# Patient Record
Sex: Female | Born: 1955 | Race: White | Hispanic: No | Marital: Married | Smoking: Current some day smoker
Health system: Southern US, Community
[De-identification: ages and names within clinical notes are randomized; demographics above are authoritative.]

## PROBLEM LIST (undated history)

## (undated) DIAGNOSIS — F419 Anxiety disorder, unspecified: Secondary | ICD-10-CM

## (undated) DIAGNOSIS — H8190 Unspecified disorder of vestibular function, unspecified ear: Secondary | ICD-10-CM

## (undated) DIAGNOSIS — I10 Essential (primary) hypertension: Secondary | ICD-10-CM

## (undated) DIAGNOSIS — F988 Other specified behavioral and emotional disorders with onset usually occurring in childhood and adolescence: Secondary | ICD-10-CM

## (undated) DIAGNOSIS — Z9989 Dependence on other enabling machines and devices: Secondary | ICD-10-CM

## (undated) DIAGNOSIS — K219 Gastro-esophageal reflux disease without esophagitis: Secondary | ICD-10-CM

## (undated) DIAGNOSIS — G4730 Sleep apnea, unspecified: Secondary | ICD-10-CM

## (undated) DIAGNOSIS — Z973 Presence of spectacles and contact lenses: Secondary | ICD-10-CM

## (undated) DIAGNOSIS — Z972 Presence of dental prosthetic device (complete) (partial): Secondary | ICD-10-CM

## (undated) DIAGNOSIS — E669 Obesity, unspecified: Secondary | ICD-10-CM

## (undated) DIAGNOSIS — F329 Major depressive disorder, single episode, unspecified: Secondary | ICD-10-CM

## (undated) DIAGNOSIS — F0781 Postconcussional syndrome: Secondary | ICD-10-CM

## (undated) DIAGNOSIS — H8109 Meniere's disease, unspecified ear: Secondary | ICD-10-CM

## (undated) DIAGNOSIS — T7840XA Allergy, unspecified, initial encounter: Secondary | ICD-10-CM

## (undated) DIAGNOSIS — R7611 Nonspecific reaction to tuberculin skin test without active tuberculosis: Secondary | ICD-10-CM

## (undated) DIAGNOSIS — K5904 Chronic idiopathic constipation: Secondary | ICD-10-CM

## (undated) DIAGNOSIS — F319 Bipolar disorder, unspecified: Secondary | ICD-10-CM

## (undated) DIAGNOSIS — F1190 Opioid use, unspecified, uncomplicated: Secondary | ICD-10-CM

## (undated) DIAGNOSIS — M1990 Unspecified osteoarthritis, unspecified site: Secondary | ICD-10-CM

## (undated) DIAGNOSIS — R1013 Epigastric pain: Secondary | ICD-10-CM

## (undated) DIAGNOSIS — Z860100 Personal history of colon polyps, unspecified: Secondary | ICD-10-CM

## (undated) DIAGNOSIS — R413 Other amnesia: Secondary | ICD-10-CM

## (undated) DIAGNOSIS — E785 Hyperlipidemia, unspecified: Secondary | ICD-10-CM

## (undated) DIAGNOSIS — G510 Bell's palsy: Secondary | ICD-10-CM

## (undated) DIAGNOSIS — F32A Depression, unspecified: Secondary | ICD-10-CM

## (undated) HISTORY — DX: Epigastric pain: R10.13

## (undated) HISTORY — DX: Personal history of colon polyps, unspecified: Z86.0100

## (undated) HISTORY — DX: Nonspecific reaction to tuberculin skin test without active tuberculosis: R76.11

## (undated) HISTORY — DX: Gastro-esophageal reflux disease without esophagitis: K21.9

## (undated) HISTORY — DX: Sleep apnea, unspecified: G47.30

## (undated) HISTORY — DX: Hyperlipidemia, unspecified: E78.5

## (undated) HISTORY — DX: Major depressive disorder, single episode, unspecified: F32.9

## (undated) HISTORY — PX: CATARACT EXTRACTION, BILATERAL: SHX1313

## (undated) HISTORY — PX: UVULOPALATOPHARYNGOPLASTY: SHX827

## (undated) HISTORY — PX: MULTIPLE TOOTH EXTRACTIONS: SHX2053

## (undated) HISTORY — DX: Other amnesia: R41.3

## (undated) HISTORY — DX: Allergy, unspecified, initial encounter: T78.40XA

## (undated) HISTORY — DX: Personal history of colonic polyps: Z86.010

## (undated) HISTORY — DX: Chronic idiopathic constipation: K59.04

## (undated) HISTORY — DX: Obesity, unspecified: E66.9

## (undated) HISTORY — PX: ABDOMINAL HYSTERECTOMY: SHX81

## (undated) HISTORY — DX: Essential (primary) hypertension: I10

## (undated) HISTORY — DX: Postconcussional syndrome: F07.81

## (undated) HISTORY — DX: Depression, unspecified: F32.A

---
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
## 1979-06-19 HISTORY — PX: BREAST EXCISIONAL BIOPSY: SUR124

## 1979-06-19 HISTORY — PX: BREAST BIOPSY: SHX20

## 1995-05-19 HISTORY — PX: VAGINAL HYSTERECTOMY: SUR661

## 1998-01-19 ENCOUNTER — Other Ambulatory Visit: Admission: RE | Admit: 1998-01-19 | Discharge: 1998-01-19 | Payer: Self-pay | Admitting: Obstetrics and Gynecology

## 1998-01-27 ENCOUNTER — Ambulatory Visit (HOSPITAL_COMMUNITY): Admission: RE | Admit: 1998-01-27 | Discharge: 1998-01-27 | Payer: Self-pay | Admitting: Obstetrics and Gynecology

## 1999-08-31 ENCOUNTER — Encounter: Payer: Self-pay | Admitting: Obstetrics and Gynecology

## 1999-08-31 ENCOUNTER — Ambulatory Visit (HOSPITAL_COMMUNITY): Admission: RE | Admit: 1999-08-31 | Discharge: 1999-08-31 | Payer: Self-pay | Admitting: Obstetrics and Gynecology

## 1999-11-10 ENCOUNTER — Other Ambulatory Visit: Admission: RE | Admit: 1999-11-10 | Discharge: 1999-11-10 | Payer: Self-pay | Admitting: Obstetrics and Gynecology

## 2000-09-03 ENCOUNTER — Ambulatory Visit (HOSPITAL_COMMUNITY): Admission: RE | Admit: 2000-09-03 | Discharge: 2000-09-03 | Payer: Self-pay | Admitting: Obstetrics and Gynecology

## 2000-09-03 ENCOUNTER — Encounter: Payer: Self-pay | Admitting: Obstetrics and Gynecology

## 2000-10-17 ENCOUNTER — Ambulatory Visit (HOSPITAL_COMMUNITY): Admission: RE | Admit: 2000-10-17 | Discharge: 2000-10-17 | Payer: Self-pay | Admitting: Orthopedic Surgery

## 2000-10-17 ENCOUNTER — Encounter: Payer: Self-pay | Admitting: Orthopedic Surgery

## 2000-10-30 ENCOUNTER — Encounter: Payer: Self-pay | Admitting: Orthopedic Surgery

## 2000-10-30 ENCOUNTER — Encounter: Admission: RE | Admit: 2000-10-30 | Discharge: 2000-10-30 | Payer: Self-pay | Admitting: Orthopedic Surgery

## 2000-11-13 ENCOUNTER — Encounter: Payer: Self-pay | Admitting: Orthopedic Surgery

## 2000-11-13 ENCOUNTER — Encounter: Admission: RE | Admit: 2000-11-13 | Discharge: 2000-11-13 | Payer: Self-pay | Admitting: Orthopedic Surgery

## 2000-11-27 ENCOUNTER — Encounter: Admission: RE | Admit: 2000-11-27 | Discharge: 2000-11-27 | Payer: Self-pay | Admitting: Internal Medicine

## 2000-11-27 ENCOUNTER — Encounter: Payer: Self-pay | Admitting: Internal Medicine

## 2001-05-06 ENCOUNTER — Ambulatory Visit (HOSPITAL_COMMUNITY): Admission: RE | Admit: 2001-05-06 | Discharge: 2001-05-07 | Payer: Self-pay | Admitting: Neurosurgery

## 2001-05-06 ENCOUNTER — Encounter: Payer: Self-pay | Admitting: Neurosurgery

## 2001-06-18 HISTORY — PX: LUMBAR DISC SURGERY: SHX700

## 2003-02-02 ENCOUNTER — Other Ambulatory Visit: Admission: RE | Admit: 2003-02-02 | Discharge: 2003-02-02 | Payer: Self-pay | Admitting: Obstetrics and Gynecology

## 2003-05-27 ENCOUNTER — Ambulatory Visit (HOSPITAL_COMMUNITY): Admission: RE | Admit: 2003-05-27 | Discharge: 2003-05-27 | Payer: Self-pay | Admitting: Obstetrics and Gynecology

## 2003-11-27 ENCOUNTER — Ambulatory Visit (HOSPITAL_COMMUNITY): Admission: RE | Admit: 2003-11-27 | Discharge: 2003-11-27 | Payer: Self-pay | Admitting: Neurosurgery

## 2004-01-07 ENCOUNTER — Encounter: Admission: RE | Admit: 2004-01-07 | Discharge: 2004-01-07 | Payer: Self-pay | Admitting: Neurosurgery

## 2004-01-21 ENCOUNTER — Encounter: Admission: RE | Admit: 2004-01-21 | Discharge: 2004-01-21 | Payer: Self-pay | Admitting: Neurosurgery

## 2004-02-04 ENCOUNTER — Encounter: Admission: RE | Admit: 2004-02-04 | Discharge: 2004-02-04 | Payer: Self-pay | Admitting: Neurosurgery

## 2004-05-24 ENCOUNTER — Other Ambulatory Visit: Admission: RE | Admit: 2004-05-24 | Discharge: 2004-05-24 | Payer: Self-pay | Admitting: Obstetrics and Gynecology

## 2004-06-18 HISTORY — PX: ANTERIOR FUSION CERVICAL SPINE: SUR626

## 2004-08-09 ENCOUNTER — Encounter: Admission: RE | Admit: 2004-08-09 | Discharge: 2004-08-09 | Payer: Self-pay | Admitting: Obstetrics and Gynecology

## 2004-09-18 ENCOUNTER — Ambulatory Visit (HOSPITAL_COMMUNITY): Admission: RE | Admit: 2004-09-18 | Discharge: 2004-09-18 | Payer: Self-pay | Admitting: Neurosurgery

## 2004-10-19 ENCOUNTER — Encounter: Admission: RE | Admit: 2004-10-19 | Discharge: 2004-10-19 | Payer: Self-pay | Admitting: Neurosurgery

## 2004-11-24 ENCOUNTER — Encounter: Admission: RE | Admit: 2004-11-24 | Discharge: 2004-11-24 | Payer: Self-pay | Admitting: Neurosurgery

## 2005-03-01 ENCOUNTER — Encounter (INDEPENDENT_AMBULATORY_CARE_PROVIDER_SITE_OTHER): Payer: Self-pay | Admitting: *Deleted

## 2005-03-01 ENCOUNTER — Ambulatory Visit (HOSPITAL_COMMUNITY): Admission: RE | Admit: 2005-03-01 | Discharge: 2005-03-02 | Payer: Self-pay | Admitting: Otolaryngology

## 2005-03-27 ENCOUNTER — Encounter: Admission: RE | Admit: 2005-03-27 | Discharge: 2005-03-27 | Payer: Self-pay | Admitting: Neurosurgery

## 2005-08-31 ENCOUNTER — Ambulatory Visit (HOSPITAL_COMMUNITY): Admission: RE | Admit: 2005-08-31 | Discharge: 2005-08-31 | Payer: Self-pay | Admitting: *Deleted

## 2005-11-16 ENCOUNTER — Encounter: Admission: RE | Admit: 2005-11-16 | Discharge: 2005-11-16 | Payer: Self-pay | Admitting: Gastroenterology

## 2006-06-18 DIAGNOSIS — G510 Bell's palsy: Secondary | ICD-10-CM

## 2006-06-18 HISTORY — DX: Bell's palsy: G51.0

## 2006-09-05 ENCOUNTER — Ambulatory Visit (HOSPITAL_COMMUNITY): Admission: RE | Admit: 2006-09-05 | Discharge: 2006-09-05 | Payer: Self-pay | Admitting: Obstetrics & Gynecology

## 2006-11-05 ENCOUNTER — Encounter: Admission: RE | Admit: 2006-11-05 | Discharge: 2006-11-05 | Payer: Self-pay | Admitting: Neurosurgery

## 2007-04-04 ENCOUNTER — Ambulatory Visit: Payer: Self-pay | Admitting: Pulmonary Disease

## 2007-04-15 ENCOUNTER — Ambulatory Visit: Payer: Self-pay | Admitting: Pulmonary Disease

## 2007-04-15 DIAGNOSIS — E785 Hyperlipidemia, unspecified: Secondary | ICD-10-CM | POA: Insufficient documentation

## 2007-04-15 DIAGNOSIS — G4733 Obstructive sleep apnea (adult) (pediatric): Secondary | ICD-10-CM

## 2007-04-15 DIAGNOSIS — R519 Headache, unspecified: Secondary | ICD-10-CM | POA: Insufficient documentation

## 2007-04-15 DIAGNOSIS — R51 Headache: Secondary | ICD-10-CM

## 2007-04-15 DIAGNOSIS — G4730 Sleep apnea, unspecified: Secondary | ICD-10-CM | POA: Insufficient documentation

## 2007-04-15 HISTORY — DX: Obstructive sleep apnea (adult) (pediatric): G47.33

## 2007-04-15 HISTORY — DX: Headache, unspecified: R51.9

## 2007-04-15 LAB — CONVERTED CEMR LAB
Angiotensin 1 Converting Enzyme: 46 units/L (ref 9–67)
Anti Nuclear Antibody(ANA): NEGATIVE
IgE (Immunoglobulin E), Serum: 50.4 intl units/mL (ref 0.0–180.0)
Rhuematoid fact SerPl-aCnc: <20 intl units/mL — ABNORMAL LOW (ref 0.0–20.0)
Sed Rate: 38 mm/hr — ABNORMAL HIGH (ref 0–25)

## 2007-09-08 ENCOUNTER — Ambulatory Visit (HOSPITAL_COMMUNITY): Admission: RE | Admit: 2007-09-08 | Discharge: 2007-09-08 | Payer: Self-pay | Admitting: Obstetrics and Gynecology

## 2008-01-20 ENCOUNTER — Emergency Department (HOSPITAL_COMMUNITY): Admission: EM | Admit: 2008-01-20 | Discharge: 2008-01-20 | Payer: Self-pay | Admitting: Emergency Medicine

## 2008-01-23 ENCOUNTER — Ambulatory Visit: Payer: Self-pay | Admitting: Family Medicine

## 2008-01-23 DIAGNOSIS — K219 Gastro-esophageal reflux disease without esophagitis: Secondary | ICD-10-CM

## 2008-01-23 DIAGNOSIS — F411 Generalized anxiety disorder: Secondary | ICD-10-CM

## 2008-01-23 DIAGNOSIS — F419 Anxiety disorder, unspecified: Secondary | ICD-10-CM | POA: Insufficient documentation

## 2008-01-23 DIAGNOSIS — J309 Allergic rhinitis, unspecified: Secondary | ICD-10-CM | POA: Insufficient documentation

## 2008-01-23 DIAGNOSIS — R32 Unspecified urinary incontinence: Secondary | ICD-10-CM | POA: Insufficient documentation

## 2008-01-23 DIAGNOSIS — I10 Essential (primary) hypertension: Secondary | ICD-10-CM

## 2008-01-23 DIAGNOSIS — F329 Major depressive disorder, single episode, unspecified: Secondary | ICD-10-CM | POA: Insufficient documentation

## 2008-01-23 DIAGNOSIS — G4700 Insomnia, unspecified: Secondary | ICD-10-CM

## 2008-01-23 HISTORY — DX: Insomnia, unspecified: G47.00

## 2008-01-23 HISTORY — DX: Essential (primary) hypertension: I10

## 2008-01-23 HISTORY — DX: Major depressive disorder, single episode, unspecified: F32.9

## 2008-01-23 HISTORY — DX: Allergic rhinitis, unspecified: J30.9

## 2008-01-23 HISTORY — DX: Unspecified urinary incontinence: R32

## 2008-01-23 HISTORY — DX: Gastro-esophageal reflux disease without esophagitis: K21.9

## 2008-01-23 HISTORY — DX: Generalized anxiety disorder: F41.1

## 2008-01-26 ENCOUNTER — Telehealth: Payer: Self-pay | Admitting: Family Medicine

## 2008-01-27 ENCOUNTER — Telehealth: Payer: Self-pay | Admitting: Family Medicine

## 2008-01-28 ENCOUNTER — Emergency Department (HOSPITAL_COMMUNITY): Admission: EM | Admit: 2008-01-28 | Discharge: 2008-01-28 | Payer: Self-pay | Admitting: *Deleted

## 2008-01-29 ENCOUNTER — Inpatient Hospital Stay (HOSPITAL_COMMUNITY): Admission: AD | Admit: 2008-01-29 | Discharge: 2008-02-01 | Payer: Self-pay | Admitting: Family Medicine

## 2008-01-29 ENCOUNTER — Ambulatory Visit: Payer: Self-pay | Admitting: Family Medicine

## 2008-01-30 ENCOUNTER — Ambulatory Visit: Payer: Self-pay | Admitting: Internal Medicine

## 2008-02-04 ENCOUNTER — Telehealth: Payer: Self-pay | Admitting: Family Medicine

## 2008-02-06 ENCOUNTER — Ambulatory Visit: Payer: Self-pay | Admitting: Internal Medicine

## 2008-02-06 ENCOUNTER — Ambulatory Visit: Payer: Self-pay | Admitting: Family Medicine

## 2008-02-06 DIAGNOSIS — G510 Bell's palsy: Secondary | ICD-10-CM | POA: Insufficient documentation

## 2008-02-10 ENCOUNTER — Telehealth: Payer: Self-pay | Admitting: Family Medicine

## 2008-02-13 ENCOUNTER — Ambulatory Visit: Payer: Self-pay | Admitting: Family Medicine

## 2008-02-16 ENCOUNTER — Encounter: Payer: Self-pay | Admitting: Family Medicine

## 2008-02-16 ENCOUNTER — Telehealth: Payer: Self-pay | Admitting: Family Medicine

## 2008-02-27 ENCOUNTER — Ambulatory Visit: Payer: Self-pay | Admitting: Family Medicine

## 2008-03-03 ENCOUNTER — Encounter: Payer: Self-pay | Admitting: Family Medicine

## 2008-03-15 ENCOUNTER — Telehealth: Payer: Self-pay | Admitting: *Deleted

## 2008-03-25 ENCOUNTER — Ambulatory Visit: Payer: Self-pay | Admitting: Licensed Clinical Social Worker

## 2008-04-05 ENCOUNTER — Telehealth: Payer: Self-pay | Admitting: Family Medicine

## 2008-04-12 ENCOUNTER — Encounter: Payer: Self-pay | Admitting: Family Medicine

## 2008-04-12 ENCOUNTER — Telehealth: Payer: Self-pay | Admitting: Family Medicine

## 2008-04-13 ENCOUNTER — Telehealth: Payer: Self-pay | Admitting: Family Medicine

## 2008-04-15 ENCOUNTER — Telehealth: Payer: Self-pay | Admitting: Family Medicine

## 2008-04-16 ENCOUNTER — Telehealth: Payer: Self-pay | Admitting: Family Medicine

## 2008-04-16 ENCOUNTER — Ambulatory Visit: Payer: Self-pay | Admitting: Family Medicine

## 2008-04-16 DIAGNOSIS — R42 Dizziness and giddiness: Secondary | ICD-10-CM | POA: Insufficient documentation

## 2008-04-16 HISTORY — DX: Dizziness and giddiness: R42

## 2008-04-19 ENCOUNTER — Telehealth: Payer: Self-pay | Admitting: Family Medicine

## 2008-04-20 ENCOUNTER — Encounter: Payer: Self-pay | Admitting: Family Medicine

## 2008-04-21 ENCOUNTER — Encounter: Payer: Self-pay | Admitting: Family Medicine

## 2008-04-28 ENCOUNTER — Ambulatory Visit: Payer: Self-pay | Admitting: Licensed Clinical Social Worker

## 2008-05-03 ENCOUNTER — Telehealth: Payer: Self-pay | Admitting: Family Medicine

## 2008-05-03 ENCOUNTER — Encounter: Payer: Self-pay | Admitting: Family Medicine

## 2008-05-26 ENCOUNTER — Ambulatory Visit: Payer: Self-pay | Admitting: Licensed Clinical Social Worker

## 2008-05-26 ENCOUNTER — Encounter: Admission: RE | Admit: 2008-05-26 | Discharge: 2008-06-17 | Payer: Self-pay | Admitting: Neurology

## 2008-06-04 ENCOUNTER — Ambulatory Visit: Payer: Self-pay | Admitting: Licensed Clinical Social Worker

## 2008-06-16 ENCOUNTER — Ambulatory Visit: Payer: Self-pay | Admitting: Internal Medicine

## 2008-07-04 ENCOUNTER — Emergency Department: Payer: Self-pay | Admitting: Emergency Medicine

## 2008-07-06 ENCOUNTER — Ambulatory Visit: Payer: Self-pay | Admitting: Internal Medicine

## 2008-07-12 ENCOUNTER — Ambulatory Visit: Payer: Self-pay | Admitting: Family Medicine

## 2008-07-15 ENCOUNTER — Telehealth: Payer: Self-pay | Admitting: Family Medicine

## 2008-07-21 ENCOUNTER — Encounter: Payer: Self-pay | Admitting: Family Medicine

## 2008-07-22 ENCOUNTER — Emergency Department (HOSPITAL_COMMUNITY): Admission: EM | Admit: 2008-07-22 | Discharge: 2008-07-22 | Payer: Self-pay | Admitting: Emergency Medicine

## 2008-07-22 ENCOUNTER — Inpatient Hospital Stay (HOSPITAL_COMMUNITY): Admission: RE | Admit: 2008-07-22 | Discharge: 2008-07-26 | Payer: Self-pay | Admitting: *Deleted

## 2008-07-23 ENCOUNTER — Ambulatory Visit: Payer: Self-pay | Admitting: *Deleted

## 2008-08-02 ENCOUNTER — Encounter: Admission: RE | Admit: 2008-08-02 | Discharge: 2008-09-06 | Payer: Self-pay | Admitting: Family Medicine

## 2008-08-06 ENCOUNTER — Ambulatory Visit: Payer: Self-pay | Admitting: Family Medicine

## 2008-08-11 ENCOUNTER — Telehealth: Payer: Self-pay | Admitting: Family Medicine

## 2008-08-12 ENCOUNTER — Encounter: Payer: Self-pay | Admitting: Family Medicine

## 2008-08-13 ENCOUNTER — Telehealth: Payer: Self-pay | Admitting: Family Medicine

## 2008-08-27 ENCOUNTER — Telehealth: Payer: Self-pay | Admitting: Family Medicine

## 2008-09-06 ENCOUNTER — Telehealth: Payer: Self-pay | Admitting: Family Medicine

## 2008-09-06 ENCOUNTER — Ambulatory Visit: Payer: Self-pay | Admitting: Family Medicine

## 2008-10-04 ENCOUNTER — Ambulatory Visit: Payer: Self-pay | Admitting: Family Medicine

## 2008-10-04 DIAGNOSIS — L732 Hidradenitis suppurativa: Secondary | ICD-10-CM | POA: Insufficient documentation

## 2008-10-04 HISTORY — DX: Hidradenitis suppurativa: L73.2

## 2008-10-11 ENCOUNTER — Encounter: Payer: Self-pay | Admitting: Family Medicine

## 2008-11-09 ENCOUNTER — Telehealth: Payer: Self-pay | Admitting: Family Medicine

## 2008-11-12 ENCOUNTER — Ambulatory Visit: Payer: Self-pay | Admitting: Family Medicine

## 2008-11-24 ENCOUNTER — Telehealth: Payer: Self-pay | Admitting: Family Medicine

## 2009-01-04 ENCOUNTER — Ambulatory Visit (HOSPITAL_COMMUNITY): Admission: RE | Admit: 2009-01-04 | Discharge: 2009-01-04 | Payer: Self-pay | Admitting: Obstetrics and Gynecology

## 2009-01-13 ENCOUNTER — Telehealth: Payer: Self-pay | Admitting: Family Medicine

## 2009-01-21 ENCOUNTER — Ambulatory Visit: Payer: Self-pay | Admitting: Family Medicine

## 2009-01-21 DIAGNOSIS — M545 Low back pain, unspecified: Secondary | ICD-10-CM | POA: Insufficient documentation

## 2009-01-21 DIAGNOSIS — R609 Edema, unspecified: Secondary | ICD-10-CM

## 2009-01-21 HISTORY — DX: Edema, unspecified: R60.9

## 2009-01-21 HISTORY — DX: Low back pain, unspecified: M54.50

## 2009-02-10 ENCOUNTER — Encounter: Payer: Self-pay | Admitting: Family Medicine

## 2009-02-11 ENCOUNTER — Telehealth: Payer: Self-pay | Admitting: Family Medicine

## 2009-02-14 ENCOUNTER — Encounter: Admission: RE | Admit: 2009-02-14 | Discharge: 2009-02-14 | Payer: Self-pay | Admitting: Neurosurgery

## 2009-02-14 ENCOUNTER — Telehealth: Payer: Self-pay | Admitting: Family Medicine

## 2009-03-01 ENCOUNTER — Encounter: Admission: RE | Admit: 2009-03-01 | Discharge: 2009-03-01 | Payer: Self-pay | Admitting: Neurosurgery

## 2009-04-05 ENCOUNTER — Ambulatory Visit: Payer: Self-pay | Admitting: Family Medicine

## 2009-04-06 ENCOUNTER — Telehealth: Payer: Self-pay | Admitting: Family Medicine

## 2009-04-12 ENCOUNTER — Ambulatory Visit: Payer: Self-pay | Admitting: Family Medicine

## 2009-04-15 ENCOUNTER — Telehealth: Payer: Self-pay | Admitting: Family Medicine

## 2009-04-20 ENCOUNTER — Ambulatory Visit (HOSPITAL_COMMUNITY): Admission: RE | Admit: 2009-04-20 | Discharge: 2009-04-20 | Payer: Self-pay | Admitting: Psychiatry

## 2009-04-22 ENCOUNTER — Encounter: Payer: Self-pay | Admitting: Family Medicine

## 2009-05-10 ENCOUNTER — Ambulatory Visit: Payer: Self-pay | Admitting: Family Medicine

## 2009-05-10 LAB — CONVERTED CEMR LAB
Bilirubin Urine: NEGATIVE
Blood in Urine, dipstick: NEGATIVE
Glucose, Urine, Semiquant: NEGATIVE
Nitrite: NEGATIVE
Specific Gravity, Urine: 1.015
Urobilinogen, UA: 1
WBC Urine, dipstick: NEGATIVE
pH: 7

## 2009-05-11 LAB — CONVERTED CEMR LAB
ALT: 39 units/L — ABNORMAL HIGH (ref 0–35)
AST: 50 units/L — ABNORMAL HIGH (ref 0–37)
Albumin: 3.6 g/dL (ref 3.5–5.2)
Alkaline Phosphatase: 42 units/L (ref 39–117)
BUN: 7 mg/dL (ref 6–23)
Basophils Absolute: 0 10*3/uL (ref 0.0–0.1)
Basophils Relative: 0.8 % (ref 0.0–3.0)
Bilirubin, Direct: 0.1 mg/dL (ref 0.0–0.3)
CO2: 32 meq/L (ref 19–32)
Calcium: 9.3 mg/dL (ref 8.4–10.5)
Chloride: 106 meq/L (ref 96–112)
Cholesterol: 151 mg/dL (ref 0–200)
Creatinine, Ser: 0.6 mg/dL (ref 0.4–1.2)
Direct LDL: 98.8 mg/dL
Eosinophils Absolute: 0.1 10*3/uL (ref 0.0–0.7)
Eosinophils Relative: 3.3 % (ref 0.0–5.0)
GFR calc non Af Amer: 110.77 mL/min (ref >60)
Glucose, Bld: 130 mg/dL — ABNORMAL HIGH (ref 70–99)
HCT: 34.1 % — ABNORMAL LOW (ref 36.0–46.0)
HDL: 34.7 mg/dL — ABNORMAL LOW (ref >39.00)
Hemoglobin: 11.6 g/dL — ABNORMAL LOW (ref 12.0–15.0)
Lymphocytes Relative: 37.2 % (ref 12.0–46.0)
Lymphs Abs: 1.5 10*3/uL (ref 0.7–4.0)
MCHC: 34 g/dL (ref 30.0–36.0)
MCV: 92.6 fL (ref 78.0–100.0)
Monocytes Absolute: 0.4 10*3/uL (ref 0.1–1.0)
Monocytes Relative: 10.4 % (ref 3.0–12.0)
Neutro Abs: 2 10*3/uL (ref 1.4–7.7)
Neutrophils Relative %: 48.3 % (ref 43.0–77.0)
Platelets: 180 10*3/uL (ref 150.0–400.0)
Potassium: 3.7 meq/L (ref 3.5–5.1)
RBC: 3.68 M/uL — ABNORMAL LOW (ref 3.87–5.11)
RDW: 14.6 % (ref 11.5–14.6)
Sodium: 144 meq/L (ref 135–145)
TSH: 4.88 microintl units/mL (ref 0.35–5.50)
Total Bilirubin: 0.6 mg/dL (ref 0.3–1.2)
Total CHOL/HDL Ratio: 4
Total Protein: 7.3 g/dL (ref 6.0–8.3)
Triglycerides: 220 mg/dL — ABNORMAL HIGH (ref 0.0–149.0)
VLDL: 44 mg/dL — ABNORMAL HIGH (ref 0.0–40.0)
Valproic Acid Lvl: 108.4 ug/mL — ABNORMAL HIGH (ref 50.0–100.0)
Vitamin B-12: 510 pg/mL (ref 211–911)
WBC: 4 10*3/uL — ABNORMAL LOW (ref 4.5–10.5)

## 2009-05-16 ENCOUNTER — Encounter (INDEPENDENT_AMBULATORY_CARE_PROVIDER_SITE_OTHER): Payer: Self-pay | Admitting: *Deleted

## 2009-05-31 ENCOUNTER — Telehealth: Payer: Self-pay | Admitting: Family Medicine

## 2009-06-29 ENCOUNTER — Telehealth: Payer: Self-pay | Admitting: Family Medicine

## 2009-07-01 ENCOUNTER — Ambulatory Visit: Payer: Self-pay | Admitting: Family Medicine

## 2009-07-01 DIAGNOSIS — R413 Other amnesia: Secondary | ICD-10-CM | POA: Insufficient documentation

## 2009-07-08 ENCOUNTER — Telehealth: Payer: Self-pay | Admitting: Family Medicine

## 2009-07-11 ENCOUNTER — Telehealth: Payer: Self-pay | Admitting: Family Medicine

## 2009-07-13 ENCOUNTER — Ambulatory Visit: Payer: Self-pay | Admitting: Family Medicine

## 2009-07-14 ENCOUNTER — Encounter: Payer: Self-pay | Admitting: Family Medicine

## 2009-08-02 ENCOUNTER — Encounter: Admission: RE | Admit: 2009-08-02 | Discharge: 2009-10-31 | Payer: Self-pay | Admitting: Psychiatry

## 2009-08-23 ENCOUNTER — Telehealth: Payer: Self-pay | Admitting: Family Medicine

## 2009-09-08 ENCOUNTER — Encounter: Payer: Self-pay | Admitting: Family Medicine

## 2009-10-19 ENCOUNTER — Encounter: Payer: Self-pay | Admitting: Family Medicine

## 2009-12-09 ENCOUNTER — Telehealth: Payer: Self-pay | Admitting: Family Medicine

## 2009-12-13 ENCOUNTER — Ambulatory Visit: Payer: Self-pay | Admitting: Family Medicine

## 2009-12-13 DIAGNOSIS — F319 Bipolar disorder, unspecified: Secondary | ICD-10-CM | POA: Insufficient documentation

## 2009-12-13 HISTORY — DX: Bipolar disorder, unspecified: F31.9

## 2010-01-06 ENCOUNTER — Telehealth: Payer: Self-pay | Admitting: Family Medicine

## 2010-01-11 ENCOUNTER — Ambulatory Visit (HOSPITAL_COMMUNITY): Admission: RE | Admit: 2010-01-11 | Discharge: 2010-01-11 | Payer: Self-pay | Admitting: Obstetrics and Gynecology

## 2010-01-31 ENCOUNTER — Ambulatory Visit: Payer: Self-pay | Admitting: Family Medicine

## 2010-02-09 ENCOUNTER — Telehealth: Payer: Self-pay | Admitting: Family Medicine

## 2010-02-13 ENCOUNTER — Encounter: Payer: Self-pay | Admitting: Family Medicine

## 2010-02-24 ENCOUNTER — Encounter: Payer: Self-pay | Admitting: Family Medicine

## 2010-02-27 ENCOUNTER — Ambulatory Visit: Payer: Self-pay | Admitting: Family Medicine

## 2010-02-28 LAB — CONVERTED CEMR LAB
Amphetamine Screen, Ur: NEGATIVE
Barbiturate Quant, Ur: NEGATIVE
Benzodiazepines.: NEGATIVE
Cocaine Metabolites: NEGATIVE
Creatinine,U: 92 mg/dL
Marijuana Metabolite: NEGATIVE
Methadone: NEGATIVE
Opiate Screen, Urine: NEGATIVE
Phencyclidine (PCP): NEGATIVE
Propoxyphene: NEGATIVE

## 2010-03-23 ENCOUNTER — Ambulatory Visit: Payer: Self-pay | Admitting: Family Medicine

## 2010-04-13 ENCOUNTER — Encounter: Admission: RE | Admit: 2010-04-13 | Discharge: 2010-04-13 | Payer: Self-pay | Admitting: Otolaryngology

## 2010-04-13 ENCOUNTER — Encounter: Payer: Self-pay | Admitting: Family Medicine

## 2010-04-17 ENCOUNTER — Ambulatory Visit: Payer: Self-pay | Admitting: Family Medicine

## 2010-06-23 ENCOUNTER — Telehealth: Payer: Self-pay | Admitting: Family Medicine

## 2010-07-04 ENCOUNTER — Telehealth: Payer: Self-pay | Admitting: Family Medicine

## 2010-07-09 ENCOUNTER — Encounter: Payer: Self-pay | Admitting: Obstetrics and Gynecology

## 2010-07-09 ENCOUNTER — Encounter: Payer: Self-pay | Admitting: Neurosurgery

## 2010-07-12 ENCOUNTER — Ambulatory Visit
Admission: RE | Admit: 2010-07-12 | Discharge: 2010-07-12 | Payer: Self-pay | Source: Home / Self Care | Attending: Family Medicine | Admitting: Family Medicine

## 2010-07-13 ENCOUNTER — Encounter
Admission: RE | Admit: 2010-07-13 | Discharge: 2010-07-13 | Payer: Self-pay | Source: Home / Self Care | Attending: Neurosurgery | Admitting: Neurosurgery

## 2010-07-17 ENCOUNTER — Encounter
Admission: RE | Admit: 2010-07-17 | Discharge: 2010-07-17 | Payer: Self-pay | Source: Home / Self Care | Attending: Neurosurgery | Admitting: Neurosurgery

## 2010-07-19 ENCOUNTER — Other Ambulatory Visit: Payer: Self-pay | Admitting: Neurosurgery

## 2010-07-19 DIAGNOSIS — M7918 Myalgia, other site: Secondary | ICD-10-CM

## 2010-07-19 DIAGNOSIS — M545 Low back pain, unspecified: Secondary | ICD-10-CM

## 2010-07-19 DIAGNOSIS — M25551 Pain in right hip: Secondary | ICD-10-CM

## 2010-07-19 DIAGNOSIS — M25552 Pain in left hip: Secondary | ICD-10-CM

## 2010-07-20 NOTE — Assessment & Plan Note (Signed)
Summary: continued problems w vertigo   Vital Signs:  Patient Profile:   55 Years Old Female Height:     62.5 inches Weight:      166 pounds Temp:     97.5 degrees F oral BP sitting:   114 / 74  (left arm) Cuff size:   regular  Vitals Entered By: Alfred Levins, CMA (August 06, 2008 11:07 AM)                 Chief Complaint:  f/u.  History of Present Illness: Here to follow up for chronic dizziness. She saw Dr. Gentry Roch, a neuro-ophthalmologist at [facility], on 07-21-08 for her reports of diplopia. Her exam was normal. he did not have much to offer, but did raise the outside posssibilty of ocular mysthenia gravis. He then turned her care back over to Dr. Anne Hahn. After I saw her last month, we refered her to PT for vestibular rehab therapy. She has only had 2 sessions so far, so of course there has not been much improvement. She remains out of work and still does not drive. Her daughter drove her today. By far the major problem she is dealing with now is the chronic dizziness. By her description this seems to be more from a central CNS source than from a vestibular source, but nothing specific has been found as of yet. Her depression is much better lately, and she is pleased with her current regimen of meds. She would like to have me prescribe her psychiatric meds from now on, since she does not plan on seeing Dr. Donell Beers any more. She continues with regular psychotherapy as well.     Current Allergies: SULFA  Past Medical History:    Reviewed history from 04/16/2008 and no changes required:       Hyperlipidemia       Allergic rhinitis       depression and anxiety, sees Dr. Donell Beers       GERD       Headache       Hypertension       Positive PPD       Urinary incontinence       sleep apnea, sees Dr. Shelle Iron       sees Dr. Tresa Res for gyn exams       sees Berniece Andreas for therapy       sees Dr. Anne Hahn for dizziness, left sided hearing loss, and left sided head pains          Past Surgical History:    Reviewed history from 01/23/2008 and no changes required:       Breast biopsy       Hysterectomy       Lower lumbar 2003       cervical spine fusion 2006       colonoscopy per Dr. Elsie Amis, normal       UPPP per Dr. Haroldine Laws     Review of Systems  The patient denies anorexia, fever, weight loss, weight gain, vision loss, decreased hearing, hoarseness, chest pain, syncope, dyspnea on exertion, peripheral edema, prolonged cough, headaches, hemoptysis, abdominal pain, melena, hematochezia, severe indigestion/heartburn, hematuria, incontinence, genital sores, muscle weakness, suspicious skin lesions, transient blindness, difficulty walking, depression, unusual weight change, abnormal bleeding, enlarged lymph nodes, angioedema, breast masses, and testicular masses.     Physical Exam  General:     Well-developed,well-nourished,in no acute distress; alert,appropriate and cooperative throughout examination Neurologic:     alert & oriented X3  and gait normal.   Psych:     Cognition and judgment appear intact. Alert and cooperative with normal attention span and concentration. No apparent delusions, illusions, hallucinations    Impression & Recommendations:  Problem # 1:  DIZZINESS (ICD-780.4)  Her updated medication list for this problem includes:    Meclizine Hcl 25 Mg Tabs (Meclizine hcl) .[name]... 1 every 4 hours as needed dizziness   Problem # 2:  ANXIETY (ICD-300.00)  The following medications were removed from the medication list:    Alprazolam 2 Mg Tabs (Alprazolam) .[name]..[name] Two times a day as needed  Her updated medication list for this problem includes:    Effexor Xr 75 Mg Xr24h-cap (Venlafaxine hcl) .Marland KitchenMarland KitchenMarland KitchenMarland Kitchen 3  tabs once daily    Alprazolam 1 Mg Tabs (Alprazolam) .[name]... Three times a day as needed   Problem # 3:  HYPERTENSION (ICD-401.9)  Problem # 4:  DEPRESSION (ICD-311)  The following medications were removed from the medication list:     Alprazolam 2 Mg Tabs (Alprazolam) .[name]..[name] Two times a day as needed  Her updated medication list for this problem includes:    Effexor Xr 75 Mg Xr24h-cap (Venlafaxine hcl) .Marland KitchenMarland KitchenMarland KitchenMarland Kitchen 3  tabs once daily    Alprazolam 1 Mg Tabs (Alprazolam) .[name]... Three times a day as needed   Complete Medication List: 1)  Vytorin 10-40 Mg Tabs (Ezetimibe-simvastatin) .... Take one tab by mouth once daily 2)  Omeprazole 20 Mg Cpdr (Omeprazole) .... One by mouth two times a day 3)  Effexor Xr 75 Mg Xr24h-cap (Venlafaxine hcl) .... 3  tabs once daily 4)  Alprazolam 1 Mg Tabs (Alprazolam) .... Three times a day as needed 5)  Darvocet-n 100 100-650 Mg Tabs (Propoxyphene n-apap) .[name].. 1 every 4 hours as needed pain 6)  Meclizine Hcl 25 Mg Tabs (Meclizine hcl) .[name].. 1 every 4 hours as needed dizziness 7)  Depakote 250 Mg Tbec (Divalproex sodium) .... 3 tabs at bedtime   Patient Instructions: 1)  Please schedule a follow-up appointment in 1 month. 2)  I told her that I could take over prescribing her psychiatric meds.  3)  Continue rehab

## 2010-07-20 NOTE — Assessment & Plan Note (Signed)
Summary: FUP/DISCUSS MRI RESULTS/PT BRINGING MRI WITH HER/CJR   Vital Signs:  Patient profile:   55 year old female Weight:      179 pounds O2 Sat:      97 % Temp:     98.2 degrees F Pulse rate:   105 / minute BP sitting:   120 / 74  (left arm) Cuff size:   large  Vitals Entered By: Pura Spice, RN (July 12, 2010 3:54 PM) CC: discuss MRI  c/o unsteady walking dizziness   History of Present Illness: Here for follow up of chronic dizziness and memory issues. She is doing very well on her current regimen with Dr. Dub Mikes, and she enjoys her sessions with Forrest Moron. She is still unable to work however, and Dr. Dub Mikes has been supporting her efforts to get disability. She actually has a disability hearing coming up on 09-17-10. She has been getting paychecks as a current employee at her job even though she is not working, and it took the efforts of an attorney to get this accomplished. She is seeing Dr. Wynetta Emery for lower back pain, and she is set to get an MRI of the lumbar spine next week. She is asking to be referred to a new neurologist to help with the dizziness and memory problems, and she would like to see someone in [facility] if possible. As I requested, she has located the brain MRI scan that she had taken on 04-17-10 and brought it with her today. There is no radiologic interpretation however.   Allergies: 1)  Sulfa  Past History:  Past Medical History: Hyperlipidemia Allergic rhinitis depression and anxiety, sees Forrest Moron for therapy and Dr. Geoffery Lyons for psychiatric care  GERD Headache Hypertension Positive PPD Urinary incontinence sleep apnea, sees Dr. Shelle Iron sees Dr. Tresa Res for gyn exams chronic dizziness, weakness, and memory loss, sees Dr. Annabell Sabal at [facility] and Dr. Lesia Sago   Past Surgical History: Breast biopsy Hysterectomy Lower lumbar surgery 2003 per Dr. Wynetta Emery cervical spine fusion 2006 per Dr. Donalee Citrin colonoscopy per Dr. Elsie Amis,  normal UPPP per Dr. Haroldine Laws  Family History: Reviewed history from 01/23/2008 and no changes required. Family History of Alcoholism/Addiction Family History of Arthritis Family History of CAD Female 1st degree relative <50 Family History Depression Family History Diabetes 1st degree relative Family History High cholesterol Family History Hypertension Family History of Sudden Death dementia in mother  Review of Systems  The patient denies anorexia, fever, weight loss, weight gain, vision loss, decreased hearing, hoarseness, chest pain, syncope, dyspnea on exertion, peripheral edema, prolonged cough, headaches, hemoptysis, abdominal pain, melena, hematochezia, severe indigestion/heartburn, hematuria, incontinence, genital sores, muscle weakness, suspicious skin lesions, transient blindness, difficulty walking, depression, unusual weight change, abnormal bleeding, enlarged lymph nodes, angioedema, breast masses, and testicular masses.    Physical Exam  General:  Well-developed,well-nourished,in no acute distress; alert,appropriate and cooperative throughout examination Neck:  No deformities, masses, or tenderness noted. Lungs:  Normal respiratory effort, chest expands symmetrically. Lungs are clear to auscultation, no crackles or wheezes. Heart:  Normal rate and regular rhythm. S1 and S2 normal without gallop, murmur, click, rub or other extra sounds. Neurologic:  alert & oriented X3, cranial nerves II-XII intact, strength normal in all extremities, and gait normal.   Psych:  Cognition and judgment appear intact. Alert and cooperative with normal attention span and concentration. No apparent delusions, illusions, hallucinations   Impression & Recommendations:  Problem # 1:  BIPOLAR AFFECTIVE DISORDER (ICD-296.80)  Problem # 2:  MEMORY LOSS (ICD-780.93)  Orders: Neurology Referral (Neuro)  Problem # 3:  BACK PAIN, LUMBAR (ICD-724.2)  Her updated medication list for this problem  includes:    Vicodin Hp 10-660 Mg Tabs (Hydrocodone-acetaminophen) .[name]... Three times a day as needed pain  Problem # 4:  HYPERTENSION (ICD-401.9)  Her updated medication list for this problem includes:    Furosemide 20 Mg Tabs (Furosemide) ..... Once daily as needed fluid  Complete Medication List: 1)  Omeprazole 20 Mg Cpdr (Omeprazole) .... One by mouth two times a day 2)  Alprazolam 2 Mg Tabs (Alprazolam) .... Three times a day 3)  Vicodin Hp 10-660 Mg Tabs (Hydrocodone-acetaminophen) .... Three times a day as needed pain 4)  Lamictal 100mg  Mg Tabs (lamotrigine)  .... Once daily 5)  Simvastatin 40 Mg Tabs (Simvastatin) .... Once daily 6)  Seroquel Xr 200 Mg Xr24h-tab (Quetiapine fumarate) .... One at bedtime 7)  Exefen-ir 60-400 Mg Tabs (Pseudoephedrine-guaifenesin) .... 1/2 tab q am 8)  Vitamin D (ergocalciferol) 50000 Unit Caps (Ergocalciferol) .... Twice a month 9)  Furosemide 20 Mg Tabs (Furosemide) .... Once daily as needed fluid  Patient Instructions: 1)  We will refer her to [facility] in [facility], and I advised her to take the MRI films with her.    Orders Added: 1)  Est. Patient Level IV [04540] 2)  Neurology Referral [Neuro]

## 2010-07-20 NOTE — Progress Notes (Signed)
Summary: refils  Phone Note From Pharmacy   Caller: [facility] Reason for Call: Needs renewal Details for Reason: Hydrocodone/apap 10/660, furosemide 20mg , alprazolam 2mg  Summary of Call: #90 last filled on 12/5/11on hydrocodone, last filled on alprazolam 2mg  on 05/22/10 #90 Initial call taken by: Romualdo Bolk, CMA (AAMA),  June 23, 2010 4:38 PM  Follow-up for Phone Call        both called to [facility] pharmacy  pt aware.  Follow-up by: Pura Spice, RN,  June 26, 2010 8:41 AM

## 2010-07-20 NOTE — Progress Notes (Signed)
Summary: Pt checking on status of faxed insurance paperwork  Phone Note Call from Patient Call back at [phone] ext [phone] or [phone] [name] [name]   Caller: Daughter- [name] [name] Summary of Call: Pts daughter called and wanted to check to see if Dr. Clent Ridges rcvd a fax of insurance papers that Dr. Clent Ridges needs to complete. Please call pts daughter, [name] [name].  Initial call taken by: Lucy Antigua,  June 29, 2009 3:43 PM  Follow-up for Phone Call        we received a medical release form on 06/21/09 for transfer of care.  Pt said she does not know want to leave Dr Clent Ridges and she just signed the form so they could see her HGBA1C.  Do you want me to go ahead and fill out the form or do you want her to keep her appt on Friday? Follow-up by: Alfred Levins, CMA,  June 29, 2009 4:50 PM  Additional Follow-up for Phone Call Additional follow up Details #1::        keep the friday appt. with [place] so we can discuss all this Additional Follow-up by: Nelwyn Salisbury MD,  June 29, 2009 5:05 PM    Additional Follow-up for Phone Call Additional follow up Details #2::    pt aware Follow-up by: Alfred Levins, CMA,  July 01, 2009 8:26 AM

## 2010-07-20 NOTE — Progress Notes (Signed)
Summary: [employer] called. Please refax Dissability form  Phone Note Other Incoming Call back at [phone] ext [phone] Nigel Mormon at [employer]   Caller: [employer] - Nigel Mormon Summary of Call: [facility] called and said that the Dissability form was faxed back to them, but not all pages were rcvd. Please refax all pages to [phone]. Initial call taken by: Lucy Antigua,  July 08, 2009 10:47 AM  Follow-up for Phone Call        Dr Clent Ridges has filled out all he is going to fill out on her disability.  We faxed last office note to them.  They will need to contact her Neurologist Dr Merlinda Frederick if they need additional information at [facility] phone number is [phone] Follow-up by: Alfred Levins, CMA,  July 08, 2009 3:41 PM  Additional Follow-up for Phone Call Additional follow up Details #1::        Phone call completed-----Notification faxed relaying info concerning disability documents...Marland KitchenMarland KitchenInformation concerning pt faxed to [facility] @ [facility] @ number provided: [phone]...Marland KitchenMsg relayed: Last office note has been faxed to [facility] along w/ other paperwork. They will need to contact pts Neurologist, Dr Merlinda Frederick at [facility] for further @ [phone]. Additional Follow-up by: Debbra Riding,  July 08, 2009 4:18 PM

## 2010-07-20 NOTE — Letter (Signed)
Summary: Psychiatric Assessment/Parish Nolen Mu, MD  Psychiatric Assessment/Parish Nolen Mu, MD   Imported By: Maryln Gottron 02/17/2010 09:45:23  _____________________________________________________________________  External Attachment:    Type:   Image     Comment:   External Document

## 2010-07-20 NOTE — Progress Notes (Signed)
Summary: refill alprazolam  Phone Note Refill Request Message from:  Fax from Pharmacy on January 06, 2010 9:58 AM  Refills Requested: Medication #1:  ALPRAZOLAM 1 MG  TABS three times a day as needed   Dosage confirmed as above?Dosage Confirmed   Supply Requested: 1 month   Last Refilled: 12/05/2009  Method Requested: Fax to Local Pharmacy Initial call taken by: Raechel Ache, RN,  January 06, 2010 9:58 AM [facility]  [address] [phone]*  Follow-up for Phone Call        call in #90 with 5 rf Follow-up by: Nelwyn Salisbury MD,  January 06, 2010 12:57 PM    Prescriptions: ALPRAZOLAM 1 MG  TABS (ALPRAZOLAM) three times a day as needed  #90 x 5   Entered by:   Raechel Ache, RN   Authorized by:   Nelwyn Salisbury MD   Signed by:   Raechel Ache, RN on 01/06/2010   Method used:   Historical   RxID:   [id number]

## 2010-07-20 NOTE — Letter (Signed)
Summary: FMLA form  FMLA form   Imported By: Kassie Mends 03/01/2008 11:41:45  _____________________________________________________________________  External Attachment:    Type:   Image     Comment:   FMLA form

## 2010-07-20 NOTE — Letter (Signed)
Summary: ENT-Dr. Hermelinda Medicus  ENT-Dr. Hermelinda Medicus   Imported By: Maryln Gottron 04/21/2010 14:56:15  _____________________________________________________________________  External Attachment:    Type:   Image     Comment:   External Document

## 2010-07-20 NOTE — Progress Notes (Signed)
Summary: Pt called to check on status of refills Alprazolam and Hydrocodo  Phone Note Call from Patient Call back at [phone]   Caller: Patient Summary of Call: Pt called to check on status of refills from [facility] for Alprazolam and Hydrocodone.  Initial call taken by: Lucy Antigua,  June 23, 2010 1:54 PM  Follow-up for Phone Call        Spoke to daughter and left a message saying that we never got a refill request on these medications. I also told them it would be monday before we could call in rx because we have to get auth. by md. Pt's daughter states that this is okay. Follow-up by: Romualdo Bolk, CMA (AAMA),  June 23, 2010 4:20 PM  Additional Follow-up for Phone Call Additional follow up Details #1::        call in #90 with 5 rf for both of these  Additional Follow-up by: Nelwyn Salisbury MD,  June 26, 2010 8:34 AM    Prescriptions: VICODIN HP 10-660 MG TABS (HYDROCODONE-ACETAMINOPHEN) three times a day as needed pain  #90 x 5   Entered by:   Pura Spice, RN   Authorized by:   Nelwyn Salisbury MD   Signed by:   Pura Spice, RN on 06/26/2010   Method used:   Telephoned to ...       [facility]* (retail)       [address]       [place], [place]  [address]       Ph: [phone]       Fax: [phone]   RxID:   [id number] ALPRAZOLAM 2 MG TABS (ALPRAZOLAM) three times a day  #90 x 5   Entered by:   Pura Spice, RN   Authorized by:   Nelwyn Salisbury MD   Signed by:   Pura Spice, RN on 06/26/2010   Method used:   Telephoned to ...       [facility]* (retail)       [address]       [place], [place]  [address]       Ph: [phone]       Fax: [phone]   RxID:   [id number]

## 2010-07-20 NOTE — Progress Notes (Signed)
Summary: Pt req antibiotic for mersa  Phone Note Call from Patient Call back at Home Phone [phone]   Caller: Patient Summary of Call: Pt called and said that she has gotten mersa again. Pt is wondering if Dr. Clent Ridges wants to call in an antibiotic that takes less time than 30 days. Please call in to [facility] [phone] Initial call taken by: Lucy Antigua,  July 11, 2009 3:22 PM  Follow-up for Phone Call        I cannot treat this without an OV Follow-up by: Nelwyn Salisbury MD,  July 12, 2009 9:19 AM  Additional Follow-up for Phone Call Additional follow up Details #1::        I called pt and sch ov for 07/13/09 at 10:30am as noted above.  Additional Follow-up by: Lucy Antigua,  July 12, 2009 10:40 AM

## 2010-07-20 NOTE — Progress Notes (Signed)
Summary: refill hydrocodone  Phone Note From Pharmacy   [facility]  [address] [phone]* Call For: fry  Summary of Call: refill hydrocodone 10/660 1 by mouth every 6 hours as needed for pain Initial call taken by: Alfred Levins, CMA,  August 23, 2009 5:45 PM  Follow-up for Phone Call        call in #120 with 2 rf Follow-up by: Nelwyn Salisbury MD,  August 24, 2009 11:30 AM  Additional Follow-up for Phone Call Additional follow up Details #1::        Phone call completed, Pharmacist called Additional Follow-up by: Alfred Levins, CMA,  August 24, 2009 11:38 AM    Prescriptions: HYDROCODONE-ACETAMINOPHEN 10-660 MG TABS (HYDROCODONE-ACETAMINOPHEN) 1 by mouth every 6 hours as needed for pain  #120 x 2   Entered by:   Alfred Levins, CMA   Authorized by:   Nelwyn Salisbury MD   Signed by:   Alfred Levins, CMA on 08/24/2009   Method used:   Telephoned to ...       [facility]  [address] [phone]* (retail)       [address]       [place], [place]  [id number]       Ph: [phone] or [phone]       Fax: [phone]   RxID:   [id number]

## 2010-07-20 NOTE — Progress Notes (Signed)
Summary: call back  Phone Note Call from Patient   Summary of Call: Valinda Hoar NP Dr. Ann Maki Mckinney's office needs to speak to Dr. 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 They have found out she is taking them from our office.  Please call [phone] Initial call taken by: Lynann Beaver CMA,  February 09, 2010 2:25 PM  Follow-up for Phone Call        I did speak with Valinda Hoar, and she will send me their office notes as well as a DC Summary from [facility].  Follow-up by: Nelwyn Salisbury MD,  February 10, 2010 2:14 PM

## 2010-07-20 NOTE — Letter (Signed)
Summary: [facility]  [facility]   Imported By: Maryln Gottron 09/22/2009 15:44:41  _____________________________________________________________________  External Attachment:    Type:   Image     Comment:   External Document

## 2010-07-20 NOTE — Progress Notes (Signed)
Summary: refill xanax  Phone Note From Pharmacy   [facility]  [address] [phone]* Call For: [name] [name]  Summary of Call: refill xanax 1mg  1 by mouth three times a day as needed Initial call taken by: Alfred Levins, CMA,  July 08, 2009 1:58 PM  Follow-up for Phone Call        call in #90 with 5 rf Follow-up by: Nelwyn Salisbury MD,  July 08, 2009 3:26 PM  Additional Follow-up for Phone Call Additional follow up Details #1::        Phone call completed, Pharmacist called Additional Follow-up by: Alfred Levins, CMA,  July 08, 2009 3:26 PM    Prescriptions: ALPRAZOLAM 1 MG  TABS (ALPRAZOLAM) three times a day as needed  #90 x 5   Entered by:   Alfred Levins, CMA   Authorized by:   Nelwyn Salisbury MD   Signed by:   Alfred Levins, CMA on 07/08/2009   Method used:   Telephoned to ...       [facility]  [address] [phone]* (retail)       [address]       [place], [place]  [id number]       Ph: [phone] or [phone]       Fax: [phone]   RxID:   [id number]

## 2010-07-20 NOTE — Assessment & Plan Note (Signed)
Summary: F/U FROM EVAL AT [facility] // RS---PTS [facility] // RS   Vital Signs:  Patient profile:   55 year old female Weight:      193 pounds Temp:     97.7 degrees F oral Pulse rate:   96 / minute BP sitting:   162 / 100  (left arm) Cuff size:   large  Vitals Entered By: Alfred Levins, CMA (July 01, 2009 10:04 AM) CC: f/u from neurology   History of Present Illness: Here with her daughter for sinus symptoms and to follow up her chronic neurologic problems. For the past week she has had sinus pressure, PND, and a ST. No fever or cough. She is still struggling with dizziness, weakness, and memory loss. She saw Dr. Annabell Sabal at [facility] one time on 05-17-09, but due to bad weather she has not been able to see her after that. She is scheduled to follow up with Dr. Orlene Erm on 07-14-09.   Current Medications (verified): 1)  Simvastatin 20 Mg Tabs (Simvastatin) .[name].. 1 By Mouth At Bedtime 2)  Omeprazole 20 Mg Cpdr (Omeprazole) .... One By Mouth Two Times A Day 3)  Alprazolam 1 Mg  Tabs (Alprazolam) .... Three Times A Day As Needed 4)  Depakote 250 Mg Tbec (Divalproex Sodium) .... 2 Tabs At Bedtime 5)  Hydrocodone-Acetaminophen 10-660 Mg Tabs (Hydrocodone-Acetaminophen) .[name].. 1 By Mouth Every 6 Hours As Needed For Pain 6)  Lasix 20 Mg Tabs (Furosemide) .... Once Daily As Needed Fluid 7)  Ambien 10 Mg Tabs (Zolpidem Tartrate) .... One By Mouth Q Hs 8)  Fluconazole 150 Mg Tabs (Fluconazole) .... Once Daily 9)  Freestyle Lite Test  Strp (Glucose Blood) .... Test Once Daily  Allergies (verified): 1)  Sulfa  Past History:  Past Medical History: Hyperlipidemia Allergic rhinitis depression and anxiety, sees Valinda Hoar NP at the office of Dr. Emerson Monte GERD Headache Hypertension Positive PPD Urinary incontinence sleep apnea, sees Dr. Shelle Iron sees Dr. Tresa Res for gyn exams sees Berniece Andreas for therapy chronic dizziness, weakness, and memory loss, sees Dr. Annabell Sabal at [facility]  Neurology  Past Surgical History: Reviewed history from 01/21/2009 and no changes required. Breast biopsy Hysterectomy Lower lumbar 2003 cervical spine fusion 2006 per Dr. Donalee Citrin colonoscopy per Dr. Elsie Amis, normal UPPP per Dr. Haroldine Laws  Review of Systems  The patient denies anorexia, fever, weight loss, weight gain, vision loss, hoarseness, chest pain, syncope, dyspnea on exertion, peripheral edema, prolonged cough, hemoptysis, abdominal pain, melena, hematochezia, severe indigestion/heartburn, hematuria, incontinence, genital sores, muscle weakness, suspicious skin lesions, transient blindness, difficulty walking, depression, unusual weight change, abnormal bleeding, enlarged lymph nodes, angioedema, breast masses, and testicular masses.    Physical Exam  General:  alert but weak, unsteady on her feet, using a cane to walk. She converses normally but it does take some time for her to process information and answer my questions.  Head:  Normocephalic and atraumatic without obvious abnormalities. No apparent alopecia or balding. Eyes:  No corneal or conjunctival inflammation noted. EOMI. Perrla. Funduscopic exam benign, without hemorrhages, exudates or papilledema. Vision grossly normal. Ears:  External ear exam shows no significant lesions or deformities.  Otoscopic examination reveals clear canals, tympanic membranes are intact bilaterally without bulging, retraction, inflammation or discharge. Hearing is grossly normal bilaterally. Nose:  External nasal examination shows no deformity or inflammation. Nasal mucosa are pink and moist without lesions or exudates. Mouth:  Oral mucosa and oropharynx without lesions or exudates.  Teeth in good  repair. Neck:  No deformities, masses, or tenderness noted. Lungs:  Normal respiratory effort, chest expands symmetrically. Lungs are clear to auscultation, no crackles or wheezes. Neurologic:  alert & oriented X3 and cranial nerves II-XII intact.      Impression & Recommendations:  Problem # 1:  ACUTE SINUSITIS, UNSPECIFIED (ICD-461.9)  Her updated medication list for this problem includes:    Zithromax Z-pak 250 Mg Tabs (Azithromycin) .[name]... As directed    Nasonex 50 Mcg/act Susp (Mometasone furoate) .[name]... 2 sprays each nostril once daily  Problem # 2:  DEPRESSION (ICD-311)  Her updated medication list for this problem includes:    Alprazolam 1 Mg Tabs (Alprazolam) .[name]... Three times a day as needed  Problem # 3:  WEAKNESS (ICD-780.79)  Problem # 4:  DIZZINESS (ICD-780.4)  Problem # 5:  MEMORY LOSS (ICD-780.93)  Complete Medication List: 1)  Simvastatin 20 Mg Tabs (Simvastatin) .[name].. 1 by mouth at bedtime 2)  Omeprazole 20 Mg Cpdr (Omeprazole) .... One by mouth two times a day 3)  Alprazolam 1 Mg Tabs (Alprazolam) .... Three times a day as needed 4)  Depakote 250 Mg Tbec (Divalproex sodium) .... 2 tabs at bedtime 5)  Hydrocodone-acetaminophen 10-660 Mg Tabs (Hydrocodone-acetaminophen) .[name].. 1 by mouth every 6 hours as needed for pain 6)  Lasix 20 Mg Tabs (Furosemide) .... Once daily as needed fluid 7)  Ambien 10 Mg Tabs (Zolpidem tartrate) .... One by mouth q hs 8)  Fluconazole 150 Mg Tabs (Fluconazole) .... Once daily 9)  Freestyle Lite Test Strp (Glucose blood) .... Test once daily 10)  Zithromax Z-pak 250 Mg Tabs (Azithromycin) .... As directed 11)  Nasonex 50 Mcg/act Susp (Mometasone furoate) .... 2 sprays each nostril once daily  Patient Instructions: 1)  I will certify that she is totally disabled from any employment until 07-14-09, when she sees Dr. Orlene Erm again. From that day forward any decisions regarding her disability status will be made by Dr. Orlene Erm. She will follow up with me as needed . Prescriptions: NASONEX 50 MCG/ACT SUSP (MOMETASONE FUROATE) 2 sprays each nostril once daily  #30 x 11   Entered and Authorized by:   Nelwyn Salisbury MD   Signed by:   Nelwyn Salisbury MD on 07/01/2009   Method used:    Electronically to        [facility]  [address] [phone]* (retail)       [address]       [place], [place]  [address]       Ph: [phone] or [phone]       Fax: [phone]   RxID:   [id number] ZITHROMAX Z-PAK 250 MG TABS (AZITHROMYCIN) as directed  #1 x 0   Entered and Authorized by:   Nelwyn Salisbury MD   Signed by:   Nelwyn Salisbury MD on 07/01/2009   Method used:   [facility]        [facility]  [address] [phone]* (retail)       [address]       [place], [place]  [address]       Ph: [phone] or [phone]       Fax: [phone]   RxID:   [id number] FLUCONAZOLE 150 MG TABS (FLUCONAZOLE) once daily  #1 x 11   Entered and Authorized by:   Nelwyn Salisbury MD   Signed by:  Nelwyn Salisbury MD on 07/01/2009   Method used:   [facility]  [address] [phone]* (retail)       [address]       [place], [place]  [address]       Ph: [phone] or [phone]       Fax: [phone]   RxID:   [id number]

## 2010-07-20 NOTE — Assessment & Plan Note (Signed)
Summary: fup meds---flu shot//ccm   Vital Signs:  Patient profile:   55 year old female Weight:      164 pounds O2 Sat:      96 % Temp:     97.9 degrees F Pulse rate:   85 / minute BP sitting:   110 / 78  (left arm) Cuff size:   regular  Vitals Entered By: Pura Spice, RN (March 23, 2010 10:13 AM) CC: discuss meds  wants citaprolam changed not working    History of Present Illness: here for med refills. She no longer sees Vernell Morgans or Dr. Nolen Mu, so she is asking me to write for some meds. She is having a lot of anxiety, and the Celexa she is on is not helping. She did well with Prozac and Xanax in the past. Her back pain is worse, and she would like some pain meds again.   Allergies: 1)  Sulfa  Past History:  Past Medical History: Reviewed history from 07/01/2009 and no changes required. Hyperlipidemia Allergic rhinitis depression and anxiety, sees Valinda Hoar NP at the office of Dr. Emerson Monte GERD Headache Hypertension Positive PPD Urinary incontinence sleep apnea, sees Dr. Shelle Iron sees Dr. Tresa Res for gyn exams sees Berniece Andreas for therapy chronic dizziness, weakness, and memory loss, sees Dr. Annabell Sabal at [facility]  Past Surgical History: Reviewed history from 01/21/2009 and no changes required. Breast biopsy Hysterectomy Lower lumbar 2003 cervical spine fusion 2006 per Dr. Donalee Citrin colonoscopy per Dr. Elsie Amis, normal UPPP per Dr. Haroldine Laws  Review of Systems  The patient denies anorexia, fever, weight loss, weight gain, vision loss, decreased hearing, hoarseness, chest pain, syncope, dyspnea on exertion, peripheral edema, prolonged cough, headaches, hemoptysis, abdominal pain, melena, hematochezia, severe indigestion/heartburn, hematuria, incontinence, genital sores, muscle weakness, suspicious skin lesions, transient blindness, difficulty walking, depression, unusual weight change, abnormal bleeding, enlarged lymph nodes, angioedema,  breast masses, and testicular masses.         Flu Vaccine Consent Questions     Do you have a history of severe allergic reactions to this vaccine? no    Any prior history of allergic reactions to egg and/or gelatin? no    Do you have a sensitivity to the preservative Thimersol? no    Do you have a past history of Guillan-Barre Syndrome? no    Do you currently have an acute febrile illness? no    Have you ever had a severe reaction to latex? no    Vaccine information given and explained to patient? yes    Are you currently pregnant? no    Lot Number:[id number]   Exp Date:12/16/2010   Site Given  Left Deltoid IM Pura Spice, RN  March 23, 2010 10:14 AM   Physical Exam  General:  Well-developed,well-nourished,in no acute distress; alert,appropriate and cooperative throughout examination Msk:  No deformity or scoliosis noted of thoracic or lumbar spine.   Psych:  Oriented X3, memory intact for recent and remote, normally interactive, good eye contact, and moderately anxious.     Impression & Recommendations:  Problem # 1:  BIPOLAR AFFECTIVE DISORDER (ICD-296.80)  Problem # 2:  BACK PAIN, LUMBAR (ICD-724.2)  Her updated medication list for this problem includes:    Vicodin Hp 10-660 Mg Tabs (Hydrocodone-acetaminophen) .[name]... Three times a day as needed pain  Complete Medication List: 1)  Simvastatin 20 Mg Tabs (Simvastatin) .[name].. 1 by mouth at bedtime 2)  Omeprazole 20 Mg Cpdr (Omeprazole) .... One by mouth  two times a day 3)  Depakote 500 Mg Tbec (Divalproex sodium) .... 2 at bedtime 4)  Prozac 40 Mg Caps (Fluoxetine hcl) .... Two times a day 5)  Alprazolam 2 Mg Tabs (Alprazolam) .... Three times a day 6)  Vicodin Hp 10-660 Mg Tabs (Hydrocodone-acetaminophen) .... Three times a day as needed pain  Other Orders: Admin 1st Vaccine (29562) Flu Vaccine 86yrs + (13086)  Patient Instructions: 1)  Please schedule a follow-up appointment in 1 month.  Prescriptions: VICODIN HP  10-660 MG TABS (HYDROCODONE-ACETAMINOPHEN) three times a day as needed pain  #90 x 2   Entered and Authorized by:   Nelwyn Salisbury MD   Signed by:   Nelwyn Salisbury MD on 03/23/2010   Method used:   Print then Give to Patient   RxID:   [id number] ALPRAZOLAM 2 MG TABS (ALPRAZOLAM) three times a day  #90 x 2   Entered and Authorized by:   Nelwyn Salisbury MD   Signed by:   Nelwyn Salisbury MD on 03/23/2010   Method used:   Print then Give to Patient   RxID:   [id number] DEPAKOTE 500 MG TBEC (DIVALPROEX SODIUM) 2 at bedtime  #60 x 5   Entered and Authorized by:   Nelwyn Salisbury MD   Signed by:   Nelwyn Salisbury MD on 03/23/2010   Method used:   [facility] to        [facility]  [address] [phone]* (retail)       [address]       [place], [place]  [address]       Ph: [phone] or [phone]       Fax: [phone]   RxID:   [id number] PROZAC 40 MG CAPS (FLUOXETINE HCL) two times a day  #60 x 5   Entered and Authorized by:   Nelwyn Salisbury MD   Signed by:   Nelwyn Salisbury MD on 03/23/2010   Method used:   [facility]        [facility]  [address] [phone]* (retail)       [address]       [place], [place]  [address]       Ph: [phone] or [phone]       Fax: [phone]   RxID:   [id number]

## 2010-07-20 NOTE — Assessment & Plan Note (Signed)
Summary: ?MERSA UNDER LEFT ARM/PER CINDY/CJR   Vital Signs:  Patient profile:   55 year old female Weight:      192 pounds BMI:     34.68 Temp:     97.8 degrees F oral Pulse rate:   98 / minute BP sitting:   132 / 90  (left arm) Cuff size:   large  Vitals Entered By: Alfred Levins, CMA (July 13, 2009 10:55 AM) CC: MRSA lt side   History of Present Illness: Here with another boil under the left armpit which came up 3 days ago. it is a little tender. No fever. She has a hx of boils containing MRSA, and her last flare of this was in April 2010. This was treated successfully with a course of Doxycycline.   Allergies: 1)  Sulfa  Past History:  Past Medical History: Reviewed history from 07/01/2009 and no changes required. Hyperlipidemia Allergic rhinitis depression and anxiety, sees Valinda Hoar NP at the office of Dr. Emerson Monte GERD Headache Hypertension Positive PPD Urinary incontinence sleep apnea, sees Dr. Shelle Iron sees Dr. Tresa Res for gyn exams sees Berniece Andreas for therapy chronic dizziness, weakness, and memory loss, sees Dr. Annabell Sabal at [facility]  Review of Systems  The patient denies anorexia, fever, weight loss, weight gain, vision loss, decreased hearing, hoarseness, chest pain, syncope, dyspnea on exertion, peripheral edema, prolonged cough, headaches, hemoptysis, abdominal pain, melena, hematochezia, severe indigestion/heartburn, hematuria, incontinence, genital sores, muscle weakness, suspicious skin lesions, transient blindness, difficulty walking, depression, unusual weight change, abnormal bleeding, enlarged lymph nodes, angioedema, breast masses, and testicular masses.    Physical Exam  General:  Well-developed,well-nourished,in no acute distress; alert,appropriate and cooperative throughout examination Skin:  single small tender boil on the left flank just below the axilla Axillary Nodes:  No palpable lymphadenopathy   Impression &  Recommendations:  Problem # 1:  HIDRADENITIS SUPPURATIVA (ICD-705.83)  Complete Medication List: 1)  Simvastatin 20 Mg Tabs (Simvastatin) .[name].. 1 by mouth at bedtime 2)  Omeprazole 20 Mg Cpdr (Omeprazole) .... One by mouth two times a day 3)  Alprazolam 1 Mg Tabs (Alprazolam) .... Three times a day as needed 4)  Depakote 250 Mg Tbec (Divalproex sodium) .... 2 tabs at bedtime 5)  Hydrocodone-acetaminophen 10-660 Mg Tabs (Hydrocodone-acetaminophen) .[name].. 1 by mouth every 6 hours as needed for pain 6)  Lasix 20 Mg Tabs (Furosemide) .... Once daily as needed fluid 7)  Ambien 10 Mg Tabs (Zolpidem tartrate) .... One by mouth q hs 8)  Fluconazole 150 Mg Tabs (Fluconazole) .... Once daily 9)  Freestyle Lite Test Strp (Glucose blood) .... Test once daily 10)  Nasonex 50 Mcg/act Susp (Mometasone furoate) .... 2 sprays each nostril once daily 11)  Doxycycline Hyclate 100 Mg Caps (Doxycycline hyclate) .... Two times a day  Patient Instructions: 1)  Treat with another 30 days of Doxycycline. This is probably recurrent MRSA.  2)  Please schedule a follow-up appointment as needed .  Prescriptions: DOXYCYCLINE HYCLATE 100 MG CAPS (DOXYCYCLINE HYCLATE) two times a day  #60 x 0   Entered and Authorized by:   Nelwyn Salisbury MD   Signed by:   Nelwyn Salisbury MD on 07/13/2009   Method used:   [facility] to        [facility]  [address] [phone]* (retail)       [address]       [place], [place]  [address]  Ph: [phone] or [phone]       Fax: [phone]   RxID:   [id number]

## 2010-07-20 NOTE — Assessment & Plan Note (Signed)
Summary: EVAL FOR RETURN TO WORK // RS   Vital Signs:  Patient profile:   55 year old female Weight:      171 pounds BMI:     30.89 BP sitting:   114 / 74  (left arm) Cuff size:   regular  Vitals Entered By: Raechel Ache, RN (December 13, 2009 3:28 PM) CC: Follow-up- wants to return to work.   History of Present Illness: Here to follow up on multiple medical problems including bipolar disorder, vertigo, cognitive and memory impairment, HTN, and sleep apnea. She continues to see Valinda Hoar for psychiatric care and Dr. Winfield Cunas at [facility] neurology. She had extensive neuropsychological testing at [facility] earlier this year, and according to Dr. Orlene Erm' note from 10-19-09 she demonstrates extensive mild to moderate deficiencies in memory and nonverbal reasoning. Dr. Orlene Erm felt this was due to a combination of factors including emotional distress, bipolar disorder, medication side effects, and untreated sleep apnea. She encouraged [name] [name] to see Dr. Shelle Iron again to give CPAP another try, and to follow up with [place]. In fact, after that [facility] started using her CPAP again, and she says it has made a huge differnece. She wears it every night now, and she feels more alert and more energetic. She was in [facility] from 11-20-09 to 11-23-09 for a medication adjustment. They added Provigil to her regimen, and she says it helped her a lot for about a week. She felt more alert and could think more clearly. She asks if she could be given a rx for this. Finally she wants to return to work full time ASAP, and she feels she is ready for this. Valinda Hoar agrees and has set a return day at 01-10-10, but [name] [name] wants to go back sooner. She is driving again.  Allergies: 1)  Sulfa  Past History:  Past Medical History: Reviewed history from 07/01/2009 and no changes required. Hyperlipidemia Allergic rhinitis depression and anxiety, sees Valinda Hoar NP at the office of Dr. Emerson Monte GERD Headache Hypertension Positive PPD Urinary incontinence sleep apnea, sees Dr. Shelle Iron sees Dr. Tresa Res for gyn exams sees Berniece Andreas for therapy chronic dizziness, weakness, and memory loss, sees Dr. Annabell Sabal at [facility]  Past Surgical History: Reviewed history from 01/21/2009 and no changes required. Breast biopsy Hysterectomy Lower lumbar 2003 cervical spine fusion 2006 per Dr. Donalee Citrin colonoscopy per Dr. Elsie Amis, normal UPPP per Dr. Haroldine Laws  Review of Systems  The patient denies anorexia, fever, weight loss, weight gain, vision loss, decreased hearing, hoarseness, chest pain, syncope, dyspnea on exertion, peripheral edema, prolonged cough, headaches, hemoptysis, abdominal pain, melena, hematochezia, severe indigestion/heartburn, hematuria, incontinence, genital sores, muscle weakness, suspicious skin lesions, transient blindness, difficulty walking, depression, unusual weight change, abnormal bleeding, enlarged lymph nodes, angioedema, breast masses, and testicular masses.    Physical Exam  General:  she looks the best I have seen her in well over a year. neatly dressed with makeup on. Alert, bright affect Neck:  No deformities, masses, or tenderness noted. Lungs:  Normal respiratory effort, chest expands symmetrically. Lungs are clear to auscultation, no crackles or wheezes. Heart:  Normal rate and regular rhythm. S1 and S2 normal without gallop, murmur, click, rub or other extra sounds. Neurologic:  alert & oriented X3, cranial nerves II-XII intact, and gait normal.   Psych:  Cognition and judgment appear intact. Alert and cooperative with normal attention span and concentration. No apparent delusions, illusions, hallucinations   Impression & Recommendations:  Problem # 1:  MEMORY LOSS (ICD-780.93)  Problem # 2:  BIPOLAR AFFECTIVE DISORDER (ICD-296.80)  Problem # 3:  DIZZINESS (ICD-780.4)  Problem # 4:  HYPERTENSION (ICD-401.9)  Her updated  medication list for this problem includes:    Lasix 20 Mg Tabs (Furosemide) ..... Once daily as needed fluid  Complete Medication List: 1)  Simvastatin 20 Mg Tabs (Simvastatin) .[name].. 1 by mouth at bedtime 2)  Omeprazole 20 Mg Cpdr (Omeprazole) .... One by mouth two times a day 3)  Alprazolam 1 Mg Tabs (Alprazolam) .... Three times a day as needed 4)  Depakote 250 Mg Tbec (Divalproex sodium) .... 2 tabs at bedtime 5)  Hydrocodone-acetaminophen 10-660 Mg Tabs (Hydrocodone-acetaminophen) .[name].. 1 by mouth every 6 hours as needed for pain 6)  Lasix 20 Mg Tabs (Furosemide) .... Once daily as needed fluid 7)  Ambien 10 Mg Tabs (Zolpidem tartrate) .... One by mouth q hs 8)  Fluconazole 150 Mg Tabs (Fluconazole) .... Once daily 9)  Freestyle Lite Test Strp (Glucose blood) .... Test once daily 10)  Nasonex 50 Mcg/act Susp (Mometasone furoate) .... 2 sprays each nostril once daily 11)  Lexapro 20 Mg Tabs (Escitalopram oxalate) .[name].. 1 once daily  Patient Instructions: 1)  She has made great improvements in all these areas, and I agree that she is able to return to work. She is to meet with Valinda Hoar next week, and she will ask her about returning to work sooner than 01-10-10. I will support whatever return date they agree to. She may benfit from Provigil, but I told her to ask Valinda Hoar about this, since she would be the appropriate one to prescribe this.

## 2010-07-20 NOTE — Progress Notes (Signed)
Summary: Order for rehab services for vertigo  Phone Note Call from Patient Call back at Home Phone [phone]   Caller: Patient triage VM Call For: Nelwyn Salisbury MD Summary of Call: Triage VM, pt requesting an order be sent to [facility] for her vertigo problems.  She would like to see Therapist Jackelyn Hoehn.  Her numnber is [phone].  Pt states Dr Alben Spittle used to order this for her, however she no longer see's him. Initial call taken by: Sid Falcon LPN,  July 15, 2008 12:14 PM  Follow-up for Phone Call        [facility] give order for vestibular rehab for vertigo Follow-up by: Nelwyn Salisbury MD,  July 15, 2008 12:44 PM  Additional Follow-up for Phone Call Additional follow up Details #1::        Phone Call Completed Additional Follow-up by: Alfred Levins, CMA,  July 15, 2008 12:47 PM

## 2010-07-20 NOTE — Letter (Signed)
Summary: [facility]  [facility]   Imported By: Maryln Gottron 08/01/2009 10:32:47  _____________________________________________________________________  External Attachment:    Type:   Image     Comment:   External Document

## 2010-07-20 NOTE — Assessment & Plan Note (Signed)
Summary: fu from psychiatry/njr   Vital Signs:  Patient profile:   55 year old female Weight:      166 pounds BMI:     29.99 BP sitting:   106 / 68  (left arm) Cuff size:   regular  Vitals Entered By: Raechel Ache, RN (January 31, 2010 3:54 PM) CC: F/u per psych- c/o shaking and off balance.   History of Present Illness: Here with her sister to follow up bipolar disorder, memory issues, anxiety, and sleep apnea. She continues to use her CPAP every night , and this has been helpful. She attempted to return to work last month, but had to leave after a few days. She was completely overwhelmed by her responsibilities, and she became extremely anxious. It is apparent that she will never be able to work again, and in fact Academic librarian (who is treating her psychiatrically) agrees with this. Amma is reapplying for disability now, and I think she will be approved. She is not driving now either.   Allergies: 1)  Sulfa  Past History:  Past Medical History: Reviewed history from 07/01/2009 and no changes required. Hyperlipidemia Allergic rhinitis depression and anxiety, sees Valinda Hoar NP at the office of Dr. Emerson Monte GERD Headache Hypertension Positive PPD Urinary incontinence sleep apnea, sees Dr. Shelle Iron sees Dr. Tresa Res for gyn exams sees Berniece Andreas for therapy chronic dizziness, weakness, and memory loss, sees Dr. Annabell Sabal at [facility]  Review of Systems  The patient denies anorexia, fever, weight loss, weight gain, vision loss, decreased hearing, hoarseness, chest pain, syncope, dyspnea on exertion, peripheral edema, prolonged cough, headaches, hemoptysis, abdominal pain, melena, hematochezia, severe indigestion/heartburn, hematuria, incontinence, genital sores, muscle weakness, suspicious skin lesions, transient blindness, difficulty walking, depression, unusual weight change, abnormal bleeding, enlarged lymph nodes, angioedema, breast masses, and testicular  masses.    Physical Exam  General:  Well-developed,well-nourished,in no acute distress; alert,appropriate and cooperative throughout examination Neck:  No deformities, masses, or tenderness noted. Lungs:  Normal respiratory effort, chest expands symmetrically. Lungs are clear to auscultation, no crackles or wheezes. Heart:  Normal rate and regular rhythm. S1 and S2 normal without gallop, murmur, click, rub or other extra sounds. Neurologic:  alert & oriented X3, cranial nerves II-XII intact, and gait normal.   Psych:  Oriented X3, memory intact for recent and remote, normally interactive, good eye contact, and slightly anxious.     Impression & Recommendations:  Problem # 1:  BIPOLAR AFFECTIVE DISORDER (ICD-296.80)  Problem # 2:  MEMORY LOSS (ICD-780.93)  Problem # 3:  DIZZINESS (ICD-780.4)  Problem # 4:  ANXIETY (ICD-300.00)  Her updated medication list for this problem includes:    Alprazolam 1 Mg Tabs (Alprazolam) .[name]... Three times a day as needed    Lexapro 20 Mg Tabs (Escitalopram oxalate) .[name]... 1 once daily  Complete Medication List: 1)  Simvastatin 20 Mg Tabs (Simvastatin) .[name].. 1 by mouth at bedtime 2)  Omeprazole 20 Mg Cpdr (Omeprazole) .... One by mouth two times a day 3)  Alprazolam 1 Mg Tabs (Alprazolam) .... Three times a day as needed 4)  Hydrocodone-acetaminophen 10-660 Mg Tabs (Hydrocodone-acetaminophen) .[name].. 1 by mouth every 6 hours as needed for pain 5)  Lasix 20 Mg Tabs (Furosemide) .... Once daily as needed fluid 6)  Lexapro 20 Mg Tabs (Escitalopram oxalate) .[name].. 1 once daily 7)  Depakote 500 Mg Tbec (Divalproex sodium) .... 2 at bedtime  Patient Instructions: 1)  Please schedule a follow-up appointment in 3 months .

## 2010-07-20 NOTE — Letter (Signed)
Summary: [facility]-Dr. Haroldine Laws  [facility] Thro[facility]-Dr. Haroldine Laws   Imported By: Maryln Gottron 04/29/2008 13:50:26  _____________________________________________________________________  External Attachment:    Type:   Image     Comment:   External Document

## 2010-07-20 NOTE — Letter (Signed)
Summary: Attending Physician's Statement of Functionality  Attending Physician's Statement of Functionality   Imported By: Maryln Gottron 07/06/2009 11:13:27  _____________________________________________________________________  External Attachment:    Type:   Image     Comment:   External Document

## 2010-07-20 NOTE — Progress Notes (Signed)
Summary: referral to neurologist   Phone Note Call from Patient   Caller: [name] [name] calling for pt  Summary of Call: calling about status on referral to neurologist   call back at [phone]  Initial call taken by: Pura Spice, RN,  July 04, 2010 4:14 PM  Follow-up for Phone Call        we never got the MRI report from last October. She needs to get this for me to look at first  Follow-up by: Nelwyn Salisbury MD,  July 05, 2010 9:05 AM  Additional Follow-up for Phone Call Additional follow up Details #1::        mess left  Additional Follow-up by: Pura Spice, RN,  July 05, 2010 9:12 AM    Additional Follow-up for Phone Call Additional follow up Details #2::    spoke with pt and she will get MRI report  Follow-up by: Pura Spice, RN,  July 06, 2010 11:34 AM

## 2010-07-20 NOTE — Letter (Signed)
Summary: [facility]  [facility]   Imported By: Maryln Gottron 04/15/2009 10:54:42  _____________________________________________________________________  External Attachment:    Type:   Image     Comment:   External Document

## 2010-07-20 NOTE — Letter (Signed)
Summary: Dr. Emerson Monte  Dr. Emerson Monte   Imported By: Maryln Gottron 03/03/2010 12:23:38  _____________________________________________________________________  External Attachment:    Type:   Image     Comment:   External Document

## 2010-07-20 NOTE — Assessment & Plan Note (Signed)
Summary: consult re: getting mri/cjr   Vital Signs:  Patient profile:   55 year old female Weight:      167 pounds O2 Sat:      97 % Temp:     98.4 degrees F Pulse rate:   91 / minute BP sitting:   120 / 80  (left arm) Cuff size:   large  Vitals Entered By: Pura Spice, RN (April 17, 2010 1:06 PM) CC: discuss MRI that was ordered by Dr Haroldine Laws  whom wants her referred to neurologist . pt prefers not to go to Dr Anne Hahn    History of Present Illness: Here to discuss referral to a new neurologist. This month she has switched to a new psychiatrist, Dr. Geoffery Lyons, and a new therapist, Forrest Moron at [facility] She saw Dr. Eloise Levels on 04-13-10 about her chronic dizziness, and he felt that this is not due to an inner ear problem. He sent her for an MRI of the head, which she had this morning. He suggested she see Neurology again, but she would like to see someone in [facility] since this is closer to her home. She had been seeing Dr. Annabell Sabal at [facility], but she says this was too far away since she has to rely on other people to drive her.   Allergies: 1)  Sulfa  Past History:  Past Medical History: Reviewed history from 07/01/2009 and no changes required. Hyperlipidemia Allergic rhinitis depression and anxiety, sees Valinda Hoar NP at the office of Dr. Emerson Monte GERD Headache Hypertension Positive PPD Urinary incontinence sleep apnea, sees Dr. Shelle Iron sees Dr. Tresa Res for gyn exams sees Berniece Andreas for therapy chronic dizziness, weakness, and memory loss, sees Dr. Annabell Sabal at [facility]  Past Surgical History: Reviewed history from 01/21/2009 and no changes required. Breast biopsy Hysterectomy Lower lumbar 2003 cervical spine fusion 2006 per Dr. Donalee Citrin colonoscopy per Dr. Elsie Amis, normal UPPP per Dr. Haroldine Laws  Review of Systems  The patient denies anorexia, fever, weight loss, weight gain, vision loss, decreased hearing,  hoarseness, chest pain, syncope, dyspnea on exertion, peripheral edema, prolonged cough, headaches, hemoptysis, abdominal pain, melena, hematochezia, severe indigestion/heartburn, hematuria, incontinence, genital sores, muscle weakness, suspicious skin lesions, transient blindness, difficulty walking, depression, unusual weight change, abnormal bleeding, enlarged lymph nodes, angioedema, breast masses, and testicular masses.    Physical Exam  General:  Well-developed,well-nourished,in no acute distress; alert,appropriate and cooperative throughout examination Neurologic:  No cranial nerve deficits noted. Station and gait are normal. Plantar reflexes are down-going bilaterally. DTRs are symmetrical throughout. Sensory, motor and coordinative functions appear intact. Psych:  Cognition and judgment appear intact. Alert and cooperative with normal attention span and concentration. No apparent delusions, illusions, hallucinations   Impression & Recommendations:  Problem # 1:  BIPOLAR AFFECTIVE DISORDER (ICD-296.80)  Problem # 2:  MEMORY LOSS (ICD-780.93)  Problem # 3:  DIZZINESS (ICD-780.4)  Complete Medication List: 1)  Simvastatin 20 Mg Tabs (Simvastatin) .[name].. 1 by mouth at bedtime 2)  Omeprazole 20 Mg Cpdr (Omeprazole) .... One by mouth two times a day 3)  Depakote 500 Mg Tbec (Divalproex sodium) .... 2 at bedtime 4)  Alprazolam 2 Mg Tabs (Alprazolam) .... Three times a day 5)  Vicodin Hp 10-660 Mg Tabs (Hydrocodone-acetaminophen) .... Three times a day as needed pain 6)  Lamictal 25 Mg Tabs (Lamotrigine) .... Once daily dr Dub Mikes 7)  Seroquel 25 Mg Tabs (Quetiapine fumarate) .... Dr. Dub Mikes  Patient Instructions: 1)  We will receive a faxed radiologist report about her MRI by tomorrow. At that point we will probably refer her to [facility] as above.    Orders Added: 1)  Est. Patient Level IV [16109]

## 2010-07-20 NOTE — Assessment & Plan Note (Signed)
Summary: ? needs labs//ccm   Vital Signs:  Patient profile:   55 year old female Weight:      162 pounds O2 Sat:      96 % Temp:     98.4 degrees F Pulse rate:   104 / minute BP sitting:   120 / 92  (left arm)  Vitals Entered By: Pura Spice, RN (February 27, 2010 2:38 PM) CC: sees psychiatrist Berniece Andreas states tested positive for amphemtamines and benzopines so pt wnats to clairfy the results   History of Present Illness: Here asking for a drug screen. I had received some office notes from Vernell Morgans about inconsistencies in [place] story recently. She had been given rx for Vicodin and Alprazolam from me over the past several months, during which time I had not realized that she had been taken off these meds during her previous detox hospital stay. Sharyl Nimrod intended that she not take any potentially habit forming meds. They had checked with her pharmacy, and indeed Eren has been picking up these refills on a regular basis. When confronted with this information, Zosia denied that she was taking any of these meds and claimed that she was flushing them down the toilet every time she picked them up. Of course, this does not make any sense to [place]. At any rate, Kiarra has been seeing Berniece Andreas for therapy, and Raynelle Fanning must have had concerns about Brett's medication use because she gave her a rapid in-office drug screen today. This was positive for amphetamines and benzodiazepines. Keven denies that she is taking anything like this, and she says the screen must be wrong. She asks me to do a more in depth drug screen today to "clear things up".   Allergies: 1)  Sulfa  Past History:  Past Medical History: Reviewed history from 07/01/2009 and no changes required. Hyperlipidemia Allergic rhinitis depression and anxiety, sees Valinda Hoar NP at the office of Dr. Emerson Monte GERD Headache Hypertension Positive PPD Urinary incontinence sleep apnea, sees Dr. Shelle Iron sees Dr.  Tresa Res for gyn exams sees Berniece Andreas for therapy chronic dizziness, weakness, and memory loss, sees Dr. Annabell Sabal at [facility]  Review of Systems  The patient denies anorexia, fever, weight loss, weight gain, vision loss, decreased hearing, hoarseness, chest pain, syncope, dyspnea on exertion, peripheral edema, prolonged cough, headaches, hemoptysis, abdominal pain, melena, hematochezia, severe indigestion/heartburn, hematuria, incontinence, genital sores, muscle weakness, suspicious skin lesions, transient blindness, difficulty walking, depression, unusual weight change, abnormal bleeding, enlarged lymph nodes, angioedema, breast masses, and testicular masses.    Physical Exam  General:  Well-developed,well-nourished,in no acute distress; alert,appropriate and cooperative throughout examination Neurologic:  alert & oriented X3 and gait normal.   Psych:  Cognition and judgment appear intact. Alert and cooperative with normal attention span and concentration. No apparent delusions, illusions, hallucinations   Impression & Recommendations:  Problem # 1:  BIPOLAR AFFECTIVE DISORDER (ICD-296.80)  Orders: T- * Misc. Laboratory test [phone])  Problem # 2:  BACK PAIN, LUMBAR (ICD-724.2)  The following medications were removed from the medication list:    Hydrocodone-acetaminophen 10-660 Mg Tabs (Hydrocodone-acetaminophen) .[name]... 1 by mouth every 6 hours as needed for pain  Complete Medication List: 1)  Simvastatin 20 Mg Tabs (Simvastatin) .[name].. 1 by mouth at bedtime 2)  Omeprazole 20 Mg Cpdr (Omeprazole) .... One by mouth two times a day 3)  Depakote 500 Mg Tbec (Divalproex sodium) .... 2 at bedtime 4)  Citalopram Hydrobromide 40 Mg Tabs (Citalopram hydrobromide) .Marland KitchenMarland KitchenMarland Kitchen  Once daily  Patient Instructions: 1)  We spent 30 minutes discussing this chain of events, and we agreed to do a full urine drug screen today. We will follow up once the results are back.

## 2010-07-20 NOTE — Progress Notes (Signed)
Summary: refill Vicodin  Phone Note Refill Request Message from:  Fax from Pharmacy on December 09, 2009 12:03 PM  Refills Requested: Medication #1:  HYDROCODONE-ACETAMINOPHEN 10-660 MG TABS 1 by mouth every 6 hours as needed for pain   Dosage confirmed as above?Dosage Confirmed   Last Refilled: 10/21/2009  Method Requested: Fax to Local Pharmacy Initial call taken by: Raechel Ache, RN,  December 09, 2009 12:04 PM [facility]  [address] [phone]  Follow-up for Phone Call        call in #120 with 5 rf Follow-up by: Nelwyn Salisbury MD,  December 09, 2009 1:15 PM  Additional Follow-up for Phone Call Additional follow up Details #1::        Rx faxed to pharmacy Additional Follow-up by: Raechel Ache, RN,  December 09, 2009 1:51 PM    Prescriptions: HYDROCODONE-ACETAMINOPHEN 10-660 MG TABS (HYDROCODONE-ACETAMINOPHEN) 1 by mouth every 6 hours as needed for pain  #120 x 5   Entered by:   Raechel Ache, RN   Authorized by:   Nelwyn Salisbury MD   Signed by:   Raechel Ache, RN on 12/09/2009   Method used:   Historical   RxID:   [id number]

## 2010-07-20 NOTE — Letter (Signed)
Summary: [facility]urology  [facility]   Imported By: Maryln Gottron 11/03/2009 10:32:07  _____________________________________________________________________  External Attachment:    Type:   Image     Comment:   External Document

## 2010-07-20 NOTE — Progress Notes (Signed)
Summary: change med  Phone Note Call from Patient Call back at Home Phone [phone]   Caller: Patient Call For: [name] [name] Summary of Call: can't afford effexor and wants to swith to prozac.  Effexor cost $88 Initial call taken by: Alfred Levins, CMA,  September 06, 2008 10:11 AM  Follow-up for Phone Call        OK, switch to Prozac 40 mg once daily , #30 with 5 rf Follow-up by: Nelwyn Salisbury MD,  September 06, 2008 10:20 AM  Additional Follow-up for Phone Call Additional follow up Details #1::        Phone Call Completed, Rx Called In Additional Follow-up by: Alfred Levins, CMA,  September 06, 2008 10:30 AM    New/Updated Medications: PROZAC 40 MG CAPS (FLUOXETINE HCL) 1 by mouth once daily   Prescriptions: PROZAC 40 MG CAPS (FLUOXETINE HCL) 1 by mouth once daily  #30 x 5   Entered by:   Alfred Levins, CMA   Authorized by:   Nelwyn Salisbury MD   Signed by:   Alfred Levins, CMA on 09/06/2008   Method used:   [facility]        [facility]  [address] [phone]* (retail)       [address]       [place], [place]  [address]       Ph: [phone] or [phone]       Fax: [phone]   RxID:   [id number]

## 2010-08-01 ENCOUNTER — Other Ambulatory Visit: Payer: Self-pay

## 2010-09-27 ENCOUNTER — Encounter: Payer: Self-pay | Admitting: Family Medicine

## 2010-10-03 LAB — DIFFERENTIAL
Basophils Absolute: 0 10*3/uL (ref 0.0–0.1)
Basophils Relative: 1 % (ref 0–1)
Eosinophils Absolute: 0.3 10*3/uL (ref 0.0–0.7)
Eosinophils Relative: 5 % (ref 0–5)
Lymphocytes Relative: 31 % (ref 12–46)
Lymphs Abs: 1.9 10*3/uL (ref 0.7–4.0)
Monocytes Absolute: 0.4 10*3/uL (ref 0.1–1.0)
Monocytes Relative: 6 % (ref 3–12)
Neutro Abs: 3.6 10*3/uL (ref 1.7–7.7)
Neutrophils Relative %: 59 % (ref 43–77)

## 2010-10-03 LAB — CBC
HCT: 37.3 % (ref 36.0–46.0)
Hemoglobin: 12.8 g/dL (ref 12.0–15.0)
MCHC: 34.4 g/dL (ref 30.0–36.0)
MCV: 92.2 fL (ref 78.0–100.0)
Platelets: 163 10*3/uL (ref 150–400)
RBC: 4.04 MIL/uL (ref 3.87–5.11)
RDW: 13.5 % (ref 11.5–15.5)
WBC: 6.2 10*3/uL (ref 4.0–10.5)

## 2010-10-03 LAB — RAPID URINE DRUG SCREEN, HOSP PERFORMED
Amphetamines: NOT DETECTED
Barbiturates: NOT DETECTED
Benzodiazepines: POSITIVE — AB
Cocaine: NOT DETECTED
Opiates: POSITIVE — AB
Tetrahydrocannabinol: NOT DETECTED

## 2010-10-03 LAB — BASIC METABOLIC PANEL
BUN: 6 mg/dL (ref 6–23)
CO2: 27 mEq/L (ref 19–32)
Calcium: 8.9 mg/dL (ref 8.4–10.5)
Chloride: 100 mEq/L (ref 96–112)
Creatinine, Ser: 0.56 mg/dL (ref 0.4–1.2)
GFR calc Af Amer: >60 mL/min (ref >60)
GFR calc non Af Amer: >60 mL/min (ref >60)
Glucose, Bld: 108 mg/dL — ABNORMAL HIGH (ref 70–99)
Potassium: 3.4 mEq/L — ABNORMAL LOW (ref 3.5–5.1)
Sodium: 134 mEq/L — ABNORMAL LOW (ref 135–145)

## 2010-10-03 LAB — ETHANOL: Alcohol, Ethyl (B): <5 mg/dL (ref 0–10)

## 2010-10-31 NOTE — Discharge Summary (Signed)
[name], [name] [name]              ACCOUNT NO.:  [id number]   MEDICAL RECORD NO.:  [id number]          PATIENT TYPE:  INP   LOCATION:  1514                         FACILITY:  [facility]   PHYSICIAN:  Stacie Glaze, MD    DATE OF BIRTH:  08/27/1955   DATE OF ADMISSION:  01/29/2008  DATE OF DISCHARGE:  02/01/2008                               DISCHARGE SUMMARY   ADMITTING DIAGNOSES:  1. Facial cellulitis.  2. Jaw pain.  3. Ear pain.  4. Acute depression/anxiety.   DISCHARGE DIAGNOSES:  1. Cellulitis of the left ear and pinna, resolving.  2. Depression, treated.   HOSPITAL COURSE:  The patient is a pel 55 year old white female patient  of Dr. Gershon Crane who presented on admission from office for cellulitis  of the face and ear and acute depression.  She was admitted to the  General Medicine Service and begun on IV antibiotics because of severe  otitis externa with cellulitis spreading to the ear and to the face and  jaw.  IV antibiotics were ordered, initially Avelox IV, however, this  was converted to 1 g of vancomycin and Rocephin due to penetration of  the soft tissues in the ear.   The patient responded quickly to the change in antibiotics with  decreased swelling to approximately less than one quarter of the initial  swelling on the physical examination.  Crusting of the ear was treated  with topical Altabax antibiotic cream applied twice daily.  The patient  had significant decreased pain, however, she did have remaining soft  tissue swelling involving her jaw and ear, but the erythema and the  crusting had significantly reduced at the time of discharge.   During this hospitalization, her depression and bipolar disease were  also addressed with consultation obtained with Dr. Jeanie Sewer which is on  the chart and should be reviewed.  The recommendations were cognitive  behavioral therapy which will follow as an outpatient basis.  Psychotropic management was begun with Effexor  37.5 mg p.o. daily and  then increased to 75 mg daily with a target dose of 75 mg twice daily.  Her Xanax was continued at 0.5 to 1 mg p.o. t.i.d. with total effects in  reducing it.   DISPOSITION:  She will be discharged home with followup with Dr. Gershon Crane within 1 week.  Referral from Dr. Claris Che office should be made for  behavioral cognitive therapy.  Prescriptions are given to the patient by  electronic transfer to a pharmacy [facility] Effexor 75 mg  one p.o. daily for 1 week, then 1 p.o. b.i.d.  Ceftin 250 mg p.o. b.i.d.  for 14 days, and for doxycycline 100 mg p.o. b.i.d. for 14 days.   The patient is discharged to home in improved and stable condition.  She  is instructed, should the cellulitis return, erythema increase, ear pain  increase, she may return to the emergency room for appropriate  evaluation.      Stacie Glaze, MD  Electronically Signed     JEJ/MEDQ  D:  02/01/2008  T:  02/01/2008  Job:  [id number]

## 2010-10-31 NOTE — Consult Note (Signed)
[name] [name], [name] [name]              ACCOUNT NO.:  [id number]   MEDICAL RECORD NO.:  [id number]          PATIENT TYPE:  INP   LOCATION:  1514                         FACILITY:  [facility]   PHYSICIAN:  Antonietta Breach, M.D.  DATE OF BIRTH:  [birth year]   DATE OF CONSULTATION:  01/30/2008  DATE OF DISCHARGE:                                 CONSULTATION   REASON FOR CONSULTATION:  Depression, anxiety.   REQUESTING PHYSICIAN:  Valerie A. Felicity Coyer, M.D.   HISTORY OF PRESENT ILLNESS:  Mrs. Schuff is a 55 year old female  admitted to the [facility] on January 29, 2008, due to  dehydration and left ear cellulitis.   Mrs. Catalina is requiring inpatient IV antibiotic therapy for her  cellulitis.   Mrs. Viramontes has been experiencing regular anxiety with severe feeling  on edge, muscle tension and excessive worry.  This has become worse in  the past 3 weeks.  She is getting some relief from Xanax 2 mg, which has  been increased to 2 mg 3 times a day.   Mrs. Grand does not have any thoughts of harming herself or others.  She does have intact interests as well as constructive future goals.  She does have hope.   Her orientation and memory function are intact.  She is cooperative with  bedside care.   PAST PSYCHIATRIC HISTORY:  In review of the past medical record, bipolar  disorder is mentioned as well as lithium treatment.  Her lithium has  been discontinued.  She presented with a level of 0.42.   Mrs. Cichowski was carefully questioned about her past history.  She  denied any history of periods of decreased need for sleep, elevated  energy.  She requested that her husband be in the room to assist with  history.  He did not know of any manic symptoms either.   Mrs. Charley has no history of suicide attempts.  She did undergo an  admission to [facility] for depression in the early  1990s.   Mrs. Kotter has been tried on a number of antidepressants, including  Prozac, Zoloft, Celexa.  She states that they worked for [employer] and then  stopped working.  She also has been tried on Seroquel.   FAMILY PSYCHIATRIC HISTORY:  None known.   SOCIAL HISTORY:  Mrs. Staples has a mutually supportive marriage.  There  is no abuse.  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  Mrs. Scrivens states that her workplace has been extremely  stressful.  She is now on medical leave.  She does not use any alcohol or illegal  drugs.   PAST MEDICAL HISTORY:  As above, Mrs. Manganiello has left ear cellulitis.  She is on antibiotic therapy IV.   MEDICATIONS:  The MAR is reviewed.  Her psychotropics at this time  include Xanax 2  mg t.i.d.   ALLERGIES:  SULFA AND LIPITOR.   LABORATORY DATA:  Lithium was 0.42 and has been discontinued.   Urinalysis, TSH, complete metabolic panel and CBC all unremarkable.   REVIEW OF SYSTEMS:  Constitutional, head, eyes, ears, nose,  throat,  mouth, neurologic, psychiatric, cardiovascular, respiratory,  gastrointestinal, genitourinary, skin, musculoskeletal, hematologic,  lymphatic, endocrine, metabolic all unremarkable.   PHYSICAL EXAMINATION:  VITAL SIGNS:  Temperature 97.2, pulse 89,  respiratory rate 20.  Blood pressure 118/69, O2 saturation on room air  100%.  GENERAL APPEARANCE:  Mrs. Anchondo is a middle-aged female,  sitting up  in her hospital bed with no abnormal involuntary movements.   MENTAL STATUS EXAM:  Mrs. Frenz is alert.  Her eye contact is good.  She is oriented to all spheres.  Her mood is depressed.  Her affect is  constricted.   Memory is intact to immediate, recent and remote.  Fund of knowledge and  intelligence within normal limits.  Speech involves normal rate and  prosody without dysarthria.  Thought process is logical, coherent, goal-  directed.  No looseness of associations.  Thought content:  No thoughts  of harming herself, no thoughts of harming others.  No delusions.  No   hallucinations.  Insight is intact.  Judgment is intact.   ASSESSMENT:  AXIS I:  1. 293.84, anxiety disorder not otherwise specified, rule out      generalized anxiety disorder.  2. 293.83, mood disorder not otherwise specified, depressed.  Rule out      296.35, major depressive disorder, recurrent, in partial remission.  AXIS II:  Deferred.  AXIS III:  See past medical history.  AXIS IV:  General medical and occupational.  AXIS V:  55.   Mrs. Tamm is not at risk to harm herself or others.  She agrees to  call emergency services immediately for any thoughts of harming herself,  thoughts of harming others, or other emergency psychiatric symptoms.   Mrs. Boultinghouse agrees to not drive if drowsy.   The undersigned provided ego supportive psychotherapy and education.   The indications, alternatives and adverse effects of Effexor and Xanax  were discussed with the patient, including the risk of Xanax dependence  and Effexor with high blood pressure.   The patient understands and wants to proceed as below.   RECOMMENDATIONS:  1. The undersigned also discussed cognitive behavioral therapy, deep      breathing and progressive muscle relaxation training.  The patient      understands and would like to proceed with this as an outpatient;      however, she would like to start with an intensive outpatient      program. Would ask the case manager to set Mrs. Giuliani up with an      intensive outpatient program at one of the local hospitals, [facility],      [facility] or [facility] according to the patient's      choice.  The intensive outpatient program can start upon discharge      and it can provide psychotropic medication management as well as      psychotherapy starting in the first week after discharge.  From      that point, the patient can proceed on to standard outpatient      medication management and psychotherapy.  Her long-term outpatient      psychotherapy would  ideally involve cognitive behavioral therapy      along with deep breathing and progressive muscle relaxation.  2. Would start Effexor 37.5 mg p.o. q.a.m. while monitoring blood      pressure, would then titrate the Effexor by 37.5 mg every 3 days to      the target trial dose of 75 mg  b.i.d.  3. Would continue her Xanax 0.5 to 1.5 mg t.i.d. p.r.n. with a goal of      eventually reducing it to as low as possible and possibly      discontinuing it while combining Effexor with psychotherapy.  The      Xanax can also be used for anti-insomnia as well acutely.      Antonietta Breach, M.D.  Electronically Signed     JW/MEDQ  D:  01/30/2008  T:  01/30/2008  Job:  [id number]
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
## 2010-10-31 NOTE — Assessment & Plan Note (Signed)
[facility]                         ELECTROPHYSIOLOGY OFFICE NOTE   NAME:[name] [name] [name]                     MRN:          [id number]  DATE:06/16/2008                            DOB:          28-Nov-1955    The patient is referred today by Dr. Donalee [name] for evaluation of dizzy  spells and difficulty with her balance.  The current problems date back  several months ago.  She was diagnosed with a Staph infection and was  hospitalized and received IV antibiotics.  This apparently involved her  ear.  The patient since then has continued to have problems with  balance.  She has also a long-standing history of depression and  anxiety.  She has a history of lower back pain.  She has a history of  arthritis in her back.  She underwent lower back surgery in 2002 and  again in 2004 by her report.  She states however that she was able to  work a regular job doing fairly strenuous work.  Since her Staph  infection involving her ear, she states that she developed what was  described as Bell palsy.  The patient now notes that when she tries to  walk or go from a sitting-to-standing position, she tends to fall to the  right and when she turns her head suddenly, she feels like she will fall  to the direction that she turns her head.  She has never had frank  syncope.  She has not lost bowel or bladder continence.  There are no  seizure-like activities and there are no tongue-biting noted.  The  patient was seen by Dr. Lesia [name] in Neurology.  I had been able to  discuss the patient's case with him with notes about that follow.   SOCIAL HISTORY:  The patient smokes 1 pack of cigarettes a day and has  done so for the past 30 years.  She is married.  She has 2 children.  She denies recreational drug use.  She denies alcoholic drug use.  She  uses 2 or 3 caffeinated beverages per day.   PAST MEDICAL HISTORY:  Notable for a diagnosis of sleep apnea, although  I am  not certain that she is using the CPAP.  She has a history of  allergic rhinitis.  She has problems with constipation.  She has a  history of anxiety and depression.  I note that she had been seen by Dr.  Jeanie [name] in the psychiatry department for depression and anxiety and he  had recommended behavioral therapy for the patient and started her on  Effexor.  She has also been on Xanax.   FAMILY HISTORY:  Notable for a mother being alive and well.  Her father  is deceased, died of complications of alcohol abuse.  She has 2 sisters  and 1 brother who are alive.   ADDITIONAL PAST SURGICAL HISTORY:  Notable for hysterectomy in 1997.   REVIEW OF SYSTEMS:  As previously noted in the HPI.   PHYSICAL EXAMINATION:  GENERAL:  She is a pleasant middle-aged woman, in  no acute distress.  VITAL  SIGNS:  The blood pressure today was checked in the supine  position and was 150/99, the heart rate was 82.  Sitting the blood  pressure not changed significantly and the heart rate was 90.  After 5  minutes of standing, the blood pressure remained 150/100, the pulse  remained in the mid 80s, and respirations were 18.  Her weight was 163  pounds.  HEENT:  Normocephalic and atraumatic.  Pupils equal and round.  Oropharynx was moist.  Sclerae anicteric.  I had not examined her ears.  NECK:  No jugular venous distention.  There is no thyromegaly.  Trachea  is midline.  The carotids are 2+ and symmetric.  LUNGS:  Clear bilaterally to auscultation.  No wheezes, rales, or  rhonchi are present.  There is no increased work of breathing.  CARDIOVASCULAR:  Regular rate and rhythm.  Normal S1 and S2.  There are  no murmurs, rubs, or gallops.  The PMI was not enlarged nor was it  laterally displaced.  ABDOMEN:  Soft, nontender, nondistended.  There is no organomegaly.  The  bowel sounds are present.  There is no rebound or guarding.  EXTREMITIES:  Demonstrated no cyanosis, clubbing, or edema.  The pulses  were 2+ and  symmetric.  NEUROLOGIC:  Alert and oriented x3.  Cranial nerves intact.  Strength  was 5/5 and symmetric.  Of note, I did not test her gait or balance.   Her EKG demonstrates sinus rhythm with normal axis and intervals.   IMPRESSION:  1. Difficulty with gait and balance with dizziness and      lightheadedness, all according to the patient following a bout of      cellulitis involving her left ear.  2. History of left face and ear cellulitis thought to be due to      Staphylococcus aureus, status post antibiotic therapy.  3. Anxiety and depression.   DISCUSSION:  Based on the patient's orthostatic blood pressure check and  the fact that she has never passed out and that her symptoms all appear  to have occurred after her episode of cellulitis, I think at this point  head-up tilt table testing would be a very little benefit or diagnostic  yield.  Her symptoms are not that of somebody with neurally-mediated  syncope.  In fact, the patient has never had syncope.  I have discussed  the case with Dr. Lesia [name] who has seen the patient in the Neurology  Department.  He reports that there is a discrepancy between the  patient's complaints and her physical exam findings.  This discrepancy  is not clear at this time.  My expectation is that as time goes on, the  patient will gradually improve.  The real issue at this point is whether  or not the patient can have anything done that might help her symptoms  and I have counseled as I think that there is very little that can be  done at the present time other then additional followup.  Review of the  patient's head CT was unremarkable.  She has in fact since seen Dr. Wynetta [name]  and had an MRI of the brain, the results of which I do not have  available to me.  I will plan to see the patient back in the office in  several weeks for a followup and see how she is doing.  I will plan to  further discuss the case with Dr. Anne [name].     [name] [name]. [name] Ridgel,  MD  Electronically Signed    GWT/MedQ  DD: 06/18/2008  DT: 06/18/2008  Job #: [id number]   cc:   [name] [name], M.D.  [name] [name], M.D.

## 2010-10-31 NOTE — Assessment & Plan Note (Signed)
[facility]                         ELECTROPHYSIOLOGY OFFICE NOTE   NAME:ROBBINSLatitia, Housewright                     MRN:          [id number]  DATE:07/06/2008                            DOB:          1956-01-15    Ms. Levit returns today for followup.  She is a patient, who has  initially been referred by Dr. Wynetta Emery for dizziness and problems with  dialysis and for possible autonomic dysfunction.  When I saw her back in  December, a careful history was obtained which demonstrated that while  she has had a history of depression, anxiety problems in the past, her  current problem with dizziness and lightheadedness and difficulty with  balance dates back to a staph infection involving her ear that she  suffered back in the summer.  She has never quite got better as far as  her balance and equilibrium goes.  The patient returns today for  followup.  Her symptoms are basically unchanged at times, they are worse  than others.  There is no loss of bowel or bladder continence.  They are  not particularly related to sitting or standing.  She remains dizzy.  She wonders if her medication that she takes for depression and anxiety  might be exacerbating the problem.  When I saw the patient last, I had a  chance to speak to Dr. Wynetta Emery as well as Dr. Anne Hahn and it was Dr.  Clarisa Kindred impression that her symptoms were not completely consistent  with a physical exam finding and he emphasized a CAT scanning and most  recently MRI scanning, referred her to Dr. Gentry Roch at [facility].  The patient has not been yet seen by Dr. Daphine Deutscher though this  appointment is scheduled in the next couple of weeks.  There has been no  frank syncope in the interim.   PHYSICAL EXAMINATION:  GENERAL:  Today she is a pleasant, middle-aged  woman in no acute distress.  VITAL SIGNS:  Her blood pressure was 140/100, pulse was 100 and regular,  respirations were 18, and weight was 161  pounds.  NECK:  No jugular vein distention.  LUNGS:  Clear bilaterally to auscultation.  No wheezes, rales, or  rhonchi are present.  There is no increased work of breathing.  CARDIOVASCULAR:  Regular rate and rhythm.  Normal S1 and S2.  EXTREMITIES:  No edema.   The EKG demonstrates sinus rhythm.   IMPRESSION:  1. Dizziness and lightheadedness after episode of otitis back in the      summer.  2. History of anxiety and depression.  3. History of lower back problems, status post surgery in the past.   DISCUSSION:  Today I have asked Ms. Kemnitz to obtain CBG rather to  obtain a BMET to make sure that she is not hyperglycemic.  Also, we will  check her TSH.  I have encouraged her to follow up with Dr. Gentry Roch at [facility], who has been scheduled by Dr. Anne Hahn for  additional followup.  I have plan to see her back as needed.  I do not  think her problems are  related to her cardiovascular system.     Doylene Canning. Ladona Ridgel, MD  Electronically Signed    GWT/MedQ  DD: 07/06/2008  DT: 07/07/2008  Job #: [id number]   cc:   Gentry Roch, MD

## 2010-10-31 NOTE — Assessment & Plan Note (Signed)
[facility]                             PULMONARY OFFICE NOTE   NAME:[name], [name] [name]                     MRN:          [id number]  DATE:04/04/2007                            DOB:          15-Mar-1956    PULMONARY CONSULTATION   HISTORY OF PRESENT ILLNESS:  The patient is a 55 year old white female  whom I have been asked to see for chest pain and an abnormal CT scan.  The patient states that she has had apical chest pain for the last few  years, but the last 1 year, it has been worse.  She primarily points to  her epigastrium and describes this as a dull pressure.  The patient has  had a cardiac workup with a negative stress test approximately 2 years  ago.  The patient also complains of a lump in her throat, and does carry  the diagnosis of GERD with esophageal erosions noted approximately 1  year ago by history.  She is taking AcipHex once a day and is eating  Tums every single day with persistent breakthrough.  The patient also  was recently having night sweats, and had a PPD placed with a positive  skin test being found.  However, the patient is wondering whether or not  this may have been a hypersensitivity reaction, in that there was a very  significant response very shortly after placing the PPD.  The patient  has no known exposure to tuberculosis.  She denies any cough, fevers,  chills, or significant mucus.  She has no difficulties bringing  groceries from the car or vacuuming a room of her house, or making a  bed.  She has some decrease in exercise tolerance with heavy exertion  such as that occurs at work.  The patient did have a chest x-ray done  that was totally unremarkable.  She had a CT scan of her head that did  show some mild to moderate mucosal thickening noted in the ethmoid and  sphenoid sinuses.  She subsequently underwent a CT scan of the chest  that is not available to me today, but is described by [facility]  radiology as  having some mild emphysematous changes with areas of  infiltrates/fibrosis.  The patient has not had any significant weight  loss or anorexia.   PAST MEDICAL HISTORY:  1. Significant for dyslipidemia.  2. History of allergic rhinitis.  3. History of sleep apnea.  4. History of spine surgery x2.  5. Status post hysterectomy.   CURRENT MEDICATIONS:  1. AcipHex 20 mg daily.  2. Lorazepam 2 mg b.i.d.  3. Vytorin 10/40 one daily.  4. Fluoxetine 40 mg daily.  5. Xyxal 5 mg daily.  6. Isoniazid 300 mg daily.  7. Ibuprofen p.r.n.   The patient has an allergy to SULFA and has the description of ASPIRIN  intolerance.  Otherwise, there are no known drug allergies.   SOCIAL HISTORY:  She is married and has children.  She works at [employer].  She lives with her husband.  She has a history  of smoking 1 pack every  3 days of cigarettes.   FAMILY HISTORY:  Remarkable for mother having emphysema and father  having asthma.  Otherwise, noncontributory.   REVIEW OF SYSTEMS:  As per history of present illness.  Also see patient  intake form and documented on the chart.   PHYSICAL EXAM:  GENERAL:  She is a well-developed female in no acute  distress.  Blood pressure 150/92, pulse 78, temperature 98.2.  Weight is 152  pounds.  O2 saturation on room air is 98%.  HEENT:  Pupils are equal, round, and reactive to light and  accommodation.  Extraocular muscles are intact.  Nares are patent  without discharge.  Oropharynx is clear, except for a trimmed palate.  NECK:  Supple without JVD or lymphadenopathy.  No palpable thyromegaly.  CHEST:  Totally clear.  CARDIAC:  Regular rate and rhythm with no murmurs, rubs, or gallops.  ABDOMEN:  Soft and nontender with good bowel sounds.  GENITAL, RECTAL, AND BREASTS:  Not done and not indicated.  LOWER EXTREMITIES:  Without edema.  Pulses are intact distally.  NEUROLOGIC:  Alert and oriented with no obvious motor deficits.   IMPRESSION:   Abnormal CT scan with a question of infiltrates or fibrosis  noted by the radiologist.  I do not have the scan available to me.  This  will have to be reviewed in order to come up with some type of  differential diagnosis.  With her significant gastroesophageal reflux  disease symptoms, despite being on proton pump inhibitors, I would  wonder if she is having micro aspiration at night of gastric contents.  The other possibility is whether or not this is related to her positive  PPD, but she certainly does not have true symptoms of active  tuberculosis.  At this point in time, I have told the patient that I  will need to get the disk for her x-rays from [facility],  and then get her back in so that we can do further discussion.   PLAN:  1. We will obtain the disk from [facility] for her CT scan      and we will review thoroughly.  2. I think the patient needs to be on b.i.d. proton pump inhibitor and      AcipHex samples are not available in my office today.  I did give      her samples of Zegerid 40 mg that she can take      b.i.d. for now, and see if this does a better job of suppressing      her acid production.  3. The patient will follow up after the above.     Barbaraann Share, MD,FCCP  Electronically Signed    [facility]/MedQ  DD: 04/16/2007  DT: 04/16/2007  Job #: [id number]   cc:   Aida Puffer

## 2010-10-31 NOTE — Assessment & Plan Note (Signed)
[facility]                             PULMONARY OFFICE NOTE   NAME:[name] [name], [name] [name]                     MRN:          [id number]  DATE:04/15/2007                            DOB:          November 30, 1955    SUBJECTIVE:  Ms. Litzenberger returns today for further discussion of her  abnormal CT scan.  I did obtain films from [facility] after  great difficulties with a non-working CD.  The patient is noted to have  very faint and subtle ground-glass densities that are very patchy and  noted to be bilateral in nature.  Otherwise, it is totally unremarkable.  I have discussed this with her at great length and answered all of her  questions.  The patient states that her reflux is much improved since  being on the b.i.d. Zegerid.  She also thinks that she may be a little  bit more symptomatic from a pulmonary standpoint than what she had let  on last time that we had a discussion.   PHYSICAL EXAM:  IN GENERAL:  She is a well-developed female, in no acute  distress.  Blood pressure is 130/88, pulse 84, temperature is 98, weight is 161  pounds, O2 saturation on room air is 100%.  CHEST:  Totally clear to auscultation.  CARDIAC EXAM:  Reveals regular rate and rhythm.   IMPRESSION:  Patchy and very faint ground-glass densities noted on CT  scan, of unknown etiology.  The differential for this is very wide and  includes anything from inflammatory lung processes to infection.  The  pattern is really not consistent with active tuberculosis, and appears  more inflammatory in nature.  There is really nothing to make me have  concern about the possibility of malignancy.  I continue to wonder  whether this may be due to micro-aspiration from her very extensive  reflux disease, whether this is a subtle viral process that is  resolving, whether this may be some type of alveolitis, related to a  connective tissue disease, or whether she may have some type of  inflammatory lung disease, which could be anything.  I have had a very  long discussion with her about this, and feel the infiltrates are very,  very subtle and her symptoms are really not overwhelming.  At this point  in time, I would prefer the conservative approach.  I have outlined for  her the possibility of checking a few connective tissue labs, as well as  an IgE level, and approaching this in a conservative manner with  surveillance.  The other alternative would be to set up for fiberoptic  bronchoscopy with bronchoalveolar lavage and possible biopsy.  I have  also told the patient the level of aggressiveness will depend upon her  symptomatology and what else may pop up over the coming months.  The  patient is agreeable to the conservative approach and I have tried to  reassure her as much as possible.   PLAN:  1. We will check a repeat sed rate, ANA, rheumatoid factor,      angiotensin converting enzymes, an IgE level today.  I will call      the patient with the results and we can further discuss the level      of aggressiveness.  2. The patient will call me if she has any worsening of her symptoms      or even new symptoms.  3. I think the patient should be maintained on a b.i.d. proton pump      inhibitor, and she can      either continue with b.i.d. Zegerid or increase her AcipHex to      b.i.d.  4. I will call the patient after the above lab results for further      discussion.     Barbaraann Share, MD,FCCP  Electronically Signed    [facility]Med[facility]  DD: 04/16/2007  DT: 04/16/2007  Job #: [id number]   cc:   Aida Puffer

## 2010-10-31 NOTE — Letter (Signed)
June 19, 2007    Donalee Citrin, MD  [address], [place] [address]   RE:  [name] [name], [name] [name]  MRN:  [id number]  /  DOB:  May 08, 1956   Dear Dr. Wynetta Emery,   Thank you for referring, [name] [name], for evaluation of dizziness and  difficulty with gait imbalance.  Please note that I have sent a copy of  my clinic visit to you, which should be forthcoming.  He recalled that I  took your call several weeks ago when he had called tried to arrange for  head-up tilt table test on the patient.  Of course, the patient had a  history of lower back problems dating back to 2002 when she underwent  surgery and apparently had a repeat operation in either 2004 or 2006.  The patient tells that she had seen Dr. Anne Hahn, who had been referred to  her by Dr. Clent Ridges.  After her evaluation with Dr. Anne Hahn, who is, as I  referred you momentarily, seen in your office, hence the referral for  possible tilt table testing.   The patient's history seems to really have occurred with problems with  dizziness and balance nearly following since she developed a Staph  infection in her ear dating back to August of this year.  She apparently  recovered from this, but since then has had problems with the above-  mentioned problems.  She also notes that she was diagnosed with Bell  palsy.  During this, raising the question as to which came first the  Bell palsy, secondary Staph infection, or some other combination of  events.  In my office, the patient underwent orthostatic blood pressure  and heart rate testing.   The patient did not have any evidence of orthostasis with no significant  change in her heart regular, blood pressure sitting, standing, or with  prolonged standing.  These results along with her clinical history make  the likely the diagnostic yield at the tilt table testing specifically  to diagnosis orthostasis or neurally mediated syncope very unlikely.   I discussed the case with Dr. Lesia Sago, who was seen the patient.  His findings are fairly inconclusive, in fact his comment to me was that  he was confused by the exam findings in conjunction with her symptoms.  For this reason, Dr. Anne Hahn tells me that he had recommended she get a  second opinion at [facility] with one neurologist at that  institution.   SUMMARY:  I do not think [name] [name] has neurally-mediated syncope in  fact she has not had any actual syncopal episodes.  It is very clear  that her symptoms developed after her ear infection.  The patient, of  course, is concerned about not being able to work.  At this point,  because of the problem appears very much to be a neurologic issue with  regard to perhaps an inner ear problem from her infection.  I am going  to defer making any final disposition on with the patient.  Do not  hesitate to contact me with regards to additional questions regarding  [name] [name].    Sincerely,      Doylene Canning. Ladona Ridgel, MD  Electronically Signed    [facility]/MedQ  DD: 06/18/2008  DT: 06/18/2008  Job #: [id number]

## 2010-11-03 NOTE — Op Note (Signed)
[name] [name], [name] [name]              ACCOUNT NO.:  [id number]   MEDICAL RECORD NO.:  [id number]          PATIENT TYPE:  OIB   LOCATION:  NA                           FACILITY:  [facility]   PHYSICIAN:  Donalee Citrin, M.D.        DATE OF BIRTH:  15-Nov-1955   DATE OF PROCEDURE:  09/18/2004  DATE OF DISCHARGE:                                 OPERATIVE REPORT   PREOPERATIVE DIAGNOSIS:  Left C6 radiculopathy from cervical spondylosis and  foraminal stenosis C5-6.   POSTOPERATIVE DIAGNOSIS:  Left C6 radiculopathy from cervical spondylosis  and foraminal stenosis C5-6.   OPERATION PERFORMED:  Anterior cervical diskectomy and fusion at C5-6 using  a 5 mm LifeNet wedge with 22 mm Atlantis Vision plate and four 13 mm  variable angle screws.   SURGEON:  Donalee Citrin, M.D.   ASSISTANT:  Kathaleen Maser. Pool, M.D.   ANESTHESIA:  General endotracheal.   INDICATIONS FOR PROCEDURE:  The patient is a very pleasant 55 year old  female who has had longstanding neck and left arm radiating down the left  thumb and forefinger with numbness and tingling in the same distribution,  preoperative weakness of triceps.  Due to the patient's failure of  conservative treatment __________ exam and MRI showing severe foraminal  stenosis and spondylitic disease at C5-6, the patient recommended anterior  cervical diskectomy and fusion at C5-6.  Risks and benefits explained to the  patient.  She understands and agreed to proceed forward.   DESCRIPTION OF PROCEDURE:  The patient was brought to the operating room was  induced under general anesthesia, placed supine with neck in slight  extension with five pounds of halter traction.  The right side of the neck  was prepped and draped in the usual sterile fashion after preoperative x-ray  localized the C4-5 interspace.  An incision was made just inferior to this  in a curvilinear fashion just off the midline to the sternocleidomastoid  muscle and the superficial layer of the platysmas  was dissected out and  divided longitudinally.  The avascular plane between the sternocleidomastoid  muscle and the strap muscles was developed down to prevertebral fascia.  The  prevertebral fascia was incised with Kitners.  Intraoperative x-ray  confirmed localization of the C5-6 disk space and an annulotomy was made to  mark the disk space.  The remainder of the longus colli was reflected  laterally.  Then the self-retaining retractor was placed and the annulotomy  was extended.  Pituitary rongeurs were used to remove the anterior margin of  the  annulus.  The interspace was drilled down with a high speed drill.  The  posterior annulus, posterior osteophytic complex.  Then using a 1 mm  Kerrison punch the osteophyte coming off the C5 vertebral body was  underbitten as well as C6 exposing the posterior longitudinal ligament which  was  removed in piecemeal fashion exposing the thecal sac.  Then the  operating microscope was draped and brought into the field and under  microscopic illumination the remainder of the thecal sac was decompressed.  The proximal aspect of the C6  nerve root noted to be some significant  foraminal stenosis coming off uncinate hypertrophy at this site and this was  all underbitten with a 1 and 2 mm Kerrison punches.  Then the C6 neural  foramen was widely opened and the nerve hook was used to explore the nerve  root.  The neural foramen noted to be widely patent.  Then the interspace  was cleaned up and decompressed at the proximal aspect of the right C6 nerve  root.  After this was completed, __________ prepared to receive bone graft.  A 5 mm LifeNet wedge was inserted 1 mm deep to the anterior vertebral body  line and a 22 mm Atlantis plate was inserted with four 13 mm variable angle  screws.  All screws had excellent purchase.  All screws tightened down.  The  wound was copiously irrigated and meticulous hemostasis was maintained.  The  platysma was  reapproximated with 3-0 interrupted Vicryl and the skin was  closed with running 4-0 subcuticular.  Postoperative fluoroscopy confirmed  good position of the plate, screws and bone graft.  Then the patient went to  the recovery room in stable condition.  At the end of the case, sponge,  needle and instrument counts were correct.      GC/MEDQ  D:  09/18/2004  T:  09/18/2004  Job:  [id number]

## 2010-11-03 NOTE — H&P (Signed)
[name] [name], [name] [name]              ACCOUNT NO.:  [id number]   MEDICAL RECORD NO.:  [id number]          PATIENT TYPE:  AMB   LOCATION:  SDS                          FACILITY:  [facility]   PHYSICIAN:  Hermelinda Medicus, M.D.   DATE OF BIRTH:  [birth year]   DATE OF ADMISSION:  03/01/2005  DATE OF DISCHARGE:                                HISTORY & PHYSICAL   HISTORY OF PRESENT ILLNESS:  This patient is a 55 year old female who  entered my office having been diagnosed with sleep apnea with a lowest O2 of  70% and RDI of 17, and a lowest heart rate of 20 and a situation where she  has been on CPAP but cannot tolerate this.  She also has a considerable  sleep deprivation history where she gets very fatigued.  She also has a  history of when she does not use her CPAP or when she pushes it away of  frightening her husband because of the severe apnea situation.  She also has  reflux esophagitis.  She also has lost considerable amount of weight.  She  approached 40 pounds in an effort to bring this situation under control, but  still has numbers that are very concerning.  She wishes now as she has a  severe septal deviation with septal adhesions to the right turbinate and a  very low uvula and palate with a very small narrow mouth, to have a septal  reconstruction and turbinate reduction with a palatal pharyngoplasty.   ALLERGIES:  SULFA causing swelling, LIPITOR causing muscle aches.   PAST SURGICAL HISTORY:  C5-6 fusion in 2006, lumbar laminectomy in 2002, and  hysterectomy in the past.   Chest x-ray is looking within normal limits.   PAST MEDICAL HISTORY:  Quite unremarkable except for these sleeping  symptoms.   PHYSICAL EXAMINATION:  VITAL SIGNS:  Blood pressure 99/70, pulse 70, weight  133, 63 inches tall.  HEENT:  Ears are clear, tympanic membranes are clear.  Nose shows a severe  septal deviation to the right with obstruction and adhesions to the right  inferior turbinate and the left  columella deviation.  The palate and uvula  are very low.  Palate is low in the mouth and oral cavity is narrow and  shallow.  NECK:  Free of any thyromegaly or cervical adenopathy or mass, but she does  have a history of a scar of the fusion of C5-6.  Larynx was clear, true  cords, false cords, epiglottis, base of tongue are clear, two-cord mobility.  Gag reflex humbled, but extraocular muscles and facial nerve are all  symmetrical.  CHEST:  Clear with no rales, rhonchi, or wheezes.  Chest x-ray is clear.  CARDIOVASCULAR:  No murmurs, rubs, or gallops.  Regular sinus rhythm.  ABDOMEN:  Unremarkable.  EXTREMITIES:  Unremarkable.   ADMISSION DIAGNOSIS:  Sleep apnea, failed CPAP, septal deviation with septal  adhesions, low uvula and palate, status post C5-6 fusion in 2006, status  post lumbar laminectomy in 2002, status post hysterectomy.           ______________________________  Hermelinda Medicus, M.D.  JC/MEDQ  D:  03/01/2005  T:  03/01/2005  Job:  [id number]   cc:   Thereasa Solo. Little, M.D.  [address]  [address]  [place]  [place] [address]  Fax: [phone]

## 2010-11-03 NOTE — Op Note (Signed)
NAMEGILIANA, VANTIL              ACCOUNT NO.:  [id number]   MEDICAL RECORD NO.:  [id number]          PATIENT TYPE:  OIB   LOCATION:  2550                         FACILITY:  [facility]   PHYSICIAN:  Hermelinda Medicus, M.D.   DATE OF BIRTH:  Oct 04, 1955   DATE OF PROCEDURE:  03/01/2005  DATE OF DISCHARGE:                                 OPERATIVE REPORT   PREOPERATIVE DIAGNOSIS:  Sleep apnea, failed CPAP, septal deviation, septal  adhesion, low uvula and palate.  O2 nadir 70%, RDI 17, lowest heart rate 20.   POSTOPERATIVE DIAGNOSIS:  Sleep apnea, failed CPAP, septal deviation, septal  adhesion, low uvula and palate.  O2 nadir 70%, RDI 17, lowest heart rate 20.   OPERATION PERFORMED:  Septal reconstruction, turbinate reduction, lysis of  adhesions with uvulopalatopharyngoplasty.   SURGEON:  Hermelinda Medicus, M.D.   ANESTHESIA:  General.   ANESTHESIOLOGIST:  Zenon Mayo, MD   DESCRIPTION OF PROCEDURE:  Patient placed in supine position.  Under general  endotracheal anesthesia, the nose was first approached where a  hemitransfixion incision was made on the right side, carried back around the  columella which was grossly deviated and angulated and this was trimmed back  as it was blocking the left side of her nose considerably.  This was then  brought back into the midline.  We then elevated the mucous membrane  posteriorly on the right side and the septum was pushed way over to the  right.  This was mobilized off the maxillary crest back to the premaxillary  crest and spurs were removed using the 4 mm chisel.  Also a piece of  quadrilateral cartilage was taken and then the ethmoid septum was also  trimmed using the open and closed Jansen-Middletons.  Once the septum was  established back in the midline.  Then we lysed adhesions.  We could  establish the septum back in the midline and give some space on this right  side.  The vomerine septum was also trimmed and brought back to the  midline.  The septum was established on the midline the premaxilla.  Once this was  completed, the inferior turbinates were also outfractured, also the Elmed  was used.  Bipolar cautery was used to shrink some of the excessive membrane  and then closure was completed with 5-0 plain catgut, through-and-through  septal suture with 4-0 plain times two as a hemostatic stitch.  Telfa was  placed within the nose.  Attention was then carried to the oral cavity where  the uvula and palate were trimmed as the palatal contour was very low and  this was raised approximately 7 mm.  The uvula was also trimmed to  approximately half its size approaching back to more normal.  Closure of the  anterior posterior uvula mucous membrane was with 5-0 plain catgut and then  all hemostasis was established with Bovie electrocoagulation.  The  nasopharynx was suctioned.  The stomach was suctioned and we removed the  Telfa and placed two anesthesia trumpets.  These will be removed after the  patient is fully awake.  The patient tolerated  the procedure very well and  is doing well postoperatively and will be kept in 3300, pulmonary step down  intensive care where pulse oximeter observation will be maintained.  The  patient is aware of the risks and gains and is aware that her diet will be  limited for the next 10 days and she may not travel long distances for the  next 10 days and that her nasal airway will be somewhat limited with  swelling.  She also definitely will need to be on a  soft bland diet.  All risks and gains were addressed for her and the concept  is that she may still snore slightly but will not need to continue the CPAP.  If there is any question on this, we can repeat her polysomnogram.  Her  follow-up will be tomorrow, then in one week, three weeks, six weeks, three  months, six months and a year.           ______________________________  Hermelinda Medicus, M.D.     JC/MEDQ  D:  03/01/2005  T:   03/01/2005  Job:  [id number]   cc:   Fayrene Fearing Little  [address]  [place]  [place] [address]  Fax: [phone]

## 2010-11-03 NOTE — Op Note (Signed)
[place]. [facility]  Patient:    [name] [name], [name] [name] Visit Number: [id number] MRN: [id number]          Service Type: DSU Location: 3000 [id number] Attending Physician:  Mariam Dollar Dictated by:   Garlon Hatchet., M.D. Proc. Date: 05/06/01 Admit Date:  05/06/2001                             Operative Report  PREOPERATIVE DIAGNOSIS:  S1 radiculopathy left from a large ruptured disk at L5-S1 left.  PROCEDURE:  Lumbar laminectomy and microdiskectomy L5-S1 left, ______ dissection left S1 nerve root.  HISTORY OF PRESENT ILLNESS:  The patient is a very pleasant 55 year old female, who has had longstanding back and left leg pain radiating down to the bottom of the foot with numbness, tingling, and ______ .  She failed anti-inflammatories and conservative treatment.  Extensively counselled to the risks and benefits after preoperative imaging showed a very large ruptured disk at L5-S1 compressing the left S1 nerve root.  She was recommended surgery.  Explained the risks and benefits and the patient understands and agreed to proceed forward.  DESCRIPTION OF PROCEDURE:  The patient was brought in the OR and was induced under general anesthesia, positioned prone on the Wilson frame.  Her back was prepped and draped using sterile fashion.  Preoperative imaging showed needle location over the L5-S1 disk space.  A midline incision was made centering over this after infiltration of 10 cc lidocaine with epinephrine.  Bovie electrocautery was used to take down the subcutaneous tissue.  Subperiosteal dissection was carried out in the lamina of L5 and S1 on the left side. High-speed drill was ______ L5-S1.  Initial intraoperative x-ray showed the needle in the L4-5 disk space then the retractor was then moved one interspace below this and repeat x-ray confirmed L5-S1.  Then, a high-speed drill was used to drill in the inferior aspect spinous process lamina of L5 and  the medial aspect of facet complex using a 3 and 4 mm Kerrison punch the remainder of the inferior aspect of lamina of L5 ______ visualized and removed in a piecemeal fashion.  Thecal sac was visualized.  Operating microscope was draped and brought in the field.  Under ______ , the remainder of the thecal sac was visualized and decompressed.  The S1 nerve root was identified and noted to be under a severe amount of compression from a large disk rupture underneath it.  The four Nicholos Johns was used to tease the S1 nerve root medially and DErrico nerve root retractor was used to reflect it medially and ______ 11-blade scalpel and several large fragments of disk were removed with an angled nerve hook and pituitary rongeurs.  The disk space was adequately cleaned out, end plates were scraped off with downgoing Epstein curet both medially and laterally and cephalad/caudad.  At the end of diskectomy, there was no further disk fragments appreciated and it was explored with an angled hockey-stick and coronary dilator medially cephalad and caudad and the S1 neural foramen was noted to be widely patent.  Meticulous hemostasis was maintained, Gelfoam was overlaid on top of the dura, the fascia was closed with 0 interrupted Vicryl and subcutaneous tissue was closed with 2-0 interrupted Vicryl.  The skin was closed with a running 4-0 subcuticular. Benzoin and Steri-Strips were applied.  The patient went to recovery room in stable condition.  At the end of the case, all  needle counts and sponge counts were correct. Dictated by:   Garlon Hatchet., M.D. Attending Physician:  Mariam Dollar DD:  05/06/01 TD:  05/06/01 Job: [id number]

## 2010-11-21 ENCOUNTER — Other Ambulatory Visit: Payer: Self-pay | Admitting: *Deleted

## 2010-11-21 ENCOUNTER — Telehealth: Payer: Self-pay | Admitting: *Deleted

## 2010-11-21 MED ORDER — SIMVASTATIN 40 MG PO TABS: 40.0000 mg | ORAL_TABLET | Freq: Every day | ORAL | Status: DC

## 2010-11-21 NOTE — Telephone Encounter (Signed)
Simvastatin 40mg  phoned in due to [2] failed attempts on escribing.

## 2010-11-21 NOTE — Telephone Encounter (Signed)
Rx Done . 

## 2010-12-18 ENCOUNTER — Telehealth: Payer: Self-pay | Admitting: *Deleted

## 2010-12-18 NOTE — Telephone Encounter (Signed)
pls advise; xanax and hydrocodone last sent on 06/26/2010 x5rf

## 2010-12-18 NOTE — Telephone Encounter (Signed)
Needs xanax, simvastatin and hyrocodone and omeprazole sent to [facility] [phone].  Appt for cpx schedule for 01/23/11.

## 2010-12-19 MED ORDER — OMEPRAZOLE 20 MG PO CPDR: 20.0000 mg | DELAYED_RELEASE_CAPSULE | Freq: Two times a day (BID) | ORAL | Status: DC

## 2010-12-19 MED ORDER — ALPRAZOLAM 2 MG PO TABS: 2.0000 mg | ORAL_TABLET | Freq: Three times a day (TID) | ORAL | Status: DC | PRN

## 2010-12-19 MED ORDER — HYDROCODONE-ACETAMINOPHEN 10-660 MG PO TABS: 1.0000 | ORAL_TABLET | Freq: Three times a day (TID) | ORAL | Status: DC | PRN

## 2010-12-19 MED ORDER — SIMVASTATIN 40 MG PO TABS: 40.0000 mg | ORAL_TABLET | Freq: Every day | ORAL | Status: DC

## 2010-12-19 NOTE — Telephone Encounter (Signed)
rx's called in . 

## 2010-12-19 NOTE — Telephone Encounter (Signed)
Call in 30 days of each of these

## 2010-12-21 ENCOUNTER — Telehealth: Payer: Self-pay | Admitting: Family Medicine

## 2010-12-21 NOTE — Telephone Encounter (Signed)
Spoke with pt and she requested script for Zocor to be faxed to [facility], [phone]

## 2011-01-01 ENCOUNTER — Other Ambulatory Visit: Payer: Self-pay | Admitting: Obstetrics and Gynecology

## 2011-01-01 DIAGNOSIS — Z1231 Encounter for screening mammogram for malignant neoplasm of breast: Secondary | ICD-10-CM

## 2011-01-09 ENCOUNTER — Encounter: Payer: Self-pay | Admitting: Family Medicine

## 2011-01-09 ENCOUNTER — Ambulatory Visit (INDEPENDENT_AMBULATORY_CARE_PROVIDER_SITE_OTHER): Payer: Medicare Other | Admitting: Family Medicine

## 2011-01-09 VITALS — BP 104/76 | HR 101 | Temp 98.6°F | Wt 168.0 lb

## 2011-01-09 DIAGNOSIS — M545 Low back pain, unspecified: Secondary | ICD-10-CM

## 2011-01-09 DIAGNOSIS — B009 Herpesviral infection, unspecified: Secondary | ICD-10-CM

## 2011-01-09 MED ORDER — ACYCLOVIR 400 MG PO TABS: 400.0000 mg | ORAL_TABLET | Freq: Four times a day (QID) | ORAL | Status: AC

## 2011-01-09 NOTE — Progress Notes (Signed)
  Subjective:    Patient ID: [name] [name], female    DOB: 26-Sep-1955, 55 y.o.   MRN: [id number]  HPI Here for 2 reasons. 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 She has had a breakout for the past 3 days. It is quite sore. Also she asks for a referral from me to [facility] for some more lumbar epidural steroid shots. She had been seeing Dr. Donalee Citrin, who had originally ordered the epidural shots. She got the first one in January, and it worked well for a few months. Now the pain is back and she would like some more shots. However Dr. Wynetta Emery does not participate with her new insurance, and she asks me to do this referral instead.    Review of Systems  Constitutional: Negative.   Musculoskeletal: Positive for back pain.  Skin: Positive for rash.       Objective:   Physical Exam  Constitutional: She appears well-developed and well-nourished.  HENT:       The tip of the nose or red and swollen, with a few vessicles visible           Assessment & Plan:  Treat the herpes with Acyclovir. We will refer for more ESI's as above.

## 2011-01-15 ENCOUNTER — Telehealth: Payer: Self-pay | Admitting: Family Medicine

## 2011-01-15 ENCOUNTER — Ambulatory Visit (HOSPITAL_COMMUNITY)
Admission: RE | Admit: 2011-01-15 | Discharge: 2011-01-15 | Disposition: A | Discharge status: Home / Self Care | Payer: Medicare Other | Source: Ambulatory Visit | Attending: Obstetrics and Gynecology | Admitting: Obstetrics and Gynecology

## 2011-01-15 DIAGNOSIS — Z1231 Encounter for screening mammogram for malignant neoplasm of breast: Secondary | ICD-10-CM

## 2011-01-15 NOTE — Telephone Encounter (Signed)
Pt requesting refill on Hydrocodone and Alprazolam 2mg , is having extreme back pain.

## 2011-01-16 ENCOUNTER — Other Ambulatory Visit (INDEPENDENT_AMBULATORY_CARE_PROVIDER_SITE_OTHER): Payer: Medicare Other

## 2011-01-16 DIAGNOSIS — Z0000 Encounter for general adult medical examination without abnormal findings: Secondary | ICD-10-CM

## 2011-01-16 DIAGNOSIS — Z79899 Other long term (current) drug therapy: Secondary | ICD-10-CM

## 2011-01-16 LAB — CBC WITH DIFFERENTIAL/PLATELET
Basophils Absolute: 0 10*3/uL (ref 0.0–0.1)
Basophils Relative: 0.3 % (ref 0.0–3.0)
Eosinophils Absolute: 0 10*3/uL (ref 0.0–0.7)
Eosinophils Relative: 0.5 % (ref 0.0–5.0)
HCT: 43.1 % (ref 36.0–46.0)
Hemoglobin: 14.2 g/dL (ref 12.0–15.0)
Lymphocytes Relative: 18.8 % (ref 12.0–46.0)
Lymphs Abs: 1.8 10*3/uL (ref 0.7–4.0)
MCHC: 33 g/dL (ref 30.0–36.0)
MCV: 93.8 fl (ref 78.0–100.0)
Monocytes Absolute: 0.4 10*3/uL (ref 0.1–1.0)
Monocytes Relative: 3.8 % (ref 3.0–12.0)
Neutro Abs: 7.1 10*3/uL (ref 1.4–7.7)
Neutrophils Relative %: 76.6 % (ref 43.0–77.0)
Platelets: 177 10*3/uL (ref 150.0–400.0)
RBC: 4.6 Mil/uL (ref 3.87–5.11)
RDW: 13.8 % (ref 11.5–14.6)
WBC: 9.3 10*3/uL (ref 4.5–10.5)

## 2011-01-16 LAB — POCT URINALYSIS DIPSTICK
Bilirubin, UA: NEGATIVE
Blood, UA: NEGATIVE
Glucose, UA: NEGATIVE
Ketones, UA: NEGATIVE
Leukocytes, UA: NEGATIVE
Nitrite, UA: NEGATIVE
Protein, UA: NEGATIVE
Spec Grav, UA: 1.02
Urobilinogen, UA: 0.2
pH, UA: 5.5

## 2011-01-16 LAB — HEPATIC FUNCTION PANEL
ALT: 29 U/L (ref 0–35)
AST: 30 U/L (ref 0–37)
Albumin: 4.8 g/dL (ref 3.5–5.2)
Alkaline Phosphatase: 72 U/L (ref 39–117)
Bilirubin, Direct: 0 mg/dL (ref 0.0–0.3)
Total Bilirubin: 0.2 mg/dL — ABNORMAL LOW (ref 0.3–1.2)
Total Protein: 7.9 g/dL (ref 6.0–8.3)

## 2011-01-16 LAB — BASIC METABOLIC PANEL
BUN: 15 mg/dL (ref 6–23)
CO2: 27 mEq/L (ref 19–32)
Calcium: 9.7 mg/dL (ref 8.4–10.5)
Chloride: 102 mEq/L (ref 96–112)
Creatinine, Ser: 0.7 mg/dL (ref 0.4–1.2)
GFR: 98.61 mL/min (ref >60.00)
Glucose, Bld: 100 mg/dL — ABNORMAL HIGH (ref 70–99)
Potassium: 4.3 mEq/L (ref 3.5–5.1)
Sodium: 139 mEq/L (ref 135–145)

## 2011-01-16 LAB — TSH: TSH: 1.45 u[IU]/mL (ref 0.35–5.50)

## 2011-01-16 LAB — LIPID PANEL
Cholesterol: 212 mg/dL — ABNORMAL HIGH (ref 0–200)
HDL: 58.5 mg/dL (ref >39.00)
Total CHOL/HDL Ratio: 4
Triglycerides: 119 mg/dL (ref 0.0–149.0)
VLDL: 23.8 mg/dL (ref 0.0–40.0)

## 2011-01-16 LAB — LDL CHOLESTEROL, DIRECT: Direct LDL: 141.9 mg/dL

## 2011-01-16 MED ORDER — ALPRAZOLAM 2 MG PO TABS: 2.0000 mg | ORAL_TABLET | Freq: Three times a day (TID) | ORAL | Status: DC | PRN

## 2011-01-16 MED ORDER — HYDROCODONE-ACETAMINOPHEN 10-660 MG PO TABS: 1.0000 | ORAL_TABLET | Freq: Three times a day (TID) | ORAL | Status: DC | PRN

## 2011-01-16 NOTE — Telephone Encounter (Signed)
Call in Vicodin 10/660 tid prn pain, #90 with 5 rf, also Alprazolam 2 mg tid, #90 with 5 rf

## 2011-01-16 NOTE — Telephone Encounter (Signed)
done

## 2011-01-16 NOTE — Telephone Encounter (Signed)
Addended by: Beverely Low on: 01/16/2011 03:04 PM   Modules accepted: Orders

## 2011-01-23 ENCOUNTER — Encounter: Payer: Self-pay | Admitting: Family Medicine

## 2011-01-23 ENCOUNTER — Other Ambulatory Visit: Payer: Self-pay | Admitting: Family Medicine

## 2011-01-23 ENCOUNTER — Ambulatory Visit (INDEPENDENT_AMBULATORY_CARE_PROVIDER_SITE_OTHER): Payer: Medicare Other | Admitting: Family Medicine

## 2011-01-23 VITALS — BP 112/74 | HR 97 | Temp 98.1°F | Ht 62.5 in | Wt 167.0 lb

## 2011-01-23 DIAGNOSIS — Z79899 Other long term (current) drug therapy: Secondary | ICD-10-CM

## 2011-01-23 DIAGNOSIS — Z0000 Encounter for general adult medical examination without abnormal findings: Secondary | ICD-10-CM

## 2011-01-23 DIAGNOSIS — R5383 Other fatigue: Secondary | ICD-10-CM

## 2011-01-23 DIAGNOSIS — R531 Weakness: Secondary | ICD-10-CM

## 2011-01-23 DIAGNOSIS — M545 Low back pain, unspecified: Secondary | ICD-10-CM

## 2011-01-23 DIAGNOSIS — R5381 Other malaise: Secondary | ICD-10-CM

## 2011-01-23 LAB — VITAMIN B12: Vitamin B-12: 846 pg/mL (ref 211–911)

## 2011-01-23 NOTE — Progress Notes (Signed)
  Subjective:    Patient ID: [name] [name], female    DOB: 1955/10/01, 55 y.o.   MRN: [id number]  HPI 55 yr old female for a cpx. She feels well except for some generalized fatigue. She asks if we can check some vitamin levels in her body.    Review of Systems  Constitutional: Positive for fatigue. Negative for fever, chills, diaphoresis, activity change, appetite change and unexpected weight change.  HENT: Negative.   Eyes: Negative.   Respiratory: Negative.   Cardiovascular: Negative.   Gastrointestinal: Negative.   Genitourinary: Negative for dysuria, urgency, frequency, hematuria, flank pain, decreased urine volume, enuresis, difficulty urinating, pelvic pain and dyspareunia.  Musculoskeletal: Negative.   Skin: Negative.   Neurological: Negative.   Hematological: Negative.   Psychiatric/Behavioral: Negative.        Objective:   Physical Exam  Constitutional: She is oriented to person, place, and time. She appears well-developed and well-nourished. No distress.  HENT:  Head: Normocephalic and atraumatic.  Right Ear: External ear normal.  Left Ear: External ear normal.  Nose: Nose normal.  Mouth/Throat: Oropharynx is clear and moist. No oropharyngeal exudate.  Eyes: Conjunctivae and EOM are normal. Pupils are equal, round, and reactive to light. No scleral icterus.  Neck: Normal range of motion. Neck supple. No JVD present. No thyromegaly present.  Cardiovascular: Normal rate, regular rhythm, normal heart sounds and intact distal pulses.  Exam reveals no gallop and no friction rub.   No murmur heard.      EKG normal   Pulmonary/Chest: Effort normal and breath sounds normal. No respiratory distress. She has no wheezes. She has no rales. She exhibits no tenderness.  Abdominal: Soft. Bowel sounds are normal. She exhibits no distension and no mass. There is no tenderness. There is no rebound and no guarding.  Musculoskeletal: Normal range of motion. She exhibits no edema and  no tenderness.  Lymphadenopathy:    She has no cervical adenopathy.  Neurological: She is alert and oriented to person, place, and time. She has normal reflexes. No cranial nerve deficit. She exhibits normal muscle tone. Coordination normal.  Skin: Skin is warm and dry. No rash noted. No erythema.  Psychiatric: She has a normal mood and affect. Her behavior is normal. Judgment and thought content normal.          Assessment & Plan:  She seems to be doing well. Check some labs today. Get exercise

## 2011-01-24 LAB — VITAMIN D 25 HYDROXY (VIT D DEFICIENCY, FRACTURES): Vit D, 25-Hydroxy: 41 ng/mL (ref 30–89)

## 2011-01-25 ENCOUNTER — Other Ambulatory Visit: Payer: Self-pay | Admitting: Family Medicine

## 2011-01-25 ENCOUNTER — Ambulatory Visit
Admission: RE | Admit: 2011-01-25 | Discharge: 2011-01-25 | Disposition: A | Discharge status: Home / Self Care | Payer: Medicare Other | Source: Ambulatory Visit | Attending: Family Medicine | Admitting: Family Medicine

## 2011-01-25 DIAGNOSIS — M545 Low back pain, unspecified: Secondary | ICD-10-CM

## 2011-01-25 MED ORDER — METHYLPREDNISOLONE ACETATE 40 MG/ML INJ SUSP (RADIOLOG
120.0000 mg | Freq: Once | INTRAMUSCULAR | Status: AC
  Administered 2011-01-25: 120 mg via EPIDURAL

## 2011-01-25 MED ORDER — IOHEXOL 180 MG/ML  SOLN
1.0000 mL | Freq: Once | INTRAMUSCULAR | Status: AC | PRN
  Administered 2011-01-25: 1 mL via EPIDURAL

## 2011-01-31 NOTE — Progress Notes (Signed)
Left voice message with normal results. 

## 2011-03-16 LAB — COMPREHENSIVE METABOLIC PANEL
ALT: 39 — ABNORMAL HIGH
AST: 32
Albumin: 4.2
Alkaline Phosphatase: 71
BUN: 7
CO2: 29
Calcium: 9.6
Chloride: 100
Creatinine, Ser: 0.6
GFR calc Af Amer: >60
GFR calc non Af Amer: >60
Glucose, Bld: 117 — ABNORMAL HIGH
Potassium: 3.6
Sodium: 138
Total Bilirubin: 0.7
Total Protein: 7.4

## 2011-03-16 LAB — URINALYSIS, ROUTINE W REFLEX MICROSCOPIC
Bilirubin Urine: NEGATIVE
Glucose, UA: NEGATIVE
Hgb urine dipstick: NEGATIVE
Ketones, ur: NEGATIVE
Nitrite: NEGATIVE
Protein, ur: NEGATIVE
Specific Gravity, Urine: 1.013
Urobilinogen, UA: 1
pH: 7.5

## 2011-03-16 LAB — CBC
HCT: 36.5
HCT: 37.8
Hemoglobin: 12.3
Hemoglobin: 12.7
MCHC: 33.6
MCHC: 33.8
MCV: 91.5
MCV: 92.1
Platelets: 148 — ABNORMAL LOW
Platelets: 195
RBC: 4
RBC: 4.1
RDW: 13.5
RDW: 13.9
WBC: 5
WBC: 8.7

## 2011-03-16 LAB — CULTURE, BLOOD (ROUTINE X 2)
Culture: NO GROWTH
Culture: NO GROWTH

## 2011-03-16 LAB — DIFFERENTIAL
Basophils Absolute: 0
Basophils Absolute: 0.1
Basophils Relative: 1
Basophils Relative: 1
Eosinophils Absolute: 0.1
Eosinophils Absolute: 0.2
Eosinophils Relative: 2
Eosinophils Relative: 3
Lymphocytes Relative: 22
Lymphocytes Relative: 23
Lymphs Abs: 1.1
Lymphs Abs: 2
Monocytes Absolute: 0.3
Monocytes Absolute: 0.4
Monocytes Relative: 5
Monocytes Relative: 7
Neutro Abs: 3.5
Neutro Abs: 6
Neutrophils Relative %: 69
Neutrophils Relative %: 69

## 2011-03-16 LAB — POCT I-STAT, CHEM 8
BUN: 3 — ABNORMAL LOW
Calcium, Ion: 1.17
Chloride: 104
Creatinine, Ser: 0.8
Glucose, Bld: 91
HCT: 37
Hemoglobin: 12.6
Potassium: 3.9
Sodium: 139
TCO2: 29

## 2011-03-16 LAB — TSH: TSH: 1.035

## 2011-03-16 LAB — LITHIUM LEVEL: Lithium Lvl: 0.42 — ABNORMAL LOW

## 2011-04-19 ENCOUNTER — Telehealth: Payer: Self-pay | Admitting: Family Medicine

## 2011-04-19 NOTE — Telephone Encounter (Signed)
Dr. Clent Ridges [facility] needs a Dx code for this pt's Vitamin D & 25 hydroxy tests on 01/23/2011. The code given was 780.79, but this will not work for billing. What other code can I submit? Thanks!

## 2011-04-19 NOTE — Telephone Encounter (Signed)
Try using 268, thanks

## 2011-04-23 HISTORY — PX: ESOPHAGOGASTRODUODENOSCOPY: SHX1529

## 2011-04-26 ENCOUNTER — Ambulatory Visit (INDEPENDENT_AMBULATORY_CARE_PROVIDER_SITE_OTHER): Payer: Medicare Other | Admitting: Family Medicine

## 2011-04-26 DIAGNOSIS — Z23 Encounter for immunization: Secondary | ICD-10-CM

## 2011-06-19 HISTORY — PX: REDUCTION MAMMAPLASTY: SUR839

## 2011-06-22 ENCOUNTER — Encounter: Payer: Self-pay | Admitting: Family Medicine

## 2011-06-22 ENCOUNTER — Ambulatory Visit (INDEPENDENT_AMBULATORY_CARE_PROVIDER_SITE_OTHER): Payer: Medicare Other | Admitting: Family Medicine

## 2011-06-22 VITALS — BP 126/84 | HR 99 | Temp 98.6°F | Wt 169.0 lb

## 2011-06-22 DIAGNOSIS — M545 Low back pain, unspecified: Secondary | ICD-10-CM

## 2011-06-22 DIAGNOSIS — R32 Unspecified urinary incontinence: Secondary | ICD-10-CM

## 2011-06-22 DIAGNOSIS — N3281 Overactive bladder: Secondary | ICD-10-CM

## 2011-06-22 DIAGNOSIS — G4700 Insomnia, unspecified: Secondary | ICD-10-CM

## 2011-06-22 DIAGNOSIS — N318 Other neuromuscular dysfunction of bladder: Secondary | ICD-10-CM

## 2011-06-22 LAB — POCT URINALYSIS DIPSTICK
Bilirubin, UA: NEGATIVE
Blood, UA: NEGATIVE
Glucose, UA: NEGATIVE
Ketones, UA: NEGATIVE
Leukocytes, UA: NEGATIVE
Nitrite, UA: NEGATIVE
Protein, UA: NEGATIVE
Spec Grav, UA: 1.015
Urobilinogen, UA: 0.2
pH, UA: 5.5

## 2011-06-22 MED ORDER — MELOXICAM 15 MG PO TABS: 15.0000 mg | ORAL_TABLET | Freq: Every day | ORAL | Status: AC

## 2011-06-22 MED ORDER — OXYBUTYNIN CHLORIDE ER 10 MG PO TB24: 10.0000 mg | ORAL_TABLET | Freq: Every day | ORAL | Status: DC

## 2011-06-22 MED ORDER — TEMAZEPAM 30 MG PO CAPS: 30.0000 mg | ORAL_CAPSULE | Freq: Every evening | ORAL | Status: AC | PRN

## 2011-06-22 NOTE — Progress Notes (Signed)
  Subjective:    Patient ID: [name] [name], female    DOB: 07-24-1955, 56 y.o.   MRN: [id number]  HPI Here for several issues. Her back pain has been worse lately, and even Lortab does not control it. Also she has had urgency to urinate and some incontinence. No fever. Lastly she had tried some temazepam for sleep and it worked well. She would like a supply of her own.    Review of Systems  Constitutional: Negative.   Gastrointestinal: Negative.   Genitourinary: Positive for urgency and frequency.  Musculoskeletal: Positive for back pain.       Objective:   Physical Exam  Constitutional: She appears well-developed and well-nourished.  Cardiovascular: Normal rate, regular rhythm, normal heart sounds and intact distal pulses.   Pulmonary/Chest: Effort normal and breath sounds normal.  Psychiatric: She has a normal mood and affect. Her behavior is normal. Thought content normal.          Assessment & Plan:  Try Temazepam for sleep. Add Meloxicam daily for back pain. She seems to have an overactive bladder, so we will try Oxybutinin for this. Culture the urine to rule out infection.

## 2011-06-25 LAB — URINE CULTURE: Colony Count: >=100000

## 2011-06-28 MED ORDER — CIPROFLOXACIN HCL 500 MG PO TABS: 500.0000 mg | ORAL_TABLET | Freq: Two times a day (BID) | ORAL | Status: AC

## 2011-06-28 NOTE — Progress Notes (Signed)
Addended by: Azucena Freed on: 06/28/2011 10:32 AM   Modules accepted: Orders

## 2011-07-16 ENCOUNTER — Other Ambulatory Visit: Payer: Self-pay | Admitting: Family Medicine

## 2011-07-16 MED ORDER — OMEPRAZOLE 20 MG PO CPDR: 20.0000 mg | DELAYED_RELEASE_CAPSULE | Freq: Two times a day (BID) | ORAL | Status: DC

## 2011-07-16 NOTE — Telephone Encounter (Signed)
Pt need refill on alprazolam and hydrocodone call into [facility] [phone]

## 2011-07-16 NOTE — Telephone Encounter (Signed)
Call in #90 with 5 rf of both

## 2011-07-17 MED ORDER — ALPRAZOLAM 2 MG PO TABS: 2.0000 mg | ORAL_TABLET | Freq: Three times a day (TID) | ORAL | Status: DC | PRN

## 2011-07-17 MED ORDER — HYDROCODONE-ACETAMINOPHEN 10-660 MG PO TABS: 1.0000 | ORAL_TABLET | Freq: Three times a day (TID) | ORAL | Status: DC | PRN

## 2011-07-17 NOTE — Telephone Encounter (Signed)
Scripts called in

## 2011-12-17 ENCOUNTER — Telehealth: Payer: Self-pay | Admitting: Family Medicine

## 2011-12-17 MED ORDER — SIMVASTATIN 40 MG PO TABS: 40.0000 mg | ORAL_TABLET | Freq: Every day | ORAL | Status: DC

## 2011-12-17 NOTE — Telephone Encounter (Signed)
I sent script e-scribe. 

## 2012-01-02 ENCOUNTER — Ambulatory Visit: Payer: Medicare Other | Admitting: Family Medicine

## 2012-01-18 ENCOUNTER — Encounter: Payer: Self-pay | Admitting: Family Medicine

## 2012-01-18 ENCOUNTER — Ambulatory Visit (INDEPENDENT_AMBULATORY_CARE_PROVIDER_SITE_OTHER): Payer: Medicare Other | Admitting: Family Medicine

## 2012-01-18 VITALS — BP 128/86 | HR 94 | Temp 98.7°F | Wt 155.0 lb

## 2012-01-18 DIAGNOSIS — M545 Low back pain, unspecified: Secondary | ICD-10-CM

## 2012-01-18 DIAGNOSIS — F319 Bipolar disorder, unspecified: Secondary | ICD-10-CM

## 2012-01-18 DIAGNOSIS — I10 Essential (primary) hypertension: Secondary | ICD-10-CM

## 2012-01-18 MED ORDER — LAMOTRIGINE 100 MG PO TABS: 100.0000 mg | ORAL_TABLET | Freq: Every day | ORAL | Status: DC

## 2012-01-18 MED ORDER — ALPRAZOLAM 2 MG PO TABS: 2.0000 mg | ORAL_TABLET | Freq: Three times a day (TID) | ORAL | Status: DC | PRN

## 2012-01-18 MED ORDER — HYDROCODONE-ACETAMINOPHEN 10-660 MG PO TABS: 1.0000 | ORAL_TABLET | Freq: Three times a day (TID) | ORAL | Status: DC | PRN

## 2012-01-18 MED ORDER — LAMOTRIGINE 100 MG PO TABS: 100.0000 mg | ORAL_TABLET | Freq: Two times a day (BID) | ORAL | Status: DC

## 2012-01-18 NOTE — Progress Notes (Signed)
  Subjective:    Patient ID: [name] [name], female    DOB: 1955/12/24, 56 y.o.   MRN: [id number]  HPI Here to follow up on chronic issues. She had been doing well on her psychiatric meds until she stopped taking everything except the Lamictal about 6 months ago. She still goes to the [facility] for therapy. For the past few months she has felt the old depression coming back, and now she knows she needs toget back on meds. She feels sad, tearful, and unmotivated most days. No suicidal thoughts. She thinks she needs to be on Seroquel again. Her back pain is stable.    Review of Systems  Constitutional: Negative.   Respiratory: Negative.   Cardiovascular: Negative.   Neurological: Negative.   Psychiatric/Behavioral: Positive for dysphoric mood and decreased concentration. Negative for behavioral problems, confusion and agitation. The patient is nervous/anxious.        Objective:   Physical Exam  Constitutional: She is oriented to person, place, and time. She appears well-developed and well-nourished.  Cardiovascular: Normal rate, regular rhythm, normal heart sounds and intact distal pulses.   Pulmonary/Chest: Effort normal and breath sounds normal.  Neurological: She is alert and oriented to person, place, and time.  Psychiatric: Her behavior is normal. Thought content normal.       Affect is depressed          Assessment & Plan:  Her HTN is stable, as is the back pain. Her bipolar disorder has decompensated again, and I agree she should be on medications. She will talk to the [facility] next week

## 2012-01-28 ENCOUNTER — Other Ambulatory Visit: Payer: Self-pay

## 2012-01-28 ENCOUNTER — Other Ambulatory Visit: Payer: Self-pay | Admitting: Nurse Practitioner

## 2012-01-28 DIAGNOSIS — Z1231 Encounter for screening mammogram for malignant neoplasm of breast: Secondary | ICD-10-CM

## 2012-01-28 MED ORDER — OMEPRAZOLE 20 MG PO CPDR: 20.0000 mg | DELAYED_RELEASE_CAPSULE | Freq: Two times a day (BID) | ORAL | Status: DC

## 2012-02-12 ENCOUNTER — Ambulatory Visit (HOSPITAL_COMMUNITY)
Admission: RE | Admit: 2012-02-12 | Discharge: 2012-02-12 | Disposition: A | Discharge status: Home / Self Care | Payer: Medicare Other | Source: Ambulatory Visit | Attending: Nurse Practitioner | Admitting: Nurse Practitioner

## 2012-02-12 DIAGNOSIS — Z1231 Encounter for screening mammogram for malignant neoplasm of breast: Secondary | ICD-10-CM

## 2012-02-25 ENCOUNTER — Telehealth: Payer: Self-pay | Admitting: Family Medicine

## 2012-02-25 NOTE — Telephone Encounter (Signed)
Refill request for Acyclovir 400 mg take 1 po qid and last here on 01/18/12.

## 2012-02-26 NOTE — Telephone Encounter (Signed)
Call this in to take prn, #60 with 5 rf

## 2012-02-27 MED ORDER — ACYCLOVIR 400 MG PO TABS: 400.0000 mg | ORAL_TABLET | Freq: Four times a day (QID) | ORAL | Status: AC | PRN

## 2012-02-27 NOTE — Telephone Encounter (Signed)
I sent script e-scribe. 

## 2012-03-05 DIAGNOSIS — Z0279 Encounter for issue of other medical certificate: Secondary | ICD-10-CM

## 2012-04-02 ENCOUNTER — Ambulatory Visit: Payer: Medicare Other

## 2012-04-07 ENCOUNTER — Ambulatory Visit: Payer: Medicare Other | Admitting: Family Medicine

## 2012-07-11 ENCOUNTER — Telehealth: Payer: Self-pay | Admitting: Family Medicine

## 2012-07-11 NOTE — Telephone Encounter (Signed)
Refill request for Alprazolam & Hydrocodone/APAP and last here on 01/18/12.

## 2012-07-11 NOTE — Telephone Encounter (Signed)
Call in Alprazolam #90 with 5 rf. Change the Vicodin to 10/325 and call in #90 with 5 rf

## 2012-07-11 NOTE — Telephone Encounter (Signed)
Refill request for meloxicam 15 mg take 1 po qd.

## 2012-07-15 MED ORDER — HYDROCODONE-ACETAMINOPHEN 10-325 MG PO TABS: 1.0000 | ORAL_TABLET | Freq: Three times a day (TID) | ORAL | Status: DC | PRN

## 2012-07-15 MED ORDER — MELOXICAM 15 MG PO TABS: 15.0000 mg | ORAL_TABLET | Freq: Every day | ORAL | Status: DC

## 2012-07-15 MED ORDER — ALPRAZOLAM 2 MG PO TABS: 2.0000 mg | ORAL_TABLET | Freq: Three times a day (TID) | ORAL | Status: DC | PRN

## 2012-07-15 NOTE — Telephone Encounter (Signed)
I sent in all scripts

## 2012-08-27 ENCOUNTER — Other Ambulatory Visit: Payer: Medicare Other

## 2012-09-03 ENCOUNTER — Encounter: Payer: Medicare Other | Admitting: Family Medicine

## 2012-09-03 ENCOUNTER — Other Ambulatory Visit (INDEPENDENT_AMBULATORY_CARE_PROVIDER_SITE_OTHER): Payer: Medicare Other

## 2012-09-03 DIAGNOSIS — Z0000 Encounter for general adult medical examination without abnormal findings: Secondary | ICD-10-CM

## 2012-09-03 LAB — HEPATIC FUNCTION PANEL
ALT: 19 U/L (ref 0–35)
AST: 23 U/L (ref 0–37)
Albumin: 4.2 g/dL (ref 3.5–5.2)
Alkaline Phosphatase: 72 U/L (ref 39–117)
Bilirubin, Direct: 0.1 mg/dL (ref 0.0–0.3)
Total Bilirubin: 0.4 mg/dL (ref 0.3–1.2)
Total Protein: 7.9 g/dL (ref 6.0–8.3)

## 2012-09-03 LAB — BASIC METABOLIC PANEL
BUN: 17 mg/dL (ref 6–23)
CO2: 30 mEq/L (ref 19–32)
Calcium: 9.6 mg/dL (ref 8.4–10.5)
Chloride: 103 mEq/L (ref 96–112)
Creatinine, Ser: 0.6 mg/dL (ref 0.4–1.2)
GFR: 101.58 mL/min (ref >60.00)
Glucose, Bld: 85 mg/dL (ref 70–99)
Potassium: 3.8 mEq/L (ref 3.5–5.1)
Sodium: 141 mEq/L (ref 135–145)

## 2012-09-03 LAB — POCT URINALYSIS DIPSTICK
Bilirubin, UA: NEGATIVE
Blood, UA: NEGATIVE
Glucose, UA: NEGATIVE
Ketones, UA: NEGATIVE
Leukocytes, UA: NEGATIVE
Nitrite, UA: NEGATIVE
Protein, UA: NEGATIVE
Spec Grav, UA: >=1.03
Urobilinogen, UA: 0.2
pH, UA: 5.5

## 2012-09-03 LAB — CBC WITH DIFFERENTIAL/PLATELET
Basophils Absolute: 0 10*3/uL (ref 0.0–0.1)
Basophils Relative: 0.6 % (ref 0.0–3.0)
Eosinophils Absolute: 0.2 10*3/uL (ref 0.0–0.7)
Eosinophils Relative: 4.4 % (ref 0.0–5.0)
HCT: 36.8 % (ref 36.0–46.0)
Hemoglobin: 12.3 g/dL (ref 12.0–15.0)
Lymphocytes Relative: 33.3 % (ref 12.0–46.0)
Lymphs Abs: 1.9 10*3/uL (ref 0.7–4.0)
MCHC: 33.4 g/dL (ref 30.0–36.0)
MCV: 91.1 fl (ref 78.0–100.0)
Monocytes Absolute: 0.3 10*3/uL (ref 0.1–1.0)
Monocytes Relative: 6.1 % (ref 3.0–12.0)
Neutro Abs: 3.1 10*3/uL (ref 1.4–7.7)
Neutrophils Relative %: 55.6 % (ref 43.0–77.0)
Platelets: 209 10*3/uL (ref 150.0–400.0)
RBC: 4.04 Mil/uL (ref 3.87–5.11)
RDW: 13.8 % (ref 11.5–14.6)
WBC: 5.6 10*3/uL (ref 4.5–10.5)

## 2012-09-03 LAB — LIPID PANEL
Cholesterol: 175 mg/dL (ref 0–200)
HDL: 43.1 mg/dL (ref >39.00)
LDL Cholesterol: 104 mg/dL — ABNORMAL HIGH (ref 0–99)
Total CHOL/HDL Ratio: 4
Triglycerides: 138 mg/dL (ref 0.0–149.0)
VLDL: 27.6 mg/dL (ref 0.0–40.0)

## 2012-09-03 LAB — TSH: TSH: 2.69 u[IU]/mL (ref 0.35–5.50)

## 2012-09-04 NOTE — Progress Notes (Signed)
Quick Note:  Pt has appointment on 09/09/12 will go over then. ______ 

## 2012-09-09 ENCOUNTER — Ambulatory Visit (INDEPENDENT_AMBULATORY_CARE_PROVIDER_SITE_OTHER): Payer: Medicare Other | Admitting: Family Medicine

## 2012-09-09 ENCOUNTER — Encounter: Payer: Self-pay | Admitting: Family Medicine

## 2012-09-09 VITALS — BP 118/78 | HR 97 | Temp 99.3°F | Ht 62.75 in | Wt 163.0 lb

## 2012-09-09 DIAGNOSIS — Z0000 Encounter for general adult medical examination without abnormal findings: Secondary | ICD-10-CM

## 2012-09-09 DIAGNOSIS — N62 Hypertrophy of breast: Secondary | ICD-10-CM

## 2012-09-09 MED ORDER — HYDROCODONE-ACETAMINOPHEN 10-325 MG PO TABS: 1.0000 | ORAL_TABLET | Freq: Four times a day (QID) | ORAL | Status: DC | PRN

## 2012-09-09 NOTE — Progress Notes (Signed)
  Subjective:    Patient ID: [name] [name], female    DOB: [birth year], 57 y.o.   MRN: [id number]  HPI 57 yr old female for a cpx. She is doing well in general but complains of constant upper back pain. She has large breasts and is interested in reduction surgery.    Review of Systems  Constitutional: Negative.   HENT: Negative.   Eyes: Negative.   Respiratory: Negative.   Cardiovascular: Negative.   Gastrointestinal: Negative.   Genitourinary: Negative for dysuria, urgency, frequency, hematuria, flank pain, decreased urine volume, enuresis, difficulty urinating, pelvic pain and dyspareunia.  Musculoskeletal: Positive for back pain.  Skin: Negative.   Neurological: Negative.   Psychiatric/Behavioral: Negative.        Objective:   Physical Exam  Constitutional: She is oriented to person, place, and time. She appears well-developed and well-nourished. No distress.  HENT:  Head: Normocephalic and atraumatic.  Right Ear: External ear normal.  Left Ear: External ear normal.  Nose: Nose normal.  Mouth/Throat: Oropharynx is clear and moist. No oropharyngeal exudate.  Eyes: Conjunctivae and EOM are normal. Pupils are equal, round, and reactive to light. No scleral icterus.  Neck: Normal range of motion. Neck supple. No JVD present. No thyromegaly present.  Cardiovascular: Normal rate, regular rhythm, normal heart sounds and intact distal pulses.  Exam reveals no gallop and no friction rub.   No murmur heard. EKG normal   Pulmonary/Chest: Effort normal and breath sounds normal. No respiratory distress. She has no wheezes. She has no rales. She exhibits no tenderness.  Abdominal: Soft. Bowel sounds are normal. She exhibits no distension and no mass. There is no tenderness. There is no rebound and no guarding.  Musculoskeletal: Normal range of motion. She exhibits no edema and no tenderness.  Lymphadenopathy:    She has no cervical adenopathy.  Neurological: She is alert and oriented  to person, place, and time. She has normal reflexes. No cranial nerve deficit. She exhibits normal muscle tone. Coordination normal.  Skin: Skin is warm and dry. No rash noted. No erythema.  Psychiatric: She has a normal mood and affect. Her behavior is normal. Judgment and thought content normal.          Assessment & Plan:  Well exam. We will refer to Surgery for possible breast reduction surgery.

## 2012-09-10 ENCOUNTER — Telehealth: Payer: Self-pay | Admitting: Family Medicine

## 2012-09-10 ENCOUNTER — Other Ambulatory Visit: Payer: Self-pay | Admitting: *Deleted

## 2012-09-10 MED ORDER — ERGOCALCIFEROL 1.25 MG (50000 UT) PO CAPS: 50000.0000 [IU] | ORAL_CAPSULE | ORAL | Status: DC

## 2012-09-10 MED ORDER — OMEPRAZOLE 20 MG PO CPDR: 20.0000 mg | DELAYED_RELEASE_CAPSULE | Freq: Two times a day (BID) | ORAL | Status: DC

## 2012-09-10 NOTE — Telephone Encounter (Signed)
Refill request for Omeprazole and I did send e-scribe

## 2012-12-11 HISTORY — PX: BREAST SURGERY: SHX581

## 2013-01-13 ENCOUNTER — Ambulatory Visit (INDEPENDENT_AMBULATORY_CARE_PROVIDER_SITE_OTHER): Payer: Medicare Other | Admitting: Family Medicine

## 2013-01-13 ENCOUNTER — Encounter: Payer: Self-pay | Admitting: Family Medicine

## 2013-01-13 VITALS — BP 120/80 | HR 106 | Temp 98.6°F | Wt 160.0 lb

## 2013-01-13 DIAGNOSIS — M545 Low back pain, unspecified: Secondary | ICD-10-CM

## 2013-01-13 DIAGNOSIS — G4700 Insomnia, unspecified: Secondary | ICD-10-CM

## 2013-01-13 DIAGNOSIS — F3289 Other specified depressive episodes: Secondary | ICD-10-CM

## 2013-01-13 DIAGNOSIS — I10 Essential (primary) hypertension: Secondary | ICD-10-CM

## 2013-01-13 DIAGNOSIS — F329 Major depressive disorder, single episode, unspecified: Secondary | ICD-10-CM

## 2013-01-13 MED ORDER — ALPRAZOLAM 2 MG PO TABS: 2.0000 mg | ORAL_TABLET | Freq: Three times a day (TID) | ORAL | Status: DC | PRN

## 2013-01-13 MED ORDER — LAMOTRIGINE 100 MG PO TABS: 100.0000 mg | ORAL_TABLET | Freq: Two times a day (BID) | ORAL | Status: DC

## 2013-01-13 NOTE — Progress Notes (Signed)
  Subjective:    Patient ID: [name] [name], female    DOB: 30-Sep-1955, 57 y.o.   MRN: [id number]  HPI Here for refills. She has been doing very well, and her moods are stable. She had breast reduction surgery and is very pleased with the results. She has much less neck and upper back pain.   Review of Systems  Constitutional: Negative.   Respiratory: Negative.   Cardiovascular: Negative.   Neurological: Negative.   Psychiatric/Behavioral: Negative.        Objective:   Physical Exam  Constitutional: She is oriented to person, place, and time. She appears well-developed and well-nourished.  Cardiovascular: Normal rate, regular rhythm, normal heart sounds and intact distal pulses.   Pulmonary/Chest: Effort normal and breath sounds normal.  Neurological: She is alert and oriented to person, place, and time.  Psychiatric: She has a normal mood and affect. Her behavior is normal. Thought content normal.          Assessment & Plan:  She is doing very well. Refilled meds

## 2013-01-15 ENCOUNTER — Other Ambulatory Visit: Payer: Self-pay | Admitting: Family Medicine

## 2013-01-20 ENCOUNTER — Other Ambulatory Visit (HOSPITAL_COMMUNITY): Payer: Self-pay | Admitting: *Deleted

## 2013-02-09 ENCOUNTER — Other Ambulatory Visit: Payer: Self-pay | Admitting: Family Medicine

## 2013-02-13 ENCOUNTER — Telehealth: Payer: Self-pay | Admitting: Family Medicine

## 2013-02-13 NOTE — Telephone Encounter (Signed)
Refill request for Seroquel XR 50 mg take 1 po qhs and send to [facility].

## 2013-02-13 NOTE — Telephone Encounter (Signed)
Okay to call this in, 90 days supply with one rf

## 2013-02-17 MED ORDER — QUETIAPINE FUMARATE 50 MG PO TABS: 50.0000 mg | ORAL_TABLET | Freq: Every day | ORAL | Status: DC

## 2013-02-17 NOTE — Telephone Encounter (Signed)
I did call in script, however it does not come in the XR for this dose, okay per Dr. Clent Ridges.

## 2013-03-06 ENCOUNTER — Telehealth: Payer: Self-pay | Admitting: Family Medicine

## 2013-03-06 NOTE — Telephone Encounter (Signed)
[facility] Triage Call Report Triage Record Num: [id number] Operator: Tomasita Crumble Patient Name: [name] [name] Call Date & Time: 03/05/2013 5:34:12PM Patient Phone: [phone] PCP: Patient Gender: Female PCP Fax : Patient DOB: May 08, 1956 Practice Name: [facility] Reason for Call: Caller: [name] [name]/Patient; PCP: Gershon Crane [facility]); CB#: [phone]; Call regarding Back Pain; States she has ordered [employer] Back Brace from [facility]. She states she was advised to notify MD that this has been done. She also reports that breast reduction done July 2014 has reduced her upper back pain and she wanted Dr. Clent Ridges to know that. Information noted and sent to office. Advised to follow up with provider during regular hours per PCP Calls, No Triage protocol. Protocol(s) Used: PCP Calls, No Triage (Adult) Recommended Outcome per Protocol: Call Provider within 24 Hours Reason for Outcome: [1] Caller requests to speak ONLY to PCP AND [2] nonurgent question Care Advice: ~ 09/

## 2013-03-19 ENCOUNTER — Telehealth: Payer: Self-pay | Admitting: Family Medicine

## 2013-03-19 MED ORDER — HYDROCODONE-ACETAMINOPHEN 10-325 MG PO TABS: 1.0000 | ORAL_TABLET | Freq: Four times a day (QID) | ORAL | Status: DC | PRN

## 2013-03-19 NOTE — Telephone Encounter (Signed)
done

## 2013-03-19 NOTE — Telephone Encounter (Signed)
Refill request for Norco 10/325 mg and send to [facility].

## 2013-03-20 ENCOUNTER — Ambulatory Visit (INDEPENDENT_AMBULATORY_CARE_PROVIDER_SITE_OTHER): Payer: Medicare Other

## 2013-03-20 DIAGNOSIS — Z23 Encounter for immunization: Secondary | ICD-10-CM

## 2013-03-20 NOTE — Telephone Encounter (Signed)
Called and spoke with pt and pt is aware.  

## 2013-03-23 ENCOUNTER — Ambulatory Visit: Payer: Self-pay | Admitting: Nurse Practitioner

## 2013-04-14 ENCOUNTER — Other Ambulatory Visit: Payer: Self-pay | Admitting: Neurosurgery

## 2013-04-14 DIAGNOSIS — R270 Ataxia, unspecified: Secondary | ICD-10-CM

## 2013-04-14 DIAGNOSIS — M5412 Radiculopathy, cervical region: Secondary | ICD-10-CM

## 2013-04-17 ENCOUNTER — Other Ambulatory Visit: Payer: Medicare Other

## 2013-04-19 ENCOUNTER — Ambulatory Visit
Admission: RE | Admit: 2013-04-19 | Discharge: 2013-04-19 | Disposition: A | Discharge status: Home / Self Care | Payer: Medicare Other | Source: Ambulatory Visit | Attending: Neurosurgery | Admitting: Neurosurgery

## 2013-04-19 DIAGNOSIS — R270 Ataxia, unspecified: Secondary | ICD-10-CM

## 2013-04-19 DIAGNOSIS — M5412 Radiculopathy, cervical region: Secondary | ICD-10-CM

## 2013-04-20 ENCOUNTER — Encounter: Payer: Self-pay | Admitting: Nurse Practitioner

## 2013-04-20 ENCOUNTER — Telehealth: Payer: Self-pay | Admitting: Family Medicine

## 2013-04-20 ENCOUNTER — Ambulatory Visit (INDEPENDENT_AMBULATORY_CARE_PROVIDER_SITE_OTHER): Payer: Medicare Other | Admitting: Nurse Practitioner

## 2013-04-20 VITALS — BP 118/74 | HR 76 | Resp 16 | Ht 62.25 in | Wt 168.0 lb

## 2013-04-20 DIAGNOSIS — Z23 Encounter for immunization: Secondary | ICD-10-CM

## 2013-04-20 DIAGNOSIS — Z90710 Acquired absence of both cervix and uterus: Secondary | ICD-10-CM

## 2013-04-20 DIAGNOSIS — F3162 Bipolar disorder, current episode mixed, moderate: Secondary | ICD-10-CM

## 2013-04-20 DIAGNOSIS — E559 Vitamin D deficiency, unspecified: Secondary | ICD-10-CM

## 2013-04-20 DIAGNOSIS — Z01419 Encounter for gynecological examination (general) (routine) without abnormal findings: Secondary | ICD-10-CM

## 2013-04-20 MED ORDER — HYDROCODONE-ACETAMINOPHEN 10-325 MG PO TABS: 1.0000 | ORAL_TABLET | Freq: Four times a day (QID) | ORAL | Status: DC | PRN

## 2013-04-20 NOTE — Patient Instructions (Signed)

## 2013-04-20 NOTE — Telephone Encounter (Signed)
done

## 2013-04-20 NOTE — Progress Notes (Signed)
Patient ID: [name] [name], female   DOB: June 14, 1956, 57 y.o.   MRN: [id number] 57 y.o. G50P2002 Married Caucasian Fe here for annual exam. She and ex husband remarried last August.  She felt that she divorced him out of her family not liking him because of being Hispanic.  They get along very well and he has always helped her.   Her mother is now 56 and health is declining. She had breast reduction in June and  went from size 38 3D to 34 D.  She thinks maybe that has helped her back and shoulder pain.  No LMP recorded. Patient has had a hysterectomy.          Sexually active: no  The current method of family planning is abstinence.    Exercising: no  The patient does not participate in regular exercise at present. Smoker:  yes  Health Maintenance: Pap:  02/02/03, WNL MMG:  02/13/12, Bi-Rads 1: negative Colonoscopy:  2011, repeat 5 years BMD:   01/07/09,  TDaP:  unsure Labs: PCP   reports that she has been smoking Cigarettes.  She has been smoking about 0.00 packs per day. She has never used smokeless tobacco. She reports that she does not drink alcohol or use illicit drugs.  Past Medical History  Diagnosis Date  . Hyperlipidemia   . Hypertension   . Allergy   . GERD (gastroesophageal reflux disease)   . Sleep apnea   . PPD positive   . Urinary incontinence   . Memory loss   . Depression     sees the [facility]     Past Surgical History  Procedure Laterality Date  . Abdominal hysterectomy    . Lumbar disc surgery  2003    Dr. Wynetta Emery  . Anterior fusion cervical spine  2006    Dr. Wynetta Emery  . Uvulopalatopharyngoplasty    . Esophagogastroduodenoscopy  04-23-11    per Dr. Loreta Ave, normal   . Colonoscopy  04-23-11    per Dr. Loreta Ave, adenomatous polyps, repeat in 5 yrs   . Breast biopsy    . Breast surgery Bilateral 12-11-12    reductions per Dr. Ivin Booty [facility]     Current Outpatient Prescriptions  Medication Sig Dispense Refill  . acyclovir (ZOVIRAX) 400 MG tablet  TAKE 1 TABLET BY MOUTH FOUR TIMES A DAY AS NEEDED  60 tablet  1  . alprazolam (XANAX) 2 MG tablet Take 1 tablet (2 mg total) by mouth 3 (three) times daily as needed for anxiety.  90 tablet  5  . Coconut Oil 1000 MG CAPS Take 2 capsules by mouth 2 (two) times daily.      . Cyanocobalamin (B-12) 2500 MCG TABS Take 1 tablet by mouth daily.      . ergocalciferol (VITAMIN D2) 50000 UNITS capsule Take 1 capsule (50,000 Units total) by mouth once a week. Once a week  26 capsule  1  . HYDROcodone-acetaminophen (NORCO) 10-325 MG per tablet Take 1 tablet by mouth every 6 (six) hours as needed for pain.  120 tablet  0  . Multiple Vitamin (MULTIVITAMIN) tablet Take 1 tablet by mouth daily.        [name] omeprazole (PRILOSEC) 20 MG capsule Take 20 mg by mouth 2 (two) times daily as needed.      . Probiotic Product (FLORAJEN3 PO) Take 1 tablet by mouth daily.      . simvastatin (ZOCOR) 40 MG tablet TAKE 1 TABLET BY MOUTH EVERY NIGHT AT  BEDTIME  30 tablet  11  . estradiol (ESTRACE VAGINAL) 0.1 MG/GM vaginal cream Place 2 g vaginally 2 (two) times a week.        . lamoTRIgine (LAMICTAL) 100 MG tablet Take 1 tablet (100 mg total) by mouth 2 (two) times daily.  60 tablet  5  . QUEtiapine (SEROQUEL) 50 MG tablet Take 1 tablet (50 mg total) by mouth at bedtime.  90 tablet  1   No current facility-administered medications for this visit.    Family History  Problem Relation Age of Onset  . Dementia Mother   . Alcohol abuse      fhx  . Arthritis      fhx  . Coronary artery disease      fhx  . Depression      fhx  . Diabetes      fhx  . Hyperlipidemia      fhx  . Hypertension      fhx  . Sudden death      fhx    ROS:  Pertinent items are noted in HPI.  Otherwise, a comprehensive ROS was negative.  Exam:   BP 118/74  Pulse 76  Resp 16  Ht 5' 2.25" (1.581 m)  Wt 168 lb (76.204 kg)  BMI 30.49 kg/m2 Height: 5' 2.25" (158.1 cm)  Ht Readings from Last 3 Encounters:  04/20/13 5' 2.25" (1.581 m)   09/09/12 5' 2.75" (1.594 m)  01/23/11 5' 2.5" (1.588 m)    General appearance: alert, cooperative and appears stated age Head: Normocephalic, without obvious abnormality, atraumatic Neck: no adenopathy, supple, symmetrical, trachea midline and thyroid normal to inspection and palpation Lungs: clear to auscultation bilaterally Breasts: positive findings: post susrgical changes bilaterally from reduction. Heart: regular rate and rhythm Abdomen: soft, non-tender; no masses,  no organomegaly Extremities: extremities normal, atraumatic, no cyanosis or edema Skin: Skin color, texture, turgor normal. No rashes or lesions Lymph nodes: Cervical, supraclavicular, and axillary nodes normal. No abnormal inguinal nodes palpated Neurologic: Grossly normal   Pelvic: External genitalia:  no lesions              Urethra:  normal appearing urethra with no masses, tenderness or lesions              Bartholin's and Skene's: normal                 Vagina: normal appearing vagina with normal color and discharge, no lesions              Cervix: absent              Pap taken: no Bimanual Exam:  Uterus:  uterus absent              Adnexa: no mass, fullness, tenderness               Rectovaginal: Confirms               Anus:  normal sphincter tone, no lesions  A:  Well Woman with normal exam  S/P TVH 12/96  S/P Bilateral breast reduction 6/14  History of major depressive disorder with personality disorder  Disability with [facility]  Vit D deficiency  Update immunization  P:   Pap smear as per guidelines not indicated   Mammogram due now and will schedule  TDaP given today  Will check Vit D - does not need a refill on RX of Vit D at this time  Counseled on breast  self exam, adequate intake of calcium and vitamin D, diet and exercise, Kegel's exercises return annually or prn  An After Visit Summary was printed and given to the patient.

## 2013-04-20 NOTE — Telephone Encounter (Signed)
Pt request refill HYDROcodone-acetaminophen (NORCO) 10-325 MG per tablet °

## 2013-04-21 LAB — VITAMIN D 25 HYDROXY (VIT D DEFICIENCY, FRACTURES): Vit D, 25-Hydroxy: 52 ng/mL (ref 30–89)

## 2013-04-21 NOTE — Telephone Encounter (Signed)
Script is ready for pick up and I spoke with pt.  

## 2013-04-22 ENCOUNTER — Telehealth: Payer: Self-pay | Admitting: *Deleted

## 2013-04-22 NOTE — Telephone Encounter (Signed)
I have attempted to contact this patient by phone with the following results: left message to return my call on answering machine (mobile/home). 

## 2013-04-22 NOTE — Telephone Encounter (Signed)
Pt notified in result note.  

## 2013-04-22 NOTE — Telephone Encounter (Signed)
Message copied by Luisa Dago on Wed Apr 22, 2013 10:58 AM ------      Message from: Ria Comment R      Created: Tue Apr 21, 2013  8:58 AM       Let patient know and follow protocol ------

## 2013-04-22 NOTE — Progress Notes (Signed)
Encounter reviewed by Dr. Brook Silva.  

## 2013-05-19 ENCOUNTER — Telehealth: Payer: Self-pay | Admitting: Family Medicine

## 2013-05-19 NOTE — Telephone Encounter (Signed)
Pt needs new rx hydrocodone for pick up this friday

## 2013-05-20 MED ORDER — HYDROCODONE-ACETAMINOPHEN 10-325 MG PO TABS: 1.0000 | ORAL_TABLET | Freq: Four times a day (QID) | ORAL | Status: DC | PRN

## 2013-05-20 NOTE — Telephone Encounter (Signed)
Called and spoke with pt and pt is aware. Contracted printed and attached to envelope.

## 2013-05-20 NOTE — Telephone Encounter (Signed)
Done but she needs testing and a contract  

## 2013-05-29 ENCOUNTER — Ambulatory Visit (HOSPITAL_COMMUNITY): Payer: Medicare Other | Admitting: Psychiatry

## 2013-06-01 ENCOUNTER — Ambulatory Visit (HOSPITAL_COMMUNITY): Payer: Medicare Other | Admitting: Psychiatry

## 2013-06-08 ENCOUNTER — Telehealth: Payer: Self-pay | Admitting: Family Medicine

## 2013-06-08 ENCOUNTER — Other Ambulatory Visit: Payer: Self-pay | Admitting: Family Medicine

## 2013-06-08 NOTE — Telephone Encounter (Signed)
Call in Xanax #90 with 5 rf. She really needs to limit the use of Vicodin to one pill at a time. Instead of aspirin use Aleve once or twice a day, and take with a full meal

## 2013-06-08 NOTE — Telephone Encounter (Signed)
Pt is requesting a refill of her alprazolam (XANAX) 2 MG tablet. Pt also states that since Dr. Clent Ridges has decreased the dosage of her Hydrocodone (1 every 6 hours), it is not working.  Pt has been taking Goody powder to help with pain and has since caused her an upset stomach.  Pt wants to know can she take 2 Hydrocodones every 6 hours. Please call pt.

## 2013-06-09 MED ORDER — ALPRAZOLAM 2 MG PO TABS: 2.0000 mg | ORAL_TABLET | Freq: Three times a day (TID) | ORAL | Status: DC | PRN

## 2013-06-09 NOTE — Telephone Encounter (Signed)
I called in script and spoke with pt. 

## 2013-06-10 ENCOUNTER — Ambulatory Visit: Payer: Medicare Other | Attending: Neurosurgery | Admitting: Physical Therapy

## 2013-06-10 DIAGNOSIS — G510 Bell's palsy: Secondary | ICD-10-CM | POA: Insufficient documentation

## 2013-06-10 DIAGNOSIS — R262 Difficulty in walking, not elsewhere classified: Secondary | ICD-10-CM | POA: Insufficient documentation

## 2013-06-10 DIAGNOSIS — M545 Low back pain, unspecified: Secondary | ICD-10-CM | POA: Insufficient documentation

## 2013-06-10 DIAGNOSIS — I10 Essential (primary) hypertension: Secondary | ICD-10-CM | POA: Insufficient documentation

## 2013-06-10 DIAGNOSIS — IMO0001 Reserved for inherently not codable concepts without codable children: Secondary | ICD-10-CM | POA: Insufficient documentation

## 2013-06-10 DIAGNOSIS — F411 Generalized anxiety disorder: Secondary | ICD-10-CM | POA: Insufficient documentation

## 2013-06-10 DIAGNOSIS — Z981 Arthrodesis status: Secondary | ICD-10-CM | POA: Insufficient documentation

## 2013-06-10 DIAGNOSIS — R42 Dizziness and giddiness: Secondary | ICD-10-CM | POA: Insufficient documentation

## 2013-06-10 DIAGNOSIS — F29 Unspecified psychosis not due to a substance or known physiological condition: Secondary | ICD-10-CM | POA: Insufficient documentation

## 2013-06-10 DIAGNOSIS — F319 Bipolar disorder, unspecified: Secondary | ICD-10-CM | POA: Insufficient documentation

## 2013-06-19 ENCOUNTER — Telehealth: Payer: Self-pay | Admitting: Family Medicine

## 2013-06-19 MED ORDER — HYDROCODONE-ACETAMINOPHEN 10-325 MG PO TABS: 1.0000 | ORAL_TABLET | Freq: Four times a day (QID) | ORAL | Status: DC | PRN

## 2013-06-19 NOTE — Telephone Encounter (Signed)
Pt aware Rx ready for pick up 

## 2013-06-19 NOTE — Telephone Encounter (Signed)
Last OV 7.29.2014. Last refill 12.3.2014

## 2013-06-19 NOTE — Telephone Encounter (Signed)
done

## 2013-06-19 NOTE — Telephone Encounter (Signed)
Pt called to request a refill of her  HYDROcodone-acetaminophen (NORCO) 10-325 MG per tablet. Please assist.

## 2013-06-22 ENCOUNTER — Telehealth: Payer: Self-pay | Admitting: Nurse Practitioner

## 2013-06-22 NOTE — Telephone Encounter (Signed)
Patient wants to know if there was a cheaper alternative for estrace because it cost her $120 for one tube if not Patient needs a refill of  estradiol (ESTRACE VAGINAL) 0.1 MG/GM vaginal cream  Place 2 g vaginally 2 (two) times a week. , Last Dose: Not Recorded  [facility] [phone]

## 2013-06-22 NOTE — Telephone Encounter (Signed)
Is there a cheaper alternative for this med? If not, then refill.

## 2013-06-23 ENCOUNTER — Other Ambulatory Visit: Payer: Self-pay | Admitting: Orthopedic Surgery

## 2013-06-23 ENCOUNTER — Ambulatory Visit: Payer: Medicare Other | Attending: Neurosurgery | Admitting: Physical Therapy

## 2013-06-23 ENCOUNTER — Other Ambulatory Visit: Payer: Self-pay | Admitting: Nurse Practitioner

## 2013-06-23 DIAGNOSIS — IMO0001 Reserved for inherently not codable concepts without codable children: Secondary | ICD-10-CM | POA: Insufficient documentation

## 2013-06-23 DIAGNOSIS — Z981 Arthrodesis status: Secondary | ICD-10-CM | POA: Insufficient documentation

## 2013-06-23 DIAGNOSIS — R262 Difficulty in walking, not elsewhere classified: Secondary | ICD-10-CM | POA: Insufficient documentation

## 2013-06-23 DIAGNOSIS — R42 Dizziness and giddiness: Secondary | ICD-10-CM | POA: Insufficient documentation

## 2013-06-23 MED ORDER — ESTROGENS, CONJUGATED 0.625 MG/GM VA CREA: 1.0000 | TOPICAL_CREAM | Freq: Every day | VAGINAL | Status: DC

## 2013-06-23 NOTE — Telephone Encounter (Signed)
I will put in order for Premarin vag cream - maybe that is a little cheaper.

## 2013-06-23 NOTE — Telephone Encounter (Signed)
[name] CornfieldStephanie from [facility] needs to clarify dosage for premarin cream. The concentration per gram is not the same as for estradiol cream. She had a question about daily use versus twice weekly use.

## 2013-06-23 NOTE — Telephone Encounter (Signed)
The usual dose is 0.5 - 1 gm twice weekly. This new rx has the correct dosing instructions for this patient.

## 2013-06-24 NOTE — Telephone Encounter (Signed)
Call to [facility] at [facility]. Clarified new RX for Premarin vaginal Cream for 1/2 to 1 gram per vagina twice weekly.  Per Judeth CornfieldStephanie, this will only be $30 for patient.    Call to patient and notified of new RX and reviewed dose instructions, reviewed applicator and that it should be a small amount and one tube should last 3 months. Call if any questions.

## 2013-06-25 ENCOUNTER — Ambulatory Visit (INDEPENDENT_AMBULATORY_CARE_PROVIDER_SITE_OTHER): Payer: No Typology Code available for payment source | Admitting: Psychiatry

## 2013-06-25 ENCOUNTER — Encounter (HOSPITAL_COMMUNITY): Payer: Self-pay | Admitting: Psychiatry

## 2013-06-25 ENCOUNTER — Encounter (INDEPENDENT_AMBULATORY_CARE_PROVIDER_SITE_OTHER): Payer: Self-pay

## 2013-06-25 VITALS — BP 138/76 | HR 80 | Ht 63.0 in | Wt 168.2 lb

## 2013-06-25 DIAGNOSIS — F319 Bipolar disorder, unspecified: Secondary | ICD-10-CM

## 2013-06-25 DIAGNOSIS — F3130 Bipolar disorder, current episode depressed, mild or moderate severity, unspecified: Secondary | ICD-10-CM
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
## 2013-06-29 ENCOUNTER — Ambulatory Visit: Payer: Medicare Other | Admitting: Physical Therapy

## 2013-06-30 ENCOUNTER — Ambulatory Visit: Payer: Medicare Other | Admitting: Physical Therapy

## 2013-07-02 ENCOUNTER — Ambulatory Visit: Payer: Medicare Other | Admitting: Physical Therapy

## 2013-07-06 ENCOUNTER — Ambulatory Visit: Payer: Medicare Other | Admitting: Physical Therapy

## 2013-07-09 ENCOUNTER — Ambulatory Visit: Payer: Medicare Other | Admitting: Physical Therapy

## 2013-07-13 ENCOUNTER — Ambulatory Visit: Payer: Medicare Other | Admitting: Physical Therapy

## 2013-07-16 ENCOUNTER — Ambulatory Visit: Payer: Medicare Other | Admitting: Physical Therapy

## 2013-07-20 ENCOUNTER — Ambulatory Visit: Payer: Medicare Other | Admitting: Physical Therapy

## 2013-07-23 ENCOUNTER — Ambulatory Visit: Payer: Medicare Other | Admitting: Physical Therapy

## 2013-07-27 ENCOUNTER — Telehealth: Payer: Self-pay | Admitting: Family Medicine

## 2013-07-27 NOTE — Telephone Encounter (Signed)
Pt requesting refill of HYDROcodone-acetaminophen (NORCO) 10-325 MG per tablet °

## 2013-07-28 MED ORDER — HYDROCODONE-ACETAMINOPHEN 10-325 MG PO TABS
1.0000 | ORAL_TABLET | Freq: Four times a day (QID) | ORAL | Status: DC | PRN
Start: 2013-07-28 — End: 2013-07-28

## 2013-07-28 MED ORDER — HYDROCODONE-ACETAMINOPHEN 10-325 MG PO TABS: 1.0000 | ORAL_TABLET | Freq: Four times a day (QID) | ORAL | Status: DC | PRN

## 2013-07-28 NOTE — Telephone Encounter (Signed)
Script is ready for pick up and I spoke with pt.  

## 2013-07-28 NOTE — Telephone Encounter (Signed)
Done for 3 months

## 2013-08-18 ENCOUNTER — Telehealth: Payer: Self-pay | Admitting: Family Medicine

## 2013-08-18 ENCOUNTER — Ambulatory Visit (INDEPENDENT_AMBULATORY_CARE_PROVIDER_SITE_OTHER): Payer: Medicare Other | Admitting: Family Medicine

## 2013-08-18 ENCOUNTER — Encounter: Payer: Self-pay | Admitting: Family Medicine

## 2013-08-18 VITALS — BP 112/70 | HR 83 | Temp 97.6°F | Ht 63.0 in | Wt 160.0 lb

## 2013-08-18 DIAGNOSIS — R42 Dizziness and giddiness: Secondary | ICD-10-CM

## 2013-08-18 DIAGNOSIS — F319 Bipolar disorder, unspecified: Secondary | ICD-10-CM

## 2013-08-18 DIAGNOSIS — R413 Other amnesia: Secondary | ICD-10-CM

## 2013-08-18 DIAGNOSIS — I10 Essential (primary) hypertension: Secondary | ICD-10-CM

## 2013-08-18 DIAGNOSIS — E785 Hyperlipidemia, unspecified: Secondary | ICD-10-CM

## 2013-08-18 MED ORDER — VARENICLINE TARTRATE 1 MG PO TABS: 1.0000 mg | ORAL_TABLET | Freq: Two times a day (BID) | ORAL | Status: DC

## 2013-08-18 MED ORDER — LAMOTRIGINE 100 MG PO TABS: 200.0000 mg | ORAL_TABLET | Freq: Every day | ORAL | Status: DC

## 2013-08-18 MED ORDER — VARENICLINE TARTRATE 0.5 MG X 11 & 1 MG X 42 PO MISC: ORAL | Status: DC

## 2013-08-18 NOTE — Progress Notes (Signed)
   Subjective:    Patient ID: [name] [name], female    DOB: [birth year], 58 y.o.   MRN: [id number]  HPI Here to follow up and to get labs checked. She is recovering from breast reduction surgery and this was very beneficial to her. She has lost weight an feels more energy now. She saw Dr. Haroldine Lawsrossley who did an ENG on her and he greatly improved her chronic vertigo. She wants to get off Zocor if possible.    Review of Systems  Constitutional: Negative.   Respiratory: Negative.   Cardiovascular: Negative.   Neurological: Negative.   Psychiatric/Behavioral: Negative.        Objective:   Physical Exam  Constitutional: She is oriented to person, place, and time. She appears well-developed and well-nourished.  Cardiovascular: Normal rate, regular rhythm, normal heart sounds and intact distal pulses.   Pulmonary/Chest: Effort normal and breath sounds normal.  Neurological: She is alert and oriented to person, place, and time.  Psychiatric: She has a normal mood and affect. Her behavior is normal. Thought content normal.          Assessment & Plan:  She is doing very well. Set up fasting labs.

## 2013-08-18 NOTE — Progress Notes (Signed)
Pre visit review using our clinic review tool, if applicable. No additional management support is needed unless otherwise documented below in the visit note. 

## 2013-08-18 NOTE — Telephone Encounter (Signed)
Relevant patient education assigned to patient using Emmi. ° °

## 2013-08-18 NOTE — Addendum Note (Signed)
Addended by: Gershon CraneFRY, Jamarius Saha A on: 08/18/2013 03:15 PM   Modules accepted: Orders

## 2013-08-19 ENCOUNTER — Telehealth: Payer: Self-pay | Admitting: Family Medicine

## 2013-08-19 NOTE — Telephone Encounter (Signed)
Relevant patient education mailed to patient.  

## 2013-08-25 ENCOUNTER — Other Ambulatory Visit (INDEPENDENT_AMBULATORY_CARE_PROVIDER_SITE_OTHER): Payer: Medicare Other

## 2013-08-25 DIAGNOSIS — I10 Essential (primary) hypertension: Secondary | ICD-10-CM

## 2013-08-25 DIAGNOSIS — E785 Hyperlipidemia, unspecified: Secondary | ICD-10-CM

## 2013-08-25 DIAGNOSIS — Z0000 Encounter for general adult medical examination without abnormal findings: Secondary | ICD-10-CM

## 2013-08-25 LAB — POCT URINALYSIS DIPSTICK
Bilirubin, UA: NEGATIVE
Blood, UA: NEGATIVE
Glucose, UA: NEGATIVE
Ketones, UA: NEGATIVE
Leukocytes, UA: NEGATIVE
Nitrite, UA: NEGATIVE
Protein, UA: NEGATIVE
Spec Grav, UA: 1.02
Urobilinogen, UA: 0.2
pH, UA: 6.5

## 2013-08-25 LAB — BASIC METABOLIC PANEL
BUN: 13 mg/dL (ref 6–23)
CO2: 31 mEq/L (ref 19–32)
Calcium: 9.7 mg/dL (ref 8.4–10.5)
Chloride: 102 mEq/L (ref 96–112)
Creatinine, Ser: 0.6 mg/dL (ref 0.4–1.2)
GFR: 115.71 mL/min (ref >60.00)
Glucose, Bld: 86 mg/dL (ref 70–99)
Potassium: 3.7 mEq/L (ref 3.5–5.1)
Sodium: 140 mEq/L (ref 135–145)

## 2013-08-25 LAB — LIPID PANEL
Cholesterol: 171 mg/dL (ref 0–200)
HDL: 46.5 mg/dL (ref >39.00)
LDL Cholesterol: 103 mg/dL — ABNORMAL HIGH (ref 0–99)
Total CHOL/HDL Ratio: 4
Triglycerides: 107 mg/dL (ref 0.0–149.0)
VLDL: 21.4 mg/dL (ref 0.0–40.0)

## 2013-08-25 LAB — TSH: TSH: 0.35 u[IU]/mL (ref 0.35–5.50)

## 2013-08-25 LAB — CBC WITH DIFFERENTIAL/PLATELET
Basophils Absolute: 0 10*3/uL (ref 0.0–0.1)
Basophils Relative: 0.4 % (ref 0.0–3.0)
Eosinophils Absolute: 0.2 10*3/uL (ref 0.0–0.7)
Eosinophils Relative: 2.5 % (ref 0.0–5.0)
HCT: 38.8 % (ref 36.0–46.0)
Hemoglobin: 13.1 g/dL (ref 12.0–15.0)
Lymphocytes Relative: 30.5 % (ref 12.0–46.0)
Lymphs Abs: 1.9 10*3/uL (ref 0.7–4.0)
MCHC: 33.7 g/dL (ref 30.0–36.0)
MCV: 91.8 fl (ref 78.0–100.0)
Monocytes Absolute: 0.3 10*3/uL (ref 0.1–1.0)
Monocytes Relative: 4.9 % (ref 3.0–12.0)
Neutro Abs: 3.8 10*3/uL (ref 1.4–7.7)
Neutrophils Relative %: 61.7 % (ref 43.0–77.0)
Platelets: 205 10*3/uL (ref 150.0–400.0)
RBC: 4.22 Mil/uL (ref 3.87–5.11)
RDW: 13.1 % (ref 11.5–14.6)
WBC: 6.2 10*3/uL (ref 4.5–10.5)

## 2013-08-26 LAB — HEPATIC FUNCTION PANEL
ALT: 17 U/L (ref 0–35)
AST: 31 U/L (ref 0–37)
Albumin: 4.5 g/dL (ref 3.5–5.2)
Alkaline Phosphatase: 56 U/L (ref 39–117)
Bilirubin, Direct: 0 mg/dL (ref 0.0–0.3)
Total Bilirubin: 0.4 mg/dL (ref 0.3–1.2)
Total Protein: 7.8 g/dL (ref 6.0–8.3)

## 2013-09-02 ENCOUNTER — Telehealth: Payer: Self-pay | Admitting: Nurse Practitioner

## 2013-09-02 NOTE — Telephone Encounter (Signed)
Spoke with patient. Advised that she was not tested for Hepatitis A while here for her annual exam. Advised that is usually a foodborne/waterborne illness and that is not a test that is usually done here in our office. Patient states she was looking at "my receipt from my visit" and she wasn't sure. I advised no and that testing was not indicated.  Patient is agreeable. Denies any exposure.   Routing to provider for final review. Patient agreeable to disposition. Will close encounter

## 2013-09-02 NOTE — Telephone Encounter (Signed)
Pt is calling to see if when she came in on 04/20/13 for her annual if she was tested for Hep A virus

## 2013-09-11 ENCOUNTER — Other Ambulatory Visit: Payer: Self-pay | Admitting: Family Medicine

## 2013-10-13 ENCOUNTER — Ambulatory Visit: Payer: Medicare Other | Admitting: Physical Therapy

## 2013-10-20 ENCOUNTER — Ambulatory Visit: Payer: Medicare Other | Admitting: Physical Therapy

## 2013-10-21 ENCOUNTER — Other Ambulatory Visit: Payer: Self-pay | Admitting: Family Medicine

## 2013-10-27 ENCOUNTER — Ambulatory Visit: Payer: Medicare Other | Attending: Family Medicine | Admitting: Physical Therapy

## 2013-10-27 DIAGNOSIS — M546 Pain in thoracic spine: Secondary | ICD-10-CM | POA: Insufficient documentation

## 2013-10-27 DIAGNOSIS — M25559 Pain in unspecified hip: Secondary | ICD-10-CM | POA: Insufficient documentation

## 2013-10-27 DIAGNOSIS — IMO0001 Reserved for inherently not codable concepts without codable children: Secondary | ICD-10-CM | POA: Insufficient documentation

## 2013-10-27 DIAGNOSIS — R5381 Other malaise: Secondary | ICD-10-CM | POA: Insufficient documentation

## 2013-10-27 DIAGNOSIS — M545 Low back pain, unspecified: Secondary | ICD-10-CM | POA: Insufficient documentation

## 2013-10-29 ENCOUNTER — Telehealth: Payer: Self-pay | Admitting: Family Medicine

## 2013-10-29 NOTE — Telephone Encounter (Signed)
Pt needs new rx hydrocodone °

## 2013-10-30 MED ORDER — HYDROCODONE-ACETAMINOPHEN 10-325 MG PO TABS: 1.0000 | ORAL_TABLET | Freq: Four times a day (QID) | ORAL | Status: DC | PRN

## 2013-10-30 NOTE — Telephone Encounter (Signed)
done

## 2013-10-30 NOTE — Addendum Note (Signed)
Addended by: Gershon CraneFRY, Levon Boettcher A on: 10/30/2013 01:12 PM   Modules accepted: Orders

## 2013-10-30 NOTE — Telephone Encounter (Signed)
Script is ready for pick up and I left a voice message for pt. 

## 2013-11-03 ENCOUNTER — Ambulatory Visit: Payer: Medicare Other | Admitting: Rehabilitation

## 2013-11-17 DIAGNOSIS — Z0279 Encounter for issue of other medical certificate: Secondary | ICD-10-CM

## 2013-11-19 ENCOUNTER — Encounter: Payer: Self-pay | Admitting: Rehabilitation

## 2013-11-25 ENCOUNTER — Other Ambulatory Visit: Payer: Self-pay | Admitting: Family Medicine

## 2013-12-16 ENCOUNTER — Telehealth: Payer: Self-pay | Admitting: Family Medicine

## 2013-12-16 NOTE — Telephone Encounter (Signed)
[facility] - [place], [place] - [address] is requesting re-fill on alprazolam (XANAX) 2 MG tablet

## 2013-12-17 MED ORDER — ALPRAZOLAM 2 MG PO TABS: 2.0000 mg | ORAL_TABLET | Freq: Three times a day (TID) | ORAL | Status: DC | PRN

## 2013-12-17 NOTE — Telephone Encounter (Signed)
I called in script 

## 2013-12-17 NOTE — Telephone Encounter (Signed)
Call in #90 with 5 rf 

## 2014-01-07 ENCOUNTER — Encounter: Payer: Self-pay | Admitting: Nurse Practitioner

## 2014-01-18 ENCOUNTER — Telehealth: Payer: Self-pay | Admitting: Nurse Practitioner

## 2014-01-29 ENCOUNTER — Telehealth: Payer: Self-pay | Admitting: Family Medicine

## 2014-01-29 NOTE — Telephone Encounter (Signed)
[facility] - [place], [place] - [address] is  Requesting re-fill on HYDROcodone-acetaminophen (NORCO) 10-325 MG per tablet

## 2014-02-01 MED ORDER — HYDROCODONE-ACETAMINOPHEN 10-325 MG PO TABS: 1.0000 | ORAL_TABLET | Freq: Four times a day (QID) | ORAL | Status: DC | PRN

## 2014-02-01 NOTE — Telephone Encounter (Signed)
done

## 2014-02-02 NOTE — Telephone Encounter (Signed)
Script is ready for pick up and I left a voice message for pt. 

## 2014-02-10 ENCOUNTER — Telehealth: Payer: Self-pay | Admitting: Family Medicine

## 2014-02-10 NOTE — Telephone Encounter (Signed)
Refill request for Simvastatin 40 mg and send to [facility].

## 2014-02-11 MED ORDER — SIMVASTATIN 40 MG PO TABS: ORAL_TABLET | ORAL | Status: DC

## 2014-02-11 NOTE — Telephone Encounter (Signed)
I sent script e-scribe. 

## 2014-02-23 ENCOUNTER — Telehealth: Payer: Self-pay | Admitting: Family Medicine

## 2014-02-23 NOTE — Telephone Encounter (Signed)
Refill request for Omeprazole 20 mg take 1 po bid and send to [facility].

## 2014-02-24 ENCOUNTER — Other Ambulatory Visit: Payer: Self-pay | Admitting: Family Medicine

## 2014-02-24 NOTE — Telephone Encounter (Signed)
Script was sent e-scribe 

## 2014-04-05 ENCOUNTER — Telehealth: Payer: Self-pay | Admitting: Family Medicine

## 2014-04-05 NOTE — Telephone Encounter (Signed)
Pt needs new rx hydrocodone °

## 2014-04-06 NOTE — Telephone Encounter (Signed)
NO she is not due until 05-04-14

## 2014-04-07 NOTE — Telephone Encounter (Signed)
Disregard pt has another [facility] at [facility]

## 2014-04-07 NOTE — Telephone Encounter (Signed)
Pt stated she is due on 04/03/14 not 05-04-14 please verify

## 2014-04-07 NOTE — Telephone Encounter (Signed)
I left a voice message with below information. 

## 2014-04-16 ENCOUNTER — Ambulatory Visit: Payer: Medicare Other | Admitting: Nurse Practitioner

## 2014-04-16 ENCOUNTER — Encounter: Payer: Self-pay | Admitting: Nurse Practitioner

## 2014-04-16 ENCOUNTER — Other Ambulatory Visit: Payer: Self-pay | Admitting: Nurse Practitioner

## 2014-04-16 DIAGNOSIS — Z1231 Encounter for screening mammogram for malignant neoplasm of breast: Secondary | ICD-10-CM

## 2014-04-19 ENCOUNTER — Encounter: Payer: Self-pay | Admitting: Family Medicine

## 2014-04-21 ENCOUNTER — Ambulatory Visit: Payer: Medicare Other

## 2014-04-23 ENCOUNTER — Ambulatory Visit (HOSPITAL_COMMUNITY): Payer: Medicare Other

## 2014-04-29 ENCOUNTER — Telehealth: Payer: Self-pay | Admitting: Nurse Practitioner

## 2014-05-10 ENCOUNTER — Telehealth: Payer: Self-pay | Admitting: Family Medicine

## 2014-05-10 NOTE — Telephone Encounter (Signed)
Pt request refill HYDROcodone-acetaminophen (NORCO) 10-325 MG per tablet °

## 2014-05-11 MED ORDER — HYDROCODONE-ACETAMINOPHEN 10-325 MG PO TABS: 1.0000 | ORAL_TABLET | Freq: Four times a day (QID) | ORAL | Status: DC | PRN

## 2014-05-11 NOTE — Telephone Encounter (Signed)
Script is ready for pick up and I left a voice message for pt. 

## 2014-05-11 NOTE — Telephone Encounter (Signed)
done

## 2014-05-12 ENCOUNTER — Other Ambulatory Visit: Payer: Self-pay | Admitting: Nurse Practitioner

## 2014-05-12 DIAGNOSIS — Z1231 Encounter for screening mammogram for malignant neoplasm of breast: Secondary | ICD-10-CM

## 2014-05-21 ENCOUNTER — Ambulatory Visit (HOSPITAL_COMMUNITY): Payer: Medicare Other

## 2014-05-26 ENCOUNTER — Other Ambulatory Visit (INDEPENDENT_AMBULATORY_CARE_PROVIDER_SITE_OTHER): Payer: Medicare Other

## 2014-05-26 DIAGNOSIS — E785 Hyperlipidemia, unspecified: Secondary | ICD-10-CM

## 2014-05-26 DIAGNOSIS — Z0000 Encounter for general adult medical examination without abnormal findings: Secondary | ICD-10-CM

## 2014-05-26 DIAGNOSIS — I10 Essential (primary) hypertension: Secondary | ICD-10-CM | POA: Diagnosis not present

## 2014-05-26 LAB — HEPATIC FUNCTION PANEL
ALT: 32 U/L (ref 0–35)
AST: 33 U/L (ref 0–37)
Albumin: 4.3 g/dL (ref 3.5–5.2)
Alkaline Phosphatase: 62 U/L (ref 39–117)
Bilirubin, Direct: 0 mg/dL (ref 0.0–0.3)
Total Bilirubin: 0.4 mg/dL (ref 0.2–1.2)
Total Protein: 7.9 g/dL (ref 6.0–8.3)

## 2014-05-26 LAB — BASIC METABOLIC PANEL
BUN: 16 mg/dL (ref 6–23)
CO2: 30 mEq/L (ref 19–32)
Calcium: 9.9 mg/dL (ref 8.4–10.5)
Chloride: 98 mEq/L (ref 96–112)
Creatinine, Ser: 0.8 mg/dL (ref 0.4–1.2)
GFR: 84.08 mL/min (ref >60.00)
Glucose, Bld: 96 mg/dL (ref 70–99)
Potassium: 3.8 mEq/L (ref 3.5–5.1)
Sodium: 137 mEq/L (ref 135–145)

## 2014-05-26 LAB — CBC WITH DIFFERENTIAL/PLATELET
Basophils Absolute: 0 10*3/uL (ref 0.0–0.1)
Basophils Relative: 0.6 % (ref 0.0–3.0)
Eosinophils Absolute: 0.2 10*3/uL (ref 0.0–0.7)
Eosinophils Relative: 1.8 % (ref 0.0–5.0)
HCT: 42.3 % (ref 36.0–46.0)
Hemoglobin: 14 g/dL (ref 12.0–15.0)
Lymphocytes Relative: 24.2 % (ref 12.0–46.0)
Lymphs Abs: 2.1 10*3/uL (ref 0.7–4.0)
MCHC: 33 g/dL (ref 30.0–36.0)
MCV: 91.8 fl (ref 78.0–100.0)
Monocytes Absolute: 0.4 10*3/uL (ref 0.1–1.0)
Monocytes Relative: 4.9 % (ref 3.0–12.0)
Neutro Abs: 5.9 10*3/uL (ref 1.4–7.7)
Neutrophils Relative %: 68.5 % (ref 43.0–77.0)
Platelets: 190 10*3/uL (ref 150.0–400.0)
RBC: 4.61 Mil/uL (ref 3.87–5.11)
RDW: 13.9 % (ref 11.5–15.5)
WBC: 8.6 10*3/uL (ref 4.0–10.5)

## 2014-05-26 LAB — LIPID PANEL
Cholesterol: 295 mg/dL — ABNORMAL HIGH (ref 0–200)
HDL: 53.3 mg/dL (ref >39.00)
NonHDL: 241.7
Total CHOL/HDL Ratio: 6
Triglycerides: 305 mg/dL — ABNORMAL HIGH (ref 0.0–149.0)
VLDL: 61 mg/dL — ABNORMAL HIGH (ref 0.0–40.0)

## 2014-05-26 LAB — POCT URINALYSIS DIPSTICK
Bilirubin, UA: NEGATIVE
Blood, UA: NEGATIVE
Glucose, UA: NEGATIVE
Leukocytes, UA: NEGATIVE
Nitrite, UA: NEGATIVE
Protein, UA: NEGATIVE
Spec Grav, UA: 1.02
Urobilinogen, UA: 0.2
pH, UA: 5

## 2014-05-26 LAB — TSH: TSH: 3.2 u[IU]/mL (ref 0.35–4.50)

## 2014-05-26 LAB — LDL CHOLESTEROL, DIRECT: Direct LDL: 204.4 mg/dL

## 2014-05-28 MED ORDER — ROSUVASTATIN CALCIUM 20 MG PO TABS: 20.0000 mg | ORAL_TABLET | Freq: Every day | ORAL | Status: DC

## 2014-05-28 NOTE — Addendum Note (Signed)
Addended by: Aniceto BossNIMMONS, SYLVIA A on: 05/28/2014 02:16 PM   Modules accepted: Orders, Medications

## 2014-06-02 ENCOUNTER — Encounter: Payer: Self-pay | Admitting: Family Medicine

## 2014-06-02 ENCOUNTER — Ambulatory Visit (INDEPENDENT_AMBULATORY_CARE_PROVIDER_SITE_OTHER): Payer: Medicare Other | Admitting: Family Medicine

## 2014-06-02 VITALS — BP 119/79 | HR 85 | Temp 98.1°F | Ht 63.0 in | Wt 170.0 lb

## 2014-06-02 DIAGNOSIS — Z0000 Encounter for general adult medical examination without abnormal findings: Secondary | ICD-10-CM

## 2014-06-02 DIAGNOSIS — E785 Hyperlipidemia, unspecified: Secondary | ICD-10-CM

## 2014-06-02 MED ORDER — SIMVASTATIN 40 MG PO TABS: 40.0000 mg | ORAL_TABLET | Freq: Every day | ORAL | Status: DC

## 2014-06-02 NOTE — Progress Notes (Signed)
Pre visit review using our clinic review tool, if applicable. No additional management support is needed unless otherwise documented below in the visit note. 

## 2014-06-02 NOTE — Progress Notes (Signed)
   Subjective:    Patient ID: [name] [name], female    DOB: July 19, 1955, 58 y.o.   MRN: [id number]  HPI 58 yr old female for a cpx. She is doing well. Her lipid levels are back up and she admits to ignoring her diet. She cannot afford to use Crestor so she asks to stay on Zocor. She is committed to watching a stricter diet.    Review of Systems  Constitutional: Negative.   HENT: Negative.   Eyes: Negative.   Respiratory: Negative.   Cardiovascular: Negative.   Gastrointestinal: Negative.   Genitourinary: Negative for dysuria, urgency, frequency, hematuria, flank pain, decreased urine volume, enuresis, difficulty urinating, pelvic pain and dyspareunia.  Musculoskeletal: Negative.   Skin: Negative.   Neurological: Negative.   Psychiatric/Behavioral: Negative.        Objective:   Physical Exam  Constitutional: She is oriented to person, place, and time. She appears well-developed and well-nourished. No distress.  HENT:  Head: Normocephalic and atraumatic.  Right Ear: External ear normal.  Left Ear: External ear normal.  Nose: Nose normal.  Mouth/Throat: Oropharynx is clear and moist. No oropharyngeal exudate.  Eyes: Conjunctivae and EOM are normal. Pupils are equal, round, and reactive to light. No scleral icterus.  Neck: Normal range of motion. Neck supple. No JVD present. No thyromegaly present.  Cardiovascular: Normal rate, regular rhythm, normal heart sounds and intact distal pulses.  Exam reveals no gallop and no friction rub.   No murmur heard. EKG normal   Pulmonary/Chest: Effort normal and breath sounds normal. No respiratory distress. She has no wheezes. She has no rales. She exhibits no tenderness.  Abdominal: Soft. Bowel sounds are normal. She exhibits no distension and no mass. There is no tenderness. There is no rebound and no guarding.  Musculoskeletal: Normal range of motion. She exhibits no edema or tenderness.  Lymphadenopathy:    She has no cervical  adenopathy.  Neurological: She is alert and oriented to person, place, and time. She has normal reflexes. No cranial nerve deficit. She exhibits normal muscle tone. Coordination normal.  Skin: Skin is warm and dry. No rash noted. No erythema.  Psychiatric: She has a normal mood and affect. Her behavior is normal. Judgment and thought content normal.          Assessment & Plan:  Well exam. She will watch the diet and exercise. Recheck lipids in 6 months

## 2014-06-30 NOTE — Telephone Encounter (Signed)
Error will close encounter.

## 2014-07-05 ENCOUNTER — Telehealth: Payer: Self-pay | Admitting: Family Medicine

## 2014-07-05 ENCOUNTER — Other Ambulatory Visit: Payer: Self-pay

## 2014-07-05 MED ORDER — QUETIAPINE FUMARATE 50 MG PO TABS: 50.0000 mg | ORAL_TABLET | Freq: Every day | ORAL | Status: DC

## 2014-07-05 NOTE — Telephone Encounter (Signed)
Because when we saw her in March she had lost a lot of weight, and we needed to make sure nothing was wrong. Then in December this was done because it is a standard part of physical labs that everyone gets

## 2014-07-05 NOTE — Telephone Encounter (Signed)
Rx request for quetiapine fumarete 50 mg tablet- Take 1 tablet by mouth at bedtime.   Pharm:  [facility]  Rx sent to pharmacy for 6 months.

## 2014-07-05 NOTE — Telephone Encounter (Signed)
Pt would like to know why did she have tsh check in mar 2015 and dec 2015.

## 2014-07-06 ENCOUNTER — Other Ambulatory Visit: Payer: Self-pay | Admitting: Obstetrics & Gynecology

## 2014-07-06 DIAGNOSIS — N6310 Unspecified lump in the right breast, unspecified quadrant: Secondary | ICD-10-CM

## 2014-07-06 NOTE — Telephone Encounter (Signed)
Can we recommend anything at all for the pt to do?

## 2014-07-06 NOTE — Telephone Encounter (Signed)
I spoke with pt and went over the below information. She stated that her insurance will not cover the cost of the last TSH lab draw, they said that it was not necessary to have that lab drawn again. The cost of this is $ 157.00.

## 2014-07-13 ENCOUNTER — Other Ambulatory Visit: Payer: Self-pay | Admitting: *Deleted

## 2014-07-13 ENCOUNTER — Ambulatory Visit
Admission: RE | Admit: 2014-07-13 | Discharge: 2014-07-13 | Disposition: A | Discharge status: Home / Self Care | Payer: PPO | Source: Ambulatory Visit | Attending: Obstetrics & Gynecology | Admitting: Obstetrics & Gynecology

## 2014-07-13 DIAGNOSIS — N6310 Unspecified lump in the right breast, unspecified quadrant: Secondary | ICD-10-CM

## 2014-07-14 NOTE — Telephone Encounter (Signed)
Call in #90 with 5 rf 

## 2014-07-15 MED ORDER — ALPRAZOLAM 2 MG PO TABS: 2.0000 mg | ORAL_TABLET | Freq: Three times a day (TID) | ORAL | Status: DC | PRN

## 2014-07-15 NOTE — Telephone Encounter (Signed)
rx called in to [facility]

## 2014-07-20 NOTE — Telephone Encounter (Signed)
Pt will mail me a copy of her EOB received from her insurance company for review as we have not billed her for any charges at this time.

## 2014-08-16 ENCOUNTER — Other Ambulatory Visit: Payer: Self-pay | Admitting: Family Medicine

## 2014-08-16 MED ORDER — HYDROCODONE-ACETAMINOPHEN 10-325 MG PO TABS: 1.0000 | ORAL_TABLET | Freq: Four times a day (QID) | ORAL | Status: DC | PRN

## 2014-08-16 NOTE — Telephone Encounter (Signed)
done

## 2014-08-16 NOTE — Telephone Encounter (Signed)
Pt request refill of the following: HYDROcodone-acetaminophen (NORCO) 10-325 MG per tablet ° ° °Phamacy: ° °

## 2014-08-16 NOTE — Telephone Encounter (Signed)
Pt aware.

## 2014-09-07 ENCOUNTER — Other Ambulatory Visit: Payer: Self-pay | Admitting: Family Medicine

## 2014-09-20 ENCOUNTER — Other Ambulatory Visit: Payer: Self-pay | Admitting: Family Medicine

## 2014-10-04 ENCOUNTER — Other Ambulatory Visit: Payer: Self-pay | Admitting: Family Medicine

## 2014-11-11 NOTE — Telephone Encounter (Signed)
done

## 2014-11-25 ENCOUNTER — Telehealth: Payer: Self-pay | Admitting: Family Medicine

## 2014-11-25 NOTE — Telephone Encounter (Signed)
Pt request refill of the following: HYDROcodone-acetaminophen (NORCO) 10-325 MG per tablet ° ° °Phamacy: ° °

## 2014-11-26 MED ORDER — HYDROCODONE-ACETAMINOPHEN 10-325 MG PO TABS: 1.0000 | ORAL_TABLET | Freq: Four times a day (QID) | ORAL | Status: DC | PRN

## 2014-11-26 NOTE — Telephone Encounter (Signed)
Left a message for pt that rx is ready for pick up.  

## 2014-11-26 NOTE — Telephone Encounter (Signed)
done

## 2014-12-04 ENCOUNTER — Other Ambulatory Visit: Payer: Self-pay | Admitting: Family Medicine

## 2014-12-09 ENCOUNTER — Other Ambulatory Visit: Payer: Self-pay | Admitting: Obstetrics & Gynecology

## 2014-12-09 DIAGNOSIS — N63 Unspecified lump in unspecified breast: Secondary | ICD-10-CM

## 2015-01-03 ENCOUNTER — Emergency Department (HOSPITAL_COMMUNITY)
Admission: EM | Admit: 2015-01-03 | Discharge: 2015-01-03 | Disposition: A | Discharge status: Home / Self Care | Payer: PPO | Attending: Emergency Medicine | Admitting: Emergency Medicine

## 2015-01-03 ENCOUNTER — Encounter (HOSPITAL_COMMUNITY): Payer: Self-pay | Admitting: Emergency Medicine

## 2015-01-03 DIAGNOSIS — Z8669 Personal history of other diseases of the nervous system and sense organs: Secondary | ICD-10-CM | POA: Insufficient documentation

## 2015-01-03 DIAGNOSIS — M549 Dorsalgia, unspecified: Secondary | ICD-10-CM | POA: Diagnosis not present

## 2015-01-03 DIAGNOSIS — Z720 Tobacco use: Secondary | ICD-10-CM | POA: Insufficient documentation

## 2015-01-03 DIAGNOSIS — G8929 Other chronic pain: Secondary | ICD-10-CM | POA: Insufficient documentation

## 2015-01-03 DIAGNOSIS — Z7982 Long term (current) use of aspirin: Secondary | ICD-10-CM | POA: Insufficient documentation

## 2015-01-03 DIAGNOSIS — F329 Major depressive disorder, single episode, unspecified: Secondary | ICD-10-CM | POA: Insufficient documentation

## 2015-01-03 DIAGNOSIS — K219 Gastro-esophageal reflux disease without esophagitis: Secondary | ICD-10-CM | POA: Diagnosis not present

## 2015-01-03 DIAGNOSIS — Z79899 Other long term (current) drug therapy: Secondary | ICD-10-CM | POA: Insufficient documentation

## 2015-01-03 DIAGNOSIS — K5900 Constipation, unspecified: Secondary | ICD-10-CM | POA: Diagnosis not present

## 2015-01-03 DIAGNOSIS — I10 Essential (primary) hypertension: Secondary | ICD-10-CM | POA: Diagnosis not present

## 2015-01-03 DIAGNOSIS — Z90710 Acquired absence of both cervix and uterus: Secondary | ICD-10-CM | POA: Diagnosis not present

## 2015-01-03 DIAGNOSIS — E785 Hyperlipidemia, unspecified: Secondary | ICD-10-CM | POA: Diagnosis not present

## 2015-01-03 DIAGNOSIS — K921 Melena: Secondary | ICD-10-CM | POA: Diagnosis present

## 2015-01-03 LAB — COMPREHENSIVE METABOLIC PANEL
ALT: 17 U/L (ref 14–54)
AST: 27 U/L (ref 15–41)
Albumin: 4.1 g/dL (ref 3.5–5.0)
Alkaline Phosphatase: 50 U/L (ref 38–126)
Anion gap: 7 (ref 5–15)
BUN: 12 mg/dL (ref 6–20)
CO2: 30 mmol/L (ref 22–32)
Calcium: 9.8 mg/dL (ref 8.9–10.3)
Chloride: 102 mmol/L (ref 101–111)
Creatinine, Ser: 0.72 mg/dL (ref 0.44–1.00)
GFR calc Af Amer: >60 mL/min (ref >60)
GFR calc non Af Amer: >60 mL/min (ref >60)
Glucose, Bld: 128 mg/dL — ABNORMAL HIGH (ref 65–99)
Potassium: 4 mmol/L (ref 3.5–5.1)
Sodium: 139 mmol/L (ref 135–145)
Total Bilirubin: 0.4 mg/dL (ref 0.3–1.2)
Total Protein: 7 g/dL (ref 6.5–8.1)

## 2015-01-03 LAB — URINALYSIS, ROUTINE W REFLEX MICROSCOPIC
Bilirubin Urine: NEGATIVE
Glucose, UA: NEGATIVE mg/dL
Hgb urine dipstick: NEGATIVE
Ketones, ur: NEGATIVE mg/dL
Leukocytes, UA: NEGATIVE
Nitrite: NEGATIVE
Protein, ur: NEGATIVE mg/dL
Specific Gravity, Urine: 1.007 (ref 1.005–1.030)
Urobilinogen, UA: 0.2 mg/dL (ref 0.0–1.0)
pH: 6 (ref 5.0–8.0)

## 2015-01-03 LAB — CBC WITH DIFFERENTIAL/PLATELET
Basophils Absolute: 0 10*3/uL (ref 0.0–0.1)
Basophils Relative: 0 % (ref 0–1)
Eosinophils Absolute: 0.3 10*3/uL (ref 0.0–0.7)
Eosinophils Relative: 5 % (ref 0–5)
HCT: 38.2 % (ref 36.0–46.0)
Hemoglobin: 12.8 g/dL (ref 12.0–15.0)
Lymphocytes Relative: 35 % (ref 12–46)
Lymphs Abs: 1.9 10*3/uL (ref 0.7–4.0)
MCH: 30.5 pg (ref 26.0–34.0)
MCHC: 33.5 g/dL (ref 30.0–36.0)
MCV: 91 fL (ref 78.0–100.0)
Monocytes Absolute: 0.3 10*3/uL (ref 0.1–1.0)
Monocytes Relative: 6 % (ref 3–12)
Neutro Abs: 3 10*3/uL (ref 1.7–7.7)
Neutrophils Relative %: 54 % (ref 43–77)
Platelets: 167 10*3/uL (ref 150–400)
RBC: 4.2 MIL/uL (ref 3.87–5.11)
RDW: 13.1 % (ref 11.5–15.5)
WBC: 5.5 10*3/uL (ref 4.0–10.5)

## 2015-01-03 LAB — LIPASE, BLOOD: Lipase: 22 U/L (ref 22–51)

## 2015-01-03 LAB — POC OCCULT BLOOD, ED: Fecal Occult Bld: NEGATIVE

## 2015-01-03 MED ORDER — OMEPRAZOLE 20 MG PO CPDR: 20.0000 mg | DELAYED_RELEASE_CAPSULE | Freq: Two times a day (BID) | ORAL | Status: DC

## 2015-01-03 MED ORDER — ONDANSETRON HCL 4 MG/2ML IJ SOLN
4.0000 mg | Freq: Once | INTRAMUSCULAR | Status: AC
  Administered 2015-01-03: 4 mg via INTRAVENOUS
  Filled 2015-01-03: qty 2

## 2015-01-03 MED ORDER — SUCRALFATE 1 G PO TABS: 1.0000 g | ORAL_TABLET | Freq: Every day | ORAL | Status: DC

## 2015-01-03 MED ORDER — POLYETHYLENE GLYCOL 3350 17 GM/SCOOP PO POWD: 17.0000 g | Freq: Two times a day (BID) | ORAL | Status: DC

## 2015-01-03 MED ORDER — SODIUM CHLORIDE 0.9 % IV BOLUS (SEPSIS)
1000.0000 mL | Freq: Once | INTRAVENOUS | Status: AC
  Administered 2015-01-03: 1000 mL via INTRAVENOUS

## 2015-01-03 MED ORDER — GI COCKTAIL ~~LOC~~
30.0000 mL | Freq: Once | ORAL | Status: AC
  Administered 2015-01-03: 30 mL via ORAL
  Filled 2015-01-03: qty 30

## 2015-01-03 NOTE — Discharge Instructions (Signed)
Food Choices for Gastroesophageal Reflux Disease When you have gastroesophageal reflux disease (GERD), the foods you eat and your eating habits are very important. Choosing the right foods can help ease the discomfort of GERD. WHAT GENERAL GUIDELINES DO I NEED TO FOLLOW?  Choose fruits, vegetables, whole grains, low-fat dairy products, and low-fat meat, fish, and poultry.  Limit fats such as oils, salad dressings, butter, nuts, and avocado.  Keep a food diary to identify foods that cause symptoms.  Avoid foods that cause reflux. These may be different for different people.  Eat frequent small meals instead of three large meals each day.  Eat your meals slowly, in a relaxed setting.  Limit fried foods.  Cook foods using methods other than frying.  Avoid drinking alcohol.  Avoid drinking large amounts of liquids with your meals.  Avoid bending over or lying down until 2-3 hours after eating. WHAT FOODS ARE NOT RECOMMENDED? The following are some foods and drinks that may worsen your symptoms: Vegetables Tomatoes. Tomato juice. Tomato and spaghetti sauce. Chili peppers. Onion and garlic. Horseradish. Fruits Oranges, grapefruit, and lemon (fruit and juice). Meats High-fat meats, fish, and poultry. This includes hot dogs, ribs, ham, sausage, salami, and bacon. Dairy Whole milk and chocolate milk. Sour cream. Cream. Butter. Ice cream. Cream cheese.  Beverages Coffee and tea, with or without caffeine. Carbonated beverages or energy drinks. Condiments Hot sauce. Barbecue sauce.  Sweets/Desserts Chocolate and cocoa. Donuts. Peppermint and spearmint. Fats and Oils High-fat foods, including [place] fries and potato chips. Other Vinegar. Strong spices, such as black pepper, white pepper, red pepper, cayenne, curry powder, cloves, ginger, and chili powder. The items listed above may not be a complete list of foods and beverages to avoid. Contact your dietitian for more  information. Document Released: 06/04/2005 Document Revised: 06/09/2013 Document Reviewed: 04/08/2013 [facility] Patient Information 2015 [place], [place]. This information is not intended to replace advice given to you by your health care provider. Make sure you discuss any questions you have with your health care provider. Gastroesophageal Reflux Disease, Adult Gastroesophageal reflux disease (GERD) happens when acid from your stomach flows up into the esophagus. When acid comes in contact with the esophagus, the acid causes soreness (inflammation) in the esophagus. Over time, GERD may create small holes (ulcers) in the lining of the esophagus. CAUSES   Increased body weight. This puts pressure on the stomach, making acid rise from the stomach into the esophagus.  Smoking. This increases acid production in the stomach.  Drinking alcohol. This causes decreased pressure in the lower esophageal sphincter (valve or ring of muscle between the esophagus and stomach), allowing acid from the stomach into the esophagus.  Late evening meals and a full stomach. This increases pressure and acid production in the stomach.  A malformed lower esophageal sphincter. Sometimes, no cause is found. SYMPTOMS   Burning pain in the lower part of the mid-chest behind the breastbone and in the mid-stomach area. This may occur twice a week or more often.  Trouble swallowing.  Sore throat.  Dry cough.  Asthma-like symptoms including chest tightness, shortness of breath, or wheezing. DIAGNOSIS  Your caregiver may be able to diagnose GERD based on your symptoms. In some cases, X-rays and other tests may be done to check for complications or to check the condition of your stomach and esophagus. TREATMENT  Your caregiver may recommend over-the-counter or prescription medicines to help decrease acid production. Ask your caregiver before starting or adding any new medicines.  HOME CARE  INSTRUCTIONS   Change the  factors that you can control. Ask your caregiver for guidance concerning weight loss, quitting smoking, and alcohol consumption.  Avoid foods and drinks that make your symptoms worse, such as:  Caffeine or alcoholic drinks.  Chocolate.  Peppermint or mint flavorings.  Garlic and onions.  Spicy foods.  Citrus fruits, such as oranges, lemons, or limes.  Tomato-based foods such as sauce, chili, salsa, and pizza.  Fried and fatty foods.  Avoid lying down for the 3 hours prior to your bedtime or prior to taking a nap.  Eat small, frequent meals instead of large meals.  Wear loose-fitting clothing. Do not wear anything tight around your waist that causes pressure on your stomach.  Raise the head of your bed 6 to 8 inches with wood blocks to help you sleep. Extra pillows will not help.  Only take over-the-counter or prescription medicines for pain, discomfort, or fever as directed by your caregiver.  Do not take aspirin, ibuprofen, or other nonsteroidal anti-inflammatory drugs (NSAIDs). SEEK IMMEDIATE MEDICAL CARE IF:   You have pain in your arms, neck, jaw, teeth, or back.  Your pain increases or changes in intensity or duration.  You develop nausea, vomiting, or sweating (diaphoresis).  You develop shortness of breath, or you faint.  Your vomit is green, yellow, black, or looks like coffee grounds or blood.  Your stool is red, bloody, or black. These symptoms could be signs of other problems, such as heart disease, gastric bleeding, or esophageal bleeding. MAKE SURE YOU:   Understand these instructions.  Will watch your condition.  Will get help right away if you are not doing well or get worse. Document Released: 03/14/2005 Document Revised: 08/27/2011 Document Reviewed: 12/22/2010 [facility] Patient Information 2015 [place], [place]. This information is not intended to replace advice given to you by your health care provider. Make sure you discuss any questions you have  with your health care provider. Constipation Constipation is when a person has fewer than three bowel movements a week, has difficulty having a bowel movement, or has stools that are dry, hard, or larger than normal. As people grow older, constipation is more common. If you try to fix constipation with medicines that make you have a bowel movement (laxatives), the problem may get worse. Long-term laxative use may cause the muscles of the colon to become weak. A low-fiber diet, not taking in enough fluids, and taking certain medicines may make constipation worse.  CAUSES   Certain medicines, such as antidepressants, pain medicine, iron supplements, antacids, and water pills.   Certain diseases, such as diabetes, irritable bowel syndrome (IBS), thyroid disease, or depression.   Not drinking enough water.   Not eating enough fiber-rich foods.   Stress or travel.   Lack of physical activity or exercise.   Ignoring the urge to have a bowel movement.   Using laxatives too much.  SIGNS AND SYMPTOMS   Having fewer than three bowel movements a week.   Straining to have a bowel movement.   Having stools that are hard, dry, or larger than normal.   Feeling full or bloated.   Pain in the lower abdomen.   Not feeling relief after having a bowel movement.  DIAGNOSIS  Your health care provider will take a medical history and perform a physical exam. Further testing may be done for severe constipation. Some tests may include:  A barium enema X-ray to examine your rectum, colon, and, sometimes, your small intestine.   A  sigmoidoscopy to examine your lower colon.   A colonoscopy to examine your entire colon. TREATMENT  Treatment will depend on the severity of your constipation and what is causing it. Some dietary treatments include drinking more fluids and eating more fiber-rich foods. Lifestyle treatments may include regular exercise. If these diet and lifestyle  recommendations do not help, your health care provider may recommend taking over-the-counter laxative medicines to help you have bowel movements. Prescription medicines may be prescribed if over-the-counter medicines do not work.  HOME CARE INSTRUCTIONS   Eat foods that have a lot of fiber, such as fruits, vegetables, whole grains, and beans.  Limit foods high in fat and processed sugars, such as french fries, hamburgers, cookies, candies, and soda.   A fiber supplement may be added to your diet if you cannot get enough fiber from foods.   Drink enough fluids to keep your urine clear or pale yellow.   Exercise regularly or as directed by your health care provider.   Go to the restroom when you have the urge to go. Do not hold it.   Only take over-the-counter or prescription medicines as directed by your health care provider. Do not take other medicines for constipation without talking to your health care provider first.  SEEK IMMEDIATE MEDICAL CARE IF:   You have bright red blood in your stool.   Your constipation lasts for more than 4 days or gets worse.   You have abdominal or rectal pain.   You have thin, pencil-like stools.   You have unexplained weight loss. MAKE SURE YOU:   Understand these instructions.  Will watch your condition.  Will get help right away if you are not doing well or get worse. Document Released: 03/02/2004 Document Revised: 06/09/2013 Document Reviewed: 03/16/2013 [facility] Patient Information 2015 [employer], [place]. This information is not intended to replace advice given to you by your health care provider. Make sure you discuss any questions you have with your health care provider.

## 2015-01-03 NOTE — ED Notes (Addendum)
Tarry black stools for three weeks; burps that taste like tar to her. Sharp pains in lower abdomen and burning in esophagus. Multiple other complaints: No energy, weakness, dizziness. Breast pain from old surgery. Chronic back pain. Lightheadedness worse (history of Meneir's since 2009). Has gained 40 lbs in 4 months. Reports high stress due to family member having liver surgery and being a constant caretaker.

## 2015-01-03 NOTE — ED Notes (Signed)
PA at bedside.

## 2015-01-03 NOTE — ED Provider Notes (Signed)
[name] [name]     Arrival date & time 01/03/15  7829 History   First MD Initiated Contact with Patient 01/03/15 0919     Chief Complaint  Patient presents with  . Melena  . Nausea   [name] [name] is a 59 y.o. female with a history of Mnire's disease, hypertension, GERD, previous hysterectomy presents to the emergency department complaining of black tarry stools ongoing for the past 3 weeks associated with lower abdominal pain. Patient also complains of worsening heartburn for the past year. She complains of intermittent nausea and vomiting. She reports she last vomited 2 days ago without hematemesis. Patient reports she does take Goody powders intermittently but denies any other NSAID use. She does report using Pepto bismol. Patient also reports she has chest pain intermittently worse with her acid reflux ongoing for the past 3 weeks without shortness of breath or palpitations. She reports taking omeprazole 20 mg once a day. Patient also complains of positional lightheadedness ongoing for the past year and worse in the past several weeks. Patient currently complains of 3 out of 10 left lower quadrant abdominal pain that she describes as shooting and intermittent. The patient's last bowel movement was 3 days ago and was black and tarry. The patient denies fevers, chills, hematemesis, urinary symptoms, shortness of breath, palpitations, or rashes.   (Consider location/radiation/quality/duration/timing/severity/associated sxs/prior Treatment) HPI  Past Medical History  Diagnosis Date  . Hyperlipidemia   . Hypertension   . Allergy     seasonal  . GERD (gastroesophageal reflux disease)   . Sleep apnea   . PPD positive 2007 or 2008  . Urinary incontinence   . Memory loss     short term  . Depression     sees the [facility]    Past Surgical History  Procedure Laterality Date  . Abdominal hysterectomy    . Lumbar disc surgery  2003    Dr. Wynetta Emery  . Anterior fusion cervical spine   2006    Dr. Wynetta Emery  . Uvulopalatopharyngoplasty    . Esophagogastroduodenoscopy  04-23-11    per Dr. Loreta Ave, normal   . Colonoscopy  04-23-11    per Dr. Loreta Ave, adenomatous polyps, repeat in 5 yrs   . Vaginal hysterectomy  12/96    secondary to fibroids.  . Breast biopsy Right 1981    benign  . Breast surgery Bilateral 12-11-12    reductions per Dr. Ivin Booty in [facility]    Family History  Problem Relation Age of Onset  . Dementia Mother   . Hyperlipidemia Mother   . Alcohol abuse      fhx  . Arthritis      fhx  . Coronary artery disease      fhx  . Depression      fhx  . Diabetes      fhx  . Hyperlipidemia      fhx  . Hypertension      fhx  . Sudden death      fhx  . Asthma Father   . Hypertension Sister   . Hyperlipidemia Sister   . Coronary artery disease Sister   . Thyroid disease Sister   . Hypertension Brother   . Hyperlipidemia Brother   . Coronary artery disease Brother   . Hypertension Sister   . Hyperlipidemia Sister    History  Substance Use Topics  . Smoking status: Current Every Day Smoker -- 0.50 packs/day for 30 years    Types: Cigarettes  .  Smokeless tobacco: Never Used     [name]: 5 cigarettes per day  . Alcohol Use: No   OB History    Gravida Para Term Preterm AB TAB SAB Ectopic Multiple Living   Review of Systems  Constitutional: Negative for fever and chills.  HENT: Negative for congestion and sore throat.   Eyes: Negative for visual disturbance.  Respiratory: Negative for cough and shortness of breath.   Cardiovascular: Positive for chest pain. Negative for palpitations.  Gastrointestinal: Positive for nausea, abdominal pain and blood in stool. Negative for vomiting and diarrhea.  Genitourinary: Negative for dysuria, urgency, frequency and difficulty urinating.  Musculoskeletal: Positive for back pain (chronic ). Negative for neck pain.  Skin: Negative for rash.  Neurological: Positive for light-headedness.  Negative for syncope, weakness, numbness and headaches.      Allergies  Sulfonamide derivatives  Home Medications   Prior to Admission medications   Medication Sig Start Date End Date Taking? Authorizing Provider  acetaminophen (TYLENOL) 325 MG tablet Take 650 mg by mouth every 6 (six) hours as needed for mild pain.   Yes Historical Provider, MD  acyclovir (ZOVIRAX) 400 MG tablet TAKE 1 TABLET BY MOUTH FOUR TIMES A DAY AS NEEDED 12/06/14  Yes [name] Salisbury, MD  alprazolam Prudy Feeler) 2 MG tablet Take 1 tablet (2 mg total) by mouth 3 (three) times daily as needed for anxiety. 07/15/14  Yes [name] Salisbury, MD  Aspirin-Acetaminophen (GOODY BODY PAIN) 500-325 MG PACK Take 1 packet by mouth every 8 (eight) hours as needed (pain).   Yes Historical Provider, MD  Cyanocobalamin (B-12) 2500 MCG TABS Take 1 tablet by mouth daily.   Yes Historical Provider, MD  ergocalciferol (VITAMIN D2) 50000 UNITS capsule Take 1 capsule (50,000 Units total) by mouth once a week. Once a week 09/10/12  Yes [name] Comment, FNP  HYDROcodone-acetaminophen [facility]) 10-325 MG per tablet Take 1 tablet by mouth every 6 (six) hours as needed. Patient taking differently: Take 1 tablet by mouth every 6 (six) hours as needed for moderate pain.  11/26/14  Yes [name] Salisbury, MD  lamoTRIgine (LAMICTAL) 100 MG tablet TAKE 1 TABLET BY MOUTH 2 TIMES A DAY. 11/25/13  Yes [name] Salisbury, MD  Multiple Vitamin (MULTIVITAMIN) tablet Take 1 tablet by mouth daily.     Yes Historical Provider, MD  OVER THE COUNTER MEDICATION Take 3 capsules by mouth every evening. "Swiss Kriss" Herbal Laxative   Yes Historical Provider, MD  Probiotic Product (FLORAJEN3 PO) Take 1 tablet by mouth daily.   Yes Historical Provider, MD  QUEtiapine (SEROQUEL) 50 MG tablet Take 1 tablet (50 mg total) by mouth at bedtime. 07/05/14  Yes [name] Salisbury, MD  simvastatin (ZOCOR) 40 MG tablet TAKE 1 TABLET BY MOUTH EVERY NIGHT AT BEDTIME 09/21/14  Yes [name] Salisbury, MD  conjugated  estrogens (PREMARIN) vaginal cream Place 1 Applicatorful vaginally daily. Use 1/2 g vaginally twice weekly Patient not taking: Reported on 06/02/2014 06/23/13   [name] Comment, FNP  omeprazole (PRILOSEC) 20 MG capsule Take 1 capsule (20 mg total) by mouth 2 (two) times daily before a meal. 01/03/15   [name] Farrier, PA-C  polyethylene glycol powder (GLYCOLAX/MIRALAX) powder Take 17 g by mouth 2 (two) times daily. Until daily soft stools  OTC 01/03/15   [name] Farrier, PA-C  QUEtiapine (SEROQUEL) 50 MG tablet TAKE 1 TABLET BY MOUTH AT BEDTIME. Patient not taking: Reported on  01/03/2015 10/05/14   [name] SalisburyStephen A Fry, MD  sucralfate (CARAFATE) 1 G tablet Take 1 tablet (1 g total) by mouth daily. At noon. 01/03/15   [name] FarrierWilliam Rozanna Cormany, PA-C  varenicline (CHANTIX CONTINUING MONTH PAK) 1 MG tablet Take 1 tablet (1 mg total) by mouth 2 (two) times daily. Patient not taking: Reported on 06/02/2014 08/18/13   [name] SalisburyStephen A Fry, MD  varenicline (CHANTIX STARTING MONTH PAK) 0.5 MG X 11 & 1 MG X 42 tablet Take one 0.5 mg tablet by mouth once daily for 3 days, then increase to one 0.5 mg tablet twice daily for 4 days, then increase to one 1 mg tablet twice daily. Patient not taking: Reported on 06/02/2014 08/18/13   [name] SalisburyStephen A Fry, MD   BP 105/59 mmHg  Pulse 67  Temp(Src) 97.7 F (36.5 C) (Oral)  Resp 16  Ht 5\' 2"  (1.575 m)  Wt 170 lb 2 oz (77.168 kg)  BMI 31.11 kg/m2  SpO2 99% Physical Exam  Constitutional: She is oriented to person, place, and time. She appears well-developed and well-nourished. No distress.  Nontoxic appearing.  HENT:  Head: Normocephalic and atraumatic.  Mouth/Throat: Oropharynx is clear and moist. No oropharyngeal exudate.  Eyes: Conjunctivae are normal. Pupils are equal, round, and reactive to light. Right eye exhibits no discharge. Left eye exhibits no discharge.  Neck: Neck supple. No JVD present. No tracheal deviation present.  Cardiovascular: Normal rate, regular rhythm, normal heart sounds and  intact distal pulses.  Exam reveals no gallop and no friction rub.   No murmur heard. Pulmonary/Chest: Effort normal and breath sounds normal. No respiratory distress. She has no wheezes. She has no rales.  Lungs are clear to auscultation bilaterally.  Abdominal: Soft. Bowel sounds are normal. She exhibits no distension and no mass. There is tenderness. There is no rebound and no guarding.  Abdomen soft. Patient has mild epigastric and left lower quadrant tenderness to palpation. Negative psoas and obturator sign.   Genitourinary: Guaiac negative stool.  Digital rectal exam performed by me with female RN chaperone. There is a small external hemorrhoid present. Brown stool noted. Anal sphincter tone is normal. Hemoccult is negative.  Musculoskeletal: She exhibits no edema or tenderness.  No lower extremity edema or tenderness.  Lymphadenopathy:    She has no cervical adenopathy.  Neurological: She is alert and oriented to person, place, and time. Coordination normal.  Skin: Skin is warm and dry. No rash noted. She is not diaphoretic. No erythema. No pallor.  Psychiatric: She has a normal mood and affect. Her behavior is normal.  Nursing note and vitals reviewed.   ED Course  Procedures (including critical care time) Labs Review Labs Reviewed  COMPREHENSIVE METABOLIC PANEL - Abnormal; Notable for the following:    Glucose, Bld 128 (*)    All other components within normal limits  LIPASE, BLOOD  CBC WITH DIFFERENTIAL/PLATELET  URINALYSIS, ROUTINE W REFLEX MICROSCOPIC ([facility])  OCCULT BLOOD X 1 CARD TO LAB, STOOL  POC OCCULT BLOOD, ED    Imaging Review No results found.   EKG Interpretation   Date/Time:  Monday January 03 2015 09:49:19 EDT Ventricular Rate:  65 PR Interval:  168 QRS Duration: 101 QT Interval:  428 QTC Calculation: 445 R Axis:   80 Text Interpretation:  Sinus rhythm Normal ECG Confirmed by BEATON  MD,  ROBERT ([id number]) on 01/03/2015 9:54:17 AM      Filed  Vitals:   01/03/15 1030 01/03/15 1100 01/03/15 1130 01/03/15 1200  BP:  107/70 105/75 103/72 105/59  Pulse: 72 66 65 67  Temp:      TempSrc:      Resp: 15 16 12 16   Height:      Weight:      SpO2: 100% 94% 94% 99%     MDM   Meds given in ED:  Medications  sodium chloride 0.9 % bolus 1,000 mL (0 mLs Intravenous Stopped 01/03/15 1050)  ondansetron (ZOFRAN) injection 4 mg (4 mg Intravenous Given 01/03/15 1007)  gi cocktail (Maalox,Lidocaine,Donnatal) (30 mLs Oral Given 01/03/15 1007)    New Prescriptions   OMEPRAZOLE (PRILOSEC) 20 MG CAPSULE    Take 1 capsule (20 mg total) by mouth 2 (two) times daily before a meal.   POLYETHYLENE GLYCOL POWDER (GLYCOLAX/MIRALAX) POWDER    Take 17 g by mouth 2 (two) times daily. Until daily soft stools  OTC   SUCRALFATE (CARAFATE) 1 G TABLET    Take 1 tablet (1 g total) by mouth daily. At noon.     Final diagnoses:  Gastroesophageal reflux disease, esophagitis presence not specified  Constipation, unspecified constipation type   This is a 59 y.o. female with a history of Mnire's disease, hypertension, GERD, previous hysterectomy presents to the emergency department complaining of black tarry stools ongoing for the past 3 weeks associated with lower abdominal pain. Patient also complains of worsening heartburn for the past year. She complains of intermittent nausea and vomiting. She reports she last vomited 2 days ago without hematemesis. Patient reports she does take Goody powders intermittently but denies any other NSAID use. She does report using Pepto bismol. Patient takes chronic narcotics and reports constipation because of this. On exam patient is afebrile and nontoxic appearing. Patient's abdomen is soft and she has mild epigastric and left lower quadrant tenderness to palpation. No peritoneal signs. Her lungs are clear to auscultation bilaterally. Patient's urinalysis is unremarkable. She has a negative guaiac Hemoccult. On exam she had brown  stool. CMP is unremarkable. Lipase is normal at 22. CBC is within normal limits. She has a normal hemoglobin and hematocrit. Patient provided with a GI cocktail in the ED. At reevaluation she reports her symptoms have completely resolved. She denies any abdominal pain or chest pain. She denies any nausea, lightheadedness or dizziness. Patient reports taking omeprazole 20 mg once a day. Will increase her omeprazole 20 mg twice a day. Will add Carafate once daily. Patient also is constipated due to her chronic narcotic use. Patient given a prescription for MiraLAX. I also provided education on diet for acid reflux. I also advised the patient to follow-up with her primary care provider and with a gastroenterologist for her ongoing acid reflux. I advised the patient to follow-up with their primary care provider this week. I advised the patient to return to the emergency department with new or worsening symptoms or new concerns. The patient verbalized understanding and agreement with plan.    This patient was discussed with Dr. Radford Pax who agrees with assessment and plan.   [name] Farrier, PA-C 01/03/15 1222  [name] Nay, MD 01/08/15 2228

## 2015-01-11 ENCOUNTER — Other Ambulatory Visit: Payer: Self-pay | Admitting: Obstetrics & Gynecology

## 2015-01-11 ENCOUNTER — Ambulatory Visit
Admission: RE | Admit: 2015-01-11 | Discharge: 2015-01-11 | Disposition: A | Discharge status: Home / Self Care | Payer: PPO | Source: Ambulatory Visit | Attending: Obstetrics & Gynecology | Admitting: Obstetrics & Gynecology

## 2015-01-11 DIAGNOSIS — N63 Unspecified lump in unspecified breast: Secondary | ICD-10-CM

## 2015-01-19 ENCOUNTER — Ambulatory Visit
Admission: RE | Admit: 2015-01-19 | Discharge: 2015-01-19 | Disposition: A | Discharge status: Home / Self Care | Payer: PPO | Source: Ambulatory Visit | Attending: Obstetrics & Gynecology | Admitting: Obstetrics & Gynecology

## 2015-01-19 ENCOUNTER — Other Ambulatory Visit: Payer: Self-pay | Admitting: Obstetrics & Gynecology

## 2015-01-19 ENCOUNTER — Encounter (HOSPITAL_COMMUNITY): Payer: Self-pay | Admitting: Family Medicine

## 2015-01-19 ENCOUNTER — Emergency Department (HOSPITAL_COMMUNITY)
Admission: EM | Admit: 2015-01-19 | Discharge: 2015-01-19 | Disposition: A | Discharge status: Home / Self Care | Payer: PPO | Attending: Emergency Medicine | Admitting: Emergency Medicine

## 2015-01-19 DIAGNOSIS — K219 Gastro-esophageal reflux disease without esophagitis: Secondary | ICD-10-CM | POA: Diagnosis not present

## 2015-01-19 DIAGNOSIS — Z793 Long term (current) use of hormonal contraceptives: Secondary | ICD-10-CM | POA: Diagnosis not present

## 2015-01-19 DIAGNOSIS — N63 Unspecified lump in unspecified breast: Secondary | ICD-10-CM

## 2015-01-19 DIAGNOSIS — M545 Low back pain, unspecified: Secondary | ICD-10-CM

## 2015-01-19 DIAGNOSIS — Z720 Tobacco use: Secondary | ICD-10-CM | POA: Insufficient documentation

## 2015-01-19 DIAGNOSIS — E785 Hyperlipidemia, unspecified: Secondary | ICD-10-CM | POA: Diagnosis not present

## 2015-01-19 DIAGNOSIS — Z79899 Other long term (current) drug therapy: Secondary | ICD-10-CM | POA: Diagnosis not present

## 2015-01-19 DIAGNOSIS — Z7982 Long term (current) use of aspirin: Secondary | ICD-10-CM | POA: Insufficient documentation

## 2015-01-19 DIAGNOSIS — F329 Major depressive disorder, single episode, unspecified: Secondary | ICD-10-CM | POA: Insufficient documentation

## 2015-01-19 DIAGNOSIS — I10 Essential (primary) hypertension: Secondary | ICD-10-CM | POA: Insufficient documentation

## 2015-01-19 DIAGNOSIS — Z8669 Personal history of other diseases of the nervous system and sense organs: Secondary | ICD-10-CM | POA: Diagnosis not present

## 2015-01-19 DIAGNOSIS — R52 Pain, unspecified: Secondary | ICD-10-CM

## 2015-01-19 MED ORDER — METHOCARBAMOL 1000 MG/10ML IJ SOLN
1000.0000 mg | Freq: Once | INTRAMUSCULAR | Status: DC
  Filled 2015-01-19: qty 10

## 2015-01-19 MED ORDER — METHOCARBAMOL 500 MG PO TABS: 1000.0000 mg | ORAL_TABLET | Freq: Four times a day (QID) | ORAL | Status: DC

## 2015-01-19 MED ORDER — OXYCODONE-ACETAMINOPHEN 5-325 MG PO TABS: ORAL_TABLET | ORAL | Status: DC

## 2015-01-19 MED ORDER — PREDNISONE 20 MG PO TABS
60.0000 mg | ORAL_TABLET | Freq: Once | ORAL | Status: AC
  Administered 2015-01-19: 60 mg via ORAL
  Filled 2015-01-19: qty 3

## 2015-01-19 MED ORDER — METHOCARBAMOL 500 MG PO TABS
1000.0000 mg | ORAL_TABLET | Freq: Once | ORAL | Status: AC
  Administered 2015-01-19: 1000 mg via ORAL
  Filled 2015-01-19 (×2): qty 2

## 2015-01-19 MED ORDER — MORPHINE SULFATE 4 MG/ML IJ SOLN
4.0000 mg | Freq: Once | INTRAMUSCULAR | Status: AC
  Administered 2015-01-19: 4 mg via INTRAMUSCULAR
  Filled 2015-01-19: qty 1

## 2015-01-19 MED ORDER — LIDOCAINE 5 % EX PTCH: 1.0000 | MEDICATED_PATCH | CUTANEOUS | Status: DC

## 2015-01-19 MED ORDER — OXYCODONE-ACETAMINOPHEN 5-325 MG PO TABS
2.0000 | ORAL_TABLET | Freq: Once | ORAL | Status: AC
  Administered 2015-01-19: 2 via ORAL
  Filled 2015-01-19: qty 2

## 2015-01-19 NOTE — ED Provider Notes (Signed)
History  This chart was scribed for non-physician practitioner, Monico Blitz, PA-C,working with Evelina Bucy, MD, by Marlowe Kays, ED Scribe. This patient was seen in room TR06C/TR06C and the patient's care was started at 2:56 PM.  Chief Complaint  Patient presents with  . Back Pain   The history is provided by the patient and medical records. No language interpreter was used.    HPI Comments:  FLORNCE RECORD is a 59 y.o. female with PMHx of chronic back pain who presents to the Emergency Department complaining of severe lower back pain that began two days ago. Her neurosurgeon is Dr. Saintclair Halsted and she states she has had lumbar disc surgery in the past. She states she called Dr. Windy Carina office and was told by the secretary to come to the ED. She reports taking Vicodin and Goody Powder with no significant relief of the pain. Any movement makes the pain worse. She denies alleviating factors. She denies fever, chills, nausea, vomiting, bowel or bladder incontinence, numbness, tingling or weakness of the lower extremities. Pt reports one episode of urinary incontinence in the past few days during her sleep. She reports urge incontinence normally at baseline but has never lost complete control before. Denies h/o cancer or DM. She denies trauma, injury or fall. Pt's PCP is Dr. Alysia Penna at [place].  Past Medical History  Diagnosis Date  . Hyperlipidemia   . Hypertension   . Allergy     seasonal  . GERD (gastroesophageal reflux disease)   . Sleep apnea   . PPD positive 2007 or 2008  . Urinary incontinence   . Memory loss     short term  . Depression     sees the [facility]    Past Surgical History  Procedure Laterality Date  . Abdominal hysterectomy    . Lumbar disc surgery  2003    Dr. Saintclair Halsted  . Anterior fusion cervical spine  2006    Dr. Saintclair Halsted  . Uvulopalatopharyngoplasty    . Esophagogastroduodenoscopy  04-23-11    per Dr. Collene Mares, normal   . Colonoscopy  04-23-11    per Dr.  Collene Mares, adenomatous polyps, repeat in 5 yrs   . Vaginal hysterectomy  12/96    secondary to fibroids.  . Breast biopsy Right 1981    benign  . Breast surgery Bilateral 12-11-12    reductions per Dr. Julious Oka in [facility]    Family History  Problem Relation Age of Onset  . Dementia Mother   . Hyperlipidemia Mother   . Alcohol abuse      fhx  . Arthritis      fhx  . Coronary artery disease      fhx  . Depression      fhx  . Diabetes      fhx  . Hyperlipidemia      fhx  . Hypertension      fhx  . Sudden death      fhx  . Asthma Father   . Hypertension Sister   . Hyperlipidemia Sister   . Coronary artery disease Sister   . Thyroid disease Sister   . Hypertension Brother   . Hyperlipidemia Brother   . Coronary artery disease Brother   . Hypertension Sister   . Hyperlipidemia Sister    History  Substance Use Topics  . Smoking status: Current Every Day Smoker -- 0.50 packs/day for 30 years    Types: Cigarettes  . Smokeless tobacco: Never Used  Comment: 5 cigarettes per day  . Alcohol Use: No   OB History    Gravida Para Term Preterm AB TAB SAB Ectopic Multiple Living   '2 2 2       2     '$ Review of Systems A complete 10 system review of systems was obtained and all systems are negative except as noted in the HPI and PMH.   Allergies  Sulfonamide derivatives  Home Medications   Prior to Admission medications   Medication Sig Start Date End Date Taking? Authorizing Provider  acetaminophen (TYLENOL) 325 MG tablet Take 650 mg by mouth every 6 (six) hours as needed for mild pain.    Historical Provider, MD  acyclovir (ZOVIRAX) 400 MG tablet TAKE 1 TABLET BY MOUTH FOUR TIMES A DAY AS NEEDED 12/06/14   Laurey Morale, MD  alprazolam [name] 2 MG tablet Take 1 tablet (2 mg total) by mouth 3 (three) times daily as needed for anxiety. 07/15/14   Laurey Morale, MD  Aspirin-Acetaminophen (GOODY BODY PAIN) 500-325 MG PACK Take 1 packet by mouth every 8 (eight) hours as  needed (pain).    Historical Provider, MD  conjugated estrogens (PREMARIN) vaginal cream Place 1 Applicatorful vaginally daily. Use 1/2 g vaginally twice weekly Patient not taking: Reported on 06/02/2014 06/23/13   Kem Boroughs, FNP  Cyanocobalamin (B-12) 2500 MCG TABS Take 1 tablet by mouth daily.    Historical Provider, MD  ergocalciferol (VITAMIN D2) 50000 UNITS capsule Take 1 capsule (50,000 Units total) by mouth once a week. Once a week 09/10/12   Kem Boroughs, FNP  HYDROcodone-acetaminophen [facility]) 10-325 MG per tablet Take 1 tablet by mouth every 6 (six) hours as needed. Patient taking differently: Take 1 tablet by mouth every 6 (six) hours as needed for moderate pain.  11/26/14   Laurey Morale, MD  lamoTRIgine (LAMICTAL) 100 MG tablet TAKE 1 TABLET BY MOUTH 2 TIMES A DAY. 11/25/13   Laurey Morale, MD  Multiple Vitamin (MULTIVITAMIN) tablet Take 1 tablet by mouth daily.      Historical Provider, MD  omeprazole (PRILOSEC) 20 MG capsule Take 1 capsule (20 mg total) by mouth 2 (two) times daily before a meal. 01/03/15   Waynetta Pean, PA-C  OVER THE COUNTER MEDICATION Take 3 capsules by mouth every evening. "Swiss Kriss" Herbal Laxative    Historical Provider, MD  polyethylene glycol powder (GLYCOLAX/MIRALAX) powder Take 17 g by mouth 2 (two) times daily. Until daily soft stools  OTC 01/03/15   Waynetta Pean, PA-C  Probiotic Product (FLORAJEN3 PO) Take 1 tablet by mouth daily.    Historical Provider, MD  QUEtiapine (SEROQUEL) 50 MG tablet Take 1 tablet (50 mg total) by mouth at bedtime. 07/05/14   Laurey Morale, MD  QUEtiapine (SEROQUEL) 50 MG tablet TAKE 1 TABLET BY MOUTH AT BEDTIME. Patient not taking: Reported on 01/03/2015 10/05/14   Laurey Morale, MD  simvastatin (ZOCOR) 40 MG tablet TAKE 1 TABLET BY MOUTH EVERY NIGHT AT BEDTIME 09/21/14   Laurey Morale, MD  sucralfate (CARAFATE) 1 G tablet Take 1 tablet (1 g total) by mouth daily. At noon. 01/03/15   Waynetta Pean, PA-C  varenicline (CHANTIX  CONTINUING MONTH PAK) 1 MG tablet Take 1 tablet (1 mg total) by mouth 2 (two) times daily. Patient not taking: Reported on 06/02/2014 08/18/13   Laurey Morale, MD  varenicline (CHANTIX STARTING MONTH PAK) 0.5 MG X 11 & 1 MG X 42 tablet Take one 0.5 mg  tablet by mouth once daily for 3 days, then increase to one 0.5 mg tablet twice daily for 4 days, then increase to one 1 mg tablet twice daily. Patient not taking: Reported on 06/02/2014 08/18/13   Laurey Morale, MD   Triage Vitals: BP 128/63 mmHg  Pulse 69  Temp(Src) 97.7 F (36.5 C) (Oral)  Resp 18  SpO2 99% Physical Exam  Constitutional: She is oriented to person, place, and time. She appears well-developed and well-nourished.  HENT:  Head: Normocephalic and atraumatic.  Eyes: Conjunctivae and EOM are normal.  Neck: Normal range of motion.  Cardiovascular: Normal rate, regular rhythm and intact distal pulses.   Pulmonary/Chest: Effort normal.  Abdominal: Soft. There is no tenderness.  Musculoskeletal: Normal range of motion.  Neurological: She is alert and oriented to person, place, and time.  No point tenderness to percussion of lumbar spinal processes.  No TTP or paraspinal muscular spasm. Strength is 5 out of 5 to bilateral lower extremities at hip and knee; extensor hallucis longus 5 out of 5. Ankle strength 5 out of 5, no clonus, neurovascularly intact. No saddle anaesthesia. Patellar reflexes are 2+ bilaterally.   Normal rectal tone with no increase in tone with sphincter contraction.      Skin: Skin is warm and dry.  Psychiatric: She has a normal mood and affect. Her behavior is normal.  Nursing note and vitals reviewed.   ED Course  Procedures (including critical care time) DIAGNOSTIC STUDIES: Oxygen Saturation is 99% on RA, normal by my interpretation.   COORDINATION OF CARE: 3:10 PM- Informed pt that X-Rays are not indicated at the time. Will order MRI secondary to poor rectal tone. Will order pain medication prior to  imaging. Pt verbalizes understanding and agrees to plan.  5:25 PM- Pt decided to follow up with Dr. Saintclair Halsted and get MRI as outpatient. Will prescribe Lidoderm patches and will give Percocet prior to discharge. Pt reports having a bleeding ulcer so advised her to not take Goody's Powder.    Medications - No data to display  Labs Review Labs Reviewed - No data to display  Imaging Review Mm Digital Diagnostic Unilat R  01/19/2015   CLINICAL DATA:  Status post ultrasound-guided core biopsy of right breast mass.  EXAM: DIAGNOSTIC RIGHT MAMMOGRAM POST ULTRASOUND BIOPSY  COMPARISON:  Previous exam(s).  FINDINGS: Mammographic images were obtained following ultrasound guided biopsy of a mass in the 7 o'clock region of the right breast. Mammographic images show there is a ribbon shaped clip in the 7 o'clock subareolar region of the right breast.  IMPRESSION: Status post ultrasound-guided core biopsy of the right breast with pathology pending.  Final Assessment: Post Procedure Mammograms for Marker Placement   Electronically Signed   By: Lillia Mountain M.D.   On: 01/19/2015 14:04   [place] Rt Breast Bx W Loc Dev 1st Lesion Img Bx Spec US Guide  01/19/2015   CLINICAL DATA:  Right breast mass.  EXAM: ULTRASOUND GUIDED RIGHT BREAST CORE NEEDLE BIOPSY  COMPARISON:  Previous exam(s).  PROCEDURE: I met with the patient and we discussed the procedure of ultrasound-guided biopsy, including benefits and alternatives. We discussed the high likelihood of a successful procedure. We discussed the risks of the procedure including infection, bleeding, tissue injury, clip migration, and inadequate sampling. Informed written consent was given. The usual time-out protocol was performed immediately prior to the procedure.  Using sterile technique and 2% Lidocaine as local anesthetic, under direct ultrasound visualization, a 12 gauge vacuum-assisted device  was used to perform biopsy of a mass in the 7 o'clock region the right breastusing an  inferior to superior approach. At the conclusion of the procedure, a ribbon shaped tissue marker clip was deployed into the biopsy cavity. Follow-up 2-view mammogram was performed and dictated separately.  IMPRESSION: Ultrasound-guided biopsy of the right breast. No apparent complications.   Electronically Signed   By: Lillia Mountain M.D.   On: 01/19/2015 13:37     EKG Interpretation None      MDM   Final diagnoses:  Pain  Bilateral low back pain without sciatica    Filed Vitals:   01/19/15 1435 01/19/15 1731  BP: 128/63 149/84  Pulse: 69 75  Temp: 97.7 F (36.5 C)   TempSrc: Oral   Resp: 18 18  SpO2: 99% 97%    Medications  predniSONE (DELTASONE) tablet 60 mg (60 mg Oral Given 01/19/15 1525)  morphine 4 MG/ML injection 4 mg (4 mg Intramuscular Given 01/19/15 1525)  methocarbamol (ROBAXIN) tablet 1,000 mg (1,000 mg Oral Given 01/19/15 1553)  oxyCODONE-acetaminophen (PERCOCET/ROXICET) 5-325 MG per tablet 2 tablet (2 tablets Oral Given 01/19/15 1740)    [name] [name] is a pleasant 59 y.o. female presenting with  back pain. Has had several surgeries by neurosurgeon Dr. Saintclair Halsted. My neuro exam today is normal, she is ambulatory but with the severe pain. Patient has good rectal tone however when I asked her to bear down there was no increase in tone. Patient reports that she had an episode of urinary incontinence in her sleep in the last week. She does have urge incontinence but she states that she normally does not lose her urine overnight. Out of an abundance of caution MRI is ordered but I doubt she has cauda equina or any pathology that would require emergent intervention.  States that she feels better with pain medication, patient has been waiting in the ED for the MRI and she wishes to leave, I think this is reasonable, vascular to follow closely with her neurosurgeon.   Evaluation does not show pathology that would require ongoing emergent intervention or inpatient treatment. Pt is  hemodynamically stable and mentating appropriately. Discussed findings and plan with patient/guardian, who agrees with care plan. All questions answered. Return precautions discussed and outpatient follow up given.   Discharge Medication List as of 01/19/2015  5:29 PM    START taking these medications   Details  lidocaine (LIDODERM) 5 % Place 1 patch onto the skin daily. Remove & Discard patch within 12 hours or as directed by MD, Starting 01/19/2015, Until Discontinued, Print    methocarbamol (ROBAXIN) 500 MG tablet Take 2 tablets (1,000 mg total) by mouth 4 (four) times daily., Starting 01/19/2015, Until Discontinued, Print    oxyCODONE-acetaminophen (PERCOCET/ROXICET) 5-325 MG per tablet 1 to 2 tabs PO q6hrs  PRN for pain, Print         I personally performed the services described in this documentation, which was scribed in my presence. The recorded information has been reviewed and is accurate.    Monico Blitz, PA-C 01/19/15 2215  Evelina Bucy, MD 01/21/15 [phone]

## 2015-01-19 NOTE — ED Notes (Signed)
Pt here for back pain that started a few weeks ago and worsening the last few days. Pt has chronic back issues. Denies injury

## 2015-01-19 NOTE — Discharge Instructions (Signed)
Take robaxin and/or percocet for breakthrough pain, do not drink alcohol, drive, care for children or perfom other critical tasks while taking robaxin and/or percocet.  Please follow with your primary care doctor in the next 2 days for a check-up. They must obtain records for further management.   Do not hesitate to return to the Emergency Department for any new, worsening or concerning symptoms.    Back Pain, Adult Low back pain is very common. About 1 in 5 people have back pain.The cause of low back pain is rarely dangerous. The pain often gets better over time.About half of people with a sudden onset of back pain feel better in just 2 weeks. About 8 in 10 people feel better by 6 weeks.  CAUSES Some common causes of back pain include:  Strain of the muscles or ligaments supporting the spine.  Wear and tear (degeneration) of the spinal discs.  Arthritis.  Direct injury to the back. DIAGNOSIS Most of the time, the direct cause of low back pain is not known.However, back pain can be treated effectively even when the exact cause of the pain is unknown.Answering your caregiver's questions about your overall health and symptoms is one of the most accurate ways to make sure the cause of your pain is not dangerous. If your caregiver needs more information, he or she may order lab work or imaging tests (X-rays or MRIs).However, even if imaging tests show changes in your back, this usually does not require surgery. HOME CARE INSTRUCTIONS For many people, back pain returns.Since low back pain is rarely dangerous, it is often a condition that people can learn to [facility] their own.   Remain active. It is stressful on the back to sit or stand in one place. Do not sit, drive, or stand in one place for more than 30 minutes at a time. Take short walks on level surfaces as soon as pain allows.Try to increase the length of time you walk each day.  Do not stay in bed.Resting more than 1 or 2 days  can delay your recovery.  Do not avoid exercise or work.Your body is made to move.It is not dangerous to be active, even though your back may hurt.Your back will likely heal faster if you return to being active before your pain is gone.  Pay attention to your body when you bend and lift. Many people have less discomfortwhen lifting if they bend their knees, keep the load close to their bodies,and avoid twisting. Often, the most comfortable positions are those that put less stress on your recovering back.  Find a comfortable position to sleep. Use a firm mattress and lie on your side with your knees slightly bent. If you lie on your back, put a pillow under your knees.  Only take over-the-counter or prescription medicines as directed by your caregiver. Over-the-counter medicines to reduce pain and inflammation are often the most helpful.Your caregiver may prescribe muscle relaxant drugs.These medicines help dull your pain so you can more quickly return to your normal activities and healthy exercise.  Put ice on the injured area.  Put ice in a plastic bag.  Place a towel between your skin and the bag.  Leave the ice on for 15-20 minutes, 03-04 times a day for the first 2 to 3 days. After that, ice and heat may be alternated to reduce pain and spasms.  Ask your caregiver about trying back exercises and gentle massage. This may be of some benefit.  Avoid feeling anxious or  increases muscle tension and can worsen back pain. It is important to recognize when you are anxious or stressed and learn ways to manage it. Exercise is a great option. °SEEK MEDICAL CARE IF: °· You have pain that is not relieved with rest or medicine. °· You have pain that does not improve in 1 week. °· You have new symptoms. °· You are generally not feeling well. °SEEK IMMEDIATE MEDICAL CARE IF:  °· You have pain that radiates from your back into your legs. °· You develop new bowel or bladder control  problems. °· You have unusual weakness or numbness in your arms or legs. °· You develop nausea or vomiting. °· You develop abdominal pain. °· You feel faint. °Document Released: 06/04/2005 Document Revised: 12/04/2011 Document Reviewed: 10/06/2013 °ExitCare® Patient Information ©2015 [employer]. This information is not intended to replace advice given to you by your health care provider. Make sure you discuss any questions you have with your health care provider. ° °

## 2015-01-19 NOTE — ED Notes (Signed)
Pt ambulated to restroom with no acute distress noted, pt with steady gait.

## 2015-02-07 ENCOUNTER — Other Ambulatory Visit: Payer: Self-pay | Admitting: Family Medicine

## 2015-02-08 NOTE — Telephone Encounter (Signed)
Call in #90 with 5 rf 

## 2015-02-11 ENCOUNTER — Telehealth: Payer: Self-pay | Admitting: *Deleted

## 2015-02-11 NOTE — Telephone Encounter (Signed)
Script was called in on 02/10/15 and I spoke with pt.

## 2015-02-11 NOTE — Telephone Encounter (Signed)
Patient requesting refill on Alprazolam 2 mg tablet to be sent to [facility] [phone] if appropriate

## 2015-02-16 ENCOUNTER — Other Ambulatory Visit: Payer: Self-pay | Admitting: Dermatology

## 2015-02-23 ENCOUNTER — Telehealth: Payer: Self-pay

## 2015-02-23 NOTE — Telephone Encounter (Signed)
The pt is hoping to get a refill of her chantix rx  Pharmacy - [facility]  Pt callback - [phone]

## 2015-02-24 NOTE — Telephone Encounter (Signed)
Call in Chantix 1 mg bid for 3 months

## 2015-02-28 ENCOUNTER — Telehealth: Payer: Self-pay | Admitting: Family Medicine

## 2015-02-28 MED ORDER — VARENICLINE TARTRATE 1 MG PO TABS: 1.0000 mg | ORAL_TABLET | Freq: Two times a day (BID) | ORAL | Status: DC

## 2015-02-28 NOTE — Telephone Encounter (Signed)
I sent script e-scribe, tried to reach pt and no answer. 

## 2015-02-28 NOTE — Telephone Encounter (Signed)
Opened in error. Pt made appt.

## 2015-03-02 ENCOUNTER — Ambulatory Visit (INDEPENDENT_AMBULATORY_CARE_PROVIDER_SITE_OTHER): Payer: PPO | Admitting: Family Medicine

## 2015-03-02 ENCOUNTER — Encounter: Payer: Self-pay | Admitting: Family Medicine

## 2015-03-02 VITALS — BP 135/81 | HR 69 | Temp 98.0°F | Ht 62.0 in | Wt 175.0 lb

## 2015-03-02 DIAGNOSIS — Z23 Encounter for immunization: Secondary | ICD-10-CM | POA: Diagnosis not present

## 2015-03-02 DIAGNOSIS — R2689 Other abnormalities of gait and mobility: Secondary | ICD-10-CM

## 2015-03-02 DIAGNOSIS — I10 Essential (primary) hypertension: Secondary | ICD-10-CM

## 2015-03-02 DIAGNOSIS — R29818 Other symptoms and signs involving the nervous system: Secondary | ICD-10-CM | POA: Diagnosis not present

## 2015-03-02 DIAGNOSIS — M5440 Lumbago with sciatica, unspecified side: Secondary | ICD-10-CM | POA: Diagnosis not present

## 2015-03-02 MED ORDER — OXYCODONE HCL 10 MG PO TABS: 10.0000 mg | ORAL_TABLET | Freq: Four times a day (QID) | ORAL | Status: DC | PRN

## 2015-03-02 NOTE — Progress Notes (Signed)
   Subjective:    Patient ID: [name] [name], female    DOB: 02-23-1956, 59 y.o.   MRN: [id number]  HPI Here for several issues. First her low back pain has been getting worse over the past few months. She has been taking 2 hydrocodones at a time for the pain and she has been supplementing this with Goody powders, up to 6 a day. Also her dizziness and balance problems have been getting worse, and she finds herself having to hold onto the walls at times to keep from falling. She has been seeing Dr. Jac Canavan for Menieres disease, but nothing has been helping. She has tried vestibular rehab several times with poor results.    Review of Systems  Constitutional: Negative.   Respiratory: Negative.   Cardiovascular: Negative.   Musculoskeletal: Positive for back pain and gait problem.  Neurological: Positive for dizziness. Negative for tremors, seizures, syncope, facial asymmetry, speech difficulty, weakness, light-headedness, numbness and headaches.       Objective:   Physical Exam  Constitutional: She is oriented to person, place, and time. She appears well-developed and well-nourished.  Cardiovascular: Normal rate, regular rhythm, normal heart sounds and intact distal pulses.   Pulmonary/Chest: Effort normal and breath sounds normal.  Neurological: She is alert and oriented to person, place, and time. No cranial nerve deficit. Coordination normal.          Assessment & Plan:  For the back pain, we will stop Hydrocodone and try Oxycodone 10 mg QID. I advised her to stop using any NDAIDs, including Goody powders. We will refer her to the [facility].

## 2015-03-02 NOTE — Progress Notes (Signed)
Pre visit review using our clinic review tool, if applicable. No additional management support is needed unless otherwise documented below in the visit note. 

## 2015-03-02 NOTE — Addendum Note (Signed)
Addended by: Aniceto Boss A on: 03/02/2015 02:42 PM   Modules accepted: Orders

## 2015-03-03 ENCOUNTER — Telehealth: Payer: Self-pay | Admitting: Family Medicine

## 2015-03-03 NOTE — Telephone Encounter (Addendum)
This is the doctor they would like for pt's referral for unsteadiness: Mcelveen, Lavinia Sharps MD  [facility] Address: [address] [place] Oka [place], [place] [address]  Phone: [phone]    Fax [phone]  Husband states it would help if dr Haroldine Laws could be involved Dr Haroldine Laws has the vestibular testing notes

## 2015-03-07 ENCOUNTER — Telehealth: Payer: Self-pay | Admitting: Family Medicine

## 2015-03-07 NOTE — Telephone Encounter (Signed)
Yes, try taking 2 at a time and let me know how this works

## 2015-03-07 NOTE — Telephone Encounter (Signed)
Pt call to say that the following med is not working Oxycodone HCl 10 MG TABS. She is asking if she can take 2 instead of one

## 2015-03-09 NOTE — Telephone Encounter (Signed)
I spoke with pt and updated medication list. 

## 2015-03-21 DIAGNOSIS — Z0279 Encounter for issue of other medical certificate: Secondary | ICD-10-CM

## 2015-03-24 ENCOUNTER — Other Ambulatory Visit: Payer: Self-pay | Admitting: Family Medicine

## 2015-03-29 ENCOUNTER — Telehealth: Payer: Self-pay | Admitting: Family Medicine

## 2015-03-29 NOTE — Telephone Encounter (Addendum)
Pt request refill Oxycodone HCl 10 MG TABS Pt states she dr fry advised ok to take 2 because one just was not getting it   Pt was told to remind when called .

## 2015-03-30 ENCOUNTER — Telehealth: Payer: Self-pay | Admitting: Family Medicine

## 2015-03-30 MED ORDER — OXYCODONE HCL 10 MG PO TABS: 20.0000 mg | ORAL_TABLET | Freq: Four times a day (QID) | ORAL | Status: DC | PRN

## 2015-03-30 NOTE — Telephone Encounter (Signed)
Patient picked up #180 Oxycodone 10mg  on her insurance and paid out of pocked for the remaninig #60. Next month she will need her Rx to be written for the 20 mg tablets instead of the 10 mg tablets.

## 2015-03-30 NOTE — Telephone Encounter (Signed)
Script is ready for pick up and I spoke with pt.  

## 2015-03-30 NOTE — Telephone Encounter (Signed)
done

## 2015-03-31 NOTE — Telephone Encounter (Signed)
noted 

## 2015-05-23 ENCOUNTER — Other Ambulatory Visit: Payer: Self-pay

## 2015-05-23 DIAGNOSIS — Z9889 Other specified postprocedural states: Secondary | ICD-10-CM

## 2015-05-23 DIAGNOSIS — Z1231 Encounter for screening mammogram for malignant neoplasm of breast: Secondary | ICD-10-CM

## 2015-05-30 ENCOUNTER — Telehealth: Payer: Self-pay | Admitting: Family Medicine

## 2015-05-30 NOTE — Telephone Encounter (Signed)
Pt said the following med is a little to much for her Oxycodone HCl 10 MG TABS  And is asking if she can go back to the hydrocodone

## 2015-05-31 MED ORDER — HYDROCODONE-ACETAMINOPHEN 10-325 MG PO TABS: 1.0000 | ORAL_TABLET | Freq: Four times a day (QID) | ORAL | Status: DC | PRN

## 2015-05-31 NOTE — Telephone Encounter (Signed)
We will change to Norco, done

## 2015-06-01 NOTE — Telephone Encounter (Signed)
Script is ready for pick up and I spoke with pt.  

## 2015-07-07 ENCOUNTER — Telehealth: Payer: Self-pay | Admitting: Family Medicine

## 2015-07-07 DIAGNOSIS — Z6831 Body mass index (BMI) 31.0-31.9, adult: Secondary | ICD-10-CM | POA: Diagnosis not present

## 2015-07-07 DIAGNOSIS — Z01419 Encounter for gynecological examination (general) (routine) without abnormal findings: Secondary | ICD-10-CM | POA: Diagnosis not present

## 2015-07-07 DIAGNOSIS — N760 Acute vaginitis: Secondary | ICD-10-CM | POA: Diagnosis not present

## 2015-07-07 DIAGNOSIS — Z209 Contact with and (suspected) exposure to unspecified communicable disease: Secondary | ICD-10-CM

## 2015-07-07 MED ORDER — HYDROCODONE-ACETAMINOPHEN 10-325 MG PO TABS: 1.0000 | ORAL_TABLET | Freq: Four times a day (QID) | ORAL | Status: DC | PRN

## 2015-07-07 NOTE — Telephone Encounter (Signed)
Her Norco rx is ready. I put in an order for her to be tested for Hep C

## 2015-07-07 NOTE — Telephone Encounter (Signed)
Pt needs new rx hydrocodone °

## 2015-07-07 NOTE — Telephone Encounter (Signed)
Patient Name: [name] [name]  DOB: August 09, 1955    Initial Comment Caller states she was married to a man for 8 yrs that had hepatitis C - he had a liver transplant 2 yrs ago - Does she need to get tested?   Nurse Assessment  Nurse: Stefano Gaul, RN, Dwana Curd Date/Time (Eastern Time): 07/07/2015 10:37:31 AM  Confirm and document reason for call. If symptomatic, describe symptoms. You must click the next button to save text entered. ---Caller states she was married to a man for 8 years who has hepatitis C. No symptoms. He had a liver transplant 2 years. Had Hepatiis C test in 2002 which was negative. her annual physical is coming up soon and she wants to know if she should be retested.  Has the patient traveled out of the country within the last 30 days? ---Not Applicable  Does the patient have any new or worsening symptoms? ---No  Please document clinical information provided and list any resource used. ---Advised caller as per [email or web address] she should be retested for Hepatitis C and if she develops symptoms that are flu like to call back. Advised per nursing knowledge that note would be sent to PCP and she could be tested for Hepatitis C at her annual physical and to call back for any further needs. verbalized understanding.     Guidelines    Guideline Title Affirmed Question Affirmed Notes       Final Disposition User   Clinical Call Kimball, RN, Dwana Curd

## 2015-07-07 NOTE — Telephone Encounter (Signed)
FYI

## 2015-07-07 NOTE — Telephone Encounter (Signed)
Pt is aware. She would come today to pick it up

## 2015-07-07 NOTE — Telephone Encounter (Signed)
done

## 2015-07-15 ENCOUNTER — Ambulatory Visit
Admission: RE | Admit: 2015-07-15 | Discharge: 2015-07-15 | Disposition: A | Discharge status: Home / Self Care | Payer: PPO | Source: Ambulatory Visit

## 2015-07-15 DIAGNOSIS — Z1231 Encounter for screening mammogram for malignant neoplasm of breast: Secondary | ICD-10-CM

## 2015-07-15 DIAGNOSIS — Z9889 Other specified postprocedural states: Secondary | ICD-10-CM

## 2015-07-18 ENCOUNTER — Telehealth: Payer: Self-pay | Admitting: Family Medicine

## 2015-07-18 DIAGNOSIS — Z209 Contact with and (suspected) exposure to unspecified communicable disease: Secondary | ICD-10-CM

## 2015-07-18 NOTE — Telephone Encounter (Signed)
She already has an order for this in place

## 2015-07-18 NOTE — Telephone Encounter (Signed)
Pt would like to have hep c screening. Can I add to cpx labs?

## 2015-07-19 NOTE — Telephone Encounter (Signed)
I left a voice message with below information. 

## 2015-08-02 ENCOUNTER — Encounter: Payer: Self-pay | Admitting: Family Medicine

## 2015-08-02 ENCOUNTER — Ambulatory Visit (INDEPENDENT_AMBULATORY_CARE_PROVIDER_SITE_OTHER): Payer: PPO | Admitting: Family Medicine

## 2015-08-02 VITALS — BP 125/99 | HR 78 | Temp 98.0°F | Ht 62.0 in | Wt 170.0 lb

## 2015-08-02 DIAGNOSIS — J209 Acute bronchitis, unspecified: Secondary | ICD-10-CM | POA: Diagnosis not present

## 2015-08-02 MED ORDER — AZITHROMYCIN 250 MG PO TABS: ORAL_TABLET | ORAL | Status: DC

## 2015-08-02 MED ORDER — HYDROCODONE-HOMATROPINE 5-1.5 MG/5ML PO SYRP: 5.0000 mL | ORAL_SOLUTION | ORAL | Status: DC | PRN

## 2015-08-02 NOTE — Progress Notes (Signed)
Pre visit review using our clinic review tool, if applicable. No additional management support is needed unless otherwise documented below in the visit note. 

## 2015-08-02 NOTE — Progress Notes (Signed)
   Subjective:    Patient ID: [name] [name], female    DOB: April 09, 1956, 60 y.o.   MRN: [id number]  HPI Here for 3 weeks of chest congestion and coughing up yellow sputum. No fever. Her chest muscles hurt from the coughing.    Review of Systems  Constitutional: Negative.   HENT: Negative.   Eyes: Negative.   Respiratory: Positive for cough and chest tightness. Negative for shortness of breath and wheezing.   Cardiovascular: Negative.        Objective:   Physical Exam  Constitutional: She appears well-developed and well-nourished.  HENT:  Right Ear: External ear normal.  Left Ear: External ear normal.  Nose: Nose normal.  Mouth/Throat: Oropharynx is clear and moist.  Eyes: Conjunctivae are normal.  Neck: No thyromegaly present.  Cardiovascular: Normal rate, regular rhythm, normal heart sounds and intact distal pulses.   Pulmonary/Chest: Effort normal. No respiratory distress. She has no wheezes. She has no rales.  Scattered rhonchi   Lymphadenopathy:    She has no cervical adenopathy.          Assessment & Plan:  Bronchitis, treat with a Zpack.

## 2015-08-08 ENCOUNTER — Telehealth: Payer: Self-pay | Admitting: Family Medicine

## 2015-08-08 ENCOUNTER — Other Ambulatory Visit (INDEPENDENT_AMBULATORY_CARE_PROVIDER_SITE_OTHER): Payer: PPO

## 2015-08-08 DIAGNOSIS — R7989 Other specified abnormal findings of blood chemistry: Secondary | ICD-10-CM | POA: Diagnosis not present

## 2015-08-08 DIAGNOSIS — Z1159 Encounter for screening for other viral diseases: Secondary | ICD-10-CM

## 2015-08-08 DIAGNOSIS — Z0000 Encounter for general adult medical examination without abnormal findings: Secondary | ICD-10-CM | POA: Diagnosis not present

## 2015-08-08 DIAGNOSIS — Z209 Contact with and (suspected) exposure to unspecified communicable disease: Secondary | ICD-10-CM

## 2015-08-08 LAB — BASIC METABOLIC PANEL
BUN: 10 mg/dL (ref 6–23)
CO2: 32 mEq/L (ref 19–32)
Calcium: 9.7 mg/dL (ref 8.4–10.5)
Chloride: 102 mEq/L (ref 96–112)
Creatinine, Ser: 0.63 mg/dL (ref 0.40–1.20)
GFR: 102.4 mL/min (ref >60.00)
Glucose, Bld: 91 mg/dL (ref 70–99)
Potassium: 4.1 mEq/L (ref 3.5–5.1)
Sodium: 140 mEq/L (ref 135–145)

## 2015-08-08 LAB — CBC WITH DIFFERENTIAL/PLATELET
Basophils Absolute: 0 10*3/uL (ref 0.0–0.1)
Basophils Relative: 0.6 % (ref 0.0–3.0)
Eosinophils Absolute: 0.2 10*3/uL (ref 0.0–0.7)
Eosinophils Relative: 3.7 % (ref 0.0–5.0)
HCT: 36.8 % (ref 36.0–46.0)
Hemoglobin: 12.5 g/dL (ref 12.0–15.0)
Lymphocytes Relative: 32.6 % (ref 12.0–46.0)
Lymphs Abs: 2 10*3/uL (ref 0.7–4.0)
MCHC: 33.9 g/dL (ref 30.0–36.0)
MCV: 88.1 fl (ref 78.0–100.0)
Monocytes Absolute: 0.3 10*3/uL (ref 0.1–1.0)
Monocytes Relative: 4.9 % (ref 3.0–12.0)
Neutro Abs: 3.6 10*3/uL (ref 1.4–7.7)
Neutrophils Relative %: 58.2 % (ref 43.0–77.0)
Platelets: 175 10*3/uL (ref 150.0–400.0)
RBC: 4.19 Mil/uL (ref 3.87–5.11)
RDW: 13.6 % (ref 11.5–15.5)
WBC: 6.2 10*3/uL (ref 4.0–10.5)

## 2015-08-08 LAB — POC URINALSYSI DIPSTICK (AUTOMATED)
Bilirubin, UA: NEGATIVE
Blood, UA: NEGATIVE
Glucose, UA: NEGATIVE
Ketones, UA: NEGATIVE
Leukocytes, UA: NEGATIVE
Nitrite, UA: NEGATIVE
Protein, UA: NEGATIVE
Spec Grav, UA: 1.02
Urobilinogen, UA: 0.2
pH, UA: 7

## 2015-08-08 LAB — HEPATIC FUNCTION PANEL
ALT: 21 U/L (ref 0–35)
AST: 27 U/L (ref 0–37)
Albumin: 4.2 g/dL (ref 3.5–5.2)
Alkaline Phosphatase: 56 U/L (ref 39–117)
Bilirubin, Direct: 0 mg/dL (ref 0.0–0.3)
Total Bilirubin: 0.3 mg/dL (ref 0.2–1.2)
Total Protein: 7.6 g/dL (ref 6.0–8.3)

## 2015-08-08 LAB — LIPID PANEL
Cholesterol: 183 mg/dL (ref 0–200)
HDL: 42.3 mg/dL (ref >39.00)
NonHDL: 140.66
Total CHOL/HDL Ratio: 4
Triglycerides: 236 mg/dL — ABNORMAL HIGH (ref 0.0–149.0)
VLDL: 47.2 mg/dL — ABNORMAL HIGH (ref 0.0–40.0)

## 2015-08-08 LAB — TSH: TSH: 1.47 u[IU]/mL (ref 0.35–4.50)

## 2015-08-08 LAB — LDL CHOLESTEROL, DIRECT: Direct LDL: 99 mg/dL

## 2015-08-08 NOTE — Telephone Encounter (Signed)
° ° °  Pt request refill of the following: ° ° °HYDROcodone-acetaminophen (NORCO) 10-325 MG tablet ° ° °Phamacy: °

## 2015-08-09 LAB — HEPATITIS C ANTIBODY: HCV Ab: NEGATIVE

## 2015-08-09 MED ORDER — HYDROCODONE-ACETAMINOPHEN 10-325 MG PO TABS: 1.0000 | ORAL_TABLET | Freq: Four times a day (QID) | ORAL | Status: DC | PRN

## 2015-08-09 NOTE — Telephone Encounter (Signed)
done

## 2015-08-09 NOTE — Telephone Encounter (Signed)
Script is ready for pick up and I spoke with pt.  

## 2015-08-15 ENCOUNTER — Ambulatory Visit (INDEPENDENT_AMBULATORY_CARE_PROVIDER_SITE_OTHER): Payer: PPO | Admitting: Family Medicine

## 2015-08-15 ENCOUNTER — Encounter: Payer: Self-pay | Admitting: Family Medicine

## 2015-08-15 VITALS — BP 119/80 | HR 69 | Temp 98.3°F | Ht 62.0 in | Wt 172.0 lb

## 2015-08-15 DIAGNOSIS — Z0000 Encounter for general adult medical examination without abnormal findings: Secondary | ICD-10-CM

## 2015-08-15 MED ORDER — ALPRAZOLAM 2 MG PO TABS: ORAL_TABLET | ORAL | Status: DC

## 2015-08-15 MED ORDER — VARENICLINE TARTRATE 1 MG PO TABS: 1.0000 mg | ORAL_TABLET | Freq: Two times a day (BID) | ORAL | Status: DC

## 2015-08-15 NOTE — Progress Notes (Signed)
Pre visit review using our clinic review tool, if applicable. No additional management support is needed unless otherwise documented below in the visit note. 

## 2015-08-15 NOTE — Progress Notes (Signed)
   Subjective:    Patient ID: [name] [name], female    DOB: 03-09-1956, 60 y.o.   MRN: [id number]  HPI 60 yr old female for a cpx. She feels well and has no concerns. Her bronchitis has resolved, and she found that taking magnesium supplements takes care of her muscle cramps.    Review of Systems  Constitutional: Negative.   HENT: Negative.   Eyes: Negative.   Respiratory: Negative.   Cardiovascular: Negative.   Gastrointestinal: Negative.   Genitourinary: Negative for dysuria, urgency, frequency, hematuria, flank pain, decreased urine volume, enuresis, difficulty urinating, pelvic pain and dyspareunia.  Musculoskeletal: Negative.   Skin: Negative.   Neurological: Negative.   Psychiatric/Behavioral: Negative.        Objective:   Physical Exam  Constitutional: She is oriented to person, place, and time. She appears well-developed and well-nourished. No distress.  HENT:  Head: Normocephalic and atraumatic.  Right Ear: External ear normal.  Left Ear: External ear normal.  Nose: Nose normal.  Mouth/Throat: Oropharynx is clear and moist. No oropharyngeal exudate.  Eyes: Conjunctivae and EOM are normal. Pupils are equal, round, and reactive to light. No scleral icterus.  Neck: Normal range of motion. Neck supple. No JVD present. No thyromegaly present.  Cardiovascular: Normal rate, regular rhythm, normal heart sounds and intact distal pulses.  Exam reveals no gallop and no friction rub.   No murmur heard. Pulmonary/Chest: Effort normal and breath sounds normal. No respiratory distress. She has no wheezes. She has no rales. She exhibits no tenderness.  Abdominal: Soft. Bowel sounds are normal. She exhibits no distension and no mass. There is no tenderness. There is no rebound and no guarding.  Musculoskeletal: Normal range of motion. She exhibits no edema or tenderness.  Lymphadenopathy:    She has no cervical adenopathy.  Neurological: She is alert and oriented to person,  place, and time. She has normal reflexes. No cranial nerve deficit. She exhibits normal muscle tone. Coordination normal.  Skin: Skin is warm and dry. No rash noted. No erythema.  Psychiatric: She has a normal mood and affect. Her behavior is normal. Judgment and thought content normal.          Assessment & Plan:  Well exam. We discussed diet and exercise. She will be walking every day as part of the [employer] program.

## 2015-09-19 ENCOUNTER — Telehealth: Payer: Self-pay | Admitting: Family Medicine

## 2015-09-19 MED ORDER — HYDROCODONE-ACETAMINOPHEN 10-325 MG PO TABS: 1.0000 | ORAL_TABLET | Freq: Four times a day (QID) | ORAL | Status: DC | PRN

## 2015-09-19 NOTE — Telephone Encounter (Signed)
° ° °  Pt request refill of the following: ° ° °HYDROcodone-acetaminophen (NORCO) 10-325 MG tablet ° ° °Phamacy: °

## 2015-09-19 NOTE — Telephone Encounter (Signed)
done

## 2015-09-19 NOTE — Telephone Encounter (Signed)
Script is ready for pick up and I spoke with pt.  

## 2015-09-26 ENCOUNTER — Other Ambulatory Visit: Payer: Self-pay | Admitting: Family Medicine

## 2015-09-26 NOTE — Telephone Encounter (Signed)
Refill request for Omeprazole 20 mg take 1 po bid and send to [facility].

## 2015-09-27 MED ORDER — OMEPRAZOLE 20 MG PO CPDR: 20.0000 mg | DELAYED_RELEASE_CAPSULE | Freq: Two times a day (BID) | ORAL | Status: DC

## 2015-09-27 NOTE — Telephone Encounter (Signed)
I sent script e-scribe. 

## 2015-10-04 ENCOUNTER — Telehealth: Payer: Self-pay | Admitting: Family Medicine

## 2015-10-04 DIAGNOSIS — G4730 Sleep apnea, unspecified: Secondary | ICD-10-CM

## 2015-10-04 NOTE — Telephone Encounter (Signed)
Referral was done  

## 2015-10-04 NOTE — Telephone Encounter (Signed)
Pt would like a referral for a sleep study to Koren BoundYacoub Wesam MD Address: [address], [place], [place] [address]  Phone: [phone]  Pt states she spoke with Dr Clent RidgesFry about getting a new CPAP machine.  Pt's cpap is very old.

## 2015-10-05 NOTE — Telephone Encounter (Signed)
Did the referral to Dr. Mayford Knifeurner

## 2015-10-05 NOTE — Telephone Encounter (Signed)
Pt cannot see Dr Molli KnockYacoub until July. So pt has called around and found an md that can see her sooner. Would like a referral to the dr below:  Dr Armanda Magicraci Turner Address: [address], [place], [place] [address]  Phone: [phone] Fax  [phone]  Their office is requesting office notes, does pt need to make an appointment?  Husband would like you to call him.

## 2015-10-05 NOTE — Telephone Encounter (Signed)
Pt is aware.  

## 2015-10-05 NOTE — Telephone Encounter (Signed)
I left a voice message with below information. 

## 2015-10-18 ENCOUNTER — Telehealth: Payer: Self-pay | Admitting: Family Medicine

## 2015-10-18 MED ORDER — HYDROCODONE-ACETAMINOPHEN 10-325 MG PO TABS: 1.0000 | ORAL_TABLET | Freq: Four times a day (QID) | ORAL | Status: DC | PRN

## 2015-10-18 NOTE — Telephone Encounter (Signed)
Pt needs new rx hydrocodone °

## 2015-10-18 NOTE — Telephone Encounter (Signed)
done

## 2015-10-18 NOTE — Telephone Encounter (Signed)
Script is ready for pick up, tried to reach pt and no answer or option to leave a message.

## 2015-11-04 ENCOUNTER — Other Ambulatory Visit: Payer: Self-pay | Admitting: Family Medicine

## 2015-11-10 ENCOUNTER — Encounter: Payer: Self-pay | Admitting: Cardiology

## 2015-11-10 ENCOUNTER — Ambulatory Visit (INDEPENDENT_AMBULATORY_CARE_PROVIDER_SITE_OTHER): Payer: PPO | Admitting: Cardiology

## 2015-11-10 VITALS — BP 130/78 | HR 78 | Ht 62.0 in | Wt 166.1 lb

## 2015-11-10 DIAGNOSIS — E669 Obesity, unspecified: Secondary | ICD-10-CM

## 2015-11-10 DIAGNOSIS — I10 Essential (primary) hypertension: Secondary | ICD-10-CM | POA: Diagnosis not present

## 2015-11-10 DIAGNOSIS — G4730 Sleep apnea, unspecified: Secondary | ICD-10-CM

## 2015-11-10 HISTORY — DX: Obesity, unspecified: E66.9

## 2015-11-10 NOTE — Patient Instructions (Signed)
Medication Instructions:  Your physician recommends that you continue on your current medications as directed. Please refer to the Current Medication list given to you today.   Labwork: None  Testing/Procedures: Your physician has recommended that you have a sleep study. This test records several body functions during sleep, including: brain activity, eye movement, oxygen and carbon dioxide blood levels, heart rate and rhythm, breathing rate and rhythm, the flow of air through your mouth and nose, snoring, body muscle movements, and chest and belly movement.  Follow-Up: Your physician wants you to follow-up in: 1 year with Dr. Turner. You will receive a reminder letter in the mail two months in advance. If you don't receive a letter, please call our office to schedule the follow-up appointment.   Any Other Special Instructions Will Be Listed Below (If Applicable).     If you need a refill on your cardiac medications before your next appointment, please call your pharmacy.   

## 2015-11-10 NOTE — Progress Notes (Signed)
Cardiology Office Note    Date:  11/10/2015   ID:  [name] [name], DOB 10/14/1955, MRN [id number]  PCP:  Nelwyn Salisbury, MD  Cardiologist:  Armanda Magic, MD   Chief Complaint  Patient presents with  . Sleep Apnea  . Hypertension    History of Present Illness:  [name] [name] is a 60 y.o. female with a history of OSA on CPAP therapy who has not been followed by a sleep specialist.  She is in need of a new CPAP device and is referred by her PCP for evaluation.  She tolerates her CPAP device and feels the pressure is adequate.  She uses a nasal mask which she gets very frustrated with and it moves frequently and then blows into her eyes.  She does not use a chin strap.  She gets a lot of mouth dryness. She snores a lot with her current mask.  She wakes up with headaches and severe fatigue as well as significant daytime sleepiness.    Past Medical History  Diagnosis Date  . Hyperlipidemia   . Hypertension   . Allergy     seasonal  . GERD (gastroesophageal reflux disease)   . Sleep apnea     on CPAP therapy  . PPD positive 2007 or 2008  . Urinary incontinence   . Memory loss     short term  . Depression     sees the [facility]   . Obesity (BMI 30-39.9) 11/10/2015    Past Surgical History  Procedure Laterality Date  . Abdominal hysterectomy    . Lumbar disc surgery  2003    Dr. Wynetta Emery  . Anterior fusion cervical spine  2006    Dr. Wynetta Emery  . Uvulopalatopharyngoplasty    . Esophagogastroduodenoscopy  04-23-11    per Dr. Loreta Ave, normal   . Colonoscopy  04-23-11    per Dr. Loreta Ave, adenomatous polyps, repeat in 5 yrs   . Vaginal hysterectomy  12/96    secondary to fibroids.  . Breast biopsy Right 1981    benign  . Breast surgery Bilateral 12-11-12    reductions per Dr. Ivin Booty in [facility]     Current Medications: Outpatient Prescriptions Prior to Visit  Medication Sig Dispense Refill  . acyclovir (ZOVIRAX) 400 MG tablet TAKE 1 TABLET BY MOUTH FOUR TIMES A  DAY AS NEEDED 120 tablet 5  . alprazolam (XANAX) 2 MG tablet TAKE 1 TABLET BY MOUTH 3 TIMES A DAY AS NEEDED FOR ANXIETY 90 tablet 5  . HYDROcodone-acetaminophen (NORCO) 10-325 MG tablet Take 1 tablet by mouth every 6 (six) hours as needed for moderate pain. 120 tablet 0  . lamoTRIgine (LAMICTAL) 100 MG tablet TAKE 1 TABLET BY MOUTH 2 TIMES A DAY. 60 tablet 11  . Multiple Vitamin (MULTIVITAMIN) tablet Take 1 tablet by mouth daily.      [name] omeprazole (PRILOSEC) 20 MG capsule Take 1 capsule (20 mg total) by mouth 2 (two) times daily before a meal. 60 capsule 6  . OVER THE COUNTER MEDICATION Take 3 capsules by mouth every evening. Reported on 11/10/2015    . QUEtiapine (SEROQUEL) 50 MG tablet Take 1 tablet (50 mg total) by mouth at bedtime. 90 tablet 1  . simvastatin (ZOCOR) 40 MG tablet TAKE 1 TABLET BY MOUTH EVERY NIGHT AT BEDTIME 90 tablet 2  . varenicline (CHANTIX CONTINUING MONTH PAK) 1 MG tablet Take 1 tablet (1 mg total) by mouth 2 (two) times daily. 60 tablet 2  No facility-administered medications prior to visit.     Allergies:   Sulfonamide derivatives and Sulfamethoxazole   Social History   Social History  . Marital Status: Divorced    Spouse Name: N/A  . Number of Children: 2  . Years of Education: N/A   Social History Main Topics  . Smoking status: Current Every Day Smoker -- 30 years    Types: Cigarettes  . Smokeless tobacco: Never Used     Comment: trying to quit taking Chantix  . Alcohol Use: No  . Drug Use: No  . Sexual Activity: No   Other Topics Concern  . None   Social History Narrative     Family History:  The patient's family history includes Asthma in her father; Coronary artery disease in her brother and sister; Dementia in her mother; Hyperlipidemia in her brother, mother, sister, and sister; Hypertension in her brother, sister, and sister; Thyroid disease in her sister.   ROS:   Please see the history of present illness.    ROS All other systems  reviewed and are negative.   PHYSICAL EXAM:   VS:  BP 130/78 mmHg  Pulse 78  Ht 5\' 2"  (1.575 m)  Wt 166 lb 1.9 oz (75.352 kg)  BMI 30.38 kg/m2  SpO2 98%   GEN: Well nourished, well developed, in no acute distress HEENT: normal, s/p UPP with narrow ooropharynx Neck: no JVD, carotid bruits, or masses Cardiac: RRR; no murmurs, rubs, or gallops,no edema.  Intact distal pulses bilaterally.  Respiratory:  clear to auscultation bilaterally, normal work of breathing GI: soft, nontender, nondistended, + BS MS: no deformity or atrophy Skin: warm and dry, no rash Neuro:  Alert and Oriented x 3, Strength and sensation are intact Psych: euthymic mood, full affect  Wt Readings from Last 3 Encounters:  11/10/15 166 lb 1.9 oz (75.352 kg)  08/15/15 172 lb (78.019 kg)  08/02/15 170 lb (77.111 kg)      Studies/Labs Reviewed:   EKG:  EKG is not ordered today.    Recent Labs: 08/08/2015: ALT 21; BUN 10; Creatinine, Ser 0.63; Hemoglobin 12.5; Platelets 175.0; Potassium 4.1; Sodium 140; TSH 1.47   Lipid Panel    Component Value Date/Time   CHOL 183 08/08/2015 1351   TRIG 236.0* 08/08/2015 1351   HDL 42.30 08/08/2015 1351   CHOLHDL 4 08/08/2015 1351   VLDL 47.2* 08/08/2015 1351   LDLCALC 103* 08/25/2013 1339   LDLDIRECT 99.0 08/08/2015 1351    Additional studies/ records that were reviewed today include:  OV notes from PCP    ASSESSMENT:    1. Sleep apnea   2. Essential hypertension   3. Obesity (BMI 30-39.9)      PLAN:  In order of problems listed above:  1. OSA currently on CPAP and needing a new device and mask.  She is having severe daytime sleepiness, headaches and very poor sleep.  She currently uses a nasal mask that does not work well for her with severe leaking and will not stay in place.  I will get a split night sleep study with nasal pillow mask and chin strap as she has not tolerated a full face mask.  2. HTN - BP well controlled on diet alone. 3. Obesity - I have  encouraged him to get into a routine exercise program and cut back on carbs and portions. Her exercise is limited by her chronic back pain.     Medication Adjustments/Labs and Tests Ordered: Current medicines are reviewed at  length with the patient today.  Concerns regarding medicines are outlined above.  Medication changes, Labs and Tests ordered today are listed in the Patient Instructions below.  There are no Patient Instructions on file for this visit.   Signed, Armanda Magic, MD  11/10/2015 11:42 AM    [facility] [address] [place], [place], [place]  [address] Phone: [phone]; Fax: [phone]

## 2015-11-11 ENCOUNTER — Ambulatory Visit (HOSPITAL_BASED_OUTPATIENT_CLINIC_OR_DEPARTMENT_OTHER): Payer: PPO | Attending: Cardiology | Admitting: Cardiology

## 2015-11-11 VITALS — Ht 63.0 in | Wt 166.0 lb

## 2015-11-11 DIAGNOSIS — E669 Obesity, unspecified: Secondary | ICD-10-CM | POA: Diagnosis not present

## 2015-11-11 DIAGNOSIS — G4730 Sleep apnea, unspecified: Secondary | ICD-10-CM | POA: Diagnosis not present

## 2015-11-11 DIAGNOSIS — I10 Essential (primary) hypertension: Secondary | ICD-10-CM | POA: Diagnosis not present

## 2015-11-11 DIAGNOSIS — G4733 Obstructive sleep apnea (adult) (pediatric): Secondary | ICD-10-CM | POA: Diagnosis not present

## 2015-11-11 DIAGNOSIS — Z6829 Body mass index (BMI) 29.0-29.9, adult: Secondary | ICD-10-CM | POA: Insufficient documentation

## 2015-11-11 DIAGNOSIS — R5383 Other fatigue: Secondary | ICD-10-CM | POA: Insufficient documentation

## 2015-11-11 HISTORY — PX: SPLIT NIGHT STUDY: SLE1000

## 2015-11-14 ENCOUNTER — Encounter (HOSPITAL_BASED_OUTPATIENT_CLINIC_OR_DEPARTMENT_OTHER): Payer: Self-pay | Admitting: Cardiology

## 2015-11-14 ENCOUNTER — Telehealth: Payer: Self-pay | Admitting: Cardiology

## 2015-11-14 NOTE — Telephone Encounter (Signed)
Please let patient know that they have significant sleep apnea and had successful CPAP titration and will be set up with CPAP unit.  Please let DME know that order is in EPIC.  Please set patient up for OV in 10 weeks 

## 2015-11-14 NOTE — Procedures (Signed)
Patient Name: [name] [name], [name] [name] MRN: [id number] Study Date: 11/11/2015 Gender: Female D.O.B: [birth year] Age (years): 29 Referring Provider: Fransico Him MD, ABSM Interpreting Physician: Fransico Him MD, ABSM RPSGT: Baxter Flattery  Weight (lbs): 166 BMI: 29 Height (inches): 63 Neck Size: 15.00  CLINICAL INFORMATION Sleep Study Type: Split Night CPAP Indication for sleep study: Fatigue, Hypertension, Obesity, Snoring, Witnessed Apneas Epworth Sleepiness Score: 19  SLEEP STUDY TECHNIQUE As per the AASM Manual for the Scoring of Sleep and Associated Events v2.3 (April 2016) with a hypopnea requiring 4% desaturations. The channels recorded and monitored were frontal, central and occipital EEG, electrooculogram (EOG), submentalis EMG (chin), nasal and oral airflow, thoracic and abdominal wall motion, anterior tibialis EMG, snore microphone, electrocardiogram, and pulse oximetry. Continuous positive airway pressure (CPAP) was initiated when the patient met split night criteria and was titrated according to treat sleep-disordered breathing.  MEDICATIONS Medications taken by the patient : Reviewed in the chart Medications administered by patient during sleep study : No sleep medicine administered.  RESPIRATORY PARAMETERS Diagnostic Total AHI (/hr): 25.5  RDI (/hr):25.5  OA Index (/hr): 5.6   CA Index (/hr): 2.0 REM AHI (/hr):53.5  NREM AHI (/hr):19.3 Supine AHI (/hr):51.6   Non-supine AHI (/hr):1.89 Min O2 Sat (%):72.00  Mean O2 (%):91.47 Time below 88% (min):11.7    Titration Optimal Pressure (cm):10  AHI at Optimal Pressure (/hr):0.0  Min O2 at Optimal Pressure (%):91.0 Supine % at Optimal (%):100 Sleep % at Optimal (%):100    SLEEP ARCHITECTURE The recording time for the entire night was 421.4 minutes. During a baseline period of 214.4 minutes, the patient slept for 181.4 minutes in REM and nonREM, yielding a sleep efficiency of 84.6%. Sleep onset after lights out was 16.6  minutes with a prolonged REM latency of 140.0 minutes. The patient spent 6.34% of the night in stage N1 sleep, 75.74% in stage N2 sleep, 0.00% in stage N3 and 17.92% in REM.   During the titration period of 202.6 minutes, the patient slept for 202.6 minutes in REM and nonREM, yielding a sleep efficiency of 100.0%. Sleep onset after CPAP initiation was 0.0 minutes with a REM latency of 69.8 minutes. The patient spent 1.12% of the night in stage N1 sleep, 75.68% in stage N2 sleep, 0.00% in stage N3 and 23.20% in REM.  CARDIAC DATA The 2 lead EKG demonstrated sinus rhythm. The mean heart rate was 68.88 beats per minute. Other EKG findings include: None.  LEG MOVEMENT DATA The total Periodic Limb Movements of Sleep (PLMS) were 0. The PLMS index was 0.00 .  IMPRESSIONS - Moderate obstructive sleep apnea occurred during the diagnostic portion of the study(AHI = 25.5/hour). An optimal PAP pressure was selected for this patient ( 10 cm of water) - No significant central sleep apnea occurred during the diagnostic portion of the study (CAI = 2.0/hour). - Mild oxygen desaturation was noted during the diagnostic portion of the study (Min O2 = 72.00%) and minimal during the CPAP titration (Min O2 91% on CPAP at 10c m H2O) . - No snoring was audible during the diagnostic portion of the study. - No cardiac abnormalities were noted during this study. - Clinically significant periodic limb movements did not occur during sleep.  DIAGNOSIS - Obstructive Sleep Apnea (327.23 [G47.33 ICD-10])  RECOMMENDATIONS - Trial of CPAP therapy on 10 cm H2O with a Medium wide size Philips Respironics Nasal Mask DreamWear mask and heated humidification. - Avoid alcohol, sedatives and other CNS depressants that may worsen sleep apnea and  disrupt normal sleep architecture. - Sleep hygiene should be reviewed to assess factors that may improve sleep quality. - Weight management and regular exercise should be initiated or  continued. - Return to Sleep Center for re-evaluation after 10 weeks of therapy    [place]  ELECTRONICALLY SIGNED ON:  11/14/2015, 3:04 PM [place] PH: [phone]   FX: [phone] [place]

## 2015-11-15 ENCOUNTER — Other Ambulatory Visit (HOSPITAL_BASED_OUTPATIENT_CLINIC_OR_DEPARTMENT_OTHER): Payer: Self-pay

## 2015-11-15 DIAGNOSIS — G4730 Sleep apnea, unspecified: Secondary | ICD-10-CM

## 2015-11-17 ENCOUNTER — Telehealth: Payer: Self-pay | Admitting: Family Medicine

## 2015-11-17 NOTE — Telephone Encounter (Signed)
Per DPR - Spoke with husband to give results. Stated verbal understanding.    [facility] will be notified of orders -   Once patient is on CPAP, I will schedule 10 week follow up.

## 2015-11-17 NOTE — Telephone Encounter (Signed)
Pt request refill  HYDROcodone-acetaminophen (NORCO) 10-325 MG tablet  Will be out on sat.

## 2015-11-17 NOTE — Telephone Encounter (Signed)
Left message for patient to call back  

## 2015-11-18 MED ORDER — HYDROCODONE-ACETAMINOPHEN 10-325 MG PO TABS: 1.0000 | ORAL_TABLET | Freq: Four times a day (QID) | ORAL | Status: DC | PRN

## 2015-11-18 NOTE — Telephone Encounter (Signed)
done

## 2015-11-18 NOTE — Telephone Encounter (Signed)
Script is ready for pick up and I spoke with pt.  

## 2015-11-28 DIAGNOSIS — G4733 Obstructive sleep apnea (adult) (pediatric): Secondary | ICD-10-CM | POA: Diagnosis not present

## 2015-12-21 ENCOUNTER — Telehealth: Payer: Self-pay | Admitting: Family Medicine

## 2015-12-21 ENCOUNTER — Other Ambulatory Visit: Payer: Self-pay | Admitting: Family Medicine

## 2015-12-21 MED ORDER — HYDROCODONE-ACETAMINOPHEN 10-325 MG PO TABS: 1.0000 | ORAL_TABLET | Freq: Four times a day (QID) | ORAL | Status: DC | PRN

## 2015-12-21 NOTE — Telephone Encounter (Signed)
Script is ready for pick up and I spoke with pt.  

## 2015-12-21 NOTE — Telephone Encounter (Signed)
done

## 2015-12-21 NOTE — Telephone Encounter (Signed)
Pt needs new rx hydrocodone °

## 2015-12-26 ENCOUNTER — Institutional Professional Consult (permissible substitution): Payer: Self-pay | Admitting: Pulmonary Disease

## 2015-12-28 DIAGNOSIS — G4733 Obstructive sleep apnea (adult) (pediatric): Secondary | ICD-10-CM | POA: Diagnosis not present

## 2016-01-20 ENCOUNTER — Other Ambulatory Visit: Payer: Self-pay | Admitting: Family Medicine

## 2016-01-23 ENCOUNTER — Encounter: Payer: Self-pay | Admitting: Family Medicine

## 2016-01-23 ENCOUNTER — Ambulatory Visit (INDEPENDENT_AMBULATORY_CARE_PROVIDER_SITE_OTHER): Payer: PPO | Admitting: Family Medicine

## 2016-01-23 VITALS — BP 119/82 | HR 76 | Temp 98.1°F | Ht 63.0 in | Wt 167.0 lb

## 2016-01-23 DIAGNOSIS — F319 Bipolar disorder, unspecified: Secondary | ICD-10-CM | POA: Diagnosis not present

## 2016-01-23 DIAGNOSIS — R29818 Other symptoms and signs involving the nervous system: Secondary | ICD-10-CM

## 2016-01-23 DIAGNOSIS — R42 Dizziness and giddiness: Secondary | ICD-10-CM | POA: Diagnosis not present

## 2016-01-23 DIAGNOSIS — R413 Other amnesia: Secondary | ICD-10-CM

## 2016-01-23 DIAGNOSIS — I10 Essential (primary) hypertension: Secondary | ICD-10-CM | POA: Diagnosis not present

## 2016-01-23 DIAGNOSIS — R51 Headache: Secondary | ICD-10-CM

## 2016-01-23 DIAGNOSIS — R519 Headache, unspecified: Secondary | ICD-10-CM

## 2016-01-23 DIAGNOSIS — R2689 Other abnormalities of gait and mobility: Secondary | ICD-10-CM

## 2016-01-23 MED ORDER — HYDROCODONE-ACETAMINOPHEN 10-325 MG PO TABS: 1.0000 | ORAL_TABLET | Freq: Four times a day (QID) | ORAL | 0 refills | Status: DC | PRN

## 2016-01-23 NOTE — Progress Notes (Signed)
Pre visit review using our clinic review tool, if applicable. No additional management support is needed unless otherwise documented below in the visit note. 

## 2016-01-23 NOTE — Progress Notes (Signed)
   Subjective:    Patient ID: [name] [name], female    DOB: [birth year], 60 y.o.   MRN: [id number]  HPI Here to follow up on issues. She asks for a referral to a specialist to discuss some of her chronic problems including dizziness, balance issues, and memory loss. These problems seemed to stabilize for a year bit lately they have worsened again. She saw Dr. Jac CanavanKrauss for an ENT perspective a few years ago but she was not satisfied with the results of this workup.    Review of Systems  Constitutional: Negative.   Respiratory: Negative.   Cardiovascular: Negative.   Neurological: Positive for dizziness, light-headedness and headaches. Negative for tremors, seizures, syncope, facial asymmetry, speech difficulty, weakness and numbness.  Psychiatric/Behavioral: Negative.        Objective:   Physical Exam  Constitutional: She is oriented to person, place, and time. She appears well-developed and well-nourished.  Neck: No thyromegaly present.  Cardiovascular: Normal rate, regular rhythm, normal heart sounds and intact distal pulses.   Pulmonary/Chest: Effort normal and breath sounds normal.  Lymphadenopathy:    She has no cervical adenopathy.  Neurological: She is alert and oriented to person, place, and time.  I see no obvious balance issues today  Psychiatric: She has a normal mood and affect. Her behavior is normal. Thought content normal.          Assessment & Plan:  Her bipolar disorder is stable. She has had a recurrence of her old balance problems and dizziness so we will refer her to Neurology for a fresh perspective.  Nelwyn SalisburyFRY,STEPHEN A, MD

## 2016-01-28 DIAGNOSIS — G4733 Obstructive sleep apnea (adult) (pediatric): Secondary | ICD-10-CM | POA: Diagnosis not present

## 2016-01-31 ENCOUNTER — Telehealth: Payer: Self-pay | Admitting: Family Medicine

## 2016-01-31 NOTE — Telephone Encounter (Signed)
Pt was last seen on 8/7-17 and would like to proceed in getting ADD med pt is aware md out of office until thur

## 2016-02-03 ENCOUNTER — Telehealth: Payer: Self-pay | Admitting: Family Medicine

## 2016-02-03 MED ORDER — AMPHETAMINE-DEXTROAMPHET ER 10 MG PO CP24: 10.0000 mg | ORAL_CAPSULE | Freq: Every day | ORAL | 0 refills | Status: DC

## 2016-02-03 NOTE — Telephone Encounter (Signed)
Ready to pick up.  

## 2016-02-03 NOTE — Telephone Encounter (Signed)
Pt would like  New rx chantix call into [facility]

## 2016-02-03 NOTE — Telephone Encounter (Signed)
Patient notified Rx is ready for pick-up. 

## 2016-02-06 MED ORDER — VARENICLINE TARTRATE 1 MG PO TABS: 1.0000 mg | ORAL_TABLET | Freq: Two times a day (BID) | ORAL | 2 refills | Status: DC

## 2016-02-06 NOTE — Telephone Encounter (Signed)
done

## 2016-02-07 ENCOUNTER — Encounter: Payer: Self-pay | Admitting: Cardiology

## 2016-02-07 ENCOUNTER — Encounter: Payer: Self-pay | Admitting: Family Medicine

## 2016-02-07 ENCOUNTER — Telehealth: Payer: Self-pay | Admitting: Family Medicine

## 2016-02-07 DIAGNOSIS — F909 Attention-deficit hyperactivity disorder, unspecified type: Secondary | ICD-10-CM

## 2016-02-07 HISTORY — DX: Attention-deficit hyperactivity disorder, unspecified type: F90.9

## 2016-02-07 NOTE — Telephone Encounter (Signed)
PA for Adderall submitted on Cover My Meds.  Waiting for response.

## 2016-02-08 ENCOUNTER — Telehealth: Payer: Self-pay | Admitting: *Deleted

## 2016-02-08 NOTE — Telephone Encounter (Signed)
Refill request

## 2016-02-09 NOTE — Telephone Encounter (Signed)
Refill for one year 

## 2016-02-10 MED ORDER — POLYETHYLENE GLYCOL 3350 17 GM/SCOOP PO POWD: 17.0000 g | Freq: Two times a day (BID) | ORAL | 11 refills | Status: DC | PRN

## 2016-02-10 NOTE — Addendum Note (Signed)
Addended by: Doree BarthelLOWE, Tyrena Gohr on: 02/10/2016 03:07 PM   Modules accepted: Orders

## 2016-02-10 NOTE — Telephone Encounter (Signed)
Done

## 2016-02-16 ENCOUNTER — Ambulatory Visit: Payer: Self-pay | Admitting: Cardiology

## 2016-02-19 ENCOUNTER — Encounter: Payer: Self-pay | Admitting: Cardiology

## 2016-02-21 ENCOUNTER — Encounter: Payer: Self-pay | Admitting: Cardiology

## 2016-02-21 ENCOUNTER — Ambulatory Visit (INDEPENDENT_AMBULATORY_CARE_PROVIDER_SITE_OTHER): Payer: PPO | Admitting: Cardiology

## 2016-02-21 ENCOUNTER — Encounter (INDEPENDENT_AMBULATORY_CARE_PROVIDER_SITE_OTHER): Payer: Self-pay

## 2016-02-21 VITALS — BP 130/74 | HR 89 | Ht 63.0 in | Wt 167.6 lb

## 2016-02-21 DIAGNOSIS — G4730 Sleep apnea, unspecified: Secondary | ICD-10-CM

## 2016-02-21 DIAGNOSIS — E669 Obesity, unspecified: Secondary | ICD-10-CM | POA: Diagnosis not present

## 2016-02-21 DIAGNOSIS — I10 Essential (primary) hypertension: Secondary | ICD-10-CM | POA: Diagnosis not present

## 2016-02-21 NOTE — Patient Instructions (Signed)
Medication Instructions:  Your physician recommends that you continue on your current medications as directed. Please refer to the Current Medication list given to you today.   Labwork: None  Testing/Procedures: None  Follow-Up: Your physician wants you to follow-up in: 1 year with Dr. Mayford Knifeurner. You will receive a reminder letter in the mail two months in advance. If you don't receive a letter, please call our office to schedule the follow-up appointment.   Any Other Special Instructions Will Be Listed Below (If Applicable). We have ordered for you a 2-WEEK AUTO-TITRATION. We will call you to set your machine at optimal settings after the titration.  If you have any questions PAP related, feel free to call Toma CopierBethany, CMA (our office CPAP assistant) directly at [phone].    If you need a refill on your cardiac medications before your next appointment, please call your pharmacy.

## 2016-02-21 NOTE — Progress Notes (Signed)
Cardiology Office Note    Date:  02/21/2016   ID:  [name] [name], DOB Nov 12, 1955, MRN [id number]  PCP:  Nelwyn Salisbury, MD  Cardiologist:  Armanda Magic, MD   Chief Complaint  Patient presents with  . Sleep Apnea  . Hypertension    History of Present Illness:  [name] [name] is a 60 y.o. female with a history of OSA on CPAP therapy who presents for followup today.  She tolerates her CPAP device and feels the pressure is adequate.  She uses a nasal mask which she tolerates well.  She no longer snores.  She loves her CPAP device.  She feels rested in the am and has not daytime sleepiness.  She sleeps 8-9 hours and usually does not wake up.  She does not use a chin strap.  She has no mouth dryness and no nasal congestion.  Her am HAs have resolved..    Past Medical History:  Diagnosis Date  . Allergy    seasonal  . Depression    sees the [facility]   . GERD (gastroesophageal reflux disease)   . Hyperlipidemia   . Hypertension   . Memory loss    short term  . Obesity (BMI 30-39.9) 11/10/2015  . PPD positive 2007 or 2008  . Sleep apnea    on CPAP therapy  . Urinary incontinence     Past Surgical History:  Procedure Laterality Date  . ABDOMINAL HYSTERECTOMY    . ANTERIOR FUSION CERVICAL SPINE  2006   Dr. Wynetta Emery  . BREAST BIOPSY Right 1981   benign  . BREAST SURGERY Bilateral 12-11-12   reductions per Dr. Ivin Booty in [facility]   . COLONOSCOPY  04-23-11   per Dr. Loreta Ave, adenomatous polyps, repeat in 5 yrs   . ESOPHAGOGASTRODUODENOSCOPY  04-23-11   per Dr. Loreta Ave, normal   . LUMBAR DISC SURGERY  2003   Dr. Wynetta Emery  . SPLIT NIGHT STUDY  11/11/2015  . UVULOPALATOPHARYNGOPLASTY    . VAGINAL HYSTERECTOMY  12/96   secondary to fibroids.    Current Medications: Outpatient Medications Prior to Visit  Medication Sig Dispense Refill  . acyclovir (ZOVIRAX) 400 MG tablet TAKE 1 TABLET BY MOUTH FOUR TIMES A DAY AS NEEDED 120 tablet 1  . alprazolam (XANAX) 2 MG tablet  TAKE 1 TABLET BY MOUTH 3 TIMES A DAY AS NEEDED FOR ANXIETY 90 tablet 5  . amphetamine-dextroamphetamine (ADDERALL XR) 10 MG 24 hr capsule Take 1 capsule (10 mg total) by mouth daily. 30 capsule 0  . Cholecalciferol (VITAMIN D3) 2000 units capsule Take 2,000 Units by mouth daily.    . CycloSPORINE (RESTASIS OP) Apply 1 drop to eye as directed.    . Garlic 1000 MG CAPS Take 1,000 mg by mouth daily.    [name] HYDROcodone-acetaminophen (NORCO) 10-325 MG tablet Take 1 tablet by mouth every 6 (six) hours as needed for moderate pain. 120 tablet 0  . lamoTRIgine (LAMICTAL) 100 MG tablet TAKE 1 TABLET BY MOUTH 2 TIMES A DAY. 60 tablet 11  . Multiple Vitamin (MULTIVITAMIN) tablet Take 1 tablet by mouth daily.      . Omega-3 Fatty Acids (FISH OIL PO) Take by mouth as directed.    [name] omeprazole (PRILOSEC) 20 MG capsule Take 1 capsule (20 mg total) by mouth 2 (two) times daily before a meal. 60 capsule 6  . OVER THE COUNTER MEDICATION Take 3 capsules by mouth every evening. Stool softner    . polyethylene glycol powder (  GLYCOLAX/MIRALAX) powder Take 17 g by mouth 2 (two) times daily as needed. 3350 g 11  . Probiotic Product (PROBIOTIC PO) Take by mouth daily.    . QUEtiapine (SEROQUEL) 50 MG tablet Take 1 tablet (50 mg total) by mouth at bedtime. 90 tablet 1  . simvastatin (ZOCOR) 40 MG tablet TAKE 1 TABLET BY MOUTH EVERY NIGHT AT BEDTIME 90 tablet 2  . varenicline (CHANTIX CONTINUING MONTH PAK) 1 MG tablet Take 1 tablet (1 mg total) by mouth 2 (two) times daily. 60 tablet 2  . QUEtiapine (SEROQUEL) 50 MG tablet TAKE 1 TABLET BY MOUTH AT BEDTIME. 90 tablet 3   No facility-administered medications prior to visit.      Allergies:   Sulfonamide derivatives and Sulfamethoxazole   Social History   Social History  . Marital status: Divorced    Spouse name: N/A  . Number of children: 2  . Years of education: N/A   Social History Main Topics  . Smoking status: Current Some Day Smoker    Years: 30.00    Types:  Cigarettes  . Smokeless tobacco: Never Used     Comment: trying to quit taking Chantix  . Alcohol use No  . Drug use: No  . Sexual activity: No   Other Topics Concern  . None   Social History Narrative  . None     Family History:  The patient's family history includes Asthma in her father; Coronary artery disease in her brother and sister; Dementia in her mother; Hyperlipidemia in her brother, mother, sister, and sister; Hypertension in her brother, sister, and sister; Thyroid disease in her sister.   ROS:   Please see the history of present illness.    ROS All other systems reviewed and are negative.   PHYSICAL EXAM:   VS:  BP 130/74   Pulse 89   Ht 5\' 3"  (1.6 m)   Wt 167 lb 9.6 oz (76 kg)   SpO2 97%   BMI 29.69 kg/m    GEN: Well nourished, well developed, in no acute distress  HEENT: normal  Neck: no JVD, carotid bruits, or masses Cardiac: RRR; no murmurs, rubs, or gallops,no edema.  Intact distal pulses bilaterally.  Respiratory:  clear to auscultation bilaterally, normal work of breathing GI: soft, nontender, nondistended, + BS MS: no deformity or atrophy  Skin: warm and dry, no rash Neuro:  Alert and Oriented x 3, Strength and sensation are intact Psych: euthymic mood, full affect  Wt Readings from Last 3 Encounters:  02/21/16 167 lb 9.6 oz (76 kg)  01/23/16 167 lb (75.8 kg)  11/11/15 166 lb (75.3 kg)      Studies/Labs Reviewed:   EKG:  EKG is not ordered today.    Recent Labs: 08/08/2015: ALT 21; BUN 10; Creatinine, Ser 0.63; Hemoglobin 12.5; Platelets 175.0; Potassium 4.1; Sodium 140; TSH 1.47   Lipid Panel    Component Value Date/Time   CHOL 183 08/08/2015 1351   TRIG 236.0 (H) 08/08/2015 1351   HDL 42.30 08/08/2015 1351   CHOLHDL 4 08/08/2015 1351   VLDL 47.2 (H) 08/08/2015 1351   LDLCALC 103 (H) 08/25/2013 1339   LDLDIRECT 99.0 08/08/2015 1351    Additional studies/ records that were reviewed today include:  CPAP download    ASSESSMENT:     1. Sleep apnea   2. Obesity (BMI 30-39.9)   3. Essential hypertension      PLAN:  In order of problems listed above:  OSA - the patient is  tolerating PAP therapy well without any problems. The PAP download was reviewed today and showed an AHI of 9.4/hr on 10 cm H2O with 87% compliance in using more than 4 hours nightly.  The patient has been using and benefiting from CPAP use and will continue to benefit from therapy.   I will get a 2 week autotitration from 4 to 18cm H2O due to persistently elevated AHI although improved from her PSG. Obesity - I have encouraged him to get into a routine exercise program and cut back on carbs and portions.  HTN - BP controlled on diet alone.     Medication Adjustments/Labs and Tests Ordered: Current medicines are reviewed at length with the patient today.  Concerns regarding medicines are outlined above.  Medication changes, Labs and Tests ordered today are listed in the Patient Instructions below.  There are no Patient Instructions on file for this visit.   Signed, Armanda Magic, MD  02/21/2016 11:47 AM    [facility] [address], [place], [place]  [address] Phone: [phone]; Fax: [phone]

## 2016-02-22 ENCOUNTER — Telehealth: Payer: Self-pay

## 2016-02-22 NOTE — Telephone Encounter (Signed)
Received fax from insurance that there is a PA Approval on file until 12.31.2017.

## 2016-02-24 ENCOUNTER — Telehealth: Payer: Self-pay | Admitting: Family Medicine

## 2016-02-24 MED ORDER — ALPRAZOLAM 2 MG PO TABS: ORAL_TABLET | ORAL | 5 refills | Status: DC

## 2016-02-24 NOTE — Telephone Encounter (Signed)
Call in #90 with 5 rf 

## 2016-02-24 NOTE — Telephone Encounter (Signed)
I called in script to below pharmacy and they will notify pt when it is ready for pick up.

## 2016-02-24 NOTE — Telephone Encounter (Signed)
Pt following up on rx request. Advised pt of the 3 day response.. Pt states she was not aware , but will remember next time.  Pt states she has had a lot going on and would appreciate if we could refill today.

## 2016-02-24 NOTE — Telephone Encounter (Signed)
Refill request for Alprazolam 2 mg take 1 po tid prn and send to [facility].

## 2016-02-28 DIAGNOSIS — G4733 Obstructive sleep apnea (adult) (pediatric): Secondary | ICD-10-CM | POA: Diagnosis not present

## 2016-03-13 DIAGNOSIS — Z1211 Encounter for screening for malignant neoplasm of colon: Secondary | ICD-10-CM | POA: Diagnosis not present

## 2016-03-13 DIAGNOSIS — K5904 Chronic idiopathic constipation: Secondary | ICD-10-CM | POA: Diagnosis not present

## 2016-03-13 DIAGNOSIS — K219 Gastro-esophageal reflux disease without esophagitis: Secondary | ICD-10-CM | POA: Diagnosis not present

## 2016-03-14 ENCOUNTER — Encounter: Payer: Self-pay | Admitting: Cardiology

## 2016-03-15 ENCOUNTER — Telehealth: Payer: Self-pay | Admitting: Family Medicine

## 2016-03-15 NOTE — Telephone Encounter (Signed)
° ° °  Pt request refill of the following: ° ° °amphetamine-dextroamphetamine (ADDERALL XR) 10 MG 24 hr capsule ° °Phamacy: °

## 2016-03-16 MED ORDER — AMPHETAMINE-DEXTROAMPHET ER 10 MG PO CP24: 10.0000 mg | ORAL_CAPSULE | Freq: Every day | ORAL | 0 refills | Status: DC

## 2016-03-16 NOTE — Telephone Encounter (Signed)
done

## 2016-03-19 NOTE — Telephone Encounter (Signed)
Script is ready for pick up here at front office, tried to reach pt and no answer.  

## 2016-03-26 ENCOUNTER — Ambulatory Visit (INDEPENDENT_AMBULATORY_CARE_PROVIDER_SITE_OTHER): Payer: PPO | Admitting: Family Medicine

## 2016-03-26 DIAGNOSIS — Z2911 Encounter for prophylactic immunotherapy for respiratory syncytial virus (RSV): Secondary | ICD-10-CM

## 2016-03-26 DIAGNOSIS — Z23 Encounter for immunization: Secondary | ICD-10-CM | POA: Diagnosis not present

## 2016-03-27 ENCOUNTER — Ambulatory Visit: Payer: Self-pay | Admitting: Family Medicine

## 2016-03-27 ENCOUNTER — Telehealth: Payer: Self-pay

## 2016-03-27 ENCOUNTER — Ambulatory Visit: Payer: Self-pay

## 2016-03-27 NOTE — Telephone Encounter (Signed)
Records request faxed to [facility] (Ph:[phone] Fax: [phone]), Dr. Haroldine Lawsrossley, ENT (Ph: [phone] Fax: [phone]), and [facility] (Dr. Dorma RussellKraus) (Ph: [phone] [phone])

## 2016-03-30 ENCOUNTER — Ambulatory Visit (INDEPENDENT_AMBULATORY_CARE_PROVIDER_SITE_OTHER): Payer: PPO | Admitting: Neurology

## 2016-03-30 ENCOUNTER — Other Ambulatory Visit (INDEPENDENT_AMBULATORY_CARE_PROVIDER_SITE_OTHER): Payer: PPO

## 2016-03-30 ENCOUNTER — Encounter: Payer: Self-pay | Admitting: Neurology

## 2016-03-30 VITALS — BP 128/68 | HR 101 | Ht 63.0 in | Wt 161.4 lb

## 2016-03-30 DIAGNOSIS — F17200 Nicotine dependence, unspecified, uncomplicated: Secondary | ICD-10-CM | POA: Diagnosis not present

## 2016-03-30 DIAGNOSIS — R413 Other amnesia: Secondary | ICD-10-CM

## 2016-03-30 DIAGNOSIS — R2689 Other abnormalities of gait and mobility: Secondary | ICD-10-CM

## 2016-03-30 LAB — VITAMIN B12: Vitamin B-12: 683 pg/mL (ref 211–911)

## 2016-03-30 NOTE — Patient Instructions (Signed)
I don't have a neurologic explanation for your balance problems.  However, I would like to investigate your memory problems further.  1.  We will check another B12 level  2.  We will refer you for formal neuropsychological testing  3.  Follow up after testing to discuss results and next step.

## 2016-03-30 NOTE — Progress Notes (Signed)
NEUROLOGY CONSULTATION NOTE  [name] [name] MRN: [id number] DOB: [birth year]  Referring provider: Dr. Clent Ridges Primary care provider: Dr. Clent Ridges  Reason for consult:  Balance problems, dizziness, memory loss  HISTORY OF PRESENT ILLNESS: [name] [name] is a 60 year old right-handed woman with bipolar disorder and cigarette smoker with history of left Bell's palsy who presents for dizziness and memory issues.  History obtained by patient, ENT note and PCP note.  Imaging of brain MRIs from 04/19/13, cervical MRI from 04/19/13 and lumbar MRI from 07/13/10 were personally reviewed.  She is accompanied by her husband who supplements history.  Problems with balance problems, dizziness and memory since approximately 2008 or 2009 following a nervous breakdown.    The dizziness and balance problem is not a spinning sensation.  Her head feels like it is bobbling.  She says she veers to the right.  It occurs when walking or when kneeling.  She has bilateral aural fullness and tinnitus.  She endorses hearing loss in the left ear.  She has been worked up by ENT and neurology in the past.  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  However, she had a repeat audiogram on 07/23/13, which was normal.  As per ENT note from 07/28/13, VNG showed suspected mixed central and peripheral findings, as he mentions "spontaneous nystagmus indicative of a vestibular lesion, consistent with central vestibular lesion, left-sided caloric weakness is often peripheral in vestibular finding."  She has had several brain MRIs.  An MRI of the brain from 2008 showed white matter changes, likely chronic small vessel ischemic changes. Repeat MRI of brain with and without contrast from 04/17/10 again showed mild white matter changes.  Most recent MRI of brain from 04/19/13 again demonstrated normal major intracranial vascular flow and mild nonspecific white  matter changes, which may be seen with chronic small vessel ischemic changes or migraine.    She has history of ACDF at C5-6.  MRI of the cervical spine was performed on 11/05/06, which showed post-surgical fusion at C5-6 and mild foraminal narrowing at C3-4 but no canal stenosis.  Repeat MRI of cervical spine from 04/19/13 again demonstrated no spinal stenosis.  She also has history of back and hip pain with prior lumbar laminotomy.  Most recent MRI of lumbar spine from 07/13/10 showed disc protrusion at L3-4 with facet arthritis, L4-5 disc bulge without stenosis and L5-S1 left laminotomy and facet arthrosis.  She also reports memory problems since 2009, specifically short-term memory, which she says has gotten worse.  She quickly forgets conversations and has to repeat questions often.  She has trouble remembering to take medication.  She denies disorientation when driving.  She pays her own bills and reports once forgetting to pay a bill, which is not usual.  TSH from 08/08/15 was 1.47.  She had a B12 level from 01/23/11, which was 846.  PAST MEDICAL HISTORY: Past Medical History:  Diagnosis Date  . Allergy    seasonal  . Depression    sees the [facility]   . GERD (gastroesophageal reflux disease)   . Hyperlipidemia   . Hypertension   . Memory loss    short term  . Obesity (BMI 30-39.9) 11/10/2015  . PPD positive 2007 or 2008  . Sleep apnea    on CPAP therapy  . Urinary incontinence     PAST SURGICAL HISTORY: Past Surgical History:  Procedure Laterality Date  . ABDOMINAL HYSTERECTOMY    .  ANTERIOR FUSION CERVICAL SPINE  2006   Dr. Wynetta Emeryram  . BREAST BIOPSY Right 1981   benign  . BREAST SURGERY Bilateral 12-11-12   [facility]gil [facility]   . COLONOSCOPY  04-23-11   per Dr. Loreta AveMann, adenomatous polyps, repeat in 5 yrs   . ESOPHAGOGASTRODUODENOSCOPY  04-23-11   per Dr. Loreta AveMann, normal   . LUMBAR DISC SURGERY  2003   Dr. Wynetta Emeryram  . SPLIT NIGHT STUDY  11/11/2015  .  UVULOPALATOPHARYNGOPLASTY    . VAGINAL HYSTERECTOMY  12/96   secondary to fibroids.    MEDICATIONS: Current Outpatient Prescriptions on File Prior to Visit  Medication Sig Dispense Refill  . acyclovir (ZOVIRAX) 400 MG tablet TAKE 1 TABLET BY MOUTH FOUR TIMES A DAY AS NEEDED 120 tablet 1  . alprazolam (XANAX) 2 MG tablet TAKE 1 TABLET BY MOUTH 3 TIMES A DAY AS NEEDED FOR ANXIETY 90 tablet 5  . amphetamine-dextroamphetamine (ADDERALL XR) 10 MG 24 hr capsule Take 1 capsule (10 mg total) by mouth daily. 30 capsule 0  . Cholecalciferol (VITAMIN D3) 2000 units capsule Take 2,000 Units by mouth daily.    . CycloSPORINE (RESTASIS OP) Apply 1 drop to eye as directed.    . Garlic 1000 MG CAPS Take 1,000 mg by mouth daily.    [name]. HYDROcodone-acetaminophen (NORCO) 10-325 MG tablet Take 1 tablet by mouth every 6 (six) hours as needed for moderate pain. 120 tablet 0  . lamoTRIgine (LAMICTAL) 100 MG tablet TAKE 1 TABLET BY MOUTH 2 TIMES A DAY. 60 tablet 11  . Multiple Vitamin (MULTIVITAMIN) tablet Take 1 tablet by mouth daily.      . Omega-3 Fatty Acids (FISH OIL PO) Take by mouth as directed.    [name]. omeprazole (PRILOSEC) 20 MG capsule Take 1 capsule (20 mg total) by mouth 2 (two) times daily before a meal. 60 capsule 6  . OVER THE COUNTER MEDICATION Take 3 capsules by mouth every evening. Stool softner    . polyethylene glycol powder (GLYCOLAX/MIRALAX) powder Take 17 g by mouth 2 (two) times daily as needed. 3350 g 11  . Probiotic Product (PROBIOTIC PO) Take by mouth daily.    . QUEtiapine (SEROQUEL) 50 MG tablet Take 1 tablet (50 mg total) by mouth at bedtime. 90 tablet 1  . simvastatin (ZOCOR) 40 MG tablet TAKE 1 TABLET BY MOUTH EVERY NIGHT AT BEDTIME 90 tablet 2   No current facility-administered medications on file prior to visit.     ALLERGIES: Allergies  Allergen Reactions  . Sulfonamide Derivatives Other (See Comments)    Facial swelling  . Sulfamethoxazole Other (See Comments)    unknown     FAMILY HISTORY: Family History  Problem Relation Age of Onset  . Dementia Mother   . Hyperlipidemia Mother   . Asthma Father   . Hypertension Sister   . Hyperlipidemia Sister   . Coronary artery disease Sister   . Thyroid disease Sister   . Hypertension Brother   . Hyperlipidemia Brother   . Coronary artery disease Brother   . Hypertension Sister   . Hyperlipidemia Sister   . Alcohol abuse      fhx  . Arthritis      fhx  . Coronary artery disease      fhx  . Depression      fhx  . Diabetes      fhx  . Hyperlipidemia      fhx  . Hypertension  fhx  . Sudden death      fhx    SOCIAL HISTORY: Social History   Social History  . Marital status: Divorced    Spouse name: N/A  . Number of children: 2  . Years of education: N/A   Occupational History  . Not on file.   Social History Main Topics  . Smoking status: Current Some Day Smoker    Years: 30.00    Types: Cigarettes  . Smokeless tobacco: Never Used     Comment: trying to quit taking Chantix  . Alcohol use No  . Drug use: No  . Sexual activity: No   Other Topics Concern  . Not on file   Social History Narrative  . No narrative on file    REVIEW OF SYSTEMS: Constitutional: No fevers, chills, or sweats, no generalized fatigue, change in appetite Eyes: No visual changes, double vision, eye pain Ear, nose and throat: No hearing loss, ear pain, nasal congestion, sore throat Cardiovascular: No chest pain, palpitations Respiratory:  No shortness of breath at rest or with exertion, wheezes GastrointestinaI: No nausea, vomiting, diarrhea, abdominal pain, fecal incontinence Genitourinary:  No dysuria, urinary retention or frequency Musculoskeletal:  No neck pain, back pain Integumentary: No rash, pruritus, skin lesions Neurological: as above Psychiatric: No depression, insomnia, anxiety Endocrine: No palpitations, fatigue, diaphoresis, mood swings, change in appetite, change in weight, increased  thirst Hematologic/Lymphatic:  No purpura, petechiae. Allergic/Immunologic: no itchy/runny eyes, nasal congestion, recent allergic reactions, rashes  PHYSICAL EXAM: Vitals:   03/30/16 1500  BP: 128/68  Pulse: (!) 101   General: No acute distress.  Patient appears well-groomed.  Head:  Normocephalic/atraumatic Eyes:  fundi examined but not visualized Neck: supple, no paraspinal tenderness, full range of motion Back: No paraspinal tenderness Heart: regular rate and rhythm Lungs: Clear to auscultation bilaterally. Vascular: No carotid bruits. Neurological Exam: Mental status: alert and oriented to person, place, and time, recent and remote memory intact, fund of knowledge intact, attention and concentration intact, speech fluent and not dysarthric, language intact. MMSE - Mini Mental State Exam 03/30/2016  Orientation to time 5  Orientation to Place 4  Registration 3  Attention/ Calculation 4  Recall 2  Language- name 2 objects 2  Language- repeat 1  Language- follow 3 step command 3  Language- read & follow direction 1  Write a sentence 1  Copy design 1  Total score 27   Cranial nerves: CN I: not tested CN II: pupils equal, round and reactive to light, visual fields intact CN III, IV, VI:  full range of motion, no nystagmus, no ptosis CN V: facial sensation intact CN VII: upper and lower face symmetric CN VIII: hearing intact CN IX, X: gag intact, uvula midline CN XI: sternocleidomastoid and trapezius muscles intact CN XII: tongue midline Bulk & Tone: normal, no fasciculations. Motor:  5/5 throughout  Sensation:  Pinprick and vibration sensation intact. Deep Tendon Reflexes:  2+ throughout, toes downgoing.  Finger to nose testing:  Without dysmetria.  Heel to shin:  Without dysmetria.  Gait:  Normal station and stride.  Able to turn but has trouble with tandem walk. Romberg negative.  IMPRESSION: Dizziness and balance problems.  I don't suspect this is neurologic.   She has had an extensive workup, including multiple MRIs of the brain and cervical spine.  Imaging of the lumbar spine does not reveal stenosis or pathology that would explain balance issues.  My suspicion is that this may be a somatoform disorder.  Memory deficits.  No significant memory deficits or signs of cognitive impairment are appreciated on today's exam.  We will pursue further testing.  PLAN: 1.  We will check another B12 level and refer for neuropsychological testing.  She will follow up afterwards. 2.  Recommend smoking cessation   Thank you for allowing me to take part in the care of this patient.  Shon Millet, DO  CC: Gershon Crane, MD

## 2016-04-02 ENCOUNTER — Telehealth: Payer: Self-pay

## 2016-04-02 NOTE — Telephone Encounter (Signed)
Sent via mychart

## 2016-04-02 NOTE — Telephone Encounter (Signed)
-----   Message from Drema DallasAdam R Jaffe, DO sent at 04/01/2016  4:19 PM EDT ----- B12 is normal

## 2016-04-09 ENCOUNTER — Ambulatory Visit: Payer: PPO | Admitting: Family Medicine

## 2016-04-15 DIAGNOSIS — G4733 Obstructive sleep apnea (adult) (pediatric): Secondary | ICD-10-CM | POA: Diagnosis not present

## 2016-04-17 ENCOUNTER — Telehealth: Payer: Self-pay | Admitting: Family Medicine

## 2016-04-17 NOTE — Telephone Encounter (Signed)
Pt need new Rx for Adderall °

## 2016-04-18 ENCOUNTER — Other Ambulatory Visit: Payer: Self-pay | Admitting: Family Medicine

## 2016-04-18 ENCOUNTER — Telehealth: Payer: Self-pay | Admitting: Family Medicine

## 2016-04-18 DIAGNOSIS — R42 Dizziness and giddiness: Secondary | ICD-10-CM

## 2016-04-18 NOTE — Telephone Encounter (Signed)
Pt would like a referral  To Hilaria OtaJohn T. McElveen, Jr., MD    At [facility]  Attn; Konrad FelixKatelyn Fax: [phone] Phone: [phone]  Pt would like to get in before the end of the year. They have only 4 openings left  Husband states Dr is aware of pt's balance issues. A referral was sent 6 months ago, and they never went to this MD. Now the referral has expired and pt needs new one sent asap.  Ok to leave message on voice mail and/or [employer] message.

## 2016-04-19 ENCOUNTER — Ambulatory Visit (INDEPENDENT_AMBULATORY_CARE_PROVIDER_SITE_OTHER): Payer: PPO | Admitting: Family Medicine

## 2016-04-19 DIAGNOSIS — Z23 Encounter for immunization: Secondary | ICD-10-CM | POA: Diagnosis not present

## 2016-04-19 NOTE — Telephone Encounter (Signed)
I called and spoke with pt

## 2016-04-19 NOTE — Telephone Encounter (Signed)
Referral was done  

## 2016-04-20 DIAGNOSIS — Z7689 Persons encountering health services in other specified circumstances: Secondary | ICD-10-CM

## 2016-04-23 ENCOUNTER — Other Ambulatory Visit: Payer: Self-pay | Admitting: Family Medicine

## 2016-04-23 ENCOUNTER — Telehealth: Payer: Self-pay | Admitting: Family Medicine

## 2016-04-23 NOTE — Telephone Encounter (Signed)
Pt needs new rx hydrocodone °

## 2016-04-23 NOTE — Telephone Encounter (Signed)
This is a duplicate request, see RX refill, waiting on approval from doctor.

## 2016-04-24 MED ORDER — HYDROCODONE-ACETAMINOPHEN 10-325 MG PO TABS: ORAL_TABLET | ORAL | 0 refills | Status: DC

## 2016-04-24 NOTE — Telephone Encounter (Signed)
Script is ready for pick up here at front office and I spoke with pt.  

## 2016-04-24 NOTE — Telephone Encounter (Signed)
done

## 2016-04-25 DIAGNOSIS — Z1211 Encounter for screening for malignant neoplasm of colon: Secondary | ICD-10-CM | POA: Diagnosis not present

## 2016-04-25 DIAGNOSIS — Z86010 Personal history of colonic polyps: Secondary | ICD-10-CM | POA: Diagnosis not present

## 2016-04-25 HISTORY — PX: COLONOSCOPY: SHX174

## 2016-05-01 ENCOUNTER — Ambulatory Visit (INDEPENDENT_AMBULATORY_CARE_PROVIDER_SITE_OTHER): Payer: PPO | Admitting: Psychology

## 2016-05-01 ENCOUNTER — Encounter: Payer: Self-pay | Admitting: Psychology

## 2016-05-01 DIAGNOSIS — F3130 Bipolar disorder, current episode depressed, mild or moderate severity, unspecified: Secondary | ICD-10-CM

## 2016-05-01 DIAGNOSIS — R413 Other amnesia: Secondary | ICD-10-CM | POA: Diagnosis not present
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
## 2016-05-02 ENCOUNTER — Telehealth: Payer: Self-pay | Admitting: Family Medicine

## 2016-05-02 NOTE — Telephone Encounter (Signed)
What is patient talking about?

## 2016-05-02 NOTE — Telephone Encounter (Signed)
Pharmacy calling to check the status of the request of lidocaine.

## 2016-05-02 NOTE — Telephone Encounter (Signed)
[facility] Lidocaine ointment, see paper fax.

## 2016-05-02 NOTE — Telephone Encounter (Signed)
Not sure something about a cream for her arms and legs.

## 2016-05-03 DIAGNOSIS — H9312 Tinnitus, left ear: Secondary | ICD-10-CM | POA: Diagnosis not present

## 2016-05-03 DIAGNOSIS — H8143 Vertigo of central origin, bilateral: Secondary | ICD-10-CM | POA: Diagnosis not present

## 2016-05-03 DIAGNOSIS — R42 Dizziness and giddiness: Secondary | ICD-10-CM | POA: Diagnosis not present

## 2016-05-03 DIAGNOSIS — H903 Sensorineural hearing loss, bilateral: Secondary | ICD-10-CM | POA: Diagnosis not present

## 2016-05-16 DIAGNOSIS — G4733 Obstructive sleep apnea (adult) (pediatric): Secondary | ICD-10-CM | POA: Diagnosis not present

## 2016-05-22 ENCOUNTER — Encounter: Payer: Self-pay | Admitting: Psychology

## 2016-05-28 ENCOUNTER — Emergency Department (HOSPITAL_BASED_OUTPATIENT_CLINIC_OR_DEPARTMENT_OTHER): Payer: PPO

## 2016-05-28 ENCOUNTER — Encounter (HOSPITAL_BASED_OUTPATIENT_CLINIC_OR_DEPARTMENT_OTHER): Payer: Self-pay

## 2016-05-28 ENCOUNTER — Emergency Department (HOSPITAL_BASED_OUTPATIENT_CLINIC_OR_DEPARTMENT_OTHER)
Admission: EM | Admit: 2016-05-28 | Discharge: 2016-05-28 | Disposition: A | Discharge status: Home / Self Care | Payer: PPO | Attending: Emergency Medicine | Admitting: Emergency Medicine

## 2016-05-28 DIAGNOSIS — F17210 Nicotine dependence, cigarettes, uncomplicated: Secondary | ICD-10-CM | POA: Diagnosis not present

## 2016-05-28 DIAGNOSIS — Z79899 Other long term (current) drug therapy: Secondary | ICD-10-CM | POA: Insufficient documentation

## 2016-05-28 DIAGNOSIS — F909 Attention-deficit hyperactivity disorder, unspecified type: Secondary | ICD-10-CM | POA: Diagnosis not present

## 2016-05-28 DIAGNOSIS — R630 Anorexia: Secondary | ICD-10-CM | POA: Diagnosis not present

## 2016-05-28 DIAGNOSIS — R1084 Generalized abdominal pain: Secondary | ICD-10-CM

## 2016-05-28 DIAGNOSIS — R5383 Other fatigue: Secondary | ICD-10-CM | POA: Diagnosis not present

## 2016-05-28 DIAGNOSIS — I10 Essential (primary) hypertension: Secondary | ICD-10-CM | POA: Insufficient documentation

## 2016-05-28 DIAGNOSIS — K921 Melena: Secondary | ICD-10-CM | POA: Diagnosis not present

## 2016-05-28 DIAGNOSIS — R1030 Lower abdominal pain, unspecified: Secondary | ICD-10-CM | POA: Diagnosis not present

## 2016-05-28 HISTORY — DX: Unspecified disorder of vestibular function, unspecified ear: H81.90

## 2016-05-28 HISTORY — DX: Dependence on other enabling machines and devices: Z99.89

## 2016-05-28 LAB — CBC WITH DIFFERENTIAL/PLATELET
Basophils Absolute: 0 10*3/uL (ref 0.0–0.1)
Basophils Relative: 1 %
Eosinophils Absolute: 0.4 10*3/uL (ref 0.0–0.7)
Eosinophils Relative: 5 %
HCT: 37.7 % (ref 36.0–46.0)
Hemoglobin: 12.4 g/dL (ref 12.0–15.0)
Lymphocytes Relative: 35 %
Lymphs Abs: 2.3 10*3/uL (ref 0.7–4.0)
MCH: 30.8 pg (ref 26.0–34.0)
MCHC: 32.9 g/dL (ref 30.0–36.0)
MCV: 93.5 fL (ref 78.0–100.0)
Monocytes Absolute: 0.5 10*3/uL (ref 0.1–1.0)
Monocytes Relative: 7 %
Neutro Abs: 3.3 10*3/uL (ref 1.7–7.7)
Neutrophils Relative %: 52 %
Platelets: 154 10*3/uL (ref 150–400)
RBC: 4.03 MIL/uL (ref 3.87–5.11)
RDW: 13 % (ref 11.5–15.5)
WBC: 6.4 10*3/uL (ref 4.0–10.5)

## 2016-05-28 LAB — URINALYSIS, ROUTINE W REFLEX MICROSCOPIC
Bilirubin Urine: NEGATIVE
Glucose, UA: NEGATIVE mg/dL
Hgb urine dipstick: NEGATIVE
Ketones, ur: NEGATIVE mg/dL
Leukocytes, UA: NEGATIVE
Nitrite: NEGATIVE
Protein, ur: NEGATIVE mg/dL
Specific Gravity, Urine: 1.026 (ref 1.005–1.030)
pH: 5.5 (ref 5.0–8.0)

## 2016-05-28 LAB — COMPREHENSIVE METABOLIC PANEL
ALT: 16 U/L (ref 14–54)
AST: 27 U/L (ref 15–41)
Albumin: 4.4 g/dL (ref 3.5–5.0)
Alkaline Phosphatase: 52 U/L (ref 38–126)
Anion gap: 8 (ref 5–15)
BUN: 13 mg/dL (ref 6–20)
CO2: 30 mmol/L (ref 22–32)
Calcium: 9.8 mg/dL (ref 8.9–10.3)
Chloride: 101 mmol/L (ref 101–111)
Creatinine, Ser: 0.63 mg/dL (ref 0.44–1.00)
GFR calc Af Amer: >60 mL/min (ref >60)
GFR calc non Af Amer: >60 mL/min (ref >60)
Glucose, Bld: 94 mg/dL (ref 65–99)
Potassium: 4 mmol/L (ref 3.5–5.1)
Sodium: 139 mmol/L (ref 135–145)
Total Bilirubin: 0.3 mg/dL (ref 0.3–1.2)
Total Protein: 7.9 g/dL (ref 6.5–8.1)

## 2016-05-28 LAB — LIPASE, BLOOD: Lipase: 17 U/L (ref 11–51)

## 2016-05-28 LAB — OCCULT BLOOD X 1 CARD TO LAB, STOOL: Fecal Occult Bld: NEGATIVE

## 2016-05-28 LAB — WET PREP, GENITAL
Clue Cells Wet Prep HPF POC: NONE SEEN
Sperm: NONE SEEN
Trich, Wet Prep: NONE SEEN
Yeast Wet Prep HPF POC: NONE SEEN

## 2016-05-28 MED ORDER — SUCRALFATE 1 GM/10ML PO SUSP: 1.0000 g | Freq: Three times a day (TID) | ORAL | 0 refills | Status: DC

## 2016-05-28 MED ORDER — OMEPRAZOLE 20 MG PO CPDR: 20.0000 mg | DELAYED_RELEASE_CAPSULE | Freq: Two times a day (BID) | ORAL | 6 refills | Status: DC

## 2016-05-28 MED ORDER — SODIUM CHLORIDE 0.9 % IV BOLUS (SEPSIS)
1000.0000 mL | Freq: Once | INTRAVENOUS | Status: AC
  Administered 2016-05-28: 1000 mL via INTRAVENOUS

## 2016-05-28 MED ORDER — IOPAMIDOL (ISOVUE-300) INJECTION 61%
100.0000 mL | Freq: Once | INTRAVENOUS | Status: AC | PRN
  Administered 2016-05-28: 100 mL via INTRAVENOUS

## 2016-05-28 NOTE — ED Provider Notes (Signed)
Pt. Signed out to be by Trixie DredgeEmily West, PA-C.  Patient with generalized abdominal pain.  Concern for PUD.  CT pending.  CT negative for emergent process.  Does show some stool burden.  Will try treatment for PUD.  Increase water and fiber.  GI follow-up.   Roxy Horsemanobert Kalianne Fetting, PA-C 05/28/16 1814    Doug SouSam Jacubowitz, MD 05/28/16 [phone]

## 2016-05-28 NOTE — ED Provider Notes (Signed)
[facility] Provider Note   CSN: [id number] Arrival date & time: 05/28/16  1445     History   Chief Complaint Chief Complaint  Patient presents with  . Melena    HPI [name] [name] is a 60 y.o. female.  HPI   Pt with hx obesity, GERD, HTN, HLD, bipolar disorder p/w intermittent dark tarry stools for several years, constant for the past month, with associated decreased appetite, abdominal bloating, fatigue, increased flatulence and belching.  Pain in her abdomen is in the middle and lower that occurs randomly, not affected by eating.  She is no longer eating due to decreased appetite and is drinking Ensure and Boost.  Has been having burning dysuria x 1 month.  Also has noted some white milky discharge that she has seen in the toilet bowl, she thinks this is from her vagina.  BMs are stringy and dark, tarry.   Has decreased her caffeine intake following internet research suggesting that her symptoms may be related to PUD.   Had colonoscopy by Dr Loreta Ave Oct or Nov 2017 that was negative. Has never had EGD.   PCP Dr Clent Ridges  Past Medical History:  Diagnosis Date  . Allergy    seasonal  . CPAP (continuous positive airway pressure) dependence   . Depression    sees the [facility]   . GERD (gastroesophageal reflux disease)   . Hyperlipidemia   . Hypertension   . Memory loss    short term  . Obesity (BMI 30-39.9) 11/10/2015  . PPD positive 2007 or 2008  . Sleep apnea    on CPAP therapy  . Urinary incontinence   . Vestibular disorder     Patient Active Problem List   Diagnosis Date Noted  . Attention deficit hyperactivity disorder (ADHD) 02/07/2016  . Obesity (BMI 30-39.9) 11/10/2015  . Balance disorder 03/02/2015  . BIPOLAR AFFECTIVE DISORDER 12/13/2009  . ACUTE SINUSITIS, UNSPECIFIED 07/01/2009  . MEMORY LOSS 07/01/2009  . BACK PAIN, LUMBAR 01/21/2009  . EDEMA LEG 01/21/2009  . HIDRADENITIS SUPPURATIVA 10/04/2008  . WEAKNESS 10/04/2008  . DIZZINESS  04/16/2008  . 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 DEHYDRATION 01/29/2008  . CELLULITIS AND ABSCESS OF FACE 01/29/2008  . ANXIETY 01/23/2008  . DEPRESSION 01/23/2008  . Essential hypertension 01/23/2008  . ALLERGIC RHINITIS 01/23/2008  . GERD 01/23/2008  . INSOMNIA 01/23/2008  . URINARY INCONTINENCE 01/23/2008  . POSITIVE PPD 01/23/2008  . HYPERLIPIDEMIA 04/15/2007  . Sleep apnea 04/15/2007  . Headache 04/15/2007    Past Surgical History:  Procedure Laterality Date  . ABDOMINAL HYSTERECTOMY    . ANTERIOR FUSION CERVICAL SPINE  2006   Dr. Wynetta Emery  . BREAST BIOPSY Right 1981   benign  . BREAST SURGERY Bilateral 12-11-12   reductions per Dr. Ivin Booty in [facility]   . COLONOSCOPY  04-23-11   per Dr. Loreta Ave, adenomatous polyps, repeat in 5 yrs   . ESOPHAGOGASTRODUODENOSCOPY  04-23-11   per Dr. Loreta Ave, normal   . LUMBAR DISC SURGERY  2003   Dr. Wynetta Emery  . SPLIT NIGHT STUDY  11/11/2015  . UVULOPALATOPHARYNGOPLASTY    . VAGINAL HYSTERECTOMY  12/96   secondary to fibroids.    OB History    Gravida Para Term Preterm AB Living   2 2 2     2    SAB TAB Ectopic Multiple Live Births                   Home Medications  Prior to Admission medications   Medication Sig Start Date End Date Taking? Authorizing Provider  acyclovir (ZOVIRAX) 400 MG tablet TAKE 1 TABLET BY MOUTH FOUR TIMES A DAY AS NEEDED 01/23/16   Nelwyn SalisburyStephen A Fry, MD  alprazolam Prudy Feeler(XANAX) 2 MG tablet TAKE 1 TABLET BY MOUTH 3 TIMES A DAY AS NEEDED FOR ANXIETY 02/24/16   Nelwyn SalisburyStephen A Fry, MD  amphetamine-dextroamphetamine (ADDERALL XR) 10 MG 24 hr capsule Take 1 capsule (10 mg total) by mouth daily. 03/16/16   Nelwyn SalisburyStephen A Fry, MD  Cholecalciferol (VITAMIN D3) 2000 units capsule Take 2,000 Units by mouth daily.    Historical Provider, MD  CycloSPORINE (RESTASIS OP) Apply 1 drop to eye as directed.    Historical Provider, MD  Garlic 1000 MG CAPS Take 1,000 mg by mouth daily.    Historical Provider, MD  HYDROcodone-acetaminophen (NORCO) 10-325 MG  tablet TAKE 1 TABLET BY MOUTH EVERY 6 HOURS AS NEEDED FOR MODERATE PAIN. (MAY FILL 03/24/16) 04/24/16   Nelwyn SalisburyStephen A Fry, MD  lamoTRIgine (LAMICTAL) 100 MG tablet TAKE 1 TABLET BY MOUTH 2 TIMES A DAY. 03/25/15   Nelwyn SalisburyStephen A Fry, MD  Multiple Vitamin (MULTIVITAMIN) tablet Take 1 tablet by mouth daily.      Historical Provider, MD  Omega-3 Fatty Acids (FISH OIL PO) Take by mouth as directed.    Historical Provider, MD  omeprazole (PRILOSEC) 20 MG capsule Take 1 capsule (20 mg total) by mouth 2 (two) times daily before a meal. 09/27/15   Nelwyn SalisburyStephen A Fry, MD  OVER THE COUNTER MEDICATION Take 3 capsules by mouth every evening. Stool softner    Historical Provider, MD  polyethylene glycol powder (GLYCOLAX/MIRALAX) powder Take 17 g by mouth 2 (two) times daily as needed. 02/10/16   Nelwyn SalisburyStephen A Fry, MD  Probiotic Product (PROBIOTIC PO) Take by mouth daily.    Historical Provider, MD  QUEtiapine (SEROQUEL) 50 MG tablet Take 1 tablet (50 mg total) by mouth at bedtime. 07/05/14   Nelwyn SalisburyStephen A Fry, MD  simvastatin (ZOCOR) 40 MG tablet TAKE 1 TABLET BY MOUTH EVERY NIGHT AT BEDTIME 11/04/15   Nelwyn SalisburyStephen A Fry, MD    Family History Family History  Problem Relation Age of Onset  . Dementia Mother   . Hyperlipidemia Mother   . Asthma Father   . Hypertension Sister   . Hyperlipidemia Sister   . Coronary artery disease Sister   . Thyroid disease Sister   . Hypertension Brother   . Hyperlipidemia Brother   . Coronary artery disease Brother   . Hypertension Sister   . Hyperlipidemia Sister   . Alcohol abuse      fhx  . Arthritis      fhx  . Coronary artery disease      fhx  . Depression      fhx  . Diabetes      fhx  . Hyperlipidemia      fhx  . Hypertension      fhx  . Sudden death      fhx    Social History Social History  Substance Use Topics  . Smoking status: Current Every Day Smoker    Years: 30.00    Types: Cigarettes  . Smokeless tobacco: Never Used  . Alcohol use No     Allergies   Sulfonamide  derivatives; Morphine and related; and Sulfamethoxazole   Review of Systems Review of Systems  All other systems reviewed and are negative.    Physical Exam Updated Vital Signs BP 133/88 (BP  Location: Right Arm)   Pulse 71   Temp 98.4 F (36.9 C) (Oral)   Resp 18   Ht 5\' 2"  (1.575 m)   Wt 74.8 kg   SpO2 99%   BMI 30.18 kg/m   Physical Exam  Constitutional: She appears well-developed and well-nourished. No distress.  HENT:  Head: Normocephalic and atraumatic.  Neck: Neck supple.  Cardiovascular: Normal rate and regular rhythm.   Pulmonary/Chest: Effort normal and breath sounds normal. No respiratory distress. She has no wheezes. She has no rales.  Abdominal: Soft. She exhibits no distension. There is tenderness (diffuse). There is no rebound and no guarding.  Genitourinary:  Genitourinary Comments: S/p hysterectomy.  Vagina without abnormal discharge or bleeding.  Cervix is absent.   Rectal exam without tenderness.  External hemorrhoids present.  Dark green stool on exam.    Neurological: She is alert.  Skin: She is not diaphoretic.  Nursing note and vitals reviewed.    ED Treatments / Results  Labs (all labs ordered are listed, but only abnormal results are displayed) Labs Reviewed  WET PREP, GENITAL - Abnormal; Notable for the following:       Result Value   WBC, Wet Prep HPF POC MODERATE (*)    All other components within normal limits  URINALYSIS, ROUTINE W REFLEX MICROSCOPIC - Abnormal; Notable for the following:    APPearance CLOUDY (*)    All other components within normal limits  URINE CULTURE  COMPREHENSIVE METABOLIC PANEL  LIPASE, BLOOD  CBC WITH DIFFERENTIAL/PLATELET  OCCULT BLOOD X 1 CARD TO LAB, STOOL  RPR  HIV ANTIBODY (ROUTINE TESTING)  GC/CHLAMYDIA PROBE AMP ([place]) NOT AT [facility]    EKG  EKG Interpretation None       Radiology No results found.  Procedures Procedures (including critical care time)  Medications Ordered in  ED Medications  sodium chloride 0.9 % bolus 1,000 mL (not administered)     Initial Impression / Assessment and Plan / ED Course  I have reviewed the triage vital signs and the nursing notes.  Pertinent labs & imaging results that were available during my care of the patient were reviewed by me and considered in my medical decision making (see chart for details).  Clinical Course     Afebrile nontoxic patient with abdominal pain, bloating, decreased appetite, dark tarry stools, fatigue, dysuria, abnormal ?vaginal discharge x 1 month.  Labs unremarkable.  UA does not appear infected.  Pelvic exam is s/p hysterectomy, also unremarkable.  CT abd/pelvis pending at change of shift.  Pt signed out to OGE Energyob Browning, PA-C, pending CT results.  If no emergent/urgent medical condition found on CT, pt may be d/c home with GI (Dr Loreta AveMann) and PCP (Dr Clent RidgesFry) follow up.    Final Clinical Impressions(s) / ED Diagnoses   Final diagnoses:  Generalized abdominal pain    New Prescriptions New Prescriptions   No medications on file     Trixie Dredgemily Deanta Mincey, PA-C 05/28/16 1700    Doug SouSam Jacubowitz, MD 05/28/16 [phone]

## 2016-05-28 NOTE — ED Triage Notes (Signed)
Intermittent "black tarry stools" since 2009-c/o abd pain, bloating, fatigue x 2-3 months-presents to triage in w/c-NAD

## 2016-05-28 NOTE — Discharge Instructions (Signed)
Read the information below.  You may return to the Emergency Department at any time for worsening condition or any new symptoms that concern you.  If you develop high fevers, worsening abdominal pain, uncontrolled vomiting, or are unable to tolerate fluids by mouth, return to the ER for a recheck.   °

## 2016-05-28 NOTE — ED Notes (Addendum)
Pt reports dark tarry stools off and on for awhile but lately every day for 1 month. Reports decrease in appetite. Pt husband reports she has had weight loss.

## 2016-05-29 ENCOUNTER — Telehealth: Payer: Self-pay | Admitting: Family Medicine

## 2016-05-29 DIAGNOSIS — Z86010 Personal history of colonic polyps: Secondary | ICD-10-CM | POA: Diagnosis not present

## 2016-05-29 DIAGNOSIS — R1013 Epigastric pain: Secondary | ICD-10-CM | POA: Diagnosis not present

## 2016-05-29 DIAGNOSIS — K219 Gastro-esophageal reflux disease without esophagitis: Secondary | ICD-10-CM | POA: Diagnosis not present

## 2016-05-29 LAB — HIV ANTIBODY (ROUTINE TESTING W REFLEX): HIV Screen 4th Generation wRfx: NONREACTIVE

## 2016-05-29 LAB — URINE CULTURE: Culture: NO GROWTH

## 2016-05-29 LAB — RPR: RPR Ser Ql: NONREACTIVE

## 2016-05-29 MED ORDER — AMPHETAMINE-DEXTROAMPHET ER 10 MG PO CP24: 10.0000 mg | ORAL_CAPSULE | Freq: Every day | ORAL | 0 refills | Status: DC

## 2016-05-29 NOTE — Telephone Encounter (Signed)
Pt needs new rx  adderall xr 10 mg °

## 2016-05-29 NOTE — Telephone Encounter (Signed)
done

## 2016-05-30 LAB — GC/CHLAMYDIA PROBE AMP (~~LOC~~) NOT AT ARMC
Chlamydia: NEGATIVE
Neisseria Gonorrhea: NEGATIVE

## 2016-05-30 NOTE — Telephone Encounter (Signed)
Script is ready for pick up here at front office and I spoke with pt.  

## 2016-06-06 ENCOUNTER — Emergency Department (HOSPITAL_COMMUNITY): Payer: PPO

## 2016-06-06 ENCOUNTER — Observation Stay (HOSPITAL_COMMUNITY)
Admission: EM | Admit: 2016-06-06 | Discharge: 2016-06-07 | Disposition: A | Discharge status: Home / Self Care | Payer: PPO | Attending: Internal Medicine | Admitting: Internal Medicine

## 2016-06-06 ENCOUNTER — Encounter (HOSPITAL_COMMUNITY): Payer: Self-pay | Admitting: Emergency Medicine

## 2016-06-06 DIAGNOSIS — E784 Other hyperlipidemia: Secondary | ICD-10-CM

## 2016-06-06 DIAGNOSIS — Z79899 Other long term (current) drug therapy: Secondary | ICD-10-CM | POA: Diagnosis not present

## 2016-06-06 DIAGNOSIS — K210 Gastro-esophageal reflux disease with esophagitis: Secondary | ICD-10-CM | POA: Diagnosis not present

## 2016-06-06 DIAGNOSIS — F419 Anxiety disorder, unspecified: Secondary | ICD-10-CM | POA: Diagnosis present

## 2016-06-06 DIAGNOSIS — I209 Angina pectoris, unspecified: Secondary | ICD-10-CM

## 2016-06-06 DIAGNOSIS — R0789 Other chest pain: Secondary | ICD-10-CM | POA: Diagnosis not present

## 2016-06-06 DIAGNOSIS — F411 Generalized anxiety disorder: Secondary | ICD-10-CM | POA: Diagnosis present

## 2016-06-06 DIAGNOSIS — G4730 Sleep apnea, unspecified: Secondary | ICD-10-CM | POA: Diagnosis present

## 2016-06-06 DIAGNOSIS — R079 Chest pain, unspecified: Secondary | ICD-10-CM

## 2016-06-06 DIAGNOSIS — F17210 Nicotine dependence, cigarettes, uncomplicated: Secondary | ICD-10-CM | POA: Diagnosis not present

## 2016-06-06 DIAGNOSIS — K219 Gastro-esophageal reflux disease without esophagitis: Secondary | ICD-10-CM | POA: Diagnosis present

## 2016-06-06 DIAGNOSIS — I10 Essential (primary) hypertension: Secondary | ICD-10-CM | POA: Insufficient documentation

## 2016-06-06 DIAGNOSIS — G4733 Obstructive sleep apnea (adult) (pediatric): Secondary | ICD-10-CM | POA: Diagnosis present

## 2016-06-06 DIAGNOSIS — F319 Bipolar disorder, unspecified: Secondary | ICD-10-CM | POA: Diagnosis present

## 2016-06-06 DIAGNOSIS — K279 Peptic ulcer, site unspecified, unspecified as acute or chronic, without hemorrhage or perforation: Secondary | ICD-10-CM

## 2016-06-06 DIAGNOSIS — E785 Hyperlipidemia, unspecified: Secondary | ICD-10-CM | POA: Diagnosis present

## 2016-06-06 HISTORY — DX: Peptic ulcer, site unspecified, unspecified as acute or chronic, without hemorrhage or perforation: K27.9

## 2016-06-06 HISTORY — DX: Bipolar disorder, unspecified: F31.9

## 2016-06-06 HISTORY — DX: Angina pectoris, unspecified: I20.9

## 2016-06-06 LAB — CBC
HCT: 37.5 % (ref 36.0–46.0)
Hemoglobin: 12.3 g/dL (ref 12.0–15.0)
MCH: 30.3 pg (ref 26.0–34.0)
MCHC: 32.8 g/dL (ref 30.0–36.0)
MCV: 92.4 fL (ref 78.0–100.0)
Platelets: 160 10*3/uL (ref 150–400)
RBC: 4.06 MIL/uL (ref 3.87–5.11)
RDW: 13.3 % (ref 11.5–15.5)
WBC: 7.2 10*3/uL (ref 4.0–10.5)

## 2016-06-06 LAB — BASIC METABOLIC PANEL
Anion gap: 10 (ref 5–15)
BUN: 13 mg/dL (ref 6–20)
CO2: 27 mmol/L (ref 22–32)
Calcium: 9.7 mg/dL (ref 8.9–10.3)
Chloride: 103 mmol/L (ref 101–111)
Creatinine, Ser: 0.64 mg/dL (ref 0.44–1.00)
GFR calc Af Amer: >60 mL/min (ref >60)
GFR calc non Af Amer: >60 mL/min (ref >60)
Glucose, Bld: 113 mg/dL — ABNORMAL HIGH (ref 65–99)
Potassium: 3.8 mmol/L (ref 3.5–5.1)
Sodium: 140 mmol/L (ref 135–145)

## 2016-06-06 LAB — LIPID PANEL
Cholesterol: 187 mg/dL (ref 0–200)
HDL: 51 mg/dL (ref >40)
LDL Cholesterol: 108 mg/dL — ABNORMAL HIGH (ref 0–99)
Total CHOL/HDL Ratio: 3.7 RATIO
Triglycerides: 142 mg/dL (ref <150)
VLDL: 28 mg/dL (ref 0–40)

## 2016-06-06 LAB — TROPONIN I
Troponin I: <0.03 ng/mL (ref <0.03)
Troponin I: <0.03 ng/mL (ref <0.03)
Troponin I: <0.03 ng/mL (ref <0.03)

## 2016-06-06 LAB — CREATININE, SERUM
Creatinine, Ser: 0.66 mg/dL (ref 0.44–1.00)
GFR calc Af Amer: >60 mL/min (ref >60)
GFR calc non Af Amer: >60 mL/min (ref >60)

## 2016-06-06 LAB — I-STAT TROPONIN, ED: Troponin i, poc: 0 ng/mL (ref 0.00–0.08)

## 2016-06-06 MED ORDER — CYCLOSPORINE 0.05 % OP EMUL: 1.0000 [drp] | Freq: Two times a day (BID) | OPHTHALMIC | Status: DC

## 2016-06-06 MED ORDER — QUETIAPINE FUMARATE 50 MG PO TABS
50.0000 mg | ORAL_TABLET | Freq: Every day | ORAL | Status: DC
  Administered 2016-06-06: 50 mg via ORAL
  Filled 2016-06-06: qty 1

## 2016-06-06 MED ORDER — HYDROCODONE-ACETAMINOPHEN 10-325 MG PO TABS
1.0000 | ORAL_TABLET | Freq: Four times a day (QID) | ORAL | Status: DC | PRN
  Administered 2016-06-06: 1 via ORAL
  Filled 2016-06-06: qty 1

## 2016-06-06 MED ORDER — ASPIRIN 81 MG PO CHEW
324.0000 mg | CHEWABLE_TABLET | Freq: Once | ORAL | Status: AC
  Administered 2016-06-06: 324 mg via ORAL
  Filled 2016-06-06: qty 4

## 2016-06-06 MED ORDER — SIMVASTATIN 40 MG PO TABS
40.0000 mg | ORAL_TABLET | Freq: Every day | ORAL | Status: DC
  Administered 2016-06-06: 40 mg via ORAL
  Filled 2016-06-06: qty 1

## 2016-06-06 MED ORDER — SUCRALFATE 1 GM/10ML PO SUSP: 1.0000 g | Freq: Three times a day (TID) | ORAL | Status: DC

## 2016-06-06 MED ORDER — GI COCKTAIL ~~LOC~~
30.0000 mL | Freq: Once | ORAL | Status: AC
  Administered 2016-06-06: 30 mL via ORAL
  Filled 2016-06-06: qty 30

## 2016-06-06 MED ORDER — ONDANSETRON HCL 4 MG/2ML IJ SOLN: 4.0000 mg | Freq: Four times a day (QID) | INTRAMUSCULAR | Status: DC | PRN

## 2016-06-06 MED ORDER — GI COCKTAIL ~~LOC~~
30.0000 mL | Freq: Once | ORAL | Status: AC
Start: 2016-06-06 — End: 2016-06-06
  Administered 2016-06-06: 30 mL via ORAL
  Filled 2016-06-06: qty 30

## 2016-06-06 MED ORDER — POLYETHYLENE GLYCOL 3350 17 G PO PACK: 17.0000 g | PACK | Freq: Two times a day (BID) | ORAL | Status: DC | PRN

## 2016-06-06 MED ORDER — ALPRAZOLAM 0.5 MG PO TABS
1.0000 mg | ORAL_TABLET | Freq: Three times a day (TID) | ORAL | Status: DC | PRN
  Administered 2016-06-07: 1 mg via ORAL
  Filled 2016-06-06: qty 2

## 2016-06-06 MED ORDER — POLYETHYLENE GLYCOL 3350 17 GM/SCOOP PO POWD: 17.0000 g | Freq: Two times a day (BID) | ORAL | Status: DC

## 2016-06-06 MED ORDER — ACETAMINOPHEN 325 MG PO TABS
650.0000 mg | ORAL_TABLET | Freq: Once | ORAL | Status: AC
  Administered 2016-06-06: 650 mg via ORAL
  Filled 2016-06-06: qty 2

## 2016-06-06 MED ORDER — LAMOTRIGINE 100 MG PO TABS
100.0000 mg | ORAL_TABLET | Freq: Two times a day (BID) | ORAL | Status: DC
  Administered 2016-06-06 – 2016-06-07 (×2): 100 mg via ORAL
  Filled 2016-06-06 (×3): qty 1

## 2016-06-06 MED ORDER — NITROGLYCERIN 0.4 MG SL SUBL
0.4000 mg | SUBLINGUAL_TABLET | SUBLINGUAL | Status: DC | PRN
  Administered 2016-06-06: 0.4 mg via SUBLINGUAL
  Filled 2016-06-06: qty 1

## 2016-06-06 MED ORDER — HEPARIN SODIUM (PORCINE) 5000 UNIT/ML IJ SOLN
5000.0000 [IU] | Freq: Three times a day (TID) | INTRAMUSCULAR | Status: DC
  Filled 2016-06-06 (×2): qty 1

## 2016-06-06 MED ORDER — PANTOPRAZOLE SODIUM 40 MG PO TBEC
40.0000 mg | DELAYED_RELEASE_TABLET | Freq: Every day | ORAL | Status: DC
  Administered 2016-06-06 – 2016-06-07 (×2): 40 mg via ORAL
  Filled 2016-06-06 (×2): qty 1

## 2016-06-06 MED ORDER — ACETAMINOPHEN 325 MG PO TABS
650.0000 mg | ORAL_TABLET | ORAL | Status: DC | PRN
  Administered 2016-06-06 – 2016-06-07 (×2): 650 mg via ORAL
  Filled 2016-06-06 (×2): qty 2

## 2016-06-06 NOTE — Progress Notes (Signed)
Patient has home CPAP and mask and doesn't need any assistance with application. RT inspected the machine and there are no frayed wires.  Family member stated that it was a brand new machine. RT will continue to monitor.

## 2016-06-06 NOTE — ED Provider Notes (Signed)
[facility]-EMERGENCY DEPT Provider Note   CSN: [id number] Arrival date & time: 06/06/16  0545     History   Chief Complaint Chief Complaint  Patient presents with  . Chest Pain    HPI [name] [name] is a 60 y.o. female.  HPI   60 year old obese female with history of hypertension, OSA on CPAP, hyperlipidemia, bipolar presents complaining of chest pain. Patient reports the past several months she has had intermittent chest discomfort lasting for minutes to hours. Her pain is mostly nonexertional and sometimes brought on by stress. Last episode was last night around 12 AM while she was watching TV. She describes sensation as a centralized chest pain, radiates to left on, up the right side of her neck and as well as radiates to her back. The pain has been persistent, waxing waning and currently is rating at 6 out of 10. She endorsed diaphoresis, dizziness, and shortness of breath. She attributed pain distress she has been having increased stress as well as several family deaths. With today's episode she did not try anything to make it better also had from taking the hydrocodone which did not provide any significant improvement. She is a smoker, has some history of cardiac disease, and reported remote stress test 20 years ago but none since. She mentioned that she has evidence of severe atherosclerosis based on a CT scan that was obtained recent knee when she was diagnosed with PUD. She was having black tarry stool in the past several years especially within the past month. Patient states that she stop all medication within the past 2 weeks and mentioned that her tarry stool has improved.  She did not report any significant risk factors for PE.  Past Medical History:  Diagnosis Date  . Allergy    seasonal  . Bipolar 1 disorder (HCC)   . CPAP (continuous positive airway pressure) dependence   . [facility]    [facility]   . GERD (gastroesophageal reflux disease)   .  Hyperlipidemia   . Hypertension   . Memory loss    short term  . Obesity (BMI 30-39.9) 11/10/2015  . PPD positive 2007 or 2008  . Sleep apnea    on CPAP therapy  . Urinary incontinence   . Vestibular disorder     Patient Active Problem List   Diagnosis Date Noted  . Attention deficit hyperactivity disorder (ADHD) 02/07/2016  . Obesity (BMI 30-39.9) 11/10/2015  . Balance disorder 03/02/2015  . BIPOLAR AFFECTIVE DISORDER 12/13/2009  . ACUTE SINUSITIS, UNSPECIFIED 07/01/2009  . MEMORY LOSS 07/01/2009  . BACK PAIN, LUMBAR 01/21/2009  . EDEMA LEG 01/21/2009  . HIDRADENITIS SUPPURATIVA 10/04/2008  . WEAKNESS 10/04/2008  . DIZZINESS 04/16/2008  . 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 DEHYDRATION 01/29/2008  . CELLULITIS AND ABSCESS OF FACE 01/29/2008  . ANXIETY 01/23/2008  . DEPRESSION 01/23/2008  . Essential hypertension 01/23/2008  . ALLERGIC RHINITIS 01/23/2008  . GERD 01/23/2008  . INSOMNIA 01/23/2008  . URINARY INCONTINENCE 01/23/2008  . POSITIVE PPD 01/23/2008  . HYPERLIPIDEMIA 04/15/2007  . Sleep apnea 04/15/2007  . Headache 04/15/2007    Past Surgical History:  Procedure Laterality Date  . ABDOMINAL HYSTERECTOMY    . ANTERIOR FUSION CERVICAL SPINE  2006   Dr. Wynetta Emeryram  . BREAST BIOPSY Right 1981   benign  . BREAST SURGERY Bilateral 12-11-12   [place]   . COLONOSCOPY  04-23-11   per Dr. Loreta AveMann, adenomatous polyps, repeat in 5 yrs   .  ESOPHAGOGASTRODUODENOSCOPY  04-23-11   per Dr. Loreta AveMann, normal   . LUMBAR DISC SURGERY  2003   Dr. Wynetta Emeryram  . SPLIT NIGHT STUDY  11/11/2015  . UVULOPALATOPHARYNGOPLASTY    . VAGINAL HYSTERECTOMY  12/96   secondary to fibroids.    OB History    Gravida Para Term Preterm AB Living   2 2 2     2    SAB TAB Ectopic Multiple Live Births                   Home Medications    Prior to Admission medications   Medication Sig Start Date End Date Taking? Authorizing Provider  acyclovir (ZOVIRAX) 400 MG tablet  TAKE 1 TABLET BY MOUTH FOUR TIMES A DAY AS NEEDED 01/23/16   Nelwyn SalisburyStephen A Fry, MD  alprazolam Prudy Feeler(XANAX) 2 MG tablet TAKE 1 TABLET BY MOUTH 3 TIMES A DAY AS NEEDED FOR ANXIETY 02/24/16   Nelwyn SalisburyStephen A Fry, MD  amphetamine-dextroamphetamine (ADDERALL XR) 10 MG 24 hr capsule Take 1 capsule (10 mg total) by mouth daily. 05/29/16   Nelwyn SalisburyStephen A Fry, MD  Cholecalciferol (VITAMIN D3) 2000 units capsule Take 2,000 Units by mouth daily.    Historical Provider, MD  CycloSPORINE (RESTASIS OP) Apply 1 drop to eye as directed.    Historical Provider, MD  Garlic 1000 MG CAPS Take 1,000 mg by mouth daily.    Historical Provider, MD  HYDROcodone-acetaminophen (NORCO) 10-325 MG tablet TAKE 1 TABLET BY MOUTH EVERY 6 HOURS AS NEEDED FOR MODERATE PAIN. (MAY FILL 03/24/16) 04/24/16   Nelwyn SalisburyStephen A Fry, MD  lamoTRIgine (LAMICTAL) 100 MG tablet TAKE 1 TABLET BY MOUTH 2 TIMES A DAY. 03/25/15   Nelwyn SalisburyStephen A Fry, MD  Multiple Vitamin (MULTIVITAMIN) tablet Take 1 tablet by mouth daily.      Historical Provider, MD  Omega-3 Fatty Acids (FISH OIL PO) Take by mouth as directed.    Historical Provider, MD  omeprazole (PRILOSEC) 20 MG capsule Take 1 capsule (20 mg total) by mouth 2 (two) times daily before a meal. 05/28/16   Roxy Horsemanobert Browning, PA-C  OVER THE COUNTER MEDICATION Take 3 capsules by mouth every evening. Stool softner    Historical Provider, MD  polyethylene glycol powder (GLYCOLAX/MIRALAX) powder Take 17 g by mouth 2 (two) times daily as needed. 02/10/16   Nelwyn SalisburyStephen A Fry, MD  Probiotic Product (PROBIOTIC PO) Take by mouth daily.    Historical Provider, MD  QUEtiapine (SEROQUEL) 50 MG tablet Take 1 tablet (50 mg total) by mouth at bedtime. 07/05/14   Nelwyn SalisburyStephen A Fry, MD  simvastatin (ZOCOR) 40 MG tablet TAKE 1 TABLET BY MOUTH EVERY NIGHT AT BEDTIME 11/04/15   Nelwyn SalisburyStephen A Fry, MD  sucralfate (CARAFATE) 1 GM/10ML suspension Take 10 mLs (1 g total) by mouth 4 (four) times daily -  with meals and at bedtime. 05/28/16   Roxy Horsemanobert Browning, PA-C    Family  History Family History  Problem Relation Age of Onset  . Dementia Mother   . Hyperlipidemia Mother   . Asthma Father   . Hypertension Sister   . Hyperlipidemia Sister   . Coronary artery disease Sister   . Thyroid disease Sister   . Hypertension Brother   . Hyperlipidemia Brother   . Coronary artery disease Brother   . Hypertension Sister   . Hyperlipidemia Sister   . Alcohol abuse      fhx  . Arthritis      fhx  . Coronary artery disease  fhx  . Depression      fhx  . Diabetes      fhx  . Hyperlipidemia      fhx  . Hypertension      fhx  . Sudden death      fhx    Social History Social History  Substance Use Topics  . Smoking status: Current Every Day Smoker    Years: 30.00    Types: Cigarettes  . Smokeless tobacco: Never Used  . Alcohol use No     Allergies   Sulfonamide derivatives; Morphine and related; and Sulfamethoxazole   Review of Systems Review of Systems  All other systems reviewed and are negative.    Physical Exam Updated Vital Signs BP 128/82 (BP Location: Right Arm)   Pulse 73   Temp 98.2 F (36.8 C) (Oral)   Resp 18   Ht 5\' 3"  (1.6 m)   Wt 73.5 kg   SpO2 96%   BMI 28.70 kg/m   Physical Exam  Constitutional: She appears well-developed and well-nourished. No distress.  HENT:  Head: Atraumatic.  Eyes: Conjunctivae are normal.  Neck: Neck supple.  No carotid bruit  Cardiovascular: Normal rate, regular rhythm and intact distal pulses.   Pulmonary/Chest: Effort normal and breath sounds normal. No respiratory distress. She has no wheezes. She exhibits tenderness (Mild central chest tenderness on palpation without crepitus or emphysema.).  Abdominal: Soft. There is no tenderness.  Musculoskeletal: She exhibits no edema.  Neurological: She is alert.  Skin: No rash noted.  Psychiatric: She has a normal mood and affect.  Nursing note and vitals reviewed.    ED Treatments / Results  Labs (all labs ordered are listed, but  only abnormal results are displayed) Labs Reviewed  BASIC METABOLIC PANEL - Abnormal; Notable for the following:       Result Value   Glucose, Bld 113 (*)    All other components within normal limits  CBC  I-STAT TROPOININ, ED    EKG  EKG Interpretation  Date/Time:  Wednesday June 06 2016 05:54:24 EST Ventricular Rate:  70 PR Interval:  156 QRS Duration: 90 QT Interval:  414 QTC Calculation: 447 R Axis:   80 Text Interpretation:  Normal sinus rhythm Normal ECG When compared with ECG of 01/03/2015, No significant change was found Confirmed by [facility]  MD, DAVID ([id number]) on 06/06/2016 6:07:01 AM       Radiology Dg Chest 2 View  Result Date: 06/06/2016 CLINICAL DATA:  60 year old female with chest pain EXAM: CHEST  2 VIEW COMPARISON:  Chest radiograph dated 04/13/2010 FINDINGS: The lungs are clear. There is no pleural effusion or pneumothorax. The cardiac silhouette is within normal limits. Lower cervical fixation plate and screws. No acute osseous pathology. IMPRESSION: No active cardiopulmonary disease. Electronically Signed   By: Elgie Collard M.D.   On: 06/06/2016 06:46    Procedures Procedures (including critical care time)  Medications Ordered in ED Medications  nitroGLYCERIN (NITROSTAT) SL tablet 0.4 mg (not administered)  aspirin chewable tablet 324 mg (not administered)     Initial Impression / Assessment and Plan / ED Course  I have reviewed the triage vital signs and the nursing notes.  Pertinent labs & imaging results that were available during my care of the patient were reviewed by me and considered in my medical decision making (see chart for details).  Clinical Course     BP 140/70   Pulse 67   Temp 98.2 F (36.8 C) (Oral)   Resp  14   Ht 5\' 3"  (1.6 m)   Wt 73.5 kg   SpO2 98%   BMI 28.70 kg/m    Final Clinical Impressions(s) / ED Diagnoses   Final diagnoses:  Angina pectoris (HCC)    New Prescriptions New Prescriptions   No  medications on file   6:27 AM Patient presents with central chest pain however has some components suggestive of angina. She has not had a cardiac stress test recently. HEART score of 4, which puts her in a moderate risk category for MACE.  Pt likely benefit cardiac stress test to r/o ACS.    7:56 AM Work up unremarkable.  Care discussed with Dr. Preston Fleeting.  I have consulted [facility] and spoke with PA Kyazimova who agrees to see pt in the ER and will admit to telemetry floor, observation status for CP r/o.     Fayrene Helper, PA-C 06/06/16 0759    Dione Booze, MD 06/06/16 [phone]

## 2016-06-06 NOTE — H&P (Signed)
History and Physical    [name] GibbonsShelia Kay [name] ZOX:[id number] DOB: Dec 08, 1955 DOA: 06/06/2016  PCP: Gershon CraneStephen Fry, MD   Patient coming from: home  Chief Complaint: chest pain  HPI: [name] GibbonsShelia Kay [name] is a 60 y.o. female with medical history significant of hypertension, OSA on CPAP, hyperlipidemia, bipolar disorder peptic ulcer disease with erosive esophagitis and tobacco abuse presents complaining of chest pain. Onset of chest pain approximately 2 weeks ago and has been happening with or without exertion. Patient admits to having associated symptoms such as radiation of pain to the right side of the neck, left shoulder and left arm with numbness of the left hand, also radiates straight to the back. Several admits to having dyspnea especially on exertion and diaphoresis. She complains of severe indigestion for which she takes PPI twice a day and was prescribed sucralfate, but did not finish the whole course of therapy. Last onset of chest pain was at midnight while she was watching TV. It was located centrally in the chest and she felt short of breath and had a forhead covered in beads of sweats. Patient also admits to having constipation and urinary incontinence.  ED Course: On presentation patient had stable vital signs, EKG was unremarkable, troponin I - 0.00. She presented with chest pain 3/10 on the pain scale and was given sublingual nitroglycerin with  resolution of pain   Review of Systems: As per HPI otherwise 10 point review of systems negative.   Ambulatory Status: Lives at home, independent ambulation.  Past Medical History:  Diagnosis Date  . Allergy    seasonal  . Bipolar 1 disorder (HCC)   . CPAP (continuous positive airway pressure) dependence   . Depression    [facility]   . GERD (gastroesophageal reflux disease)   . Hyperlipidemia   . Hypertension   . Memory loss    short term  . Obesity (BMI 30-39.9) 11/10/2015  . PPD positive 2007 or 2008  . Sleep apnea     on CPAP therapy  . Urinary incontinence   . Vestibular disorder     Past Surgical History:  Procedure Laterality Date  . ABDOMINAL HYSTERECTOMY    . ANTERIOR FUSION CERVICAL SPINE  2006   Dr. Wynetta Emeryram  . BREAST BIOPSY Right 1981   benign  . BREAST SURGERY Bilateral 12-11-12   [place] [facility]   . COLONOSCOPY  04-23-11   per Dr. Loreta AveMann, adenomatous polyps, repeat in 5 yrs   . ESOPHAGOGASTRODUODENOSCOPY  04-23-11   per Dr. Loreta AveMann, normal   . LUMBAR DISC SURGERY  2003   Dr. Wynetta Emeryram  . SPLIT NIGHT STUDY  11/11/2015  . UVULOPALATOPHARYNGOPLASTY    . VAGINAL HYSTERECTOMY  12/96   secondary to fibroids.    Social History   Social History  . Marital status: Divorced    Spouse name: N/A  . Number of children: 2  . Years of education: N/A   Occupational History  . Not on file.   Social History Main Topics  . Smoking status: Current Every Day Smoker    Years: 30.00    Types: Cigarettes  . Smokeless tobacco: Never Used  . Alcohol use No  . Drug use: No  . Sexual activity: Not on file   Other Topics Concern  . Not on file   Social History Narrative  . No narrative on file    Allergies  Allergen Reactions  . Sulfonamide Derivatives Other (See Comments)  Facial swelling  . Morphine And Related Nausea And Vomiting  . Sulfamethoxazole Other (See Comments)    unknown    Family History  Problem Relation Age of Onset  . Dementia Mother   . Hyperlipidemia Mother   . Asthma Father   . Hypertension Sister   . Hyperlipidemia Sister   . Coronary artery disease Sister   . Thyroid disease Sister   . Hypertension Brother   . Hyperlipidemia Brother   . Coronary artery disease Brother   . Hypertension Sister   . Hyperlipidemia Sister   . Alcohol abuse      fhx  . Arthritis      fhx  . Coronary artery disease      fhx  . Depression      fhx  . Diabetes      fhx  . Hyperlipidemia      fhx  . Hypertension      fhx  . Sudden death       fhx    Prior to Admission medications   Medication Sig Start Date End Date Taking? Authorizing Provider  acyclovir (ZOVIRAX) 400 MG tablet TAKE 1 TABLET BY MOUTH FOUR TIMES A DAY AS NEEDED 01/23/16   Nelwyn Salisbury, MD  alprazolam Prudy Feeler) 2 MG tablet TAKE 1 TABLET BY MOUTH 3 TIMES A DAY AS NEEDED FOR ANXIETY 02/24/16   Nelwyn Salisbury, MD  amphetamine-dextroamphetamine (ADDERALL XR) 10 MG 24 hr capsule Take 1 capsule (10 mg total) by mouth daily. 05/29/16   Nelwyn Salisbury, MD  Cholecalciferol (VITAMIN D3) 2000 units capsule Take 2,000 Units by mouth daily.    Historical Provider, MD  CycloSPORINE (RESTASIS OP) Apply 1 drop to eye as directed.    Historical Provider, MD  Garlic 1000 MG CAPS Take 1,000 mg by mouth daily.    Historical Provider, MD  HYDROcodone-acetaminophen (NORCO) 10-325 MG tablet TAKE 1 TABLET BY MOUTH EVERY 6 HOURS AS NEEDED FOR MODERATE PAIN. (MAY FILL 03/24/16) 04/24/16   Nelwyn Salisbury, MD  lamoTRIgine (LAMICTAL) 100 MG tablet TAKE 1 TABLET BY MOUTH 2 TIMES A DAY. 03/25/15   Nelwyn Salisbury, MD  Multiple Vitamin (MULTIVITAMIN) tablet Take 1 tablet by mouth daily.      Historical Provider, MD  Omega-3 Fatty Acids (FISH OIL PO) Take by mouth as directed.    Historical Provider, MD  omeprazole (PRILOSEC) 20 MG capsule Take 1 capsule (20 mg total) by mouth 2 (two) times daily before a meal. 05/28/16   Roxy Horseman, PA-C  OVER THE COUNTER MEDICATION Take 3 capsules by mouth every evening. Stool softner    Historical Provider, MD  polyethylene glycol powder (GLYCOLAX/MIRALAX) powder Take 17 g by mouth 2 (two) times daily as needed. 02/10/16   Nelwyn Salisbury, MD  Probiotic Product (PROBIOTIC PO) Take by mouth daily.    Historical Provider, MD  QUEtiapine (SEROQUEL) 50 MG tablet Take 1 tablet (50 mg total) by mouth at bedtime. 07/05/14   Nelwyn Salisbury, MD  simvastatin (ZOCOR) 40 MG tablet TAKE 1 TABLET BY MOUTH EVERY NIGHT AT BEDTIME 11/04/15   Nelwyn Salisbury, MD  sucralfate (CARAFATE) 1 GM/10ML  suspension Take 10 mLs (1 g total) by mouth 4 (four) times daily -  with meals and at bedtime. 05/28/16   Roxy Horseman, PA-C    Physical Exam: Vitals:   06/06/16 0825 06/06/16 0830 06/06/16 0900 06/06/16 0930  BP: 127/86 126/70 127/65 126/77  Pulse: 63 71 64 (!) 58  Resp: 18 18 13 16   Temp:      TempSrc:      SpO2: 98% 99% 98% 98%  Weight:      Height:         General: Appears calm and comfortable Eyes: PERRLA, EOMI, normal lids, iris ENT:  grossly normal hearing, lips & tongue, mucous membranes moist and intact Neck: no lymphoadenopathy, masses or thyromegaly Cardiovascular: RRR, no m/r/g. No JVD, carotid bruits. No LE edema.  Respiratory: CTA bilaterally, no wheezes, rales, rhonchi or cracles. Normal respiratory effort. No accessory muscle use observed Abdomen: soft, mildly tender, non-distended, no organomegaly or masses appreciated. BS present in all quadrants Skin: no rash, ulcers or induration seen on limited exam Musculoskeletal: grossly normal tone BUE/BLE, good ROM, no bony abnormality or joint deformities observed Psychiatric: grossly normal mood and affect, speech fluent and appropriate, alert and oriented x3 Neurologic: CN II-XII grossly intact, moves all extremities in coordinated fashion, sensation intact  Labs on Admission: I have personally reviewed following labs and imaging studies  CBC, BMP, Troponin  GFR: Estimated Creatinine Clearance: 71.8 mL/min (by C-G formula based on SCr of 0.64 mg/dL).   Radiological Exams on Admission: Dg Chest 2 View  Result Date: 06/06/2016 CLINICAL DATA:  60 year old female with chest pain EXAM: CHEST  2 VIEW COMPARISON:  Chest radiograph dated 04/13/2010 FINDINGS: The lungs are clear. There is no pleural effusion or pneumothorax. The cardiac silhouette is within normal limits. Lower cervical fixation plate and screws. No acute osseous pathology. IMPRESSION: No active cardiopulmonary disease. Electronically Signed   By: Elgie CollardArash   Radparvar M.D.   On: 06/06/2016 06:46    EKG: Independently reviewed - sinus rhythm, no acute ST-TW changes  Assessment/Plan Principal Problem:   Chest pain Active Problems:   Hyperlipidemia   Bipolar disorder (HCC)   Anxiety   Essential hypertension   GERD   Sleep apnea   PUD (peptic ulcer disease)   Chest pain Patient has heart score 5, reflecting her age and risk factors and family history She's being admitted for observation to telemetry unit, continue cycling cardiac enzymes, NTG prn If enzymes remain normal and telemetry doesn't acute changes, patient potentially could be discharged home with plan to follow up with cardiology in the office  Hypertension Currently controlled wwithout medications, continue to monitor and treat if needed  Hyperlipidemia Continue Zocor, check fasting lipid profile and a chest dose of Zocor if needed  PUD Patient was recently diagnosed with peptic ulcer disease, continue PPI twice a day and sucralfate.  Obstructive sleep apnea Continue CPAP at night which was ordered via respiratory therapy    DVT prophylaxis: Heparin Code Status: Full Family Communication: Patient and his significant other at bedside Disposition Plan: Telemetry unit Consults called: None Admission status: Observation   Raymon MuttonMarina Khaleem Burchill, [place]PA-C Pager: [phone] [facility]  If 7PM-7AM, please contact night-coverage [email or web address] Password [email or web address]  06/06/2016, 9:42 AM

## 2016-06-06 NOTE — ED Notes (Signed)
Called in a Heart Healthy Diet for Patient.

## 2016-06-06 NOTE — ED Notes (Signed)
Patient transported to X-ray 

## 2016-06-06 NOTE — ED Triage Notes (Addendum)
Patient reports central chest pain ( tightness/pressure) radiating to right neck/ left arm and mid back with SOB , denies emesis or diaphoresis onset last night.  Pt. added bilateral nipples pain onset this week .

## 2016-06-06 NOTE — ED Notes (Signed)
Patient was given graham Crackers and Peanut Butter and a Sprite wit Ice.

## 2016-06-07 ENCOUNTER — Observation Stay (HOSPITAL_COMMUNITY): Payer: PPO

## 2016-06-07 ENCOUNTER — Encounter (HOSPITAL_COMMUNITY): Payer: Self-pay | Admitting: Physician Assistant

## 2016-06-07 ENCOUNTER — Ambulatory Visit: Payer: Self-pay | Admitting: Family Medicine

## 2016-06-07 DIAGNOSIS — I209 Angina pectoris, unspecified: Secondary | ICD-10-CM | POA: Diagnosis not present

## 2016-06-07 DIAGNOSIS — R0789 Other chest pain: Secondary | ICD-10-CM

## 2016-06-07 DIAGNOSIS — I200 Unstable angina: Secondary | ICD-10-CM | POA: Diagnosis not present

## 2016-06-07 DIAGNOSIS — F310 Bipolar disorder, current episode hypomanic: Secondary | ICD-10-CM | POA: Diagnosis not present

## 2016-06-07 LAB — EXERCISE TOLERANCE TEST
Estimated workload: 4.6 METS
Exercise duration (min): 2 min
Exercise duration (sec): 54 s
MPHR: 160 {beats}/min
Peak HR: 155 {beats}/min
Percent HR: 95 %
RPE: 18
Rest HR: 91 {beats}/min

## 2016-06-07 MED ORDER — POLYETHYLENE GLYCOL 3350 17 G PO PACK
17.0000 g | PACK | Freq: Every day | ORAL | Status: DC
  Administered 2016-06-07: 17 g via ORAL
  Filled 2016-06-07: qty 1

## 2016-06-07 NOTE — Progress Notes (Signed)
Spoke with [name] Demarkhonda Barrett PA/ [name] [name]. Stated that patient can be discharged from cardiac standpoint. Made Dr. Susie CassetteAbrol aware. Pt requesting to be discharged. Emelda Brothershristy Evola Hollis RN

## 2016-06-07 NOTE — Consult Note (Signed)
CARDIOLOGY CONSULT NOTE   Patient ID: [name] GibbonsShelia Kay Matusik MRN: [id number] DOB/AGE: 11/19/1955 60 y.o.  Admit date: 06/06/2016  Primary Physician   Gershon CraneStephen Fry, MD Primary Cardiologist   Dr Mayford Knifeurner Reason for Consultation   Chest pain Requesting MD: Dr Konrad DoloresMerrell  NWG:[id number]:Horace Laqueta CarinaKay Skog is a 60 y.o. year old female with a history of OSA on CPAP, GERD, HTN, HLD, bipolar, obesity.   Pt admitted with chest pain, cards asked to see.   Pt describes chest pressure, going through to her back. Less severe episodes going back 2 months, yesterday was more severe and she came to the ER. Episodes happening 3-4 x week. 5/10 at its worst. Did not seek help.  Pt had been running errands 12/19, then was unable to go to sleep. The chest pressure started and she got up to walk around because she could not sleep. The anxiety and pressure got worse, and it scared her so she came to the ER early on 12/20. The pressure was central and her L breast hurt, including the nipple. She got several medications, but the GI cocktail relieved her symptoms.   She has had problems with constipation and ? peptic ulcer. ER visit on 12/11 w/ constipation and abd pain.    She has had several deaths in the family and some other family issues in the last 9 months. She feels that her stress level has been much higher than usual.   Past Medical History:  Diagnosis Date  . Allergy    seasonal  . Bipolar 1 disorder (HCC)   . CPAP (continuous positive airway pressure) dependence   . Depression    [facility]   . GERD (gastroesophageal reflux disease)   . Hyperlipidemia   . Hypertension   . Memory loss    short term  . Obesity (BMI 30-39.9) 11/10/2015  . PPD positive 2007 or 2008  . Sleep apnea    on CPAP therapy  . Urinary incontinence   . Vestibular disorder      Past Surgical History:  Procedure Laterality Date  . ABDOMINAL HYSTERECTOMY    . ANTERIOR FUSION CERVICAL SPINE  2006   Dr. Wynetta Emeryram  .  BREAST BIOPSY Right 1981   benign  . BREAST SURGERY Bilateral 12-11-12   reductions per Dr. Ivin [facility]gil [facility]   . COLONOSCOPY  04-23-11   per Dr. Loreta AveMann, adenomatous polyps, repeat in 5 yrs   . ESOPHAGOGASTRODUODENOSCOPY  04-23-11   per Dr. Loreta AveMann, normal   . LUMBAR DISC SURGERY  2003   Dr. Wynetta Emeryram  . SPLIT NIGHT STUDY  11/11/2015  . UVULOPALATOPHARYNGOPLASTY    . VAGINAL HYSTERECTOMY  12/96   secondary to fibroids.    Allergies  Allergen Reactions  . Sulfonamide Derivatives Other (See Comments)    Facial swelling  . Morphine And Related Nausea And Vomiting  . Sulfamethoxazole Other (See Comments)    unknown    I have reviewed the patient's current medications . heparin  5,000 Units Subcutaneous Q8H  . lamoTRIgine  100 mg Oral BID  . pantoprazole  40 mg Oral Daily  . QUEtiapine  50 mg Oral QHS  . simvastatin  40 mg Oral QHS    acetaminophen, alprazolam, HYDROcodone-acetaminophen, nitroGLYCERIN, ondansetron (ZOFRAN) IV, polyethylene glycol  Prior to Admission medications   Medication Sig Start Date End Date Taking? Authorizing Provider  acyclovir (ZOVIRAX) 400 MG tablet TAKE 1 TABLET BY MOUTH FOUR TIMES A DAY AS NEEDED Patient  taking differently: TAKE 1 TABLET BY MOUTH FOUR TIMES A DAY AS NEEDED FOR COLD SORES 01/23/16  Yes Nelwyn Salisbury, MD  alprazolam Prudy Feeler) 2 MG tablet TAKE 1 TABLET BY MOUTH 3 TIMES A DAY AS NEEDED FOR ANXIETY 02/24/16  Yes Nelwyn Salisbury, MD  amphetamine-dextroamphetamine (ADDERALL XR) 10 MG 24 hr capsule Take 1 capsule (10 mg total) by mouth daily. 05/29/16  Yes Nelwyn Salisbury, MD  Cholecalciferol (VITAMIN D3) 2000 units capsule Take 2,000 Units by mouth daily.   Yes Historical Provider, MD  cycloSPORINE (RESTASIS) 0.05 % ophthalmic emulsion Place 1 drop into both eyes every other day.   Yes Historical Provider, MD  Garlic 1000 MG CAPS Take 1,000 mg by mouth daily.   Yes Historical Provider, MD  Ginger, Zingiber officinalis, (GINGER ROOT) 550 MG CAPS Take 1  capsule by mouth daily.   Yes Historical Provider, MD  HYDROcodone-acetaminophen (NORCO) 10-325 MG tablet TAKE 1 TABLET BY MOUTH EVERY 6 HOURS AS NEEDED FOR MODERATE PAIN. (MAY FILL 03/24/16) 04/24/16  Yes Nelwyn Salisbury, MD  lamoTRIgine (LAMICTAL) 100 MG tablet TAKE 1 TABLET BY MOUTH 2 TIMES A DAY. Patient taking differently: TAKE 1 TABLET BY MOUTH AT BEDTIME 03/25/15  Yes Nelwyn Salisbury, MD  Multiple Vitamin (MULTIVITAMIN) tablet Take 1 tablet by mouth daily.     Yes Historical Provider, MD  omeprazole (PRILOSEC) 20 MG capsule Take 1 capsule (20 mg total) by mouth 2 (two) times daily before a meal. Patient taking differently: Take 20 mg by mouth daily.  05/28/16  Yes Roxy Horseman, PA-C  polyethylene glycol powder (GLYCOLAX/MIRALAX) powder Take 17 g by mouth 2 (two) times daily as needed. Patient taking differently: Take 17 g by mouth 2 (two) times daily as needed for mild constipation.  02/10/16  Yes Nelwyn Salisbury, MD  Probiotic Product (PROBIOTIC PO) Take by mouth daily.   Yes Historical Provider, MD  QUEtiapine (SEROQUEL) 50 MG tablet Take 1 tablet (50 mg total) by mouth at bedtime. 07/05/14  Yes Nelwyn Salisbury, MD  Saline (SIMPLY SALINE) 0.9 % AERS Place 1 spray into both nostrils daily as needed.   Yes Historical Provider, MD  simvastatin (ZOCOR) 40 MG tablet TAKE 1 TABLET BY MOUTH EVERY NIGHT AT BEDTIME 11/04/15  Yes Nelwyn Salisbury, MD     Social History   Social History  . Marital status: Divorced    Spouse name: N/A  . Number of children: 2  . Years of education: N/A   Occupational History  . Disabled    Social History Main Topics  . Smoking status: Current Every Day Smoker    Packs/day: 0.25    Years: 30.00    Types: Cigarettes  . Smokeless tobacco: Never Used  . Alcohol use No  . Drug use: No  . Sexual activity: Not on file   Other Topics Concern  . Not on file   Social History Narrative   Pt lives in [place], [place].    Family Status  Relation Status  . Mother Alive  .  Father Deceased at age 79   Asthma and ETOH  . Sister Alive  . Brother Alive  . Sister Alive  .     Family History  Problem Relation Age of Onset  . Dementia Mother   . Hyperlipidemia Mother   . Asthma Father   . Hypertension Sister   . Hyperlipidemia Sister   . Coronary artery disease Sister   . Thyroid disease Sister   .  Hypertension Brother   . Hyperlipidemia Brother   . Coronary artery disease Brother   . Hypertension Sister   . Hyperlipidemia Sister   . Alcohol abuse      fhx  . Arthritis      fhx  . Coronary artery disease      fhx  . Depression      fhx  . Diabetes      fhx  . Hyperlipidemia      fhx  . Hypertension      fhx  . Sudden death      fhx     ROS:  Full 14 point review of systems complete and found to be negative unless listed above.  Physical Exam: Blood pressure 122/68, pulse 66, temperature 97.6 F (36.4 C), temperature source Oral, resp. rate 18, height 5\' 3"  (1.6 m), weight 161 lb 1.6 oz (73.1 kg), SpO2 98 %.  General: Well developed, well nourished, female in no acute distress Head: Eyes PERRLA, No xanthomas.   Normocephalic and atraumatic, oropharynx without edema or exudate. Dentition: good Lungs: few scattered rales. Heart: HRRR S1 S2, no rub/gallop, no murmur. pulses are 2+ all 4 extrem.   Neck: No carotid bruits. No lymphadenopathy.  JVD not elevated Abdomen: Bowel sounds present, abdomen soft and non-tender without masses or hernias noted. Msk:  No spine or cva tenderness. No weakness, no joint deformities or effusions. Extremities: No clubbing or cyanosis. No edema.  Neuro: Alert and oriented X 3. No focal deficits noted. Psych:  Good affect, responds appropriately Skin: No rashes or lesions noted.  Labs:   Lab Results  Component Value Date   WBC 7.2 06/06/2016   HGB 12.3 06/06/2016   HCT 37.5 06/06/2016   MCV 92.4 06/06/2016   PLT 160 06/06/2016     Recent Labs Lab 06/06/16 0558 06/06/16 1113  NA 140  --   K 3.8   --   CL 103  --   CO2 27  --   BUN 13  --   CREATININE 0.64 0.66  CALCIUM 9.7  --   GLUCOSE 113*  --     Recent Labs  06/06/16 1113 06/06/16 1547 06/06/16 1905  TROPONINI <0.03 <0.03 <0.03    Recent Labs  06/06/16 0608  TROPIPOC 0.00   No results found for: BNP Lab Results  Component Value Date   CHOL 187 06/06/2016   HDL 51 06/06/2016   LDLCALC 108 (H) 06/06/2016   TRIG 142 06/06/2016   Lipase  Date/Time Value Ref Range Status  05/28/2016 03:36 PM 17 11 - 51 U/L Final   Echo: n/a  ECG:  12/20 SR, no acute changes, no Q waves, normal intervals  Cath: n/a  Radiology:  Dg Chest 2 View  Result Date: 06/06/2016 CLINICAL DATA:  60 year old female with chest pain EXAM: CHEST  2 VIEW COMPARISON:  Chest radiograph dated 04/13/2010 FINDINGS: The lungs are clear. There is no pleural effusion or pneumothorax. The cardiac silhouette is within normal limits. Lower cervical fixation plate and screws. No acute osseous pathology. IMPRESSION: No active cardiopulmonary disease. Electronically Signed   By: Elgie Collard M.D.   On: 06/06/2016 06:46    ASSESSMENT AND PLAN:   The patient was seen today by Dr Eden Emms, the patient evaluated and the data reviewed.   Principal Problem: 1.  Chest pain - sx atypical, not exertional and were prolonged without ez elevations or ECG changes - however, pt is likely to have recurrent pain, ischemic eval needed -  MD advise on cardiac CT vs MV  Otherwise, per IM Active Problems:   Hyperlipidemia   Bipolar disorder [facility](HCC)   Anxiety   Essential hypertension   GERD   Sleep apnea   PUD (peptic ulcer disease)   Signed: Theodore DemarkBarrett, Rhonda, PA-C 06/07/2016 7:19 AM Beeper [phone]  Co-Sign MD   Patient examined chart reviewed. Atypical chest pain r/o normal ECG Very anxious. Does have some "vestibular" disease And back pain but thinks she can walk on treadmill.  Exam benign clear lungs no murmur no edema Discussed with patient Will  order POET and if negative she can be d/c by primary service latter today   Charlton HawsPeter Renly Roots

## 2016-06-07 NOTE — Plan of Care (Signed)
Problem: Education: Goal: Knowledge of [facility] Education information/materials will improve Outcome: Progressing Patient aware of plan of care.  RN reviewed all medications with patient prior to administration.

## 2016-06-07 NOTE — Discharge Summary (Addendum)
Physician Discharge Summary  [name] [name] MRN: [id number] DOB/AGE: 1955/12/27 60 y.o.  PCP: Alysia Penna, MD   Admit date: 06/06/2016 Discharge date: 06/07/2016  Discharge Diagnoses:    Principal Problem:   Chest pain Active Problems:   Hyperlipidemia   Bipolar disorder ([place])   Anxiety   Essential hypertension   GERD   Sleep apnea   PUD (peptic ulcer disease)    Follow-up recommendations Follow-up with PCP in 3-5 days , including all  additional recommended appointments as below Follow-up CBC, CMP in 3-5 days       Current Discharge Medication List    CONTINUE these medications which have NOT CHANGED   Details  acyclovir (ZOVIRAX) 400 MG tablet TAKE 1 TABLET BY MOUTH FOUR TIMES A DAY AS NEEDED Qty: 120 tablet, Refills: 1    alprazolam (XANAX) 2 MG tablet TAKE 1 TABLET BY MOUTH 3 TIMES A DAY AS NEEDED FOR ANXIETY Qty: 90 tablet, Refills: 5    amphetamine-dextroamphetamine (ADDERALL XR) 10 MG 24 hr capsule Take 1 capsule (10 mg total) by mouth daily. Qty: 30 capsule, Refills: 0    Cholecalciferol (VITAMIN D3) 2000 units capsule Take 2,000 Units by mouth daily.    cycloSPORINE (RESTASIS) 0.05 % ophthalmic emulsion Place 1 drop into both eyes every other day.    Garlic 5852 MG CAPS Take 1,000 mg by mouth daily.    Ginger, Zingiber officinalis, (GINGER ROOT) 550 MG CAPS Take 1 capsule by mouth daily.    HYDROcodone-acetaminophen (NORCO) 10-325 MG tablet TAKE 1 TABLET BY MOUTH EVERY 6 HOURS AS NEEDED FOR MODERATE PAIN. (MAY FILL 03/24/16) Qty: 120 tablet, Refills: 0    lamoTRIgine (LAMICTAL) 100 MG tablet TAKE 1 TABLET BY MOUTH 2 TIMES A DAY. Qty: 60 tablet, Refills: 11    Multiple Vitamin (MULTIVITAMIN) tablet Take 1 tablet by mouth daily.      omeprazole (PRILOSEC) 20 MG capsule Take 1 capsule (20 mg total) by mouth 2 (two) times daily before a meal. Qty: 60 capsule, Refills: 6    polyethylene glycol powder (GLYCOLAX/MIRALAX) powder Take 17 g by mouth  2 (two) times daily as needed. Qty: 3350 g, Refills: 11    Probiotic Product (PROBIOTIC PO) Take by mouth daily.    QUEtiapine (SEROQUEL) 50 MG tablet Take 1 tablet (50 mg total) by mouth at bedtime. Qty: 90 tablet, Refills: 1    Saline (SIMPLY SALINE) 0.9 % AERS Place 1 spray into both nostrils daily as needed.    simvastatin (ZOCOR) 40 MG tablet TAKE 1 TABLET BY MOUTH EVERY NIGHT AT BEDTIME Qty: 90 tablet, Refills: 2         Discharge Condition:  Stable   Discharge Instructions Get Medicines reviewed and adjusted: Please take all your medications with you for your next visit with your Primary MD  Please request your Primary MD to go over all hospital tests and procedure/radiological results at the follow up, please ask your Primary MD to get all Hospital records sent to his/her office.  If you experience worsening of your admission symptoms, develop shortness of breath, life threatening emergency, suicidal or homicidal thoughts you must seek medical attention immediately by calling 911 or calling your MD immediately if symptoms less severe.  You must read complete instructions/literature along with all the possible adverse reactions/side effects for all the Medicines you take and that have been prescribed to you. Take any new Medicines after you have completely understood and accpet all the possible adverse reactions/side effects.   Do  not drive when taking Pain medications.   Do not take more than prescribed Pain, Sleep and Anxiety Medications  Special Instructions: If you have smoked or chewed Tobacco in the last 2 yrs please stop smoking, stop any regular Alcohol and or any Recreational drug use.  Wear Seat belts while driving.  Please note  You were cared for by a hospitalist during your hospital stay. Once you are discharged, your primary care physician will handle any further medical issues. Please note that NO REFILLS for any discharge medications will be  authorized once you are discharged, as it is imperative that you return to your primary care physician (or establish a relationship with a primary care physician if you do not have one) for your aftercare needs so that they can reassess your need for medications and monitor your lab values.  Discharge Instructions    Diet - low sodium heart healthy    Complete by:  As directed    Increase activity slowly    Complete by:  As directed        Allergies  Allergen Reactions  . Sulfonamide Derivatives Other (See Comments)    Facial swelling  . Morphine And Related Nausea And Vomiting  . Sulfamethoxazole Other (See Comments)    unknown      Disposition: 01-Home or Self Care   Consults: * Cardiology    Significant Diagnostic Studies:  Dg Chest 2 View  Result Date: 06/06/2016 CLINICAL DATA:  60 year old female with chest pain EXAM: CHEST  2 VIEW COMPARISON:  Chest radiograph dated 04/13/2010 FINDINGS: The lungs are clear. There is no pleural effusion or pneumothorax. The cardiac silhouette is within normal limits. Lower cervical fixation plate and screws. No acute osseous pathology. IMPRESSION: No active cardiopulmonary disease. Electronically Signed   By: Anner Crete M.D.   On: 06/06/2016 06:46   Ct Abdomen Pelvis W Contrast  Result Date: 05/28/2016 CLINICAL DATA:  Chronic melena, constant for 1 month. Abdominal bloating, fatigue, decreased appetite. Intermittent mid and lower abdominal pain. Status post hysterectomy. EXAM: CT ABDOMEN AND PELVIS WITH CONTRAST TECHNIQUE: Multidetector CT imaging of the abdomen and pelvis was performed using the standard protocol following bolus administration of intravenous contrast. CONTRAST:  148m ISOVUE-300 IOPAMIDOL (ISOVUE-300) INJECTION 61% COMPARISON:  MRI lumbar spine July 24, 2013 FINDINGS: LOWER CHEST: RIGHT greater than LEFT dependent atelectasis. Included heart size is normal. Mild coronary artery calcifications. No pericardial  effusion. HEPATOBILIARY: Liver and gallbladder are normal. PANCREAS: Normal. SPLEEN: Normal. ADRENALS/URINARY TRACT: Kidneys are orthotopic, demonstrating symmetric enhancement. No nephrolithiasis, hydronephrosis or solid renal masses. The unopacified ureters are normal in course and caliber. Delayed imaging through the kidneys demonstrates symmetric prompt contrast excretion within the proximal urinary collecting system. Urinary bladder is partially distended and unremarkable. Normal adrenal glands. STOMACH/BOWEL: Moderate amount of retained large bowel stool. Decompressed RIGHT colon limits assessment. The stomach, small and large bowel are normal in course and caliber without inflammatory changes. Normal appendix. VASCULAR/LYMPHATIC: Aortoiliac vessels are normal in course and caliber, severe calcific atherosclerosis. No lymphadenopathy by CT size criteria. REPRODUCTIVE: Status post hysterectomy. OTHER: No intraperitoneal free fluid or free air. MUSCULOSKELETAL: Nonacute. Severe L5-S1 degenerative disc and severe facet arthropathy resulting and moderate to severe LEFT L5-S1 neural foraminal narrowing, unchanged. Mild degenerative change of the hips with focal RIGHT femoral head sclerosis. IMPRESSION: Moderate retained large bowel stool without bowel obstruction nor acute intra- abdominal/pelvic process. Severe atherosclerosis. Electronically Signed   By: CElon AlasM.D.   On: 05/28/2016 17:38  Filed Weights   06/06/16 0551 06/06/16 1550 06/07/16 0500  Weight: 73.5 kg (162 lb) 74.4 kg (164 lb) 73.1 kg (161 lb 1.6 oz)     Microbiology: No results found for this or any previous visit (from the past 240 hour(s)).     Blood Culture    Component Value Date/Time   SDES URINE, RANDOM 05/28/2016 1536   SPECREQUEST NONE 05/28/2016 1536   CULT NO GROWTH Performed at [facility]  05/28/2016 1536   REPTSTATUS 05/29/2016 FINAL 05/28/2016 1536      Labs: Results for orders  placed or performed during the hospital encounter of 06/06/16 (from the past 48 hour(s))  Basic metabolic panel     Status: Abnormal   Collection Time: 06/06/16  5:58 AM  Result Value Ref Range   Sodium 140 135 - 145 mmol/L   Potassium 3.8 3.5 - 5.1 mmol/L   Chloride 103 101 - 111 mmol/L   CO2 27 22 - 32 mmol/L   Glucose, Bld 113 (H) 65 - 99 mg/dL   BUN 13 6 - 20 mg/dL   Creatinine, Ser 0.64 0.44 - 1.00 mg/dL   Calcium 9.7 8.9 - 10.3 mg/dL   GFR calc non Af Amer >60 >60 mL/min   GFR calc Af Amer >60 >60 mL/min    Comment: (NOTE) The eGFR has been calculated using the CKD EPI equation. This calculation has not been validated in all clinical situations. eGFR's persistently <60 mL/min signify possible Chronic Kidney Disease.    Anion gap 10 5 - 15  CBC     Status: None   Collection Time: 06/06/16  5:58 AM  Result Value Ref Range   WBC 7.2 4.0 - 10.5 K/uL   RBC 4.06 3.87 - 5.11 MIL/uL   Hemoglobin 12.3 12.0 - 15.0 g/dL   HCT 37.5 36.0 - 46.0 %   MCV 92.4 78.0 - 100.0 fL   MCH 30.3 26.0 - 34.0 pg   MCHC 32.8 30.0 - 36.0 g/dL   RDW 13.3 11.5 - 15.5 %   Platelets 160 150 - 400 K/uL  I-stat troponin, ED     Status: None   Collection Time: 06/06/16  6:08 AM  Result Value Ref Range   Troponin i, poc 0.00 0.00 - 0.08 ng/mL   Comment 3            Comment: Due to the release kinetics of cTnI, a negative result within the first hours of the onset of symptoms does not rule out myocardial infarction with certainty. If myocardial infarction is still suspected, repeat the test at appropriate intervals.   Troponin I-serum (0, 3, 6 hours)     Status: None   Collection Time: 06/06/16 11:13 AM  Result Value Ref Range   Troponin I <0.03 <0.03 ng/mL  Creatinine, serum     Status: None   Collection Time: 06/06/16 11:13 AM  Result Value Ref Range   Creatinine, Ser 0.66 0.44 - 1.00 mg/dL   GFR calc non Af Amer >60 >60 mL/min   GFR calc Af Amer >60 >60 mL/min    Comment: (NOTE) The eGFR  has been calculated using the CKD EPI equation. This calculation has not been validated in all clinical situations. eGFR's persistently <60 mL/min signify possible Chronic Kidney Disease.   Lipid panel     Status: Abnormal   Collection Time: 06/06/16 11:13 AM  Result Value Ref Range   Cholesterol 187 0 - 200 mg/dL   Triglycerides 142 <150 mg/dL  HDL 51 >40 mg/dL   Total CHOL/HDL Ratio 3.7 RATIO   VLDL 28 0 - 40 mg/dL   LDL Cholesterol 108 (H) 0 - 99 mg/dL    Comment:        Total Cholesterol/HDL:CHD Risk Coronary Heart Disease Risk Table                     Men   Women  1/2 Average Risk   3.4   3.3  Average Risk       5.0   4.4  2 X Average Risk   9.6   7.1  3 X Average Risk  23.4   11.0        Use the calculated Patient Ratio above and the CHD Risk Table to determine the patient's CHD Risk.        ATP III CLASSIFICATION (LDL):  <100     mg/dL   Optimal  100-129  mg/dL   Near or Above                    Optimal  130-159  mg/dL   Borderline  160-189  mg/dL   High  >190     mg/dL   Very High   Troponin I-serum (0, 3, 6 hours)     Status: None   Collection Time: 06/06/16  3:47 PM  Result Value Ref Range   Troponin I <0.03 <0.03 ng/mL  Troponin I     Status: None   Collection Time: 06/06/16  7:05 PM  Result Value Ref Range   Troponin I <0.03 <0.03 ng/mL     Lipid Panel     Component Value Date/Time   CHOL 187 06/06/2016 1113   TRIG 142 06/06/2016 1113   HDL 51 06/06/2016 1113   CHOLHDL 3.7 06/06/2016 1113   VLDL 28 06/06/2016 1113   LDLCALC 108 (H) 06/06/2016 1113   LDLDIRECT 99.0 08/08/2015 1351     No results found for: HGBA1C   Lab Results  Component Value Date   LDLCALC 108 (H) 06/06/2016   CREATININE 0.66 06/06/2016     HPI    [name] [name] is a 60 y.o. year old female with a history of OSA on CPAP, GERD, HTN, HLD, bipolar, obesity.   Pt admitted with chest pain, cards asked to see.   Pt describes chest pressure, going through to her  back. Less severe episodes going back 2 months, yesterday was more severe and she came to the ER. Episodes happening 3-4 x week. 5/10 at its worst. Did not seek help.  Pt had been running errands 12/19, then was unable to go to sleep. The chest pressure started and she got up to walk around because she could not sleep. The anxiety and pressure got worse, and it scared her so she came to the ER early on 12/20. The pressure was central and her L breast hurt, including the nipple. She got several medications, but the GI cocktail relieved her symptoms.   She has had problems with constipation and ? peptic ulcer. ER visit on 12/11 w/ constipation and abd pain.    She has had several deaths in the family and some other family issues in the last 9 months. She feels that her stress level has been much higher than usual  HOSPITAL COURSE:    Chest pain - sx atypical, not exertional and were prolonged without ez elevations or ECG changes - however, pt is likely to have  recurrent pain, patient had a nuclear stress test which was within normal limits. Patient discharged as per Dr Kyla Balzarine  cardiology recommendations   Hypertension Currently controlled wwithout medications,   Hyperlipidemia Continue Zocor,    PUD Patient was recently diagnosed with peptic ulcer disease, continue PPI twice a day and sucralfate.  Obstructive sleep apnea Continue CPAP at night   Bipolar disorder Continue home medications, currently denies any suicidal or homicidal ideation  Discharge Exam:   Blood pressure (!) 160/91, pulse 96, temperature 98.3 F (36.8 C), temperature source Oral, resp. rate 15, height '5\' 3"'$  (1.6 m), weight 73.1 kg (161 lb 1.6 oz), SpO2 98 %.      Follow-up Information    Alysia Penna, MD. Schedule an appointment as soon as possible for a visit in 2 day(s).   Specialty:  Family Medicine Why:  hospital follow up Contact information: [address] [place]  [id number] [phone]           Signed: Reyne Dumas 06/07/2016, 4:58 PM        Time spent >45 mins

## 2016-06-07 NOTE — Progress Notes (Signed)
While RN in room completing assessment patient asked if she had taken her Omeprazole today.  RN explained to patient that the hospital pharmacy did not have Omeprazole and usually will complete a therapeutic interchange for a similar medication.  Patient stated "well I have mine with me I can just take that".  RN explained to patient that she should not have any medications in her room and if she did they needed to be sent home with her significant other otherwise they would have to be sent to the hospital pharmacy per hospital policy.  Patient then stated her medications had already been taken home.  RN reviewed MAR and informed patient that she received Protonix earlier today which was ordered in place of her Omeprazole.  Patient stated understanding.  RN proceeded to ask patient questions to complete required shift charting and patient started to become agitated with RN's questions, stating to significant other she was extremely tired because she hadn't got a good nights sleep in awhile and was ready to leave the hospital and go home. Patient's significant other encouraged patient to stay at the hospital.  RN apologized to patient for all of the questions and explained again the need to ask (RN had earlier explained to patient that RN was going to complete an assessment and ask patient some questions to complete charting prior to RN starting assessment and patient was agreeable).  RN was able to complete assessment and charting and then patient stated she wanted to be left alone because she was ready to sleep.

## 2016-06-07 NOTE — Progress Notes (Signed)
GXT performed. Pt reached target at 1 min, 11 sec on Bruce protocol. Was able to continue till 3 minutes  Exercised stopped due to SOB and fatigue. ECG w/ upsloping ST depression, therefore Duke TM score intermediate. MD to reivew.  No chest pain during GXT.  Theodore DemarkBarrett, Rhonda, Cordelia Poche-C 06/07/2016 10:26 AM Beeper [phone]

## 2016-06-07 NOTE — Care Management Obs Status (Signed)
MEDICARE OBSERVATION STATUS NOTIFICATION   Patient Details  Name: [name] [name] MRN: [id number] Date of Birth: June 10, 1956   Medicare Observation Status Notification Given:  Yes    Gala LewandowskyGraves-Bigelow, Yacine Droz Kaye, RN 06/07/2016, 1:24 PM

## 2016-06-07 NOTE — Progress Notes (Signed)
RN in to see patient this morning and patient in good spirits and apoligized to RN about how she acted to [employer] last night explaining she was just tired from lack of sleep.

## 2016-06-07 NOTE — Evaluation (Signed)
Clinical/Bedside Swallow Evaluation Patient Details  Name: [name] [name] MRN: [id number] Date of Birth: [birth year]  Today's Date: 06/07/2016 Time: SLP Start Time (ACUTE ONLY): 0845 SLP Stop Time (ACUTE ONLY): 0905 SLP Time Calculation (min) (ACUTE ONLY): 20 min  Past Medical History:  Past Medical History:  Diagnosis Date  . Allergy    seasonal  . Bipolar 1 disorder (HCC)   . CPAP (continuous positive airway pressure) dependence   . Depression    [facility]   . GERD (gastroesophageal reflux disease)   . Hyperlipidemia   . Hypertension   . Memory loss    short term  . Obesity (BMI 30-39.9) 11/10/2015  . PPD positive 2007 or 2008  . Sleep apnea    on CPAP therapy  . Urinary incontinence   . Vestibular disorder    Past Surgical History:  Past Surgical History:  Procedure Laterality Date  . ABDOMINAL HYSTERECTOMY    . ANTERIOR FUSION CERVICAL SPINE  2006   Dr. Wynetta Emeryram  . BREAST BIOPSY Right 1981   benign  . BREAST SURGERY Bilateral 12-11-12   [place] [facility] [place] [facility] [facility]   . COLONOSCOPY  04-23-11   per Dr. Loreta AveMann, adenomatous polyps, repeat in 5 yrs   . ESOPHAGOGASTRODUODENOSCOPY  04-23-11   per Dr. Loreta AveMann, normal   . LUMBAR DISC SURGERY  2003   Dr. Wynetta Emeryram  . SPLIT NIGHT STUDY  11/11/2015  . UVULOPALATOPHARYNGOPLASTY    . VAGINAL HYSTERECTOMY  12/96   secondary to fibroids.   HPI:  60 year old female admitted 06/06/16 with chest pain. PMH significant for Bell's Palsy and "mental breakdown" in 2009, erosive esophagitis, HTN, OSA with PAP, bipolar disorder, PUD, GERD. PPI 2x/day. BSE ordered to evaluate swallow function and safety due to intermittent dysphagia.    Assessment / Plan / Recommendation Clinical Impression  Pt presents with slight left facial weakness, residual from Bell's Palsy (2009). Otherwise, oral motor exam is unremarkable. Pt reports history of difficulty swallowing and esopahgeal issues, however, she exhibits no overt s/s  aspiration on any consistency tested. Pt was given written dietary and behavioral strategies for management of esophageal dysmotility. These were reviewed with her and provided in written form. No ST follow up is recommended at this time. Please reconsult if needs arise. RN notified of results and recommendations.     Aspiration Risk  Mild aspiration risk    Diet Recommendation Thin liquid;Regular   Liquid Administration via: Cup;Straw Medication Administration: Whole meds with liquid Supervision: Patient able to self feed Compensations: Minimize environmental distractions;Slow rate;Small sips/bites;Follow solids with liquid Postural Changes: Seated upright at 90 degrees;Remain upright for at least 30 minutes after po intake    Other  Recommendations Recommended Consults: Consider esophageal assessment (if symptoms worsen) Oral Care Recommendations: Oral care BID   Follow up Recommendations none at this time     Frequency and Duration   n/a         Prognosis Prognosis for Safe Diet Advancement: Good      Swallow Study   General Date of Onset: 06/06/16 HPI: 60 year old female admitted 06/06/16 with chest pain. PMH significant for Bell's Palsy and "mental breakdown" in 2009, erosive esophagitis, HTN, OSA with PAP, bipolar disorder, PUD, GERD. PPI 2x/day. BSE ordered to evaluate swallow function and safety due to intermittent dysphagia.  Type of Study: Bedside Swallow Evaluation Previous Swallow Assessment: none found Diet Prior to this Study: Regular;Thin liquids Temperature Spikes Noted: No Respiratory Status:  Room air History of Recent Intubation: No Behavior/Cognition: Alert;Cooperative;Pleasant mood Oral Cavity Assessment: Within Functional Limits Oral Care Completed by SLP: No Oral Cavity - Dentition: Dentures, top;Dentures, bottom Vision: Functional for self-feeding Self-Feeding Abilities: Able to feed self Patient Positioning: Upright in chair Baseline Vocal Quality:  Normal Volitional Cough: Strong Volitional Swallow: Able to elicit    Oral/Motor/Sensory Function Overall Oral Motor/Sensory Function: Mild impairment Facial ROM: Reduced left Facial Symmetry: Abnormal symmetry left Facial Strength: Suspected CN VII (facial) dysfunction (hx Bell's Palsy on left) Facial Sensation: Within Functional Limits Lingual ROM: Within Functional Limits Lingual Symmetry: Within Functional Limits Lingual Strength: Within Functional Limits Lingual Sensation: Within Functional Limits Velum: Within Functional Limits Mandible: Within Functional Limits   Ice Chips Ice chips: Not tested   Thin Liquid Thin Liquid: Within functional limits Presentation: Self Fed;Cup    Nectar Thick Nectar Thick Liquid: Not tested   Honey Thick Honey Thick Liquid: Not tested   Puree Puree: Within functional limits Presentation: Self Fed;Spoon   Solid   GO   Solid: Within functional limits Presentation: Self Fed;Spoon    Functional Assessment Tool Used: asha noms, clinical judgment, BSE Functional Limitations: Swallowing Swallow Current Status ([id number]): At least 1 percent but less than 20 percent impaired, limited or restricted Swallow Goal Status [phone]): At least 1 percent but less than 20 percent impaired, limited or restricted Swallow Discharge Status [phone]([id number]): At least 1 percent but less than 20 percent impaired, limited or restricted     Celia B. [place], MSP, CCC-SLP [phone]  Leigh AuroraBueche, Celia Brown 06/07/2016,9:15 AM

## 2016-06-14 ENCOUNTER — Encounter: Payer: Self-pay | Admitting: Psychology

## 2016-06-15 DIAGNOSIS — G4733 Obstructive sleep apnea (adult) (pediatric): Secondary | ICD-10-CM | POA: Diagnosis not present

## 2016-06-18 HISTORY — PX: EYE SURGERY: SHX253

## 2016-06-23 ENCOUNTER — Other Ambulatory Visit: Payer: Self-pay | Admitting: Family Medicine

## 2016-06-25 NOTE — Telephone Encounter (Signed)
Okay to refill? 

## 2016-06-26 ENCOUNTER — Encounter: Payer: Self-pay | Admitting: Psychology

## 2016-06-29 ENCOUNTER — Ambulatory Visit (INDEPENDENT_AMBULATORY_CARE_PROVIDER_SITE_OTHER): Payer: PPO | Admitting: Family Medicine

## 2016-06-29 ENCOUNTER — Encounter: Payer: Self-pay | Admitting: Family Medicine

## 2016-06-29 VITALS — BP 125/85 | HR 83 | Temp 98.0°F | Ht 63.0 in | Wt 159.0 lb

## 2016-06-29 DIAGNOSIS — F419 Anxiety disorder, unspecified: Secondary | ICD-10-CM | POA: Diagnosis not present

## 2016-06-29 DIAGNOSIS — F3170 Bipolar disorder, currently in remission, most recent episode unspecified: Secondary | ICD-10-CM | POA: Diagnosis not present

## 2016-06-29 DIAGNOSIS — R079 Chest pain, unspecified: Secondary | ICD-10-CM | POA: Diagnosis not present

## 2016-06-29 DIAGNOSIS — K219 Gastro-esophageal reflux disease without esophagitis: Secondary | ICD-10-CM

## 2016-06-29 NOTE — Progress Notes (Signed)
   Subjective:    Patient ID: [name] [name], female    DOB: 08-Apr-1956, 61 y.o.   MRN: [id number]  HPI Here to follow up a hospital stay from 06-06-16 to 06-07-16 for chest pains. Her cardiac enzymes were normal and she had a normal treadmill stress test. She presented with chest pains that radiated to the back and to the left breast with some SOB. She was already being treated with Omeprazole 20 mg bid for peptic ulcers, but I believe the chest pains were related to esophageal spasms. She has been under a lot family stress lately and I think this played a role as well. Since going home she has felt fine, no more chest pains, and her BP has been stable.    Review of Systems  Constitutional: Negative.   Respiratory: Negative.   Cardiovascular: Positive for chest pain. Negative for palpitations.  Neurological: Negative.        Objective:   Physical Exam  Constitutional: She is oriented to person, place, and time. She appears well-developed and well-nourished. No distress.  Neck: No thyromegaly present.  Cardiovascular: Normal rate, regular rhythm and intact distal pulses.   Murmur heard. Pulmonary/Chest: Effort normal and breath sounds normal. No respiratory distress. She has no wheezes. She has no rales.  Lymphadenopathy:    She has no cervical adenopathy.  Neurological: She is alert and oriented to person, place, and time.          Assessment & Plan:  Chest pains, non-cardiac. Probably related to esophageal spasms and to anxiety. She is not stable. HTN is well controlled. Recheck prn. Gershon CraneStephen Fry, MD

## 2016-06-29 NOTE — Progress Notes (Signed)
Pre visit review using our clinic review tool, if applicable. No additional management support is needed unless otherwise documented below in the visit note. 

## 2016-07-16 DIAGNOSIS — Z6829 Body mass index (BMI) 29.0-29.9, adult: Secondary | ICD-10-CM | POA: Diagnosis not present

## 2016-07-16 DIAGNOSIS — Z1272 Encounter for screening for malignant neoplasm of vagina: Secondary | ICD-10-CM | POA: Diagnosis not present

## 2016-07-16 DIAGNOSIS — Z124 Encounter for screening for malignant neoplasm of cervix: Secondary | ICD-10-CM | POA: Diagnosis not present

## 2016-07-16 DIAGNOSIS — Z90710 Acquired absence of both cervix and uterus: Secondary | ICD-10-CM | POA: Diagnosis not present

## 2016-07-16 DIAGNOSIS — G4733 Obstructive sleep apnea (adult) (pediatric): Secondary | ICD-10-CM | POA: Diagnosis not present

## 2016-07-26 ENCOUNTER — Telehealth: Payer: Self-pay | Admitting: Family Medicine

## 2016-07-26 ENCOUNTER — Other Ambulatory Visit: Payer: Self-pay | Admitting: Family Medicine

## 2016-07-26 MED ORDER — HYDROCODONE-ACETAMINOPHEN 10-325 MG PO TABS: ORAL_TABLET | ORAL | 0 refills | Status: DC

## 2016-07-26 NOTE — Telephone Encounter (Signed)
° ° °  Pt request refill of the following: ° ° °HYDROcodone-acetaminophen (NORCO) 10-325 MG tablet ° ° °Phamacy: °

## 2016-07-26 NOTE — Telephone Encounter (Signed)
Done for one month  ?

## 2016-07-27 NOTE — Telephone Encounter (Signed)
Script is ready for pick up here at front office and I spoke with pt.  

## 2016-07-30 ENCOUNTER — Other Ambulatory Visit: Payer: Self-pay | Admitting: Family Medicine

## 2016-08-07 DIAGNOSIS — N62 Hypertrophy of breast: Secondary | ICD-10-CM | POA: Diagnosis not present

## 2016-08-07 DIAGNOSIS — N6489 Other specified disorders of breast: Secondary | ICD-10-CM | POA: Diagnosis not present

## 2016-08-13 DIAGNOSIS — Z9013 Acquired absence of bilateral breasts and nipples: Secondary | ICD-10-CM | POA: Diagnosis not present

## 2016-08-15 DIAGNOSIS — G4733 Obstructive sleep apnea (adult) (pediatric): Secondary | ICD-10-CM | POA: Diagnosis not present

## 2016-08-22 ENCOUNTER — Telehealth: Payer: Self-pay | Admitting: Family Medicine

## 2016-08-22 MED ORDER — HYDROCODONE-ACETAMINOPHEN 10-325 MG PO TABS: ORAL_TABLET | ORAL | 0 refills | Status: DC

## 2016-08-22 NOTE — Telephone Encounter (Signed)
done

## 2016-08-22 NOTE — Telephone Encounter (Signed)
Pt needs new rx hydrocodone °

## 2016-08-23 NOTE — Telephone Encounter (Signed)
Script is ready for pick up here at front office and I spoke with pt.  

## 2016-08-27 ENCOUNTER — Other Ambulatory Visit: Payer: Self-pay | Admitting: Family Medicine

## 2016-08-29 NOTE — Telephone Encounter (Signed)
Call in #90 with 5 rf 

## 2016-09-13 DIAGNOSIS — G4733 Obstructive sleep apnea (adult) (pediatric): Secondary | ICD-10-CM | POA: Diagnosis not present

## 2016-09-24 ENCOUNTER — Other Ambulatory Visit: Payer: Self-pay | Admitting: Family Medicine

## 2016-09-24 ENCOUNTER — Telehealth: Payer: Self-pay | Admitting: Family Medicine

## 2016-09-24 MED ORDER — HYDROCODONE-ACETAMINOPHEN 10-325 MG PO TABS: ORAL_TABLET | ORAL | 0 refills | Status: DC

## 2016-09-24 NOTE — Telephone Encounter (Signed)
done

## 2016-09-24 NOTE — Telephone Encounter (Signed)
Pt need new Rx for Hydrocodone  Pt is aware of 3 business days for refills and someone will give her call when ready for pick up

## 2016-09-25 NOTE — Telephone Encounter (Signed)
Pt aware and will send husband today.

## 2016-10-11 ENCOUNTER — Other Ambulatory Visit: Payer: Self-pay | Admitting: Obstetrics & Gynecology

## 2016-10-11 DIAGNOSIS — Z1231 Encounter for screening mammogram for malignant neoplasm of breast: Secondary | ICD-10-CM

## 2016-10-14 DIAGNOSIS — G4733 Obstructive sleep apnea (adult) (pediatric): Secondary | ICD-10-CM | POA: Diagnosis not present

## 2016-10-16 DIAGNOSIS — L255 Unspecified contact dermatitis due to plants, except food: Secondary | ICD-10-CM | POA: Diagnosis not present

## 2016-10-23 ENCOUNTER — Encounter: Payer: Self-pay | Admitting: Family Medicine

## 2016-10-23 ENCOUNTER — Ambulatory Visit (INDEPENDENT_AMBULATORY_CARE_PROVIDER_SITE_OTHER): Payer: PPO | Admitting: Family Medicine

## 2016-10-23 VITALS — BP 125/93 | HR 75 | Temp 98.2°F | Ht 63.0 in | Wt 153.0 lb

## 2016-10-23 DIAGNOSIS — R531 Weakness: Secondary | ICD-10-CM | POA: Diagnosis not present

## 2016-10-23 LAB — HEPATIC FUNCTION PANEL
ALT: 18 U/L (ref 0–35)
AST: 22 U/L (ref 0–37)
Albumin: 4.5 g/dL (ref 3.5–5.2)
Alkaline Phosphatase: 57 U/L (ref 39–117)
Bilirubin, Direct: 0.1 mg/dL (ref 0.0–0.3)
Total Bilirubin: 0.3 mg/dL (ref 0.2–1.2)
Total Protein: 7.3 g/dL (ref 6.0–8.3)

## 2016-10-23 LAB — BASIC METABOLIC PANEL
BUN: 14 mg/dL (ref 6–23)
CO2: 32 mEq/L (ref 19–32)
Calcium: 10 mg/dL (ref 8.4–10.5)
Chloride: 103 mEq/L (ref 96–112)
Creatinine, Ser: 0.62 mg/dL (ref 0.40–1.20)
GFR: 103.89 mL/min (ref >60.00)
Glucose, Bld: 89 mg/dL (ref 70–99)
Potassium: 4.1 mEq/L (ref 3.5–5.1)
Sodium: 138 mEq/L (ref 135–145)

## 2016-10-23 LAB — CBC WITH DIFFERENTIAL/PLATELET
Basophils Absolute: 0 10*3/uL (ref 0.0–0.1)
Basophils Relative: 0.4 % (ref 0.0–3.0)
Eosinophils Absolute: 0.2 10*3/uL (ref 0.0–0.7)
Eosinophils Relative: 2.4 % (ref 0.0–5.0)
HCT: 40.2 % (ref 36.0–46.0)
Hemoglobin: 13.5 g/dL (ref 12.0–15.0)
Lymphocytes Relative: 27.8 % (ref 12.0–46.0)
Lymphs Abs: 2.3 10*3/uL (ref 0.7–4.0)
MCHC: 33.5 g/dL (ref 30.0–36.0)
MCV: 90.6 fl (ref 78.0–100.0)
Monocytes Absolute: 0.4 10*3/uL (ref 0.1–1.0)
Monocytes Relative: 4.8 % (ref 3.0–12.0)
Neutro Abs: 5.4 10*3/uL (ref 1.4–7.7)
Neutrophils Relative %: 64.6 % (ref 43.0–77.0)
Platelets: 206 10*3/uL (ref 150.0–400.0)
RBC: 4.44 Mil/uL (ref 3.87–5.11)
RDW: 13.7 % (ref 11.5–15.5)
WBC: 8.3 10*3/uL (ref 4.0–10.5)

## 2016-10-23 LAB — VITAMIN B12: Vitamin B-12: 748 pg/mL (ref 211–911)

## 2016-10-23 LAB — HEMOGLOBIN A1C: Hgb A1c MFr Bld: 6.1 % (ref 4.6–6.5)

## 2016-10-23 LAB — TSH: TSH: 1.08 u[IU]/mL (ref 0.35–4.50)

## 2016-10-23 MED ORDER — HYDROCODONE-ACETAMINOPHEN 10-325 MG PO TABS: ORAL_TABLET | ORAL | 0 refills | Status: DC

## 2016-10-23 NOTE — Patient Instructions (Signed)
WE NOW OFFER   [facility] FAST TRACK!!!  SAME DAY Appointments for ACUTE CARE  Such as: Sprains, Injuries, cuts, abrasions, rashes, muscle pain, joint pain, back pain Colds, flu, sore throats, headache, allergies, cough, fever  Ear pain, sinus and eye infections Abdominal pain, nausea, vomiting, diarrhea, upset stomach Animal/insect bites  3 Easy Ways to Schedule: Walk-In Scheduling Call in scheduling Mychart Sign-up: [email or web address]         

## 2016-10-23 NOTE — Progress Notes (Signed)
   Subjective:    Patient ID: Marvetta GibbonsShelia Kay Dsouza, female    DOB: 1955-12-24, 61 y.o.   MRN: [id number]  HPI Here to discuss a month long hx of generalized weakness, headaches, nausea without vomiting, decreased appetite, and weight loss. She has lost 6 lbs since January, 12 lbs since December. No fevers. She has been eating a healthier diet than previously.    Review of Systems  Constitutional: Positive for appetite change, fatigue and unexpected weight change. Negative for activity change, chills, diaphoresis and fever.  Respiratory: Negative.   Cardiovascular: Negative.   Gastrointestinal: Positive for nausea. Negative for abdominal distention, abdominal pain, blood in stool, constipation, diarrhea and vomiting.  Endocrine: Negative.   Genitourinary: Negative.   Neurological: Positive for headaches. Negative for dizziness.       Objective:   Physical Exam  Constitutional: She is oriented to person, place, and time. She appears well-developed and well-nourished. No distress.  Neck: No thyromegaly present.  Cardiovascular: Normal rate, regular rhythm, normal heart sounds and intact distal pulses.   Pulmonary/Chest: Effort normal and breath sounds normal. No respiratory distress. She has no wheezes. She has no rales.  Abdominal: Soft. Bowel sounds are normal. She exhibits no distension. There is no tenderness. There is no rebound and no guarding.  Lymphadenopathy:    She has no cervical adenopathy.  Neurological: She is alert and oriented to person, place, and time.          Assessment & Plan:  She has various symptoms that could be from a multitude of etiologies. We will get labs today to try to narrow these down.  Gershon CraneStephen Fry, MD

## 2016-10-25 ENCOUNTER — Encounter: Payer: Self-pay | Admitting: Family Medicine

## 2016-10-31 ENCOUNTER — Ambulatory Visit: Payer: Self-pay

## 2016-11-02 ENCOUNTER — Encounter: Payer: Self-pay | Admitting: Family Medicine

## 2016-11-02 ENCOUNTER — Ambulatory Visit (INDEPENDENT_AMBULATORY_CARE_PROVIDER_SITE_OTHER): Payer: PPO | Admitting: Family Medicine

## 2016-11-02 VITALS — BP 138/86 | HR 69 | Temp 97.8°F | Ht 63.0 in | Wt 155.0 lb

## 2016-11-02 DIAGNOSIS — H40033 Anatomical narrow angle, bilateral: Secondary | ICD-10-CM | POA: Diagnosis not present

## 2016-11-02 DIAGNOSIS — H04123 Dry eye syndrome of bilateral lacrimal glands: Secondary | ICD-10-CM | POA: Diagnosis not present

## 2016-11-02 DIAGNOSIS — N3000 Acute cystitis without hematuria: Secondary | ICD-10-CM | POA: Diagnosis not present

## 2016-11-02 LAB — POC URINALSYSI DIPSTICK (AUTOMATED)
Bilirubin, UA: NEGATIVE
Blood, UA: NEGATIVE
Glucose, UA: NEGATIVE
Ketones, UA: NEGATIVE
Leukocytes, UA: NEGATIVE
Nitrite, UA: POSITIVE
Protein, UA: NEGATIVE
Spec Grav, UA: >=1.03 — AB (ref 1.010–1.025)
Urobilinogen, UA: NEGATIVE E.U./dL — AB
pH, UA: 6 (ref 5.0–8.0)

## 2016-11-02 MED ORDER — CIPROFLOXACIN HCL 500 MG PO TABS: 500.0000 mg | ORAL_TABLET | Freq: Two times a day (BID) | ORAL | 0 refills | Status: DC

## 2016-11-02 NOTE — Progress Notes (Signed)
   Subjective:    Patient ID: Marvetta GibbonsShelia Kay Rozman, female    DOB: 02/17/1956, 61 y.o.   MRN: [id number]  HPI Here asking if she may have a UTI. She was here a few weeks ago for generalized weakness and we did lab work which all returned as normal. Today she describes several days of urine having a foul odor, burning on urination, and having frequency of urination. No fever.    Review of Systems  Constitutional: Negative.   Respiratory: Negative.   Cardiovascular: Negative.   Gastrointestinal: Negative.   Genitourinary: Positive for dysuria, frequency and urgency. Negative for flank pain and hematuria.       Objective:   Physical Exam  Constitutional: She appears well-developed and well-nourished.  Cardiovascular: Normal rate, regular rhythm, normal heart sounds and intact distal pulses.   Pulmonary/Chest: Effort normal and breath sounds normal.  Abdominal: Soft. Bowel sounds are normal. She exhibits no distension and no mass. There is no tenderness. There is no rebound and no guarding.          Assessment & Plan:  UTI. Treat with Cipro. Drink fluids. Culture the sample.  Gershon CraneStephen Marcelia Petersen, MD

## 2016-11-02 NOTE — Patient Instructions (Signed)
WE NOW OFFER   [facility] FAST TRACK!!!  SAME DAY Appointments for ACUTE CARE  Such as: Sprains, Injuries, cuts, abrasions, rashes, muscle pain, joint pain, back pain Colds, flu, sore throats, headache, allergies, cough, fever  Ear pain, sinus and eye infections Abdominal pain, nausea, vomiting, diarrhea, upset stomach Animal/insect bites  3 Easy Ways to Schedule: Walk-In Scheduling Call in scheduling Mychart Sign-up: [email or web address]         

## 2016-11-05 LAB — URINE CULTURE

## 2016-11-09 ENCOUNTER — Ambulatory Visit
Admission: RE | Admit: 2016-11-09 | Discharge: 2016-11-09 | Disposition: A | Discharge status: Home / Self Care | Payer: PPO | Source: Ambulatory Visit | Attending: Obstetrics & Gynecology | Admitting: Obstetrics & Gynecology

## 2016-11-09 DIAGNOSIS — Z1231 Encounter for screening mammogram for malignant neoplasm of breast: Secondary | ICD-10-CM

## 2016-11-13 DIAGNOSIS — G4733 Obstructive sleep apnea (adult) (pediatric): Secondary | ICD-10-CM | POA: Diagnosis not present

## 2016-11-14 ENCOUNTER — Telehealth: Payer: Self-pay

## 2016-11-14 MED ORDER — NITROFURANTOIN MACROCRYSTAL 100 MG PO CAPS: 100.0000 mg | ORAL_CAPSULE | Freq: Two times a day (BID) | ORAL | 0 refills | Status: DC

## 2016-11-14 NOTE — Telephone Encounter (Signed)
Patient's husband called to report that pt is not improving after abx therapy for UTI. She still has lower abdominal pain, diarrhea, malaise/fatigue and loss of appetite. He is concerned that her infection has spread or was not cleared. He denies her having any fever or vomiting but states that she feels she is not well.  Dr. Clent RidgesFry - Please advise. Thanks!

## 2016-11-14 NOTE — Telephone Encounter (Signed)
Spoke with pt's husband and advised. Rx sent to pharmacy. He is aware to contact office if pt not improving. Nothing further needed at this time.

## 2016-11-14 NOTE — Telephone Encounter (Signed)
Call in Nitrofurantoin 100 mg bid for 7 days. If this does not help she should probably come back in to reevaluate

## 2016-11-15 DIAGNOSIS — G4733 Obstructive sleep apnea (adult) (pediatric): Secondary | ICD-10-CM | POA: Diagnosis not present

## 2016-11-22 DIAGNOSIS — G4733 Obstructive sleep apnea (adult) (pediatric): Secondary | ICD-10-CM | POA: Diagnosis not present

## 2016-11-26 ENCOUNTER — Other Ambulatory Visit: Payer: Self-pay

## 2016-11-26 NOTE — Telephone Encounter (Signed)
Pt requests refill hydrocodone

## 2016-11-27 DIAGNOSIS — H02413 Mechanical ptosis of bilateral eyelids: Secondary | ICD-10-CM | POA: Diagnosis not present

## 2016-11-27 MED ORDER — HYDROCODONE-ACETAMINOPHEN 10-325 MG PO TABS: ORAL_TABLET | ORAL | 0 refills | Status: DC

## 2016-11-27 NOTE — Telephone Encounter (Signed)
done

## 2016-11-28 ENCOUNTER — Ambulatory Visit (INDEPENDENT_AMBULATORY_CARE_PROVIDER_SITE_OTHER): Payer: PPO | Admitting: Family Medicine

## 2016-11-28 ENCOUNTER — Encounter: Payer: Self-pay | Admitting: Family Medicine

## 2016-11-28 VITALS — BP 134/90 | HR 65 | Temp 98.4°F | Ht 63.0 in | Wt 156.0 lb

## 2016-11-28 DIAGNOSIS — R8290 Unspecified abnormal findings in urine: Secondary | ICD-10-CM | POA: Diagnosis not present

## 2016-11-28 DIAGNOSIS — R5383 Other fatigue: Secondary | ICD-10-CM | POA: Diagnosis not present

## 2016-11-28 LAB — POC URINALSYSI DIPSTICK (AUTOMATED)
Bilirubin, UA: NEGATIVE
Blood, UA: NEGATIVE
Glucose, UA: NEGATIVE
Ketones, UA: NEGATIVE
Leukocytes, UA: NEGATIVE
Nitrite, UA: NEGATIVE
Protein, UA: NEGATIVE
Spec Grav, UA: 1.015 (ref 1.010–1.025)
Urobilinogen, UA: 0.2 E.U./dL
pH, UA: 7 (ref 5.0–8.0)

## 2016-11-28 NOTE — Patient Instructions (Signed)
WE NOW OFFER   [name] FAST TRACK!!!  SAME DAY Appointments for ACUTE CARE  Such as: Sprains, Injuries, cuts, abrasions, rashes, muscle pain, joint pain, back pain Colds, flu, sore throats, headache, allergies, cough, fever  Ear pain, sinus and eye infections Abdominal pain, nausea, vomiting, diarrhea, upset stomach Animal/insect bites  3 Easy Ways to Schedule: Walk-In Scheduling Call in scheduling Mychart Sign-up: [email or web address]         

## 2016-11-28 NOTE — Progress Notes (Signed)
   Subjective:    Patient ID: [name] [name], female    DOB: 09-10-1955, 61 y.o.   MRN: [id number]  HPI Here to follow up on a recent UTI and to discuss her chronic fatigue. She was found to have an E coli UTI a few weeks ago. She started taking Cipro and was then switched to Nitrofurantoin. She feels fine now. She also wants to discuss how she feels tired all the time. We recently did extensive lab work that was unrevealing. She uses a CPAP for sleep due to sleep apnea, and she had had trouble recently with the fit of her mask. She has scheduled for a home health nurse to come to her home later this week to check all her CPAP supplies.    Review of Systems  Constitutional: Positive for fatigue.  Respiratory: Negative.   Cardiovascular: Negative.   Gastrointestinal: Negative.   Genitourinary: Negative.   Neurological: Negative.        Objective:   Physical Exam  Constitutional: She is oriented to person, place, and time. She appears well-developed and well-nourished.  Cardiovascular: Normal rate, regular rhythm, normal heart sounds and intact distal pulses.   Pulmonary/Chest: Effort normal and breath sounds normal.  Neurological: She is alert and oriented to person, place, and time.          Assessment & Plan:  Her UA today is clear, so he recent UTI has resolved. As for the fatigue, the most likely explanation is poorly controlled sleep apnea. Hopefully the equipment can be adjusted to help her feel better.  Gershon CraneStephen Glema Takaki, MD

## 2016-12-06 DIAGNOSIS — G4733 Obstructive sleep apnea (adult) (pediatric): Secondary | ICD-10-CM | POA: Diagnosis not present

## 2016-12-07 DIAGNOSIS — G4733 Obstructive sleep apnea (adult) (pediatric): Secondary | ICD-10-CM | POA: Diagnosis not present

## 2016-12-12 DIAGNOSIS — Z9013 Acquired absence of bilateral breasts and nipples: Secondary | ICD-10-CM | POA: Diagnosis not present

## 2016-12-14 DIAGNOSIS — G4733 Obstructive sleep apnea (adult) (pediatric): Secondary | ICD-10-CM | POA: Diagnosis not present

## 2016-12-14 DIAGNOSIS — H2512 Age-related nuclear cataract, left eye: Secondary | ICD-10-CM | POA: Diagnosis not present

## 2016-12-14 DIAGNOSIS — H2511 Age-related nuclear cataract, right eye: Secondary | ICD-10-CM | POA: Diagnosis not present

## 2016-12-17 ENCOUNTER — Other Ambulatory Visit: Payer: Self-pay | Admitting: Family Medicine

## 2016-12-27 ENCOUNTER — Telehealth: Payer: Self-pay | Admitting: Family Medicine

## 2016-12-27 DIAGNOSIS — Z01818 Encounter for other preprocedural examination: Secondary | ICD-10-CM | POA: Diagnosis not present

## 2016-12-27 DIAGNOSIS — H2512 Age-related nuclear cataract, left eye: Secondary | ICD-10-CM | POA: Diagnosis not present

## 2016-12-27 DIAGNOSIS — H40033 Anatomical narrow angle, bilateral: Secondary | ICD-10-CM | POA: Diagnosis not present

## 2016-12-27 DIAGNOSIS — H2513 Age-related nuclear cataract, bilateral: Secondary | ICD-10-CM | POA: Diagnosis not present

## 2016-12-27 MED ORDER — HYDROCODONE-ACETAMINOPHEN 10-325 MG PO TABS: ORAL_TABLET | ORAL | 0 refills | Status: DC

## 2016-12-27 NOTE — Telephone Encounter (Signed)
Pt need new Rx for Hydrocodone   Pt is aware of 3 business days for refills and someone will call when ready for pick up.  Pt will be out today having eye surgery.

## 2016-12-27 NOTE — Telephone Encounter (Signed)
done

## 2016-12-28 NOTE — Telephone Encounter (Signed)
Script is ready for pick up here at front office and left a message for pt.  

## 2017-01-14 ENCOUNTER — Other Ambulatory Visit: Payer: Self-pay | Admitting: Family Medicine

## 2017-01-14 ENCOUNTER — Telehealth: Payer: Self-pay | Admitting: *Deleted

## 2017-01-14 NOTE — Telephone Encounter (Signed)
Patient appointment has been rescheduled to 03/29/2017 at 2:40pm and patient agrees to the change.

## 2017-01-28 ENCOUNTER — Telehealth: Payer: Self-pay | Admitting: Family Medicine

## 2017-01-28 DIAGNOSIS — Z961 Presence of intraocular lens: Secondary | ICD-10-CM | POA: Diagnosis not present

## 2017-01-28 DIAGNOSIS — H2511 Age-related nuclear cataract, right eye: Secondary | ICD-10-CM | POA: Diagnosis not present

## 2017-01-28 DIAGNOSIS — H2513 Age-related nuclear cataract, bilateral: Secondary | ICD-10-CM | POA: Diagnosis not present

## 2017-01-28 NOTE — Telephone Encounter (Signed)
Pt need new Rx for  Hydrocodone .  Pt is aware of 3 business days for refills and someone will call when ready for pick up.  Pt would like to see if she could pick it up today while in the area at another doctor appointment.

## 2017-01-29 ENCOUNTER — Telehealth: Payer: Self-pay | Admitting: Family Medicine

## 2017-01-29 MED ORDER — HYDROCODONE-ACETAMINOPHEN 10-325 MG PO TABS: ORAL_TABLET | ORAL | 0 refills | Status: DC

## 2017-01-29 NOTE — Telephone Encounter (Signed)
Script is ready for pick up and I spoke with pt.  

## 2017-01-29 NOTE — Telephone Encounter (Signed)
Pt needs new rx hydrocodone °

## 2017-01-29 NOTE — Telephone Encounter (Signed)
done

## 2017-01-29 NOTE — Telephone Encounter (Signed)
Script is ready for pick up here at front office, tried to reach pt and no answer.  

## 2017-02-21 ENCOUNTER — Ambulatory Visit: Payer: Self-pay | Admitting: Cardiology

## 2017-02-26 ENCOUNTER — Telehealth: Payer: Self-pay | Admitting: Family Medicine

## 2017-02-26 MED ORDER — HYDROCODONE-ACETAMINOPHEN 10-325 MG PO TABS
ORAL_TABLET | ORAL | 0 refills | Status: DC
Start: 2017-02-26 — End: 2017-03-29

## 2017-02-26 NOTE — Telephone Encounter (Signed)
° ° ° °  Pt request refill of the following:  HYDROcodone-acetaminophen (NORCO) 10-325 MG tablet  alprazolam (XANAX) 2 MG tablet   Phamacy:

## 2017-02-26 NOTE — Telephone Encounter (Signed)
The Norco rx is ready to pick up, also call in Xanax #[phone] with 5 rf

## 2017-02-28 MED ORDER — ALPRAZOLAM 2 MG PO TABS: 2.0000 mg | ORAL_TABLET | Freq: Three times a day (TID) | ORAL | 5 refills | Status: DC | PRN

## 2017-02-28 NOTE — Addendum Note (Signed)
Addended by: Aniceto BossNIMMONS, Juanya Villavicencio A on: 02/28/2017 09:18 AM   Modules accepted: Orders

## 2017-02-28 NOTE — Addendum Note (Signed)
Addended by: Aniceto BossNIMMONS, SYLVIA A on: 02/28/2017 09:20 AM   Modules accepted: Orders

## 2017-02-28 NOTE — Telephone Encounter (Signed)
I left voice message for pt, I called in script for Xanax to [facility] and other script is ready for pick up here at front office.

## 2017-03-04 DIAGNOSIS — H02412 Mechanical ptosis of left eyelid: Secondary | ICD-10-CM | POA: Diagnosis not present

## 2017-03-04 DIAGNOSIS — H02413 Mechanical ptosis of bilateral eyelids: Secondary | ICD-10-CM | POA: Diagnosis not present

## 2017-03-04 DIAGNOSIS — H02411 Mechanical ptosis of right eyelid: Secondary | ICD-10-CM | POA: Diagnosis not present

## 2017-03-07 ENCOUNTER — Encounter: Payer: Self-pay | Admitting: Family Medicine

## 2017-03-24 ENCOUNTER — Encounter: Payer: Self-pay | Admitting: Cardiology

## 2017-03-25 ENCOUNTER — Encounter: Payer: Self-pay | Admitting: Family Medicine

## 2017-03-25 ENCOUNTER — Ambulatory Visit (INDEPENDENT_AMBULATORY_CARE_PROVIDER_SITE_OTHER): Payer: PPO | Admitting: Family Medicine

## 2017-03-25 VITALS — BP 120/78 | HR 75 | Temp 98.2°F | Resp 12 | Ht 63.0 in | Wt 159.4 lb

## 2017-03-25 DIAGNOSIS — J029 Acute pharyngitis, unspecified: Secondary | ICD-10-CM

## 2017-03-25 DIAGNOSIS — Z20818 Contact with and (suspected) exposure to other bacterial communicable diseases: Secondary | ICD-10-CM

## 2017-03-25 DIAGNOSIS — H9209 Otalgia, unspecified ear: Secondary | ICD-10-CM | POA: Diagnosis not present

## 2017-03-25 DIAGNOSIS — Z23 Encounter for immunization: Secondary | ICD-10-CM | POA: Diagnosis not present

## 2017-03-25 DIAGNOSIS — J309 Allergic rhinitis, unspecified: Secondary | ICD-10-CM

## 2017-03-25 LAB — POCT RAPID STREP A (OFFICE): Rapid Strep A Screen: NEGATIVE

## 2017-03-25 MED ORDER — AZELASTINE HCL 0.1 % NA SOLN: 2.0000 | Freq: Two times a day (BID) | NASAL | 2 refills | Status: DC

## 2017-03-25 MED ORDER — FLUTICASONE PROPIONATE 50 MCG/ACT NA SUSP: 1.0000 | Freq: Two times a day (BID) | NASAL | 1 refills | Status: DC

## 2017-03-25 NOTE — Progress Notes (Signed)
ACUTE VISIT  HPI:  Chief Complaint  Patient presents with  . Sore Throat  . Otalgia    [name] is a 61 y.o.female here today complaining of 2 weeks of of respiratory symptoms.  She was exposed to strep about 2 weeks ago.  Sore Throat   This is a new problem. The current episode started 1 to 4 weeks ago. The problem has been unchanged. There has been no fever. The pain is mild. Associated symptoms include congestion, ear pain, headaches and a plugged ear sensation. Pertinent negatives include no abdominal pain, coughing, diarrhea, hoarse voice, shortness of breath, stridor, swollen glands, trouble swallowing or vomiting. She has had exposure to strep. She has had no exposure to mono.  Otalgia   Associated symptoms include headaches, rhinorrhea and a sore throat. Pertinent negatives include no abdominal pain, coughing, diarrhea, rash or vomiting.   She denies cough. Mild nasal congestion, rhinorrhea, nd post nasal drainage.  Frontal pressure headache, mild. No Hx of recent travel.  No known insect bite.  Hx of allergies: Allergic rhinitis, she is not on OTC antihistaminic. She recently had cataract surgery and left palpebral ptosis correction (s/p Bell's palsy).Next f/u appt in a month.  OTC medications for this problem: Mucinex D  She also would like her flu shot today.  Review of Systems  Constitutional: Positive for fatigue. Negative for activity change, appetite change and fever.  HENT: Positive for congestion, ear pain, postnasal drip, rhinorrhea, sinus pressure and sore throat. Negative for hoarse voice, mouth sores, nosebleeds, sneezing, trouble swallowing and voice change.   Eyes: Negative for discharge, redness, itching and visual disturbance.  Respiratory: Negative for cough, chest tightness, shortness of breath, wheezing and stridor.   Gastrointestinal: Negative for abdominal pain, diarrhea, nausea and vomiting.  Musculoskeletal: Negative for  gait problem and myalgias.  Skin: Negative for rash.  Allergic/Immunologic: Positive for environmental allergies.  Neurological: Positive for headaches. Negative for syncope and weakness.  Hematological: Negative for adenopathy. Does not bruise/bleed easily.  Psychiatric/Behavioral: Negative for confusion. The patient is nervous/anxious.       Current Outpatient Prescriptions on File Prior to Visit  Medication Sig Dispense Refill  . acyclovir (ZOVIRAX) 400 MG tablet TAKE 1 TABLET BY MOUTH 4 TIMES A DAY AS NEEDED 120 tablet 5  . alprazolam (XANAX) 2 MG tablet Take 1 tablet (2 mg total) by mouth 3 (three) times daily as needed. 90 tablet 5  . CALCIUM-MAGNESIUM-ZINC PO Take by mouth daily.    . Cholecalciferol (VITAMIN D3) 2000 units capsule Take 2,000 Units by mouth daily.    . CycloSPORINE (RESTASIS OP) Apply to eye daily.    . Garlic 1000 MG CAPS Take 1,000 mg by mouth daily.    . Ginger, Zingiber officinalis, (GINGER ROOT) 550 MG CAPS Take 1 capsule by mouth daily.    [name] HYDROcodone-acetaminophen (NORCO) 10-325 MG tablet TAKE 1 TABLET BY MOUTH EVERY 6 HOURS AS NEEDED FOR MODERATE PAIN. (MAY FILL 03/24/16) 120 tablet 0  . lamoTRIgine (LAMICTAL) 100 MG tablet TAKE 1 TABLET BY MOUTH 2 TIMES A DAY. (Patient taking differently: TAKE 1 TABLET BY MOUTH once a day) 60 tablet 11  . Multiple Vitamin (MULTIVITAMIN) tablet Take 1 tablet by mouth daily.      . Omega-3 Fatty Acids (FISH OIL PO) Take by mouth daily.    [name] omeprazole (PRILOSEC) 20 MG capsule Take 1 capsule (20 mg total) by mouth 2 (two) times daily before a meal. (Patient  taking differently: Take 20 mg by mouth daily. ) 60 capsule 6  . polyethylene glycol powder (GLYCOLAX/MIRALAX) powder Take 17 g by mouth 2 (two) times daily as needed. (Patient taking differently: Take 17 g by mouth 2 (two) times daily as needed for mild constipation. ) 3350 g 11  . Probiotic Product (PROBIOTIC PO) Take by mouth daily.    . QUEtiapine (SEROQUEL) 50 MG  tablet Take 1 tablet (50 mg total) by mouth at bedtime. 90 tablet 1  . QUEtiapine (SEROQUEL) 50 MG tablet TAKE 1 TABLET BY MOUTH AT BEDTIME. 90 tablet 3  . Saline (SIMPLY SALINE) 0.9 % AERS Place 1 spray into both nostrils daily as needed.    . simvastatin (ZOCOR) 40 MG tablet TAKE 1 TABLET BY MOUTH EVERY NIGHT AT BEDTIME 90 tablet 1  . TURMERIC PO Take by mouth daily.     No current facility-administered medications on file prior to visit.      Past Medical History:  Diagnosis Date  . Allergy    seasonal  . Bipolar 1 disorder (HCC)   . CPAP (continuous positive airway pressure) dependence   . Depression    sees the [facility]   . GERD (gastroesophageal reflux disease)   . Hyperlipidemia   . Hypertension   . Memory loss    short term  . Obesity (BMI 30-39.9) 11/10/2015  . PPD positive 2007 or 2008  . Sleep apnea    on CPAP therapy  . Urinary incontinence   . Vestibular disorder    Allergies  Allergen Reactions  . Sulfonamide Derivatives Other (See Comments)    Facial swelling  . Morphine And Related Nausea And Vomiting  . Sulfamethoxazole Other (See Comments)    unknown    Social History   Social History  . Marital status: Divorced    Spouse name: N/A  . Number of children: 2  . Years of education: N/A   Occupational History  . Disabled    Social History Main Topics  . Smoking status: Current Every Day Smoker    Packs/day: 0.25    Years: 30.00    Types: Cigarettes  . Smokeless tobacco: Never Used  . Alcohol use No  . Drug use: No  . Sexual activity: Not Asked   Other Topics Concern  . None   Social History Narrative   Pt lives in [place], [place].    Vitals:   03/25/17 1148  BP: 120/78  Pulse: 75  Resp: 12  Temp: 98.2 F (36.8 C)  SpO2: 98%   Body mass index is 28.23 kg/m.   Physical Exam  Nursing note and vitals reviewed. Constitutional: She is oriented to person, place, and time. She appears well-developed. She does not appear ill. No  distress.  HENT:  Head: Normocephalic and atraumatic.  Right Ear: External ear and ear canal normal. No tenderness. Tympanic membrane is not erythematous and not bulging. A middle ear effusion is present.  Left Ear: External ear and ear canal normal. No tenderness. Tympanic membrane is not erythematous and not bulging. A middle ear effusion is present.  Nose: Rhinorrhea present. Right sinus exhibits no maxillary sinus tenderness and no frontal sinus tenderness. Left sinus exhibits no maxillary sinus tenderness and no frontal sinus tenderness.  Mouth/Throat: Oropharynx is clear and moist and mucous membranes are normal.  Post nasal drainage. Hypertrophic turbinates.  Eyes: Conjunctivae are normal.  Neck: No edema and no erythema present.  Cardiovascular: Normal rate and regular rhythm.   No murmur  heard. Respiratory: Effort normal and breath sounds normal. No stridor. No respiratory distress.  Lymphadenopathy:       Head (right side): No submandibular adenopathy present.       Head (left side): No submandibular adenopathy present.    She has cervical adenopathy (Small , < 1 cm).       Right cervical: Posterior cervical adenopathy present.       Left cervical: Posterior cervical adenopathy present.  Neurological: She is alert and oriented to person, place, and time. She has normal strength. Gait normal.  Skin: Skin is warm. No rash noted. No erythema.  Psychiatric: Her speech is normal. Her mood appears anxious.  Poor groomed, good eye contact.    ASSESSMENT AND PLAN:   [name] [name] was seen today for sore throat and otalgia.  Diagnoses and all orders for this visit:  Pharyngitis, unspecified etiology  Possible etiologies discussed, allergic vs viral. Clinical findings do not suggest strep throat, rapid strep negative. Symptomatic treatment is usually recommended for now. I do not think abx is needed at this time. Instructed to monitor for signs of complications, including new onset of  fever among some, clearly instructed about warning signs. I also explained that cough and nasal congestion can last a few days and sometimes weeks. F/U as needed.   -     Culture, Group A Strep  Exposure to strep throat  2 weeks ago and symptoms also for 2 weeks. Clinical findings do not suggest bacterial etiology at this time. Throat Cx sent and further recommendations will be given according to Cx.  -     POC Rapid Strep A -     Culture, Group A Strep  Allergic rhinitis, unspecified seasonality, unspecified trigger  This could be causing or aggravating problem. Instructed to contact ophthalmologist and let him/her know intranasal steroid was recommended. Nasal irrigations with saline as needed. OTC antihistaminic, Allegra 180 mg daily.  -     azelastine (ASTELIN) 0.1 % nasal spray; Place 2 sprays into both nostrils 2 (two) times daily. Use in each nostril as directed -     fluticasone (FLONASE) 50 MCG/ACT nasal spray; Place 1 spray into both nostrils 2 (two) times daily.   Earache  ? Eustachian tube dysfunction. TM with no erythema, bilateral. Ear canal normal,bilateral. Instructed about warning signs.   Need for influenza vaccination -     Flu Vaccine QUAD 36+ mos IM    -[name] advised to seek attention immediately if symptoms worsen or to follow if they persist or new concerns arise.       [name] [name] G. [place], MD  [facility]. [place] office.

## 2017-03-25 NOTE — Patient Instructions (Addendum)
Ms.Jamiya Laqueta Carina I have seen you today for an acute visit.  A few things to remember from today's visit:   Exposure to strep throat - Plan: POC Rapid Strep A  Need for influenza vaccination - Plan: Flu Vaccine QUAD 36+ mos IM  Pharyngitis, unspecified etiology  Allergic rhinitis, unspecified seasonality, unspecified trigger - Plan: fluticasone (FLONASE) 50 MCG/ACT nasal spray, azelastine (ASTELIN) 0.1 % nasal spray   Symptomatic treatment: Over the counter Acetaminophen 500 mg and/or Ibuprofen (400-600 mg) if there is not contraindications; you can alternate in between both every 4-6 hours. Gargles with saline water and throat lozenges might also help. Cold fluids.    Seek prompt medical evaluation if you are having difficulty breathing, mouth swelling, throat closing up, not able to swallow liquids (drooling), skin rash/bruising, or worsening symptoms.  Please follow up in 2 weeks if not any better.    Medications prescribed today are intended for short period of time and will not be refill upon request, a follow up appointment might be necessary to discuss continuation of of treatment if appropriate.   There are 2 forms of allergic rhinitis: . Seasonal (hay fever): Caused by an allergy to pollen and/or mold spores in the air. Pollen is the fine powder that comes from the stamen of flowering plants. It can be carried through the air and is easily inhaled. Symptoms are seasonal and usually occur in spring, late summer, and fall. [name] Perennial: Caused by other allergens such as dust mites, pet hair or dander, or mold. Symptoms occur year-round.  Symptoms: Your symptoms can vary, depending on the severity of your allergies. Symptoms can include: Sneezing, coughing.itching (mostly eyes, nose, mouth, throat and skin),runny nose,stuffy nose.headache,pressure in the nose and cheeks,ear fullness and popping, sore throat.watery, red, or swollen eyes,dark circles under your  eyes,trouble smelling, and sometimes hives.  Allergic rhinitis cannot be prevented. You can help your symptoms by avoiding the things that you are allergic, including: . Keeping windows closed. This is especially important during high-pollen seasons. . Washing your hands after petting animals. . Using dust- and mite-proof bedding and mattress covers. . Wearing glasses outside to protect your eyes. . Showering before bed to wash off allergens from hair and skin. You can also avoid things that can make your symptoms worse, such as: . aerosol sprays . air pollution . cold temperatures . humidity . irritating fumes . tobacco smoke . wind . wood smoke.   Antihistamines help reduce the sneezing, runny nose, and itchiness of allergies. These come in pill form and as nasal sprays. Allegra,Zyrtec,or Claritin are some examples. Decongestants, such as pseudoephedrine and phenylephrine, help temporarily relieve the stuffy nose of allergies. Decongestants are found in many medicines and come as pills, nose sprays, and nose drops. They could increase heart rate and cause tachycardia and tremor. Nasal Afrin should not be used for more than 3 days because you can become dependent on them. This causes you to feel even more stopped-up when you try to quit using them.  Nasal sprays: steroids or antihistaminics. Over the counter intranasal sterids: Nasocort,Rhinocort,or Flonase.You won't notice their benefits for up to 2 weeks after starting them. Allergy shots or sublingual tablets when other treatment do not help.This is done by immunologists.    In general please monitor for signs of worsening symptoms and seek immediate medical attention if any concerning.  If symptoms are not resolved in 2 weeks you should schedule a follow up appointment with your doctor, before if needed.  I hope you get better soon!

## 2017-03-27 ENCOUNTER — Encounter: Payer: Self-pay | Admitting: Family Medicine

## 2017-03-27 LAB — CULTURE, GROUP A STREP
MICRO NUMBER:: 81117368
SPECIMEN QUALITY:: ADEQUATE

## 2017-03-29 ENCOUNTER — Ambulatory Visit: Payer: Self-pay | Admitting: Cardiology

## 2017-03-29 ENCOUNTER — Telehealth: Payer: Self-pay | Admitting: Family Medicine

## 2017-03-29 MED ORDER — HYDROCODONE-ACETAMINOPHEN 10-325 MG PO TABS: ORAL_TABLET | ORAL | 0 refills | Status: DC

## 2017-03-29 NOTE — Telephone Encounter (Signed)
Script is ready for pick up here at front office, tried to reach pt no answer, also letter was given.

## 2017-03-29 NOTE — Telephone Encounter (Signed)
Done

## 2017-03-29 NOTE — Telephone Encounter (Signed)
Patient called to state she needs the following medication refilled.   HYDROcodone-acetaminophen (NORCO) 10-325 MG tablet

## 2017-05-06 ENCOUNTER — Encounter: Payer: Self-pay | Admitting: Family Medicine

## 2017-05-06 ENCOUNTER — Ambulatory Visit: Payer: PPO | Admitting: Family Medicine

## 2017-05-06 VITALS — BP 118/70 | HR 79 | Temp 98.0°F | Wt 170.6 lb

## 2017-05-06 DIAGNOSIS — M5440 Lumbago with sciatica, unspecified side: Secondary | ICD-10-CM | POA: Diagnosis not present

## 2017-05-06 DIAGNOSIS — M542 Cervicalgia: Secondary | ICD-10-CM | POA: Insufficient documentation

## 2017-05-06 DIAGNOSIS — F3131 Bipolar disorder, current episode depressed, mild: Secondary | ICD-10-CM | POA: Diagnosis not present

## 2017-05-06 DIAGNOSIS — G8929 Other chronic pain: Secondary | ICD-10-CM

## 2017-05-06 HISTORY — DX: Cervicalgia: M54.2

## 2017-05-06 MED ORDER — CYCLOBENZAPRINE HCL 10 MG PO TABS: 10.0000 mg | ORAL_TABLET | Freq: Three times a day (TID) | ORAL | 5 refills | Status: DC | PRN

## 2017-05-06 MED ORDER — FLUOXETINE HCL 20 MG PO TABS: 20.0000 mg | ORAL_TABLET | Freq: Every day | ORAL | 3 refills | Status: DC

## 2017-05-06 MED ORDER — HYDROCODONE-ACETAMINOPHEN 10-325 MG PO TABS: ORAL_TABLET | ORAL | 0 refills | Status: DC

## 2017-05-06 NOTE — Progress Notes (Signed)
   Subjective:    Patient ID: [name] GibbonsShelia Kay [name], female    DOB: Jan 17, 1956, 61 y.o.   MRN: [id number]  HPI Here to discuss her depression and her neck pain. She has known arthritis of the cervical spine and she has stiffness and pain in the neck. Also she gets muscle spasms if the neck and upper shoulders. She has been talking Lamictal and Seroquel for several years for bipolar depression and she does not have the mood swings she used to have. However lately she has been more depressed than usual, with feelings of sadness and hopelessness. No suicidal thoughts.    Review of Systems  Constitutional: Negative.   Respiratory: Negative.   Cardiovascular: Negative.   Musculoskeletal: Positive for back pain, neck pain and neck stiffness.  Psychiatric/Behavioral: Positive for dysphoric mood. Negative for self-injury and suicidal ideas.       Objective:   Physical Exam  Constitutional: She is oriented to person, place, and time. She appears well-developed and well-nourished.  Neck: No thyromegaly present.  Cardiovascular: Normal rate, regular rhythm, normal heart sounds and intact distal pulses.  Pulmonary/Chest: Effort normal and breath sounds normal. No respiratory distress. She has no wheezes. She has no rales.  Musculoskeletal:  ROM of the neck is limited by pain, she is tender on the posterior neck and over both trapezei   Lymphadenopathy:    She has no cervical adenopathy.  Neurological: She is alert and oriented to person, place, and time.  Psychiatric: She has a normal mood and affect. Her behavior is normal. Judgment and thought content normal.          Assessment & Plan:  Her back pain is stable and we will refill her Norco. She has neck pain with spasm so she will try Flexeril and heat. For the bipolar depression we will add Prozac 20 mg daily to her regimen. Recheck one month. Gershon CraneStephen Leane Loring, MD

## 2017-05-07 ENCOUNTER — Telehealth: Payer: Self-pay | Admitting: *Deleted

## 2017-05-07 NOTE — Telephone Encounter (Signed)
-----   Message from Quintella Reichertraci R Turner, MD sent at 04/28/2017  8:46 PM EST ----- AHI too high - please order 2 week autotitration from 4 to 20cm H2O

## 2017-05-07 NOTE — Telephone Encounter (Signed)
That should not have resulted in her apneas - I would still like to get an autotitraiton but can wait a few weeks

## 2017-05-07 NOTE — Telephone Encounter (Signed)
Patient says she has been having a hard time because her mom passed Friday and she thinks that greatly contributed to her apnea events. She was at 11.7 when you ordered the auto now she is 9.7.  Patient also lost power for 12 hrs. Patient would like to wait a few weeks because she feels she is getting back on track.

## 2017-05-15 ENCOUNTER — Telehealth: Payer: Self-pay | Admitting: Family Medicine

## 2017-05-15 DIAGNOSIS — M545 Low back pain: Secondary | ICD-10-CM

## 2017-05-15 NOTE — Telephone Encounter (Signed)
Mr. [name] SampleJohn Mullan dropped off a [employer] form for pt after completion would like for it to be mailed back to patient in the provided self addressed stamped envelope.  Form was put in the doctor's folder for completion.

## 2017-05-16 NOTE — Telephone Encounter (Signed)
Will send this to [place] as this is a Dr Clent RidgesFry patient.

## 2017-05-17 NOTE — Telephone Encounter (Signed)
We have the form however, Dr. Clent RidgesFry has not had the chance to fill out will mail back to pt once done.

## 2017-05-17 NOTE — Telephone Encounter (Signed)
Called pt and left a VM that once the form is filled out we will mail it to the pt.

## 2017-05-31 ENCOUNTER — Telehealth: Payer: Self-pay | Admitting: Family Medicine

## 2017-05-31 NOTE — Telephone Encounter (Signed)
Sent to Dr. Clent RidgesFry placed for you to complete in your pile of paper work.

## 2017-05-31 NOTE — Telephone Encounter (Signed)
Copied from CRM #[id number]. Topic: General - Other >> May 31, 2017 12:14 PM Percival SpanishKennedy, Cheryl W wrote:   Pt called to ask if they paperwork they dropped off is ready for pick up or if it has been faxed    [phone]

## 2017-05-31 NOTE — Telephone Encounter (Signed)
Copied from CRM #[id number]. Topic: Quick Communication - See Telephone Encounter >> May 31, 2017  4:41 PM Rudi CocoLathan, Dionysios Massman M, NT wrote: CRM for notification. See Telephone encounter for:   05/31/17. Pt called to ask if they paperwork they dropped off is ready for pick up or if it has been faxed   [phone]

## 2017-06-03 NOTE — Telephone Encounter (Signed)
This is ready to mail

## 2017-06-03 NOTE — Telephone Encounter (Signed)
Placed to be scanned into pt's chart. Placed to be mailed to pt. Pt advised form is done and placed to be mailed to them.

## 2017-06-04 ENCOUNTER — Other Ambulatory Visit: Payer: Self-pay | Admitting: Family Medicine

## 2017-06-05 ENCOUNTER — Ambulatory Visit: Payer: PPO | Admitting: Family Medicine

## 2017-06-05 ENCOUNTER — Encounter: Payer: Self-pay | Admitting: Family Medicine

## 2017-06-05 VITALS — BP 122/60 | HR 88 | Temp 98.0°F | Wt 171.0 lb

## 2017-06-05 DIAGNOSIS — G8929 Other chronic pain: Secondary | ICD-10-CM | POA: Diagnosis not present

## 2017-06-05 DIAGNOSIS — M5440 Lumbago with sciatica, unspecified side: Secondary | ICD-10-CM

## 2017-06-05 DIAGNOSIS — F1190 Opioid use, unspecified, uncomplicated: Secondary | ICD-10-CM | POA: Diagnosis not present

## 2017-06-05 MED ORDER — HYDROCODONE-ACETAMINOPHEN 10-325 MG PO TABS: ORAL_TABLET | ORAL | 0 refills | Status: DC

## 2017-06-05 NOTE — Progress Notes (Signed)
   Subjective:    Patient ID: [name] [name], female    DOB: February 09, 1956, 61 y.o.   MRN: [id number]  HPI Here for a pain management visit. She has chronic low back pain which has been fairly stable.  Indication for chronic opioid: low back pain Medication and dose: Norco 10-325 # pills per month: 120 Last UDS date: 06-05-17 Pain contract signed (Y/N): 06-05-17 Date narcotic database last reviewed (include red flags): 06-05-17     Review of Systems  Constitutional: Negative.   Respiratory: Negative.   Cardiovascular: Negative.   Musculoskeletal: Positive for back pain.       Objective:   Physical Exam  Constitutional: She is oriented to person, place, and time. She appears well-developed and well-nourished.  Cardiovascular: Normal rate, regular rhythm, normal heart sounds and intact distal pulses.  Pulmonary/Chest: Effort normal and breath sounds normal. No respiratory distress. She has no wheezes. She has no rales.  Neurological: She is alert and oriented to person, place, and time.          Assessment & Plan:  Low back pain, stable. She plans to stop taking Flexeril because it makes her drowsy and doesn't help much with her pain. Refills were given for the Norco.  Gershon CraneStephen Yerik Zeringue, MD

## 2017-06-05 NOTE — Telephone Encounter (Signed)
This was done 05/31/2017 pt is aware see other telephone encounter messages. Pt is coming in today I will give her a copy at her OV.

## 2017-06-09 LAB — PAIN MGMT, PROFILE 8 W/CONF, U
6 Acetylmorphine: NEGATIVE ng/mL (ref <10)
Alcohol Metabolites: NEGATIVE ng/mL (ref <500)
Alphahydroxyalprazolam: >1000 ng/mL — ABNORMAL HIGH (ref <25)
Alphahydroxymidazolam: NEGATIVE ng/mL (ref <50)
Alphahydroxytriazolam: NEGATIVE ng/mL (ref <50)
Aminoclonazepam: NEGATIVE ng/mL (ref <25)
Amphetamines: NEGATIVE ng/mL (ref <500)
Benzodiazepines: POSITIVE ng/mL — AB (ref <100)
Buprenorphine, Urine: NEGATIVE ng/mL (ref <5)
Cocaine Metabolite: NEGATIVE ng/mL (ref <150)
Codeine: NEGATIVE ng/mL (ref <50)
Creatinine: 121.8 mg/dL
Hydrocodone: 3065 ng/mL — ABNORMAL HIGH (ref <50)
Hydromorphone: 225 ng/mL — ABNORMAL HIGH (ref <50)
Hydroxyethylflurazepam: NEGATIVE ng/mL (ref <50)
Lorazepam: NEGATIVE ng/mL (ref <50)
MDMA: NEGATIVE ng/mL (ref <500)
Marijuana Metabolite: NEGATIVE ng/mL (ref <20)
Morphine: NEGATIVE ng/mL (ref <50)
Nordiazepam: NEGATIVE ng/mL (ref <50)
Norhydrocodone: 3899 ng/mL — ABNORMAL HIGH (ref <50)
Opiates: POSITIVE ng/mL — AB (ref <100)
Oxazepam: NEGATIVE ng/mL (ref <50)
Oxidant: NEGATIVE ug/mL (ref <200)
Oxycodone: NEGATIVE ng/mL (ref <100)
Temazepam: NEGATIVE ng/mL (ref <50)
pH: 5.79 (ref 4.5–9.0)

## 2017-06-20 DIAGNOSIS — M4726 Other spondylosis with radiculopathy, lumbar region: Secondary | ICD-10-CM | POA: Diagnosis not present

## 2017-06-20 DIAGNOSIS — M4316 Spondylolisthesis, lumbar region: Secondary | ICD-10-CM | POA: Diagnosis not present

## 2017-06-20 DIAGNOSIS — Z981 Arthrodesis status: Secondary | ICD-10-CM | POA: Diagnosis not present

## 2017-06-20 DIAGNOSIS — I700 Atherosclerosis of aorta: Secondary | ICD-10-CM | POA: Diagnosis not present

## 2017-06-20 DIAGNOSIS — M419 Scoliosis, unspecified: Secondary | ICD-10-CM | POA: Diagnosis not present

## 2017-06-20 DIAGNOSIS — M5011 Cervical disc disorder with radiculopathy,  high cervical region: Secondary | ICD-10-CM | POA: Diagnosis not present

## 2017-06-20 DIAGNOSIS — M4720 Other spondylosis with radiculopathy, site unspecified: Secondary | ICD-10-CM | POA: Diagnosis not present

## 2017-06-20 DIAGNOSIS — M4727 Other spondylosis with radiculopathy, lumbosacral region: Secondary | ICD-10-CM | POA: Diagnosis not present

## 2017-06-24 NOTE — Telephone Encounter (Signed)
Copied from CRM [phone]. Topic: General - Other >> Jun 24, 2017 12:28 PM Stephannie LiSimmons, Janett L, NT wrote: Reason for CRM: Patient need a copy of claim form [facility] faxed over that says   she is  still disabled, they are saying they never received the PCP part that was filled out by him , please fax   [phone] , her id  for this is   [phone] please call her at  [phone] for any questions

## 2017-06-25 NOTE — Telephone Encounter (Signed)
Called and spoke with the pt. Told her that we mailed her the paper work as she had requested. Pt asked that we fax it as well. Sent to be faxed and a copy placed up front for the pt to come by and pick up.

## 2017-06-28 ENCOUNTER — Other Ambulatory Visit: Payer: Self-pay | Admitting: Family Medicine

## 2017-07-01 ENCOUNTER — Ambulatory Visit: Payer: Self-pay | Admitting: Cardiology

## 2017-07-02 ENCOUNTER — Telehealth: Payer: Self-pay

## 2017-07-02 MED ORDER — OMEPRAZOLE 20 MG PO CPDR: 20.0000 mg | DELAYED_RELEASE_CAPSULE | Freq: Two times a day (BID) | ORAL | 5 refills | Status: DC

## 2017-07-02 NOTE — Telephone Encounter (Signed)
Pharmacy: [facility]   Refill request for omeprazole 20 MG cap.

## 2017-07-02 NOTE — Telephone Encounter (Signed)
Last OV 06/05/2017.   Rx was last refilled 05/28/2016 disp 60 with 6 refills   Rx sent
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
## 2017-07-09 ENCOUNTER — Ambulatory Visit (INDEPENDENT_AMBULATORY_CARE_PROVIDER_SITE_OTHER): Payer: PPO

## 2017-07-09 ENCOUNTER — Telehealth: Payer: Self-pay

## 2017-07-09 VITALS — BP 110/70 | HR 75 | Ht 61.0 in | Wt 173.0 lb

## 2017-07-09 DIAGNOSIS — Z0000 Encounter for general adult medical examination without abnormal findings: Secondary | ICD-10-CM | POA: Diagnosis not present

## 2017-07-09 NOTE — Patient Instructions (Addendum)
[name] [name] , Thank you for taking time to come for your Medicare Wellness Visit. I appreciate your ongoing commitment to your health goals. Please review the following plan we discussed and let me know if I can assist you in the future.   You took your zoster in 2017 Shingrix is a vaccine for the prevention of Shingles in Adults 33 and older.  If you are on Medicare, you can request a prescription from your doctor to be filled at a pharmacy.  Please check with your benefits regarding applicable copays or out of pocket expenses.  The Shingrix is given in 2 vaccines approx 8 weeks apart. You must receive the 2nd dose prior to 6 months from receipt of the first.    May want to make an apt with Dr. Glennon Hamilton. [phone]  May want to see Dr. Sarajane Jews for falls, can't hold her balance  States the rehab center was not helping   These are the goals we discussed: Goals    . Exercise 150 min/wk Moderate Activity     Will try to go to silver sneakers        This is a list of the screening recommended for you and due dates:  Health Maintenance  Topic Date Due  . Pap Smear  07/19/2017*  . Mammogram  11/10/2018  . Colon Cancer Screening  04/22/2021  . Tetanus Vaccine  04/21/2023  . Flu Shot  Completed  .  Hepatitis C: One time screening is recommended by Center for Disease Control  (CDC) for  adults born from [birth year] through 1965.   Completed  . HIV Screening  Completed  *Topic was postponed. The date shown is not the original due date.   Community Occupational psychologist of Services Cost  A Matter of Balance Class locations vary. Call [place] on Aging for more information.  [email or web address] [phone] 8-Session program addressing the fear of falling and increasing activity levels of older adults Free to minimal cost  A.C.T. By [employer] [address], [place], [place] [address].  BetaBlues.dk [phone]  Personal training, gym,  classes including Silver Sneakers* and ACTion for Aging Adults Fee-based  A.H.O.Y. (Add Health to [place]) Airs on Time [employer] 13, M-F at [place]: [employer],  [place] [facility] [place],  [place], [place] [facility], [address] [facility], [place], [place], [place] [address]  [place] Location: Sharrell Ku. [employer] [place]      [phone]  [phone]  [phone]  [phone]  [phone]  [phone]  [phone]  [phone]  [phone]  [phone]    [phone] A total-body conditioning class for adults 67 and older; designed to increase muscular strength, endurance, range of movement, flexibility, balance, agility and coordination Free  [facility] [place], [place] [address] [place]      [address]      [phone]      Pilate's class for individualsreturning to exercise after an injury, before or after surgery or for individuals with complex musculoskeletal issues; designed to improve strength, balance , flexibility      $  [place] [address] [place], [place] [address] [email or web address] [place] classes for beginners to advanced [place], [place] [address] [email or web address] [place] Chair Exercises Free, ages 54 and older; Ages 22-59 fee based   Marvia Pickles, [facility] [address] [place], [place] [address] [email or web address] .Beverlee Nims [phone]  A.H.O.Y. Tai Chi Fee-based Donation based or free  [place] Class locations vary.  Call or email Angela Burke or view website for more information. [email or web address] .[email or web address] [email or web address] [phone] Ongoing classes at local YMC[employer] and gyms Fee-based  Silver Sneakers A.C.T. By [place] [employer]: [place] [facility] [place] [phone]  [phone] Classes designed for older adults who want to improve their strength, flexibility, balance and endurance.   Silver sneakers is covered by some insurance plans and includes a fitness center membership at participating locations. Find out more by calling [phone] or visiting [email or web address] Covered by some insurance plans  [facility] [phone] A.H.O.Y., fitness room, personal training, fitness classes for injury prevention, strength, balance, flexibility, water fitness classes Ages 55+: $53 for 6 months; Ages 21-54: $39 for 6 months  Tai Chi for Everybody [facility] [address] [place], [place] [address] [email or web address] .Patsi Sears [phone] Tai Chi classes for beginners to advanced; geared for seniors Donation Based      [employer] (Hel[place] Others Participate in Exercise     Loyal Gambler. Rosana Hoes, PhD, [place] [email or web address] .[email or web address] [place]     [phone]     A comprehensive fitness program for adults.  The program [employer]is senior-level undergraduates Kinesiology students with adults who  desire to learn how to exercise safely.  Includes a structural exercise class focusing on functional fitnesss     $100/semester in fall and spring; $75 in summer (no trainers)    *Silver Sneakers is covered by some Personal assistant and includes a  Radio producer at participating locations.  Find out more by calling [phone] or visiting [email or web address]  For additional health and human services resources for senior adults, please contact SeniorLine at [phone] in [place] and [place] at [phone] in all other areas.   Fall Prevention in the Home Falls can cause injuries. They can happen to people of all ages. There are many things you can do to make your home safe and to help prevent falls. What can I do on the outside of my home?  Regularly fix the edges of walkways and driveways and fix any cracks.  Remove anything that might make you trip as you walk through a door, such as a raised step or threshold.  Trim any bushes or trees on the path to your home.  Use bright outdoor lighting.  Clear any walking paths of anything that might make someone trip, such as rocks or tools.  Regularly check to see if handrails are loose or broken. Make sure that both sides of any steps have handrails.  Any raised decks and porches should have guardrails on the edges.  Have any leaves, snow, or ice cleared regularly.  Use sand or salt on walking paths during winter.  Clean up any  spills in your garage right away. This includes oil or grease spills. What can I do in the bathroom?  Use night lights.  Install grab bars by the toilet and in the tub and shower. Do not use towel bars as grab bars.  Use non-skid mats or decals in the tub or shower.  If you need to sit down in the shower, use a plastic, non-slip stool.  Keep the floor dry. Clean up any water that spills on the floor as soon as it happens.  Remove soap buildup in the tub or shower regularly.  Attach bath  mats securely with double-sided non-slip rug tape.  Do not have throw rugs and other things on the floor that can make you trip. What can I do in the bedroom?  Use night lights.  Make sure that you have a light by your bed that is easy to reach.  Do not use any sheets or blankets that are too big for your bed. They should not hang down onto the floor.  Have a firm chair that has side arms. You can use this for support while you get dressed.  Do not have throw rugs and other things on the floor that can make you trip. What can I do in the kitchen?  Clean up any spills right away.  Avoid walking on wet floors.  Keep items that you use a lot in easy-to-reach places.  If you need to reach something above you, use a strong step stool that has a grab bar.  Keep electrical cords out of the way.  Do not use floor polish or wax that makes floors slippery. If you must use wax, use non-skid floor wax.  Do not have throw rugs and other things on the floor that can make you trip. What can I do with my stairs?  Do not leave any items on the stairs.  Make sure that there are handrails on both sides of the stairs and use them. Fix handrails that are broken or loose. Make sure that handrails are as long as the stairways.  Check any carpeting to make sure that it is firmly attached to the stairs. Fix any carpet that is loose or worn.  Avoid having throw rugs at the top or bottom of the stairs. If you do have throw rugs, attach them to the floor with carpet tape.  Make sure that you have a light switch at the top of the stairs and the bottom of the stairs. If you do not have them, ask someone to add them for you. What else can I do to help prevent falls?  Wear shoes that: ? Do not have high heels. ? Have rubber bottoms. ? Are comfortable and fit you well. ? Are closed at the toe. Do not wear sandals.  If you use a stepladder: ? Make sure that it is fully opened. Do not climb a closed  stepladder. ? Make sure that both sides of the stepladder are locked into place. ? Ask someone to hold it for you, if possible.  Clearly mark and make sure that you can see: ? Any grab bars or handrails. ? First and last steps. ? Where the edge of each step is.  Use tools that help you move around (mobility aids) if they are needed. These include: ? Canes. ? Walkers. ? Scooters. ? Crutches.  Turn on the lights when you go into a dark area. Replace any light bulbs as soon as they burn  out.  Set up your furniture so you have a clear path. Avoid moving your furniture around.  If any of your floors are uneven, fix them.  If there are any pets around you, be aware of where they are.  Review your medicines with your doctor. Some medicines can make you feel dizzy. This can increase your chance of falling. Ask your doctor what other things that you can do to help prevent falls. This information is not intended to replace advice given to you by your health care provider. Make sure you discuss any questions you have with your health care provider. Document Released: 03/31/2009 Document Revised: 11/10/2015 Document Reviewed: 07/09/2014 Elsevier Interactive Patient Education  2018 [place] Maintenance, Female Adopting a healthy lifestyle and getting preventive care can go a long way to promote health and wellness. Talk with your health care provider about what schedule of regular examinations is right for you. This is a good chance for you to check in with your provider about disease prevention and staying healthy. In between checkups, there are plenty of things you can do on your own. Experts have done a lot of research about which lifestyle changes and preventive measures are most likely to keep you healthy. Ask your health care provider for more information. Weight and diet Eat a healthy diet  Be sure to include plenty of vegetables, fruits, low-fat dairy products, and lean  protein.  Do not eat a lot of foods high in solid fats, added sugars, or salt.  Get regular exercise. This is one of the most important things you can do for your health. ? Most adults should exercise for at least 150 minutes each week. The exercise should increase your heart rate and make you sweat (moderate-intensity exercise). ? Most adults should also do strengthening exercises at least twice a week. This is in addition to the moderate-intensity exercise.  Maintain a healthy weight  Body mass index (BMI) is a measurement that can be used to identify possible weight problems. It estimates body fat based on height and weight. Your health care provider can help determine your BMI and help you achieve or maintain a healthy weight.  For females 55 years of age and older: ? A BMI below 18.5 is considered underweight. ? A BMI of 18.5 to 24.9 is normal. ? A BMI of 25 to 29.9 is considered overweight. ? A BMI of 30 and above is considered obese.  Watch levels of cholesterol and blood lipids  You should start having your blood tested for lipids and cholesterol at 63 years of age, then have this test every 5 years.  You may need to have your cholesterol levels checked more often if: ? Your lipid or cholesterol levels are high. ? You are older than 63 years of age. ? You are at high risk for heart disease.  Cancer screening Lung Cancer  Lung cancer screening is recommended for adults 72-71 years old who are at high risk for lung cancer because of a history of smoking.  A yearly low-dose CT scan of the lungs is recommended for people who: ? Currently smoke. ? Have quit within the past 15 years. ? Have at least a 30-pack-year history of smoking. A pack year is smoking an average of one pack of cigarettes a day for 1 year.  Yearly screening should continue until it has been 15 years since you quit.  Yearly screening should stop if you develop a health problem that would prevent  you from  having lung cancer treatment.  Breast Cancer  Practice breast self-awareness. This means understanding how your breasts normally appear and feel.  It also means doing regular breast self-exams. Let your health care provider know about any changes, no matter how small.  If you are in your 20s or 30s, you should have a clinical breast exam (CBE) by a health care provider every 1-3 years as part of a regular health exam.  If you are 48 or older, have a CBE every year. Also consider having a breast X-ray (mammogram) every year.  If you have a family history of breast cancer, talk to your health care provider about genetic screening.  If you are at high risk for breast cancer, talk to your health care provider about having an MRI and a mammogram every year.  Breast cancer gene (BRCA) assessment is recommended for women who have family members with BRCA-related cancers. BRCA-related cancers include: ? Breast. ? Ovarian. ? Tubal. ? Peritoneal cancers.  Results of the assessment will determine the need for genetic counseling and BRCA1 and BRCA2 testing.  Cervical Cancer Your health care provider may recommend that you be screened regularly for cancer of the pelvic organs (ovaries, uterus, and vagina). This screening involves a pelvic examination, including checking for microscopic changes to the surface of your cervix (Pap test). You may be encouraged to have this screening done every 3 years, beginning at age 23.  For women ages 58-65, health care providers may recommend pelvic exams and Pap testing every 3 years, or they may recommend the Pap and pelvic exam, combined with testing for human papilloma virus (HPV), every 5 years. Some types of HPV increase your risk of cervical cancer. Testing for HPV may also be done on women of any age with unclear Pap test results.  Other health care providers may not recommend any screening for nonpregnant women who are considered low risk for pelvic cancer  and who do not have symptoms. Ask your health care provider if a screening pelvic exam is right for you.  If you have had past treatment for cervical cancer or a condition that could lead to cancer, you need Pap tests and screening for cancer for at least 20 years after your treatment. If Pap tests have been discontinued, your risk factors (such as having a new sexual partner) need to be reassessed to determine if screening should resume. Some women have medical problems that increase the chance of getting cervical cancer. In these cases, your health care provider may recommend more frequent screening and Pap tests.  Colorectal Cancer  This type of cancer can be detected and often prevented.  Routine colorectal cancer screening usually begins at 63 years of age and continues through 63 years of age.  Your health care provider may recommend screening at an earlier age if you have risk factors for colon cancer.  Your health care provider may also recommend using home test kits to check for hidden blood in the stool.  A small camera at the end of a tube can be used to examine your colon directly (sigmoidoscopy or colonoscopy). This is done to check for the earliest forms of colorectal cancer.  Routine screening usually begins at age 35.  Direct examination of the colon should be repeated every 5-10 years through 63 years of age. However, you may need to be screened more often if early forms of precancerous polyps or small growths are found.  Skin Cancer  Check your skin  from head to toe regularly.  Tell your health care provider about any new moles or changes in moles, especially if there is a change in a mole's shape or color.  Also tell your health care provider if you have a mole that is larger than the size of a pencil eraser.  Always use sunscreen. Apply sunscreen liberally and repeatedly throughout the day.  Protect yourself by wearing long sleeves, pants, a wide-brimmed hat, and  sunglasses whenever you are outside.  Heart disease, diabetes, and high blood pressure  High blood pressure causes heart disease and increases the risk of stroke. High blood pressure is more likely to develop in: ? People who have blood pressure in the high end of the normal range (130-139/85-89 mm Hg). ? People who are overweight or obese. ? People who are African American.  If you are 46-32 years of age, have your blood pressure checked every 3-5 years. If you are 27 years of age or older, have your blood pressure checked every year. You should have your blood pressure measured twice-once when you are at a hospital or clinic, and once when you are not at a hospital or clinic. Record the average of the two measurements. To check your blood pressure when you are not at a hospital or clinic, you can use: ? An automated blood pressure machine at a pharmacy. ? A home blood pressure monitor.  If you are between 30 years and 19 years old, ask your health care provider if you should take aspirin to prevent strokes.  Have regular diabetes screenings. This involves taking a blood sample to check your fasting blood sugar level. ? If you are at a normal weight and have a low risk for diabetes, have this test once every three years after 63 years of age. ? If you are overweight and have a high risk for diabetes, consider being tested at a younger age or more often. Preventing infection Hepatitis B  If you have a higher risk for hepatitis B, you should be screened for this virus. You are considered at high risk for hepatitis B if: ? You were born in a country where hepatitis B is common. Ask your health care provider which countries are considered high risk. ? Your parents were born in a high-risk country, and you have not been immunized against hepatitis B (hepatitis B vaccine). ? You have HIV or AIDS. ? You use needles to inject street drugs. ? You live with someone who has hepatitis B. ? You have  had sex with someone who has hepatitis B. ? You get hemodialysis treatment. ? You take certain medicines for conditions, including cancer, organ transplantation, and autoimmune conditions.  Hepatitis C  Blood testing is recommended for: ? Everyone born from [birth year] through 1966. ? Anyone with known risk factors for hepatitis C.  Sexually transmitted infections (STIs)  You should be screened for sexually transmitted infections (STIs) including gonorrhea and chlamydia if: ? You are sexually active and are younger than 63 years of age. ? You are older than 63 years of age and your health care provider tells you that you are at risk for this type of infection. ? Your sexual activity has changed since you were last screened and you are at an increased risk for chlamydia or gonorrhea. Ask your health care provider if you are at risk.  If you do not have HIV, but are at risk, it may be recommended that you take a prescription medicine daily to  prevent HIV infection. This is called pre-exposure prophylaxis (PrEP). You are considered at risk if: ? You are sexually active and do not regularly use condoms or know the HIV status of your partner(s). ? You take drugs by injection. ? You are sexually active with a partner who has HIV.  Talk with your health care provider about whether you are at high risk of being infected with HIV. If you choose to begin PrEP, you should first be tested for HIV. You should then be tested every 3 months for as long as you are taking PrEP. Pregnancy  If you are premenopausal and you may become pregnant, ask your health care provider about preconception counseling.  If you may become pregnant, take 400 to 800 micrograms (mcg) of folic acid every day.  If you want to prevent pregnancy, talk to your health care provider about birth control (contraception). Osteoporosis and menopause  Osteoporosis is a disease in which the bones lose minerals and strength with aging. This  can result in serious bone fractures. Your risk for osteoporosis can be identified using a bone density scan.  If you are 65 years of age or older, or if you are at risk for osteoporosis and fractures, ask your health care provider if you should be screened.  Ask your health care provider whether you should take a calcium or vitamin D supplement to lower your risk for osteoporosis.  Menopause may have certain physical symptoms and risks.  Hormone replacement therapy may reduce some of these symptoms and risks. Talk to your health care provider about whether hormone replacement therapy is right for you. Follow these instructions at home:  Schedule regular health, dental, and eye exams.  Stay current with your immunizations.  Do not use any tobacco products including cigarettes, chewing tobacco, or electronic cigarettes.  If you are pregnant, do not drink alcohol.  If you are breastfeeding, limit how much and how often you drink alcohol.  Limit alcohol intake to no more than 1 drink per day for nonpregnant women. One drink equals 12 ounces of beer, 5 ounces of wine, or 1 ounces of hard liquor.  Do not use street drugs.  Do not share needles.  Ask your health care provider for help if you need support or information about quitting drugs.  Tell your health care provider if you often feel depressed.  Tell your health care provider if you have ever been abused or do not feel safe at home. This information is not intended to replace advice given to you by your health care provider. Make sure you discuss any questions you have with your health care provider. Document Released: 12/18/2010 Document Revised: 11/10/2015 Document Reviewed: 03/08/2015 Elsevier Interactive Patient Education  [employer].

## 2017-07-09 NOTE — Telephone Encounter (Signed)
In for AWV with caregiver and significant other Phq was 7 To see Dr. Jason FilaBray but will fup in 30 days (2/22) to confirm she went or to schedule apt with dr. Clent RidgesFry

## 2017-07-17 DIAGNOSIS — N6310 Unspecified lump in the right breast, unspecified quadrant: Secondary | ICD-10-CM | POA: Diagnosis not present

## 2017-07-17 DIAGNOSIS — R309 Painful micturition, unspecified: Secondary | ICD-10-CM | POA: Diagnosis not present

## 2017-07-17 DIAGNOSIS — Z6831 Body mass index (BMI) 31.0-31.9, adult: Secondary | ICD-10-CM | POA: Diagnosis not present

## 2017-07-17 DIAGNOSIS — Z01419 Encounter for gynecological examination (general) (routine) without abnormal findings: Secondary | ICD-10-CM | POA: Diagnosis not present

## 2017-07-17 DIAGNOSIS — N3945 Continuous leakage: Secondary | ICD-10-CM | POA: Diagnosis not present

## 2017-07-30 DIAGNOSIS — M542 Cervicalgia: Secondary | ICD-10-CM | POA: Diagnosis not present

## 2017-07-30 DIAGNOSIS — Z6831 Body mass index (BMI) 31.0-31.9, adult: Secondary | ICD-10-CM | POA: Diagnosis not present

## 2017-07-30 DIAGNOSIS — M47812 Spondylosis without myelopathy or radiculopathy, cervical region: Secondary | ICD-10-CM | POA: Diagnosis not present

## 2017-08-05 ENCOUNTER — Other Ambulatory Visit: Payer: Self-pay | Admitting: Orthopaedic Surgery

## 2017-08-05 DIAGNOSIS — M47812 Spondylosis without myelopathy or radiculopathy, cervical region: Secondary | ICD-10-CM

## 2017-08-09 ENCOUNTER — Ambulatory Visit
Admission: RE | Admit: 2017-08-09 | Discharge: 2017-08-09 | Disposition: A | Discharge status: Home / Self Care | Payer: PPO | Source: Ambulatory Visit | Attending: Orthopaedic Surgery | Admitting: Orthopaedic Surgery

## 2017-08-09 ENCOUNTER — Telehealth: Payer: Self-pay

## 2017-08-09 DIAGNOSIS — M47812 Spondylosis without myelopathy or radiculopathy, cervical region: Secondary | ICD-10-CM | POA: Diagnosis not present

## 2017-08-09 NOTE — Telephone Encounter (Addendum)
LMTCB if she will do the titration.

## 2017-08-09 NOTE — Telephone Encounter (Signed)
Fup on review for apt with Dr. Jason FilaBray following AWV. The patient does have an apt scheduled with Dr. Erik ObeyBray  [name] Andreoni RN

## 2017-08-18 NOTE — Progress Notes (Signed)
Cardiology Office Note:    Date:  08/21/2017   ID:  [name] [name], DOB [birth year], MRN [id number]  PCP:  Nelwyn Salisbury, MD  Cardiologist:  No primary care provider on file.    Referring MD: Nelwyn Salisbury, MD   Chief Complaint  Patient presents with  . Sleep Apnea  . Hypertension    History of Present Illness:    [name] [name] is a 62 y.o. female with a hx of OSA on CPAP therapy.  She is doing well with her CPAP device.  She tolerates the nasal pillow mask and feels the pressure is adequate.  She is sleeping great and never wakes up at night.  She sleeps up to 10 hours.  Since going on CPAP she feels rested in the am and has no significant daytime sleepiness but occasionally will take a nap during the day due to some pain meds she is taking. She says that she feels like she has a new lease on life.   She is having problems with dry mouth.  She does not think that he snores.     Past Medical History:  Diagnosis Date  . Allergy    seasonal  . Bipolar 1 disorder (HCC)   . CPAP (continuous positive airway pressure) dependence   . Depression    sees the [facility]   . GERD (gastroesophageal reflux disease)   . Hyperlipidemia   . Hypertension   . Memory loss    short term  . Obesity (BMI 30-39.9) 11/10/2015  . PPD positive 2007 or 2008  . Sleep apnea    on CPAP therapy  . Urinary incontinence   . Vestibular disorder     Past Surgical History:  Procedure Laterality Date  . ABDOMINAL HYSTERECTOMY    . ANTERIOR FUSION CERVICAL SPINE  2006   Dr. Wynetta Emery  . BREAST BIOPSY Right 1981   benign  . BREAST EXCISIONAL BIOPSY Right 1981   benign   scar does not show  . BREAST SURGERY Bilateral 12-11-12   reductions per Dr. Ivin Booty in [facility]   . CATARACT EXTRACTION, BILATERAL Bilateral last summer of 2018   at [facility]  . COLONOSCOPY  04-23-11   per Dr. Loreta Ave, adenomatous polyps, repeat in 5 yrs   . ESOPHAGOGASTRODUODENOSCOPY  04-23-11   per Dr. Loreta Ave,  normal   . LUMBAR DISC SURGERY  2003   Dr. Wynetta Emery  . SPLIT NIGHT STUDY  11/11/2015  . UVULOPALATOPHARYNGOPLASTY    . VAGINAL HYSTERECTOMY  12/96   secondary to fibroids.    Current Medications: Current Meds  Medication Sig  . acyclovir (ZOVIRAX) 400 MG tablet TAKE 1 TABLET BY MOUTH 4 TIMES A DAY AS NEEDED  . alprazolam (XANAX) 2 MG tablet Take 1 tablet (2 mg total) by mouth 3 (three) times daily as needed.  [name] azelastine (ASTELIN) 0.1 % nasal spray Place 2 sprays into both nostrils 2 (two) times daily. Use in each nostril as directed  . CALCIUM-MAGNESIUM-ZINC PO Take by mouth daily.  . Cholecalciferol (VITAMIN D3) 2000 units capsule Take 2,000 Units by mouth daily.  . CycloSPORINE (RESTASIS OP) Apply to eye daily.  [name] FLUoxetine (PROZAC) 20 MG tablet Take 1 tablet (20 mg total) daily by mouth.  . fluticasone (FLONASE) 50 MCG/ACT nasal spray Place 1 spray into both nostrils 2 (two) times daily.  [name] HYDROcodone-acetaminophen (NORCO) 10-325 MG tablet TAKE 1 TABLET BY MOUTH EVERY 6 HOURS AS NEEDED FOR MODERATE PAIN. (MAY FILL 03/24/16)  .  lamoTRIgine (LAMICTAL) 100 MG tablet TAKE 1 TABLET BY MOUTH 2 TIMES A DAY. (Patient taking differently: TAKE 1 TABLET BY MOUTH once a day)  . Multiple Vitamin (MULTIVITAMIN) tablet Take 1 tablet by mouth daily.    . NON FORMULARY   . Omega-3 Fatty Acids (FISH OIL PO) Take by mouth daily.  [name] omeprazole (PRILOSEC) 20 MG capsule Take 1 capsule (20 mg total) by mouth 2 (two) times daily before a meal.  . polyethylene glycol powder (GLYCOLAX/MIRALAX) powder TAKE 17 GRAMS BY MOUTH 2 TIMES DAILY AS NEEDED.  [name] Probiotic Product (PROBIOTIC PO) Take by mouth daily.  . QUEtiapine (SEROQUEL) 50 MG tablet Take 1 tablet (50 mg total) by mouth at bedtime.  . Saline (SIMPLY SALINE) 0.9 % AERS Place 1 spray into both nostrils daily as needed.  . simvastatin (ZOCOR) 40 MG tablet TAKE 1 TABLET BY MOUTH EVERY NIGHT AT BEDTIME  . TURMERIC PO Take by mouth daily.     Allergies:    Sulfonamide derivatives; Flexeril [cyclobenzaprine]; Morphine and related; Sulfa antibiotics; and Sulfamethoxazole   Social History   Socioeconomic History  . Marital status: Divorced    Spouse name: None  . Number of children: 2  . Years of education: None  . Highest education level: None  Social Needs  . Financial resource strain: None  . Food insecurity - worry: None  . Food insecurity - inability: None  . Transportation needs - medical: None  . Transportation needs - non-medical: None  Occupational History  . Occupation: Disabled  Tobacco Use  . Smoking status: Former Smoker    Packs/day: 0.25    Years: 30.00    Pack years: 7.50    Types: Cigarettes  . Smokeless tobacco: Never Used  . Tobacco comment: stopped smoking; CBD vapor  Substance and Sexual Activity  . Alcohol use: No    Alcohol/week: 0.0 oz  . Drug use: No  . Sexual activity: None  Other Topics Concern  . None  Social History Narrative   Pt lives in [place], [place].     Family History: The patient's family history includes Alcohol abuse in her unknown relative; Arthritis in her unknown relative; Asthma in her father; Coronary artery disease in her brother, sister, and unknown relative; Dementia in her mother; Depression in her unknown relative; Diabetes in her unknown relative; Hyperlipidemia in her brother, mother, sister, sister, and unknown relative; Hypertension in her brother, sister, sister, and unknown relative; Sudden death in her unknown relative; Thyroid disease in her sister.  ROS:   Please see the history of present illness.    ROS  All other systems reviewed and negative.   EKGs/Labs/Other Studies Reviewed:    The following studies were reviewed today: PAP download  EKG:  EKG is not ordered today.    Recent Labs: 10/23/2016: ALT 18; BUN 14; Creatinine, Ser 0.62; Hemoglobin 13.5; Platelets 206.0; Potassium 4.1; Sodium 138; TSH 1.08   Recent Lipid Panel    Component Value Date/Time   CHOL  187 06/06/2016 1113   TRIG 142 06/06/2016 1113   HDL 51 06/06/2016 1113   CHOLHDL 3.7 06/06/2016 1113   VLDL 28 06/06/2016 1113   LDLCALC 108 (H) 06/06/2016 1113   LDLDIRECT 99.0 08/08/2015 1351    Physical Exam:    VS:  BP 128/78   Pulse 80   Ht 5\' 1"  (1.549 m)   Wt 175 lb (79.4 kg)   SpO2 97%   BMI 33.07 kg/m     Wt Readings  from Last 3 Encounters:  08/21/17 175 lb (79.4 kg)  07/09/17 173 lb (78.5 kg)  06/05/17 171 lb (77.6 kg)     GEN:  Well nourished, well developed in no acute distress HEENT: Normal NECK: No JVD; No carotid bruits LYMPHATICS: No lymphadenopathy CARDIAC: RRR, no murmurs, rubs, gallops RESPIRATORY:  Clear to auscultation without rales, wheezing or rhonchi  ABDOMEN: Soft, non-tender, non-distended MUSCULOSKELETAL:  No edema; No deformity  SKIN: Warm and dry NEUROLOGIC:  Alert and oriented x 3 PSYCHIATRIC:  Normal affect   ASSESSMENT:    1. Obstructive sleep apnea syndrome   2. Essential hypertension   3. Obesity (BMI 30-39.9)    PLAN:    In order of problems listed above:  1.  OSA - the patient is tolerating PAP therapy well without any problems. The PAP download was reviewed today and showed an AHI of 24.1/hr on 10 cm H2O with 87% compliance in using more than 4 hours nightly.  The patient has been using and benefiting from PAP use and will continue to benefit from therapy. Her AHI is too high and could be due to mouth breathing so I will add a chin strap and get a 2 week autotitration from 4 to 20cm H2O.  2.  HTN - BP is well controlled on exam today.  She is not on antihypertensive meds.   3.  Obesity - I have encouraged her to get into a routine exercise program and cut back on carbs and portions.   Medication Adjustments/Labs and Tests Ordered: Current medicines are reviewed at length with the patient today.  Concerns regarding medicines are outlined above.  No orders of the defined types were placed in this encounter.  No orders of the  defined types were placed in this encounter.   Signed, Armanda Magicraci Kion Huntsberry, MD  08/21/2017 9:15 AM    [facility]

## 2017-08-19 ENCOUNTER — Other Ambulatory Visit: Payer: Self-pay | Admitting: General Surgery

## 2017-08-19 DIAGNOSIS — G4730 Sleep apnea, unspecified: Secondary | ICD-10-CM | POA: Diagnosis not present

## 2017-08-19 DIAGNOSIS — E785 Hyperlipidemia, unspecified: Secondary | ICD-10-CM | POA: Diagnosis not present

## 2017-08-19 DIAGNOSIS — F319 Bipolar disorder, unspecified: Secondary | ICD-10-CM | POA: Diagnosis not present

## 2017-08-19 DIAGNOSIS — Z6832 Body mass index (BMI) 32.0-32.9, adult: Secondary | ICD-10-CM | POA: Diagnosis not present

## 2017-08-19 DIAGNOSIS — Z9889 Other specified postprocedural states: Secondary | ICD-10-CM | POA: Diagnosis not present

## 2017-08-19 DIAGNOSIS — N6310 Unspecified lump in the right breast, unspecified quadrant: Secondary | ICD-10-CM | POA: Diagnosis not present

## 2017-08-21 ENCOUNTER — Encounter: Payer: Self-pay | Admitting: Cardiology

## 2017-08-21 ENCOUNTER — Ambulatory Visit: Payer: PPO | Admitting: Cardiology

## 2017-08-21 VITALS — BP 128/78 | HR 80 | Ht 61.0 in | Wt 175.0 lb

## 2017-08-21 DIAGNOSIS — G4733 Obstructive sleep apnea (adult) (pediatric): Secondary | ICD-10-CM | POA: Diagnosis not present

## 2017-08-21 DIAGNOSIS — I10 Essential (primary) hypertension: Secondary | ICD-10-CM

## 2017-08-21 DIAGNOSIS — E669 Obesity, unspecified: Secondary | ICD-10-CM | POA: Diagnosis not present

## 2017-08-21 NOTE — Patient Instructions (Signed)
Medication Instructions:  Your physician recommends that you continue on your current medications as directed. Please refer to the Current Medication list given to you today.  If you need a refill on your cardiac medications, please contact your pharmacy first.  Labwork: None ordered   Testing/Procedures: None ordered   Follow-Up: Your physician wants you to follow-up in: 1 year with Dr. Turner. You will receive a reminder letter in the mail two months in advance. If you don't receive a letter, please call our office to schedule the follow-up appointment.  Any Other Special Instructions Will Be Listed Below (If Applicable).   Thank you for choosing [facility]    Rena Tacha Manni, RN  [phone]  If you need a refill on your cardiac medications before your next appointment, please call your pharmacy.   

## 2017-08-22 DIAGNOSIS — G4733 Obstructive sleep apnea (adult) (pediatric): Secondary | ICD-10-CM | POA: Diagnosis not present

## 2017-08-22 DIAGNOSIS — M47812 Spondylosis without myelopathy or radiculopathy, cervical region: Secondary | ICD-10-CM | POA: Diagnosis not present

## 2017-08-22 DIAGNOSIS — M4322 Fusion of spine, cervical region: Secondary | ICD-10-CM | POA: Diagnosis not present

## 2017-08-22 DIAGNOSIS — M542 Cervicalgia: Secondary | ICD-10-CM | POA: Diagnosis not present

## 2017-08-23 ENCOUNTER — Telehealth: Payer: Self-pay | Admitting: *Deleted

## 2017-08-23 NOTE — Telephone Encounter (Signed)
Patient has agreed to so the auto titration. Titration has been sent to pre cert.

## 2017-08-23 NOTE — Telephone Encounter (Signed)
Chin strap 2 week auto-titration from 4-20 cm H2O [facility]

## 2017-08-23 NOTE — Telephone Encounter (Signed)
-----   Message from Burlington[place]nisha Robertson, [place] sent at 08/21/2017  9:24 AM EST ----- Regarding: dme order dme order placed and 2 week auto-titration from 4-20 cm H2O  Thanks  Rena

## 2017-08-30 ENCOUNTER — Other Ambulatory Visit: Payer: Self-pay | Admitting: General Surgery

## 2017-08-30 DIAGNOSIS — L720 Epidermal cyst: Secondary | ICD-10-CM | POA: Diagnosis not present

## 2017-08-30 DIAGNOSIS — N6001 Solitary cyst of right breast: Secondary | ICD-10-CM | POA: Diagnosis not present

## 2017-09-03 ENCOUNTER — Telehealth: Payer: Self-pay

## 2017-09-03 NOTE — Telephone Encounter (Signed)
[facility]ax 2 MG tablet  Last OV 06/05/2017   Last refilled 02/28/2017 disp 90 with 5 refills   Sent to PCP for approval

## 2017-09-04 MED ORDER — ALPRAZOLAM 2 MG PO TABS: 2.0000 mg | ORAL_TABLET | Freq: Three times a day (TID) | ORAL | 5 refills | Status: DC | PRN

## 2017-09-04 NOTE — Telephone Encounter (Signed)
Call in #90 with 5 rf 

## 2017-09-09 ENCOUNTER — Ambulatory Visit: Payer: Self-pay | Admitting: Family Medicine

## 2017-09-09 ENCOUNTER — Telehealth: Payer: Self-pay | Admitting: Family Medicine

## 2017-09-09 DIAGNOSIS — Z0289 Encounter for other administrative examinations: Secondary | ICD-10-CM

## 2017-09-09 NOTE — Telephone Encounter (Signed)
Copied from CRM [phone]. Topic: Quick Communication - See Telephone Encounter >> Sep 09, 2017 11:52 AM Windy KalataMichael, Vanna Sailer L, NT wrote: CRM for notification. See Telephone encounter for: 09/09/17.  Patient is calling and requesting a refill on HYDROcodone-acetaminophen (NORCO) 10-325 MG tablet  please advise. Contact pt when it is filled.   [facility] [place] [address]  [address] [place] [address]  Phone: [phone] Fax: [phone]

## 2017-09-09 NOTE — Telephone Encounter (Signed)
Called pt and stated that she will need to call to be scheduled for a pain management appt for more refills.

## 2017-09-11 DIAGNOSIS — M47812 Spondylosis without myelopathy or radiculopathy, cervical region: Secondary | ICD-10-CM | POA: Diagnosis not present

## 2017-09-13 ENCOUNTER — Other Ambulatory Visit: Payer: Self-pay | Admitting: Family Medicine

## 2017-09-13 NOTE — Telephone Encounter (Signed)
Refill on Hydrocodone-acetaminophen 10-325 MG LOV 06/05/17 PCP Dr. Clent RidgesFry

## 2017-09-13 NOTE — Telephone Encounter (Signed)
Copied from CRM [id number]. Topic: Quick Communication - Rx Refill/Question >> Sep 13, 2017  9:29 AM Landry MellowFoltz, Melissa J wrote: Medication: HYDROcodone-acetaminophen (NORCO) 10-325 MG tablet Has the patient contacted their pharmacy? No. (Agent: If no, request that the patient contact the pharmacy for the refill.) [facility] (with phone number [facility] street name[facility]  Agent: Please be advised that RX refills may take up to 3 business days. We ask that you follow-up with your pharmacy.

## 2017-09-17 ENCOUNTER — Ambulatory Visit (INDEPENDENT_AMBULATORY_CARE_PROVIDER_SITE_OTHER): Payer: PPO | Admitting: Family Medicine

## 2017-09-17 ENCOUNTER — Encounter: Payer: Self-pay | Admitting: Family Medicine

## 2017-09-17 VITALS — BP 138/76 | HR 78 | Temp 98.2°F | Wt 173.6 lb

## 2017-09-17 DIAGNOSIS — M5440 Lumbago with sciatica, unspecified side: Secondary | ICD-10-CM | POA: Diagnosis not present

## 2017-09-17 DIAGNOSIS — F1190 Opioid use, unspecified, uncomplicated: Secondary | ICD-10-CM | POA: Diagnosis not present

## 2017-09-17 DIAGNOSIS — G8929 Other chronic pain: Secondary | ICD-10-CM

## 2017-09-17 MED ORDER — HYDROCODONE-ACETAMINOPHEN 10-325 MG PO TABS: 1.0000 | ORAL_TABLET | Freq: Four times a day (QID) | ORAL | 0 refills | Status: DC | PRN

## 2017-09-17 MED ORDER — HYDROCODONE-ACETAMINOPHEN 10-325 MG PO TABS: 1.0000 | ORAL_TABLET | Freq: Four times a day (QID) | ORAL | 0 refills | Status: AC | PRN

## 2017-09-17 NOTE — Progress Notes (Signed)
   Subjective:    Patient ID: [name] GibbonsShelia Kay [name], female    DOB: [birth year], 62 y.o.   MRN: [id number]  HPI Here for pain management. She is doing well.  Indication for chronic opioid: low back pain Medication and dose: Norco 10-325 # pills per month: 120 Last UDS date: 06-05-17 Opioid Treatment Agreement signed (Y/N): 09-17-17 Opioid Treatment Agreement last reviewed with patient:  09-17-17 NCCSRS reviewed this encounter (include red flags):  09-17-17     Review of Systems  Constitutional: Negative.   Respiratory: Negative.   Cardiovascular: Negative.   Musculoskeletal: Positive for back pain.  Neurological: Negative.        Objective:   Physical Exam  Constitutional: She is oriented to person, place, and time. She appears well-developed and well-nourished.  Cardiovascular: Normal rate, regular rhythm, normal heart sounds and intact distal pulses.  Pulmonary/Chest: Effort normal and breath sounds normal. No respiratory distress. She has no wheezes. She has no rales.  Neurological: She is alert and oriented to person, place, and time.          Assessment & Plan:  Pain management. Meds were refilled.  Gershon CraneStephen Fry, MD

## 2017-09-24 ENCOUNTER — Other Ambulatory Visit: Payer: Self-pay | Admitting: Family Medicine

## 2017-09-25 ENCOUNTER — Telehealth: Payer: Self-pay | Admitting: *Deleted

## 2017-09-25 ENCOUNTER — Other Ambulatory Visit: Payer: Self-pay | Admitting: Family Medicine

## 2017-09-25 DIAGNOSIS — G4733 Obstructive sleep apnea (adult) (pediatric): Secondary | ICD-10-CM

## 2017-09-25 NOTE — Telephone Encounter (Signed)
   Notes recorded by Henrietta DineKemp, Kathryn A, RN on 09/24/2017 at 7:27 AM EDT To CPAP assistant to arrange. ------  Notes recorded by Quintella Reicherturner, Traci R, MD on 09/23/2017 at 8:41 PM EDT Increase CPAP to 11cm H2O and repeat download in 6 weeks

## 2017-09-25 NOTE — Telephone Encounter (Signed)
-----   Message from Henrietta DineKathryn A Kemp, RN sent at 09/24/2017  7:27 AM EDT ----- To CPAP assistant to arrange.

## 2017-09-25 NOTE — Telephone Encounter (Signed)
Gave patient recommended pressure changes made by Dr. Mayford Knifeurner and  understanding was verbalized. Patient understands her pressure will change to 11 cm H20 and we will repeat a download in 6 weeks. Patient is agreeable to treatment.

## 2017-09-26 NOTE — Telephone Encounter (Signed)
Last OV 09/17/2017   Last refilled 06/25/2016 disp 120 with 5 refills  Sent to PCP for approval

## 2017-10-01 DIAGNOSIS — M47812 Spondylosis without myelopathy or radiculopathy, cervical region: Secondary | ICD-10-CM | POA: Diagnosis not present

## 2017-10-07 ENCOUNTER — Ambulatory Visit: Payer: PPO | Admitting: Psychology

## 2017-10-07 ENCOUNTER — Telehealth: Payer: Self-pay | Admitting: *Deleted

## 2017-10-07 DIAGNOSIS — F3132 Bipolar disorder, current episode depressed, moderate: Secondary | ICD-10-CM | POA: Diagnosis not present

## 2017-10-07 NOTE — Telephone Encounter (Signed)
-----   Message from Quintella Reichertraci R Turner, MD sent at 10/05/2017  4:08 PM EDT ----- Please find out what mask patient is wearing and if she is sleeping on her back.  It appears she has a large mask leak so need to see DME to look at her mask and avoid sleeping supine and then repeat download in 4 weeks

## 2017-10-07 NOTE — Telephone Encounter (Signed)
Called results lmtcb. 

## 2017-10-14 ENCOUNTER — Other Ambulatory Visit: Payer: Self-pay | Admitting: Family Medicine

## 2017-10-14 NOTE — Telephone Encounter (Signed)
Last OV 09/17/2017   Last refilled 07/31/2016 disp 60 with 11 refills   Sent to PCP for approval

## 2017-10-16 ENCOUNTER — Ambulatory Visit: Payer: PPO | Admitting: Psychology

## 2017-10-16 ENCOUNTER — Telehealth: Payer: Self-pay | Admitting: Family Medicine

## 2017-10-16 DIAGNOSIS — R413 Other amnesia: Secondary | ICD-10-CM

## 2017-10-16 DIAGNOSIS — F3132 Bipolar disorder, current episode depressed, moderate: Secondary | ICD-10-CM | POA: Diagnosis not present

## 2017-10-16 DIAGNOSIS — F411 Generalized anxiety disorder: Secondary | ICD-10-CM

## 2017-10-16 NOTE — Telephone Encounter (Signed)
Copied from CRM [id number]. Topic: Quick Communication - See Telephone Encounter >> Oct 16, 2017  2:13 PM Cipriano Bunker wrote: CRM for notification.   Pt. Is asking for a referral for [facility], Neurologist.  The one she had expired.   See Telephone encounter for: 10/16/17.

## 2017-10-16 NOTE — Telephone Encounter (Signed)
Sent to PCP to set up referral  

## 2017-10-17 ENCOUNTER — Encounter: Payer: Self-pay | Admitting: Psychology

## 2017-10-17 ENCOUNTER — Encounter: Payer: Self-pay | Admitting: *Deleted

## 2017-10-17 ENCOUNTER — Telehealth: Payer: Self-pay | Admitting: *Deleted

## 2017-10-17 NOTE — Telephone Encounter (Signed)
-----   Message from [name], MD sent at 10/05/2017  4:08 PM EDT ----- Please find out what mask patient is wearing and if she is sleeping on her back.  It appears she has a large mask leak so need to see DME to look at her mask and avoid sleeping supine and then repeat download in 4 weeks

## 2017-10-17 NOTE — Telephone Encounter (Signed)
The referral was done  

## 2017-10-17 NOTE — Telephone Encounter (Signed)
Unable to reach patient no contact letter has been sent.

## 2017-10-17 NOTE — Telephone Encounter (Signed)
Called pt and left a VM that referral has been done.

## 2017-10-21 ENCOUNTER — Telehealth: Payer: Self-pay | Admitting: Family Medicine

## 2017-10-21 NOTE — Telephone Encounter (Signed)
Copied from CRM [id number]. Topic: Quick Communication - See Telephone Encounter >> Oct 21, 2017 11:46 AM Waymon Amato wrote: Pharmacy is calling to get a diagnosis code for norco and needs to speak with a provider in regards to verify that they are aware that both alprazelam and the norco are prescribed  Best number [phone] for pharmacy

## 2017-10-21 NOTE — Addendum Note (Signed)
Addended by: Reesa Chew on: 10/21/2017 03:03 PM   Modules accepted: Orders

## 2017-10-22 NOTE — Telephone Encounter (Signed)
See attached note from the pharmacy regarding Norco and alprazelam.  Dr. Clent Ridges

## 2017-10-22 NOTE — Telephone Encounter (Signed)
Sent to you as an FYI with the pharmacy's concerns with Norco and xanax

## 2017-10-22 NOTE — Telephone Encounter (Signed)
Sent to PCP to advise   Dx code should be:  Chronic bilateral low back pain with sciatica, sciatica laterality unspecified  M54.40, G89.29

## 2017-10-23 DIAGNOSIS — M47812 Spondylosis without myelopathy or radiculopathy, cervical region: Secondary | ICD-10-CM | POA: Diagnosis not present

## 2017-10-31 ENCOUNTER — Telehealth: Payer: Self-pay | Admitting: Family Medicine

## 2017-10-31 NOTE — Telephone Encounter (Signed)
Copied from CRM [id number]. Topic: Quick Communication - See Telephone Encounter >> Oct 31, 2017  3:02 PM Waymon Amato wrote: Pt is wanting to see if dr fry would put her back on adderall   Best number [phone]

## 2017-10-31 NOTE — Telephone Encounter (Signed)
Spoke with Nettie Elm at [facility] regarding pt request; instructed to route request to office for provider review; last office visit 09/17/17; will route per request,

## 2017-11-01 NOTE — Telephone Encounter (Signed)
She should have an OV to discuss this

## 2017-11-01 NOTE — Telephone Encounter (Signed)
Please advise, would like to see her for OV to discuss?

## 2017-11-01 NOTE — Telephone Encounter (Signed)
Called patient and left message to return call

## 2017-11-02 DIAGNOSIS — H40033 Anatomical narrow angle, bilateral: Secondary | ICD-10-CM | POA: Diagnosis not present

## 2017-11-02 DIAGNOSIS — H43393 Other vitreous opacities, bilateral: Secondary | ICD-10-CM | POA: Diagnosis not present

## 2017-11-04 ENCOUNTER — Ambulatory Visit (INDEPENDENT_AMBULATORY_CARE_PROVIDER_SITE_OTHER): Payer: PPO | Admitting: Psychology

## 2017-11-04 DIAGNOSIS — F411 Generalized anxiety disorder: Secondary | ICD-10-CM | POA: Diagnosis not present

## 2017-11-04 DIAGNOSIS — F3132 Bipolar disorder, current episode depressed, moderate: Secondary | ICD-10-CM | POA: Diagnosis not present

## 2017-11-04 NOTE — Telephone Encounter (Signed)
Attempted to call patient again and received a recording that "the number you are trying to call is not reachable."

## 2017-11-05 ENCOUNTER — Ambulatory Visit: Payer: PPO | Admitting: Family Medicine

## 2017-11-05 NOTE — Telephone Encounter (Signed)
Patient has appt scheduled for today.

## 2017-11-08 ENCOUNTER — Ambulatory Visit (INDEPENDENT_AMBULATORY_CARE_PROVIDER_SITE_OTHER): Payer: PPO | Admitting: Family Medicine

## 2017-11-08 ENCOUNTER — Encounter: Payer: Self-pay | Admitting: Family Medicine

## 2017-11-08 VITALS — BP 130/80 | HR 65 | Temp 98.0°F | Ht 61.0 in | Wt 173.6 lb

## 2017-11-08 DIAGNOSIS — F3132 Bipolar disorder, current episode depressed, moderate: Secondary | ICD-10-CM | POA: Diagnosis not present

## 2017-11-08 DIAGNOSIS — M542 Cervicalgia: Secondary | ICD-10-CM

## 2017-11-08 DIAGNOSIS — I10 Essential (primary) hypertension: Secondary | ICD-10-CM | POA: Diagnosis not present

## 2017-11-08 DIAGNOSIS — R2689 Other abnormalities of gait and mobility: Secondary | ICD-10-CM | POA: Diagnosis not present

## 2017-11-08 DIAGNOSIS — F419 Anxiety disorder, unspecified: Secondary | ICD-10-CM | POA: Diagnosis not present

## 2017-11-08 DIAGNOSIS — G4700 Insomnia, unspecified: Secondary | ICD-10-CM | POA: Diagnosis not present

## 2017-11-08 DIAGNOSIS — G8929 Other chronic pain: Secondary | ICD-10-CM

## 2017-11-08 DIAGNOSIS — R5382 Chronic fatigue, unspecified: Secondary | ICD-10-CM

## 2017-11-08 DIAGNOSIS — R739 Hyperglycemia, unspecified: Secondary | ICD-10-CM

## 2017-11-08 DIAGNOSIS — M5440 Lumbago with sciatica, unspecified side: Secondary | ICD-10-CM | POA: Diagnosis not present

## 2017-11-08 LAB — POC URINALSYSI DIPSTICK (AUTOMATED)
Bilirubin, UA: NEGATIVE
Blood, UA: NEGATIVE
Glucose, UA: NEGATIVE
Ketones, UA: NEGATIVE
Leukocytes, UA: NEGATIVE
Nitrite, UA: NEGATIVE
Protein, UA: NEGATIVE
Spec Grav, UA: 1.01 (ref 1.010–1.025)
Urobilinogen, UA: 0.2 E.U./dL
pH, UA: 7 (ref 5.0–8.0)

## 2017-11-08 NOTE — Progress Notes (Signed)
   Subjective:    Patient ID: [name] [name], female    DOB: [birth year], 62 y.o.   MRN: [id number]  HPI Here to discuss chronic fatigue and depression. She has been meeting with Dr. Olga Millers, and she has been referred to Dr. Daneen Schick for neuropsychological testing. She has struggled with depression since the death of her mother last year. She describes constant fatigue, poor memory, and trouble thinking clearly. Today we are taking about whether some of this could be due to medication side effects. She was started on Prozac 20 mg daily last November, and she has taken this in the past without trouble. Then over this past winter we added Lamictal and titrated this up to 200 mg a day. The patient then took herself back down to 100 mg a day, where she stands today. We also added Seroquel at a low dose of 50 mg at bedtime. She has found this very helpful for her sleep. Then In January she started vaping CBD oil over the counter as a way to relax. Her husband noticed that she became much more sedated and confused with this, and he has convinced her to stop vaping. She stopped one week ago, and they have already seen some improvement. As for the Xanax she takes for anxiety, they agree that she averages 1/2 to 1 tab a day only. She also takes Norco for pain, but she limites this to 1-2 tabs a day. She recently had radio frequency ablation (RFA) to the neck and this has been very effective for the neck pain.    Review of Systems  Constitutional: Positive for fatigue.  Respiratory: Negative.   Cardiovascular: Negative.   Neurological: Positive for weakness.  Psychiatric/Behavioral: Positive for confusion, decreased concentration and dysphoric mood. The patient is nervous/anxious.        Objective:   Physical Exam  Constitutional: She is oriented to person, place, and time. She appears well-developed and well-nourished.  Cardiovascular: Normal rate, regular rhythm, normal heart sounds and intact  distal pulses.  Pulmonary/Chest: Effort normal and breath sounds normal.  Neurological: She is alert and oriented to person, place, and time.  Psychiatric: She has a normal mood and affect. Her behavior is normal. Thought content normal.          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 She will continue to meet with Dr. Jason Fila for therapy. We will continue the Prozac, but we will stop the Lamictal entirely. This may be contributing to her issues. Stay on Seroquel for now. Her husband will monitor her use of Xanax and Norco. Get labs today to include a CBC, TSH, B12 etc. Recheck in 2 weeks.  Gershon Crane, MD

## 2017-11-09 ENCOUNTER — Encounter: Payer: Self-pay | Admitting: Psychology

## 2017-11-09 LAB — LIPID PANEL
Cholesterol: 241 mg/dL — ABNORMAL HIGH (ref <200)
HDL: 53 mg/dL (ref >50)
LDL Cholesterol (Calc): 149 mg/dL (calc) — ABNORMAL HIGH
Non-HDL Cholesterol (Calc): 188 mg/dL (calc) — ABNORMAL HIGH (ref <130)
Total CHOL/HDL Ratio: 4.5 (calc) (ref <5.0)
Triglycerides: 254 mg/dL — ABNORMAL HIGH (ref <150)

## 2017-11-09 LAB — CBC WITH DIFFERENTIAL/PLATELET
Basophils Absolute: 30 cells/uL (ref 0–200)
Basophils Relative: 0.4 %
Eosinophils Absolute: 190 cells/uL (ref 15–500)
Eosinophils Relative: 2.5 %
HCT: 35.2 % (ref 35.0–45.0)
Hemoglobin: 11.9 g/dL (ref 11.7–15.5)
Lymphs Abs: 2143 cells/uL (ref 850–3900)
MCH: 29.9 pg (ref 27.0–33.0)
MCHC: 33.8 g/dL (ref 32.0–36.0)
MCV: 88.4 fL (ref 80.0–100.0)
MPV: 11.9 fL (ref 7.5–12.5)
Monocytes Relative: 6.1 %
Neutro Abs: 4773 cells/uL (ref 1500–7800)
Neutrophils Relative %: 62.8 %
Platelets: 160 10*3/uL (ref 140–400)
RBC: 3.98 10*6/uL (ref 3.80–5.10)
RDW: 13.4 % (ref 11.0–15.0)
Total Lymphocyte: 28.2 %
WBC mixed population: 464 cells/uL (ref 200–950)
WBC: 7.6 10*3/uL (ref 3.8–10.8)

## 2017-11-09 LAB — BASIC METABOLIC PANEL
BUN: 11 mg/dL (ref 7–25)
CO2: 31 mmol/L (ref 20–32)
Calcium: 9.6 mg/dL (ref 8.6–10.4)
Chloride: 101 mmol/L (ref 98–110)
Creat: 0.68 mg/dL (ref 0.50–0.99)
Glucose, Bld: 82 mg/dL (ref 65–99)
Potassium: 3.9 mmol/L (ref 3.5–5.3)
Sodium: 140 mmol/L (ref 135–146)

## 2017-11-09 LAB — VITAMIN B12: Vitamin B-12: 408 pg/mL (ref 200–1100)

## 2017-11-09 LAB — HEMOGLOBIN A1C
Hgb A1c MFr Bld: 5.6 % of total Hgb (ref <5.7)
Mean Plasma Glucose: 114 (calc)
eAG (mmol/L): 6.3 (calc)

## 2017-11-09 LAB — HEPATIC FUNCTION PANEL
AG Ratio: 1.7 (calc) (ref 1.0–2.5)
ALT: 12 U/L (ref 6–29)
AST: 18 U/L (ref 10–35)
Albumin: 4.5 g/dL (ref 3.6–5.1)
Alkaline phosphatase (APISO): 60 U/L (ref 33–130)
Bilirubin, Direct: 0.1 mg/dL (ref 0.0–0.2)
Globulin: 2.7 g/dL (calc) (ref 1.9–3.7)
Indirect Bilirubin: 0.2 mg/dL (calc) (ref 0.2–1.2)
Total Bilirubin: 0.3 mg/dL (ref 0.2–1.2)
Total Protein: 7.2 g/dL (ref 6.1–8.1)

## 2017-11-09 LAB — TSH: TSH: 2.5 mIU/L (ref 0.40–4.50)

## 2017-11-09 LAB — MAGNESIUM: Magnesium: 2.1 mg/dL (ref 1.5–2.5)

## 2017-11-10 ENCOUNTER — Encounter: Payer: Self-pay | Admitting: Family Medicine

## 2017-11-14 NOTE — Telephone Encounter (Signed)
I have cancelled all remaining refills for her controlled medications, and we will have her come in to talk about this

## 2017-11-18 ENCOUNTER — Other Ambulatory Visit: Payer: Self-pay | Admitting: *Deleted

## 2017-11-18 ENCOUNTER — Ambulatory Visit (INDEPENDENT_AMBULATORY_CARE_PROVIDER_SITE_OTHER): Payer: PPO | Admitting: Psychology

## 2017-11-18 DIAGNOSIS — F411 Generalized anxiety disorder: Secondary | ICD-10-CM | POA: Diagnosis not present

## 2017-11-18 DIAGNOSIS — E785 Hyperlipidemia, unspecified: Secondary | ICD-10-CM

## 2017-11-18 DIAGNOSIS — F3132 Bipolar disorder, current episode depressed, moderate: Secondary | ICD-10-CM

## 2017-11-19 ENCOUNTER — Ambulatory Visit: Payer: PPO | Admitting: Psychology

## 2017-11-19 DIAGNOSIS — H26492 Other secondary cataract, left eye: Secondary | ICD-10-CM | POA: Diagnosis not present

## 2017-11-22 ENCOUNTER — Other Ambulatory Visit: Payer: Self-pay | Admitting: *Deleted

## 2017-11-22 ENCOUNTER — Encounter: Payer: Self-pay | Admitting: *Deleted

## 2017-11-22 DIAGNOSIS — E785 Hyperlipidemia, unspecified: Secondary | ICD-10-CM

## 2017-11-26 MED ORDER — SIMVASTATIN 80 MG PO TABS: 80.0000 mg | ORAL_TABLET | Freq: Every day | ORAL | 3 refills | Status: DC

## 2017-12-05 ENCOUNTER — Ambulatory Visit (INDEPENDENT_AMBULATORY_CARE_PROVIDER_SITE_OTHER): Payer: PPO | Admitting: Psychology

## 2017-12-05 DIAGNOSIS — F3132 Bipolar disorder, current episode depressed, moderate: Secondary | ICD-10-CM

## 2017-12-05 DIAGNOSIS — F411 Generalized anxiety disorder: Secondary | ICD-10-CM | POA: Diagnosis not present

## 2017-12-18 ENCOUNTER — Telehealth: Payer: Self-pay | Admitting: Family Medicine

## 2017-12-18 NOTE — Telephone Encounter (Signed)
Last Rx given on 6/6 for #120 -no ref

## 2017-12-18 NOTE — Telephone Encounter (Signed)
Copied from CRM [id number]. Topic: Quick Communication - Rx Refill/Question >> Dec 18, 2017  1:47 PM Leafy Roobinson, Norma J wrote: Medication: hydrocodone Has the patient contacted their pharmacy? No Agent: If yes, when and what did the pharmacy advise?)  [facility] Agent: Please be advised that RX refills may take up to 3 business days. We ask that you follow-up with your pharmacy.

## 2017-12-20 ENCOUNTER — Ambulatory Visit: Payer: Self-pay | Admitting: Psychology

## 2017-12-20 NOTE — Telephone Encounter (Signed)
This needs to be done as part of a pain management visit. We can do this today if she wishes

## 2017-12-20 NOTE — Telephone Encounter (Signed)
I called the pt and informed her of the message below.  Patient stated she is unable to come in today as she is going out of town.  Follow up appt scheduled for 7/10.

## 2017-12-25 ENCOUNTER — Ambulatory Visit (INDEPENDENT_AMBULATORY_CARE_PROVIDER_SITE_OTHER): Payer: PPO | Admitting: Family Medicine

## 2017-12-25 ENCOUNTER — Encounter: Payer: Self-pay | Admitting: Family Medicine

## 2017-12-25 VITALS — BP 102/66 | HR 83 | Temp 98.5°F | Ht 61.0 in | Wt 174.8 lb

## 2017-12-25 DIAGNOSIS — F1190 Opioid use, unspecified, uncomplicated: Secondary | ICD-10-CM | POA: Diagnosis not present

## 2017-12-25 DIAGNOSIS — G8929 Other chronic pain: Secondary | ICD-10-CM

## 2017-12-25 DIAGNOSIS — M5440 Lumbago with sciatica, unspecified side: Secondary | ICD-10-CM | POA: Diagnosis not present

## 2017-12-25 MED ORDER — HYDROCODONE-ACETAMINOPHEN 10-325 MG PO TABS: 1.0000 | ORAL_TABLET | Freq: Four times a day (QID) | ORAL | 0 refills | Status: DC | PRN

## 2017-12-25 MED ORDER — HYDROCODONE-ACETAMINOPHEN 10-325 MG PO TABS: 1.0000 | ORAL_TABLET | Freq: Four times a day (QID) | ORAL | 0 refills | Status: AC | PRN

## 2017-12-25 NOTE — Progress Notes (Signed)
   Subjective:    Patient ID: [name] [name], female    DOB: September 22, 1955, 62 y.o.   MRN: [id number]  HPI Here for pain management. She is doing well.  Indication for chronic opioid: low back pain Medication and dose: Norco 10-325  # pills per month: 120 Last UDS date: 06-05-17 Opioid Treatment Agreement signed (Y/N): 09-17-17 Opioid Treatment Agreement last reviewed with patient:  12-25-17 NCCSRS reviewed this encounter (include red flags):  12-25-17    Review of Systems  Constitutional: Negative.   Respiratory: Negative.   Cardiovascular: Negative.   Musculoskeletal: Positive for back pain.  Neurological: Negative.        Objective:   Physical Exam  Constitutional: She is oriented to person, place, and time. She appears well-developed and well-nourished.  Cardiovascular: Normal rate, regular rhythm, normal heart sounds and intact distal pulses.  Pulmonary/Chest: Effort normal and breath sounds normal.  Neurological: She is alert and oriented to person, place, and time.          Assessment & Plan:  Pain management, meds were refilled.  Gershon CraneStephen Elois Averitt, MD

## 2018-01-09 ENCOUNTER — Ambulatory Visit: Payer: PPO | Admitting: Psychology

## 2018-01-09 DIAGNOSIS — F411 Generalized anxiety disorder: Secondary | ICD-10-CM

## 2018-01-09 DIAGNOSIS — F3132 Bipolar disorder, current episode depressed, moderate: Secondary | ICD-10-CM | POA: Diagnosis not present

## 2018-01-14 ENCOUNTER — Other Ambulatory Visit: Payer: Self-pay | Admitting: Family Medicine

## 2018-01-16 ENCOUNTER — Other Ambulatory Visit: Payer: Self-pay | Admitting: Family Medicine

## 2018-02-11 ENCOUNTER — Other Ambulatory Visit: Payer: Self-pay | Admitting: Family Medicine

## 2018-02-21 ENCOUNTER — Other Ambulatory Visit: Payer: Self-pay | Admitting: Family Medicine

## 2018-02-21 NOTE — Telephone Encounter (Signed)
Dr. Clent Ridges  Please advise on the refill of the xanax  Last refilled on 09/04/2017  For #90 with 5 refills Last ov was 12/25/2017  Thanks

## 2018-02-24 ENCOUNTER — Telehealth: Payer: Self-pay | Admitting: Family Medicine

## 2018-02-24 ENCOUNTER — Ambulatory Visit: Payer: PPO | Admitting: *Deleted

## 2018-02-24 ENCOUNTER — Ambulatory Visit: Payer: PPO | Admitting: Psychology

## 2018-02-24 DIAGNOSIS — F3132 Bipolar disorder, current episode depressed, moderate: Secondary | ICD-10-CM

## 2018-02-24 DIAGNOSIS — F411 Generalized anxiety disorder: Secondary | ICD-10-CM

## 2018-02-24 NOTE — Telephone Encounter (Signed)
Pt is in the office at another visit and would like to see if she can get a flu shot while in the office today.  Due to her living so far away from the office.  Is it okay for her to get it today?

## 2018-02-24 NOTE — Telephone Encounter (Signed)
Call in #90 with 5 rf 

## 2018-02-24 NOTE — Telephone Encounter (Signed)
I went and asked Dr. Clent Ridges and he stated it wasn't a problem for pt to go ahead and have flu shot.  Shot was given by Marliss Czar today.

## 2018-03-03 ENCOUNTER — Telehealth: Payer: Self-pay | Admitting: Family Medicine

## 2018-03-03 MED ORDER — FLUOXETINE HCL 10 MG PO TABS: 20.0000 mg | ORAL_TABLET | Freq: Every day | ORAL | 0 refills | Status: DC

## 2018-03-03 NOTE — Telephone Encounter (Signed)
Copied from CRM [id number]. Topic: Quick Communication - Rx Refill/Question >> Mar 03, 2018 10:09 AM Baldo DaubAlexander, Amber L wrote: Medication: FLUoxetine (PROZAC) 10 MG tablet  Has the patient contacted their pharmacy? Yes - states no refills left.  They gave her two day emergency supply to cover the weekend - but pt is completely out of medication (Agent: If no, request that the patient contact the pharmacy for the refill.) (Agent: If yes, when and what did the pharmacy advise?)  Preferred Pharmacy (with phone number or street name):   [facility] [place] - [address] [phone] (Phone) [phone] (Fax)  Agent: Please be advised that RX refills may take up to 3 business days. We ask that you follow-up with your pharmacy.

## 2018-03-07 ENCOUNTER — Encounter: Payer: Self-pay | Admitting: Family Medicine

## 2018-03-07 ENCOUNTER — Ambulatory Visit (INDEPENDENT_AMBULATORY_CARE_PROVIDER_SITE_OTHER): Payer: PPO | Admitting: Family Medicine

## 2018-03-07 VITALS — BP 140/92 | HR 58 | Temp 98.3°F | Ht 62.0 in | Wt 179.5 lb

## 2018-03-07 DIAGNOSIS — Z0000 Encounter for general adult medical examination without abnormal findings: Secondary | ICD-10-CM | POA: Diagnosis not present

## 2018-03-07 DIAGNOSIS — R739 Hyperglycemia, unspecified: Secondary | ICD-10-CM

## 2018-03-07 DIAGNOSIS — Z23 Encounter for immunization: Secondary | ICD-10-CM | POA: Diagnosis not present

## 2018-03-07 MED ORDER — FLUOXETINE HCL 10 MG PO TABS: 20.0000 mg | ORAL_TABLET | Freq: Every day | ORAL | 11 refills | Status: DC

## 2018-03-07 MED ORDER — OMEPRAZOLE 20 MG PO CPDR: 20.0000 mg | DELAYED_RELEASE_CAPSULE | Freq: Two times a day (BID) | ORAL | 11 refills | Status: DC

## 2018-03-07 NOTE — Progress Notes (Signed)
   Subjective:    Patient ID: [name] GibbonsShelia Kay [name], female    DOB: 1955-10-27, 62 y.o.   MRN: [id number]  HPI Here for a well exam. She feels well. She continues to see Dr. Olga MillersAlison Bray for psychotherapy and she is scheduled for testing with Dr. Fernanda DrumBayler of Neurology soon.    Review of Systems  Constitutional: Negative.   HENT: Negative.   Eyes: Negative.   Respiratory: Negative.   Cardiovascular: Negative.   Gastrointestinal: Negative.   Genitourinary: Negative for decreased urine volume, difficulty urinating, dyspareunia, dysuria, enuresis, flank pain, frequency, hematuria, pelvic pain and urgency.  Musculoskeletal: Negative.   Skin: Negative.   Neurological: Negative.   Psychiatric/Behavioral: Negative.        Objective:   Physical Exam  Constitutional: She is oriented to person, place, and time. She appears well-developed and well-nourished. No distress.  HENT:  Head: Normocephalic and atraumatic.  Right Ear: External ear normal.  Left Ear: External ear normal.  Nose: Nose normal.  Mouth/Throat: Oropharynx is clear and moist. No oropharyngeal exudate.  Eyes: Pupils are equal, round, and reactive to light. Conjunctivae and EOM are normal. No scleral icterus.  Neck: Normal range of motion. Neck supple. No JVD present. No thyromegaly present.  Cardiovascular: Normal rate, regular rhythm, normal heart sounds and intact distal pulses. Exam reveals no gallop and no friction rub.  No murmur heard. Pulmonary/Chest: Effort normal and breath sounds normal. No respiratory distress. She has no wheezes. She has no rales. She exhibits no tenderness.  Abdominal: Soft. Bowel sounds are normal. She exhibits no distension and no mass. There is no tenderness. There is no rebound and no guarding.  Musculoskeletal: Normal range of motion. She exhibits no edema or tenderness.  Lymphadenopathy:    She has no cervical adenopathy.  Neurological: She is alert and oriented to person, place, and time.  She has normal reflexes. She displays normal reflexes. No cranial nerve deficit. She exhibits normal muscle tone. Coordination normal.  Skin: Skin is warm and dry. No rash noted. No erythema.  Psychiatric: She has a normal mood and affect. Her behavior is normal. Judgment and thought content normal.          Assessment & Plan:  Well exam. We discussed diet and exercise. Get fasting labs.  Gershon CraneStephen Fry, MD

## 2018-03-10 ENCOUNTER — Other Ambulatory Visit (INDEPENDENT_AMBULATORY_CARE_PROVIDER_SITE_OTHER): Payer: PPO

## 2018-03-10 ENCOUNTER — Encounter: Payer: Self-pay | Admitting: Family Medicine

## 2018-03-10 DIAGNOSIS — E785 Hyperlipidemia, unspecified: Secondary | ICD-10-CM

## 2018-03-10 DIAGNOSIS — Z0000 Encounter for general adult medical examination without abnormal findings: Secondary | ICD-10-CM

## 2018-03-10 DIAGNOSIS — R739 Hyperglycemia, unspecified: Secondary | ICD-10-CM | POA: Diagnosis not present

## 2018-03-10 LAB — POC URINALSYSI DIPSTICK (AUTOMATED)
Blood, UA: NEGATIVE
Glucose, UA: NEGATIVE
Ketones, UA: NEGATIVE
Leukocytes, UA: NEGATIVE
Nitrite, UA: NEGATIVE
Protein, UA: NEGATIVE
Spec Grav, UA: 1.015 (ref 1.010–1.025)
Urobilinogen, UA: 1 E.U./dL
pH, UA: 7.5 (ref 5.0–8.0)

## 2018-03-10 LAB — BASIC METABOLIC PANEL
BUN: 9 mg/dL (ref 6–23)
CO2: 33 mEq/L — ABNORMAL HIGH (ref 19–32)
Calcium: 9.9 mg/dL (ref 8.4–10.5)
Chloride: 102 mEq/L (ref 96–112)
Creatinine, Ser: 0.67 mg/dL (ref 0.40–1.20)
GFR: 94.56 mL/min (ref >60.00)
Glucose, Bld: 103 mg/dL — ABNORMAL HIGH (ref 70–99)
Potassium: 4.8 mEq/L (ref 3.5–5.1)
Sodium: 140 mEq/L (ref 135–145)

## 2018-03-10 LAB — HEPATIC FUNCTION PANEL
ALT: 13 U/L (ref 0–35)
AST: 11 U/L (ref 0–37)
Albumin: 4.5 g/dL (ref 3.5–5.2)
Alkaline Phosphatase: 64 U/L (ref 39–117)
Bilirubin, Direct: 0.1 mg/dL (ref 0.0–0.3)
Total Bilirubin: 0.6 mg/dL (ref 0.2–1.2)
Total Protein: 7.8 g/dL (ref 6.0–8.3)

## 2018-03-10 LAB — CBC WITH DIFFERENTIAL/PLATELET
Basophils Absolute: 0.1 10*3/uL (ref 0.0–0.1)
Basophils Relative: 1 % (ref 0.0–3.0)
Eosinophils Absolute: 0.1 10*3/uL (ref 0.0–0.7)
Eosinophils Relative: 2.4 % (ref 0.0–5.0)
HCT: 38.4 % (ref 36.0–46.0)
Hemoglobin: 13 g/dL (ref 12.0–15.0)
Lymphocytes Relative: 24.6 % (ref 12.0–46.0)
Lymphs Abs: 1.3 10*3/uL (ref 0.7–4.0)
MCHC: 33.8 g/dL (ref 30.0–36.0)
MCV: 91.5 fl (ref 78.0–100.0)
Monocytes Absolute: 0.3 10*3/uL (ref 0.1–1.0)
Monocytes Relative: 5.9 % (ref 3.0–12.0)
Neutro Abs: 3.6 10*3/uL (ref 1.4–7.7)
Neutrophils Relative %: 66.1 % (ref 43.0–77.0)
Platelets: 168 10*3/uL (ref 150.0–400.0)
RBC: 4.2 Mil/uL (ref 3.87–5.11)
RDW: 13.9 % (ref 11.5–15.5)
WBC: 5.4 10*3/uL (ref 4.0–10.5)

## 2018-03-10 LAB — HEMOGLOBIN A1C: Hgb A1c MFr Bld: 6 % (ref 4.6–6.5)

## 2018-03-10 LAB — LIPID PANEL
Cholesterol: 235 mg/dL — ABNORMAL HIGH (ref 0–200)
HDL: 47.3 mg/dL (ref >39.00)
NonHDL: 187.45
Total CHOL/HDL Ratio: 5
Triglycerides: 246 mg/dL — ABNORMAL HIGH (ref 0.0–149.0)
VLDL: 49.2 mg/dL — ABNORMAL HIGH (ref 0.0–40.0)

## 2018-03-10 LAB — TSH: TSH: 1.03 u[IU]/mL (ref 0.35–4.50)

## 2018-03-10 LAB — LDL CHOLESTEROL, DIRECT: Direct LDL: 146 mg/dL

## 2018-03-11 ENCOUNTER — Encounter

## 2018-03-11 ENCOUNTER — Ambulatory Visit (INDEPENDENT_AMBULATORY_CARE_PROVIDER_SITE_OTHER): Payer: PPO | Admitting: Psychology

## 2018-03-11 ENCOUNTER — Encounter: Payer: Self-pay | Admitting: Psychology

## 2018-03-11 DIAGNOSIS — F3130 Bipolar disorder, current episode depressed, mild or moderate severity, unspecified: Secondary | ICD-10-CM

## 2018-03-11 DIAGNOSIS — R413 Other amnesia: Secondary | ICD-10-CM
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
## 2018-03-14 ENCOUNTER — Encounter: Payer: Self-pay | Admitting: Family Medicine

## 2018-03-14 NOTE — Telephone Encounter (Signed)
Dr. Fry please advise. Thanks  

## 2018-03-17 DIAGNOSIS — Z9889 Other specified postprocedural states: Secondary | ICD-10-CM | POA: Diagnosis not present

## 2018-03-17 DIAGNOSIS — Z6832 Body mass index (BMI) 32.0-32.9, adult: Secondary | ICD-10-CM | POA: Diagnosis not present

## 2018-03-17 DIAGNOSIS — N644 Mastodynia: Secondary | ICD-10-CM | POA: Diagnosis not present

## 2018-03-17 DIAGNOSIS — F319 Bipolar disorder, unspecified: Secondary | ICD-10-CM | POA: Diagnosis not present

## 2018-03-17 DIAGNOSIS — G4730 Sleep apnea, unspecified: Secondary | ICD-10-CM | POA: Diagnosis not present

## 2018-03-18 ENCOUNTER — Encounter

## 2018-03-18 ENCOUNTER — Other Ambulatory Visit: Payer: Self-pay | Admitting: General Surgery

## 2018-03-18 DIAGNOSIS — N644 Mastodynia: Secondary | ICD-10-CM

## 2018-03-18 NOTE — Telephone Encounter (Signed)
The first step is for her to come in for me to examine her, then we can decide the next step

## 2018-03-20 ENCOUNTER — Telehealth: Payer: Self-pay | Admitting: Family Medicine

## 2018-03-20 NOTE — Telephone Encounter (Signed)
Copied from CRM [id number]. Topic: General - Other >> Mar 20, 2018  4:04 PM Angela Nevin wrote: Reason for CRM: Pt called stating that she has set up 5 tests with [facility]. Pt just wanted to know if Dr. Clent Ridges thinks this is a good idea. Pt is requesting a call back to discuss, if needed. Please advise.

## 2018-03-21 NOTE — Telephone Encounter (Signed)
Dr. Fry please advise. Thanks  

## 2018-03-21 NOTE — Telephone Encounter (Signed)
I think that would be fine. I will be happy to look over the results

## 2018-03-24 ENCOUNTER — Other Ambulatory Visit: Payer: Self-pay | Admitting: General Surgery

## 2018-03-24 ENCOUNTER — Other Ambulatory Visit: Payer: Self-pay

## 2018-03-24 DIAGNOSIS — N644 Mastodynia: Secondary | ICD-10-CM

## 2018-03-24 NOTE — Telephone Encounter (Signed)
[id number] x1 

## 2018-03-25 ENCOUNTER — Encounter: Payer: Self-pay | Admitting: Psychology

## 2018-03-25 ENCOUNTER — Ambulatory Visit: Payer: PPO | Admitting: Psychology

## 2018-03-25 DIAGNOSIS — R413 Other amnesia: Secondary | ICD-10-CM

## 2018-03-25 NOTE — Progress Notes (Signed)
   Neuropsychology Note  [name] [name] completed 150 minutes of neuropsychological testing with technician, Wallace Keller, BS, under the supervision of Dr. Elvis Coil, Licensed Psychologist. The patient did not appear overtly distressed by the testing session, per behavioral observation or via self-report to the technician. Rest breaks were offered.   Clinical Decision Making: In considering the patient's current level of functioning, level of presumed impairment, nature of symptoms, emotional and behavioral responses during the interview, level of literacy, and observed level of motivation/effort, a battery of tests was selected and communicated to the psychometrician.  Communication between the psychologist and technician was ongoing throughout the testing session and changes were made as deemed necessary based on patient performance on testing, technician observations and additional pertinent factors such as those listed above.  [name] [name] will return within approximately 2 weeks for an interactive feedback session with Dr. Alinda Dooms at which time her test performances, clinical impressions and treatment recommendations will be reviewed in detail. The patient understands she can contact our office should she require our assistance before this time.  35 minutes spent performing neuropsychological evaluation services/clinical decision making (psychologist). [CPT 96132] 150 minutes spent face-to-face with patient administering standardized tests, 60 minutes spent scoring (technician). [CPT [id number], 96139]  Full report to follow.

## 2018-03-26 ENCOUNTER — Ambulatory Visit
Admission: RE | Admit: 2018-03-26 | Discharge: 2018-03-26 | Disposition: A | Discharge status: Home / Self Care | Payer: PPO | Source: Ambulatory Visit | Attending: General Surgery | Admitting: General Surgery

## 2018-03-26 DIAGNOSIS — N644 Mastodynia: Secondary | ICD-10-CM

## 2018-03-26 DIAGNOSIS — R928 Other abnormal and inconclusive findings on diagnostic imaging of breast: Secondary | ICD-10-CM | POA: Diagnosis not present

## 2018-03-26 DIAGNOSIS — N6489 Other specified disorders of breast: Secondary | ICD-10-CM | POA: Diagnosis not present

## 2018-03-28 DIAGNOSIS — G4733 Obstructive sleep apnea (adult) (pediatric): Secondary | ICD-10-CM | POA: Diagnosis not present

## 2018-04-02 ENCOUNTER — Telehealth: Payer: Self-pay | Admitting: Family Medicine

## 2018-04-02 NOTE — Telephone Encounter (Signed)
She would need a PMV for this

## 2018-04-02 NOTE — Telephone Encounter (Signed)
Copied from CRM [id number]. Topic: Quick Communication - See Telephone Encounter >> Apr 02, 2018  2:17 PM Terisa Starr wrote: CRM for notification. See Telephone encounter for: 04/02/18.  HYDROcodone-acetaminophen (NORCO) 10-325 MG tablet  [facility] - [place], [place] - [address] [place] [address]

## 2018-04-02 NOTE — Telephone Encounter (Signed)
Medication not on current medication list. Pt was prescribed this medication previously.

## 2018-04-03 ENCOUNTER — Emergency Department (HOSPITAL_BASED_OUTPATIENT_CLINIC_OR_DEPARTMENT_OTHER): Payer: PPO

## 2018-04-03 ENCOUNTER — Ambulatory Visit: Payer: PPO | Admitting: Psychology

## 2018-04-03 ENCOUNTER — Encounter (HOSPITAL_BASED_OUTPATIENT_CLINIC_OR_DEPARTMENT_OTHER): Payer: Self-pay | Admitting: *Deleted

## 2018-04-03 ENCOUNTER — Other Ambulatory Visit: Payer: Self-pay

## 2018-04-03 ENCOUNTER — Emergency Department (HOSPITAL_BASED_OUTPATIENT_CLINIC_OR_DEPARTMENT_OTHER)
Admission: EM | Admit: 2018-04-03 | Discharge: 2018-04-03 | Disposition: A | Discharge status: Home / Self Care | Payer: PPO | Attending: Emergency Medicine | Admitting: Emergency Medicine

## 2018-04-03 ENCOUNTER — Ambulatory Visit: Payer: Self-pay | Admitting: *Deleted

## 2018-04-03 DIAGNOSIS — W01198A Fall on same level from slipping, tripping and stumbling with subsequent striking against other object, initial encounter: Secondary | ICD-10-CM | POA: Diagnosis not present

## 2018-04-03 DIAGNOSIS — Y999 Unspecified external cause status: Secondary | ICD-10-CM | POA: Insufficient documentation

## 2018-04-03 DIAGNOSIS — Y9301 Activity, walking, marching and hiking: Secondary | ICD-10-CM | POA: Insufficient documentation

## 2018-04-03 DIAGNOSIS — F17210 Nicotine dependence, cigarettes, uncomplicated: Secondary | ICD-10-CM | POA: Diagnosis not present

## 2018-04-03 DIAGNOSIS — R51 Headache: Secondary | ICD-10-CM | POA: Diagnosis not present

## 2018-04-03 DIAGNOSIS — I10 Essential (primary) hypertension: Secondary | ICD-10-CM | POA: Diagnosis not present

## 2018-04-03 DIAGNOSIS — Y92000 Kitchen of unspecified non-institutional (private) residence as  the place of occurrence of the external cause: Secondary | ICD-10-CM | POA: Insufficient documentation

## 2018-04-03 DIAGNOSIS — Z79899 Other long term (current) drug therapy: Secondary | ICD-10-CM | POA: Diagnosis not present

## 2018-04-03 DIAGNOSIS — R42 Dizziness and giddiness: Secondary | ICD-10-CM | POA: Diagnosis not present

## 2018-04-03 HISTORY — DX: Meniere's disease, unspecified ear: H81.09

## 2018-04-03 LAB — BASIC METABOLIC PANEL
Anion gap: 8 (ref 5–15)
BUN: 8 mg/dL (ref 8–23)
CO2: 28 mmol/L (ref 22–32)
Calcium: 9.2 mg/dL (ref 8.9–10.3)
Chloride: 100 mmol/L (ref 98–111)
Creatinine, Ser: 0.56 mg/dL (ref 0.44–1.00)
GFR calc Af Amer: >60 mL/min (ref >60)
GFR calc non Af Amer: >60 mL/min (ref >60)
Glucose, Bld: 115 mg/dL — ABNORMAL HIGH (ref 70–99)
Potassium: 4 mmol/L (ref 3.5–5.1)
Sodium: 136 mmol/L (ref 135–145)

## 2018-04-03 LAB — URINALYSIS, ROUTINE W REFLEX MICROSCOPIC
Bilirubin Urine: NEGATIVE
Glucose, UA: NEGATIVE mg/dL
Hgb urine dipstick: NEGATIVE
Ketones, ur: NEGATIVE mg/dL
Leukocytes, UA: NEGATIVE
Nitrite: NEGATIVE
Protein, ur: NEGATIVE mg/dL
Specific Gravity, Urine: 1.01 (ref 1.005–1.030)
pH: 7.5 (ref 5.0–8.0)

## 2018-04-03 LAB — CBC WITH DIFFERENTIAL/PLATELET
Abs Immature Granulocytes: 0.02 10*3/uL (ref 0.00–0.07)
Basophils Absolute: 0 10*3/uL (ref 0.0–0.1)
Basophils Relative: 1 %
Eosinophils Absolute: 0.2 10*3/uL (ref 0.0–0.5)
Eosinophils Relative: 4 %
HCT: 36.6 % (ref 36.0–46.0)
Hemoglobin: 11.4 g/dL — ABNORMAL LOW (ref 12.0–15.0)
Immature Granulocytes: 0 %
Lymphocytes Relative: 26 %
Lymphs Abs: 1.3 10*3/uL (ref 0.7–4.0)
MCH: 30 pg (ref 26.0–34.0)
MCHC: 31.1 g/dL (ref 30.0–36.0)
MCV: 96.3 fL (ref 80.0–100.0)
Monocytes Absolute: 0.5 10*3/uL (ref 0.1–1.0)
Monocytes Relative: 10 %
Neutro Abs: 3 10*3/uL (ref 1.7–7.7)
Neutrophils Relative %: 59 %
Platelets: 148 10*3/uL — ABNORMAL LOW (ref 150–400)
RBC: 3.8 MIL/uL — ABNORMAL LOW (ref 3.87–5.11)
RDW: 13.2 % (ref 11.5–15.5)
WBC: 5.1 10*3/uL (ref 4.0–10.5)
nRBC: 0 % (ref 0.0–0.2)

## 2018-04-03 LAB — TROPONIN I: Troponin I: <0.03 ng/mL (ref <0.03)

## 2018-04-03 MED ORDER — KETOROLAC TROMETHAMINE 30 MG/ML IJ SOLN
30.0000 mg | Freq: Once | INTRAMUSCULAR | Status: AC
  Administered 2018-04-03: 30 mg via INTRAVENOUS
  Filled 2018-04-03: qty 1

## 2018-04-03 MED ORDER — MECLIZINE HCL 25 MG PO TABS: 25.0000 mg | ORAL_TABLET | Freq: Three times a day (TID) | ORAL | 0 refills | Status: DC | PRN

## 2018-04-03 MED ORDER — MECLIZINE HCL 25 MG PO TABS
25.0000 mg | ORAL_TABLET | Freq: Once | ORAL | Status: AC
  Administered 2018-04-03: 25 mg via ORAL
  Filled 2018-04-03: qty 1

## 2018-04-03 MED ORDER — METOCLOPRAMIDE HCL 5 MG/ML IJ SOLN
10.0000 mg | Freq: Once | INTRAMUSCULAR | Status: AC
  Administered 2018-04-03: 10 mg via INTRAVENOUS
  Filled 2018-04-03: qty 2

## 2018-04-03 NOTE — ED Provider Notes (Signed)
[facility] EMERGENCY DEPARTMENT Provider Note   CSN: [id number] Arrival date & time: 04/03/18  1313     History   Chief Complaint Chief Complaint  Patient presents with  . Dizziness    HPI [name] [name] is a 62 y.o. female.  Patient is a 62 year old female who has a history of Mnire's disease who presents with dizziness.  She states that about a week ago she was swinging her leg over a motorcycle to sit on the motorcycle and lost her balance, falling backward and striking her head on the ground.  Since that time she says her Mnire's disease has been worse and she is felt off balance.  Yesterday when she was walking in the kitchen she fell backwards striking her head on the ground.  She states after that fall she has had worsening dizziness which she describes as a spinning sensation.  She is also had some associated nausea.  She has a frontal headache which is not uncommon for her as she feels like she has chronic sinus disease.  She states normally with her Mnire's disease she feels like she is spinning and has a little bit of being off balance.  She is used to these type symptoms but her dizziness has been worse and she has associated nausea which she does not normally have he states the symptoms all started with a fall she had a week ago but got markedly worse after the fall yesterday.  She denies any chest pain or shortness of breath.  No vomiting or diarrhea.  No other recent illnesses.  She denies any other injuries from the fall.  She did have some blurry vision about 20 minutes after the fall yesterday but no other vision changes.  No numbness or weakness to her extremities.  No numbness or weakness to her face.     Past Medical History:  Diagnosis Date  . Allergy    seasonal  . Bipolar 1 disorder (HCC)   . CPAP (continuous positive airway pressure) dependence   . Depression    sees the [facility]   . GERD (gastroesophageal reflux disease)   .  Hyperlipidemia   . Hypertension   . Memory loss    short term  . Meniere disease   . Obesity (BMI 30-39.9) 11/10/2015  . PPD positive 2007 or 2008  . Sleep apnea    on CPAP therapy  . Urinary incontinence   . Vestibular disorder     Patient Active Problem List   Diagnosis Date Noted  . Neck pain 05/06/2017  . Angina pectoris (HCC) 06/06/2016  . PUD (peptic ulcer disease) 06/06/2016  . Attention deficit hyperactivity disorder (ADHD) 02/07/2016  . Obesity (BMI 30-39.9) 11/10/2015  . Balance disorder 03/02/2015  . Bipolar disorder (HCC) 12/13/2009  . MEMORY LOSS 07/01/2009  . BACK PAIN, LUMBAR 01/21/2009  . EDEMA LEG 01/21/2009  . HIDRADENITIS SUPPURATIVA 10/04/2008  . DIZZINESS 04/16/2008  . 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 Anxiety 01/23/2008  . DEPRESSION 01/23/2008  . Essential hypertension 01/23/2008  . Allergic rhinitis 01/23/2008  . GERD 01/23/2008  . INSOMNIA 01/23/2008  . URINARY INCONTINENCE 01/23/2008  . POSITIVE PPD 01/23/2008  . Hyperlipidemia 04/15/2007  . Sleep apnea 04/15/2007  . Headache 04/15/2007    Past Surgical History:  Procedure Laterality Date  . ABDOMINAL HYSTERECTOMY    . ANTERIOR FUSION CERVICAL SPINE  2006   Dr. Wynetta Emery  . BREAST BIOPSY Right 1981   benign  . BREAST EXCISIONAL BIOPSY Right  1981   benign   scar does not show  . BREAST SURGERY Bilateral 12-11-12   reductions per Dr. Ivin Booty in [facility]   . CATARACT EXTRACTION, BILATERAL Bilateral last summer of 2018   at [facility]  . COLONOSCOPY  04/25/2016   per Dr. Loreta Ave, adenomatous polyps, repeat in 5 yrs   . ESOPHAGOGASTRODUODENOSCOPY  04-23-11   per Dr. Loreta Ave, normal   . LUMBAR DISC SURGERY  2003   Dr. Wynetta Emery  . REDUCTION MAMMAPLASTY Bilateral 2013  . SPLIT NIGHT STUDY  11/11/2015  . UVULOPALATOPHARYNGOPLASTY    . VAGINAL HYSTERECTOMY  12/96   secondary to fibroids.     OB History    Gravida  2   Para  2   Term  2   Preterm      AB      Living  2     SAB       TAB      Ectopic      Multiple      Live Births               Home Medications    Prior to Admission medications   Medication Sig Start Date End Date Taking? Authorizing Provider  acyclovir (ZOVIRAX) 400 MG tablet TAKE 1 TABLET BY MOUTH 4 TIMES A DAY AS NEEDED 09/27/17   Nelwyn Salisbury, MD  alprazolam Prudy Feeler) 2 MG tablet TAKE 1 TABLET BY MOUTH 3 TIMES A DAY AS NEEDED 02/25/18   Nelwyn Salisbury, MD  azelastine (ASTELIN) 0.1 % nasal spray Place 2 sprays into both nostrils 2 (two) times daily. Use in each nostril as directed 03/25/17   [place], Betty G, MD  CALCIUM-MAGNESIUM-ZINC PO Take by mouth daily.    [provider]  Cholecalciferol (VITAMIN D3) 2000 units capsule Take 2,000 Units by mouth daily.    [provider]  CycloSPORINE (RESTASIS OP) Apply to eye daily.    [provider]  FLUoxetine (PROZAC) 10 MG tablet Take 2 tablets (20 mg total) by mouth daily. 03/07/18   Nelwyn Salisbury, MD  fluticasone (FLONASE) 50 MCG/ACT nasal spray Place 1 spray into both nostrils 2 (two) times daily. 03/25/17   [place], Betty G, MD  meclizine (ANTIVERT) 25 MG tablet Take 1 tablet (25 mg total) by mouth 3 (three) times daily as needed for dizziness. 04/03/18   Rolan Bucco, MD  Multiple Vitamin (MULTIVITAMIN) tablet Take 1 tablet by mouth daily.      [provider]  NON FORMULARY     [provider]  omeprazole (PRILOSEC) 20 MG capsule Take 1 capsule (20 mg total) by mouth 2 (two) times daily before a meal. 03/07/18   Nelwyn Salisbury, MD  polyethylene glycol powder (GLYCOLAX/MIRALAX) powder TAKE 17 GRAMS BY MOUTH 2 TIMES DAILY AS NEEDED. 06/05/17   Nelwyn Salisbury, MD  Probiotic Product (PROBIOTIC PO) Take by mouth daily.    [provider]  QUEtiapine (SEROQUEL) 50 MG tablet TAKE 1 TABLET BY MOUTH AT BEDTIME. 01/16/18   Nelwyn Salisbury, MD  Saline (SIMPLY SALINE) 0.9 % AERS Place 1 spray into both nostrils daily as needed.    [provider]    simvastatin (ZOCOR) 80 MG tablet Take 1 tablet (80 mg total) by mouth at bedtime. 11/26/17   Nelwyn Salisbury, MD    Family History Family History  Problem Relation Age of Onset  . Dementia Mother   . Hyperlipidemia Mother   . Asthma  Father   . Hypertension Sister   . Hyperlipidemia Sister   . Coronary artery disease Sister   . Thyroid disease Sister   . Hypertension Brother   . Hyperlipidemia Brother   . Coronary artery disease Brother   . Hypertension Sister   . Hyperlipidemia Sister   . Alcohol abuse Unknown        fhx  . Arthritis Unknown        fhx  . Coronary artery disease Unknown        fhx  . Depression Unknown        fhx  . Diabetes Unknown        fhx  . Hyperlipidemia Unknown        fhx  . Hypertension Unknown        fhx  . Sudden death Unknown        fhx    Social History Social History   Tobacco Use  . Smoking status: Current Every Day Smoker    Packs/day: 0.25    Years: 30.00    Pack years: 7.50    Types: Cigarettes  . Smokeless tobacco: Never Used  . Tobacco comment: stopped smoking; CBD vapor  Substance Use Topics  . Alcohol use: No    Alcohol/week: 0.0 standard drinks  . Drug use: No     Allergies   Sulfonamide derivatives; Flexeril [cyclobenzaprine]; Morphine and related; Sulfa antibiotics; and Sulfamethoxazole   Review of Systems Review of Systems  Constitutional: Positive for fatigue. Negative for chills, diaphoresis and fever.  HENT: Negative for congestion, rhinorrhea and sneezing.   Eyes: Negative.   Respiratory: Negative for cough, chest tightness and shortness of breath.   Cardiovascular: Negative for chest pain and leg swelling.  Gastrointestinal: Positive for nausea. Negative for abdominal pain, blood in stool, diarrhea and vomiting.  Genitourinary: Negative for difficulty urinating, flank pain, frequency and hematuria.  Musculoskeletal: Negative for arthralgias and back pain.  Skin: Negative for rash.  Neurological:  Positive for dizziness and headaches. Negative for speech difficulty, weakness and numbness.     Physical Exam Updated Vital Signs BP 140/80   Pulse 67   Temp 97.8 F (36.6 C) (Oral)   Resp 20   Ht 5\' 3"  (1.6 m)   Wt 81.6 kg   SpO2 98%   BMI 31.89 kg/m   Physical Exam  Constitutional: She is oriented to person, place, and time. She appears well-developed and well-nourished.  HENT:  Head: Normocephalic and atraumatic.  Eyes: Pupils are equal, round, and reactive to light.  No visible nystagmus  Neck: Normal range of motion. Neck supple.  No pain to the cervical thoracic or lumbosacral spine  Cardiovascular: Normal rate, regular rhythm and normal heart sounds.  Pulmonary/Chest: Effort normal and breath sounds normal. No respiratory distress. She has no wheezes. She has no rales. She exhibits no tenderness.  Abdominal: Soft. Bowel sounds are normal. There is no tenderness. There is no rebound and no guarding.  Musculoskeletal: Normal range of motion. She exhibits no edema.  Lymphadenopathy:    She has no cervical adenopathy.  Neurological: She is alert and oriented to person, place, and time.  Motor 5/5 all extremities Sensation grossly intact to LT all extremities Finger to Nose intact, no pronator drift CN II-XII grossly intact    Skin: Skin is warm and dry. No rash noted.  Psychiatric: She has a normal mood and affect.     ED Treatments / Results  Labs (all labs ordered are listed,  but only abnormal results are displayed) Labs Reviewed  BASIC METABOLIC PANEL - Abnormal; Notable for the following components:      Result Value   Glucose, Bld 115 (*)    All other components within normal limits  CBC WITH DIFFERENTIAL/PLATELET - Abnormal; Notable for the following components:   RBC 3.80 (*)    Hemoglobin 11.4 (*)    Platelets 148 (*)    All other components within normal limits  TROPONIN I  URINALYSIS, ROUTINE W REFLEX MICROSCOPIC    EKG EKG  Interpretation  Date/Time:  Thursday April 03 2018 16:56:28 EDT Ventricular Rate:  63 PR Interval:  160 QRS Duration: 86 QT Interval:  430 QTC Calculation: 440 R Axis:   80 Text Interpretation:  Normal sinus rhythm Normal ECG since last tracing no significant change Confirmed by Rolan Bucco [id number]) on 04/03/2018 6:57:17 PM   Radiology Ct Head Wo Contrast  Result Date: 04/03/2018 CLINICAL DATA:  Dizziness.  Headache. EXAM: CT HEAD WITHOUT CONTRAST TECHNIQUE: Contiguous axial images were obtained from the base of the skull through the vertex without intravenous contrast. COMPARISON:  Brain MRI 04/19/2013 FINDINGS: Brain: No evidence of acute infarction, hemorrhage, hydrocephalus, extra-axial collection or mass lesion/mass effect. Vascular: Calcific atherosclerotic disease of the intra cavernous carotid arteries. Skull: Normal. Negative for fracture or focal lesion. Sinuses/Orbits: Mild opacification of the ethmoid sinuses. Other: None. IMPRESSION: No acute intracranial abnormality. Electronically Signed   By: Ted Mcalpine M.D.   On: 04/03/2018 19:48    Procedures Procedures (including critical care time)  Medications Ordered in ED Medications  meclizine (ANTIVERT) tablet 25 mg (25 mg Oral Given 04/03/18 2004)  metoCLOPramide (REGLAN) injection 10 mg (10 mg Intravenous Given 04/03/18 2004)  ketorolac (TORADOL) 30 MG/ML injection 30 mg (30 mg Intravenous Given 04/03/18 2007)     Initial Impression / Assessment and Plan / ED Course  I have reviewed the triage vital signs and the nursing notes.  Pertinent labs & imaging results that were available during my care of the patient were reviewed by me and considered in my medical decision making (see chart for details).     Patient is a 62 year old female who presents with dizziness.  She does have a history of Mnire's disease and normally that presents with her feeling off balance.  Her symptoms are worse after recent fall.   She has had some associated spinning sensation and nausea.  She has a bifrontal headache which is similar to her prior headaches.  She has no other injuries from the fall.  She is neurologically intact.  She has no nystagmus or other concerns for central etiology for her vertigo symptoms.  Given her recent fall, a CT scan was performed which shows no evidence of intracranial hemorrhage or other acute abnormality.  She was given meclizine and treatment for her headache in the ED.  She is feeling much better and is able to ambulate without ataxia.  She has no chest pain shortness of breath or anginal symptoms.  Her labs are non-concerning.  There is no evidence of UTI.  She was discharged home in good condition.  She has a follow-up appointment with her PCP tomorrow.  Final Clinical Impressions(s) / ED Diagnoses   Final diagnoses:  Dizziness    ED Discharge Orders         Ordered    meclizine (ANTIVERT) 25 MG tablet  3 times daily PRN     04/03/18 2115  Rolan Bucco, MD 04/03/18 2117

## 2018-04-03 NOTE — ED Notes (Signed)
Pt. Reports she has history of vertigo and meniere's  Disease.  Pt. Reports the two hit her tonight and she is dealing with both of them tonight.

## 2018-04-03 NOTE — ED Notes (Signed)
Patient ambulated in the hallway with no complaints of dizziness.  Spouse indicated that patient's gate was her baseline.

## 2018-04-03 NOTE — Telephone Encounter (Signed)
Patient began to experience dizziness with vertigo yesterday. Stated she had much difficultly walking yesterday and fell several times. Today, she continues to feel woozy-lightheaded but no vertigo and is able to walk with a staff. She has been diagnosed with Meniere's disease in the past. Denies fever/ear pain/vertigo today. When asked about facial features and weakness: Her tongue veers to the right, her left eye droops slightly. She stated her facial features are from Bell's Palsy she has had in the past. Her speech is slightly slurred-she responded she is wearing new dentures and not used to them and her husband did not mention that her speech was any different, yesterday. Stated her vision was slightly blurred but she felt this was her normal.  Her arms are steady. Denies one-sided weakness/numbness-she feels weak all over and has felt fatigued for several months. Advised she be taken to Urgent Care or ER for evaluation. Stated she would have her husband drive her. Appointment made with PCP for tomorrow. Routing to PCP for further advice. Reason for Disposition . [1] MODERATE dizziness (e.g., vertigo; feels very unsteady, interferes with normal activities) AND [2] has NOT been evaluated by physician for this  Answer Assessment - Initial Assessment Questions 1. DESCRIPTION: "Describe your dizziness."     Unsteady on her feet.  2. VERTIGO: "Do you feel like either you or the room is spinning or tilting?"     Vertigo yesterday  3. LIGHTHEADED: "Do you feel lightheaded?" (e.g., somewhat faint, woozy, weak upon standing)     No vertigo today just woozy and unsteady on feet. 4. SEVERITY: "How bad is it?"  "Can you walk?"   - MILD - Feels unsteady but walking normally.   - MODERATE - Feels very unsteady when walking, but not falling; interferes with normal activities (e.g., school, work) .   - SEVERE - Unable to walk without falling (requires assistance).     moderate. 5. ONSET:  "When did the  dizziness begin?"     yesterday 6. AGGRAVATING FACTORS: "Does anything make it worse?" (e.g., standing, change in head position)     Constant woozy today 7. CAUSE: "What do you think is causing the dizziness?"     Meniere's disease 8. RECURRENT SYMPTOM: "Have you had dizziness before?" If so, ask: "When was the last time?" "What happened that time?"     Has been diagnosed with Meniere's 9. OTHER SYMPTOMS: "Do you have any other symptoms?" (e.g., headache, weakness, numbness, vomiting, earache)     None of these 10. PREGNANCY: "Is there any chance you are pregnant?" "When was your last menstrual period?"       no  Protocols used: DIZZINESS - VERTIGO-A-AH

## 2018-04-03 NOTE — ED Notes (Signed)
Pt. Said she is ready to go home

## 2018-04-03 NOTE — ED Triage Notes (Signed)
Dizziness since yesterday. Hx of meniere's disease. Headache. Back pain.

## 2018-04-04 ENCOUNTER — Encounter: Payer: Self-pay | Admitting: Family Medicine

## 2018-04-04 ENCOUNTER — Ambulatory Visit (INDEPENDENT_AMBULATORY_CARE_PROVIDER_SITE_OTHER): Payer: PPO | Admitting: Family Medicine

## 2018-04-04 VITALS — BP 122/72 | HR 67 | Temp 97.6°F | Wt 184.6 lb

## 2018-04-04 DIAGNOSIS — R42 Dizziness and giddiness: Secondary | ICD-10-CM

## 2018-04-04 DIAGNOSIS — I10 Essential (primary) hypertension: Secondary | ICD-10-CM

## 2018-04-04 MED ORDER — SCOPOLAMINE 1 MG/3DAYS TD PT72: 1.0000 | MEDICATED_PATCH | TRANSDERMAL | 5 refills | Status: DC

## 2018-04-04 NOTE — Progress Notes (Signed)
   Subjective:    Patient ID: [name] [name], female    DOB: 03-Jul-1955, 62 y.o.   MRN: [id number]  HPI Here to follow up an ER visit yesterday for extreme dizziness. The day before that she had fallen at home and struck her head. No LOC. At the ER she had a head CT which was unremarkable. Her labs looked good as well. It was felt that her chronic Menieres disease had flared up. She was given IV fluids and Meclizine. Today she feels better but still has some constand dizziness. No nausea or headache.    Review of Systems  Constitutional: Negative.   Respiratory: Negative.   Neurological: Positive for dizziness. Negative for tremors, seizures, syncope, facial asymmetry, speech difficulty, weakness, light-headedness, numbness and headaches.       Objective:   Physical Exam  Constitutional: She is oriented to person, place, and time. She appears well-developed and well-nourished.  HENT:  Head: Normocephalic and atraumatic.  Eyes: Pupils are equal, round, and reactive to light. Conjunctivae and EOM are normal.  Neck: No thyromegaly present.  Cardiovascular: Normal rate, regular rhythm, normal heart sounds and intact distal pulses.  Pulmonary/Chest: Effort normal and breath sounds normal.  Lymphadenopathy:    She has no cervical adenopathy.  Neurological: She is oriented to person, place, and time. She displays normal reflexes. No cranial nerve deficit or sensory deficit. She exhibits normal muscle tone.  Slightly unsteady on her feet, but she can walk unassisted           Assessment & Plan:  Chronic dizziness, consistent with Menieres. Try Scopolamine transdermal patches every 3 days. Recheck next week. Gershon Crane, MD

## 2018-04-07 ENCOUNTER — Encounter: Payer: Self-pay | Admitting: Family Medicine

## 2018-04-07 ENCOUNTER — Ambulatory Visit (INDEPENDENT_AMBULATORY_CARE_PROVIDER_SITE_OTHER): Payer: PPO | Admitting: Family Medicine

## 2018-04-07 VITALS — BP 144/80 | HR 69 | Temp 98.1°F | Wt 181.1 lb

## 2018-04-07 DIAGNOSIS — M5440 Lumbago with sciatica, unspecified side: Secondary | ICD-10-CM | POA: Diagnosis not present

## 2018-04-07 DIAGNOSIS — F1190 Opioid use, unspecified, uncomplicated: Secondary | ICD-10-CM

## 2018-04-07 DIAGNOSIS — G8929 Other chronic pain: Secondary | ICD-10-CM

## 2018-04-07 MED ORDER — HYDROCODONE-ACETAMINOPHEN 10-325 MG PO TABS: 1.0000 | ORAL_TABLET | Freq: Four times a day (QID) | ORAL | 0 refills | Status: DC | PRN

## 2018-04-07 MED ORDER — HYDROCODONE-ACETAMINOPHEN 10-325 MG PO TABS: 1.0000 | ORAL_TABLET | Freq: Four times a day (QID) | ORAL | 0 refills | Status: AC | PRN

## 2018-04-07 NOTE — Progress Notes (Signed)
   Subjective:    Patient ID: [name] [name], female    DOB: 11/28/1955, 62 y.o.   MRN: [id number]  HPI Here for pain management. She is doing well.  Indication for chronic opioid: low back pain Medication and dose: Norco 10-325  # pills per month: 120 Last UDS date: 06-05-17 Opioid Treatment Agreement signed (Y/N): 09-17-17 Opioid Treatment Agreement last reviewed with patient:  04-07-18 [facility] reviewed this encounter (include red flags):  04-07-18    Review of Systems  Constitutional: Negative.   Respiratory: Negative.   Cardiovascular: Negative.   Musculoskeletal: Positive for back pain.       Objective:   Physical Exam  Constitutional: She is oriented to person, place, and time. She appears well-developed and well-nourished.  Cardiovascular: Normal rate, regular rhythm, normal heart sounds and intact distal pulses.  Pulmonary/Chest: Effort normal and breath sounds normal.  Neurological: She is alert and oriented to person, place, and time.          Assessment & Plan:  Pain management, meds were refilled.  Gershon Crane, MD
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
## 2018-04-08 ENCOUNTER — Encounter: Payer: Self-pay | Admitting: Psychology

## 2018-04-08 ENCOUNTER — Ambulatory Visit (INDEPENDENT_AMBULATORY_CARE_PROVIDER_SITE_OTHER): Payer: PPO | Admitting: Psychology

## 2018-04-08 DIAGNOSIS — R413 Other amnesia: Secondary | ICD-10-CM | POA: Diagnosis not present

## 2018-04-08 DIAGNOSIS — F3130 Bipolar disorder, current episode depressed, mild or moderate severity, unspecified: Secondary | ICD-10-CM

## 2018-04-17 ENCOUNTER — Encounter: Payer: Self-pay | Admitting: Psychology

## 2018-04-17 ENCOUNTER — Encounter

## 2018-05-08 ENCOUNTER — Other Ambulatory Visit: Payer: Self-pay | Admitting: Family Medicine

## 2018-05-08 MED ORDER — MECLIZINE HCL 25 MG PO TABS: 25.0000 mg | ORAL_TABLET | Freq: Three times a day (TID) | ORAL | 4 refills | Status: DC | PRN

## 2018-06-06 DIAGNOSIS — G4733 Obstructive sleep apnea (adult) (pediatric): Secondary | ICD-10-CM | POA: Diagnosis not present

## 2018-06-29 ENCOUNTER — Emergency Department (HOSPITAL_BASED_OUTPATIENT_CLINIC_OR_DEPARTMENT_OTHER): Payer: PPO

## 2018-06-29 ENCOUNTER — Emergency Department (HOSPITAL_BASED_OUTPATIENT_CLINIC_OR_DEPARTMENT_OTHER)
Admission: EM | Admit: 2018-06-29 | Discharge: 2018-06-29 | Disposition: A | Discharge status: Home / Self Care | Payer: PPO | Attending: Emergency Medicine | Admitting: Emergency Medicine

## 2018-06-29 ENCOUNTER — Encounter (HOSPITAL_BASED_OUTPATIENT_CLINIC_OR_DEPARTMENT_OTHER): Payer: Self-pay | Admitting: Emergency Medicine

## 2018-06-29 ENCOUNTER — Other Ambulatory Visit: Payer: Self-pay

## 2018-06-29 DIAGNOSIS — I10 Essential (primary) hypertension: Secondary | ICD-10-CM | POA: Insufficient documentation

## 2018-06-29 DIAGNOSIS — Z79899 Other long term (current) drug therapy: Secondary | ICD-10-CM | POA: Insufficient documentation

## 2018-06-29 DIAGNOSIS — S199XXA Unspecified injury of neck, initial encounter: Secondary | ICD-10-CM | POA: Diagnosis not present

## 2018-06-29 DIAGNOSIS — Y92008 Other place in unspecified non-institutional (private) residence as the place of occurrence of the external cause: Secondary | ICD-10-CM | POA: Diagnosis not present

## 2018-06-29 DIAGNOSIS — S0990XA Unspecified injury of head, initial encounter: Secondary | ICD-10-CM | POA: Insufficient documentation

## 2018-06-29 DIAGNOSIS — Y999 Unspecified external cause status: Secondary | ICD-10-CM | POA: Diagnosis not present

## 2018-06-29 DIAGNOSIS — W102XXA Fall (on)(from) incline, initial encounter: Secondary | ICD-10-CM | POA: Insufficient documentation

## 2018-06-29 DIAGNOSIS — Y9301 Activity, walking, marching and hiking: Secondary | ICD-10-CM | POA: Insufficient documentation

## 2018-06-29 DIAGNOSIS — F17210 Nicotine dependence, cigarettes, uncomplicated: Secondary | ICD-10-CM | POA: Diagnosis not present

## 2018-06-29 DIAGNOSIS — S069X9A Unspecified intracranial injury with loss of consciousness of unspecified duration, initial encounter: Secondary | ICD-10-CM | POA: Diagnosis not present

## 2018-06-29 DIAGNOSIS — M542 Cervicalgia: Secondary | ICD-10-CM | POA: Diagnosis not present

## 2018-06-29 LAB — CBG MONITORING, ED: Glucose-Capillary: 99 mg/dL (ref 70–99)

## 2018-06-29 NOTE — ED Notes (Signed)
ED Provider at bedside. 

## 2018-06-29 NOTE — ED Notes (Signed)
Patient transported to X-ray 

## 2018-06-29 NOTE — Discharge Instructions (Addendum)
Use your cane or walker at all times to prevent falls.  Keep your scheduled appointment with Dr.Fry tomorrow.  Go over all of your medications with Dr. Clent Ridges.  You may also need to schedule an appointment with your neurologist.  Please speak with Dr. Clent Ridges about that.  Please tell Dr. Clent Ridges that you were seen here today and that you had a CT scan and x-rays performed here

## 2018-06-29 NOTE — ED Triage Notes (Addendum)
Pt fell off of her porch two weeks ago and hit her head. Unknown LOC. Her husband states she has been steadily declining since then. He states she is lethargic and sleeps all of the time. Pt is CAO x 4. Endorses headache, slight slurred speech noted. He states she could not feed herself last night and couldn't keep her eyes open during dinner.

## 2018-06-29 NOTE — ED Provider Notes (Addendum)
[facility] EMERGENCY DEPARTMENT Provider Note   CSN: [id number] Arrival date & time: 06/29/18  1337   History is given from patient and from patient's ex-husband who lives with her  History   Chief Complaint Chief Complaint  Patient presents with  . Head Injury    HPI [name] [name] is a 63 y.o. female.  Fell from a height of 3 feet 3 weeks ago striking her head.  Since the fall she has been more sleepy.  She sometimes suffers from balance when walking typical of Mnire's disease or vertigo that she is experienced in the past.  She also complains of headache at the top of her head and neck feels "stiff" since the fall.  She treats herself with occasional hydrocodone, naproxen and Goody powder for her headache with relief.  She also occasionally treats herself with Xanax for anxiety or sleep.  Her ex-husband states that she fell asleep during dinner last night, and that she has been slow to answer questions since the fall.  And No other associated symptoms nothing makes symptoms better or worse.  HPI  Past Medical History:  Diagnosis Date  . Allergy    seasonal  . Bipolar 1 disorder (HCC)   . CPAP (continuous positive airway pressure) dependence   . Depression    [facility]   . GERD (gastroesophageal reflux disease)   . Hyperlipidemia   . Hypertension   . Memory loss    short term  . Meniere disease   . Obesity (BMI 30-39.9) 11/10/2015  . PPD positive 2007 or 2008  . Sleep apnea    on CPAP therapy  . Urinary incontinence   . Vestibular disorder     Patient Active Problem List   Diagnosis Date Noted  . Neck pain 05/06/2017  . Angina pectoris (HCC) 06/06/2016  . PUD (peptic ulcer disease) 06/06/2016  . Attention deficit hyperactivity disorder (ADHD) 02/07/2016  . Obesity (BMI 30-39.9) 11/10/2015  . Balance disorder 03/02/2015  . Bipolar disorder (HCC) 12/13/2009  . MEMORY LOSS 07/01/2009  . BACK PAIN, LUMBAR 01/21/2009  . EDEMA LEG  01/21/2009  . HIDRADENITIS SUPPURATIVA 10/04/2008  . DIZZINESS 04/16/2008  . 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 Anxiety 01/23/2008  . DEPRESSION 01/23/2008  . Essential hypertension 01/23/2008  . Allergic rhinitis 01/23/2008  . GERD 01/23/2008  . INSOMNIA 01/23/2008  . URINARY INCONTINENCE 01/23/2008  . POSITIVE PPD 01/23/2008  . Hyperlipidemia 04/15/2007  . Sleep apnea 04/15/2007  . Headache 04/15/2007    Past Surgical History:  Procedure Laterality Date  . ABDOMINAL HYSTERECTOMY    . ANTERIOR FUSION CERVICAL SPINE  2006   Dr. Wynetta Emeryram  . BREAST BIOPSY Right 1981   benign  . BREAST EXCISIONAL BIOPSY Right 1981   benign   scar does not show  . BREAST SURGERY Bilateral 12-11-12   reductions [facility] Ivin [facility]gil [facility]   . CATARACT EXTRACTION, BILATERAL B[facility] last summer of 2018   [facility]  . COLONOSCOPY  04/25/2016   per Dr. Loreta AveMann, adenomatous polyps, repeat in 5 yrs   . ESOPHAGOGASTRODUODENOSCOPY  04-23-11   per Dr. Loreta AveMann, normal   . LUMBAR DISC SURGERY  2003   Dr. Wynetta Emeryram  . REDUCTION MAMMAPLASTY Bilateral 2013  . SPLIT NIGHT STUDY  11/11/2015  . UVULOPALATOPHARYNGOPLASTY    . VAGINAL HYSTERECTOMY  12/96   secondary to fibroids.     OB History    Gravida  2   Para  2  Term  2   Preterm      AB      Living  2     SAB      TAB      Ectopic      Multiple      Live Births               Home Medications    Prior to Admission medications   Medication Sig Start Date End Date Taking? Authorizing Provider  acyclovir (ZOVIRAX) 400 MG tablet TAKE 1 TABLET BY MOUTH 4 TIMES A DAY AS NEEDED 09/27/17   Nelwyn SalisburyFry, Stephen A, MD  alprazolam Prudy Feeler(XANAX) 2 MG tablet TAKE 1 TABLET BY MOUTH 3 TIMES A DAY AS NEEDED 02/25/18   Nelwyn SalisburyFry, Stephen A, MD  azelastine (ASTELIN) 0.1 % nasal spray Place 2 sprays into both nostrils 2 (two) times daily. Use in each nostril as directed 03/25/17   [place], Betty G, MD  CALCIUM-MAGNESIUM-ZINC PO Take by mouth daily.    [provider]  Cholecalciferol (VITAMIN D3) 2000 units capsule Take 2,000 Units by mouth daily.    [provider]  CycloSPORINE (RESTASIS OP) Apply to eye daily.    [provider]  FLUoxetine (PROZAC) 10 MG tablet Take 2 tablets (20 mg total) by mouth daily. 03/07/18   Nelwyn SalisburyFry, Stephen A, MD  fluticasone (FLONASE) 50 MCG/ACT nasal spray Place 1 spray into both nostrils 2 (two) times daily. 03/25/17   [place], Betty G, MD  HYDROcodone-acetaminophen [facility](NORCO) 10-325 MG tablet Take 1 tablet by mouth every 6 (six) hours as needed for moderate pain. 06/07/18 07/07/18  Nelwyn SalisburyFry, Stephen A, MD  meclizine (ANTIVERT) 25 MG tablet Take 1 tablet (25 mg total) by mouth 3 (three) times daily as needed for dizziness. 05/08/18   Nelwyn SalisburyFry, Stephen A, MD  Multiple Vitamin (MULTIVITAMIN) tablet Take 1 tablet by mouth daily.      [provider]  NON FORMULARY     [provider]  omeprazole (PRILOSEC) 20 MG capsule Take 1 capsule (20 mg total) by mouth 2 (two) times daily before a meal. 03/07/18   Nelwyn SalisburyFry, Stephen A, MD  polyethylene glycol powder (GLYCOLAX/MIRALAX) powder TAKE 17 GRAMS BY MOUTH 2 TIMES DAILY AS NEEDED. 06/05/17   Nelwyn SalisburyFry, Stephen A, MD  Probiotic Product (PROBIOTIC PO) Take by mouth daily.    [provider]  QUEtiapine (SEROQUEL) 50 MG tablet TAKE 1 TABLET BY MOUTH AT BEDTIME. 01/16/18   Nelwyn SalisburyFry, Stephen A, MD  Saline (SIMPLY SALINE) 0.9 % AERS Place 1 spray into both nostrils daily as needed.    [provider]  scopolamine (TRANSDERM-SCOP, 1.5 MG,) 1 MG/3DAYS Place 1 patch (1.5 mg total) onto the skin every 3 (three) days. 04/04/18   Nelwyn SalisburyFry, Stephen A, MD  simvastatin (ZOCOR) 80 MG tablet Take 1 tablet (80 mg total) by mouth at bedtime. 11/26/17   Nelwyn SalisburyFry, Stephen A, MD    Family History Family History  Problem Relation Age of Onset  . Dementia Mother   . Hyperlipidemia Mother   . Asthma Father   . Hypertension Sister   . Hyperlipidemia Sister   . Coronary artery disease  Sister   . Thyroid disease Sister   . Hypertension Brother   . Hyperlipidemia Brother   . Coronary artery disease Brother   . Hypertension Sister   . Hyperlipidemia Sister   . Alcohol abuse Other        fhx  . Arthritis Other  fhx  . Coronary artery disease Other        fhx  . Depression Other        fhx  . Diabetes Other        fhx  . Hyperlipidemia Other        fhx  . Hypertension Other        fhx  . Sudden death Other        fhx    Social History Social History   Tobacco Use  . Smoking status: Current Every Day Smoker    Packs/day: 0.25    Years: 30.00    Pack years: 7.50    Types: Cigarettes  . Smokeless tobacco: Never Used  . Tobacco comment: stopped smoking; CBD vapor  Substance Use Topics  . Alcohol use: No    Alcohol/week: 0.0 standard drinks  . Drug use: No     Allergies   Sulfonamide derivatives; Flexeril [cyclobenzaprine]; Morphine and related; Sulfa antibiotics; and Sulfamethoxazole   Review of Systems Review of Systems  Musculoskeletal: Positive for gait problem and neck stiffness.       Walks with cane or walker  Neurological: Positive for headaches.  All other systems reviewed and are negative.    Physical Exam Updated Vital Signs BP (!) 141/64 (BP Location: Left Arm)   Pulse 71   Temp 98.7 F (37.1 C) (Oral)   Resp 16   Ht 5\' 2"  (1.575 m)   Wt 80.3 kg   SpO2 97%   BMI 32.37 kg/m   Physical Exam Vitals signs and nursing note reviewed.  Constitutional:      Appearance: She is well-developed.  HENT:     Head: Normocephalic and atraumatic.     Comments: No facial asymmetry    Right Ear: Tympanic membrane, ear canal and external ear normal.     Left Ear: Tympanic membrane, ear canal and external ear normal.  Eyes:     Conjunctiva/sclera: Conjunctivae normal.     Pupils: Pupils are equal, round, and reactive to light.  Neck:     Musculoskeletal: Neck supple.     Thyroid: No thyromegaly.     Trachea: No tracheal  deviation.  Cardiovascular:     Rate and Rhythm: Normal rate and regular rhythm.     Heart sounds: No murmur.  Pulmonary:     Effort: Pulmonary effort is normal.     Breath sounds: Normal breath sounds.  Abdominal:     General: Bowel sounds are normal. There is no distension.     Palpations: Abdomen is soft.     Tenderness: There is no abdominal tenderness.     Comments: Obese  Musculoskeletal: Normal range of motion.        General: No tenderness.     Comments: Spine nontender.  Pelvis stable nontender.  All 4 extremities well contusion abrasion or tenderness neurovascular intact  Skin:    General: Skin is warm and dry.     Findings: No rash.  Neurological:     Mental Status: She is alert and oriented to person, place, and time.     Coordination: Coordination normal.     Comments: Gait normal, walks unassisted.  Finger-to-nose normal cranial nerves II through XII grossly intact.  DTRs symmetric bilaterally at knee jerk ankle jerk biceps toes downgoing bilaterally      ED Treatments / Results  Labs (all labs ordered are listed, but only abnormal results are displayed) Labs Reviewed - No data to display  EKG  None  Radiology Ct Head Wo Contrast  Result Date: 06/29/2018 CLINICAL DATA:  Larey Seat off a deck. Head injury with loss of consciousness. EXAM: CT HEAD WITHOUT CONTRAST TECHNIQUE: Contiguous axial images were obtained from the base of the skull through the vertex without intravenous contrast. COMPARISON:  04/03/2018 FINDINGS: Brain: There is no evidence for acute hemorrhage, hydrocephalus, mass lesion, or abnormal extra-axial fluid collection. No definite CT evidence for acute infarction. Vascular: No hyperdense vessel or unexpected calcification. Skull: No evidence for fracture. No worrisome lytic or sclerotic lesion. Sinuses/Orbits: The visualized paranasal sinuses and mastoid air cells are clear. Visualized portions of the globes and intraorbital fat are unremarkable. Other:  None. IMPRESSION: No acute intracranial abnormality. Electronically Signed   By: Kennith Center M.D.   On: 06/29/2018 15:12    Procedures Procedures (including critical care time)  Medications Ordered in ED Medications - No data to display Results for orders placed or performed during the hospital encounter of 06/29/18  POC CBG, ED  Result Value Ref Range   Glucose-Capillary 99 70 - 99 mg/dL   Dg Cervical Spine Complete  Result Date: 06/29/2018 CLINICAL DATA:  Larey Seat offered deck 2 weeks ago and hit her head. Lethargy. EXAM: CERVICAL SPINE - COMPLETE 4+ VIEW COMPARISON:  C-spine CT 08/09/2017 FINDINGS: Straightening of normal cervical lordosis noted. Patient is status post anterior discectomy and interbody fusion at C5-6. Intervertebral disc spaces are relatively well preserved throughout. Facet hypertrophy noted bilaterally resulting in foraminal encroachment on each side at C3-4 and on the right at C4-5. No evidence for fracture. No prevertebral soft tissue swelling. IMPRESSION: 1. No evidence for cervical spine fracture. 2. Anterior fusion C5-6. 3. Loss of cervical lordosis. This can be related to patient positioning, muscle spasm or soft tissue injury. Electronically Signed   By: Kennith Center M.D.   On: 06/29/2018 16:42   Ct Head Wo Contrast  Result Date: 06/29/2018 CLINICAL DATA:  Larey Seat off a deck. Head injury with loss of consciousness. EXAM: CT HEAD WITHOUT CONTRAST TECHNIQUE: Contiguous axial images were obtained from the base of the skull through the vertex without intravenous contrast. COMPARISON:  04/03/2018 FINDINGS: Brain: There is no evidence for acute hemorrhage, hydrocephalus, mass lesion, or abnormal extra-axial fluid collection. No definite CT evidence for acute infarction. Vascular: No hyperdense vessel or unexpected calcification. Skull: No evidence for fracture. No worrisome lytic or sclerotic lesion. Sinuses/Orbits: The visualized paranasal sinuses and mastoid air cells are  clear. Visualized portions of the globes and intraorbital fat are unremarkable. Other: None. IMPRESSION: No acute intracranial abnormality. Electronically Signed   By: Kennith Center M.D.   On: 06/29/2018 15:12   X-rays viewed by me  Capillary blood sugar 99, normal Initial Impression / Assessment and Plan / ED Course  I have reviewed the triage vital signs and the nursing notes.  Pertinent labs & imaging results that were available during my care of the patient were reviewed by me and considered in my medical decision making (see chart for details).     Suspect the patient's "lethargy or somnolence is due to postconcussive syndrome versus overmedication.  She is to keep her scheduled appoint with Dr. Clent Ridges tomorrow.  [facility]  Final Clinical Impressions(s) / ED Diagnoses  Diagnosis #1 fall #2 Minor head injury Final diagnoses:  None    ED Discharge Orders    None       Doug Sou, MD 06/29/18 1655    Anastasija Anfinson,  Raylan Hanton, MD 06/29/18 8938

## 2018-06-29 NOTE — ED Notes (Signed)
When EMT went to get a CBG, Patient was getting dressed saying " Im ready to go, I dont want to be here anymore. I feel better. "

## 2018-06-30 ENCOUNTER — Telehealth: Payer: Self-pay | Admitting: Family Medicine

## 2018-06-30 ENCOUNTER — Encounter: Payer: Self-pay | Admitting: Family Medicine

## 2018-06-30 ENCOUNTER — Telehealth: Payer: Self-pay | Admitting: Cardiology

## 2018-06-30 ENCOUNTER — Ambulatory Visit (INDEPENDENT_AMBULATORY_CARE_PROVIDER_SITE_OTHER): Payer: PPO | Admitting: Family Medicine

## 2018-06-30 VITALS — BP 142/100 | HR 74 | Temp 97.5°F | Wt 180.5 lb

## 2018-06-30 DIAGNOSIS — R42 Dizziness and giddiness: Secondary | ICD-10-CM

## 2018-06-30 DIAGNOSIS — R0781 Pleurodynia: Secondary | ICD-10-CM

## 2018-06-30 DIAGNOSIS — R300 Dysuria: Secondary | ICD-10-CM | POA: Diagnosis not present

## 2018-06-30 LAB — POC URINALSYSI DIPSTICK (AUTOMATED)
Bilirubin, UA: NEGATIVE
Blood, UA: NEGATIVE
Glucose, UA: NEGATIVE
Ketones, UA: NEGATIVE
Leukocytes, UA: NEGATIVE
Nitrite, UA: NEGATIVE
Protein, UA: NEGATIVE
Spec Grav, UA: 1.01 (ref 1.010–1.025)
Urobilinogen, UA: 0.2 E.U./dL
pH, UA: 7.5 (ref 5.0–8.0)

## 2018-06-30 NOTE — Telephone Encounter (Signed)
Spoke with the patient, she has had chronic SOB, that started around November 2018. She stated her SOB has worsened. She is SOB on exertion, fatigued and has significant trouble sleeping. She is diagnosed with COPD.  The patient did not have any other symptoms. Download printed from [employer] to be uploaded for review.

## 2018-06-30 NOTE — Progress Notes (Signed)
   Subjective:    Patient ID: [name] GibbonsShelia Kay [name], female    DOB: 07/07/1955, 63 y.o.   MRN: [id number]  HPI Here to follow up an ER visit yesterday for symptoms related to a fall 3 weeks ago. On that day she lost her balance on a back porch and fell about 3 feet to the ground. She presented with dizziness and confusion and some chest discomfort. At the ER her exam and labs looked good. A head CT and cervical spine Xrays were unremarkable. She was told to follow up with us. Since then the dizziness has improved a bit. No headache or nausea or vision changes today. She realized the Meclizine was not helping her dizziness, but was in fact causing sedation so she stopped taking this as of yesterday. She wonders if she has a UTI because her urine has had a foul odor and she has some urgency. No burning or fever.    Review of Systems  Constitutional: Negative.   Eyes: Negative.   Respiratory: Positive for shortness of breath.   Cardiovascular: Positive for chest pain.  Gastrointestinal: Negative.   Genitourinary: Positive for frequency and urgency.  Neurological: Positive for dizziness. Negative for headaches.       Objective:   Physical Exam Constitutional:      General: She is not in acute distress.    Appearance: She is well-developed. She is not ill-appearing.  Neck:     Musculoskeletal: Normal range of motion.  Cardiovascular:     Rate and Rhythm: Normal rate and regular rhythm.     Pulses: Normal pulses.     Heart sounds: Normal heart sounds.  Pulmonary:     Effort: Pulmonary effort is normal.     Breath sounds: Normal breath sounds.     Comments: Mildly tender across the sternum  Abdominal:     General: Abdomen is flat. Bowel sounds are normal. There is no distension.     Palpations: Abdomen is soft. There is no mass.     Tenderness: There is no abdominal tenderness. There is no guarding or rebound.     Hernia: No hernia is present.  Neurological:     General: No focal  deficit present.     Mental Status: She is alert and oriented to person, place, and time.           Assessment & Plan:  She fell and has some residual rib soreness. This will resolve in the next few weeks. For the dizziness, she will avoid Meclizine. We will get her in to see Neurology ASAP (she has not seen Dr. Everlena CooperJaffe for over 2 years). She is due to see Dr. Mayford Knifeurner in Cardiology next week. We will check a UA today for the urinary complaints. Gershon CraneStephen Genia Perin, MD

## 2018-06-30 NOTE — Telephone Encounter (Signed)
New Message         Pt c/o Shortness Of Breath: STAT if SOB developed within the last 24 hours or pt is noticeably SOB on the phone  1. Are you currently SOB (can you hear that pt is SOB on the phone)? Yes  2. How long have you been experiencing SOB? A couple of weeks  3. Are you SOB when sitting or when up moving around? All time  4. Are you currently experiencing any other symptoms? Patient has a concussion.

## 2018-06-30 NOTE — Telephone Encounter (Signed)
Copied from CRM [id number]. Topic: Quick Communication - See Telephone Encounter >> Jun 30, 2018  3:21 PM Jens SomMedley, Jennifer A wrote: CRM for notification. See Telephone encounter for: 06/30/18.  Patient's Daughter is calling regarding referral to Dr. Everlena CooperJaffe.  Patient is unable to get in until May.  Calling to get a referral to another Dr. Please advise. [phone]

## 2018-06-30 NOTE — Telephone Encounter (Signed)
Dr. Clent Ridges please advise if we can refer to another neurologist.  They do not want to wait until May.

## 2018-06-30 NOTE — Telephone Encounter (Signed)
Patient needs to see her PCP ASAP

## 2018-07-01 ENCOUNTER — Ambulatory Visit: Payer: PPO | Admitting: Neurology

## 2018-07-01 ENCOUNTER — Encounter: Payer: Self-pay | Admitting: Neurology

## 2018-07-01 VITALS — BP 112/78 | HR 62 | Ht 62.0 in | Wt 180.0 lb

## 2018-07-01 DIAGNOSIS — G3281 Cerebellar ataxia in diseases classified elsewhere: Secondary | ICD-10-CM | POA: Diagnosis not present

## 2018-07-01 DIAGNOSIS — F0781 Postconcussional syndrome: Secondary | ICD-10-CM | POA: Diagnosis not present

## 2018-07-01 DIAGNOSIS — E538 Deficiency of other specified B group vitamins: Secondary | ICD-10-CM | POA: Diagnosis not present

## 2018-07-01 HISTORY — DX: Postconcussional syndrome: F07.81

## 2018-07-01 NOTE — Telephone Encounter (Signed)
I have called and lmom to make the pt aware of the new referral that was placed

## 2018-07-01 NOTE — Telephone Encounter (Signed)
[facility]

## 2018-07-01 NOTE — Telephone Encounter (Signed)
PEC made patient aware that Neurology referral placed and that someone will reach out to schedule their appt. Nothing further needed.

## 2018-07-01 NOTE — Telephone Encounter (Signed)
Spoke with the patient's ex-husband, per dpr, he stated that she went to PCP yesterday and they listened to her lungs and said they were clear. They did not address any of her SOB. The ex-husband wanted to know what was the next step. He also stated that she wanted a new mask instead of the pillow mask for CPAP. She also requested a change in CPAP settings because she is not sleeping well. Sending to Dr. Mayford Knife.

## 2018-07-01 NOTE — Progress Notes (Signed)
Reason for visit: Gait disorder, dizziness  Referring physician: Dr. Dory Horn [name] is a 63 y.o. female  History of present illness:  [name] [name] is a 63 year old right-handed white female with a history of bipolar disorder, diabetes, and chronic dizziness and gait instability.  The patient has also reported a long standing history of memory problems.  The patient has been seen through several neurologists in the past, she was followed through [facility] since 2010 for dizziness and memory problems.  The patient underwent vestibular rehabilitation with good improvement.  The patient has more recently been seen by Dr. Everlena Cooper in 2017, she was set up for neuropsychological evaluation, no sign of dementia was noted at that time.  The patient has been on alprazolam on a chronic basis, she on average takes 90 of the 2 mg alprazolam tablets a month.  She is also on hydrocodone and takes the 10 mg tablets on average 3 times daily.  The patient also is on Seroquel 50 mg at night for sleep and she takes Prozac 10 mg daily.  The patient walks with a cane, she had a recent fall approximately 3 weeks ago and struck her head resulting in a concussion.  She has had daily headaches since that time.  She has had increasing problems with memory and concentration.  She has reported occasional episodes of true vertigo.  She also has dizziness that occurs when she is out in public and gets overstimulated and has what sounds like an anxiety attack.  The patient will slur her speech at times, since the concussion, she has had increased drowsiness, she is sleeping a lot.  She was tried on meclizine but could not tolerate this.  She reports no new numbness or of the extremities, she feels weak all over.  She does have some nausea associated with a headache and dizziness.  She was told at some point that she had Mnire's disease, but has not had any hearing changes with this.  She comes at this office for an  evaluation.  Of the last MRI of the brain that was done was in 2014.  Past Medical History:  Diagnosis Date  . Allergy    seasonal  . Bipolar 1 disorder (HCC)   . CPAP (continuous positive airway pressure) dependence   . Depression    sees the [facility]   . GERD (gastroesophageal reflux disease)   . Hyperlipidemia   . Hypertension   . Memory loss    short term  . Meniere disease   . Obesity (BMI 30-39.9) 11/10/2015  . PPD positive 2007 or 2008  . Sleep apnea    on CPAP therapy  . Urinary incontinence   . Vestibular disorder     Past Surgical History:  Procedure Laterality Date  . ABDOMINAL HYSTERECTOMY    . ANTERIOR FUSION CERVICAL SPINE  2006   Dr. Wynetta Emery  . BREAST BIOPSY Right 1981   benign  . BREAST EXCISIONAL BIOPSY Right 1981   benign   scar does not show  . BREAST SURGERY Bilateral 12-11-12   reductions per Dr. Ivin Booty in [facility]   . CATARACT EXTRACTION, BILATERAL Bilateral last summer of 2018   at [facility]  . COLONOSCOPY  04/25/2016   per Dr. Loreta Ave, adenomatous polyps, repeat in 5 yrs   . ESOPHAGOGASTRODUODENOSCOPY  04-23-11   per Dr. Loreta Ave, normal   . LUMBAR DISC SURGERY  2003   Dr. Wynetta Emery  . REDUCTION MAMMAPLASTY Bilateral  2013  . SPLIT NIGHT STUDY  11/11/2015  . UVULOPALATOPHARYNGOPLASTY    . VAGINAL HYSTERECTOMY  12/96   secondary to fibroids.    Family History  Problem Relation Age of Onset  . Dementia Mother   . Hyperlipidemia Mother   . Asthma Father   . Hypertension Sister   . Hyperlipidemia Sister   . Coronary artery disease Sister   . Thyroid disease Sister   . Hypertension Brother   . Hyperlipidemia Brother   . Coronary artery disease Brother   . Hypertension Sister   . Hyperlipidemia Sister   . Alcohol abuse Other        fhx  . Arthritis Other        fhx  . Coronary artery disease Other        fhx  . Depression Other        fhx  . Diabetes Other        fhx  . Hyperlipidemia Other        fhx  . Hypertension Other         fhx  . Sudden death Other        fhx    Social history:  reports that she has been smoking cigarettes. She has a 7.50 pack-year smoking history. She has never used smokeless tobacco. She reports that she does not drink alcohol or use drugs.  Medications:  Prior to Admission medications   Medication Sig Start Date End Date Taking? Authorizing Provider  acyclovir (ZOVIRAX) 400 MG tablet TAKE 1 TABLET BY MOUTH 4 TIMES A DAY AS NEEDED 09/27/17  Yes Nelwyn Salisbury, MD  alprazolam Prudy Feeler) 2 MG tablet TAKE 1 TABLET BY MOUTH 3 TIMES A DAY AS NEEDED 02/25/18  Yes Nelwyn Salisbury, MD  azelastine (ASTELIN) 0.1 % nasal spray Place 2 sprays into both nostrils 2 (two) times daily. Use in each nostril as directed 03/25/17  Yes [place], Betty G, MD  CALCIUM-MAGNESIUM-ZINC PO Take by mouth daily.   Yes [provider]  Cholecalciferol (VITAMIN D3) 2000 units capsule Take 2,000 Units by mouth daily.   Yes [provider]  CycloSPORINE (RESTASIS OP) Apply to eye daily.   Yes [provider]  FLUoxetine (PROZAC) 10 MG tablet Take 2 tablets (20 mg total) by mouth daily. 03/07/18  Yes Nelwyn Salisbury, MD  fluticasone (FLONASE) 50 MCG/ACT nasal spray Place 1 spray into both nostrils 2 (two) times daily. 03/25/17  Yes [place], Betty G, MD  HYDROcodone-acetaminophen [facility]) 10-325 MG tablet Take 1 tablet by mouth every 6 (six) hours as needed for moderate pain. 06/07/18 07/07/18 Yes Nelwyn Salisbury, MD  Multiple Vitamin (MULTIVITAMIN) tablet Take 1 tablet by mouth daily.     Yes [provider]  NON FORMULARY    Yes [provider]  omeprazole (PRILOSEC) 20 MG capsule Take 1 capsule (20 mg total) by mouth 2 (two) times daily before a meal. 03/07/18  Yes Nelwyn Salisbury, MD  polyethylene glycol powder (GLYCOLAX/MIRALAX) powder TAKE 17 GRAMS BY MOUTH 2 TIMES DAILY AS NEEDED. 06/05/17  Yes Nelwyn Salisbury, MD  Probiotic Product (PROBIOTIC PO) Take by mouth daily.   Yes [provider]  QUEtiapine (SEROQUEL) 50 MG tablet TAKE 1 TABLET BY MOUTH AT BEDTIME. 01/16/18  Yes Nelwyn Salisbury, MD  Saline (SIMPLY SALINE) 0.9 % AERS Place 1 spray into both nostrils daily as needed.   Yes [provider]  scopolamine (TRANSDERM-SCOP, 1.5 MG,) 1 MG/3DAYS Place 1  patch (1.5 mg total) onto the skin every 3 (three) days. 04/04/18  Yes Nelwyn Salisbury, MD  simvastatin (ZOCOR) 80 MG tablet Take 1 tablet (80 mg total) by mouth at bedtime. 11/26/17  Yes Nelwyn Salisbury, MD      Allergies  Allergen Reactions  . Sulfonamide Derivatives Other (See Comments)    Facial swelling  . Flexeril [Cyclobenzaprine] Other (See Comments)    Dizziness  . Morphine And Related Nausea And Vomiting  . Sulfa Antibiotics Other (See Comments)  . Sulfamethoxazole Other (See Comments)    Causes swelling     ROS:  Out of a complete 14 system review of symptoms, the patient complains only of the following symptoms, and all other reviewed systems are negative.  Dizziness Walking problems Depression, anxiety Generalized weakness  Blood pressure 112/78, pulse 62, height 5\' 2"  (1.575 m), weight 180 lb (81.6 kg).  Physical Exam  General: The patient is alert and cooperative at the time of the examination.  Eyes: Pupils are equal, round, and reactive to light. Discs are flat bilaterally.  Neck: The neck is supple, no carotid bruits are noted.  Respiratory: The respiratory examination is clear.  Cardiovascular: The cardiovascular examination reveals a regular rate and rhythm, no obvious murmurs or rubs are noted.  Skin: Extremities are without significant edema.  Neurologic Exam  Mental status: The patient is alert and oriented x 3 at the time of the examination. The patient has apparent normal recent and remote memory, with an apparently normal attention span and concentration ability.  Cranial nerves: Facial symmetry is not present.  The intra-palpebral fissure is somewhat lower on  the left.  There is good sensation of the face to pinprick and soft touch bilaterally. The strength of the facial muscles and the muscles to head turning and shoulder shrug are normal bilaterally. Speech is slightly dysarthric, not a phasic.[name] Extraocular movements are full. Visual fields are full. The tongue is midline, and the patient has symmetric elevation of the soft palate. No obvious hearing deficits are noted.  Motor: The motor testing reveals 5 over 5 strength of all 4 extremities. Good symmetric motor tone is noted throughout.  Sensory: Sensory testing is intact to pinprick, soft touch, vibration sensation, and position sense on all 4 extremities, with exception some decreased position sense on the right foot. No evidence of extinction is noted.  Coordination: Cerebellar testing reveals good finger-nose-finger and heel-to-shin bilaterally.  Some evidence of an intention tremor seen with finger-nose-finger bilaterally.  Gait and station: Gait is wide-based, unsteady.  The patient usually uses a cane for ambulation.  Tandem gait is unsteady.  Romberg is negative but is unsteady.  Reflexes: Deep tendon reflexes are symmetric and normal bilaterally. Toes are downgoing bilaterally.   Assessment/Plan:  1.  Chronic dizziness, vertigo  2.  Chronic gait instability  3.  Chronic reports of memory disturbance  4.  Bipolar disorder, anxiety disorder  5.  Recent head injury, postconcussive syndrome  In the past, the patient has benefited from vestibular rehabilitation for her dizziness.  I am concerned that the patient is overmedicated, I would recommend reducing the dose of alprazolam, the patient appears to be slightly dysarthric and unstable with her ability to ambulate.  I have recommended that the alprazolam be reduced to 1 mg 3 times daily and eventually reduce the dose from there.  The patient will undergo MRI of the brain, blood work will be done today.  Once that the patient has  recovered some  from the postconcussive syndrome we may consider vestibular rehabilitation.  She will follow-up in 3 to 4 months.  [name] Palau. Keith Willis MD 07/01/2018 3:35 PM  [facility] [address] [place] [id number]  Phone [phone] Fax [phone]

## 2018-07-01 NOTE — Telephone Encounter (Signed)
Left a detailed message to call back.  

## 2018-07-01 NOTE — Patient Instructions (Signed)
Reduce the alprazolam dose to 1 mg (1/2 tablet) three times a day.

## 2018-07-02 ENCOUNTER — Telehealth: Payer: Self-pay | Admitting: Neurology

## 2018-07-02 NOTE — Telephone Encounter (Signed)
I spoke to the patient she is aware.  °

## 2018-07-02 NOTE — Telephone Encounter (Signed)
Health team order sent to GI. No auth they will reach out to the pt to schedule.  °

## 2018-07-03 ENCOUNTER — Telehealth: Payer: Self-pay | Admitting: *Deleted

## 2018-07-03 ENCOUNTER — Encounter: Payer: Self-pay | Admitting: Family Medicine

## 2018-07-03 LAB — VITAMIN B12: Vitamin B-12: 939 pg/mL (ref 232–1245)

## 2018-07-03 LAB — RPR: RPR Ser Ql: NONREACTIVE

## 2018-07-03 LAB — COPPER, SERUM: Copper: 114 ug/dL (ref 72–166)

## 2018-07-03 NOTE — Telephone Encounter (Signed)
It was negative.

## 2018-07-03 NOTE — Telephone Encounter (Signed)
Copied from CRM [id number]. Topic: General - Other >> Jul 03, 2018  9:13 AM [name] wrote: Reason for CRM:   Patient would like a call back with the results of her recent urine specimen.

## 2018-07-03 NOTE — Telephone Encounter (Signed)
Please find out if she wants a full face mask or nasal mask.  Please get a download from her PAP device.

## 2018-07-03 NOTE — Telephone Encounter (Signed)
Pt also sent in an email and the results have been sent to the pt via mychart message.

## 2018-07-03 NOTE — Telephone Encounter (Signed)
Dr. Fry please advise. Thanks  

## 2018-07-04 NOTE — Telephone Encounter (Signed)
Dr. Clent Ridges ---any further recs for her and the foul smelling urine?  Thanks

## 2018-07-07 ENCOUNTER — Telehealth: Payer: Self-pay | Admitting: *Deleted

## 2018-07-07 NOTE — Telephone Encounter (Signed)
The only other thing I can think of is this can be a side effect of certain vitamins. Have her stop taking any vitamins for a few weeks to see if that helps

## 2018-07-07 NOTE — Telephone Encounter (Signed)
-----   Message from [name], MD sent at 06/30/2018 10:02 PM EST ----- AHI remains elevated - is she using the chin strap and is she sleeping supine or on her side

## 2018-07-07 NOTE — Telephone Encounter (Signed)
Informed patient of compliance results and patient understanding was verbalized. Patient is aware and agreeable to AHI being out of  range at 31.8. Patient yes, she is using he cpap and no she does not sleep o her back. Patient is aware and agreeable to improving compliance with machine usage.

## 2018-07-07 NOTE — Addendum Note (Signed)
Addended by: Reesa Chew on: 07/07/2018 04:58 PM   Modules accepted: Level of Service, SmartSet

## 2018-07-07 NOTE — Telephone Encounter (Signed)
Informed patient of compliance results and patient understanding was verbalized. Patient is aware and agreeable to AHI being out of range range at 31.8. Patient is aware and agreeable to being in compliance with machine usage. Patient is aware and agreeable to no change in current pressures.

## 2018-07-07 NOTE — Telephone Encounter (Signed)
This encounter was created in error - please disregard.

## 2018-07-07 NOTE — Telephone Encounter (Signed)
-----   Message from Traci R Turner, MD sent at 06/30/2018 10:02 PM EST ----- AHI remains elevated - is she using the chin strap and is she sleeping supine or on her side 

## 2018-07-09 NOTE — Progress Notes (Addendum)
Subjective:   [name] [name] is a 63 y.o. female who presents for Medicare Annual (Subsequent) preventive examination.  Review of Systems:  No ROS.  Medicare Wellness Visit. Additional risk factors are reflected in the social history.  Cardiac Risk Factors include: hypertension;advanced age (>8men, >56 women);dyslipidemia;sedentary lifestyle Sleep patterns: has difficulty falling asleep and has interrupted sleep. At end of visit, admitted to having incontinence throughout the day, which disturbs her sleep, as well as having difficulty with her sleep apnea machine. Ex-husband stated he is working on getting her a mask instead of nasal apparatus.   Home Safety/Smoke Alarms: Feels safe in home. Smoke alarms in place.  Living environment; residence and Firearm Safety: 1-story house/ trailer, number of outside stairs: 3. Larey Seat off porch railing recently, seen in ED. Will be getting MRI soon as ordered by neurologist per ex-husband. Fall prevention strategies discussed. Ex-husband agreed to assist and ask for help from family in assisting removing walking barriers in pt's bedroom. Seat Belt Safety/Bike Helmet: Wears seat belt.   Female:   Pap- 06/2016, due 06/2019       Mammo- 03/2018, due 03/2020       Dexa scan-12/2008, normal T score.          CCS- 04/2016, due 04/2026     Objective:     Vitals: BP 112/66 (BP Location: Right Arm, Patient Position: Sitting, Cuff Size: Normal)   Pulse 88   Temp 97.6 F (36.4 C)   Resp 16   Ht 5\' 2"  (1.575 m)   Wt 187 lb (84.8 kg)   SpO2 94%   BMI 34.20 kg/m   Body mass index is 34.2 kg/m.  Advanced Directives 07/10/2018 06/29/2018 04/03/2018 07/09/2017 06/06/2016 06/06/2016 05/28/2016  Does Patient Have a Medical Advance Directive? (No Data) No No Yes Yes No Yes  Type of Advance Directive - - - - Hospital doctor of [place];Living will  Does patient want to make changes to medical advance directive? - - - - No -  Patient declined - -  Copy of Healthcare Power of Attorney in Chart? - - - - Yes - -  Would patient like information on creating a medical advance directive? - - - - - - -    Tobacco Social History   Tobacco Use  Smoking Status Light Tobacco Smoker  . Years: 30.00  . Types: Cigarettes  Smokeless Tobacco Never Used  Tobacco Comment   occasional smoker. used CBD vapor but no longer does     Ready to quit: No Counseling given: Yes Comment: occasional smoker. used CBD vapor but no longer does   Past Medical History:  Diagnosis Date  . Allergy    seasonal  . Bipolar 1 disorder (HCC)   . CPAP (continuous positive airway pressure) dependence   . Depression    se[facility] the [facility]   . GERD (gastroesophageal reflux disease)   . Hyperlipidemia   . Hypertension   . Memory loss    short term  . Meniere disease   . Obesity (BMI 30-39.9) 11/10/2015  . Post concussion syndrome 07/01/2018  . PPD positive 2007 or 2008  . Sleep apnea    on CPAP therapy  . Urinary incontinence   . Vestibular disorder    Past Surgical History:  Procedure Laterality Date  . ABDOMINAL HYSTERECTOMY    . ANTERIOR FUSION CERVICAL SPINE  2006   Dr. Wynetta Emery  . BREAST BIOPSY Right 1981   benign  .  BREAST EXCISIONAL BIOPSY Right 1981   benign   scar does not show  . BREAST SURGERY Bilateral 12-11-12   [facility]gil [facility]   . CATARACT EXTRACTION, BILATERAL [facility] summer of 2018   [facility]  . COLONOSCOPY  04/25/2016   per Dr. Loreta AveMann, adenomatous polyps, repeat in 5 yrs   . ESOPHAGOGASTRODUODENOSCOPY  04-23-11   per Dr. Loreta AveMann, normal   . EYE SURGERY  06/18/2016   L eyelid   . LUMBAR DISC SURGERY  2003   Dr. Wynetta Emeryram  . REDUCTION MAMMAPLASTY Bilateral 2013  . SPLIT NIGHT STUDY  11/11/2015  . UVULOPALATOPHARYNGOPLASTY    . VAGINAL HYSTERECTOMY  12/96   secondary to fibroids.   Family History  Problem Relation Age of Onset  . Dementia Mother   . Hyperlipidemia  Mother   . Asthma Father   . Hypertension Sister   . Hyperlipidemia Sister   . Coronary artery disease Sister   . Thyroid disease Sister   . Hypertension Brother   . Hyperlipidemia Brother   . Coronary artery disease Brother   . Hypertension Sister   . Hyperlipidemia Sister   . Alcohol abuse Other        fhx  . Arthritis Other        fhx  . Coronary artery disease Other        fhx  . Depression Other        fhx  . Diabetes Other        fhx  . Hyperlipidemia Other        fhx  . Hypertension Other        fhx  . Sudden death Other        fhx   Social History   Socioeconomic History  . Marital status: Divorced    Spouse name: Not on file  . Number of children: 2  . Years of education: Not on file  . Highest education level: Associate degree: academic program  Occupational History  . Occupation: Disabled  Social Needs  . Financial resource strain: Not very hard  . Food insecurity:    Worry: Never true    Inability: Never true  . Transportation needs:    Medical: No    Non-medical: No  Tobacco Use  . Smoking status: Light Tobacco Smoker    Years: 30.00    Types: Cigarettes  . Smokeless tobacco: Never Used  . Tobacco comment: occasional smoker. used CBD vapor but no longer does  Substance and Sexual Activity  . Alcohol use: No    Alcohol/week: 0.0 standard drinks  . Drug use: No  . Sexual activity: Not Currently    Partners: Male    Birth control/protection: Surgical  Lifestyle  . Physical activity:    Days per week: 0 days    Minutes per session: 0 min  . Stress: To some extent  Relationships  . Social connections:    Talks on phone: Three times a week    Gets together: Once a week    Attends religious service: 1 to 4 times per year    Active member of club or organization: Yes    Attends meetings of clubs or organizations: 1 to 4 times per year    Relationship status: Divorced  Other Topics Concern  . Not on file  Social History Narrative   Pt  lives in [place], [place]   Right handed    Occasional use of caffeine  Lives with ex-husband         07/10/2018: Lives with ex-husband on one-level house. Divorced, according to husband, for financial benefit only    Has two children, one local and one in [place]. Has several grandchildren locally who can help her occasionally if needed.    Outpatient Encounter Medications as of 07/10/2018  Medication Sig  . acyclovir (ZOVIRAX) 400 MG tablet TAKE 1 TABLET BY MOUTH 4 TIMES A DAY AS NEEDED  . alprazolam (XANAX) 2 MG tablet TAKE 1 TABLET BY MOUTH 3 TIMES A DAY AS NEEDED  . azelastine (ASTELIN) 0.1 % nasal spray Place 2 sprays into both nostrils 2 (two) times daily. Use in each nostril as directed  . b complex vitamins tablet Take 1 tablet by mouth daily.  [name]. CALCIUM-MAGNESIUM-ZINC PO Take by mouth daily.  . Cholecalciferol (VITAMIN D3) 2000 units capsule Take 2,000 Units by mouth daily.  . CycloSPORINE (RESTASIS OP) Apply to eye daily.  [name]. FLUoxetine (PROZAC) 10 MG tablet Take 2 tablets (20 mg total) by mouth daily.  . fluticasone (FLONASE) 50 MCG/ACT nasal spray Place 1 spray into both nostrils 2 (two) times daily.  . NON FORMULARY   . omeprazole (PRILOSEC) 20 MG capsule Take 1 capsule (20 mg total) by mouth 2 (two) times daily before a meal.  . polyethylene glycol powder (GLYCOLAX/MIRALAX) powder TAKE 17 GRAMS BY MOUTH 2 TIMES DAILY AS NEEDED.  [name]. Probiotic Product (PROBIOTIC PO) Take by mouth daily.  . QUEtiapine (SEROQUEL) 50 MG tablet TAKE 1 TABLET BY MOUTH AT BEDTIME.  . Saline (SIMPLY SALINE) 0.9 % AERS Place 1 spray into both nostrils daily as needed.  . simvastatin (ZOCOR) 80 MG tablet Take 1 tablet (80 mg total) by mouth at bedtime.  . [DISCONTINUED] Multiple Vitamin (MULTIVITAMIN) tablet Take 1 tablet by mouth daily.    . [DISCONTINUED] scopolamine (TRANSDERM-SCOP, 1.5 MG,) 1 MG/3DAYS Place 1 patch (1.5 mg total) onto the skin every 3 (three) days. (Patient not taking: Reported on 07/10/2018)     No facility-administered encounter medications on file as of 07/10/2018.     Activities of Daily Living In your present state of health, do you have any difficulty performing the following activities: 07/10/2018  Hearing? N  Vision? N  Difficulty concentrating or making decisions? Y  Walking or climbing stairs? Y  Dressing or bathing? N  Comment uses tub bench for bathing  Doing errands, shopping? Y  Comment difficulty driving d/t dizziness. Ex-husband runs errands normally  Preparing Food and eating ? N  Comment ex-husband manages  Using the Toilet? Y  In the past six months, have you accidently leaked urine? Y  Comment advised to follow-up with GYN and PCP  Do you have problems with loss of bowel control? N  Managing your Medications? N  Managing your Finances? N  Comment pt. states she still manages  Housekeeping or managing your Housekeeping? N  Comment ex-husband assists  Some recent data might be hidden    Patient Care Team: Nelwyn SalisburyFry, Stephen A, MD as PCP - General Quintella Reicherturner, Traci R, MD as Consulting Physician (Cardiology)    Assessment:   This is a routine wellness examination for [name] [name]. Physical assessment deferred to PCP.   Exercise Activities and Dietary recommendations Current Exercise Habits: Home exercise routine(uses glider for exercise. Cautioned use d/t vertigo, recent falls. Advocated use of stretching/calisthenics while seated), Exercise limited by: psychological condition(s);neurologic condition(s) Diet (meal preparation, eat out, water intake, caffeinated beverages, dairy products, fruits and vegetables): in general,  a "healthy" diet  . Discussed avoiding foods that trigger GERD, as that was a patient concern. :      Goals    . Exercise 150 min/wk Moderate Activity     Will try to go to silver sneakers     . Patient Stated     To be at true peace with myself, read bible more, go to church services more frequently. To be free from falls!         Fall  Risk Fall Risk  07/10/2018 07/09/2017 03/30/2016  Falls in the past year? 1 Yes Yes  Number falls in past yr: 1 2 or more 2 or more  Injury with Fall? - No -  Risk for fall due to : History of fall(s);Impaired vision;Impaired balance/gait;Medication side effect;Impaired mobility;Mental status change - -  Follow up Falls evaluation completed;Education provided;Falls prevention discussed Education provided -  Comment - states she has learned how to fall; often gets dizzy -    Depression Screen PHQ 2/9 Scores 07/10/2018 07/09/2017  PHQ - 2 Score 2 2  PHQ- 9 Score 19 7     Cognitive Function MMSE - Mini Mental State Exam 03/30/2016  Orientation to time 5  Orientation to Place 4  Registration 3  Attention/ Calculation 4  Recall 2  Language- name 2 objects 2  Language- repeat 1  Language- follow 3 step command 3  Language- read & follow direction 1  Write a sentence 1  Copy design 1  Total score 27       Pt. With poor sleeping habits, post-concussive syndrome symptoms. Did not perform MMSE due to time constraints and patient's inabiilty to focus on one topic.  Immunization History  Administered Date(s) Administered  . Influenza Split 04/26/2011  . Influenza Whole 03/23/2010  . Influenza,inj,Quad PF,6+ Mos 03/20/2013, 03/02/2015, 03/26/2016, 03/25/2017  . Influenza-Unspecified 05/26/2014  . Pneumococcal Conjugate-13 04/19/2016  . Pneumococcal Polysaccharide-23 03/07/2018  . Tdap 04/20/2013  . Zoster 03/26/2016    Screening Tests Health Maintenance  Topic Date Due  . PAP SMEAR-Modifier  07/10/2019  . MAMMOGRAM  03/26/2020  . TETANUS/TDAP  04/21/2023  . COLONOSCOPY  04/25/2026  . INFLUENZA VACCINE  Completed  . Hepatitis C Screening  Completed  . HIV Screening  Completed        Plan:    Take it easy as you recover from concussion. Rest when tired, and while awake, make the most of your day by doing things that add value to your life!   Fall prevention discussed, see  tips.  Consider grab bars for bathroom and getting life alert for if you do have another fall.   Follow-up with GYN for concern with incontinence.   Will speak to Dr. Clent Ridges about urine drug screen, getting hydrocodone refilled for back pain control.  Follow up with behavioral health, referral placed.  Support group for meniere's disease, caregiver support group information, and medical alert system information to be mailed out to patient's mailing address.   I have personally reviewed and noted the following in the patient's chart:   . Medical and social history . Use of alcohol, tobacco or illicit drugs  . Current medications and supplements . Functional ability and status . Nutritional status . Physical activity . Advanced directives . List of other physicians . Vitals . Screenings to include cognitive, depression, and falls . Referrals and appointments  In addition, I have reviewed and discussed with patient certain preventive protocols, quality metrics, and best practice recommendations. A  written personalized care plan for preventive services as well as general preventive health recommendations were provided to patient.     Brayton Layman, RN  07/10/2018   I have read this note and agree with its contents.  Gershon Crane, MD

## 2018-07-10 ENCOUNTER — Ambulatory Visit (INDEPENDENT_AMBULATORY_CARE_PROVIDER_SITE_OTHER): Payer: PPO

## 2018-07-10 ENCOUNTER — Telehealth: Payer: Self-pay

## 2018-07-10 ENCOUNTER — Telehealth: Payer: Self-pay | Admitting: Family Medicine

## 2018-07-10 VITALS — BP 112/66 | HR 88 | Temp 97.6°F | Resp 16 | Ht 62.0 in | Wt 187.0 lb

## 2018-07-10 DIAGNOSIS — F3132 Bipolar disorder, current episode depressed, moderate: Secondary | ICD-10-CM | POA: Diagnosis not present

## 2018-07-10 DIAGNOSIS — Z0000 Encounter for general adult medical examination without abnormal findings: Secondary | ICD-10-CM

## 2018-07-10 DIAGNOSIS — M5440 Lumbago with sciatica, unspecified side: Secondary | ICD-10-CM

## 2018-07-10 DIAGNOSIS — G8929 Other chronic pain: Secondary | ICD-10-CM | POA: Diagnosis not present

## 2018-07-10 NOTE — Telephone Encounter (Signed)
Copied from CRM [id number]. Topic: Quick Communication - See Telephone Encounter >> Jul 10, 2018  3:56 PM Lorrine Kin, NT wrote: CRM for notification. See Telephone encounter for: 07/10/18. Patient calling and states that she had her pain management appointment today and forgot to get the prescriptions sent to the pharmacy. Please advise. Patient states that she is needing her HYDROcodone-acetaminophen (NORCO) 10-325 MG tablet prescription sent to the pharmacy. [facility] - [place] - [address]

## 2018-07-10 NOTE — Telephone Encounter (Signed)
PHQ-9 score today during AWV=19, pt. not suicidal. Referral to behavioral health made. Pt. Left urine sample for UDS today, but no active order for hydrocodone found upon medication review. Routed to PCP to review.

## 2018-07-10 NOTE — Patient Instructions (Addendum)
Follow up with behavioral health, referral placed.  Take it easy as you recover from concussion. Rest when tired, and while awake, make the most of your day by doing things that add value to your life!   Fall prevention discussed, see tips.  Consider grab bars for bathroom and getting life alert for if you do have another fall.   Follow-up with GYN for concern with incontinence.   Will speak to Dr. Clent Ridges about urine drug screen, getting hydrocodone refilled for back pain control.  Fall Prevention in the Home, Adult Falls can cause injuries and can affect people from all age groups. There are many simple things that you can do to make your home safe and to help prevent falls. Ask for help when making these changes, if needed. What actions can I take to prevent falls? General instructions  Use good lighting in all rooms. Replace any light bulbs that burn out.  Turn on lights if it is dark. Use night-lights.  Place frequently used items in easy-to-reach places. Lower the shelves around your home if necessary.  Set up furniture so that there are clear paths around it. Avoid moving your furniture around.  Remove throw rugs and other tripping hazards from the floor.  Avoid walking on wet floors.  Fix any uneven floor surfaces.  Add color or contrast paint or tape to grab bars and handrails in your home. Place contrasting color strips on the first and last steps of stairways.  When you use a stepladder, make sure that it is completely opened and that the sides are firmly locked. Have someone hold the ladder while you are using it. Do not climb a closed stepladder.  Be aware of any and all pets. What can I do in the bathroom?      Keep the floor dry. Immediately clean up any water that spills onto the floor.  Remove soap buildup in the tub or shower on a regular basis.  Use non-skid mats or decals on the floor of the tub or shower.  Attach bath mats securely with double-sided,  non-slip rug tape.  If you need to sit down while you are in the shower, use a plastic, non-slip stool.  Install grab bars by the toilet and in the tub and shower. Do not use towel bars as grab bars. What can I do in the bedroom?  Make sure that a bedside light is easy to reach.  Do not use oversized bedding that drapes onto the floor.  Have a firm chair that has side arms to use for getting dressed. What can I do in the kitchen?  Clean up any spills right away.  If you need to reach for something above you, use a sturdy step stool that has a grab bar.  Keep electrical cables out of the way.  Do not use floor polish or wax that makes floors slippery. If you must use wax, make sure that it is non-skid floor wax. What can I do in the stairways?  Do not leave any items on the stairs.  Make sure that you have a light switch at the top of the stairs and the bottom of the stairs. Have them installed if you do not have them.  Make sure that there are handrails on both sides of the stairs. Fix handrails that are broken or loose. Make sure that handrails are as long as the stairways.  Install non-slip stair treads on all stairs in your home.  Avoid having  throw rugs at the top or bottom of stairways, or secure the rugs with carpet tape to prevent them from moving.  Choose a carpet design that does not hide the edge of steps on the stairway.  Check any carpeting to make sure that it is firmly attached to the stairs. Fix any carpet that is loose or worn. What can I do on the outside of my home?  Use bright outdoor lighting.  Regularly repair the edges of walkways and driveways and fix any cracks.  Remove high doorway thresholds.  Trim any shrubbery on the main path into your home.  Regularly check that handrails are securely fastened and in good repair. Both sides of any steps should have handrails.  Install guardrails along the edges of any raised decks or porches.  Clear  walkways of debris and clutter, including tools and rocks.  Have leaves, snow, and ice cleared regularly.  Use sand or salt on walkways during winter months.  In the garage, clean up any spills right away, including grease or oil spills. What other actions can I take?  Wear closed-toe shoes that fit well and support your feet. Wear shoes that have rubber soles or low heels.  Use mobility aids as needed, such as canes, walkers, scooters, and crutches.  Review your medicines with your health care provider. Some medicines can cause dizziness or changes in blood pressure, which increase your risk of falling. Talk with your health care provider about other ways that you can decrease your risk of falls. This may include working with a physical therapist or trainer to improve your strength, balance, and endurance. Where to find more information  Centers for Disease Control and Prevention, STEADI: TVDivision.uyhttps://www.cdc.gov  [employer] on Aging: RingConnections.sihttps://go4life.nia.nih.gov Contact a health care provider if:  You are afraid of falling at home.  You feel weak, drowsy, or dizzy at home.  You fall at home. Summary  There are many simple things that you can do to make your home safe and to help prevent falls.  Ways to make your home safe include removing tripping hazards and installing grab bars in the bathroom.  Ask for help when making these changes in your home. This information is not intended to replace advice given to you by your health care provider. Make sure you discuss any questions you have with your health care provider. Document Released: 05/25/2002 Document Revised: 01/17/2017 Document Reviewed: 01/17/2017 Elsevier Interactive Patient Education  2019 Elsevier Inc.    [name] [name] , Thank you for taking time to come for your Medicare Wellness Visit. I appreciate your ongoing commitment to your health goals. Please review the following plan we discussed and let me know if I  can assist you in the future.   These are the goals we discussed: Goals    . Exercise 150 min/wk Moderate Activity     Will try to go to silver sneakers     . Patient Stated     To be at true peace with myself, read bible more, go to church services more frequently. To be free from falls!         This is a list of the screening recommended for you and due dates:  Health Maintenance  Topic Date Due  . Pap Smear  07/10/2019  . Mammogram  03/26/2020  . Tetanus Vaccine  04/21/2023  . Colon Cancer Screening  04/25/2026  . Flu Shot  Completed  .  Hepatitis C: One time screening is recommended by  Center for Disease Control  (CDC) for  adults born from [birth year] through 1966.   Completed  . HIV Screening  Completed

## 2018-07-11 ENCOUNTER — Encounter: Payer: Self-pay | Admitting: *Deleted

## 2018-07-11 ENCOUNTER — Telehealth: Payer: Self-pay | Admitting: Family Medicine

## 2018-07-11 DIAGNOSIS — R413 Other amnesia: Secondary | ICD-10-CM

## 2018-07-11 NOTE — Telephone Encounter (Signed)
Patient called to check on the status of her medication refill for her pain.  Please advise and call patient at [phone]

## 2018-07-11 NOTE — Telephone Encounter (Signed)
She is actually scheduled for a PMV on Monday and we will do refills then

## 2018-07-11 NOTE — Telephone Encounter (Signed)
My chart message has been sent to the pt to remind her of her appt on Monday and that we will do her refills at that time.

## 2018-07-11 NOTE — Telephone Encounter (Signed)
pts husband Melvern Sample(John Mullan) dropped off Long Term Disability Form to be completed by the provider.  Pt would like to be called at [phone] and leave a message for pick up.  Form placed in providers folder.

## 2018-07-11 NOTE — Telephone Encounter (Signed)
Dr. Clent Ridges please advise of refills of pain meds.  Thanks

## 2018-07-13 ENCOUNTER — Inpatient Hospital Stay: Admission: RE | Admit: 2018-07-13 | Payer: Self-pay | Source: Ambulatory Visit

## 2018-07-13 LAB — PAIN MGMT, PROFILE 8 W/CONF, U
6 Acetylmorphine: NEGATIVE ng/mL (ref <10)
Alcohol Metabolites: NEGATIVE ng/mL (ref <500)
Alphahydroxyalprazolam: 1254 ng/mL — ABNORMAL HIGH (ref <25)
Alphahydroxymidazolam: NEGATIVE ng/mL (ref <50)
Alphahydroxytriazolam: NEGATIVE ng/mL (ref <50)
Aminoclonazepam: NEGATIVE ng/mL (ref <25)
Amphetamines: NEGATIVE ng/mL (ref <500)
Benzodiazepines: POSITIVE ng/mL — AB (ref <100)
Buprenorphine, Urine: NEGATIVE ng/mL (ref <5)
Cocaine Metabolite: NEGATIVE ng/mL (ref <150)
Codeine: NEGATIVE ng/mL (ref <50)
Creatinine: 96.2 mg/dL
Hydrocodone: 14794 ng/mL — ABNORMAL HIGH (ref <50)
Hydromorphone: 562 ng/mL — ABNORMAL HIGH (ref <50)
Hydroxyethylflurazepam: NEGATIVE ng/mL (ref <50)
Lorazepam: NEGATIVE ng/mL (ref <50)
MDMA: NEGATIVE ng/mL (ref <500)
Marijuana Metabolite: NEGATIVE ng/mL (ref <20)
Morphine: NEGATIVE ng/mL (ref <50)
Nordiazepam: NEGATIVE ng/mL (ref <50)
Norhydrocodone: 8264 ng/mL — ABNORMAL HIGH (ref <50)
Opiates: POSITIVE ng/mL — AB (ref <100)
Oxazepam: NEGATIVE ng/mL (ref <50)
Oxidant: NEGATIVE ug/mL (ref <200)
Oxycodone: NEGATIVE ng/mL (ref <100)
Temazepam: NEGATIVE ng/mL (ref <50)
pH: 6.47 (ref 4.5–9.0)

## 2018-07-14 ENCOUNTER — Encounter: Payer: Self-pay | Admitting: Family Medicine

## 2018-07-14 ENCOUNTER — Ambulatory Visit (INDEPENDENT_AMBULATORY_CARE_PROVIDER_SITE_OTHER): Payer: PPO | Admitting: Family Medicine

## 2018-07-14 VITALS — BP 138/70 | HR 74 | Temp 97.8°F | Wt 186.5 lb

## 2018-07-14 DIAGNOSIS — F1190 Opioid use, unspecified, uncomplicated: Secondary | ICD-10-CM | POA: Diagnosis not present

## 2018-07-14 DIAGNOSIS — G8929 Other chronic pain: Secondary | ICD-10-CM

## 2018-07-14 DIAGNOSIS — M5440 Lumbago with sciatica, unspecified side: Secondary | ICD-10-CM | POA: Diagnosis not present

## 2018-07-14 MED ORDER — HYDROCODONE-ACETAMINOPHEN 10-325 MG PO TABS: 1.0000 | ORAL_TABLET | Freq: Four times a day (QID) | ORAL | 0 refills | Status: DC | PRN

## 2018-07-14 MED ORDER — HYDROCODONE-ACETAMINOPHEN 10-325 MG PO TABS: 1.0000 | ORAL_TABLET | Freq: Four times a day (QID) | ORAL | 0 refills | Status: AC | PRN

## 2018-07-14 NOTE — Progress Notes (Signed)
   Subjective:    Patient ID: [name] [name], female    DOB: 1955/11/19, 63 y.o.   MRN: [id number]  HPI Here for pain management. She has been doing well as far as pain control. She recently saw Dr. Anne Hahn for a Neurology consult and he suggested another round of vestibular rehab for the dizziness.  Indication for chronic opioid: low back pain Medication and dose: Norco 10-325  # pills per month: 120 Last UDS date: 07-10-18 Opioid Treatment Agreement signed (Y/N): 09-17-17 Opioid Treatment Agreement last reviewed with patient:  07-14-18 NCCSRS reviewed this encounter (include red flags):  07-14-18    Review of Systems  Constitutional: Negative.   Respiratory: Negative.   Cardiovascular: Negative.   Musculoskeletal: Positive for back pain.  Neurological: Positive for dizziness.       Objective:   Physical Exam Constitutional:      Appearance: Normal appearance.     Comments: Walks with a cane   Cardiovascular:     Rate and Rhythm: Normal rate and regular rhythm.     Pulses: Normal pulses.     Heart sounds: Normal heart sounds.  Pulmonary:     Effort: Pulmonary effort is normal.     Breath sounds: Normal breath sounds.  Neurological:     General: No focal deficit present.     Mental Status: She is alert and oriented to person, place, and time.           Assessment & Plan:  Pain management, meds are refilled.  Gershon Crane, MD

## 2018-07-16 DIAGNOSIS — R413 Other amnesia: Secondary | ICD-10-CM

## 2018-07-16 DIAGNOSIS — G4733 Obstructive sleep apnea (adult) (pediatric): Secondary | ICD-10-CM | POA: Diagnosis not present

## 2018-07-16 NOTE — Telephone Encounter (Signed)
Called and spoke with [name] [name] and he is aware that forms are up front and ready to be picked up.

## 2018-07-17 NOTE — Telephone Encounter (Signed)
Pt. Seen by Dr. Clent Ridges 1/27, hydrocodone ordered by PCP. No need for follow-up at this time.

## 2018-07-21 ENCOUNTER — Emergency Department (HOSPITAL_COMMUNITY)
Admission: EM | Admit: 2018-07-21 | Discharge: 2018-07-21 | Disposition: A | Discharge status: Home / Self Care | Payer: PPO | Attending: Emergency Medicine | Admitting: Emergency Medicine

## 2018-07-21 ENCOUNTER — Encounter (HOSPITAL_COMMUNITY): Payer: Self-pay | Admitting: Emergency Medicine

## 2018-07-21 DIAGNOSIS — R531 Weakness: Secondary | ICD-10-CM | POA: Diagnosis not present

## 2018-07-21 DIAGNOSIS — Z5321 Procedure and treatment not carried out due to patient leaving prior to being seen by health care provider: Secondary | ICD-10-CM | POA: Diagnosis not present

## 2018-07-21 LAB — BASIC METABOLIC PANEL
Anion gap: 13 (ref 5–15)
BUN: 9 mg/dL (ref 8–23)
CO2: 23 mmol/L (ref 22–32)
Calcium: 10.1 mg/dL (ref 8.9–10.3)
Chloride: 104 mmol/L (ref 98–111)
Creatinine, Ser: 0.62 mg/dL (ref 0.44–1.00)
GFR calc Af Amer: >60 mL/min (ref >60)
GFR calc non Af Amer: >60 mL/min (ref >60)
Glucose, Bld: 125 mg/dL — ABNORMAL HIGH (ref 70–99)
Potassium: 3.3 mmol/L — ABNORMAL LOW (ref 3.5–5.1)
Sodium: 140 mmol/L (ref 135–145)

## 2018-07-21 LAB — CBC
HCT: 41.4 % (ref 36.0–46.0)
Hemoglobin: 13.4 g/dL (ref 12.0–15.0)
MCH: 29.5 pg (ref 26.0–34.0)
MCHC: 32.4 g/dL (ref 30.0–36.0)
MCV: 91.2 fL (ref 80.0–100.0)
Platelets: 192 10*3/uL (ref 150–400)
RBC: 4.54 MIL/uL (ref 3.87–5.11)
RDW: 13.1 % (ref 11.5–15.5)
WBC: 7.7 10*3/uL (ref 4.0–10.5)
nRBC: 0 % (ref 0.0–0.2)

## 2018-07-21 MED ORDER — SODIUM CHLORIDE 0.9% FLUSH: 3.0000 mL | Freq: Once | INTRAVENOUS | Status: DC

## 2018-07-21 NOTE — ED Notes (Signed)
Pt states she can not wait any longer and is leaving.  

## 2018-07-21 NOTE — ED Triage Notes (Addendum)
Pt reports legs and arms felt like "noodles" at 1400 today, right facial droop present but is her baseline. Husband reports bp at home was 210/120. Slurred speech noted. A&O x 4 with slow responses. Husband reports pt stopped taking her xanax "cold [place]" 1 week ago. Recent concussion 1 month ago.

## 2018-07-21 NOTE — Telephone Encounter (Signed)
Patient states she is not eligible for a new mask until March so she will purchased a full face mask. [facility] sent her a new water chamber that has helped her mask to seal better. A download showing a AHI= 50.8  has been sent to be scanned for your viewing.

## 2018-07-23 ENCOUNTER — Ambulatory Visit (INDEPENDENT_AMBULATORY_CARE_PROVIDER_SITE_OTHER): Payer: PPO | Admitting: Family Medicine

## 2018-07-23 ENCOUNTER — Encounter: Payer: Self-pay | Admitting: Family Medicine

## 2018-07-23 VITALS — BP 140/90 | HR 76 | Temp 98.8°F | Wt 177.2 lb

## 2018-07-23 DIAGNOSIS — F3132 Bipolar disorder, current episode depressed, moderate: Secondary | ICD-10-CM

## 2018-07-23 DIAGNOSIS — F13230 Sedative, hypnotic or anxiolytic dependence with withdrawal, uncomplicated: Secondary | ICD-10-CM

## 2018-07-23 DIAGNOSIS — G4700 Insomnia, unspecified: Secondary | ICD-10-CM | POA: Diagnosis not present

## 2018-07-23 DIAGNOSIS — E876 Hypokalemia: Secondary | ICD-10-CM | POA: Diagnosis not present

## 2018-07-23 DIAGNOSIS — G4733 Obstructive sleep apnea (adult) (pediatric): Secondary | ICD-10-CM

## 2018-07-23 DIAGNOSIS — R413 Other amnesia: Secondary | ICD-10-CM | POA: Diagnosis not present

## 2018-07-23 DIAGNOSIS — I10 Essential (primary) hypertension: Secondary | ICD-10-CM | POA: Diagnosis not present

## 2018-07-23 DIAGNOSIS — F419 Anxiety disorder, unspecified: Secondary | ICD-10-CM | POA: Diagnosis not present

## 2018-07-23 DIAGNOSIS — F13930 Sedative, hypnotic or anxiolytic use, unspecified with withdrawal, uncomplicated: Secondary | ICD-10-CM

## 2018-07-23 HISTORY — DX: Hypokalemia: E87.6

## 2018-07-23 MED ORDER — QUETIAPINE FUMARATE 25 MG PO TABS: 25.0000 mg | ORAL_TABLET | Freq: Every day | ORAL | 0 refills | Status: DC

## 2018-07-23 MED ORDER — POTASSIUM CHLORIDE ER 10 MEQ PO TBCR: 10.0000 meq | EXTENDED_RELEASE_TABLET | Freq: Every day | ORAL | 5 refills | Status: DC

## 2018-07-23 NOTE — Progress Notes (Signed)
   Subjective:    Patient ID: [name] [name], female    DOB: 01/27/1956, 63 y.o.   MRN: [id number]  HPI Here to follow up an ER visit yesterday for the sudden onset of lightheadedness, losing control of her arms and legs, difficulty walking, and difficulty speaking. She was there only long enough to ave some labs drawn, then she left without being seen by a doctor. Her CBC was normal, and a BMET was remarkable only for a low potassium of 3.3.  Today her boyfriend tells me that the day before this [name] decided to totally stop taking any Xanax (she has been taking 3 a day for years). After she got back home from the ER she took a Xanax and immediately felt better. Since then she has taken 2 more, and today she feels back to her baseline. Of note she is scheduled to see Dr. Everlena Cooper for a Neurology evaluation on 08-11-18. Dr. Armanda Magic has diagnosed [name] with sleep apnea after a recent sleep study, and [name] will follow up with her for adjustments to her CPAP machine. [name] has also made arrangements with [facility] to start meeting with a therapist again. Of note we have had several discussions recently about her getting off some of her sedating medications in order to improve her memory and her thought clarity, and she has stopped Lamictal.   Review of Systems  Constitutional: Negative.   Respiratory: Negative.   Cardiovascular: Negative.   Neurological: Positive for dizziness, speech difficulty and light-headedness. Negative for tremors, seizures, syncope, facial asymmetry, weakness, numbness and headaches.  Psychiatric/Behavioral: Positive for confusion and decreased concentration. Negative for dysphoric mood and hallucinations. The patient is not nervous/anxious.        Objective:   Physical Exam Constitutional:      Appearance: Normal appearance.  Cardiovascular:     Rate and Rhythm: Normal rate and regular rhythm.     Pulses: Normal pulses.     Heart sounds: Normal heart  sounds.  Pulmonary:     Effort: Pulmonary effort is normal.     Breath sounds: Normal breath sounds.  Neurological:     General: No focal deficit present.     Mental Status: She is alert and oriented to person, place, and time. Mental status is at baseline.           Assessment & Plan:  I think yesterday's symptoms were the result of withdrawing too rapidly from Xanax, and she was likely on the verge of having a seizure. I told her that I also want to help her get off as many of her sedating or habit forming medications as possible, but that we need to work together to do this and that we need to proceed carefully and slowly. I told her to get back on Xanax 2 mg TID like before, but we will decrease the Seroquel to 25 mg nightly for a few weeks. I hope to stop this entirely in the next 4 weeks. Then we can start weaning down on the Xanax. I also started her on potassium supplementation. Recheck here in 4 weeks.  Gershon Crane, MD

## 2018-07-24 ENCOUNTER — Other Ambulatory Visit: Payer: Self-pay

## 2018-07-24 NOTE — Patient Outreach (Signed)
[facility]) Care Management  07/24/2018  Averyl Zeringue [birth year] [id number]   Referral Date: 07/24/2018 Referral Source: Um referral Referral Reason: Unable to afford CPAP mask  Outreach Attempt: No answer.  HIPAA compliant voice message left.   Plan: RN CM will attempt again within 4 days and send letter.     Bary Leriche, RN, MSN [facility] Care Management Care Management Coordinator Direct Line [phone] Toll Free: [phone]  Fax: [phone]

## 2018-07-25 ENCOUNTER — Other Ambulatory Visit: Payer: Self-pay | Admitting: *Deleted

## 2018-07-25 ENCOUNTER — Other Ambulatory Visit: Payer: Self-pay

## 2018-07-25 NOTE — Patient Outreach (Signed)
[facility] Care Management  07/25/2018  [name] GibbonsShelia Kay [name] [birth year] [id number]   CSW received referral and attempted to reach pt today by phone. A HIPPA compliant voice message was left and will await callback or try again in 3-4 business days.   Reece LevyJanet Kaleena Corrow, MSW, LCSW Clinical Social Worker  [facility] [phone]
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
## 2018-07-25 NOTE — Patient Outreach (Signed)
[facility]) Care Management  07/25/2018  [name] [name] [birth year] [id number]  CSW was able to make contact with pt and confirm her identity. Pt reports she cannot afford the CPAP mask she needs and that her insurance wont cover it until April. She admits to having cognitive impairment from a concussion and other medical conditions. She prefers that her caregiver/ex-husband provide me the details and he is working now. CSW will call again on Monday and offered for her to have him call me back also.   Reece Levy, MSW, LCSW Clinical Social Worker  [facility] [phone]

## 2018-07-28 ENCOUNTER — Other Ambulatory Visit: Payer: Self-pay | Admitting: *Deleted

## 2018-07-28 NOTE — Patient Outreach (Signed)
[facility]) Care Management  07/28/2018  [name] [name] [birth year] [id number]   [facility]) Care Management  07/28/2018  [name] [name] 12/15/1955 [id number]   CSW spoke with pt by phone last week and she gave permission for outreach call to [facility]) CSW made contact with [name] today by phone and confirmed pt's identity. CSW inquired with him about pt's CPAP and the needs. Per [name], pt has a CPAP provided through [facility] She has been fitted for a "full mask" but insurance  won't cover it until April".   CSW contacted [facility] and per Sheralyn Boatman with the DME Supply Dept she will be eligible for a frame, full mask and a headgear on March 20th. To get her through unitl that date, Sheralyn Boatman is arranging for pt to get 4 new nasal pillows for her current system which should help with the seal and episodes she has been having.  CSW contacted pt and her HCPOA and advised of this plan- they are appreciaitive and hopeful this will get her through until MArch 20th when she is eligible for new frame, headgear ($25 cost) and a full mask.   CSW advised HCPOA to call CSW and/or [facility] if problems arise with this plan as above. Shipping will take 5-7 days and they report she has some extra "Pillows" and they can use those while they wait on the extra supply of "pillows".  Pt states, "I feel blessed".   CSW will update PCP and [facility] team of above and plans case closure. CSW advised pt and HCPOA to call CSW and/or [facility] if there are issues with the CPAP equipment and plans.     Reece Levy, MSW, LCSW Clinical Social Worker  [facility] [phone]

## 2018-07-29 DIAGNOSIS — G4733 Obstructive sleep apnea (adult) (pediatric): Secondary | ICD-10-CM | POA: Diagnosis not present

## 2018-08-04 DIAGNOSIS — H43393 Other vitreous opacities, bilateral: Secondary | ICD-10-CM | POA: Diagnosis not present

## 2018-08-04 DIAGNOSIS — H40033 Anatomical narrow angle, bilateral: Secondary | ICD-10-CM | POA: Diagnosis not present

## 2018-08-06 ENCOUNTER — Telehealth: Payer: Self-pay | Admitting: *Deleted

## 2018-08-06 NOTE — Telephone Encounter (Signed)
Patient called back to say she has the nasal pillows but she is going to a full face mask and has already been fitted. Patient goes to sleep on her back because she has to elevate her legs due to several back surgeries but she does roll to her side.

## 2018-08-07 DIAGNOSIS — R42 Dizziness and giddiness: Secondary | ICD-10-CM | POA: Diagnosis not present

## 2018-08-07 DIAGNOSIS — Z01419 Encounter for gynecological examination (general) (routine) without abnormal findings: Secondary | ICD-10-CM | POA: Diagnosis not present

## 2018-08-07 DIAGNOSIS — H8109 Meniere's disease, unspecified ear: Secondary | ICD-10-CM

## 2018-08-07 DIAGNOSIS — Z6832 Body mass index (BMI) 32.0-32.9, adult: Secondary | ICD-10-CM | POA: Diagnosis not present

## 2018-08-07 HISTORY — DX: Meniere's disease, unspecified ear: H81.09
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
## 2018-08-11 ENCOUNTER — Encounter: Payer: Self-pay | Admitting: Neurology

## 2018-08-11 ENCOUNTER — Ambulatory Visit (INDEPENDENT_AMBULATORY_CARE_PROVIDER_SITE_OTHER): Payer: PPO | Admitting: Neurology

## 2018-08-11 ENCOUNTER — Encounter

## 2018-08-11 ENCOUNTER — Encounter (INDEPENDENT_AMBULATORY_CARE_PROVIDER_SITE_OTHER): Payer: PPO | Admitting: Ophthalmology

## 2018-08-11 VITALS — BP 138/88 | HR 74 | Ht 62.0 in | Wt 178.0 lb

## 2018-08-11 DIAGNOSIS — F419 Anxiety disorder, unspecified: Secondary | ICD-10-CM

## 2018-08-11 DIAGNOSIS — G44309 Post-traumatic headache, unspecified, not intractable: Secondary | ICD-10-CM

## 2018-08-11 DIAGNOSIS — R42 Dizziness and giddiness: Secondary | ICD-10-CM

## 2018-08-11 NOTE — Patient Instructions (Signed)
I think a lot of your symptoms are related to anxiety and overuse of medication.  I recommend to continue working with Dr. Clent Ridges to taper off of alprazolam and to continue working with your psychiatrist and therapist.  Once anxiety is better controlled and if you still have headaches, follow up with me to discuss treatment.

## 2018-08-18 ENCOUNTER — Other Ambulatory Visit: Payer: Self-pay | Admitting: Family Medicine

## 2018-08-19 ENCOUNTER — Encounter (INDEPENDENT_AMBULATORY_CARE_PROVIDER_SITE_OTHER): Payer: PPO | Admitting: Ophthalmology

## 2018-08-20 NOTE — Telephone Encounter (Signed)
Call in #90 with 5 rf 

## 2018-08-20 NOTE — Telephone Encounter (Signed)
Called the pharmacy and gave the refills over the phone to the pharmacists.

## 2018-08-20 NOTE — Telephone Encounter (Signed)
Pt called and stated that she is completely out and would like to know if this can be called in as soon as possible.   Please advise

## 2018-08-20 NOTE — Telephone Encounter (Signed)
Dr. Fry please advise on refill of medication.   

## 2018-08-21 ENCOUNTER — Other Ambulatory Visit: Payer: Self-pay | Admitting: Family Medicine

## 2018-08-21 NOTE — Telephone Encounter (Signed)
Dr. Fry please advise on refill. Thanks  

## 2018-08-22 ENCOUNTER — Ambulatory Visit (INDEPENDENT_AMBULATORY_CARE_PROVIDER_SITE_OTHER): Payer: PPO | Admitting: Family Medicine

## 2018-08-22 ENCOUNTER — Encounter: Payer: Self-pay | Admitting: Family Medicine

## 2018-08-22 VITALS — BP 128/84 | HR 79 | Temp 98.4°F | Ht 62.0 in | Wt 183.1 lb

## 2018-08-22 DIAGNOSIS — R42 Dizziness and giddiness: Secondary | ICD-10-CM

## 2018-08-22 DIAGNOSIS — M5440 Lumbago with sciatica, unspecified side: Secondary | ICD-10-CM

## 2018-08-22 DIAGNOSIS — R413 Other amnesia: Secondary | ICD-10-CM | POA: Diagnosis not present

## 2018-08-22 DIAGNOSIS — F3132 Bipolar disorder, current episode depressed, moderate: Secondary | ICD-10-CM | POA: Diagnosis not present

## 2018-08-22 DIAGNOSIS — K219 Gastro-esophageal reflux disease without esophagitis: Secondary | ICD-10-CM | POA: Diagnosis not present

## 2018-08-22 DIAGNOSIS — G8929 Other chronic pain: Secondary | ICD-10-CM | POA: Diagnosis not present

## 2018-08-22 DIAGNOSIS — I10 Essential (primary) hypertension: Secondary | ICD-10-CM

## 2018-08-22 DIAGNOSIS — G4700 Insomnia, unspecified: Secondary | ICD-10-CM

## 2018-08-22 MED ORDER — MELOXICAM 15 MG PO TABS: 15.0000 mg | ORAL_TABLET | Freq: Every day | ORAL | 2 refills | Status: DC

## 2018-08-22 MED ORDER — HYDROXYZINE HCL 50 MG PO TABS: 50.0000 mg | ORAL_TABLET | Freq: Every day | ORAL | 2 refills | Status: DC

## 2018-08-22 NOTE — Progress Notes (Signed)
   Subjective:    Patient ID: [name] [name], female    DOB: March 17, 1956, 63 y.o.   MRN: [id number]  HPI Here to follow up on issues including depression with anxiety, insomnia, chronic dizziness, and side effects of polypharmacy. At our last visit she agreed to stop Lamictal and she has stopped taking Seroquel. She has decreased her use of Hydrocodone to 2 or 3 a day instead of 4. She had been using a lot of Goody powders for pain relief but had to stop due to GI upset. Now she is using Aleve but this still bothers her. She has been unable to decrease her use of Xanax. She cannot sleep well at night and she asks to try Hydroxyzine, since a friend has had good results from using this. She has tried Melatonin OTC with no success. [name] [name] saw Dr. Everlena [name] on 08-11-18 for dizziness and memory loss,  and his main focus right now is to reduce her use of sedating medications. She is meeting with her therapist regularly and she finds this helpful.    Review of Systems  Constitutional: Negative.   Respiratory: Negative.   Cardiovascular: Negative.   Neurological: Positive for dizziness, light-headedness and headaches.  Psychiatric/Behavioral: Positive for decreased concentration, dysphoric mood and sleep disturbance. The patient is nervous/anxious.        Objective:   Physical Exam Constitutional:      Appearance: Normal appearance. She is not ill-appearing.  Cardiovascular:     Rate and Rhythm: Normal rate and regular rhythm.     Pulses: Normal pulses.     Heart sounds: Normal heart sounds.  Pulmonary:     Effort: Pulmonary effort is normal.     Breath sounds: Normal breath sounds.  Neurological:     General: No focal deficit present.     Mental Status: She is alert and oriented to person, place, and time. Mental status is at baseline.     Gait: Gait normal.  Psychiatric:        Mood and Affect: Mood normal.        Thought Content: Thought content normal.        Judgment: Judgment normal.            Assessment & Plan:  Her depression and anxiety seem to be fairly well controlled at this point. For the insomnia she will try Hydroxyzine 50 mg qhs. For pain control she will use a little Hydrocodone as she can, but instead of Aleve she will try Meloxicam 15 mg a day. We spent 50 minutes discussing these issues today.  Gershon Crane, MD

## 2018-08-29 ENCOUNTER — Ambulatory Visit: Payer: Self-pay | Admitting: *Deleted

## 2018-08-29 DIAGNOSIS — S52501A Unspecified fracture of the lower end of right radius, initial encounter for closed fracture: Secondary | ICD-10-CM | POA: Diagnosis not present

## 2018-08-29 HISTORY — PX: ORIF WRIST FRACTURE: SHX2133

## 2018-08-29 NOTE — Telephone Encounter (Signed)
Message from Fanny Bien sent at 08/29/2018 4:29 PM EDT ----- meloxicam (MOBIC) 15 MG tablet [[id number]] pt called and stated that she broke her wrist and wanted to know if she could take two pills a day. Please advise   Left message for pt to return call to office. Prescription for Meloxicam written to be taken once daily. 15 mg is the maximum dose that can be taken daily.   Pt returned call to office and notified that Meloxicam can only be taken daily and that she could take Hydorcodone prn as needed for pain per instructions on current prescription. Pt verbalized understanding. No additional concerns voiced at this time.

## 2018-08-29 NOTE — Telephone Encounter (Signed)
  Reason for Disposition . Caller has medication question, adult has minor symptoms, caller declines triage, and triager answers question  Answer Assessment - Initial Assessment Questions 1. SYMPTOMS: "Do you have any symptoms?"     Wrist is swollen and painful  Protocols used: MEDICATION QUESTION CALL-A-AH

## 2018-09-01 ENCOUNTER — Encounter (INDEPENDENT_AMBULATORY_CARE_PROVIDER_SITE_OTHER): Payer: PPO | Admitting: Ophthalmology

## 2018-09-01 DIAGNOSIS — S52501A Unspecified fracture of the lower end of right radius, initial encounter for closed fracture: Secondary | ICD-10-CM | POA: Diagnosis not present

## 2018-09-02 DIAGNOSIS — S52501A Unspecified fracture of the lower end of right radius, initial encounter for closed fracture: Secondary | ICD-10-CM | POA: Diagnosis not present

## 2018-09-02 DIAGNOSIS — X58XXXA Exposure to other specified factors, initial encounter: Secondary | ICD-10-CM | POA: Diagnosis not present

## 2018-09-02 DIAGNOSIS — S52571A Other intraarticular fracture of lower end of right radius, initial encounter for closed fracture: Secondary | ICD-10-CM | POA: Diagnosis not present

## 2018-09-02 DIAGNOSIS — G8918 Other acute postprocedural pain: Secondary | ICD-10-CM | POA: Diagnosis not present

## 2018-09-02 DIAGNOSIS — Y999 Unspecified external cause status: Secondary | ICD-10-CM | POA: Diagnosis not present

## 2018-09-03 ENCOUNTER — Other Ambulatory Visit (HOSPITAL_COMMUNITY): Payer: Self-pay | Admitting: Orthopedic Surgery

## 2018-09-03 MED ORDER — OXYCODONE HCL 5 MG PO TABS: 5.0000 mg | ORAL_TABLET | ORAL | 0 refills | Status: DC | PRN

## 2018-09-04 DIAGNOSIS — G4733 Obstructive sleep apnea (adult) (pediatric): Secondary | ICD-10-CM | POA: Diagnosis not present

## 2018-09-15 ENCOUNTER — Telehealth: Payer: Self-pay | Admitting: Cardiology

## 2018-09-15 DIAGNOSIS — S52501D Unspecified fracture of the lower end of right radius, subsequent encounter for closed fracture with routine healing: Secondary | ICD-10-CM | POA: Diagnosis not present

## 2018-09-15 NOTE — Telephone Encounter (Signed)
New Message:    Please call, pressure with her C Pap is too high.i

## 2018-09-15 NOTE — Telephone Encounter (Signed)
Does she want me to reorder a gel mask

## 2018-09-19 NOTE — Telephone Encounter (Signed)
No, patient does not want a new gel mask but states she feels so much better with her new full face mask. Patient states it woks perfect. She has been sleeping close to 10 hours a night.

## 2018-09-20 NOTE — Telephone Encounter (Signed)
So is she ok with current pressure?

## 2018-09-22 ENCOUNTER — Encounter (INDEPENDENT_AMBULATORY_CARE_PROVIDER_SITE_OTHER): Payer: PPO | Admitting: Ophthalmology

## 2018-09-22 NOTE — Telephone Encounter (Signed)
Yes, she is ok with her current pressure

## 2018-09-26 ENCOUNTER — Telehealth: Payer: Self-pay

## 2018-09-26 NOTE — Telephone Encounter (Signed)
TELEPHONE CALL NOTE  [name] [name] has been deemed a candidate for a follow-up tele-health visit to limit community exposure during the Covid-19 pandemic. I spoke with the patient via phone to ensure availability of phone/video source, confirm preferred email & phone number, and discuss instructions and expectations.  I reminded [name] [name] to be prepared with any vital sign and/or heart rhythm information that could potentially be obtained via home monitoring, at the time of her visit. I reminded [name] [name] to expect a phone call at the time of her visit if her visit.  Did the patient verbally acknowledge consent to treatment? YES   Dustin Flock, RN 09/26/2018 3:35 PM   DOWNLOADING THE WEBEX SOFTWARE TO SMARTPHONE  - If Apple, go to [employer] and type in WebEx in the search bar. Download [employer], the blue/green circle. The app is free but as with any other app downloads, their phone may require them to verify saved payment information or Apple password. The patient does NOT have to create an account.  - If Android, ask patient to go to [employer] and type in WebEx in the search bar. Download [employer], the blue/green circle. The app is free but as with any other app downloads, their phone may require them to verify saved payment information or Android password. The patient does NOT have to create an account.   CONSENT FOR TELE-HEALTH VISIT - PLEASE REVIEW  I hereby voluntarily request, consent and authorize [facility] and its employed or contracted physicians, physician assistants, nurse practitioners or other licensed health care professionals (the Practitioner), to provide me with telemedicine health care services (the "Services") as deemed necessary by the treating Practitioner. I acknowledge and consent to receive the Services by the Practitioner via telemedicine. I understand that the telemedicine visit will involve communicating  with the Practitioner through live audiovisual communication technology and the disclosure of certain medical information by electronic transmission. I acknowledge that I have been given the opportunity to request an in-person assessment or other available alternative prior to the telemedicine visit and am voluntarily participating in the telemedicine visit.  I understand that I have the right to withhold or withdraw my consent to the use of telemedicine in the course of my care at any time, without affecting my right to future care or treatment, and that the Practitioner or I may terminate the telemedicine visit at any time. I understand that I have the right to inspect all information obtained and/or recorded in the course of the telemedicine visit and may receive copies of available information for a reasonable fee.  I understand that some of the potential risks of receiving the Services via telemedicine include:  [name] Delay or interruption in medical evaluation due to technological equipment failure or disruption; . Information transmitted may not be sufficient (e.g. poor resolution of images) to allow for appropriate medical decision making by the Practitioner; and/or  . In rare instances, security protocols could fail, causing a breach of personal health information.  Furthermore, I acknowledge that it is my responsibility to provide information about my medical history, conditions and care that is complete and accurate to the best of my ability. I acknowledge that Practitioner's advice, recommendations, and/or decision may be based on factors not within their control, such as incomplete or inaccurate data provided by me or distortions of diagnostic images or specimens that may result from electronic transmissions. I understand that the practice of medicine is not  an Visual merchandiserexact science and that Practitioner makes no warranties or guarantees regarding treatment outcomes. I acknowledge that I will receive a copy  of this consent concurrently upon execution via email to the email address I last provided but may also request a printed copy by calling the office of [facility].    I understand that my insurance will be billed for this visit.   I have read or had this consent read to me. . I understand the contents of this consent, which adequately explains the benefits and risks of the Services being provided via telemedicine.  . I have been provided ample opportunity to ask questions regarding this consent and the Services and have had my questions answered to my satisfaction. . I give my informed consent for the services to be provided through the use of telemedicine in my medical care  By participating in this telemedicine visit I agree to the above.

## 2018-09-29 ENCOUNTER — Encounter: Payer: Self-pay | Admitting: Cardiology

## 2018-09-29 ENCOUNTER — Telehealth (INDEPENDENT_AMBULATORY_CARE_PROVIDER_SITE_OTHER): Payer: PPO | Admitting: Cardiology

## 2018-09-29 ENCOUNTER — Other Ambulatory Visit: Payer: Self-pay

## 2018-09-29 VITALS — Ht 62.0 in | Wt 167.0 lb

## 2018-09-29 DIAGNOSIS — G4733 Obstructive sleep apnea (adult) (pediatric): Secondary | ICD-10-CM

## 2018-09-29 DIAGNOSIS — E669 Obesity, unspecified: Secondary | ICD-10-CM | POA: Diagnosis not present

## 2018-09-29 DIAGNOSIS — I10 Essential (primary) hypertension: Secondary | ICD-10-CM | POA: Diagnosis not present

## 2018-09-29 DIAGNOSIS — Z7189 Other specified counseling: Secondary | ICD-10-CM

## 2018-09-29 NOTE — Patient Instructions (Signed)

## 2018-09-29 NOTE — Progress Notes (Signed)
Virtual Visit was attempted but due to technical difficulties and a broken wrist she could not get on Webex or facetime and visit converted to telephone Note   This visit type was conducted due to national recommendations for restrictions regarding the COVID-19 Pandemic (e.g. social distancing) in an effort to limit this patient's exposure and mitigate transmission in our community.  Due to her co-morbid illnesses, this patient is at least at moderate risk for complications without adequate follow up.  This format is felt to be most appropriate for this patient at this time.  All issues noted in this document were discussed and addressed.  A limited physical exam was performed with this format.  Please refer to the patient's chart for her consent to telehealth for [facility].  Evaluation Performed:  Follow-up visit  This visit type was conducted due to national recommendations for restrictions regarding the COVID-19 Pandemic (e.g. social distancing).  This format is felt to be most appropriate for this patient at this time.  All issues noted in this document were discussed and addressed.  No physical exam was performed (except for noted visual exam findings with Video Visits).  Please refer to the patient's chart (MyChart message for video visits and phone note for telephone visits) for the patient's consent to telehealth for [facility].  Date:  09/29/2018   ID:  [name] [name], DOB January 14, 1956, MRN [id number]  Patient Location:  Home  Provider location:   [place]  PCP:  Nelwyn Salisbury, MD  Sleep Medicine:  Armanda Magic, MD Electrophysiologist:  None   Chief Complaint:  OSA  History of Present Illness:    [name] [name] is a 63 y.o. female who presents via audio/video conferencing for a telehealth visit today.    [name] [name] is a 62y.o. female with a hx of OSA on CPAP therapy.She is doing well with her CPAP device and thinks that she has gotten used to it.  She  tolerates the mask and feels the pressure is adequate.  She is now using a chin strap which has worked [employer] and now she feels rested in the am and has no significant daytime sleepiness.  She is sleeping sometimes 10 hours at night.  She denies any significant mouth or nasal dryness or nasal congestion.  She does not think that he snores.  She started walking and cut out soda and she has lost 20lbs.    The patient does not have symptoms concerning for COVID-19 infection (fever, chills, cough, or new shortness of breath).    Prior CV studies:   The following studies were reviewed today:  PAP download  Past Medical History:  Diagnosis Date  . Allergy    seasonal  . Bipolar 1 disorder (HCC)   . CPAP (continuous positive airway pressure) dependence   . Depression    sees the [facility]   . GERD (gastroesophageal reflux disease)   . Hyperlipidemia   . Hypertension   . Memory loss    short term  . Meniere disease   . Obesity (BMI 30-39.9) 11/10/2015  . Post concussion syndrome 07/01/2018  . PPD positive 2007 or 2008  . Sleep apnea    on CPAP therapy  . Urinary incontinence   . Vestibular disorder    Past Surgical History:  Procedure Laterality Date  . ABDOMINAL HYSTERECTOMY    . ANTERIOR FUSION CERVICAL SPINE  2006   Dr. Wynetta Emery  . BREAST BIOPSY Right 1981   benign  .  BREAST EXCISIONAL BIOPSY Right 1981   benign   scar does not show  . BREAST SURGERY Bilateral 12-11-12   reductions per Dr. Ivin Booty in [facility]   . CATARACT EXTRACTION, BILATERAL Bilateral last summer of 2018   at [facility]  . COLONOSCOPY  04/25/2016   per Dr. Loreta Ave, adenomatous polyps, repeat in 5 yrs   . ESOPHAGOGASTRODUODENOSCOPY  04-23-11   per Dr. Loreta Ave, normal   . EYE SURGERY  06/18/2016   L eyelid   . LUMBAR DISC SURGERY  2003   Dr. Wynetta Emery  . REDUCTION MAMMAPLASTY Bilateral 2013  . SPLIT NIGHT STUDY  11/11/2015  . UVULOPALATOPHARYNGOPLASTY    . VAGINAL HYSTERECTOMY  12/96   secondary to  fibroids.     Current Meds  Medication Sig  . acyclovir (ZOVIRAX) 400 MG tablet TAKE 1 TABLET BY MOUTH 4 TIMES A DAY AS NEEDED  . alprazolam (XANAX) 2 MG tablet TAKE 1 TABLET BY MOUTH 3 TIMES A DAY AS NEEDED  . azelastine (ASTELIN) 0.1 % nasal spray Place 2 sprays into both nostrils 2 (two) times daily. Use in each nostril as directed  . b complex vitamins tablet Take 1 tablet by mouth every other day.   [name] CALCIUM-MAGNESIUM-ZINC PO Take by mouth daily.  . Cholecalciferol (VITAMIN D3) 2000 units capsule Take 2,000 Units by mouth daily.  [name] FLUoxetine (PROZAC) 10 MG tablet Take 2 tablets (20 mg total) by mouth daily.  . fluticasone (FLONASE) 50 MCG/ACT nasal spray Place 1 spray into both nostrils 2 (two) times daily.  [name] HYDROcodone-acetaminophen (NORCO) 10-325 MG tablet Take 1 tablet by mouth every 6 (six) hours as needed for up to 30 days for moderate pain.  . hydrOXYzine (ATARAX/VISTARIL) 50 MG tablet Take 1 tablet (50 mg total) by mouth at bedtime.  . meloxicam (MOBIC) 15 MG tablet Take 1 tablet (15 mg total) by mouth daily.  . NON FORMULARY   . omeprazole (PRILOSEC) 20 MG capsule Take 1 capsule (20 mg total) by mouth 2 (two) times daily before a meal.  . polyethylene glycol powder (GLYCOLAX/MIRALAX) powder TAKE 17 GRAMS BY MOUTH 2 TIMES DAILY AS NEEDED.  [name] potassium chloride (KLOR-CON 10) 10 MEQ tablet Take 1 tablet (10 mEq total) by mouth daily.  . Probiotic Product (PROBIOTIC PO) Take by mouth daily.  . Saline (SIMPLY SALINE) 0.9 % AERS Place 1 spray into both nostrils daily as needed.  . simvastatin (ZOCOR) 80 MG tablet Take 1 tablet (80 mg total) by mouth at bedtime.     Allergies:   Sulfonamide derivatives; Flexeril [cyclobenzaprine]; Morphine and related; Sulfa antibiotics; and Sulfamethoxazole   Social History   Tobacco Use  . Smoking status: Light Tobacco Smoker    Years: 30.00    Types: Cigarettes  . Smokeless tobacco: Never Used  . Tobacco comment: occasional smoker. used  CBD vapor but no longer does  Substance Use Topics  . Alcohol use: No    Alcohol/week: 0.0 standard drinks  . Drug use: No     Family Hx: The patient's family history includes Alcohol abuse in an other family member; Arthritis in an other family member; Asthma in her father; Coronary artery disease in her brother, sister, and another family member; Dementia in her mother; Depression in an other family member; Diabetes in an other family member; Hyperlipidemia in her brother, mother, sister, sister, and another family member; Hypertension in her brother, sister, sister, and another family member; Sudden death in an other family member; Thyroid disease  in her sister.  ROS:   Please see the history of present illness.     All other systems reviewed and are negative.   Labs/Other Tests and Data Reviewed:    Recent Labs: 11/08/2017: Magnesium 2.1 03/10/2018: ALT 13; TSH 1.03 07/21/2018: BUN 9; Creatinine, Ser 0.62; Hemoglobin 13.4; Platelets 192; Potassium 3.3; Sodium 140   Recent Lipid Panel Lab Results  Component Value Date/Time   CHOL 235 (H) 03/10/2018 10:35 AM   TRIG 246.0 (H) 03/10/2018 10:35 AM   HDL 47.30 03/10/2018 10:35 AM   CHOLHDL 5 03/10/2018 10:35 AM   LDLCALC 149 (H) 11/08/2017 04:34 PM   LDLDIRECT 146.0 03/10/2018 10:35 AM    Wt Readings from Last 3 Encounters:  09/29/18 167 lb (75.8 kg)  08/22/18 183 lb 2 oz (83.1 kg)  08/11/18 178 lb (80.7 kg)     Objective:    Vital Signs:  Ht 5\' 2"  (1.575 m)   Wt 167 lb (75.8 kg)   BMI 30.54 kg/m    Her Video visit was changed to a telephone visit due to technical difficulties and therefore no PE was done   ASSESSMENT & PLAN:    1.  OSA - the patient is tolerating PAP therapy well without any problems. The PAP download was reviewed today and showed an AHI of 14.4/hr on 11 cm H2O with 80% compliance in using more than 4 hours nightly.  The patient has been using and benefiting from PAP use and will continue to benefit  from therapy.   2.  HTN - she has not required any BP meds.  3.  Obesity - She has changed her diet and started exercising and has lost 20lbs .  I congratulated her and encouraged her to continue her routine exercise program and diet.    4.  COVID-19 Education: The signs and symptoms of COVID-19 were discussed with the patient and how to seek care for testing (follow up with PCP or arrange E-visit).  The importance of social distancing was discussed today.  Patient Risk:   After full review of this patient's clinical status, I feel that they are at least moderate risk at this time.  Time:   Today, I have spent 25 minutes with the patient reviewing chart and discussing medical problems including OSA, HTN and reviewing symptoms of COVID 19 and the ways to protect against contracting the virus with telehealth technology.      Medication Adjustments/Labs and Tests Ordered: Current medicines are reviewed at length with the patient today.  Concerns regarding medicines are outlined above.  Tests Ordered: No orders of the defined types were placed in this encounter.  Medication Changes: No orders of the defined types were placed in this encounter.   Disposition:  Follow up in 1 year(s)  Signed, Armanda Magicraci Isabell Bonafede, MD  09/29/2018 3:26 PM    [facility]

## 2018-10-01 DIAGNOSIS — S52501D Unspecified fracture of the lower end of right radius, subsequent encounter for closed fracture with routine healing: Secondary | ICD-10-CM | POA: Diagnosis not present

## 2018-10-09 ENCOUNTER — Other Ambulatory Visit: Payer: Self-pay | Admitting: Family Medicine

## 2018-10-09 NOTE — Telephone Encounter (Signed)
Requested medication (s) are due for refill today: yes  Requested medication (s) are on the active medication list: yes  Last refill:  07/14/18  Future visit scheduled: no  Notes to clinic:  Medication not delegated to NT   Requested Prescriptions  Pending Prescriptions Disp Refills   HYDROcodone-acetaminophen (NORCO) 10-325 MG tablet 120 tablet 0    Sig: Take 1 tablet by mouth every 6 (six) hours as needed for up to 30 days for moderate pain.     Not Delegated - Analgesics:  Opioid Agonist Combinations Failed - 10/09/2018  8:23 AM      Failed - This refill cannot be delegated      Passed - Urine Drug Screen completed in last 360 days.      Passed - Valid encounter within last 6 months    Recent Outpatient Visits          1 month ago Chronic bilateral low back pain with sciatica, sciatica laterality unspecified   [facility] at [facility], Tera Mater, MD   2 months ago Withdrawal from benzodiazepine, uncomplicated (HCC)   [facility], Tera Mater, MD   2 months ago Chronic narcotic use   [facility], Tera Mater, MD   3 months ago Engineer, maintenance (IT) at [facility], Tera Mater, MD   6 months ago Chronic narcotic use   [facility] at [facility], Tera Mater, MD

## 2018-10-10 ENCOUNTER — Telehealth: Payer: Self-pay | Admitting: *Deleted

## 2018-10-10 NOTE — Telephone Encounter (Signed)
-----   Message from Traci R Turner, MD sent at 09/26/2018  4:41 PM EDT ----- AHI is too high.  Please find out what mask patient is using and if it is leaking.  Please find out if she is sleeping on her back. 

## 2018-10-13 ENCOUNTER — Telehealth: Payer: Self-pay | Admitting: Cardiology

## 2018-10-13 NOTE — Telephone Encounter (Signed)
Patient is calling to check the status of this refill for HYDROcodone-Acetaminophen 10-325 MG

## 2018-10-13 NOTE — Telephone Encounter (Signed)
New Message ° ° °Patient returning your call would like you to call her back. °

## 2018-10-14 NOTE — Telephone Encounter (Signed)
Please advise on refill.

## 2018-10-15 ENCOUNTER — Encounter: Payer: Self-pay | Admitting: Family Medicine

## 2018-10-15 ENCOUNTER — Telehealth: Payer: Self-pay | Admitting: *Deleted

## 2018-10-15 ENCOUNTER — Ambulatory Visit (INDEPENDENT_AMBULATORY_CARE_PROVIDER_SITE_OTHER): Payer: PPO | Admitting: Family Medicine

## 2018-10-15 ENCOUNTER — Other Ambulatory Visit: Payer: Self-pay | Admitting: Family Medicine

## 2018-10-15 ENCOUNTER — Other Ambulatory Visit: Payer: Self-pay

## 2018-10-15 DIAGNOSIS — S52501D Unspecified fracture of the lower end of right radius, subsequent encounter for closed fracture with routine healing: Secondary | ICD-10-CM | POA: Diagnosis not present

## 2018-10-15 DIAGNOSIS — F1190 Opioid use, unspecified, uncomplicated: Secondary | ICD-10-CM | POA: Diagnosis not present

## 2018-10-15 DIAGNOSIS — M5440 Lumbago with sciatica, unspecified side: Secondary | ICD-10-CM

## 2018-10-15 DIAGNOSIS — G8929 Other chronic pain: Secondary | ICD-10-CM | POA: Diagnosis not present

## 2018-10-15 MED ORDER — HYDROCODONE-ACETAMINOPHEN 10-325 MG PO TABS: 1.0000 | ORAL_TABLET | Freq: Four times a day (QID) | ORAL | 0 refills | Status: DC | PRN

## 2018-10-15 MED ORDER — HYDROCODONE-ACETAMINOPHEN 10-325 MG PO TABS: 1.0000 | ORAL_TABLET | Freq: Four times a day (QID) | ORAL | 0 refills | Status: AC | PRN

## 2018-10-15 NOTE — Telephone Encounter (Signed)
Informed patient of compliance results and verbalized understanding was indicated. Patient is aware and agreeable to AHI being elevated at 11.4. Patient states she is using full face mask and she thinks it is leaking but she thinks she did not have it on correctly. Patient does not sleep on her back.

## 2018-10-15 NOTE — Telephone Encounter (Signed)
Returned Call:   Informed patient of compliance results and verbalized understanding was indicated. Patient is aware and agreeable to AHI being elevated at 11.4. Patient states she is using full face mask and she thinks it is leaking but she thinks she did not have it on correctly. Patient does not sleep on her back.

## 2018-10-15 NOTE — Progress Notes (Signed)
Subjective:    Patient ID: [name] [name], female    DOB: 1955-11-27, 63 y.o.   MRN: [id number]  HPI Virtual Visit via Video Note  I connected with the patient on 10/15/18 at  9:30 AM EDT by a video enabled telemedicine application and verified that I am speaking with the correct person using two identifiers.  Location patient: home Location provider:work or home office Persons participating in the virtual visit: patient, provider  I discussed the limitations of evaluation and management by telemedicine and the availability of in person appointments. The patient expressed understanding and agreed to proceed.   HPI: Here for pain management. She is doing well as far as the low back pain goes. Her right wrist is slowly healing, and today she gets to have the cast taken off so she can wear a splint.  Indication for chronic opioid: low back pain Medication and dose: Norco 10-325 # pills per month: 120 Last UDS date: 07-10-18 Opioid Treatment Agreement signed (Y/N): 09-17-17 Opioid Treatment Agreement last reviewed with patient:  10-15-18 NCCSRS reviewed this encounter (include red flags):  10-15-18    ROS: See pertinent positives and negatives per HPI.  Past Medical History:  Diagnosis Date  . Allergy    seasonal  . Bipolar 1 disorder (HCC)   . CPAP (continuous positive airway pressure) dependence   . Depression    [facility]   . GERD (gastroesophageal reflux disease)   . Hyperlipidemia   . Hypertension   . Memory loss    short term  . Meniere disease   . Obesity (BMI 30-39.9) 11/10/2015  . Post concussion syndrome 07/01/2018  . PPD positive 2007 or 2008  . Sleep apnea    on CPAP therapy  . Urinary incontinence   . Vestibular disorder     Past Surgical History:  Procedure Laterality Date  . ABDOMINAL HYSTERECTOMY    . ANTERIOR FUSION CERVICAL SPINE  2006   Dr. Wynetta Emeryram  . BREAST BIOPSY Right 1981   benign  . BREAST EXCISIONAL BIOPSY Right 1981   benign   scar does not show  . BREAST SURGERY Bilateral 12-11-12   reductions [facility] Ivin [facility]Virgil [facility]   . CATARACT EXTRACTION, BILATERAL Bilateral last summer of 2018   [facility]  . COLONOSCOPY  04/25/2016   per Dr. Loreta AveMann, adenomatous polyps, repeat in 5 yrs   . ESOPHAGOGASTRODUODENOSCOPY  04-23-11   per Dr. Loreta AveMann, normal   . EYE SURGERY  06/18/2016   L eyelid   . LUMBAR DISC SURGERY  2003   Dr. Wynetta Emeryram  . REDUCTION MAMMAPLASTY Bilateral 2013  . SPLIT NIGHT STUDY  11/11/2015  . UVULOPALATOPHARYNGOPLASTY    . VAGINAL HYSTERECTOMY  12/96   secondary to fibroids.    Family History  Problem Relation Age of Onset  . Dementia Mother   . Hyperlipidemia Mother   . Asthma Father   . Hypertension Sister   . Hyperlipidemia Sister   . Coronary artery disease Sister   . Thyroid disease Sister   . Hypertension Brother   . Hyperlipidemia Brother   . Coronary artery disease Brother   . Hypertension Sister   . Hyperlipidemia Sister   . Alcohol abuse Other        fhx  . Arthritis Other        fhx  . Coronary artery disease Other        fhx  . Depression Other  fhx  . Diabetes Other        fhx  . Hyperlipidemia Other        fhx  . Hypertension Other        fhx  . Sudden death Other        fhx     Current Outpatient Medications:  .  acyclovir (ZOVIRAX) 400 MG tablet, TAKE 1 TABLET BY MOUTH 4 TIMES A DAY AS NEEDED, Disp: 120 tablet, Rfl: 5 .  alprazolam (XANAX) 2 MG tablet, TAKE 1 TABLET BY MOUTH 3 TIMES A DAY AS NEEDED, Disp: 90 tablet, Rfl: 5 .  azelastine (ASTELIN) 0.1 % nasal spray, Place 2 sprays into both nostrils 2 (two) times daily. Use in each nostril as directed, Disp: 30 mL, Rfl: 2 .  b complex vitamins tablet, Take 1 tablet by mouth every other day. , Disp: , Rfl:  .  CALCIUM-MAGNESIUM-ZINC PO, Take by mouth daily., Disp: , Rfl:  .  Cholecalciferol (VITAMIN D3) 2000 units capsule, Take 2,000 Units by mouth daily., Disp: , Rfl:  .  FLUoxetine  (PROZAC) 10 MG tablet, Take 2 tablets (20 mg total) by mouth daily., Disp: 60 tablet, Rfl: 11 .  fluticasone (FLONASE) 50 MCG/ACT nasal spray, Place 1 spray into both nostrils 2 (two) times daily., Disp: 16 g, Rfl: 1 .  hydrOXYzine (ATARAX/VISTARIL) 50 MG tablet, Take 1 tablet (50 mg total) by mouth at bedtime., Disp: 30 tablet, Rfl: 2 .  meloxicam (MOBIC) 15 MG tablet, Take 1 tablet (15 mg total) by mouth daily., Disp: 30 tablet, Rfl: 2 .  NON FORMULARY, , Disp: , Rfl:  .  omeprazole (PRILOSEC) 20 MG capsule, Take 1 capsule (20 mg total) by mouth 2 (two) times daily before a meal., Disp: 60 capsule, Rfl: 11 .  polyethylene glycol powder (GLYCOLAX/MIRALAX) powder, TAKE 17 GRAMS BY MOUTH 2 TIMES DAILY AS NEEDED., Disp: 3350 g, Rfl: 11 .  potassium chloride (KLOR-CON 10) 10 MEQ tablet, Take 1 tablet (10 mEq total) by mouth daily., Disp: 30 tablet, Rfl: 5 .  Probiotic Product (PROBIOTIC PO), Take by mouth daily., Disp: , Rfl:  .  Saline (SIMPLY SALINE) 0.9 % AERS, Place 1 spray into both nostrils daily as needed., Disp: , Rfl:  .  simvastatin (ZOCOR) 80 MG tablet, Take 1 tablet (80 mg total) by mouth at bedtime., Disp: 90 tablet, Rfl: 3 .  [START ON 12/15/2018] HYDROcodone-acetaminophen (NORCO) 10-325 MG tablet, Take 1 tablet by mouth every 6 (six) hours as needed for up to 30 days for moderate pain., Disp: 120 tablet, Rfl: 0  EXAM:  VITALS per patient if applicable:  GENERAL: alert, oriented, appears well and in no acute distress  HEENT: atraumatic, conjunttiva clear, no obvious abnormalities on inspection of external nose and ears  NECK: normal movements of the head and neck  LUNGS: on inspection no signs of respiratory distress, breathing rate appears normal, no obvious gross SOB, gasping or wheezing  CV: no obvious cyanosis  MS: moves all visible extremities without noticeable abnormality  PSYCH/NEURO: pleasant and cooperative, no obvious depression or anxiety, speech and thought  processing grossly intact  ASSESSMENT AND PLAN: Pain management, meds were refilled.  Gershon Crane, MD  Discussed the following assessment and plan:  No diagnosis found.     I discussed the assessment and treatment plan with the patient. The patient was provided an opportunity to ask questions and all were answered. The patient agreed with the plan and demonstrated an understanding of the instructions.  The patient was advised to call back or seek an in-person evaluation if the symptoms worsen or if the condition fails to improve as anticipated.     Review of Systems     Objective:   Physical Exam        Assessment & Plan:

## 2018-10-15 NOTE — Telephone Encounter (Signed)
-----   Message from [name], MD sent at 09/26/2018  4:41 PM EDT ----- AHI is too high.  Please find out what mask patient is using and if it is leaking.  Please find out if she is sleeping on her back.

## 2018-10-21 ENCOUNTER — Ambulatory Visit: Payer: PPO | Admitting: Neurology

## 2018-11-07 ENCOUNTER — Ambulatory Visit: Payer: Self-pay | Admitting: Neurology

## 2018-11-12 DIAGNOSIS — S52501D Unspecified fracture of the lower end of right radius, subsequent encounter for closed fracture with routine healing: Secondary | ICD-10-CM | POA: Diagnosis not present

## 2018-11-14 ENCOUNTER — Other Ambulatory Visit: Payer: Self-pay | Admitting: Family Medicine

## 2018-11-15 ENCOUNTER — Other Ambulatory Visit: Payer: Self-pay | Admitting: Family Medicine

## 2018-11-17 ENCOUNTER — Other Ambulatory Visit: Payer: Self-pay | Admitting: Family Medicine

## 2018-11-18 ENCOUNTER — Telehealth: Payer: Self-pay | Admitting: Cardiology

## 2018-11-18 DIAGNOSIS — G4733 Obstructive sleep apnea (adult) (pediatric): Secondary | ICD-10-CM

## 2018-11-18 NOTE — Telephone Encounter (Signed)
New Message   Patient spouse is calling in reference to possible receiving a different cpap machine.

## 2018-11-21 NOTE — Telephone Encounter (Signed)
Do I need to amend my OV noted from April

## 2018-11-21 NOTE — Telephone Encounter (Signed)
Patient would like a new prescription for the New Resmed Womans Cpap Machine (for women only). Her old cpap is not working properly.

## 2018-11-25 NOTE — Telephone Encounter (Signed)
No you don't need to amend your note. Patient states the machine cuts off by itself and she wants to get a prescription for the New Womens Only Resmed Cpap. She does not want to get the traditional cpap.

## 2018-11-26 NOTE — Telephone Encounter (Signed)
Please order a Womens only Resmed CPAP at 12cm H2O with heated humitidy and mask of choice

## 2018-12-01 NOTE — Telephone Encounter (Signed)
Patient wanted prescription sent to [facility] instead.

## 2018-12-01 NOTE — Telephone Encounter (Addendum)
Prescription printed, stamped and sealed in envelope and placed at front desk for patient pick up 12/01/18.

## 2018-12-09 ENCOUNTER — Other Ambulatory Visit: Payer: Self-pay | Admitting: Family Medicine

## 2018-12-11 ENCOUNTER — Telehealth: Payer: Self-pay | Admitting: Family Medicine

## 2018-12-11 DIAGNOSIS — R1310 Dysphagia, unspecified: Secondary | ICD-10-CM

## 2018-12-11 NOTE — Telephone Encounter (Signed)
Referral Request - Has patient seen PCP for this complaint? Yes.   *If NO, is insurance requiring patient see PCP for this issue before PCP can refer them? Referral for which specialty: Ear, Nose, Throat Preferred provider/office: Dr. Sarajane Jews Reason for referral: The patient says that it is getting harder and harder to swallow. Please advise. Please leave a message if no answer

## 2018-12-12 NOTE — Telephone Encounter (Signed)
Dr. Fry - Please advise. Thanks! 

## 2018-12-12 NOTE — Telephone Encounter (Signed)
The referral was done  

## 2018-12-13 ENCOUNTER — Other Ambulatory Visit: Payer: Self-pay | Admitting: Family Medicine

## 2018-12-15 ENCOUNTER — Telehealth: Payer: Self-pay | Admitting: Cardiology

## 2018-12-15 DIAGNOSIS — G4733 Obstructive sleep apnea (adult) (pediatric): Secondary | ICD-10-CM

## 2018-12-15 NOTE — Telephone Encounter (Signed)
New message   Patient's husband wants to discuss having an inspire implant. Please call to discuss.

## 2018-12-15 NOTE — Telephone Encounter (Signed)
Patient needs to be the one to request this.  Please call patient to see what she wants to do

## 2018-12-15 NOTE — Telephone Encounter (Signed)
Spoke with he patient's ex-husband, per dpr, he wanted to know if she was a candidate for inspire device. She is sick of CPAP and it is not helping her.

## 2018-12-16 NOTE — Telephone Encounter (Signed)
I am fine with that - refer to Dr. Wilburn Cornelia

## 2018-12-16 NOTE — Telephone Encounter (Signed)
Spoke with the patient, she wanted to be evaluated for inspire device. She had her ex-husband call since she has memory issues.

## 2018-12-16 NOTE — Telephone Encounter (Signed)
Left message to call back  

## 2018-12-17 NOTE — Telephone Encounter (Signed)
Spoke with the patient's ex-husband, he expressed understanding.

## 2018-12-18 NOTE — Telephone Encounter (Addendum)
patient's ex husband call stating Dr. Victorio Palm office needs her records. She has an appt 7/6.

## 2018-12-18 NOTE — Telephone Encounter (Signed)
Spoke with the ex-husband, advised him that we sent the records over.

## 2018-12-19 ENCOUNTER — Encounter (INDEPENDENT_AMBULATORY_CARE_PROVIDER_SITE_OTHER): Payer: PPO | Admitting: Ophthalmology

## 2018-12-24 DIAGNOSIS — S52501D Unspecified fracture of the lower end of right radius, subsequent encounter for closed fracture with routine healing: Secondary | ICD-10-CM | POA: Diagnosis not present

## 2018-12-30 DIAGNOSIS — G4733 Obstructive sleep apnea (adult) (pediatric): Secondary | ICD-10-CM | POA: Diagnosis not present

## 2019-01-06 ENCOUNTER — Telehealth: Payer: PPO | Admitting: Cardiology

## 2019-01-09 ENCOUNTER — Other Ambulatory Visit: Payer: Self-pay | Admitting: Family Medicine

## 2019-01-14 DIAGNOSIS — G4733 Obstructive sleep apnea (adult) (pediatric): Secondary | ICD-10-CM | POA: Diagnosis not present

## 2019-01-20 DIAGNOSIS — R238 Other skin changes: Secondary | ICD-10-CM | POA: Diagnosis not present

## 2019-01-20 DIAGNOSIS — R4781 Slurred speech: Secondary | ICD-10-CM | POA: Diagnosis not present

## 2019-01-20 DIAGNOSIS — N760 Acute vaginitis: Secondary | ICD-10-CM | POA: Diagnosis not present

## 2019-01-20 DIAGNOSIS — N9089 Other specified noninflammatory disorders of vulva and perineum: Secondary | ICD-10-CM | POA: Diagnosis not present

## 2019-01-20 DIAGNOSIS — N898 Other specified noninflammatory disorders of vagina: Secondary | ICD-10-CM | POA: Diagnosis not present

## 2019-01-22 ENCOUNTER — Encounter (HOSPITAL_BASED_OUTPATIENT_CLINIC_OR_DEPARTMENT_OTHER): Payer: Self-pay

## 2019-01-22 DIAGNOSIS — R350 Frequency of micturition: Secondary | ICD-10-CM | POA: Diagnosis not present

## 2019-01-22 DIAGNOSIS — N3946 Mixed incontinence: Secondary | ICD-10-CM | POA: Diagnosis not present

## 2019-01-22 DIAGNOSIS — N3944 Nocturnal enuresis: Secondary | ICD-10-CM | POA: Diagnosis not present

## 2019-01-22 DIAGNOSIS — R351 Nocturia: Secondary | ICD-10-CM | POA: Diagnosis not present

## 2019-01-23 ENCOUNTER — Other Ambulatory Visit: Payer: Self-pay

## 2019-01-23 ENCOUNTER — Encounter (HOSPITAL_BASED_OUTPATIENT_CLINIC_OR_DEPARTMENT_OTHER): Payer: Self-pay

## 2019-01-27 ENCOUNTER — Other Ambulatory Visit (HOSPITAL_COMMUNITY)
Admission: RE | Admit: 2019-01-27 | Discharge: 2019-01-27 | Disposition: A | Discharge status: Home / Self Care | Payer: PPO | Source: Ambulatory Visit | Attending: Otolaryngology | Admitting: Otolaryngology

## 2019-01-27 DIAGNOSIS — R350 Frequency of micturition: Secondary | ICD-10-CM | POA: Diagnosis not present

## 2019-01-27 DIAGNOSIS — Z20828 Contact with and (suspected) exposure to other viral communicable diseases: Secondary | ICD-10-CM | POA: Insufficient documentation

## 2019-01-27 DIAGNOSIS — N3946 Mixed incontinence: Secondary | ICD-10-CM | POA: Diagnosis not present

## 2019-01-27 DIAGNOSIS — Z01812 Encounter for preprocedural laboratory examination: Secondary | ICD-10-CM | POA: Diagnosis not present

## 2019-01-27 DIAGNOSIS — R351 Nocturia: Secondary | ICD-10-CM | POA: Diagnosis not present

## 2019-01-27 DIAGNOSIS — N3944 Nocturnal enuresis: Secondary | ICD-10-CM | POA: Diagnosis not present

## 2019-01-27 LAB — SARS CORONAVIRUS 2 (TAT 6-24 HRS): SARS Coronavirus 2: NEGATIVE

## 2019-01-28 ENCOUNTER — Telehealth (INDEPENDENT_AMBULATORY_CARE_PROVIDER_SITE_OTHER): Payer: PPO | Admitting: Family Medicine

## 2019-01-28 ENCOUNTER — Encounter: Payer: Self-pay | Admitting: Family Medicine

## 2019-01-28 ENCOUNTER — Other Ambulatory Visit: Payer: Self-pay | Admitting: Otolaryngology

## 2019-01-28 ENCOUNTER — Other Ambulatory Visit: Payer: Self-pay

## 2019-01-28 DIAGNOSIS — F1190 Opioid use, unspecified, uncomplicated: Secondary | ICD-10-CM

## 2019-01-28 DIAGNOSIS — G8929 Other chronic pain: Secondary | ICD-10-CM | POA: Diagnosis not present

## 2019-01-28 DIAGNOSIS — M5440 Lumbago with sciatica, unspecified side: Secondary | ICD-10-CM

## 2019-01-28 MED ORDER — HYDROCODONE-ACETAMINOPHEN 10-325 MG PO TABS: 1.0000 | ORAL_TABLET | Freq: Four times a day (QID) | ORAL | 0 refills | Status: DC | PRN

## 2019-01-28 MED ORDER — HYDROCODONE-ACETAMINOPHEN 10-325 MG PO TABS: 1.0000 | ORAL_TABLET | Freq: Four times a day (QID) | ORAL | 0 refills | Status: AC | PRN

## 2019-01-28 NOTE — Progress Notes (Signed)
Virtual Visit via Video Note  I connected with the patient on 01/28/19 at  1:00 PM EDT by a video enabled telemedicine application and verified that I am speaking with the correct person using two identifiers.  Location patient: home Location provider:work or home office Persons participating in the virtual visit: patient, provider  I discussed the limitations of evaluation and management by telemedicine and the availability of in person appointments. The patient expressed understanding and agreed to proceed.   HPI: Here for pain management, she is doing well.  Indication for chronic opioid: low back pain Medication and dose: Norco 10-325 # pills per month: 120 Last UDS date: 07-10-18 Opioid Treatment Agreement signed (Y/N): 09-17-17 Opioid Treatment Agreement last reviewed with patient:  01-28-19 NCCSRS reviewed this encounter (include red flags): Yes    ROS: See pertinent positives and negatives per HPI.  Past Medical History:  Diagnosis Date  . Allergy    seasonal  . Bell's palsy 2008   pt states she had a 'mini stroke' and nervous breakdown which caused facial drooping  . Bipolar 1 disorder ([place])   . CPAP (continuous positive airway pressure) dependence   . Depression    sees the [facility]   . GERD (gastroesophageal reflux disease)   . Hyperlipidemia   . Hypertension    meds no longer needed  . Memory loss    short term  . Meniere disease   . Obesity (BMI 30-39.9) 11/10/2015  . Post concussion syndrome 07/01/2018  . PPD positive 2007 or 2008  . Sleep apnea    on CPAP therapy  . Urinary incontinence   . Vestibular disorder     Past Surgical History:  Procedure Laterality Date  . ABDOMINAL HYSTERECTOMY    . ANTERIOR FUSION CERVICAL SPINE  2006   Dr. Saintclair Halsted  . BREAST BIOPSY Right 1981   benign  . BREAST EXCISIONAL BIOPSY Right 1981   benign   scar does not show  . BREAST SURGERY Bilateral 12-11-12   reductions per Dr. Julious Oka in [facility]   .  CATARACT EXTRACTION, BILATERAL Bilateral last summer of 2018   at [facility]  . COLONOSCOPY  04/25/2016   per Dr. Collene Mares, adenomatous polyps, repeat in 5 yrs   . ESOPHAGOGASTRODUODENOSCOPY  04-23-11   per Dr. Collene Mares, normal   . EYE SURGERY  06/18/2016   L eyelid   . LUMBAR DISC SURGERY  2003   Dr. Saintclair Halsted  . ORIF WRIST FRACTURE Right 08/29/2018  . REDUCTION MAMMAPLASTY Bilateral 2013  . SPLIT NIGHT STUDY  11/11/2015  . UVULOPALATOPHARYNGOPLASTY    . VAGINAL HYSTERECTOMY  12/96   secondary to fibroids.    Family History  Problem Relation Age of Onset  . Dementia Mother   . Hyperlipidemia Mother   . Asthma Father   . Hypertension Sister   . Hyperlipidemia Sister   . Coronary artery disease Sister   . Thyroid disease Sister   . Hypertension Brother   . Hyperlipidemia Brother   . Coronary artery disease Brother   . Hypertension Sister   . Hyperlipidemia Sister   . Alcohol abuse Other        fhx  . Arthritis Other        fhx  . Coronary artery disease Other        fhx  . Depression Other        fhx  . Diabetes Other        fhx  . Hyperlipidemia Other  fhx  . Hypertension Other        fhx  . Sudden death Other        fhx     Current Outpatient Medications:  .  alprazolam (XANAX) 2 MG tablet, TAKE 1 TABLET BY MOUTH 3 TIMES A DAY AS NEEDED, Disp: 90 tablet, Rfl: 5 .  azelastine (ASTELIN) 0.1 % nasal spray, Place 2 sprays into both nostrils 2 (two) times daily. Use in each nostril as directed, Disp: 30 mL, Rfl: 2 .  b complex vitamins tablet, Take 1 tablet by mouth every other day. , Disp: , Rfl:  .  Cholecalciferol (VITAMIN D3) 2000 units capsule, Take 2,000 Units by mouth daily., Disp: , Rfl:  .  FLUoxetine (PROZAC) 10 MG tablet, Take 2 tablets (20 mg total) by mouth daily., Disp: 60 tablet, Rfl: 11 .  fluticasone (FLONASE) 50 MCG/ACT nasal spray, Place 1 spray into both nostrils 2 (two) times daily., Disp: 16 g, Rfl: 1 .  [START ON 03/30/2019]  HYDROcodone-acetaminophen (NORCO) 10-325 MG tablet, Take 1 tablet by mouth every 6 (six) hours as needed for moderate pain., Disp: 120 tablet, Rfl: 0 .  hydrOXYzine (ATARAX/VISTARIL) 50 MG tablet, TAKE 1 TABLET BY MOUTH AT BEDTIME., Disp: 30 tablet, Rfl: 2 .  meloxicam (MOBIC) 15 MG tablet, TAKE 1 TABLET BY MOUTH DAILY., Disp: 30 tablet, Rfl: 2 .  omeprazole (PRILOSEC) 20 MG capsule, Take 1 capsule (20 mg total) by mouth 2 (two) times daily before a meal., Disp: 60 capsule, Rfl: 11 .  polyethylene glycol powder (GLYCOLAX/MIRALAX) powder, TAKE 17 GRAMS BY MOUTH 2 TIMES DAILY AS NEEDED., Disp: 3350 g, Rfl: 11 .  potassium chloride (K-DUR) 10 MEQ tablet, TAKE 1 TABLET BY MOUTH DAILY., Disp: 30 tablet, Rfl: 2 .  Probiotic Product (PROBIOTIC PO), Take by mouth daily., Disp: , Rfl:  .  Saline (SIMPLY SALINE) 0.9 % AERS, Place 1 spray into both nostrils daily as needed., Disp: , Rfl:  .  simvastatin (ZOCOR) 80 MG tablet, TAKE 1 TABLET BY MOUTH AT BEDTIME., Disp: 90 tablet, Rfl: 3 .  valACYclovir (VALTREX) 1000 MG tablet, Take 1,000 mg by mouth 2 (two) times daily., Disp: , Rfl:   EXAM:  VITALS per patient if applicable:  GENERAL: alert, oriented, appears well and in no acute distress  HEENT: atraumatic, conjunttiva clear, no obvious abnormalities on inspection of external nose and ears  NECK: normal movements of the head and neck  LUNGS: on inspection no signs of respiratory distress, breathing rate appears normal, no obvious gross SOB, gasping or wheezing  CV: no obvious cyanosis  MS: moves all visible extremities without noticeable abnormality  PSYCH/NEURO: pleasant and cooperative, no obvious depression or anxiety, speech and thought processing grossly intact  Discussed the following assessment and plan: Pain management, meds were refilled.  Gershon CraneStephen Jimmylee Ratterree, MD  No diagnosis found.     I discussed the assessment and treatment plan with the patient. The patient was provided an opportunity  to ask questions and all were answered. The patient agreed with the plan and demonstrated an understanding of the instructions.   The patient was advised to call back or seek an in-person evaluation if the symptoms worsen or if the condition fails to improve as anticipated.

## 2019-01-29 NOTE — Anesthesia Preprocedure Evaluation (Addendum)
Anesthesia Evaluation  Patient identified by MRN, date of birth, ID band Patient awake    Reviewed: Allergy & Precautions, H&P , NPO status , Patient's Chart, lab work & pertinent test results  Airway Mallampati: III  TM Distance: >3 FB Neck ROM: Full   Comment: VERY SMALL MOUTH Dental no notable dental hx. (+) Edentulous Upper, Edentulous Lower   Pulmonary neg pulmonary ROS, sleep apnea and Continuous Positive Airway Pressure Ventilation , Current Smoker and Patient abstained from smoking.,    Pulmonary exam normal breath sounds clear to auscultation       Cardiovascular Exercise Tolerance: Good hypertension, Pt. on medications negative cardio ROS Normal cardiovascular exam Rhythm:Regular Rate:Normal     Neuro/Psych  Headaches, PSYCHIATRIC DISORDERS Anxiety Depression Bipolar Disorder Dementia  Neuromuscular disease CVA negative neurological ROS  negative psych ROS   GI/Hepatic negative GI ROS, Neg liver ROS, PUD, GERD  ,  Endo/Other  negative endocrine ROSMorbid obesity  Renal/GU negative Renal ROS  negative genitourinary   Musculoskeletal negative musculoskeletal ROS (+)   Abdominal   Peds negative pediatric ROS (+)  Hematology negative hematology ROS (+)   Anesthesia Other Findings Day of surgery medications reviewed with the patient.  Reproductive/Obstetrics negative OB ROS                            Anesthesia Physical Anesthesia Plan  ASA: III  Anesthesia Plan: General   Post-op Pain Management:    Induction:   PONV Risk Score and Plan: 2 and Ondansetron and Treatment may vary due to age or medical condition  Airway Management Planned: Simple Face Mask  Additional Equipment:   Intra-op Plan:   Post-operative Plan:   Informed Consent: I have reviewed the patients History and Physical, chart, labs and discussed the procedure including the risks, benefits and alternatives  for the proposed anesthesia with the patient or authorized representative who has indicated his/her understanding and acceptance.       Plan Discussed with: CRNA, Anesthesiologist and Surgeon  Anesthesia Plan Comments:         Anesthesia Quick Evaluation

## 2019-01-30 ENCOUNTER — Encounter (HOSPITAL_BASED_OUTPATIENT_CLINIC_OR_DEPARTMENT_OTHER)
Admission: RE | Disposition: A | Discharge status: Home / Self Care | Payer: Self-pay | Source: Home / Self Care | Attending: Otolaryngology

## 2019-01-30 ENCOUNTER — Other Ambulatory Visit: Payer: Self-pay

## 2019-01-30 ENCOUNTER — Ambulatory Visit (HOSPITAL_BASED_OUTPATIENT_CLINIC_OR_DEPARTMENT_OTHER)
Admission: RE | Admit: 2019-01-30 | Discharge: 2019-01-30 | Disposition: A | Discharge status: Home / Self Care | Payer: PPO | Attending: Otolaryngology | Admitting: Otolaryngology

## 2019-01-30 ENCOUNTER — Ambulatory Visit (HOSPITAL_BASED_OUTPATIENT_CLINIC_OR_DEPARTMENT_OTHER): Payer: PPO | Admitting: Anesthesiology

## 2019-01-30 ENCOUNTER — Encounter (HOSPITAL_BASED_OUTPATIENT_CLINIC_OR_DEPARTMENT_OTHER): Payer: Self-pay | Admitting: Certified Registered"

## 2019-01-30 DIAGNOSIS — F419 Anxiety disorder, unspecified: Secondary | ICD-10-CM | POA: Insufficient documentation

## 2019-01-30 DIAGNOSIS — Z79899 Other long term (current) drug therapy: Secondary | ICD-10-CM | POA: Diagnosis not present

## 2019-01-30 DIAGNOSIS — Z882 Allergy status to sulfonamides status: Secondary | ICD-10-CM | POA: Diagnosis not present

## 2019-01-30 DIAGNOSIS — G4733 Obstructive sleep apnea (adult) (pediatric): Secondary | ICD-10-CM | POA: Insufficient documentation

## 2019-01-30 DIAGNOSIS — E785 Hyperlipidemia, unspecified: Secondary | ICD-10-CM | POA: Diagnosis not present

## 2019-01-30 DIAGNOSIS — I10 Essential (primary) hypertension: Secondary | ICD-10-CM | POA: Insufficient documentation

## 2019-01-30 DIAGNOSIS — Z6831 Body mass index (BMI) 31.0-31.9, adult: Secondary | ICD-10-CM | POA: Diagnosis not present

## 2019-01-30 DIAGNOSIS — K219 Gastro-esophageal reflux disease without esophagitis: Secondary | ICD-10-CM | POA: Insufficient documentation

## 2019-01-30 DIAGNOSIS — F319 Bipolar disorder, unspecified: Secondary | ICD-10-CM | POA: Insufficient documentation

## 2019-01-30 DIAGNOSIS — Z791 Long term (current) use of non-steroidal anti-inflammatories (NSAID): Secondary | ICD-10-CM | POA: Insufficient documentation

## 2019-01-30 DIAGNOSIS — F17210 Nicotine dependence, cigarettes, uncomplicated: Secondary | ICD-10-CM | POA: Diagnosis not present

## 2019-01-30 DIAGNOSIS — F418 Other specified anxiety disorders: Secondary | ICD-10-CM | POA: Diagnosis not present

## 2019-01-30 DIAGNOSIS — Z885 Allergy status to narcotic agent status: Secondary | ICD-10-CM | POA: Insufficient documentation

## 2019-01-30 HISTORY — PX: DRUG INDUCED ENDOSCOPY: SHX6808

## 2019-01-30 SURGERY — DRUG INDUCED SLEEP ENDOSCOPY
Anesthesia: General | Site: Nose

## 2019-01-30 MED ORDER — ACETAMINOPHEN 325 MG PO TABS: 325.0000 mg | ORAL_TABLET | ORAL | Status: DC | PRN

## 2019-01-30 MED ORDER — OXYCODONE HCL 5 MG/5ML PO SOLN: 5.0000 mg | Freq: Once | ORAL | Status: DC | PRN

## 2019-01-30 MED ORDER — ACETAMINOPHEN 160 MG/5ML PO SOLN: 325.0000 mg | ORAL | Status: DC | PRN

## 2019-01-30 MED ORDER — OXYMETAZOLINE HCL 0.05 % NA SOLN
NASAL | Status: DC | PRN
  Administered 2019-01-30: 1 via TOPICAL

## 2019-01-30 MED ORDER — OXYCODONE HCL 5 MG PO TABS: 5.0000 mg | ORAL_TABLET | Freq: Once | ORAL | Status: DC | PRN

## 2019-01-30 MED ORDER — MEPERIDINE HCL 25 MG/ML IJ SOLN: 6.2500 mg | INTRAMUSCULAR | Status: DC | PRN

## 2019-01-30 MED ORDER — LIDOCAINE-EPINEPHRINE 1 %-1:100000 IJ SOLN
INTRAMUSCULAR | Status: AC
  Filled 2019-01-30: qty 1

## 2019-01-30 MED ORDER — ONDANSETRON HCL 4 MG/2ML IJ SOLN: 4.0000 mg | Freq: Once | INTRAMUSCULAR | Status: DC | PRN

## 2019-01-30 MED ORDER — PROPOFOL 500 MG/50ML IV EMUL
INTRAVENOUS | Status: DC | PRN
  Administered 2019-01-30: 35 ug/kg/min via INTRAVENOUS

## 2019-01-30 MED ORDER — FENTANYL CITRATE (PF) 100 MCG/2ML IJ SOLN: 25.0000 ug | INTRAMUSCULAR | Status: DC | PRN

## 2019-01-30 MED ORDER — LACTATED RINGERS IV SOLN
INTRAVENOUS | Status: DC
  Administered 2019-01-30: 07:00:00 via INTRAVENOUS

## 2019-01-30 MED ORDER — OXYMETAZOLINE HCL 0.05 % NA SOLN
NASAL | Status: AC
  Filled 2019-01-30: qty 60

## 2019-01-30 MED ORDER — ONDANSETRON HCL 4 MG/2ML IJ SOLN
INTRAMUSCULAR | Status: DC | PRN
  Administered 2019-01-30: 4 mg via INTRAVENOUS

## 2019-01-30 MED ORDER — LIDOCAINE HCL (CARDIAC) PF 100 MG/5ML IV SOSY
PREFILLED_SYRINGE | INTRAVENOUS | Status: DC | PRN
  Administered 2019-01-30: 30 mg via INTRAVENOUS

## 2019-01-30 SURGICAL SUPPLY — 16 items
CANISTER SUCT 1200ML W/VALVE (MISCELLANEOUS) ×2 IMPLANT
COVER WAND RF STERILE (DRAPES) IMPLANT
DRAPE HALF SHEET 70X43 (DRAPES) IMPLANT
GLOVE BIO SURGEON STRL SZ7.5 (GLOVE) ×2 IMPLANT
GLOVE BIOGEL PI IND STRL 7.0 (GLOVE) IMPLANT
GLOVE BIOGEL PI INDICATOR 7.0 (GLOVE) ×2
GLOVE ECLIPSE 6.5 STRL STRAW (GLOVE) ×1 IMPLANT
KIT CLEAN ENDO (MISCELLANEOUS) ×2 IMPLANT
NDL PRECISIONGLIDE 27X1.5 (NEEDLE) IMPLANT
NEEDLE PRECISIONGLIDE 27X1.5 (NEEDLE) IMPLANT
PACK BASIN DAY SURGERY FS (CUSTOM PROCEDURE TRAY) ×2 IMPLANT
PATTIES SURGICAL .5 X3 (DISPOSABLE) ×2 IMPLANT
SOLUTION ANTI FOG 6CC (MISCELLANEOUS) ×2 IMPLANT
SYR CONTROL 10ML LL (SYRINGE) IMPLANT
TOWEL GREEN STERILE FF (TOWEL DISPOSABLE) ×2 IMPLANT
TUBE CONNECTING 20X1/4 (TUBING) ×2 IMPLANT

## 2019-01-30 NOTE — Brief Op Note (Signed)
01/30/2019  7:53 AM  PATIENT:  [name] [name]  64 y.o. female  PRE-OPERATIVE DIAGNOSIS:  obstrutive sleep apnea  POST-OPERATIVE DIAGNOSIS:  obstrutive sleep apnea  PROCEDURE:  Procedure(s): DRUG INDUCED ENDOSCOPY (N/A)  SURGEON:  Surgeon(s) and Role:    Melida Quitter, MD - Primary  PHYSICIAN ASSISTANT:   ASSISTANTS: none   ANESTHESIA:   IV sedation  EBL: None  BLOOD ADMINISTERED:none  DRAINS: none   LOCAL MEDICATIONS USED:  NONE  SPECIMEN:  No Specimen  DISPOSITION OF SPECIMEN:  N/A  COUNTS:  YES  TOURNIQUET:  * No tourniquets in log *  DICTATION: .Other Dictation: Dictation Number [id number]  PLAN OF CARE: Discharge to home after PACU  PATIENT DISPOSITION:  PACU - hemodynamically stable.   Delay start of Pharmacological VTE agent (>24hrs) due to surgical blood loss or risk of bleeding: no

## 2019-01-30 NOTE — Anesthesia Procedure Notes (Signed)
Procedure Name: MAC Date/Time: 01/30/2019 7:34 AM Performed by: Signe Colt, CRNA Pre-anesthesia Checklist: Patient identified, Emergency Drugs available, Suction available, Patient being monitored and Timeout performed Patient Re-evaluated:Patient Re-evaluated prior to induction Oxygen Delivery Method: Simple face mask

## 2019-01-30 NOTE — H&P (Signed)
[name] [name] is an 63 y.o. female.   Chief Complaint: Obstructive sleep apnea HPI: 63 year old female with severe obstructive sleep apnea not tolerating CPAP well.  She presents for sleep endoscopy.  Past Medical History:  Diagnosis Date  . Allergy    seasonal  . Bell's palsy 2008   pt states she had a 'mini stroke' and nervous breakdown which caused facial drooping  . Bipolar 1 disorder ([place])   . CPAP (continuous positive airway pressure) dependence   . Depression    sees the [facility]   . GERD (gastroesophageal reflux disease)   . Hyperlipidemia   . Hypertension    meds no longer needed  . Memory loss    short term  . Meniere disease   . Obesity (BMI 30-39.9) 11/10/2015  . Post concussion syndrome 07/01/2018  . PPD positive 2007 or 2008  . Sleep apnea    on CPAP therapy  . Urinary incontinence   . Vestibular disorder     Past Surgical History:  Procedure Laterality Date  . ABDOMINAL HYSTERECTOMY    . ANTERIOR FUSION CERVICAL SPINE  2006   Dr. Saintclair Halsted  . BREAST BIOPSY Right 1981   benign  . BREAST EXCISIONAL BIOPSY Right 1981   benign   scar does not show  . BREAST SURGERY Bilateral 12-11-12   reductions [facility] Dr. Julious [facility]ka in [facility]   . CATARACT EXTRACTION, BILATERAL Bilateral last summer of 2018   at [facility]  . COLONOSCOPY  04/25/2016   per Dr. Collene Mares, adenomatous polyps, repeat in 5 yrs   . ESOPHAGOGASTRODUODENOSCOPY  04-23-11   per Dr. Collene Mares, normal   . EYE SURGERY  06/18/2016   L eyelid   . LUMBAR DISC SURGERY  2003   Dr. Saintclair Halsted  . ORIF WRIST FRACTURE Right 08/29/2018  . REDUCTION MAMMAPLASTY Bilateral 2013  . SPLIT NIGHT STUDY  11/11/2015  . UVULOPALATOPHARYNGOPLASTY    . VAGINAL HYSTERECTOMY  12/96   secondary to fibroids.    Family History  Problem Relation Age of Onset  . Dementia Mother   . Hyperlipidemia Mother   . Asthma Father   . Hypertension Sister   . Hyperlipidemia Sister   . Coronary artery disease Sister   . Thyroid  disease Sister   . Hypertension Brother   . Hyperlipidemia Brother   . Coronary artery disease Brother   . Hypertension Sister   . Hyperlipidemia Sister   . Alcohol abuse Other        fhx  . Arthritis Other        fhx  . Coronary artery disease Other        fhx  . Depression Other        fhx  . Diabetes Other        fhx  . Hyperlipidemia Other        fhx  . Hypertension Other        fhx  . Sudden death Other        fhx   Social History:  reports that she has been smoking cigarettes. She has smoked for the past 30.00 years. She has never used smokeless tobacco. She reports that she does not drink alcohol or use drugs.  Allergies:  Allergies  Allergen Reactions  . Sulfonamide Derivatives Other (See Comments)    Facial swelling  . Flexeril [Cyclobenzaprine] Other (See Comments)    Dizziness, made meniere's disease worse   . Morphine And Related Nausea And Vomiting  .  Sulfa Antibiotics Other (See Comments)  . Sulfamethoxazole Other (See Comments)    Causes swelling     Medications Prior to Admission  Medication Sig Dispense Refill  . alprazolam (XANAX) 2 MG tablet TAKE 1 TABLET BY MOUTH 3 TIMES A DAY AS NEEDED 90 tablet 5  . azelastine (ASTELIN) 0.1 % nasal spray Place 2 sprays into both nostrils 2 (two) times daily. Use in each nostril as directed 30 mL 2  . b complex vitamins tablet Take 1 tablet by mouth every other day.     . Cholecalciferol (VITAMIN D3) 2000 units capsule Take 2,000 Units by mouth daily.    [name]. FLUoxetine (PROZAC) 10 MG tablet Take 2 tablets (20 mg total) by mouth daily. 60 tablet 11  . fluticasone (FLONASE) 50 MCG/ACT nasal spray Place 1 spray into both nostrils 2 (two) times daily. 16 g 1  . [START ON 03/30/2019] HYDROcodone-acetaminophen (NORCO) 10-325 MG tablet Take 1 tablet by mouth every 6 (six) hours as needed for moderate pain. 120 tablet 0  . hydrOXYzine (ATARAX/VISTARIL) 50 MG tablet TAKE 1 TABLET BY MOUTH AT BEDTIME. 30 tablet 2  . meloxicam  (MOBIC) 15 MG tablet TAKE 1 TABLET BY MOUTH DAILY. 30 tablet 2  . omeprazole (PRILOSEC) 20 MG capsule Take 1 capsule (20 mg total) by mouth 2 (two) times daily before a meal. 60 capsule 11  . polyethylene glycol powder (GLYCOLAX/MIRALAX) powder TAKE 17 GRAMS BY MOUTH 2 TIMES DAILY AS NEEDED. 3350 g 11  . potassium chloride (K-DUR) 10 MEQ tablet TAKE 1 TABLET BY MOUTH DAILY. 30 tablet 2  . Probiotic Product (PROBIOTIC PO) Take by mouth daily.    . Saline (SIMPLY SALINE) 0.9 % AERS Place 1 spray into both nostrils daily as needed.    . simvastatin (ZOCOR) 80 MG tablet TAKE 1 TABLET BY MOUTH AT BEDTIME. 90 tablet 3  . valACYclovir (VALTREX) 1000 MG tablet Take 1,000 mg by mouth 2 (two) times daily.      No results found for this or any previous visit (from the past 48 hour(s)). No results found.  Review of Systems  HENT: Positive for congestion.   All other systems reviewed and are negative.   Blood pressure 126/85, pulse 68, temperature 98.5 F (36.9 C), temperature source Oral, resp. rate 18, height 5\' 3"  (1.6 m), weight 81.6 kg, SpO2 100 %. Physical Exam  Constitutional: She is oriented to person, place, and time. She appears well-developed and well-nourished. No distress.  HENT:  Head: Normocephalic and atraumatic.  Right Ear: External ear normal.  Left Ear: External ear normal.  Nose: Nose normal.  Mouth/Throat: Oropharynx is clear and moist.  Eyes: Pupils are equal, round, and reactive to light. Conjunctivae and EOM are normal.  Neck: Normal range of motion. Neck supple.  Cardiovascular: Normal rate.  Respiratory: Effort normal.  Neurological: She is alert and oriented to person, place, and time. No cranial nerve deficit.  Skin: Skin is warm and dry.  Psychiatric: She has a normal mood and affect. Her behavior is normal. Judgment and thought content normal.     Assessment/Plan OSA  To OR for DISE.  Christia Readingwight Jasani Dolney, MD 01/30/2019, 7:23 AM

## 2019-01-30 NOTE — Transfer of Care (Signed)
Immediate Anesthesia Transfer of Care Note  Patient: [name]  Procedure(s) Performed: DRUG INDUCED ENDOSCOPY (N/A Nose)  Patient Location: PACU  Anesthesia Type:MAC  Level of Consciousness: awake, alert , oriented and patient cooperative  Airway & Oxygen Therapy: Patient Spontanous Breathing  Post-op Assessment: Report given to RN and Post -op Vital signs reviewed and stable  Post vital signs: Reviewed and stable  Last Vitals:  Vitals Value Taken Time  BP    Temp    Pulse 76 01/30/19 0753  Resp    SpO2 98 % 01/30/19 0753  Vitals shown include unvalidated device data.  Last Pain:  Vitals:   01/30/19 0639  TempSrc: Oral  PainSc: 3       Patients Stated Pain Goal: 3 (58/68/25 7493)  Complications: No apparent anesthesia complications

## 2019-01-30 NOTE — Op Note (Signed)
NAME: [name] [name], [name] [name] MEDICAL RECORD OA:[id number] ACCOUNT [id number] DATE OF BIRTH:Jun 17, 1956 FACILITY: [facility] LOCATION: [facility] PHYSICIAN:Camber Ninh Guido Sander, MD  OPERATIVE REPORT  DATE OF PROCEDURE:  01/30/2019  PREOPERATIVE DIAGNOSIS:  Obstructive sleep apnea.  POSTOPERATIVE DIAGNOSIS:  Obstructive sleep apnea.  PROCEDURE:  Drug-induced sleep endoscopy.  SURGEON:  Melida Quitter, MD  ANESTHESIA:  IV sedation.  COMPLICATIONS:  None.  INDICATIONS:  The patient is a 63 year old female with obstructive sleep apnea who has had difficulty tolerating CPAP.  She presents to the operating room for sleep endoscopy.  FINDINGS:  At the velum, there was nearly complete anterior-posterior collapse with very little sidewall collapse.  This makes her a good candidate for hypoglossal nerve stimulation placement.  The larynx was in a fairly neutral position, as was the  tongue base.  DESCRIPTION OF PROCEDURE:  The patient was identified in the holding room, informed consent having been obtained including discussion of risks, benefits, and alternatives.  The patient was brought to the operative suite and put on the operative table in  supine position.  IV sedation was induced, and she was gradually advanced to a simulated sleep level.  An Afrin pledget was placed in the right nasal passage for a couple of minutes and then removed.  The flexible laryngoscope was then passed through the  right nasal passage to view the nasopharynx, oropharynx, and hypopharynx.  Findings were noted above.  After this was completed, the scope was removed.  The patient was returned to anesthesia for wakeup and was moved to recovery room in stable  condition.  LN/NUANCE  D:01/30/2019 T:01/30/2019 JOB:[id number]

## 2019-01-30 NOTE — Anesthesia Postprocedure Evaluation (Signed)
Anesthesia Post Note  Patient: Shauntavia Brackin  Procedure(s) Performed: DRUG INDUCED ENDOSCOPY (N/A Nose)     Patient location during evaluation: PACU Anesthesia Type: General Level of consciousness: awake and alert Pain management: pain level controlled Vital Signs Assessment: post-procedure vital signs reviewed and stable Respiratory status: spontaneous breathing, nonlabored ventilation, respiratory function stable and patient connected to nasal cannula oxygen Cardiovascular status: blood pressure returned to baseline and stable Postop Assessment: no apparent nausea or vomiting Anesthetic complications: no    Last Vitals:  Vitals:   01/30/19 0800 01/30/19 0814  BP: (!) 148/81   Pulse: 67 66  Resp: 13 13  Temp:    SpO2: 100% 100%    Last Pain:  Vitals:   01/30/19 0818  TempSrc:   PainSc: 0-No pain                 June Vacha

## 2019-01-30 NOTE — Discharge Instructions (Signed)

## 2019-02-02 ENCOUNTER — Encounter (HOSPITAL_BASED_OUTPATIENT_CLINIC_OR_DEPARTMENT_OTHER): Payer: Self-pay | Admitting: Otolaryngology

## 2019-02-04 DIAGNOSIS — S52501D Unspecified fracture of the lower end of right radius, subsequent encounter for closed fracture with routine healing: Secondary | ICD-10-CM | POA: Diagnosis not present

## 2019-02-06 ENCOUNTER — Other Ambulatory Visit: Payer: Self-pay | Admitting: Family Medicine

## 2019-02-06 NOTE — Telephone Encounter (Signed)
Last filled 08/20/2018 Last OV 01/28/2019  Ok to fill?

## 2019-02-07 ENCOUNTER — Other Ambulatory Visit: Payer: Self-pay | Admitting: Family Medicine

## 2019-02-11 ENCOUNTER — Other Ambulatory Visit: Payer: Self-pay | Admitting: Family Medicine

## 2019-02-11 ENCOUNTER — Telehealth: Payer: Self-pay | Admitting: *Deleted

## 2019-02-11 DIAGNOSIS — M810 Age-related osteoporosis without current pathological fracture: Secondary | ICD-10-CM

## 2019-02-11 NOTE — Telephone Encounter (Signed)
Spoke with Marco Collie, MD from [facility]. Per Dr. Nyra Capes patient needs a bone density test or a rx for osteoporosis (fosamax is the quickest and easiest, then the patient could be transitioned to something else if the provider prefers--however, [facility] does not have any prior auth requirement for Prolia, so that is also fairly easy, though it will be the highest Tier copay.) We have a service ([employer])that will bring a portable dexa machine to the patient's home FREE of COST to eliminate any scheduling headaches for your office in trying to get this done in such a short timeframe.  This needs to be completed by 02/27/2019 COB.

## 2019-02-12 NOTE — Telephone Encounter (Signed)
I put in the order for the DEXA. Please arrange for [facility] to send a mobile scanner to the patient's home asap

## 2019-02-12 NOTE — Telephone Encounter (Signed)
Please call patient's care giver Jenny Reichmann [phone] with appointment date and time

## 2019-02-17 ENCOUNTER — Telehealth: Payer: Self-pay

## 2019-02-17 NOTE — Telephone Encounter (Signed)
Please advise. The patient has an appointment for her mammogram in January. She would like to do her Dexa scan at the same time because she is in the middle of moving and does not really want them coming out to her house.

## 2019-02-17 NOTE — Telephone Encounter (Signed)
Copied from [place] [id number]. Topic: General - Other >> Feb 17, 2019 10:57 AM Rainey Pines A wrote: Patient would like a callback from Dr. Sarajane Jews nurse In regards to the dexa exam being conducted in her home.  Patient is currentlyin the process of moving.

## 2019-02-18 DIAGNOSIS — N3946 Mixed incontinence: Secondary | ICD-10-CM | POA: Diagnosis not present

## 2019-02-19 DIAGNOSIS — Z9181 History of falling: Secondary | ICD-10-CM | POA: Diagnosis not present

## 2019-02-19 DIAGNOSIS — S6291XD Unspecified fracture of right wrist and hand, subsequent encounter for fracture with routine healing: Secondary | ICD-10-CM | POA: Diagnosis not present

## 2019-02-19 NOTE — Telephone Encounter (Signed)
Please ask her to go ahead and get this done ASAP because I am worried  about her bones and also this is a one time opportunity to get the test for FREE (no copay)

## 2019-02-19 NOTE — Telephone Encounter (Signed)
Pt returned call. Advised pt of advise from provider. Pt says that she will go ahead and have scan per her PCP.   Pt expressed understanding.

## 2019-02-19 NOTE — Telephone Encounter (Signed)
Left message for patient to call back. CRM created 

## 2019-02-20 ENCOUNTER — Other Ambulatory Visit: Payer: Self-pay | Admitting: Family Medicine

## 2019-02-20 DIAGNOSIS — Z1231 Encounter for screening mammogram for malignant neoplasm of breast: Secondary | ICD-10-CM

## 2019-03-11 DIAGNOSIS — Z9013 Acquired absence of bilateral breasts and nipples: Secondary | ICD-10-CM | POA: Diagnosis not present

## 2019-03-12 ENCOUNTER — Other Ambulatory Visit: Payer: Self-pay | Admitting: Family Medicine

## 2019-03-23 ENCOUNTER — Other Ambulatory Visit: Payer: Self-pay

## 2019-03-23 ENCOUNTER — Other Ambulatory Visit (HOSPITAL_COMMUNITY)
Admission: RE | Admit: 2019-03-23 | Discharge: 2019-03-23 | Disposition: A | Discharge status: Home / Self Care | Payer: PPO | Source: Ambulatory Visit | Attending: Otolaryngology | Admitting: Otolaryngology

## 2019-03-23 ENCOUNTER — Encounter (HOSPITAL_COMMUNITY)
Admission: RE | Admit: 2019-03-23 | Discharge: 2019-03-23 | Disposition: A | Discharge status: Home / Self Care | Payer: PPO | Source: Ambulatory Visit | Attending: Otolaryngology | Admitting: Otolaryngology

## 2019-03-23 ENCOUNTER — Encounter (HOSPITAL_COMMUNITY): Payer: Self-pay

## 2019-03-23 DIAGNOSIS — G4733 Obstructive sleep apnea (adult) (pediatric): Secondary | ICD-10-CM | POA: Insufficient documentation

## 2019-03-23 DIAGNOSIS — Z01818 Encounter for other preprocedural examination: Secondary | ICD-10-CM | POA: Insufficient documentation

## 2019-03-23 DIAGNOSIS — Z20828 Contact with and (suspected) exposure to other viral communicable diseases: Secondary | ICD-10-CM | POA: Diagnosis not present

## 2019-03-23 HISTORY — DX: Presence of spectacles and contact lenses: Z97.3

## 2019-03-23 HISTORY — DX: Unspecified osteoarthritis, unspecified site: M19.90

## 2019-03-23 HISTORY — DX: Presence of dental prosthetic device (complete) (partial): Z97.2

## 2019-03-23 HISTORY — DX: Anxiety disorder, unspecified: F41.9

## 2019-03-23 LAB — BASIC METABOLIC PANEL
Anion gap: 9 (ref 5–15)
BUN: 9 mg/dL (ref 8–23)
CO2: 27 mmol/L (ref 22–32)
Calcium: 9.6 mg/dL (ref 8.9–10.3)
Chloride: 102 mmol/L (ref 98–111)
Creatinine, Ser: 0.76 mg/dL (ref 0.44–1.00)
GFR calc Af Amer: >60 mL/min (ref >60)
GFR calc non Af Amer: >60 mL/min (ref >60)
Glucose, Bld: 92 mg/dL (ref 70–99)
Potassium: 4.2 mmol/L (ref 3.5–5.1)
Sodium: 138 mmol/L (ref 135–145)

## 2019-03-23 LAB — CBC
HCT: 37.2 % (ref 36.0–46.0)
Hemoglobin: 12.6 g/dL (ref 12.0–15.0)
MCH: 31.7 pg (ref 26.0–34.0)
MCHC: 33.9 g/dL (ref 30.0–36.0)
MCV: 93.5 fL (ref 80.0–100.0)
Platelets: 157 10*3/uL (ref 150–400)
RBC: 3.98 MIL/uL (ref 3.87–5.11)
RDW: 13.1 % (ref 11.5–15.5)
WBC: 6 10*3/uL (ref 4.0–10.5)
nRBC: 0 % (ref 0.0–0.2)

## 2019-03-23 LAB — SARS CORONAVIRUS 2 (TAT 6-24 HRS): SARS Coronavirus 2: NEGATIVE

## 2019-03-23 NOTE — Progress Notes (Signed)
Pt denies SOB, chest pain, and being under the care of a cardiologist. Pt stated that PCP is Dr. Delma Freeze. Pt denies having an echo and cardiac cath. Pt denies recent labs. Pt denies having a chest x ray in the last year. Pt verbalized understanding of all pre-op instructions. Pt chart forwarded to PA, Anesthesiology, for review.

## 2019-03-23 NOTE — Pre-Procedure Instructions (Signed)
   [name] [name]  03/23/2019     [facility] - [place], [place] - [address] [place] [address] Phone: [phone] Fax: [phone]   Your procedure is scheduled on Wednesday, March 25, 2019  Report to [facility] Admitting at 9:10 A.M.  Call this number if you have problems the morning of surgery:  [phone]   Remember: Brush your teeth the morning of surgery with your regular toothpaste.  Do not eat or drink after midnight Tuesday, March 24, 2019    Take these medicines the morning of surgery with A SIP OF WATER :  FLUoxetine (PROZAC),  omeprazole (PRILOSEC),  If needed:  HYDROcodone acetaminophen (NORCO) tablet for pain If needed:  alprazolam [name] for anxiety If needed: acyclovir (ZOVIRAX) for breakouts on nose Stop taking Aspirin (unless otherwise advised by surgeon), vitamins, fish oil, Biotin and herbal medications. Do not take any NSAIDs ie: Ibuprofen, Advil, Naproxen (Aleve), Motrin, meloxicam (MOBIC),  BC and Goody Powder; stop now.  Do not wear jewelry, make-up or nail polish.  Do not wear lotions, powders, or perfumes, or deodorant.  Do not shave 48 hours prior to surgery.   Do not bring valuables to the hospital.  [facility] is not responsible for any belongings or valuables.  Contacts, dentures or bridgework may not be worn into surgery.  Leave your suitcase in the car.  After surgery it may be brought to your room. For patients admitted to the hospital, discharge time will be determined by your treatment team. Patients discharged the day of surgery will not be allowed to drive home.  Special instructions: See " [facility] Preparing For Surgery" sheet. Please read over the following fact sheets that you were given. Pain Booklet, Coughing and Deep Breathing and Surgical Site Infection Prevention

## 2019-03-23 NOTE — Progress Notes (Signed)
Pt verbally instructed to bring CPAP mask, hose and machine on DOS.

## 2019-03-24 NOTE — Progress Notes (Signed)
Anesthesia Chart Review:   Case: [id number] Date/Time: 03/25/19 1045   Procedure: IMPLANTATION OF HYPOGLOSSAL NERVE STIMULATOR (N/A )   Anesthesia type: General   Pre-op diagnosis: G47.33 OSA (Obstructive sleep apnea)   [facility]: [facility] OR ROOM 09 / [facility] OR   Surgeon: Christia Reading, MD      DISCUSSION: Patient is a 63 year old female scheduled for the above procedure. She is s/p drug induced endoscopy at [facility] on 01/30/19.   History includes smoking, HLD, OSA (s/p UPPP; uses CPAP), PPD positive (~ 2007), short-term memory loss, Bipolar 1 disorder, Mnire's disease, HTN, post-concussion syndrome (following fall with minor head injury, 07/01/18), C5-6 ACDF (2006), back surgery (2003). BMI is consistent with mild obesity.   Patient denied chest pain and SOB at her PAT RN visit. She is followed by cardiologist Dr. Mayford Knife, but for OSA. Patient on a walking regimen at her last evaluation in April 2020 and had lost 20 lbs. Patient tolerated anesthesia two months ago. Reviewed case with anesthesiologist Karna Christmas, MD. If no acute changes then it is anticipated that patient can proceed as planned; however definitive plan following face-to-face evaluation with assigned anesthesiologist on the day of surgery. Negative COVID-19 test 03/23/19.    VS: BP (!) 142/83   Pulse 69   Temp 36.9 C   Resp 20   Ht 5\' 3"  (1.6 m)   Wt 81.8 kg   SpO2 100%   BMI 31.94 kg/m    PROVIDERS: , MD is PCP. Last visit 01/28/19. - She is followed by cardiologist 03/30/19, MD for OSA. She referred patient to Dr. Armanda Magic after patient expressed interest in getting an Inspire implant. As of 09/29/18 evaluation, patient was on a walking regimen and had cut out soda and able to lose 20 lbs. She saw 10/01/18, MD during 05/2016 admission for chest pain and had intermediate ETT. She saw PCP Dr. 06/2016 in follow-up and thought symptoms likely related to esophageal spasms and anxiety. She has had continued follow-up  with Dr. Clent Ridges over the years and continues to see Dr. Clent Ridges, but for OSA.     LABS: Labs reviewed: Acceptable for surgery. (all labs ordered are listed, but only abnormal results are displayed)  Labs Reviewed  BASIC METABOLIC PANEL  CBC     EKG: 04/03/18: NSR   CV: ETT 06/07/16: Study Highlights  Blood pressure demonstrated a hypertensive response to exercise.  Upsloping ST segment depression ST segment depression was noted during stress in the II, III, aVF, V4, V5 and V6 leads, beginning at 1 minutes of stress, and returning to baseline after 1-5 minutes of recovery.  No arrhythmias during stress or recovery.  ECG was interpretable and there was no significant change from baseline.  85% of maximum heart rate was achieved after 1.3 minutes. Test stopped due to fatigue and shortness of breath. No chest pain. Recovery time 4 minutes. Duke Treadmill Score: intermediate risk.  - (Study done during admission for chest pain. Ruled out for MI by cardiac enzymes. On 06/07/16, per 06/09/16, RN, "Spoke with Emelda Brothers PA/ Dr. Theodore Demark. Stated that patient can be discharged from cardiac standpoint. No mention of need for cardiology follow-up. She saw Eden Emms, MD on 06/29/16 for hospital follow-up. Chest pains felt non-cardiac, probably related to esophageal spasms and to anxiety. Has had on-going primary care with Dr. 08/27/16 and OSA follow-up with Clent Ridges, MD.)   Past Medical History:  Diagnosis Date  . Allergy    seasonal  .  Anxiety   . Arthritis   . Bell's palsy 2008   pt states she had a 'mini stroke' and nervous breakdown which caused facial drooping  . Bipolar 1 disorder ([place])   . CPAP (continuous positive airway pressure) dependence   . Depression    sees the [facility]   . GERD (gastroesophageal reflux disease)   . Hyperlipidemia   . Hypertension    meds no longer needed  . Memory loss    short term  . Meniere disease   . Obesity (BMI 30-39.9) 11/10/2015   . Post concussion syndrome 07/01/2018  . PPD positive 2007 or 2008  . Sleep apnea    on CPAP therapy  . Urinary incontinence   . Vestibular disorder   . Wears dentures   . Wears glasses     Past Surgical History:  Procedure Laterality Date  . ABDOMINAL HYSTERECTOMY    . ANTERIOR FUSION CERVICAL SPINE  2006   Dr. Saintclair Halsted  . BREAST BIOPSY Right 1981   benign  . BREAST EXCISIONAL BIOPSY Right 1981   benign   scar does not show  . BREAST SURGERY Bilateral 12-11-12   reductions per Dr. Julious Oka in [facility]   . CATARACT EXTRACTION, BILATERAL Bilateral last summer of 2018   at [facility]  . COLONOSCOPY  04/25/2016   per Dr. Collene Mares, adenomatous polyps, repeat in 5 yrs   . DRUG INDUCED ENDOSCOPY N/A 01/30/2019   Procedure: DRUG INDUCED ENDOSCOPY;  Surgeon: Melida Quitter, MD;  Location: [facility];  Service: ENT;  Laterality: N/A;  . ESOPHAGOGASTRODUODENOSCOPY  04-23-11   per Dr. Collene Mares, normal   . EYE SURGERY  06/18/2016   L eyelid   . LUMBAR DISC SURGERY  2003   Dr. Saintclair Halsted  . MULTIPLE TOOTH EXTRACTIONS    . ORIF WRIST FRACTURE Right 08/29/2018  . REDUCTION MAMMAPLASTY Bilateral 2013  . SPLIT NIGHT STUDY  11/11/2015  . UVULOPALATOPHARYNGOPLASTY    . VAGINAL HYSTERECTOMY  12/96   secondary to fibroids.    MEDICATIONS: . acyclovir (ZOVIRAX) 400 MG tablet  . alprazolam (XANAX) 2 MG tablet  . azelastine (ASTELIN) 0.1 % nasal spray  . Biotin 5000 MCG TABS  . Cholecalciferol (VITAMIN D3) 125 MCG (5000 UT) CAPS  . FLUoxetine (PROZAC) 10 MG tablet  . fluticasone (FLONASE) 50 MCG/ACT nasal spray  . [START ON 03/30/2019] HYDROcodone-acetaminophen (NORCO) 10-325 MG tablet  . hydrOXYzine (ATARAX/VISTARIL) 50 MG tablet  . meloxicam (MOBIC) 15 MG tablet  . mirabegron ER (MYRBETRIQ) 25 MG TB24 tablet  . Multiple Vitamin (MULTIVITAMIN WITH MINERALS) TABS tablet  . omeprazole (PRILOSEC) 20 MG capsule  . polyethylene glycol powder (GLYCOLAX/MIRALAX) powder  . potassium  chloride (K-DUR) 10 MEQ tablet  . Probiotic Product (PROBIOTIC PO)  . simvastatin (ZOCOR) 80 MG tablet   No current facility-administered medications for this encounter.      Myra Gianotti, PA-C Surgical Short Stay/Anesthesiology [facility] Phone [phone] [facility] Phone [phone] 03/24/2019 12:03 PM

## 2019-03-24 NOTE — Anesthesia Preprocedure Evaluation (Addendum)
Anesthesia Evaluation  Patient identified by MRN, date of birth, ID band Patient awake    Reviewed: Allergy & Precautions, NPO status , Patient's Chart, lab work & pertinent test results  Airway Mallampati: II  TM Distance: >3 FB Neck ROM: Full    Dental no notable dental hx.    Pulmonary sleep apnea , Current Smoker and Patient abstained from smoking.,    Pulmonary exam normal breath sounds clear to auscultation       Cardiovascular hypertension, Pt. on medications negative cardio ROS Normal cardiovascular exam Rhythm:Regular Rate:Normal     Neuro/Psych Anxiety Depression Bipolar Disorder negative neurological ROS  negative psych ROS   GI/Hepatic Neg liver ROS, GERD  ,  Endo/Other  negative endocrine ROS  Renal/GU negative Renal ROS  negative genitourinary   Musculoskeletal  (+) Arthritis , Osteoarthritis,    Abdominal (+) + obese,   Peds negative pediatric ROS (+)  Hematology negative hematology ROS (+)   Anesthesia Other Findings   Reproductive/Obstetrics negative OB ROS                            Anesthesia Physical Anesthesia Plan  ASA: II  Anesthesia Plan: General   Post-op Pain Management:    Induction: Intravenous  PONV Risk Score and Plan: 2 and Ondansetron, Midazolam and Treatment may vary due to age or medical condition  Airway Management Planned: Oral ETT  Additional Equipment:   Intra-op Plan:   Post-operative Plan: Extubation in OR  Informed Consent: I have reviewed the patients History and Physical, chart, labs and discussed the procedure including the risks, benefits and alternatives for the proposed anesthesia with the patient or authorized representative who has indicated his/her understanding and acceptance.     Dental advisory given  Plan Discussed with: CRNA  Anesthesia Plan Comments: (PAT note written 03/24/2019 by Myra Gianotti, PA-C. )        Anesthesia Quick Evaluation

## 2019-03-25 ENCOUNTER — Encounter (HOSPITAL_COMMUNITY)
Admission: RE | Disposition: A | Discharge status: Home / Self Care | Payer: Self-pay | Source: Home / Self Care | Attending: Otolaryngology

## 2019-03-25 ENCOUNTER — Encounter (HOSPITAL_COMMUNITY): Payer: Self-pay | Admitting: Orthopedic Surgery

## 2019-03-25 ENCOUNTER — Ambulatory Visit (HOSPITAL_COMMUNITY): Payer: PPO | Admitting: Vascular Surgery

## 2019-03-25 ENCOUNTER — Other Ambulatory Visit: Payer: Self-pay

## 2019-03-25 ENCOUNTER — Ambulatory Visit (HOSPITAL_COMMUNITY): Payer: PPO

## 2019-03-25 ENCOUNTER — Ambulatory Visit (HOSPITAL_COMMUNITY)
Admission: RE | Admit: 2019-03-25 | Discharge: 2019-03-25 | Disposition: A | Discharge status: Home / Self Care | Payer: PPO | Attending: Otolaryngology | Admitting: Otolaryngology

## 2019-03-25 ENCOUNTER — Other Ambulatory Visit: Payer: Self-pay | Admitting: Otolaryngology

## 2019-03-25 ENCOUNTER — Ambulatory Visit (HOSPITAL_COMMUNITY): Payer: PPO | Admitting: Certified Registered Nurse Anesthetist

## 2019-03-25 DIAGNOSIS — E669 Obesity, unspecified: Secondary | ICD-10-CM | POA: Diagnosis not present

## 2019-03-25 DIAGNOSIS — I10 Essential (primary) hypertension: Secondary | ICD-10-CM | POA: Diagnosis not present

## 2019-03-25 DIAGNOSIS — E785 Hyperlipidemia, unspecified: Secondary | ICD-10-CM | POA: Insufficient documentation

## 2019-03-25 DIAGNOSIS — K219 Gastro-esophageal reflux disease without esophagitis: Secondary | ICD-10-CM | POA: Diagnosis not present

## 2019-03-25 DIAGNOSIS — F319 Bipolar disorder, unspecified: Secondary | ICD-10-CM | POA: Insufficient documentation

## 2019-03-25 DIAGNOSIS — F419 Anxiety disorder, unspecified: Secondary | ICD-10-CM | POA: Diagnosis not present

## 2019-03-25 DIAGNOSIS — Z882 Allergy status to sulfonamides status: Secondary | ICD-10-CM | POA: Diagnosis not present

## 2019-03-25 DIAGNOSIS — G4733 Obstructive sleep apnea (adult) (pediatric): Secondary | ICD-10-CM

## 2019-03-25 DIAGNOSIS — Z885 Allergy status to narcotic agent status: Secondary | ICD-10-CM | POA: Insufficient documentation

## 2019-03-25 DIAGNOSIS — Z8673 Personal history of transient ischemic attack (TIA), and cerebral infarction without residual deficits: Secondary | ICD-10-CM | POA: Insufficient documentation

## 2019-03-25 DIAGNOSIS — Z6831 Body mass index (BMI) 31.0-31.9, adult: Secondary | ICD-10-CM | POA: Insufficient documentation

## 2019-03-25 DIAGNOSIS — Z791 Long term (current) use of non-steroidal anti-inflammatories (NSAID): Secondary | ICD-10-CM | POA: Insufficient documentation

## 2019-03-25 DIAGNOSIS — R42 Dizziness and giddiness: Secondary | ICD-10-CM | POA: Diagnosis not present

## 2019-03-25 DIAGNOSIS — Z79899 Other long term (current) drug therapy: Secondary | ICD-10-CM | POA: Insufficient documentation

## 2019-03-25 DIAGNOSIS — M1990 Unspecified osteoarthritis, unspecified site: Secondary | ICD-10-CM | POA: Insufficient documentation

## 2019-03-25 DIAGNOSIS — F17210 Nicotine dependence, cigarettes, uncomplicated: Secondary | ICD-10-CM | POA: Diagnosis not present

## 2019-03-25 HISTORY — PX: IMPLANTATION OF HYPOGLOSSAL NERVE STIMULATOR: SHX6827

## 2019-03-25 SURGERY — INSERTION, HYPOGLOSSAL NERVE STIMULATOR
Anesthesia: General | Site: Chest | Laterality: Right

## 2019-03-25 MED ORDER — PHENYLEPHRINE HCL (PRESSORS) 10 MG/ML IV SOLN
INTRAVENOUS | Status: DC | PRN
  Administered 2019-03-25: 80 ug via INTRAVENOUS

## 2019-03-25 MED ORDER — OXYCODONE HCL 5 MG PO TABS: 5.0000 mg | ORAL_TABLET | Freq: Once | ORAL | Status: DC | PRN

## 2019-03-25 MED ORDER — LIDOCAINE-EPINEPHRINE 1 %-1:100000 IJ SOLN
INTRAMUSCULAR | Status: AC
  Filled 2019-03-25: qty 1

## 2019-03-25 MED ORDER — EPHEDRINE 5 MG/ML INJ
INTRAVENOUS | Status: AC
  Filled 2019-03-25: qty 10

## 2019-03-25 MED ORDER — ONDANSETRON HCL 4 MG/2ML IJ SOLN
INTRAMUSCULAR | Status: AC
  Filled 2019-03-25: qty 2

## 2019-03-25 MED ORDER — MIDAZOLAM HCL 2 MG/2ML IJ SOLN
INTRAMUSCULAR | Status: AC
  Filled 2019-03-25: qty 2

## 2019-03-25 MED ORDER — PROMETHAZINE HCL 25 MG/ML IJ SOLN: 6.2500 mg | INTRAMUSCULAR | Status: DC | PRN

## 2019-03-25 MED ORDER — SUCCINYLCHOLINE CHLORIDE 200 MG/10ML IV SOSY
PREFILLED_SYRINGE | INTRAVENOUS | Status: AC
  Filled 2019-03-25: qty 10

## 2019-03-25 MED ORDER — PHENYLEPHRINE 40 MCG/ML (10ML) SYRINGE FOR IV PUSH (FOR BLOOD PRESSURE SUPPORT)
PREFILLED_SYRINGE | INTRAVENOUS | Status: AC
  Filled 2019-03-25: qty 20

## 2019-03-25 MED ORDER — LIDOCAINE HCL (PF) 1 % IJ SOLN
INTRAMUSCULAR | Status: AC
  Filled 2019-03-25: qty 30

## 2019-03-25 MED ORDER — LIDOCAINE HCL (CARDIAC) PF 100 MG/5ML IV SOSY
PREFILLED_SYRINGE | INTRAVENOUS | Status: DC | PRN
  Administered 2019-03-25: 80 mg via INTRAVENOUS

## 2019-03-25 MED ORDER — FENTANYL CITRATE (PF) 100 MCG/2ML IJ SOLN
INTRAMUSCULAR | Status: DC | PRN
  Administered 2019-03-25: 100 ug via INTRAVENOUS
  Administered 2019-03-25 (×3): 50 ug via INTRAVENOUS

## 2019-03-25 MED ORDER — MIDAZOLAM HCL 5 MG/5ML IJ SOLN
INTRAMUSCULAR | Status: DC | PRN
  Administered 2019-03-25: 2 mg via INTRAVENOUS

## 2019-03-25 MED ORDER — SUCCINYLCHOLINE CHLORIDE 20 MG/ML IJ SOLN
INTRAMUSCULAR | Status: DC | PRN
  Administered 2019-03-25: 120 mg via INTRAVENOUS

## 2019-03-25 MED ORDER — CEFAZOLIN SODIUM-DEXTROSE 2-4 GM/100ML-% IV SOLN
INTRAVENOUS | Status: AC
  Filled 2019-03-25: qty 100

## 2019-03-25 MED ORDER — DEXAMETHASONE SODIUM PHOSPHATE 10 MG/ML IJ SOLN
INTRAMUSCULAR | Status: AC
  Filled 2019-03-25: qty 2

## 2019-03-25 MED ORDER — DEXAMETHASONE SODIUM PHOSPHATE 10 MG/ML IJ SOLN
INTRAMUSCULAR | Status: DC | PRN
  Administered 2019-03-25: 10 mg via INTRAVENOUS

## 2019-03-25 MED ORDER — EPHEDRINE SULFATE 50 MG/ML IJ SOLN
INTRAMUSCULAR | Status: DC | PRN
  Administered 2019-03-25: 10 mg via INTRAVENOUS
  Administered 2019-03-25: 5 mg via INTRAVENOUS

## 2019-03-25 MED ORDER — CEFAZOLIN SODIUM-DEXTROSE 2-3 GM-%(50ML) IV SOLR
INTRAVENOUS | Status: DC | PRN
  Administered 2019-03-25: 2 g via INTRAVENOUS

## 2019-03-25 MED ORDER — ONDANSETRON HCL 4 MG/2ML IJ SOLN
INTRAMUSCULAR | Status: DC | PRN
  Administered 2019-03-25: 4 mg via INTRAVENOUS

## 2019-03-25 MED ORDER — OXYCODONE HCL 5 MG/5ML PO SOLN: 5.0000 mg | Freq: Once | ORAL | Status: DC | PRN

## 2019-03-25 MED ORDER — PROPOFOL 10 MG/ML IV BOLUS
INTRAVENOUS | Status: DC | PRN
  Administered 2019-03-25: 150 mg via INTRAVENOUS

## 2019-03-25 MED ORDER — SODIUM CHLORIDE 0.9 % IV SOLN
INTRAVENOUS | Status: DC | PRN
  Administered 2019-03-25: 500 mL

## 2019-03-25 MED ORDER — LACTATED RINGERS IV SOLN
INTRAVENOUS | Status: DC
  Administered 2019-03-25: 10:00:00 via INTRAVENOUS

## 2019-03-25 MED ORDER — PROPOFOL 10 MG/ML IV BOLUS
INTRAVENOUS | Status: AC
  Filled 2019-03-25: qty 20

## 2019-03-25 MED ORDER — SUFENTANIL CITRATE 50 MCG/ML IV SOLN
INTRAVENOUS | Status: AC
  Filled 2019-03-25: qty 1

## 2019-03-25 MED ORDER — HYDROMORPHONE HCL 1 MG/ML IJ SOLN: 0.2500 mg | INTRAMUSCULAR | Status: DC | PRN

## 2019-03-25 MED ORDER — LIDOCAINE-EPINEPHRINE 1 %-1:100000 IJ SOLN
INTRAMUSCULAR | Status: DC | PRN
  Administered 2019-03-25: 6.5 mL

## 2019-03-25 MED ORDER — PROPOFOL 500 MG/50ML IV EMUL
INTRAVENOUS | Status: DC | PRN
  Administered 2019-03-25: 50 ug/kg/min via INTRAVENOUS

## 2019-03-25 MED ORDER — 0.9 % SODIUM CHLORIDE (POUR BTL) OPTIME
TOPICAL | Status: DC | PRN
  Administered 2019-03-25: 1000 mL

## 2019-03-25 MED ORDER — STERILE WATER FOR IRRIGATION IR SOLN
Status: DC | PRN
  Administered 2019-03-25: 1000 mL

## 2019-03-25 MED ORDER — FENTANYL CITRATE (PF) 250 MCG/5ML IJ SOLN
INTRAMUSCULAR | Status: AC
  Filled 2019-03-25: qty 5

## 2019-03-25 MED ORDER — LIDOCAINE 2% (20 MG/ML) 5 ML SYRINGE
INTRAMUSCULAR | Status: AC
  Filled 2019-03-25: qty 5

## 2019-03-25 MED ORDER — SODIUM CHLORIDE 0.9 % IV SOLN
INTRAVENOUS | Status: AC
  Filled 2019-03-25: qty 500000

## 2019-03-25 MED ORDER — SODIUM CHLORIDE 0.9 % IV SOLN
INTRAVENOUS | Status: DC | PRN
  Administered 2019-03-25: 40 ug/min via INTRAVENOUS

## 2019-03-25 MED ORDER — LACTATED RINGERS IV SOLN
INTRAVENOUS | Status: DC | PRN
  Administered 2019-03-25: 11:00:00 via INTRAVENOUS

## 2019-03-25 MED ORDER — MEPERIDINE HCL 25 MG/ML IJ SOLN: 6.2500 mg | INTRAMUSCULAR | Status: DC | PRN

## 2019-03-25 SURGICAL SUPPLY — 69 items
ADH SKN CLS APL DERMABOND .7 (GAUZE/BANDAGES/DRESSINGS) ×2
BAG DECANTER FOR FLEXI CONT (MISCELLANEOUS) ×2 IMPLANT
BLADE CLIPPER SURG (BLADE) IMPLANT
BLADE SURG 15 STRL LF DISP TIS (BLADE) ×3 IMPLANT
BLADE SURG 15 STRL SS (BLADE) ×6
CANISTER SUCT 3000ML PPV (MISCELLANEOUS) ×2 IMPLANT
CORD BIPOLAR FORCEPS 12FT (ELECTRODE) ×2 IMPLANT
COVER PROBE W GEL 5X96 (DRAPES) ×2 IMPLANT
COVER SURGICAL LIGHT HANDLE (MISCELLANEOUS) ×2 IMPLANT
DERMABOND ADVANCED (GAUZE/BANDAGES/DRESSINGS) ×2
DERMABOND ADVANCED .7 DNX12 (GAUZE/BANDAGES/DRESSINGS) ×2 IMPLANT
DRAPE C-ARM 35X43 STRL (DRAPES) ×1 IMPLANT
DRAPE HEAD BAR (DRAPES) ×2 IMPLANT
DRAPE INCISE IOBAN 66X45 STRL (DRAPES) ×2 IMPLANT
DRAPE MICROSCOPE LEICA 54X105 (DRAPE) ×2 IMPLANT
DRAPE UTILITY XL STRL (DRAPES) ×1 IMPLANT
DRSG TEGADERM 2-3/8X2-3/4 SM (GAUZE/BANDAGES/DRESSINGS) ×5 IMPLANT
ELECT COATED BLADE 2.86 ST (ELECTRODE) ×2 IMPLANT
ELECT EMG 18 NIMS (NEUROSURGERY SUPPLIES) ×2
ELECT REM PT RETURN 9FT ADLT (ELECTROSURGICAL) ×2
ELECTRODE EMG 18 NIMS (NEUROSURGERY SUPPLIES) ×1 IMPLANT
ELECTRODE REM PT RTRN 9FT ADLT (ELECTROSURGICAL) ×1 IMPLANT
FORCEPS BIPOLAR SPETZLER 8 1.0 (NEUROSURGERY SUPPLIES) ×2 IMPLANT
GAUZE 4X4 16PLY RFD (DISPOSABLE) ×2 IMPLANT
GAUZE SPONGE 4X4 12PLY STRL (GAUZE/BANDAGES/DRESSINGS) ×2 IMPLANT
GENERATOR PULSE INSPIRE (Generator) ×2 IMPLANT
GENERATOR PULSE INSPIRE IV (Generator) ×1 IMPLANT
GLOVE BIO SURGEON STRL SZ 6.5 (GLOVE) IMPLANT
GLOVE BIO SURGEON STRL SZ7.5 (GLOVE) ×2 IMPLANT
GOWN STRL REUS W/ TWL LRG LVL3 (GOWN DISPOSABLE) ×3 IMPLANT
GOWN STRL REUS W/TWL LRG LVL3 (GOWN DISPOSABLE) ×6
IV CATH 18GX1.25 SAFE RETR GRN (IV SOLUTION) ×1 IMPLANT
KIT BASIN OR (CUSTOM PROCEDURE TRAY) ×2 IMPLANT
KIT NEUROSTIMULATOR ACCESSORY (KITS) IMPLANT
KIT TURNOVER KIT B (KITS) ×2 IMPLANT
LEAD SENSING RESP INSPIRE (Lead) ×2 IMPLANT
LEAD SENSING RESP INSPIRE IV (Lead) ×1 IMPLANT
LEAD SLEEP STIM INSPIRE IV/V (Lead) ×1 IMPLANT
LEAD SLEEP STIMULATION INSPIRE (Lead) ×2 IMPLANT
LOOP VESSEL MAXI BLUE (MISCELLANEOUS) ×2 IMPLANT
LOOP VESSEL MINI RED (MISCELLANEOUS) ×1 IMPLANT
MARKER SKIN DUAL TIP RULER LAB (MISCELLANEOUS) ×4 IMPLANT
NDL HYPO 25GX1X1/2 BEV (NEEDLE) ×1 IMPLANT
NEEDLE HYPO 25GX1X1/2 BEV (NEEDLE) ×2 IMPLANT
NS IRRIG 1000ML POUR BTL (IV SOLUTION) ×2 IMPLANT
PAD ARMBOARD 7.5X6 YLW CONV (MISCELLANEOUS) ×2 IMPLANT
PASSER CATH 38CM DISP (INSTRUMENTS) ×2 IMPLANT
PENCIL BUTTON HOLSTER BLD 10FT (ELECTRODE) ×2 IMPLANT
POSITIONER HEAD DONUT 9IN (MISCELLANEOUS) ×2 IMPLANT
PROBE NERVE STIMULATOR (NEUROSURGERY SUPPLIES) ×2 IMPLANT
REMOTE CONTROL SLEEP INSPIRE (MISCELLANEOUS) ×2 IMPLANT
SET WALTER ACTIVATION W/DRAPE (SET/KITS/TRAYS/PACK) ×1 IMPLANT
SLING ARM FOAM STRAP LRG (SOFTGOODS) ×1 IMPLANT
SLING ARM FOAM STRAP MED (SOFTGOODS) IMPLANT
SPONGE INTESTINAL PEANUT (DISPOSABLE) ×1 IMPLANT
STAPLER VISISTAT 35W (STAPLE) ×2 IMPLANT
SUT SILK 2 0 SH (SUTURE) ×2 IMPLANT
SUT SILK 3 0 REEL (SUTURE) ×2 IMPLANT
SUT SILK 3 0 SH 30 (SUTURE) ×4 IMPLANT
SUT SILK 3-0 (SUTURE) ×2
SUT SILK 3-0 RB1 30XBRD (SUTURE) ×1
SUT VIC AB 3-0 SH 27 (SUTURE) ×4
SUT VIC AB 3-0 SH 27X BRD (SUTURE) ×1 IMPLANT
SUT VIC AB 4-0 PS2 27 (SUTURE) ×4 IMPLANT
SUTURE SILK 3-0 RB1 30XBRD (SUTURE) ×1 IMPLANT
SYR 10ML LL (SYRINGE) ×2 IMPLANT
TAPE CLOTH SURG 4X10 WHT LF (GAUZE/BANDAGES/DRESSINGS) ×2 IMPLANT
TOWEL GREEN STERILE (TOWEL DISPOSABLE) ×2 IMPLANT
TRAY ENT MC OR (CUSTOM PROCEDURE TRAY) ×2 IMPLANT

## 2019-03-25 NOTE — Op Note (Signed)
NAME: ODYSSEY, VASBINDER MEDICAL RECORD LY:[id number] ACCOUNT [id number] DATE OF BIRTH:[birth year] FACILITY: [facility] LOCATION: [facility] PHYSICIAN:Lashelle Koy D. Nilda Keathley, MD  OPERATIVE REPORT  DATE OF PROCEDURE:  03/25/2019  PREOPERATIVE DIAGNOSIS:  Obstructive sleep apnea.  POSTOPERATIVE DIAGNOSIS:  Obstructive sleep apnea.  PROCEDURE:  Placement of hypoglossal nerve stimulator.  SURGEON:  Christia Reading, MD  ASSISTANT:  Aquilla Hacker, PA-C, who was necessary to assist with retraction and closure of the wounds.  ANESTHESIA:  General endotracheal anesthesia.  COMPLICATIONS:  None.  INDICATIONS:  The patient is a 63 year old female with obstructive sleep apnea with an AHI of 25 who has had difficulty tolerating CPAP.  She presents to the operating room for placement of a hypoglossal nerve stimulator.  FINDINGS:  Surgical anatomy was all normal.  The device was tested intraoperatively and had an excellent response.  DESCRIPTION OF PROCEDURE:  The patient was identified in the holding room, informed consent having been obtained including discussion of risks, benefits, and alternatives.  The patient was brought to the operative suite and put on the operative table in  supine position.  Anesthesia was induced and the patient was intubated by the anesthesia team without difficulty.  The patient was given intravenous antibiotics during the case.  The eyes were taped closed, and the bed was turned 180 degrees from  anesthesia.  The nerve integrity monitor electrodes were placed in the tongue on the right lateral side as well as in the floor of mouth in the standard fashion, and the monitor was turned on during the case.  The incisions were marked using a marking  pen on the right neck and on the right chest, measuring these out precisely.  Each incision was then injected with 1% lidocaine with 1:100,000 epinephrine.  The right neck and chest were prepped and draped in sterile fashion.  The neck  incision was made  with a 15-blade scalpel through the skin and extended through the subcutaneous and platysmal layer using Bovie electrocautery.  Dissection was then performed directly down onto the digastric muscle where the tendon was exposed and vessel loops placed  around the tendon for inferior retraction.  The mylohyoid muscle was identified and retracted anteriorly, exposing the hypoglossal nerve.  The nerve was cleared of fascial connections and vessels using bipolar electrocautery, exposing the branches of the  nerve.  The nerve was then carefully dissected, determining the likely break point between the inclusion and exclusion branches.  This was then tested with the nerve stimulator and the proper break point was identified.  The nerve was elevated, allowing  space for the stimulating electrode to be placed, and a vessel loop was placed around the nerve to help with this.  The electrode was then laid in and placed underneath the nerve and wrapped into place with proper positioning and was then filled with  saline underneath the electrodes between the nerve and the electrode using an Angiocath.  The anchor was then sutured to the digastric tendon using 3-0 silk suture in 2 places.  The vessel loops were all removed.  The electrode was then tucked in and a  damp gauze placed.  The subclavicular incision was then made with a 15-blade scalpel through the skin and extended through the subcutaneous tissues using Bovie electrocautery down to the pectoralis fascia.  A pocket was then made over the pectoralis  fascia using finger dissection, and 2-0 silk sutures were then placed in the superior extent of the pocket.  The lateral chest wall incision on the right  was then made with a 15-blade scalpel and extended through subcutaneous tissues using Bovie  electrocautery down to the serratus muscle overlying the chosen intercostal space.  The serratus muscle was then bluntly dissected, exposing the external  intercostal muscle, which was then also bluntly dissected and exposing the internal intercostal  muscle.  The malleable was then placed in a posterior direction, and the sense lead was then placed into the space between the external and internal intercostal muscles, removing the malleable.  The anchor was sutured using 3-0 silk suture.  At this  point, the tunneler was used to tunnel from the subclavicular dissection down to the lateral chest wall wound, and the electrode was pulled into position.  The second anchor was then sutured in place in the lateral chest wall incision site using 3-0 silk  suture.  The stimulating lead was then tunneled using the tunneler under the platysmal layer, down over the clavicle and into the subclavicular site as well, and the electrode was pulled through.  At this point, the 2 electrodes were cleaned off and  placed into the generator and tightened into place using the torque screwdriver to 2 clicks each.  The device was then placed into the pocket and tested.  Testing demonstrated excellent response with tongue protrusion and a good sense lead response as  well.  At this point, the wounds were closed using 3-0 Vicryl suture in a simple interrupted fashion in the platysmal layer in the neck, and then all 3 were closed with 4-0 Vicryl suture in the subcutaneous layer and Dermabond on the skin.  Drapes were  removed and the patient was cleaned off.  Pressure dressings were placed over each wound.  She was returned to anesthesia for wakeup and was extubated in the recovery room in stable condition.  LN/NUANCE  D:03/25/2019 T:03/25/2019 JOB:[id number]

## 2019-03-25 NOTE — Discharge Instructions (Signed)
Remove gauze dressing tomorrow and may shower. Leave skin glue, it will fall off by itself in 10 - 14 days. Do not rub, pat dry after shower.

## 2019-03-25 NOTE — Anesthesia Postprocedure Evaluation (Signed)
Anesthesia Post Note  Patient: [name] [name]  Procedure(s) Performed: IMPLANTATION OF HYPOGLOSSAL NERVE STIMULATOR (Right Chest)     Patient location during evaluation: PACU Anesthesia Type: General Level of consciousness: awake and alert Pain management: pain level controlled Vital Signs Assessment: post-procedure vital signs reviewed and stable Respiratory status: spontaneous breathing, nonlabored ventilation and respiratory function stable Cardiovascular status: blood pressure returned to baseline and stable Postop Assessment: no apparent nausea or vomiting Anesthetic complications: no    Last Vitals:  Vitals:   03/25/19 1415 03/25/19 1417  BP:  (!) 147/72  Pulse: 84 86  Resp:    Temp:    SpO2: 100% 98%    Last Pain:  Vitals:   03/25/19 1311  TempSrc:   PainSc: 0-No pain                 Lynda Rainwater

## 2019-03-25 NOTE — Brief Op Note (Signed)
03/25/2019  1:15 PM  PATIENT:  [name] [name]  63 y.o. female  PRE-OPERATIVE DIAGNOSIS:  G47.33 OSA (Obstructive sleep apnea)  POST-OPERATIVE DIAGNOSIS:  G47.33 OSA (Obstructive sleep apnea)  PROCEDURE:  Procedure(s): IMPLANTATION OF HYPOGLOSSAL NERVE STIMULATOR (Right)  SURGEON:  Surgeon(s) and Role:    Melida Quitter, MD - Primary  PHYSICIAN ASSISTANT: Sallee Provencal  ASSISTANTS: none   ANESTHESIA:   general  EBL:  20 mL   BLOOD ADMINISTERED:none  DRAINS: none   LOCAL MEDICATIONS USED:  LIDOCAINE   SPECIMEN:  No Specimen  DISPOSITION OF SPECIMEN:  N/A  COUNTS:  YES  TOURNIQUET:  * No tourniquets in log *  DICTATION: .Other Dictation: Dictation Number [id number]  PLAN OF CARE: Discharge to home after PACU  PATIENT DISPOSITION:  PACU - hemodynamically stable.   Delay start of Pharmacological VTE agent (>24hrs) due to surgical blood loss or risk of bleeding: no

## 2019-03-25 NOTE — H&P (Signed)
[name] GibbonsShelia Kay [name] is an 63 y.o. female.   Chief Complaint: Sleep apnea HPI: 63 year old female with obstructive sleep apnea with AHI of 25 having difficulty tolerating CPAP.  She presents for hypoglossal nerve stimulator placement.  Past Medical History:  Diagnosis Date  . Allergy    seasonal  . Anxiety   . Arthritis   . Bell's palsy 2008   pt states she had a 'mini stroke' and nervous breakdown which caused facial drooping  . Bipolar 1 disorder (HCC)   . CPAP (continuous positive airway pressure) dependence   . Depression    [facility]   . GERD (gastroesophageal reflux disease)   . Hyperlipidemia   . Hypertension    meds no longer needed  . Memory loss    short term  . Meniere disease   . Obesity (BMI 30-39.9) 11/10/2015  . Post concussion syndrome 07/01/2018  . PPD positive 2007 or 2008  . Sleep apnea    on CPAP therapy  . Urinary incontinence   . Vestibular disorder   . Wears dentures   . Wears glasses     Past Surgical History:  Procedure Laterality Date  . ABDOMINAL HYSTERECTOMY    . ANTERIOR FUSION CERVICAL SPINE  2006   Dr. Wynetta Emeryram  . BREAST BIOPSY Right 1981   benign  . BREAST EXCISIONAL BIOPSY Right 1981   benign   scar does not show  . BREAST SURGERY Bilateral 12-11-12   redu[facility]   . CATARACT EXTRACTION, BILATERAL Bilateral last summer of 2018   [facility]  . COLONOSCOPY  04/25/2016   per Dr. Loreta AveMann, adenomatous polyps, repeat in 5 yrs   . DRUG INDUCED ENDOSCOPY N/A 01/30/2019   Procedure: DRUG INDUCED ENDOSCOPY;  Surgeon: Christia ReadingBates, Carmella Kees, MD;  Location:  SURGERY CENTER;  Service: ENT;  Laterality: N/A;  . ESOPHAGOGASTRODUODENOSCOPY  04-23-11   per Dr. Loreta AveMann, normal   . EYE SURGERY  06/18/2016   L eyelid   . LUMBAR DISC SURGERY  2003   Dr. Wynetta Emeryram  . MULTIPLE TOOTH EXTRACTIONS    . ORIF WRIST FRACTURE Right 08/29/2018  . REDUCTION MAMMAPLASTY Bilateral 2013  . SPLIT NIGHT STUDY  11/11/2015  .  UVULOPALATOPHARYNGOPLASTY    . VAGINAL HYSTERECTOMY  12/96   secondary to fibroids.    Family History  Problem Relation Age of Onset  . Dementia Mother   . Hyperlipidemia Mother   . Asthma Father   . Hypertension Sister   . Hyperlipidemia Sister   . Coronary artery disease Sister   . Thyroid disease Sister   . Hypertension Brother   . Hyperlipidemia Brother   . Coronary artery disease Brother   . Hypertension Sister   . Hyperlipidemia Sister   . Alcohol abuse Other        fhx  . Arthritis Other        fhx  . Coronary artery disease Other        fhx  . Depression Other        fhx  . Diabetes Other        fhx  . Hyperlipidemia Other        fhx  . Hypertension Other        fhx  . Sudden death Other        fhx   Social History:  reports that she has been smoking cigarettes. She has smoked for the past 30.00 years. She has  never used smokeless tobacco. She reports that she does not drink alcohol or use drugs.  Allergies:  Allergies  Allergen Reactions  . Sulfonamide Derivatives Swelling    Facial swelling  . Flexeril [Cyclobenzaprine] Other (See Comments)    Dizziness, made meniere's disease worse   . Morphine And Related Nausea And Vomiting  . Sulfamethoxazole Other (See Comments)    Causes swelling     Medications Prior to Admission  Medication Sig Dispense Refill  . acyclovir (ZOVIRAX) 400 MG tablet Take 400 mg by mouth 4 (four) times daily as needed (break outs on nose).    [name] alprazolam (XANAX) 2 MG tablet TAKE 1 TABLET BY MOUTH 3 TIMES A DAY AS NEEDED (Patient taking differently: Take 2 mg by mouth 3 (three) times daily as needed for anxiety. ) 90 tablet 5  . azelastine (ASTELIN) 0.1 % nasal spray Place 2 sprays into both nostrils 2 (two) times daily. Use in each nostril as directed (Patient taking differently: Place 2 sprays into both nostrils 2 (two) times daily as needed for rhinitis or allergies. ) 30 mL 2  . Biotin 5000 MCG TABS Take 5,000 mcg by mouth  daily.    . Cholecalciferol (VITAMIN D3) 125 MCG (5000 UT) CAPS Take 5,000 Units by mouth daily.     [name] FLUoxetine (PROZAC) 10 MG tablet Take 2 tablets (20 mg total) by mouth daily. 60 tablet 11  . [START ON 03/30/2019] HYDROcodone-acetaminophen (NORCO) 10-325 MG tablet Take 1 tablet by mouth every 6 (six) hours as needed for moderate pain. 120 tablet 0  . hydrOXYzine (ATARAX/VISTARIL) 50 MG tablet TAKE 1 TABLET BY MOUTH AT BEDTIME. NEED OFFICE VISIT FOR FURTHER REFILLS (Patient taking differently: Take 50 mg by mouth at bedtime. ) 30 tablet 0  . meloxicam (MOBIC) 15 MG tablet TAKE 1 TABLET BY MOUTH DAILY. (Patient taking differently: Take 15 mg by mouth daily. ) 30 tablet 2  . mirabegron ER (MYRBETRIQ) 25 MG TB24 tablet Take 25 mg by mouth daily.    . Multiple Vitamin (MULTIVITAMIN WITH MINERALS) TABS tablet Take 1 tablet by mouth daily.    [name] omeprazole (PRILOSEC) 20 MG capsule TAKE 1 CAPSULE BY MOUTH 2 TIMES DAILY BEFORE A MEAL. (Patient taking differently: Take 20 mg by mouth 2 (two) times daily before a meal. ) 60 capsule 11  . polyethylene glycol powder (GLYCOLAX/MIRALAX) powder TAKE 17 GRAMS BY MOUTH 2 TIMES DAILY AS NEEDED. (Patient taking differently: Take 1 Container by mouth daily as needed for moderate constipation. ) 3350 g 11  . potassium chloride (K-DUR) 10 MEQ tablet TAKE 1 TABLET BY MOUTH DAILY. (Patient taking differently: Take 10 mEq by mouth daily. ) 30 tablet 2  . Probiotic Product (PROBIOTIC PO) Take 1 capsule by mouth daily.     . simvastatin (ZOCOR) 80 MG tablet TAKE 1 TABLET BY MOUTH AT BEDTIME. (Patient taking differently: Take 80 mg by mouth at bedtime. ) 90 tablet 3  . fluticasone (FLONASE) 50 MCG/ACT nasal spray Place 1 spray into both nostrils 2 (two) times daily. (Patient not taking: Reported on 03/19/2019) 16 g 1    Results for orders placed or performed during the hospital encounter of 03/23/19 (from the past 48 hour(s))  SARS CORONAVIRUS 2 (TAT 6-24 HRS) Nasopharyngeal  Nasopharyngeal Swab     Status: None   Collection Time: 03/23/19  2:25 PM   Specimen: Nasopharyngeal Swab  Result Value Ref Range   SARS Coronavirus 2 NEGATIVE NEGATIVE    Comment: (NOTE) SARS-CoV-2  target nucleic acids are NOT DETECTED. The SARS-CoV-2 RNA is generally detectable in upper and lower respiratory specimens during the acute phase of infection. Negative results do not preclude SARS-CoV-2 infection, do not rule out co-infections with other pathogens, and should not be used as the sole basis for treatment or other patient management decisions. Negative results must be combined with clinical observations, patient history, and epidemiological information. The expected result is Negative. Fact Sheet for Patients: SugarRoll.be Fact Sheet for Healthcare Providers: https://www.woods-mathews.com/ This test is not yet approved or cleared by the [place] FDA and  has been authorized for detection and/or diagnosis of SARS-CoV-2 by FDA under an Emergency Use Authorization (EUA). This EUA will remain  in effect (meaning this test can be used) for the duration of the COVID-19 declaration under Section 56 4(b)(1) of the Act, 21 U.S.C. section 360bbb-3(b)(1), unless the authorization is terminated or revoked sooner. Performed at [facility], [place] [address], [place], [place] [address]    No results found.  Review of Systems  Musculoskeletal: Positive for joint pain.  All other systems reviewed and are negative.   Blood pressure 121/75, pulse 69, temperature 98.1 F (36.7 C), temperature source Oral, resp. rate 20, height 5\' 3"  (1.6 m), weight 81.6 kg, SpO2 98 %. Physical Exam  Constitutional: She is oriented to person, place, and time. She appears well-developed and well-nourished. No distress.  HENT:  Head: Normocephalic and atraumatic.  Right Ear: External ear normal.  Left Ear: External ear normal.  Nose: Nose normal.   Mouth/Throat: Oropharynx is clear and moist.  Eyes: Pupils are equal, round, and reactive to light. Conjunctivae and EOM are normal.  Neck: Normal range of motion. Neck supple.  Cardiovascular: Normal rate.  Respiratory: Effort normal.  Neurological: She is alert and oriented to person, place, and time. No cranial nerve deficit.  Skin: Skin is warm and dry.  Psychiatric: She has a normal mood and affect. Her behavior is normal. Judgment and thought content normal.     Assessment/Plan Obstructive sleep apnea  To OR for hypoglossal nerve stimulator placement.  Melida Quitter, MD 03/25/2019, 9:58 AM

## 2019-03-25 NOTE — Anesthesia Procedure Notes (Signed)
Procedure Name: Intubation Date/Time: 03/25/2019 10:47 AM Performed by: Sanaii Caporaso T, CRNA Pre-anesthesia Checklist: Patient identified, Emergency Drugs available, Suction available and Patient being monitored Patient Re-evaluated:Patient Re-evaluated prior to induction Oxygen Delivery Method: Circle system utilized Preoxygenation: Pre-oxygenation with 100% oxygen Induction Type: IV induction Ventilation: Mask ventilation without difficulty Laryngoscope Size: Miller and 2 Grade View: Grade I Tube type: Oral Tube size: 7.5 mm Number of attempts: 1 Airway Equipment and Method: Patient positioned with wedge pillow and Stylet Placement Confirmation: ETT inserted through vocal cords under direct vision,  positive ETCO2 and breath sounds checked- equal and bilateral Secured at: 21 cm Tube secured with: Tape Dental Injury: Teeth and Oropharynx as per pre-operative assessment

## 2019-03-25 NOTE — Transfer of Care (Signed)
Immediate Anesthesia Transfer of Care Note  Patient: Chaunda Vandergriff  Procedure(s) Performed: IMPLANTATION OF HYPOGLOSSAL NERVE STIMULATOR (Right Chest)  Patient Location: PACU  Anesthesia Type:General  Level of Consciousness: awake, alert  and oriented  Airway & Oxygen Therapy: Patient Spontanous Breathing and Patient connected to nasal cannula oxygen  Post-op Assessment: Report given to RN, Post -op Vital signs reviewed and stable and Patient moving all extremities  Post vital signs: Reviewed and stable  Last Vitals:  Vitals Value Taken Time  BP 138/72 03/25/19 1310  Temp    Pulse 88 03/25/19 1314  Resp 20 03/25/19 1314  SpO2 99 % 03/25/19 1314  Vitals shown include unvalidated device data.  Last Pain:  Vitals:   03/25/19 1311  TempSrc:   PainSc: (P) 0-No pain         Complications: No apparent anesthesia complications

## 2019-03-26 ENCOUNTER — Other Ambulatory Visit: Payer: Self-pay | Admitting: Family Medicine

## 2019-03-26 ENCOUNTER — Encounter (HOSPITAL_COMMUNITY): Payer: Self-pay | Admitting: Otolaryngology

## 2019-03-26 DIAGNOSIS — J309 Allergic rhinitis, unspecified: Secondary | ICD-10-CM

## 2019-03-27 NOTE — Telephone Encounter (Signed)
Okay to refill? Last time rx'd was 2 years ago by Dr. Martinique

## 2019-03-27 NOTE — Telephone Encounter (Signed)
[name] [name] patient, saw Dr. Martinique 2 years ago for an acute visit.

## 2019-04-03 ENCOUNTER — Other Ambulatory Visit: Payer: Self-pay | Admitting: Family Medicine

## 2019-04-04 ENCOUNTER — Other Ambulatory Visit: Payer: Self-pay | Admitting: Family Medicine

## 2019-04-06 ENCOUNTER — Ambulatory Visit
Admission: RE | Admit: 2019-04-06 | Discharge: 2019-04-06 | Disposition: A | Discharge status: Home / Self Care | Payer: PPO | Source: Ambulatory Visit | Attending: Family Medicine | Admitting: Family Medicine

## 2019-04-06 ENCOUNTER — Other Ambulatory Visit: Payer: Self-pay

## 2019-04-06 DIAGNOSIS — Z1231 Encounter for screening mammogram for malignant neoplasm of breast: Secondary | ICD-10-CM

## 2019-04-06 NOTE — Telephone Encounter (Signed)
Copied from [place] [phone]. Topic: General - Other >> Apr 06, 2019  3:04 PM Rayann Heman wrote: Reason for CRM: pharmacy called and stated they will need diagnosis code for medication HYDROcodone-acetaminophen (Herman) 10-325 MG tablet [[id number]]

## 2019-04-07 ENCOUNTER — Other Ambulatory Visit: Payer: Self-pay | Admitting: Family Medicine

## 2019-04-10 ENCOUNTER — Other Ambulatory Visit: Payer: Self-pay | Admitting: Family Medicine

## 2019-04-24 ENCOUNTER — Ambulatory Visit: Payer: PPO | Admitting: Family Medicine

## 2019-05-01 ENCOUNTER — Other Ambulatory Visit: Payer: Self-pay

## 2019-05-01 ENCOUNTER — Ambulatory Visit: Payer: PPO | Admitting: Cardiology

## 2019-05-01 ENCOUNTER — Encounter: Payer: Self-pay | Admitting: Cardiology

## 2019-05-01 VITALS — BP 110/76 | HR 71 | Ht 63.0 in | Wt 177.0 lb

## 2019-05-01 DIAGNOSIS — I10 Essential (primary) hypertension: Secondary | ICD-10-CM | POA: Diagnosis not present

## 2019-05-01 DIAGNOSIS — E669 Obesity, unspecified: Secondary | ICD-10-CM

## 2019-05-01 DIAGNOSIS — G4733 Obstructive sleep apnea (adult) (pediatric): Secondary | ICD-10-CM

## 2019-05-01 NOTE — Progress Notes (Signed)
Cardiology Office Note:    Date:  05/03/2019   ID:  [name] [name], DOB Aug 22, 1955, MRN [id number]  PCP:  Laurey Morale, MD  Cardiologist:  No primary care provider on file.    Referring MD: Laurey Morale, MD   Chief Complaint  Patient presents with  . Sleep Apnea    History of Present Illness:    [name] [name] is a 63 y.o. female with a hx of OSA on CPAP therapy.  Unfortunately she was intolerant to CPAP and requested ENT evaluation of  Inspire. Device.  She underwent hypoglossal nerve stimulator implant by Dr. Redmond Baseman on 03/25/2019.  She is now here today to get her device activated.  She is doing well.  Her surgical wounds have healed nicely.  She is anxious to get her device turned on.  She has been trying to use her CPAP at home for the time being until she gets her device activated but still struggles greatly with her PAP.   Past Medical History:  Diagnosis Date  . Allergy    seasonal  . Anxiety   . Arthritis   . Bell's palsy 2008   pt states she had a 'mini stroke' and nervous breakdown which caused facial drooping  . Bipolar 1 disorder ([place])   . CPAP (continuous positive airway pressure) dependence   . Depression    sees the [facility]   . GERD (gastroesophageal reflux disease)   . Hyperlipidemia   . Hypertension    meds no longer needed  . Memory loss    short term  . Meniere disease   . Obesity (BMI 30-39.9) 11/10/2015  . Post concussion syndrome 07/01/2018  . PPD positive 2007 or 2008  . Sleep apnea    on CPAP therapy  . Urinary incontinence   . Vestibular disorder   . Wears dentures   . Wears glasses     Past Surgical History:  Procedure Laterality Date  . ABDOMINAL HYSTERECTOMY    . ANTERIOR FUSION CERVICAL SPINE  2006   Dr. Saintclair Halsted  . BREAST BIOPSY Right 1981   benign  . BREAST EXCISIONAL BIOPSY Right 1981   benign   scar does not show  . BREAST SURGERY Bilateral 12-11-12   reductions [facility]. Julious [facility] in [facility]   . CATARACT  EXTRACTION, BILATERAL Bilateral last summer of 2018   at [facility]  . COLONOSCOPY  04/25/2016   per Dr. Collene Mares, adenomatous polyps, repeat in 5 yrs   . DRUG INDUCED ENDOSCOPY N/A 01/30/2019   Procedure: DRUG INDUCED ENDOSCOPY;  Surgeon: Melida Quitter, MD;  Location: [place];  Service: ENT;  Laterality: N/A;  . ESOPHAGOGASTRODUODENOSCOPY  04-23-11   per Dr. Collene Mares, normal   . EYE SURGERY  06/18/2016   L eyelid   . IMPLANTATION OF HYPOGLOSSAL NERVE STIMULATOR Right 03/25/2019   Procedure: IMPLANTATION OF HYPOGLOSSAL NERVE STIMULATOR;  Surgeon: Melida Quitter, MD;  Location: [place];  Service: ENT;  Laterality: Right;  . LUMBAR DISC SURGERY  2003   Dr. Saintclair Halsted  . MULTIPLE TOOTH EXTRACTIONS    . ORIF WRIST FRACTURE Right 08/29/2018  . REDUCTION MAMMAPLASTY Bilateral 2013  . SPLIT NIGHT STUDY  11/11/2015  . UVULOPALATOPHARYNGOPLASTY    . VAGINAL HYSTERECTOMY  12/96   secondary to fibroids.    Current Medications: Current Meds  Medication Sig  . acyclovir (ZOVIRAX) 400 MG tablet Take 400 mg by mouth 4 (four) times daily as needed (break outs on nose).  [name]  alprazolam (XANAX) 2 MG tablet TAKE 1 TABLET BY MOUTH 3 TIMES A DAY AS NEEDED (Patient taking differently: Take 2 mg by mouth 3 (three) times daily as needed for anxiety. )  . azelastine (ASTELIN) 0.1 % nasal spray PLACE 2 SPRAYS INTO BOTH NOSTRILS 2 TIMES DAILY. USE IN EACH NOSTRIL AS DIRECTED  . Biotin 5000 MCG TABS Take 5,000 mcg by mouth daily.  . Cholecalciferol (VITAMIN D3) 125 MCG (5000 UT) CAPS Take 5,000 Units by mouth daily.   [name]. FLUoxetine (PROZAC) 10 MG tablet TAKE 2 TABLETS (20 MG TOTAL) BY MOUTH DAILY.  [name]. HYDROcodone-acetaminophen (NORCO/VICODIN) 5-325 MG tablet Take 1 tablet by mouth every 6 (six) hours as needed for moderate pain.  . hydrOXYzine (ATARAX/VISTARIL) 50 MG tablet TAKE 1 TABLET BY MOUTH AT BEDTIME. NEED OFFICE VISIT FOR FURTHER REFILLS  . meloxicam (MOBIC) 15 MG tablet TAKE 1 TABLET BY MOUTH DAILY.  (Patient taking differently: Take 15 mg by mouth daily. )  . Multiple Vitamin (MULTIVITAMIN WITH MINERALS) TABS tablet Take 1 tablet by mouth daily.  [name]. omeprazole (PRILOSEC) 20 MG capsule TAKE 1 CAPSULE BY MOUTH 2 TIMES DAILY BEFORE A MEAL. (Patient taking differently: Take 20 mg by mouth 2 (two) times daily before a meal. )  . polyethylene glycol powder (GLYCOLAX/MIRALAX) powder TAKE 17 GRAMS BY MOUTH 2 TIMES DAILY AS NEEDED. (Patient taking differently: Take 1 Container by mouth daily as needed for moderate constipation. )  . potassium chloride (KLOR-CON) 10 MEQ tablet TAKE 1 TABLET BY MOUTH DAILY.  . Probiotic Product (PROBIOTIC PO) Take 1 capsule by mouth daily.   . simvastatin (ZOCOR) 80 MG tablet TAKE 1 TABLET BY MOUTH AT BEDTIME. (Patient taking differently: Take 80 mg by mouth at bedtime. )     Allergies:   Sulfonamide derivatives, Flexeril [cyclobenzaprine], Morphine and related, and Sulfamethoxazole   Social History   Socioeconomic History  . Marital status: Divorced    Spouse name: Not on file  . Number of children: 2  . Years of education: Not on file  . Highest education level: Associate degree: academic program  Occupational History  . Occupation: Disabled  Social Needs  . Financial resource strain: Not very hard  . Food insecurity    Worry: Never true    Inability: Never true  . Transportation needs    Medical: No    Non-medical: No  Tobacco Use  . Smoking status: Light Tobacco Smoker    Years: 30.00    Types: Cigarettes  . Smokeless tobacco: Never Used  . Tobacco comment: " 1 cigarette every 2 days- maybe "  Substance and Sexual Activity  . Alcohol use: No    Alcohol/week: 0.0 standard drinks  . Drug use: No  . Sexual activity: Not Currently    Partners: Male    Birth control/protection: Surgical  Lifestyle  . Physical activity    Days per week: 0 days    Minutes per session: 0 min  . Stress: To some extent  Relationships  . Social Musicianconnections    Talks  on phone: Three times a week    Gets together: Once a week    Attends religious service: 1 to 4 times per year    Active member of club or organization: Yes    Attends meetings of clubs or organizations: 1 to 4 times per year    Relationship status: Divorced  Other Topics Concern  . Not on file  Social History Narrative   [place] lives in [place], [place].  Right handed    Occasional use of caffeine   Lives with ex-husband         07/10/2018: Lives with ex-husband on one-level house. Divorced, according to husband, for financial benefit only    Has two children, one local and one in [place]. Has several grandchildren locally who can help her occasionally if needed.     Family History: The patient's family history includes Alcohol abuse in an other family member; Arthritis in an other family member; Asthma in her father; Coronary artery disease in her brother, sister, and another family member; Dementia in her mother; Depression in an other family member; Diabetes in an other family member; Hyperlipidemia in her brother, mother, sister, sister, and another family member; Hypertension in her brother, sister, sister, and another family member; Sudden death in an other family member; Thyroid disease in her sister.  ROS:   Please see the history of present illness.    ROS  All other systems reviewed and negative.   EKGs/Labs/Other Studies Reviewed:    The following studies were reviewed today: Op note for Inspire  EKG:  EKG is not ordered today.    Recent Labs: 03/23/2019: BUN 9; Creatinine, Ser 0.76; Hemoglobin 12.6; Platelets 157; Potassium 4.2; Sodium 138   Recent Lipid Panel    Component Value Date/Time   CHOL 235 (H) 03/10/2018 1035   TRIG 246.0 (H) 03/10/2018 1035   HDL 47.30 03/10/2018 1035   CHOLHDL 5 03/10/2018 1035   VLDL 49.2 (H) 03/10/2018 1035   LDLCALC 149 (H) 11/08/2017 1634   LDLDIRECT 146.0 03/10/2018 1035    Physical Exam:    VS:  BP 110/76   Pulse 71   Ht  (1.6  m)   Wt 177 lb (80.3 kg)   SpO2 96%   BMI 31.35 kg/m     Wt Readings from Last 3 Encounters:  05/01/19 177 lb (80.3 kg)  03/25/19 180 lb (81.6 kg)  03/23/19 180 lb 4.8 oz (81.8 kg)     GEN:  Well nourished, well developed in no acute distress HEENT: Normal NECK: No JVD; No carotid bruits LYMPHATICS: No lymphadenopathy CARDIAC: RRR, no murmurs, rubs, gallops RESPIRATORY:  Clear to auscultation without rales, wheezing or rhonchi  ABDOMEN: Soft, non-tender, non-distended MUSCULOSKELETAL:  No edema; No deformity  SKIN: Warm and dry NEUROLOGIC:  Alert and oriented x 3 PSYCHIATRIC:  Normal affect   ASSESSMENT:    1. Obstructive sleep apnea syndrome   2. Essential hypertension   3. Obesity (BMI 30-39.9)    PLAN:    In order of problems listed above:  1.  OSA -moderate OSA with AHI 25.5/hr in 2017.  She has been intolerant to PAP therapy and is now s/p hypoglossal nerve stimulator placement. -surgical wounds are well healed.  -still using CPAP at night for now and continues to struggle with it -I have told her she can stop her PAP device now that she is getting her Inspire device activated -Activation of her device was carried out with the Inspire rep at today's OV with following data: -Threshold testing: Sensation: 0.5V and Functional 0.8V (tongue protrusion) -Stimulation settings:  Amplitude 0.8V with patient control 0.8-1.8V    Pulse width 90ms    Rate     Start delay 45 min (she has prolonged onset to sleep)    Pause time 30 min    Therapy duration 8 hours    Electrodes reversed  -she will be called in 4 weeks to evaluate progress and  then in lab sleep study in 3 months to assess AHI and fine tune device settings  2.  HTN -BP controlled on exam -she has not required antihypertensive meds  3.  Obesity -I have encouraged her to get into a routine exercise program and cut back on carbs and portions.   I have spent a total of 45 minutes with patient including  interviewing patient, PE and Inspire device interrogation with programming.   Medication Adjustments/Labs and Tests Ordered: Current medicines are reviewed at length with the patient today.  Concerns regarding medicines are outlined above.  No orders of the defined types were placed in this encounter.  No orders of the defined types were placed in this encounter.   Signed, Armanda Magic, MD  05/03/2019 1:35 PM    [facility]

## 2019-05-01 NOTE — Patient Instructions (Signed)
Medication Instructions:   Your physician recommends that you continue on your current medications as directed. Please refer to the Current Medication list given to you today.  *If you need a refill on your cardiac medications before your next appointment, please call your pharmacy*    Follow-Up:  OUR SLEEP COORDINATOR NINA JONES WILL BE CALLING YOU SOON TO ARRANGE ALL YOUR FOLLOW-UP APPOINTMENTS.  HER DIRECT CONTACT INFORMATION IS [phone].

## 2019-05-02 ENCOUNTER — Other Ambulatory Visit: Payer: Self-pay | Admitting: Family Medicine

## 2019-05-07 ENCOUNTER — Other Ambulatory Visit: Payer: Self-pay

## 2019-05-08 ENCOUNTER — Ambulatory Visit (INDEPENDENT_AMBULATORY_CARE_PROVIDER_SITE_OTHER): Payer: PPO | Admitting: Family Medicine

## 2019-05-08 ENCOUNTER — Encounter: Payer: Self-pay | Admitting: Family Medicine

## 2019-05-08 DIAGNOSIS — G8929 Other chronic pain: Secondary | ICD-10-CM

## 2019-05-08 DIAGNOSIS — F1190 Opioid use, unspecified, uncomplicated: Secondary | ICD-10-CM

## 2019-05-08 DIAGNOSIS — M5440 Lumbago with sciatica, unspecified side: Secondary | ICD-10-CM

## 2019-05-08 DIAGNOSIS — Z23 Encounter for immunization: Secondary | ICD-10-CM | POA: Diagnosis not present

## 2019-05-08 MED ORDER — HYDROMORPHONE HCL 8 MG PO TABS: 8.0000 mg | ORAL_TABLET | Freq: Four times a day (QID) | ORAL | 0 refills | Status: DC | PRN

## 2019-05-08 NOTE — Progress Notes (Signed)
   Subjective:    HPI Here for pain management. She has had more pain recently and the Hydrocodone does not work as well as it used to. She wants to switch to something stronger. She has been fitted with an Concrete system for her sleep apnea, and she is resting better than she has for years. She has lost a little weight.  Indication for chronic opioid: low back pain  Medication and dose: Norco 10-325 # pills per month: 120 Last UDS date: 05-08-19 Opioid Treatment Agreement signed (Y/N): 09-17-17 Opioid Treatment Agreement last reviewed with patient:  05-08-19 [facility] reviewed this encounter (include red flags): Yes    Review of Systems  Constitutional: Negative.   Respiratory: Negative.   Cardiovascular: Negative.   Musculoskeletal: Positive for back pain.       Objective:   Physical Exam Constitutional:      Appearance: Normal appearance.  Neurological:     Mental Status: She is alert.           Assessment & Plan:  Pain management, we agreed to stop Norco and try Dilaudid for one month. Recheck in 4 weeks.  Alysia Penna, MD

## 2019-05-10 LAB — PAIN MGMT, PROFILE 8 W/CONF, U
6 Acetylmorphine: NEGATIVE ng/mL
Alcohol Metabolites: NEGATIVE ng/mL (ref <500)
Alphahydroxyalprazolam: 696 ng/mL
Alphahydroxymidazolam: NEGATIVE ng/mL
Alphahydroxytriazolam: NEGATIVE ng/mL
Aminoclonazepam: NEGATIVE ng/mL
Amphetamines: NEGATIVE ng/mL
Benzodiazepines: POSITIVE ng/mL
Buprenorphine, Urine: NEGATIVE ng/mL
Cocaine Metabolite: NEGATIVE ng/mL
Codeine: NEGATIVE ng/mL
Creatinine: 68.3 mg/dL
Hydrocodone: 2108 ng/mL
Hydromorphone: 140 ng/mL
Hydroxyethylflurazepam: NEGATIVE ng/mL
Lorazepam: NEGATIVE ng/mL
MDMA: NEGATIVE ng/mL
Marijuana Metabolite: NEGATIVE ng/mL
Morphine: NEGATIVE ng/mL
Nordiazepam: NEGATIVE ng/mL
Norhydrocodone: 3539 ng/mL
Opiates: POSITIVE ng/mL
Oxazepam: NEGATIVE ng/mL
Oxidant: NEGATIVE ug/mL
Oxycodone: NEGATIVE ng/mL
Temazepam: NEGATIVE ng/mL
pH: 5.6 (ref 4.5–9.0)

## 2019-05-11 ENCOUNTER — Encounter: Payer: Self-pay | Admitting: Family Medicine

## 2019-05-12 ENCOUNTER — Telehealth: Payer: Self-pay | Admitting: *Deleted

## 2019-05-12 NOTE — Telephone Encounter (Signed)
Dx given to [facility]. Nothing further needed.

## 2019-05-12 NOTE — Telephone Encounter (Signed)
Copied from [place] [id number]. Topic: General - Inquiry >> May 12, 2019 12:59 PM Richardo Priest, [place] wrote: Reason for CRM: Pharmacy called in stating they are needing to have a diagnosis code for script sent 11/20. Please advise.

## 2019-05-13 MED ORDER — HYDROXYZINE HCL 50 MG PO TABS: ORAL_TABLET | ORAL | 11 refills | Status: DC

## 2019-05-13 MED ORDER — HYDROCODONE-ACETAMINOPHEN 10-325 MG PO TABS: 1.0000 | ORAL_TABLET | Freq: Four times a day (QID) | ORAL | 0 refills | Status: AC | PRN

## 2019-05-13 NOTE — Telephone Encounter (Signed)
I sent in refills for Norco and Hydroxyzine. Please call her pharmacy and explain that she will not be taking the Hydromorphone

## 2019-05-19 DIAGNOSIS — Z1231 Encounter for screening mammogram for malignant neoplasm of breast: Secondary | ICD-10-CM | POA: Diagnosis not present

## 2019-05-20 NOTE — Telephone Encounter (Signed)
Last OV 05/08/19 Last refill 05/13/19 #120/0 Next OV not scheduled

## 2019-05-25 ENCOUNTER — Ambulatory Visit: Payer: PPO

## 2019-06-03 ENCOUNTER — Other Ambulatory Visit: Payer: Self-pay

## 2019-06-03 ENCOUNTER — Ambulatory Visit (INDEPENDENT_AMBULATORY_CARE_PROVIDER_SITE_OTHER): Payer: PPO | Admitting: Cardiology

## 2019-06-03 ENCOUNTER — Encounter: Payer: Self-pay | Admitting: Cardiology

## 2019-06-03 VITALS — BP 130/82 | HR 67 | Ht 63.0 in | Wt 180.1 lb

## 2019-06-03 DIAGNOSIS — E669 Obesity, unspecified: Secondary | ICD-10-CM

## 2019-06-03 DIAGNOSIS — G4733 Obstructive sleep apnea (adult) (pediatric): Secondary | ICD-10-CM

## 2019-06-03 DIAGNOSIS — I10 Essential (primary) hypertension: Secondary | ICD-10-CM | POA: Diagnosis not present

## 2019-06-03 NOTE — Progress Notes (Signed)
Cardiology Office Note:    Date:  06/03/2019   ID:  [name] [name], DOB 17-Jun-1956, MRN [id number]  PCP:  Laurey Morale, MD  Cardiologist:  No primary care provider on file.    Referring MD: Laurey Morale, MD   Chief Complaint  Patient presents with  . Sleep Apnea    History of Present Illness:    [name] [name] is a 63 y.o. female with a hx of OSA on CPAP therapy.  Unfortunately she was intolerant to CPAP and requested ENT evaluation of  Inspire. Device.  She underwent hypoglossal nerve stimulator implant by Dr. Redmond Baseman on 03/25/2019.  She was seen back on 05/01/2019 for device activation.  She had her 4 week check after activation recently and complained that she was having problems with her tongue burning.  She tells me that this occurs at night when she gets up to go to the bathroom or just wakes up and then she feels discomfort in her tongue as it is stimulated and moves forward.  She has not been pausing her device to start over when she wakes up so she is awake during stimulation of the hypoglossal nerve.  She has noticed a significant improvement in her daytime sleepiness.  She feels rested when she wakes up.  She does still snore some.  She is on a level #7 currently.  She has not had her sleep study done at the sleep lab yet.  Past Medical History:  Diagnosis Date  . Allergy    seasonal  . Anxiety   . Arthritis   . Bell's palsy 2008   pt states she had a 'mini stroke' and nervous breakdown which caused facial drooping  . Bipolar 1 disorder ([place])   . CPAP (continuous positive airway pressure) dependence   . Depression    sees the [facility]   . GERD (gastroesophageal reflux disease)   . Hyperlipidemia   . Hypertension    meds no longer needed  . Memory loss    short term  . Meniere disease   . Obesity (BMI 30-39.9) 11/10/2015  . Post concussion syndrome 07/01/2018  . PPD positive 2007 or 2008  . Sleep apnea    on CPAP therapy  . Urinary incontinence     . Vestibular disorder   . Wears dentures   . Wears glasses     Past Surgical History:  Procedure Laterality Date  . ABDOMINAL HYSTERECTOMY    . ANTERIOR FUSION CERVICAL SPINE  2006   Dr. Saintclair Halsted  . BREAST BIOPSY Right 1981   benign  . BREAST EXCISIONAL BIOPSY Right 1981   benign   scar does not show  . BREAST SURGERY Bilateral 12-11-12   red[facility] Julious [facility]   . CATARACT EXTRACTION, BILATERAL Bilateral last summer of 2018   at [facility]  . COLONOSCOPY  04/25/2016   per Dr. Collene Mares, adenomatous polyps, repeat in 5 yrs   . DRUG INDUCED ENDOSCOPY N/A 01/30/2019   Procedure: DRUG INDUCED ENDOSCOPY;  Surgeon: Melida Quitter, MD;  Location: [place];  Service: ENT;  Laterality: N/A;  . ESOPHAGOGASTRODUODENOSCOPY  04-23-11   per Dr. Collene Mares, normal   . EYE SURGERY  06/18/2016   L eyelid   . IMPLANTATION OF HYPOGLOSSAL NERVE STIMULATOR Right 03/25/2019   Procedure: IMPLANTATION OF HYPOGLOSSAL NERVE STIMULATOR;  Surgeon: Melida Quitter, MD;  Location: [place];  Service: ENT;  Laterality: Right;  . [place] SURGERY  2003  Dr. Wynetta Emery  . MULTIPLE TOOTH EXTRACTIONS    . ORIF WRIST FRACTURE Right 08/29/2018  . REDUCTION MAMMAPLASTY Bilateral 2013  . SPLIT NIGHT STUDY  11/11/2015  . UVULOPALATOPHARYNGOPLASTY    . VAGINAL HYSTERECTOMY  12/96   secondary to fibroids.    Current Medications: Current Meds  Medication Sig  . acyclovir (ZOVIRAX) 400 MG tablet Take 400 mg by mouth 4 (four) times daily as needed (break outs on nose).  [name] alprazolam (XANAX) 2 MG tablet Take 2 mg by mouth 3 (three) times daily as needed for sleep.  [name] azelastine (ASTELIN) 0.1 % nasal spray PLACE 2 SPRAYS INTO BOTH NOSTRILS 2 TIMES DAILY. USE IN EACH NOSTRIL AS DIRECTED  . Biotin 5000 MCG TABS Take 5,000 mcg by mouth daily.  . Cholecalciferol (VITAMIN D3) 125 MCG (5000 UT) CAPS Take 5,000 Units by mouth daily.   [name] FLUoxetine (PROZAC) 10 MG tablet TAKE 2 TABLETS (20 MG TOTAL) BY  MOUTH DAILY.  [name] HYDROcodone-acetaminophen (NORCO) 10-325 MG tablet Take 1 tablet by mouth every 6 (six) hours as needed for severe pain.  . hydrOXYzine (ATARAX/VISTARIL) 50 MG tablet TAKE 1 TABLET BY MOUTH AT BEDTIME. NEED OFFICE VISIT FOR FURTHER REFILLS  . meloxicam (MOBIC) 15 MG tablet Take 15 mg by mouth daily.  . Multiple Vitamin (MULTIVITAMIN WITH MINERALS) TABS tablet Take 1 tablet by mouth daily.  [name] omeprazole (PRILOSEC) 20 MG capsule Take 20 mg by mouth 2 (two) times daily before a meal.  . polyethylene glycol (MIRALAX / GLYCOLAX) 17 g packet Take 17 g by mouth daily as needed.  . potassium chloride (KLOR-CON) 10 MEQ tablet TAKE 1 TABLET BY MOUTH DAILY.  . Probiotic Product (PROBIOTIC PO) Take 1 capsule by mouth daily.   . simvastatin (ZOCOR) 80 MG tablet Take 80 mg by mouth daily.  . vitamin E 400 UNIT capsule Take 400 Units by mouth daily.     Allergies:   Sulfonamide derivatives, Flexeril [cyclobenzaprine], Morphine and related, and Sulfamethoxazole   Social History   Socioeconomic History  . Marital status: Divorced    Spouse name: Not on file  . Number of children: 2  . Years of education: Not on file  . Highest education level: Associate degree: academic program  Occupational History  . Occupation: Disabled  Tobacco Use  . Smoking status: Light Tobacco Smoker    Years: 30.00    Types: Cigarettes  . Smokeless tobacco: Never Used  . Tobacco comment: " 1 cigarette every 2 days- maybe "  Substance and Sexual Activity  . Alcohol use: No    Alcohol/week: 0.0 standard drinks  . Drug use: No  . Sexual activity: Not Currently    Partners: Male    Birth control/protection: Surgical  Other Topics Concern  . Not on file  Social History Narrative   Pt lives in [place], [place].   Right handed    Occasional use of caffeine   Lives with ex-husband         07/10/2018: Lives with ex-husband on one-level house. Divorced, according to husband, for financial benefit only    Has two  children, one local and one in [place]. Has several grandchildren locally who can help her occasionally if needed.   Social Determinants of Health   Financial Resource Strain: Low Risk   . Difficulty of Paying Living Expenses: Not very hard  Food Insecurity: No Food Insecurity  . Worried About Programme researcher, broadcasting/film/video in the Last Year: Never true  . Ran Out  of Food in the Last Year: Never true  Transportation Needs: No Transportation Needs  . Lack of Transportation (Medical): No  . Lack of Transportation (Non-Medical): No  Physical Activity: Inactive  . Days of Exercise per Week: 0 days  . Minutes of Exercise per Session: 0 min  Stress: Stress Concern Present  . Feeling of Stress : To some extent  Social Connections: Slightly Isolated  . Frequency of Communication with Friends and Family: Three times a week  . Frequency of Social Gatherings with Friends and Family: Once a week  . Attends Religious Services: 1 to 4 times per year  . Active Member of Clubs or Organizations: Yes  . Attends Banker Meetings: 1 to 4 times per year  . Marital Status: Divorced     Family History: The patient's family history includes Alcohol abuse in an other family member; Arthritis in an other family member; Asthma in her father; Coronary artery disease in her brother, sister, and another family member; Dementia in her mother; Depression in an other family member; Diabetes in an other family member; Hyperlipidemia in her brother, mother, sister, sister, and another family member; Hypertension in her brother, sister, sister, and another family member; Sudden death in an other family member; Thyroid disease in her sister.  ROS:   Please see the history of present illness.    ROS  All other systems reviewed and negative.   EKGs/Labs/Other Studies Reviewed:    The following studies were reviewed today: none  EKG:  EKG is not ordered today.    Recent Labs: 03/23/2019: BUN 9; Creatinine, Ser 0.76;  Hemoglobin 12.6; Platelets 157; Potassium 4.2; Sodium 138   Recent Lipid Panel    Component Value Date/Time   CHOL 235 (H) 03/10/2018 1035   TRIG 246.0 (H) 03/10/2018 1035   HDL 47.30 03/10/2018 1035   CHOLHDL 5 03/10/2018 1035   VLDL 49.2 (H) 03/10/2018 1035   LDLCALC 149 (H) 11/08/2017 1634   LDLDIRECT 146.0 03/10/2018 1035    Physical Exam:    VS:  BP 130/82   Pulse 67   Ht 5\' 3"  (1.6 m)   Wt 180 lb 1.9 oz (81.7 kg)   SpO2 97%   BMI 31.91 kg/m     Wt Readings from Last 3 Encounters:  06/03/19 180 lb 1.9 oz (81.7 kg)  05/01/19 177 lb (80.3 kg)  03/25/19 180 lb (81.6 kg)     GEN:  Well nourished, well developed in no acute distress HEENT: Normal NECK: No JVD; No carotid bruits LYMPHATICS: No lymphadenopathy CARDIAC: RRR, no murmurs, rubs, gallops RESPIRATORY:  Clear to auscultation without rales, wheezing or rhonchi  ABDOMEN: Soft, non-tender, non-distended MUSCULOSKELETAL:  No edema; No deformity  SKIN: Warm and dry NEUROLOGIC:  Alert and oriented x 3 PSYCHIATRIC:  Normal affect   ASSESSMENT:    1. Obstructive sleep apnea syndrome   2. Essential hypertension   3. Obesity (BMI 30-39.9)    PLAN:    In order of problems listed above:  1.  OSA -intolerant to CPAP therapy -s/p hypoglossal nerve stimulator -she is doing very well with her device -she has noticed improvement in daytime sleepiness although she still does nap some during the day -she feels rested when she wakes up -it is normal for her to sleep 10 hours and would like the device programmed to allow more sleep time  -Her initial delay to sleep onset was 45 minutes but would like that decreased as she falls asleep quickly.   -  she has been having some problems with tongue soreness from hypoglossal N stimulation when she wakes up at night but has not been pausing her device.  -The Inspire rep was present for device interrogation and device is workin well -the following changes were made to the  device  *delay in onset to sleep initiation decreased from 45 to 30 minutes  *Prolonged sleep time therapy taken out to 12 hours and patient understands that she will need to program the device off if she does not sleep that long.    *she was instructed to pause the device if she wakes up at night to allow it to restart once she is back asleep. -she will be set up for sleep study in the sleep lab in February for fine tuning and see me back in 1 week after sleep study  2.  HTN -BP controlled on exam -has not required any antihypertensive therapy  3.  Obesity -I have encouraged her to get into a routine exercise program and cut back on carbs and portions.    Medication Adjustments/Labs and Tests Ordered: Current medicines are reviewed at length with the patient today.  Concerns regarding medicines are outlined above.  Orders Placed This Encounter  Procedures  . EKG 12-Lead   No orders of the defined types were placed in this encounter.   Signed, Armanda Magicraci Shann Lewellyn, MD  06/03/2019 8:56 PM    [facility]

## 2019-06-10 ENCOUNTER — Other Ambulatory Visit: Payer: Self-pay | Admitting: Family Medicine

## 2019-06-10 NOTE — Telephone Encounter (Signed)
Last filled 05/13/2019 Last OV 05/08/2019  Ok to fill?

## 2019-06-22 ENCOUNTER — Other Ambulatory Visit: Payer: Self-pay | Admitting: Family Medicine

## 2019-06-22 NOTE — Telephone Encounter (Signed)
Last OV 05/08/2019 Last filled 05/13/2019  Ok to fill?

## 2019-06-25 ENCOUNTER — Ambulatory Visit: Payer: Self-pay | Admitting: *Deleted

## 2019-06-25 ENCOUNTER — Telehealth: Payer: Self-pay

## 2019-06-25 MED ORDER — HYDROCODONE-ACETAMINOPHEN 10-325 MG PO TABS: 1.0000 | ORAL_TABLET | Freq: Four times a day (QID) | ORAL | 0 refills | Status: AC | PRN

## 2019-06-25 NOTE — Addendum Note (Signed)
Addended by: Gershon Crane A on: 06/25/2019 01:16 PM   Modules accepted: Orders

## 2019-06-25 NOTE — Telephone Encounter (Signed)
I sent in for #20 Norco. This HAS to last her until the OV, so use them wisely

## 2019-06-25 NOTE — Telephone Encounter (Signed)
Pt called back with recommendations for pain control; per her inititial encounter, "Pt has bone spurs and degenerative disc disease, she is in a lot of pain and would like to speak to a nurse about pain medication options before her drug screening scheduled for 07/01/2019"; the pt says she is allergic to dilaudid, and morphine; explained to pt this will be sent to her provider for review; she verbalized understanding, and can be contacted at [phone]; the pt sees Dr Clent Ridges, Georgena Spurling; will route to office for final disposition.  Reason for Disposition . [1] Caller requesting NON-URGENT health information AND [2] PCP's office is the best resource  Answer Assessment - Initial Assessment Questions 1. REASON FOR CALL or QUESTION: "What is your reason for calling today?" or "How can I best help you?" or "What question do you have that I can help answer?"     Pain managment  Protocols used: INFORMATION ONLY CALL - NO TRIAGE-A-AH

## 2019-06-25 NOTE — Telephone Encounter (Signed)
Patient is aware 

## 2019-06-25 NOTE — Telephone Encounter (Signed)
Message Routed to PCP CMA 

## 2019-06-25 NOTE — Telephone Encounter (Signed)
Copied from CRM [id number]. Topic: General - Call Back - No Documentation >> Jun 25, 2019 11:36 AM Randol Kern wrote: Reason for CRM: Pt is requesting a call back from a nurse, she is seeking drug advice Best contact: [phone]

## 2019-06-25 NOTE — Telephone Encounter (Signed)
Patient stated that she is out of her Oxycodone. Patient has an appointment on 06/30/2018. She would like to know what she should take in the mean time.

## 2019-06-30 ENCOUNTER — Other Ambulatory Visit: Payer: Self-pay

## 2019-06-30 ENCOUNTER — Telehealth: Payer: Self-pay | Admitting: *Deleted

## 2019-06-30 DIAGNOSIS — G4733 Obstructive sleep apnea (adult) (pediatric): Secondary | ICD-10-CM

## 2019-06-30 NOTE — Telephone Encounter (Signed)
-----   Message from Reesa Chew, CMA sent at 05/07/2019  2:46 PM EST ----- Regarding: APPT MAKE 6 WEEK CALL IN APPT. 12/11 = 4 WEEKS OR 12/18 = 6 WEEKS

## 2019-06-30 NOTE — Telephone Encounter (Signed)
Are you using Inspire every night? Yes, and naps  How many steps have you stepped up? 3 now up to 7  Are you more rested during the day? yes

## 2019-07-01 ENCOUNTER — Ambulatory Visit (INDEPENDENT_AMBULATORY_CARE_PROVIDER_SITE_OTHER): Payer: PPO | Admitting: Family Medicine

## 2019-07-01 ENCOUNTER — Encounter: Payer: Self-pay | Admitting: Family Medicine

## 2019-07-01 VITALS — BP 130/70 | HR 70 | Temp 97.2°F | Ht 63.0 in | Wt 178.0 lb

## 2019-07-01 DIAGNOSIS — F900 Attention-deficit hyperactivity disorder, predominantly inattentive type: Secondary | ICD-10-CM

## 2019-07-01 DIAGNOSIS — M5440 Lumbago with sciatica, unspecified side: Secondary | ICD-10-CM

## 2019-07-01 DIAGNOSIS — G8929 Other chronic pain: Secondary | ICD-10-CM | POA: Diagnosis not present

## 2019-07-01 MED ORDER — OXYCODONE HCL 10 MG PO TABS: 10.0000 mg | ORAL_TABLET | Freq: Four times a day (QID) | ORAL | 0 refills | Status: AC | PRN

## 2019-07-01 MED ORDER — AMPHETAMINE-DEXTROAMPHETAMINE 10 MG PO TABS: 10.0000 mg | ORAL_TABLET | Freq: Every day | ORAL | 0 refills | Status: DC

## 2019-07-01 NOTE — Progress Notes (Signed)
   Subjective:    Patient ID: [name] [name], female    DOB: 1956-03-28, 64 y.o.   MRN: [id number]  HPI Here for several issues. First she wants to change her pain medication. Out last pain management visit was on 05-08-19, and she wanted to try Dilaudid instead of Norco. However this caused intense itching so she stopped it after 3 days. Then we went back to [facility] for awhile. She is disappointed with her pain relief on this however, and she gets nervous about how much acetaminophen she takes every day. She has done well on Percocet in the past. Second she wants to get back on a medication for her ADHD again. She had taken Adderall XR in the past with good results, but this seemed to stay in her system too long and it interfered with her sleep at night.    Review of Systems  Constitutional: Negative.   Respiratory: Negative.   Cardiovascular: Negative.   Musculoskeletal: Positive for back pain.  Psychiatric/Behavioral: Positive for decreased concentration.       Objective:   Physical Exam Constitutional:      Appearance: Normal appearance.  Cardiovascular:     Rate and Rhythm: Normal rate and regular rhythm.     Pulses: Normal pulses.     Heart sounds: Normal heart sounds.  Pulmonary:     Effort: Pulmonary effort is normal.     Breath sounds: Normal breath sounds.  Neurological:     General: No focal deficit present.     Mental Status: She is alert and oriented to person, place, and time.           Assessment & Plan:  For pain control, she will stop Norco and try Oxycodone 10 mg every 6 hours as needed. She can then add Tylenol if she feels she needs it. For the ADHD, she will try immediate release Adderall 10 mg each morning. Follow up on both issues in one month. Gershon Crane, MD

## 2019-07-14 ENCOUNTER — Ambulatory Visit (INDEPENDENT_AMBULATORY_CARE_PROVIDER_SITE_OTHER): Payer: PPO

## 2019-07-14 VITALS — BP 112/70 | Ht 62.0 in | Wt 172.0 lb

## 2019-07-14 DIAGNOSIS — Z0000 Encounter for general adult medical examination without abnormal findings: Secondary | ICD-10-CM | POA: Diagnosis not present

## 2019-07-14 NOTE — Progress Notes (Signed)
This visit is being conducted via phone call due to the COVID-19 pandemic. This patient has given me verbal consent via phone to conduct this visit, patient states they are participating from their home address. Some vital signs may be absent or patient reported.   Patient identification: identified by name, DOB, and current address.  Location provider: [facility], Office Persons participating in the virtual visit: Ms. Moneka Mcquinn and Nathaniel Man, LPN.    Subjective:   [name] [name] is a 64 y.o. female who presents for Medicare Annual (Subsequent) preventive examination.  Review of Systems:  No ROS; Annual Medicare Wellness Visit  Cardiac Risk Factors include: advanced age (>65men, >5 women);dyslipidemia;sedentary lifestyle     Objective:    Vitals: BP 112/70 Comment: patient reported  Ht 5\' 2"  (1.575 m)   Wt 172 lb (78 kg)   BMI 31.46 kg/m   Body mass index is 31.46 kg/m.  Advanced Directives 07/14/2019 03/23/2019 01/30/2019 07/25/2018 07/21/2018 07/10/2018 06/29/2018  Does Patient Have a Medical Advance Directive? No Yes Yes No No (No Data) No  Type of Advance Directive - Living will Healthcare Power of [place];Living will - - - -  Does patient want to make changes to medical advance directive? - No - Patient declined No - Patient declined - - - -  Copy of Healthcare Power of Attorney in Chart? - - No - copy requested - - - -  Would patient like information on creating a medical advance directive? Yes (MAU/Ambulatory/Procedural Areas - Information given) - - No - Patient declined No - Patient declined - -    Tobacco Social History   Tobacco Use  Smoking Status Light Tobacco Smoker  . Years: 30.00  . Types: Cigarettes  Smokeless Tobacco Never Used  Tobacco Comment   " 1 cigarette every 2 days- maybe "     Ready to quit: Yes Counseling given: Yes Comment: " 1 cigarette every 2 days- maybe "   Clinical Intake:  Pre-visit preparation completed: Yes         BMI - recorded: 31.46 Nutritional Status: BMI > 30  Obese Nutritional Risks: None Diabetes: No  How often do you need to have someone help you when you read instructions, pamphlets, or other written materials from your doctor or pharmacy?: 1 - Never What is the last grade level you completed in school?: college  Interpreter Needed?: No  Information entered by :: [id number], LPN.  Past Medical History:  Diagnosis Date  . Allergy    seasonal  . Anxiety   . Arthritis   . Bell's palsy 2008   pt states she had a 'mini stroke' and nervous breakdown which caused facial drooping  . Bipolar 1 disorder (HCC)   . CPAP (continuous positive airway pressure) dependence   . Depression    sees the [facility]   . GERD (gastroesophageal reflux disease)   . Hyperlipidemia   . Hypertension    meds no longer needed  . Memory loss    short term  . Meniere disease   . Obesity (BMI 30-39.9) 11/10/2015  . Post concussion syndrome 07/01/2018  . PPD positive 2007 or 2008  . Sleep apnea    on CPAP therapy  . Urinary incontinence   . Vestibular disorder   . Wears dentures   . Wears glasses    Past Surgical History:  Procedure Laterality Date  . ABDOMINAL HYSTERECTOMY    . ANTERIOR FUSION CERVICAL SPINE  2006   Dr. 2007  .  BREAST BIOPSY Right 1981   benign  . BREAST EXCISIONAL BIOPSY Right 1981   benign   scar does not show  . BREAST SURGERY Bilateral 12-11-12   reductions [facility] Dr. Ivin Booty in [facility]   . CATARACT EXTRACTION, BILATERAL Bilateral last summer of 2018   at [facility]  . COLONOSCOPY  04/25/2016   per Dr. Loreta Ave, adenomatous polyps, repeat in 5 yrs   . DRUG INDUCED ENDOSCOPY N/A 01/30/2019   Procedure: DRUG INDUCED ENDOSCOPY;  Surgeon: Christia Reading, MD;  Location: [facility];  Service: ENT;  Laterality: N/A;  . ESOPHAGOGASTRODUODENOSCOPY  04-23-11   per Dr. Loreta Ave, normal   . EYE SURGERY  06/18/2016   L eyelid   . IMPLANTATION OF HYPOGLOSSAL  NERVE STIMULATOR Right 03/25/2019   Procedure: IMPLANTATION OF HYPOGLOSSAL NERVE STIMULATOR;  Surgeon: Christia Reading, MD;  Location: [facility] OR;  Service: ENT;  Laterality: Right;  . LUMBAR DISC SURGERY  2003   Dr. Wynetta Emery  . MULTIPLE TOOTH EXTRACTIONS    . ORIF WRIST FRACTURE Right 08/29/2018  . REDUCTION MAMMAPLASTY Bilateral 2013  . SPLIT NIGHT STUDY  11/11/2015  . UVULOPALATOPHARYNGOPLASTY    . VAGINAL HYSTERECTOMY  12/96   secondary to fibroids.   Family History  Problem Relation Age of Onset  . Dementia Mother   . Hyperlipidemia Mother   . Asthma Father   . Hypertension Sister   . Hyperlipidemia Sister   . Coronary artery disease Sister   . Thyroid disease Sister   . Hypertension Brother   . Hyperlipidemia Brother   . Coronary artery disease Brother   . Hypertension Sister   . Hyperlipidemia Sister   . Alcohol abuse Other        fhx  . Arthritis Other        fhx  . Coronary artery disease Other        fhx  . Depression Other        fhx  . Diabetes Other        fhx  . Hyperlipidemia Other        fhx  . Hypertension Other        fhx  . Sudden death Other        fhx   Social History   Socioeconomic History  . Marital status: Divorced    Spouse name: Not on file  . Number of children: 2  . Years of education: Not on file  . Highest education level: Associate degree: academic program  Occupational History  . Occupation: Disabled  Tobacco Use  . Smoking status: Light Tobacco Smoker    Years: 30.00    Types: Cigarettes  . Smokeless tobacco: Never Used  . Tobacco comment: " 1 cigarette every 2 days- maybe "  Substance and Sexual Activity  . Alcohol use: No    Alcohol/week: 0.0 standard drinks  . Drug use: No  . Sexual activity: Not Currently    Partners: Male    Birth control/protection: Surgical  Other Topics Concern  . Not on file  Social History Narrative   Pt lives in [place], [place].   Right handed    Occasional use of caffeine   Lives with ex-husband          07/10/2018: Lives with ex-husband on one-level house. Divorced, according to husband, for financial benefit only    Has two children, one local and one in [place]. Has several grandchildren locally who can help her occasionally if needed.   Social  Determinants of Health   Financial Resource Strain: Low Risk   . Difficulty of Paying Living Expenses: Not very hard  Food Insecurity: No Food Insecurity  . Worried About Programme researcher, broadcasting/film/video in the Last Year: Never true  . Ran Out of Food in the Last Year: Never true  Transportation Needs: No Transportation Needs  . Lack of Transportation (Medical): No  . Lack of Transportation (Non-Medical): No  Physical Activity:   . Days of Exercise per Week: Not on file  . Minutes of Exercise per Session: Not on file  Stress: Stress Concern Present  . Feeling of Stress : To some extent  Social Connections: Slightly Isolated  . Frequency of Communication with Friends and Family: More than three times a week  . Frequency of Social Gatherings with Friends and Family: Once a week  . Attends Religious Services: More than 4 times per year  . Active Member of Clubs or Organizations: Yes  . Attends Banker Meetings: Not on file  . Marital Status: Divorced    Outpatient Encounter Medications as of 07/14/2019  Medication Sig  . acyclovir (ZOVIRAX) 400 MG tablet Take 400 mg by mouth 4 (four) times daily as needed (break outs on nose).  [name] alprazolam (XANAX) 2 MG tablet Take 2 mg by mouth 3 (three) times daily as needed for sleep.  [name] amphetamine-dextroamphetamine (ADDERALL) 10 MG tablet Take 1 tablet (10 mg total) by mouth daily with breakfast.  . azelastine (ASTELIN) 0.1 % nasal spray PLACE 2 SPRAYS INTO BOTH NOSTRILS 2 TIMES DAILY. USE IN EACH NOSTRIL AS DIRECTED  . Cholecalciferol (VITAMIN D3) 125 MCG (5000 UT) CAPS Take 5,000 Units by mouth daily.   [name] FLUoxetine (PROZAC) 10 MG tablet TAKE 2 TABLETS (20 MG TOTAL) BY MOUTH DAILY.  . hydrOXYzine  (ATARAX/VISTARIL) 50 MG tablet TAKE 1 TABLET BY MOUTH AT BEDTIME. NEED OFFICE VISIT FOR FURTHER REFILLS  . meloxicam (MOBIC) 15 MG tablet Take 15 mg by mouth daily.  . Multiple Vitamin (MULTIVITAMIN WITH MINERALS) TABS tablet Take 1 tablet by mouth daily.  [name] omeprazole (PRILOSEC) 20 MG capsule Take 20 mg by mouth 2 (two) times daily before a meal.  . Oxycodone HCl 10 MG TABS Take 1 tablet (10 mg total) by mouth every 6 (six) hours as needed (pain).  . polyethylene glycol (MIRALAX / GLYCOLAX) 17 g packet Take 17 g by mouth daily as needed.  . potassium chloride (KLOR-CON) 10 MEQ tablet TAKE 1 TABLET BY MOUTH DAILY.  . Probiotic Product (PROBIOTIC PO) Take 1 capsule by mouth daily.   . simvastatin (ZOCOR) 80 MG tablet Take 80 mg by mouth daily.  . vitamin E 400 UNIT capsule Take 400 Units by mouth daily.  . Biotin 5000 MCG TABS Take 5,000 mcg by mouth daily.   No facility-administered encounter medications on file as of 07/14/2019.    Activities of Daily Living In your present state of health, do you have any difficulty performing the following activities: 07/14/2019 03/23/2019  Hearing? N N  Vision? N N  Difficulty concentrating or making decisions? N N  Walking or climbing stairs? N N  Dressing or bathing? N N  Doing errands, shopping? N N  Preparing Food and eating ? N -  Using the Toilet? N -  In the past six months, have you accidently leaked urine? Y -  Do you have problems with loss of bowel control? N -  Managing your Medications? N -  Managing your Finances? N -  Housekeeping or managing your Housekeeping? N -  Some recent data might be hidden    Patient Care Team: Laurey Morale, MD as PCP - General Sueanne Margarita, MD as Consulting Physician (Cardiology)    Assessment:   This is a routine wellness examination for Michaelyn.  Exercise Activities and Dietary recommendations Current Exercise Habits: The patient does not participate in regular exercise at present, Exercise  limited by: orthopedic condition(s)  Goals    . Exercise 150 min/wk Moderate Activity     Increase physical activity     . Quit Smoking     Find effective way to deal with negative energy such as exercise and staying busy with a project       Fall Risk Fall Risk  07/14/2019 08/11/2018 07/10/2018 07/09/2017 03/30/2016  Falls in the past year? 0 1 1 Yes Yes  Number falls in past yr: - 1 1 2  or more 2 or more  Injury with Fall? - 1 - No -  Risk for fall due to : Medication side effect - History of fall(s);Impaired vision;Impaired balance/gait;Medication side effect;Impaired mobility;Mental status change - -  Follow up Falls evaluation completed;Education provided;Falls prevention discussed Falls evaluation completed Falls evaluation completed;Education provided;Falls prevention discussed Education provided -  Comment - - - states she has learned how to fall; often gets dizzy -   Is the patient's home free of loose throw rugs in walkways, pet beds, electrical cords, etc?   yes      Grab bars in the bathroom? no      Handrails on the stairs?   yes      Adequate lighting?   yes  Timed Get Up and Go performed: N/A due to telephone visit  Depression Screen PHQ 2/9 Scores 07/14/2019 07/25/2018 07/10/2018 07/09/2017  PHQ - 2 Score 1 1 2 2   PHQ- 9 Score - - 19 7     Cognitive Function MMSE - Mini Mental State Exam 03/30/2016  Orientation to time 5  Orientation to Place 4  Registration 3  Attention/ Calculation 4  Recall 2  Language- name 2 objects 2  Language- repeat 1  Language- follow 3 step command 3  Language- read & follow direction 1  Write a sentence 1  Copy design 1  Total score 27     6CIT Screen 07/14/2019  What Year? 0 points  What month? 0 points  What time? 0 points  Count back from 20 0 points  Months in reverse 0 points  Repeat phrase 0 points  Total Score 0    Immunization History  Administered Date(s) Administered  . Influenza Split 04/26/2011  . Influenza  Whole 03/23/2010  . Influenza,inj,Quad PF,6+ Mos 03/20/2013, 03/02/2015, 03/26/2016, 03/25/2017, 05/08/2019  . Influenza-Unspecified 05/26/2014, 03/03/2018  . Pneumococcal Conjugate-13 04/19/2016  . Pneumococcal Polysaccharide-23 03/07/2018  . Tdap 04/20/2013  . Zoster 03/26/2016  . Zoster Recombinat (Shingrix) 02/18/2019    Qualifies for Shingles Vaccine?yes and patient has received #1 of 2 vaccines.  Screening Tests Health Maintenance  Topic Date Due  . PAP SMEAR-Modifier  07/10/2019  . MAMMOGRAM  03/26/2020  . TETANUS/TDAP  04/21/2023  . COLONOSCOPY  04/25/2026  . INFLUENZA VACCINE  Completed  . Hepatitis C Screening  Completed  . HIV Screening  Completed    Cancer Screenings: Lung: Low Dose CT Chest recommended if Age 51-80 years, 30 pack-year currently smoking OR have quit w/in 15years. Patient does qualify but declines at this time. Breast:  Up to date on  Mammogram? Yes   Up to date of Bone Density/Dexa? Yes Colorectal: yes  Additional Screenings:  Hepatitis C Screening: completed 02/202017    Plan:   Extensive time was spent encouraging smoking cessation. Encouraged her to throw away current pack of cigarettes, do not go outside with sister to talk when she is smoking, make a to-do-list of projects to focus on, exercise on glider to focus negative energy on when she is upset. She has been using a Bible scripture app on her phone and has found great comfort in that. She states that she is sleeping 8-10 hours nightly by using the Bible scripture app.  I have personally reviewed and noted the following in the patient's chart:   . Medical and social history . Use of alcohol, tobacco or illicit drugs  . Current medications and supplements . Functional ability and status . Nutritional status . Physical activity . Advanced directives . List of other physicians . Hospitalizations, surgeries, and ER visits in previous 12 months . Vitals . Screenings to include cognitive,  depression, and falls . Referrals and appointments  In addition, I have reviewed and discussed with patient certain preventive protocols, quality metrics, and best practice recommendations. A written personalized care plan for preventive services as well as general preventive health recommendations were provided to patient.     Nathaniel ManChristy Wilbur [place], LPN  4/09/81191/26/2021

## 2019-07-14 NOTE — Patient Instructions (Addendum)
[name] [name] , Thank you for taking time to participate in your Medicare Wellness Visit. I appreciate your ongoing commitment to your health goals. Please review the following plan we discussed and let me know if I can assist you in the future.   Screening recommendations/referrals: Colorectal Screening: colonoscopy completed 04/25/2016; due again 04/25/2021. Mammogram: completed 04/06/2019; due again 04/05/2020. Bone Density: completed 02/19/2019; due 02/18/2021.  Vision and Dental Exams: Recommended annual ophthalmology exams for early detection of glaucoma and other disorders of the eye Recommended annual dental exams for proper oral hygiene  Diabetic Exams: Diabetic Eye Exam: N/A Diabetic Foot Exam: N/A  Vaccinations: Influenza vaccine: completed 05/08/2019; due again in Fall 2021. Pneumococcal vaccine: completed 04/19/2016 & 03/07/2018. Tdap vaccine: completed 04/20/2013; due again 04/21/2023. Shingles vaccine: completed shingrix #1 on 02/18/2019. Shingrix #2 is due.  Advanced directives: Advance directives discussed with you today. You have declined to receive documents for completion.  Goals: Recommend to drink at least 6-8 8oz glasses of water per day.  Recommend to exercise for at least 150 minutes per week.  Recommend to continue efforts to reduce smoking habits until no longer smoking. Smoking Cessation literature is attached below.  Next appointment: Please schedule your Annual Wellness Visit with your Nurse Health Advisor in one year.  Preventive Care 40-64 Years, Female Preventive care refers to lifestyle choices and visits with your health care provider that can promote health and wellness. What does preventive care include?  A yearly physical exam. This is also called an annual well check.  Dental exams once or twice a year.  Routine eye exams. Ask your health care provider how often you should have your eyes checked.  Personal lifestyle choices, including:   Daily care of your teeth and gums.  Regular physical activity.  Eating a healthy diet.  Avoiding tobacco and drug use.  Limiting alcohol use.  Practicing safe sex.  Taking low-dose aspirin daily starting at age 36 if recommended by your health care provider.  Taking vitamin and mineral supplements as recommended by your health care provider. What happens during an annual well check? The services and screenings done by your health care provider during your annual well check will depend on your age, overall health, lifestyle risk factors, and family history of disease. Counseling  Your health care provider may ask you questions about your:  Alcohol use.  Tobacco use.  Drug use.  Emotional well-being.  Home and relationship well-being.  Sexual activity.  Eating habits.  Work and work Statistician.  Method of birth control.  Menstrual cycle.  Pregnancy history. Screening  You may have the following tests or measurements:  Height, weight, and BMI.  Blood pressure.  Lipid and cholesterol levels. These may be checked every 5 years, or more frequently if you are over 61 years old.  Skin check.  Lung cancer screening. You may have this screening every year starting at age 73 if you have a 30-pack-year history of smoking and currently smoke or have quit within the past 15 years.  Fecal occult blood test (FOBT) of the stool. You may have this test every year starting at age 61.  Flexible sigmoidoscopy or colonoscopy. You may have a sigmoidoscopy every 5 years or a colonoscopy every 10 years starting at age 70.  Hepatitis C blood test.  Hepatitis B blood test.  Sexually transmitted disease (STD) testing.  Diabetes screening. This is done by checking your blood sugar (glucose) after you have not eaten for a while (fasting). You may  have this done every 1-3 years.  Mammogram. This may be done every 1-2 years. Talk to your health care provider about when you should  start having regular mammograms. This may depend on whether you have a family history of breast cancer.  BRCA-related cancer screening. This may be done if you have a family history of breast, ovarian, tubal, or peritoneal cancers.  Pelvic exam and Pap test. This may be done every 3 years starting at age 90. Starting at age 71, this may be done every 5 years if you have a Pap test in combination with an HPV test.  Bone density scan. This is done to screen for osteoporosis. You may have this scan if you are at high risk for osteoporosis. Discuss your test results, treatment options, and if necessary, the need for more tests with your health care provider. Vaccines  Your health care provider may recommend certain vaccines, such as:  Influenza vaccine. This is recommended every year.  Tetanus, diphtheria, and acellular pertussis (Tdap, Td) vaccine. You may need a Td booster every 10 years.  Zoster vaccine. You may need this after age 1.  Pneumococcal 13-valent conjugate (PCV13) vaccine. You may need this if you have certain conditions and were not previously vaccinated.  Pneumococcal polysaccharide (PPSV23) vaccine. You may need one or two doses if you smoke cigarettes or if you have certain conditions. Talk to your health care provider about which screenings and vaccines you need and how often you need them. This information is not intended to replace advice given to you by your health care provider. Make sure you discuss any questions you have with your health care provider. Document Released: 07/01/2015 Document Revised: 02/22/2016 Document Reviewed: 04/05/2015 Elsevier Interactive Patient Education  2017 [place] Prevention in the Home Falls can cause injuries. They can happen to people of all ages. There are many things you can do to make your home safe and to help prevent falls. What can I do on the outside of my home?  Regularly fix the edges of walkways and  driveways and fix any cracks.  Remove anything that might make you trip as you walk through a door, such as a raised step or threshold.  Trim any bushes or trees on the path to your home.  Use bright outdoor lighting.  Clear any walking paths of anything that might make someone trip, such as rocks or tools.  Regularly check to see if handrails are loose or broken. Make sure that both sides of any steps have handrails.  Any raised decks and porches should have guardrails on the edges.  Have any leaves, snow, or ice cleared regularly.  Use sand or salt on walking paths during winter.  Clean up any spills in your garage right away. This includes oil or grease spills. What can I do in the bathroom?  Use night lights.  Install grab bars by the toilet and in the tub and shower. Do not use towel bars as grab bars.  Use non-skid mats or decals in the tub or shower.  If you need to sit down in the shower, use a plastic, non-slip stool.  Keep the floor dry. Clean up any water that spills on the floor as soon as it happens.  Remove soap buildup in the tub or shower regularly.  Attach bath mats securely with double-sided non-slip rug tape.  Do not have throw rugs and other things on the floor that can make you trip.  What can I do in the bedroom?  Use night lights.  Make sure that you have a light by your bed that is easy to reach.  Do not use any sheets or blankets that are too big for your bed. They should not hang down onto the floor.  Have a firm chair that has side arms. You can use this for support while you get dressed.  Do not have throw rugs and other things on the floor that can make you trip. What can I do in the kitchen?  Clean up any spills right away.  Avoid walking on wet floors.  Keep items that you use a lot in easy-to-reach places.  If you need to reach something above you, use a strong step stool that has a grab bar.  Keep electrical cords out of the  way.  Do not use floor polish or wax that makes floors slippery. If you must use wax, use non-skid floor wax.  Do not have throw rugs and other things on the floor that can make you trip. What can I do with my stairs?  Do not leave any items on the stairs.  Make sure that there are handrails on both sides of the stairs and use them. Fix handrails that are broken or loose. Make sure that handrails are as long as the stairways.  Check any carpeting to make sure that it is firmly attached to the stairs. Fix any carpet that is loose or worn.  Avoid having throw rugs at the top or bottom of the stairs. If you do have throw rugs, attach them to the floor with carpet tape.  Make sure that you have a light switch at the top of the stairs and the bottom of the stairs. If you do not have them, ask someone to add them for you. What else can I do to help prevent falls?  Wear shoes that:  Do not have high heels.  Have rubber bottoms.  Are comfortable and fit you well.  Are closed at the toe. Do not wear sandals.  If you use a stepladder:  Make sure that it is fully opened. Do not climb a closed stepladder.  Make sure that both sides of the stepladder are locked into place.  Ask someone to hold it for you, if possible.  Clearly mark and make sure that you can see:  Any grab bars or handrails.  First and last steps.  Where the edge of each step is.  Use tools that help you move around (mobility aids) if they are needed. These include:  Canes.  Walkers.  Scooters.  Crutches.  Turn on the lights when you go into a dark area. Replace any light bulbs as soon as they burn out.  Set up your furniture so you have a clear path. Avoid moving your furniture around.  If any of your floors are uneven, fix them.  If there are any pets around you, be aware of where they are.  Review your medicines with your doctor. Some medicines can make you feel dizzy. This can increase your chance  of falling. Ask your doctor what other things that you can do to help prevent falls. This information is not intended to replace advice given to you by your health care provider. Make sure you discuss any questions you have with your health care provider. Document Released: 03/31/2009 Document Revised: 11/10/2015 Document Reviewed: 07/09/2014 Elsevier Interactive Patient Education  2017 [employer].

## 2019-07-24 ENCOUNTER — Other Ambulatory Visit: Payer: Self-pay | Admitting: Family Medicine

## 2019-07-24 NOTE — Telephone Encounter (Signed)
Last filled 02/06/2019 Last OV 07/01/2019  Ok to fill?

## 2019-07-27 ENCOUNTER — Telehealth: Payer: Self-pay | Admitting: *Deleted

## 2019-07-27 NOTE — Telephone Encounter (Signed)
Staff message sent to [name] [name] received for CPAP titration. Auth # [id number]. Valid dates 07/27/19 to 10/25/19.

## 2019-07-27 NOTE — Telephone Encounter (Signed)
Patient has a titration appointment scheduled for 08/25/19. Patient was grateful for the call and thanked me.

## 2019-07-27 NOTE — Addendum Note (Signed)
Addended by: Reesa Chew on: 07/27/2019 02:00 PM   Modules accepted: Orders

## 2019-07-27 NOTE — Telephone Encounter (Signed)
Patient has a 3 month follow up appointment scheduled for 08/25/19. Patient was grateful for the call and thanked me.

## 2019-07-29 ENCOUNTER — Other Ambulatory Visit: Payer: Self-pay | Admitting: Family Medicine

## 2019-07-29 NOTE — Telephone Encounter (Signed)
Last filled 07/01/2019 Last OV 07/01/2019  Ok to fill?

## 2019-08-03 ENCOUNTER — Other Ambulatory Visit: Payer: Self-pay | Admitting: Family Medicine

## 2019-08-03 ENCOUNTER — Other Ambulatory Visit: Payer: Self-pay

## 2019-08-03 ENCOUNTER — Telehealth (INDEPENDENT_AMBULATORY_CARE_PROVIDER_SITE_OTHER): Payer: PPO | Admitting: Family Medicine

## 2019-08-03 ENCOUNTER — Telehealth: Payer: Self-pay | Admitting: Family Medicine

## 2019-08-03 DIAGNOSIS — F1190 Opioid use, unspecified, uncomplicated: Secondary | ICD-10-CM | POA: Diagnosis not present

## 2019-08-03 DIAGNOSIS — M5440 Lumbago with sciatica, unspecified side: Secondary | ICD-10-CM

## 2019-08-03 DIAGNOSIS — G8929 Other chronic pain: Secondary | ICD-10-CM

## 2019-08-03 MED ORDER — AMPHETAMINE-DEXTROAMPHETAMINE 10 MG PO TABS: 10.0000 mg | ORAL_TABLET | Freq: Every day | ORAL | 0 refills | Status: DC

## 2019-08-03 MED ORDER — HYDROCODONE-ACETAMINOPHEN 10-325 MG PO TABS: 1.0000 | ORAL_TABLET | Freq: Four times a day (QID) | ORAL | 0 refills | Status: DC | PRN

## 2019-08-03 NOTE — Telephone Encounter (Signed)
Medication Refill: Adderall, Oxycodone  Pharmacy: [facility]  [phone]

## 2019-08-03 NOTE — Telephone Encounter (Signed)
She will need a PMV for the Oxycodone, and we can do the Adderall at the same time

## 2019-08-03 NOTE — Telephone Encounter (Signed)
Last filled 07/01/2019 Last OV 07/01/2019  Ok to fill? 

## 2019-08-03 NOTE — Progress Notes (Signed)
Virtual Visit via Video Note  I connected with the patient on 08/03/19 at  4:00 PM EST by a video enabled telemedicine application and verified that I am speaking with the correct person using two identifiers.  Location patient: home Location provider:work or home office Persons participating in the virtual visit: patient, provider  I discussed the limitations of evaluation and management by telemedicine and the availability of in person appointments. The patient expressed understanding and agreed to proceed.   HPI: Here for pain management. Last month she wanted to try Oxycodone for her pain, but she had been disappointed with this. She wants to go back on Norco like before.  Indication for chronic opioid: low back pain Medication and dose: Norco 10-325 # pills per month: 120 Last UDS date: 05-08-19 Opioid Treatment Agreement signed (Y/N): 09-17-17 Opioid Treatment Agreement last reviewed with patient:  08-03-19 NCCSRS reviewed this encounter (include red flags): Yes    ROS: See pertinent positives and negatives per HPI.  Past Medical History:  Diagnosis Date  . Allergy    seasonal  . Anxiety   . Arthritis   . Bell's palsy 2008   pt states she had a 'mini stroke' and nervous breakdown which caused facial drooping  . Bipolar 1 disorder (HCC)   . CPAP (continuous positive airway pressure) dependence   . Depression    sees the [facility]   . GERD (gastroesophageal reflux disease)   . Hyperlipidemia   . Hypertension    meds no longer needed  . Memory loss    short term  . Meniere disease   . Obesity (BMI 30-39.9) 11/10/2015  . Post concussion syndrome 07/01/2018  . PPD positive 2007 or 2008  . Sleep apnea    on CPAP therapy  . Urinary incontinence   . Vestibular disorder   . Wears dentures   . Wears glasses     Past Surgical History:  Procedure Laterality Date  . ABDOMINAL HYSTERECTOMY    . ANTERIOR FUSION CERVICAL SPINE  2006   Dr. Wynetta Emery  . BREAST BIOPSY Right  1981   benign  . BREAST EXCISIONAL BIOPSY Right 1981   benign   scar does not show  . BREAST SURGERY Bilateral 12-11-12   reductions per Dr. Ivin Booty in [facility]   . CATARACT EXTRACTION, BILATERAL Bilateral last summer of 2018   at [facility]  . COLONOSCOPY  04/25/2016   per Dr. Loreta Ave, adenomatous polyps, repeat in 5 yrs   . DRUG INDUCED ENDOSCOPY N/A 01/30/2019   Procedure: DRUG INDUCED ENDOSCOPY;  Surgeon: Christia Reading, MD;  Location: [facility];  Service: ENT;  Laterality: N/A;  . ESOPHAGOGASTRODUODENOSCOPY  04-23-11   per Dr. Loreta Ave, normal   . EYE SURGERY  06/18/2016   L eyelid   . IMPLANTATION OF HYPOGLOSSAL NERVE STIMULATOR Right 03/25/2019   Procedure: IMPLANTATION OF HYPOGLOSSAL NERVE STIMULATOR;  Surgeon: Christia Reading, MD;  Location: [facility] OR;  Service: ENT;  Laterality: Right;  . LUMBAR DISC SURGERY  2003   Dr. Wynetta Emery  . MULTIPLE TOOTH EXTRACTIONS    . ORIF WRIST FRACTURE Right 08/29/2018  . REDUCTION MAMMAPLASTY Bilateral 2013  . SPLIT NIGHT STUDY  11/11/2015  . UVULOPALATOPHARYNGOPLASTY    . VAGINAL HYSTERECTOMY  12/96   secondary to fibroids.    Family History  Problem Relation Age of Onset  . Dementia Mother   . Hyperlipidemia Mother   . Asthma Father   . Hypertension Sister   . Hyperlipidemia Sister   .  Coronary artery disease Sister   . Thyroid disease Sister   . Hypertension Brother   . Hyperlipidemia Brother   . Coronary artery disease Brother   . Hypertension Sister   . Hyperlipidemia Sister   . Alcohol abuse Other        fhx  . Arthritis Other        fhx  . Coronary artery disease Other        fhx  . Depression Other        fhx  . Diabetes Other        fhx  . Hyperlipidemia Other        fhx  . Hypertension Other        fhx  . Sudden death Other        fhx     Current Outpatient Medications:  .  acyclovir (ZOVIRAX) 400 MG tablet, Take 400 mg by mouth 4 (four) times daily as needed (break outs on nose)., Disp: , Rfl:  .   alprazolam (XANAX) 2 MG tablet, TAKE 1 TABLET BY MOUTH 3 TIMES A DAY AS NEEDED, Disp: 90 tablet, Rfl: 5 .  amphetamine-dextroamphetamine (ADDERALL) 10 MG tablet, Take 1 tablet (10 mg total) by mouth daily with breakfast., Disp: 30 tablet, Rfl: 0 .  azelastine (ASTELIN) 0.1 % nasal spray, PLACE 2 SPRAYS INTO BOTH NOSTRILS 2 TIMES DAILY. USE IN EACH NOSTRIL AS DIRECTED, Disp: 30 mL, Rfl: 11 .  Biotin 5000 MCG TABS, Take 5,000 mcg by mouth daily., Disp: , Rfl:  .  Cholecalciferol (VITAMIN D3) 125 MCG (5000 UT) CAPS, Take 5,000 Units by mouth daily. , Disp: , Rfl:  .  FLUoxetine (PROZAC) 10 MG tablet, TAKE 2 TABLETS (20 MG TOTAL) BY MOUTH DAILY., Disp: 60 tablet, Rfl: 11 .  hydrOXYzine (ATARAX/VISTARIL) 50 MG tablet, TAKE 1 TABLET BY MOUTH AT BEDTIME. NEED OFFICE VISIT FOR FURTHER REFILLS, Disp: 30 tablet, Rfl: 11 .  meloxicam (MOBIC) 15 MG tablet, Take 15 mg by mouth daily., Disp: , Rfl:  .  Multiple Vitamin (MULTIVITAMIN WITH MINERALS) TABS tablet, Take 1 tablet by mouth daily., Disp: , Rfl:  .  omeprazole (PRILOSEC) 20 MG capsule, Take 20 mg by mouth 2 (two) times daily before a meal., Disp: , Rfl:  .  polyethylene glycol (MIRALAX / GLYCOLAX) 17 g packet, Take 17 g by mouth daily as needed., Disp: , Rfl:  .  potassium chloride (KLOR-CON) 10 MEQ tablet, TAKE 1 TABLET BY MOUTH DAILY., Disp: 30 tablet, Rfl: 2 .  Probiotic Product (PROBIOTIC PO), Take 1 capsule by mouth daily. , Disp: , Rfl:  .  simvastatin (ZOCOR) 80 MG tablet, Take 80 mg by mouth daily., Disp: , Rfl:  .  vitamin E 400 UNIT capsule, Take 400 Units by mouth daily., Disp: , Rfl:   EXAM:  VITALS per patient if applicable:  GENERAL: alert, oriented, appears well and in no acute distress  HEENT: atraumatic, conjunttiva clear, no obvious abnormalities on inspection of external nose and ears  NECK: normal movements of the head and neck  LUNGS: on inspection no signs of respiratory distress, breathing rate appears normal, no obvious  gross SOB, gasping or wheezing  CV: no obvious cyanosis  MS: moves all visible extremities without noticeable abnormality  PSYCH/NEURO: pleasant and cooperative, no obvious depression or anxiety, speech and thought processing grossly intact  ASSESSMENT AND PLAN: Pain management, we wrote for a 3 month supply of Norco 10-325.  Alysia Penna, MD  Discussed the following assessment  and plan:  No diagnosis found.     I discussed the assessment and treatment plan with the patient. The patient was provided an opportunity to ask questions and all were answered. The patient agreed with the plan and demonstrated an understanding of the instructions.   The patient was advised to call back or seek an in-person evaluation if the symptoms worsen or if the condition fails to improve as anticipated.

## 2019-08-03 NOTE — Telephone Encounter (Signed)
Last filled 07/01/2019, oxycodone is not on the Rx list Last OV 07/01/2019 Please advise.

## 2019-08-05 NOTE — Telephone Encounter (Signed)
Patient is scheduled for CPAP Titration on 08/25/19. PT is scheduled for COVID screening on 08/22/19 11:15 prior to titration.  Patient understands her titration study will be done at [facility] sleep lab. Patient understands her will receive a letter in a week or so detailing appointment, date, time, and location. Patient understands to call if she does not receive the letter  in a timely manner. Patient agrees with treatment and thanked me for call.

## 2019-08-21 ENCOUNTER — Other Ambulatory Visit: Payer: Self-pay | Admitting: Family Medicine

## 2019-08-22 ENCOUNTER — Other Ambulatory Visit (HOSPITAL_COMMUNITY)
Admission: RE | Admit: 2019-08-22 | Discharge: 2019-08-22 | Disposition: A | Discharge status: Home / Self Care | Payer: PPO | Source: Ambulatory Visit | Attending: Cardiology | Admitting: Cardiology

## 2019-08-22 DIAGNOSIS — Z20822 Contact with and (suspected) exposure to covid-19: Secondary | ICD-10-CM | POA: Diagnosis not present

## 2019-08-22 DIAGNOSIS — Z01812 Encounter for preprocedural laboratory examination: Secondary | ICD-10-CM | POA: Diagnosis not present

## 2019-08-22 LAB — SARS CORONAVIRUS 2 (TAT 6-24 HRS): SARS Coronavirus 2: NEGATIVE

## 2019-08-25 ENCOUNTER — Ambulatory Visit (HOSPITAL_BASED_OUTPATIENT_CLINIC_OR_DEPARTMENT_OTHER): Payer: PPO | Attending: Cardiology | Admitting: Cardiology

## 2019-08-25 ENCOUNTER — Other Ambulatory Visit: Payer: Self-pay

## 2019-08-25 DIAGNOSIS — Z79899 Other long term (current) drug therapy: Secondary | ICD-10-CM | POA: Insufficient documentation

## 2019-08-25 DIAGNOSIS — G4733 Obstructive sleep apnea (adult) (pediatric): Secondary | ICD-10-CM | POA: Insufficient documentation

## 2019-08-26 ENCOUNTER — Telehealth: Payer: Self-pay | Admitting: Family Medicine

## 2019-08-26 NOTE — Progress Notes (Signed)
  Chronic Care Management   Outreach Note  08/26/2019 Name: [name] [name] MRN: [id number] DOB: 12/21/1955  Referred by: Nelwyn Salisbury, MD Reason for referral : No chief complaint on file.   An unsuccessful telephone outreach was attempted today. The patient was referred to the pharmacist for assistance with care management and care coordination.   Follow Up Plan:   Raynicia Dukes [employer] Scheduler

## 2019-08-27 ENCOUNTER — Telehealth: Payer: Self-pay | Admitting: Family Medicine

## 2019-08-27 NOTE — Procedures (Signed)
    Patient Name: [name] [name], [name] [name] Date: 08/25/2019 Gender: Female D.O.B: Jun 23, 1955 Age (years): 63 Referring Provider: Armanda Magic MD, ABSM Height (inches): 63 Interpreting Physician: Armanda Magic MD, ABSM Weight (lbs): 167 RPSGT: Shelah Lewandowsky BMI: 30 MRN: [id number] Neck Size: 15.00  CLINICAL INFORMATION The patient is referred for a Hypoglossal nerve stimulator titration to treat sleep apnea.  SLEEP STUDY TECHNIQUE As per the AASM Manual for the Scoring of Sleep and Associated Events v2.3 (April 2016) with a hypopnea requiring 4% desaturations.  The channels recorded and monitored were frontal, central and occipital EEG, electrooculogram (EOG), submentalis EMG (chin), nasal and oral airflow, thoracic and abdominal wall motion, anterior tibialis EMG, snore microphone, electrocardiogram, and pulse oximetry. Continuous positive airway pressure (CPAP) was initiated at the beginning of the study and titrated to treat sleep-disordered breathing.  MEDICATIONS Medications self-administered by patient taken the night of the study : OMEPRAZOLE, SIMVASTATIN, MELATONIN, HYDROXYZINE, VITAMIN E  TECHNICIAN COMMENTS Comments added by technician: NONE Comments added by scorer: N/A  RESPIRATORY PARAMETERS Optimal PAP Pressure (cm): 2.9  AHI at Optimal Pressure (/hr):0 Overall Minimal O2 (%):84.0  Supine % at Optimal Pressure (%): N/A Minimal O2 at Optimal Pressure (%): 89.0   SLEEP ARCHITECTURE The study was initiated at 10:15:55 PM and ended at 4:56:23 AM.  Sleep onset time was 60.4 minutes and the sleep efficiency was 64.1%. The total sleep time was 256.5 minutes.  The patient spent 16.4% of the night in stage N1 sleep, 68.4% in stage N2 sleep, 0.0% in stage N3 and 15.2% in REM.Stage REM latency was 248.5 minutes  Wake after sleep onset was 83.5. Alpha intrusion was absent. Supine sleep was 50.68%.  CARDIAC DATA The 2 lead EKG demonstrated sinus rhythm. The mean heart  rate was 61.3 beats per minute. Other EKG findings include: None.  LEG MOVEMENT DATA The total Periodic Limb Movements of Sleep (PLMS) were 0. The PLMS index was 0.0. A PLMS index of <15 is considered normal in adults.  IMPRESSIONS - An optimal PAP pressure could not be selected for this patient based on the available study data. - Mild Central Sleep Apnea was noted during this titration (CAI = 6.5/h). - Moderate oxygen desaturations were observed during this titration (min O2 = 84.0%). - The patient snored with soft snoring volume during this titration study. - No cardiac abnormalities were observed during this study. - Clinically significant periodic limb movements were not noted during this study. Arousals associated with PLMs were rare.  DIAGNOSIS - Obstructive Sleep Apnea (327.23 [G47.33 ICD-10])  RECOMMENDATIONS - Recommended amplitude for Inspire device 2.1V. - Avoid alcohol, sedatives and other CNS depressants that may worsen sleep apnea and disrupt normal sleep architecture. - Sleep hygiene should be reviewed to assess factors that may improve sleep quality. - Weight management and regular exercise should be initiated or continued. - Return to Sleep Center for re-evaluation after 4 weeks of therapy  [Electronically signed] 08/27/2019 09:01 PM  Armanda Magic MD, ABSM Diplomate, American Board of Sleep Medicine

## 2019-08-27 NOTE — Telephone Encounter (Signed)
[place] Forms to be filled out- placed in dr's folder.  Call [phone] upon completion.

## 2019-08-31 ENCOUNTER — Telehealth: Payer: Self-pay | Admitting: Family Medicine

## 2019-08-31 ENCOUNTER — Telehealth: Payer: Self-pay | Admitting: *Deleted

## 2019-08-31 NOTE — Telephone Encounter (Signed)
The form is ready, in Lindsay's folder

## 2019-08-31 NOTE — Progress Notes (Signed)
  Chronic Care Management   Outreach Note  08/31/2019 Name: [name] [name] MRN: [id number] DOB: 1956-03-14  Referred by: Nelwyn Salisbury, MD Reason for referral : No chief complaint on file.   A second unsuccessful telephone outreach was attempted today. The patient was referred to pharmacist for assistance with care management and care coordination.  Follow Up Plan:   Raynicia Dukes [employer] Scheduler

## 2019-08-31 NOTE — Telephone Encounter (Signed)
-----   Message from [name], MD sent at 08/27/2019  9:06 PM EST ----- Successful Inspire titration - please call Inspire rep to determine when they want followup with patient in our office

## 2019-08-31 NOTE — Telephone Encounter (Signed)
Informed patient of titration results and verbalized understanding was indicated. Patient understands her titration study showed a Successful Inspire titration. Patient understands she has an appointment scheduled for 10/06/19 for followup with in our office.

## 2019-09-02 ENCOUNTER — Other Ambulatory Visit: Payer: Self-pay | Admitting: Family Medicine

## 2019-09-02 NOTE — Telephone Encounter (Signed)
Forms placed at front desk. Forms picked up.

## 2019-09-02 NOTE — Telephone Encounter (Signed)
Mr. [name] [name] came in and picked up the forms for the pt and copies were given to [facility] with the charge sheet and she is going to send to scan.

## 2019-09-04 ENCOUNTER — Telehealth: Payer: Self-pay | Admitting: Family Medicine

## 2019-09-04 DIAGNOSIS — E7849 Other hyperlipidemia: Secondary | ICD-10-CM

## 2019-09-04 DIAGNOSIS — I10 Essential (primary) hypertension: Secondary | ICD-10-CM

## 2019-09-04 NOTE — Progress Notes (Signed)
°  Chronic Care Management   Note  09/04/2019 Name: Becka Lagasse MRN: [id number] DOB: 1956/05/09  Oda Placke is a 64 y.o. year old female who is a primary care patient of Nelwyn Salisbury, MD. I reached out to [name] by phone today in response to a referral sent by Ms. Guadalupe Maple Curiale's PCP, Nelwyn Salisbury, MD.   Ms. Cichowski was given information about Chronic Care Management services today including:  1. CCM service includes personalized support from designated clinical staff supervised by her physician, including individualized plan of care and coordination with other care providers 2. 24/7 contact phone numbers for assistance for urgent and routine care needs. 3. Service will only be billed when office clinical staff spend 20 minutes or more in a month to coordinate care. 4. Only one practitioner may furnish and bill the service in a calendar month. 5. The patient may stop CCM services at any time (effective at the end of the month) by phone call to the office staff.   Patient agreed to services and verbal consent obtained.   Follow up plan:   Raynicia Dukes [employer] Scheduler

## 2019-09-04 NOTE — Progress Notes (Signed)
Error  Note already documented     [name] [name] [employer] Scheduler 

## 2019-09-21 NOTE — Chronic Care Management (AMB) (Signed)
Chronic Care Management Pharmacy  Name: [name] [name]  MRN: [id number] DOB: 09/06/1955  Initial Questions: 1. Have you seen any other providers since your last visit? NA 2. Any changes in your medicines or health? No   Chief Complaint/ HPI [name] [name],  64 y.o. , female presents for their Initial CCM visit with the clinical pharmacist via telephone due to COVID-19 Pandemic.  PCP : [name] Salisbury, MD  Their chronic conditions include: HTN, HLD, GERD, ADHD, allergic rhinitis, low back pain   Office Visits: 08/03/2019- Patient presented for virtual visit with Dr. Gershon Crane, MD for pain management. Patient Patient tried oxycodone previously for low back pain and requested to switch back to Norco. Norco prescribed for 3 month supply.   07/14/2019- Patient presented for telephonic visit with [name] Dodge, LPN for medicare annual wellness exam. Extensive time was spent on smoking cessation.   07/01/2019- Patient presented for office visit with Dr. Gershon Crane, MD for pain management and ADHD. Patient tried Dilaudid but caused intense itching and patient went back on Norco. Patient was also on Adderall XR in the past and stated it interfered with her sleep. Plan: patient prescribed Oxycodone (Norco stopped). Patient instructed to add Tylenol as needed. For ADHD, patient prescribed Adderall 10mg  every morning. Patient to follow up in 1 month.   Consult Visit: 06/03/2019- Patient presented for office visit with Dr. 06/05/2019, MD for obstructive sleep apnea. Patient reported intolerant to CPAP. She underwent hypoglossal nerve stimulator with Dr. Armanda [name] on 03/25/2019. Inspire device settings modified per patient request. HTN is well controlled. BP: 130/20mmHg. Patient not on any antihypertensive medications. For obesity, patient encouraged a routine exercise program and cut back on carbs and portions.   Medications: Outpatient Encounter Medications as of 09/22/2019  Medication Sig  .  acyclovir (ZOVIRAX) 400 MG tablet TAKE 1 TABLET BY MOUTH 4 TIMES A DAY AS NEEDED (Patient taking differently: Take 400 mg by mouth 2 (two) times daily. )  . alprazolam (XANAX) 2 MG tablet TAKE 1 TABLET BY MOUTH 3 TIMES A DAY AS NEEDED (Patient taking differently: Take 1 mg by mouth as needed. )  . [START ON 10/01/2019] amphetamine-dextroamphetamine (ADDERALL) 10 MG tablet Take 1 tablet (10 mg total) by mouth daily with breakfast.  . azelastine (ASTELIN) 0.1 % nasal spray PLACE 2 SPRAYS INTO BOTH NOSTRILS 2 TIMES DAILY. USE IN EACH NOSTRIL AS DIRECTED  . Cholecalciferol (VITAMIN D3) 125 MCG (5000 UT) CAPS Take 5,000 Units by mouth daily.   . Ferrous Sulfate (IRON) 325 (65 Fe) MG TABS Take 1 tablet by mouth daily.  10/03/2019 FLUoxetine (PROZAC) 10 MG tablet TAKE 2 TABLETS (20 MG TOTAL) BY MOUTH DAILY.  [name] [name] ON 10/01/2019] HYDROcodone-acetaminophen (NORCO) 10-325 MG tablet Take 1 tablet by mouth every 6 (six) hours as needed for moderate pain.  . hydrOXYzine (ATARAX/VISTARIL) 50 MG tablet TAKE 1 TABLET BY MOUTH AT BEDTIME. NEED OFFICE VISIT FOR FURTHER REFILLS  . Magnesium 500 MG CAPS Take 1 capsule by mouth daily.  . melatonin 3 MG TABS tablet Take 3 mg by mouth at bedtime.  . Multiple Vitamin (MULTIVITAMIN WITH MINERALS) TABS tablet Take 1 tablet by mouth daily.  10/03/2019 omeprazole (PRILOSEC) 20 MG capsule Take 20 mg by mouth 2 (two) times daily before a meal.  . polyethylene glycol (MIRALAX / GLYCOLAX) 17 g packet Take 17 g by mouth daily as needed.  . potassium chloride (KLOR-CON) 10 MEQ tablet TAKE 1 TABLET BY MOUTH DAILY.  . Probiotic  Product (PROBIOTIC PO) Take 1 capsule by mouth daily.   . simvastatin (ZOCOR) 80 MG tablet Take 80 mg by mouth daily.  [name] [name] VITAMIN E PO Take by mouth daily. Takes 184mg   . Biotin 5000 MCG TABS Take 5,000 mcg by mouth daily.  . meloxicam (MOBIC) 15 MG tablet Take 15 mg by mouth daily.   No facility-administered encounter medications on file as of 09/22/2019.     Current  Diagnosis/Assessment:  Goals Addressed            This Visit's Progress   . Pharmacy Care Plan       CARE PLAN ENTRY Current Barriers:  . Chronic Disease Management support, education, and care coordination needs related to HTN, HLD, and GERD, ADHD, Allergic rhinitis, low back pain   Pharmacist Clinical Goal(s):  11/22/2019 Over next 6 months, patient will work on the following goals:  . Blood pressure:  [name] [name] Maintain Blood pressure <130/80 mmHg  . Maintain low salt diet.  . High cholesterol:  . Cholesterol goals: Total Cholesterol goal under 200, Triglycerides goal under 150, HDL goal above 40 (men) or above 50 (women), LDL goal under 100.  [name] [name] GERD/ PUD . Minimize reflux symptoms.   Interventions: . Comprehensive medication review performed. . Blood pressure:  . Discussed need to continue checking blood pressure at home.  . Discussed diet modifications. DASH diet:  following a diet emphasizing fruits and vegetables and low-fat dairy products along with whole grains, fish, poultry, and nuts. Reducing red meats and sugars.  . Exercising   . Reducing the amount of salt intake to 1500mg /per day.  . Recommend using a salt substitute to replace your salt if you need flavor.    . Getting enough potassium in your diet equaling 3500-5000mg /day.  This helps to regulate BP by balancing out the effects of salt.   . Weight reduction- We discussed losing 5-10% of body weight . Continue: control with diet and exercise.  . High cholesterol . How to reduce cholesterol through diet/weight management and physical activity.    . We discussed how a diet high in plant sterols (fruits/vegetables/nuts/whole grains/legumes) may reduce your cholesterol.  Encouraged increasing fiber to a daily intake of 10-25g/day  . Continue:  simvastatin 80mg , 1 tablet once daily  . GERD: . Discussed non-pharmacological interventions for acid reflux. Take measures to prevent acid reflux, such as avoiding spicy foods, avoiding  caffeine, avoid laying down a few hours after eating, and raising the head of the bed. . Continue: omeprazole 20mg , 1 tablet twice daily before a meal  . ADHD . Continue: amphetamine- dextroamphetamine (Adderall) 10mg , 1 tablet once daily with breakfast   . Anxiety/ depression . Continue:  - fluoxetine 10mg , 2 tablets once daily - alprazolam 2mg , 1 tablet three times daily as needed . Low back pain . Continue: Hydrocodone/ acetaminophen (Norco) 10/325mg , 1 tablet every six hours as needed for moderate pain  . Insomnia . Continue follow up appointments with Dr. Marland [name].  . Continue:  - hydroxyzine 50mg , 1 tablet at bedtime  - Melatonin 3mg , 1 tablet at bedtime  Patient Self Care Activities:  . Self administers medications as prescribed, Calls pharmacy for medication refills, and Calls provider office for new concerns or questions  Initial goal documentation        Hypertension   Office blood pressures are  BP Readings from Last 3 Encounters:  07/14/19 112/70  07/01/19 130/70  06/03/19 130/82   Patient has failed these meds in the past: none  Patient checks BP at home: does not check at home   Patient is controlled on:  - no medications  - patient stated stress affects BP.   We discussed diet and exercise extensively.  [name] [name] DASH diet:  following a diet emphasizing fruits and vegetables and low-fat dairy products along with whole grains, fish, poultry, and nuts. Reducing red meats and sugars.   . Patient reports taking kale, brocolli, eggs, chicken, fish (does not eat bread, hot dogs)  . Exercising   . Reducing the amount of salt intake to 1500mg /per day.  . Recommend using a salt substitute to replace your salt if you need flavor.    . Weight reduction- We discussed losing 5-10% of body weight.    Plan Managed by Dr. Fransico Him Continue control with diet and exercise   Hyperlipidemia   Lipid Panel     Component Value Date/Time   CHOL 235 (H) 03/10/2018 1035    TRIG 246.0 (H) 03/10/2018 1035   HDL 47.30 03/10/2018 1035   CHOLHDL 5 03/10/2018 1035   VLDL 49.2 (H) 03/10/2018 1035   LDLCALC 149 (H) 11/08/2017 1634   LDLDIRECT 146.0 03/10/2018 1035    The 10-year ASCVD risk score Mikey Bussing DC Jr., et al., 2013) is: 8.6%   Values used to calculate the score:     Age: 39 years     Sex: Female     Is Non-Hispanic African American: No     Diabetic: No     Tobacco smoker: Yes     Systolic Blood Pressure: 371 mmHg     Is BP treated: No     HDL Cholesterol: 47.3 mg/dL     Total Cholesterol: 235 mg/dL   Patient has failed these meds in past: none  Patient is currently uncontrolled on the following medications: - simvastatin 80mg , 1 tablet once daily   We discussed:  diet and exercise extensively  . How to reduce cholesterol through diet/weight management and physical activity.    . We discussed how a diet high in plant sterols (fruits/vegetables/nuts/whole grains/legumes) may reduce your cholesterol.   Plan Patient is due for lipid panel.  Patient states she received a [facility] where they obtained a lipid panel. Will search medical records.  Continue current medications  GERD/ PUD   Patient has failed these meds in past: sucralfate  Patient is currently controlled on the following medications:  - omeprazole 20mg , 1 tablet twice daily before a meal   We discussed:  non-pharmacological interventions for acid reflux. Take measures to prevent acid reflux, such as avoiding spicy foods, avoiding caffeine, avoid laying down a few hours after eating, and raising the head of the bed.  - Patient aware of trigger food and aims to avoid them.  - Patient tried stopping omeprazole abruptly in the past.  - Patient reported taking once at night (depending on symptoms may take twice daily).    Plan Continue current medications  ADHD   Patient has failed these meds in past: Adderall XR (insomnia) Patient is currently controlled on the following medications:    - amphetamine- dextroamphetamine (Adderall) 10mg , 1 tablet once daily with breakfast     - patient reported improved focus.   Plan Continue current medications.   Allergic rhinitis   Patient has failed these meds in past: none  Patient is currently controlled on the following medications:  - azelastine (Astelin) 0.1% nasal spray, 2 sprays into both nostrils twice daily as directed   Plan Continue current medications.  Anxiety/ depression   Patient has failed these meds in past: Lithium, Seroquel, and others Patient is currently controlled on the following medications:  - fluoxetine 10mg , 2 tablets once daily - alprazolam 2mg , 1 tablet three times daily as needed (patient reports taking about 1.5 tablets/ day)    Plan Continue current medications  Low back pain   Patient is currently controlled on the following medications: meloxicam Hydrocodone/ acetaminophen (Norco) 10/325mg , 1 tablet every six hours as needed for moderate pain (patient reports usage depends on activities of the day; some days she may take every six hours, other days she might not at all)   Plan Continue current medications.  Insomnia   Patient has failed these meds in past: Seroquel  Patient is currently controlled on the following medications:  - hydroxyzine 50mg , 1 tablet at bedtime  - Inspire device  - melatonin 3mg , 1 tablet at bedtime   Plan Continue current medications.   Hypokalemia   Patient is currently controlled on the following medications:  - potassium chloride (Klor-Con) , 1 tablet once daily   Potassium 07/21/2018: 3.3 03/23/2019: 4.2   Plan Continue current medications.   Medication Management  Patient organizes medications: patient uses pill box  Barriers: patient reports tries not to drive as much due to Meniere's. she reports her spouse drives for her. Denies financial barriers in obtaining medications.  Adherence: no gaps in refill history (per medication dispense  history from 03/25/2019 to 09/04/2019) except:  Omeprazole, last filled 08/07/19, 30DS- patient reported not taking BID for most days (takes once daily).    Follow up Follow up visit with PharmD in 6 months.   Will conduct general telephone calls for periodic check-ins before next visit.      05/23/2019, PharmD Clinical Pharmacist [facility] [place] [phone]

## 2019-09-22 ENCOUNTER — Other Ambulatory Visit: Payer: Self-pay

## 2019-09-22 ENCOUNTER — Ambulatory Visit: Payer: PPO

## 2019-09-22 DIAGNOSIS — G8929 Other chronic pain: Secondary | ICD-10-CM

## 2019-09-22 DIAGNOSIS — J309 Allergic rhinitis, unspecified: Secondary | ICD-10-CM

## 2019-09-22 DIAGNOSIS — I10 Essential (primary) hypertension: Secondary | ICD-10-CM

## 2019-09-22 DIAGNOSIS — E785 Hyperlipidemia, unspecified: Secondary | ICD-10-CM

## 2019-09-22 DIAGNOSIS — M5440 Lumbago with sciatica, unspecified side: Secondary | ICD-10-CM

## 2019-09-22 DIAGNOSIS — F419 Anxiety disorder, unspecified: Secondary | ICD-10-CM

## 2019-09-22 DIAGNOSIS — K219 Gastro-esophageal reflux disease without esophagitis: Secondary | ICD-10-CM

## 2019-09-22 DIAGNOSIS — G4700 Insomnia, unspecified: Secondary | ICD-10-CM

## 2019-09-22 NOTE — Telephone Encounter (Signed)
Done

## 2019-09-22 NOTE — Addendum Note (Signed)
Addended by: Gershon Crane A on: 09/22/2019 05:11 PM   Modules accepted: Orders

## 2019-09-25 NOTE — Patient Instructions (Addendum)
Visit Information  Goals Addressed            This Visit's Progress   . Pharmacy Care Plan       CARE PLAN ENTRY Current Barriers:  . Chronic Disease Management support, education, and care coordination needs related to HTN, HLD, and GERD, ADHD, Allergic rhinitis, low back pain   Pharmacist Clinical Goal(s):  [name] Over next 6 months, patient will work on the following goals:  . Blood pressure:  [name] Maintain Blood pressure <130/80 mmHg  . Maintain low salt diet.  . High cholesterol:  . Cholesterol goals: Total Cholesterol goal under 200, Triglycerides goal under 150, HDL goal above 40 (men) or above 50 (women), LDL goal under 100.  [name] GERD/ PUD . Minimize reflux symptoms.   Interventions: . Comprehensive medication review performed. . Blood pressure:  . Discussed need to continue checking blood pressure at home.  . Discussed diet modifications. DASH diet:  following a diet emphasizing fruits and vegetables and low-fat dairy products along with whole grains, fish, poultry, and nuts. Reducing red meats and sugars.  . Exercising   . Reducing the amount of salt intake to 1500mg /per day.  . Recommend using a salt substitute to replace your salt if you need flavor.    . Getting enough potassium in your diet equaling 3500-5000mg /day.  This helps to regulate BP by balancing out the effects of salt.   . Weight reduction- We discussed losing 5-10% of body weight . Continue: control with diet and exercise.  . High cholesterol . How to reduce cholesterol through diet/weight management and physical activity.    . We discussed how a diet high in plant sterols (fruits/vegetables/nuts/whole grains/legumes) may reduce your cholesterol.  Encouraged increasing fiber to a daily intake of 10-25g/day  . Continue:  simvastatin 80mg , 1 tablet once daily  . GERD: . Discussed non-pharmacological interventions for acid reflux. Take measures to prevent acid reflux, such as avoiding spicy foods, avoiding  caffeine, avoid laying down a few hours after eating, and raising the head of the bed. . Continue: omeprazole 20mg , 1 tablet twice daily before a meal  . ADHD . Continue: amphetamine- dextroamphetamine (Adderall) 10mg , 1 tablet once daily with breakfast   . Anxiety/ depression . Continue:  - fluoxetine 10mg , 2 tablets once daily - alprazolam 2mg , 1 tablet three times daily as needed . Low back pain . Continue: Hydrocodone/ acetaminophen (Norco) 10/325mg , 1 tablet every six hours as needed for moderate pain  . Insomnia . Continue follow up appointments with Dr. .  . Continue:  - hydroxyzine 50mg , 1 tablet at bedtime  - Melatonin 3mg , 1 tablet at bedtime  Patient Self Care Activities:  . Self administers medications as prescribed, Calls pharmacy for medication refills, and Calls provider office for new concerns or questions  Initial goal documentation        [name] [name] was given information about Chronic Care Management services today including:  1. CCM service includes personalized support from designated clinical staff supervised by her physician, including individualized plan of care and coordination with other care providers 2. 24/7 contact phone numbers for assistance for urgent and routine care needs. 3. Standard insurance, coinsurance, copays and deductibles apply for chronic care management only during months in which we provide at least 20 minutes of these services. Most insurances cover these services at 100%, however patients may be responsible for any copay, coinsurance and/or deductible if applicable. This service may help you avoid the need for more expensive face-to-face  services. 4. Only one practitioner may furnish and bill the service in a calendar month. 5. The patient may stop CCM services at any time (effective at the end of the month) by phone call to the office staff.  Patient agreed to services and verbal consent obtained.   The patient verbalized  understanding of instructions provided today and agreed to receive a mailed copy of patient instruction and/or educational materials. Telephone follow up appointment with pharmacy team member scheduled for: 03/22/2020  Anson Crofts, PharmD Clinical Pharmacist [facility] [place] [phone]     Heart-Healthy Eating Plan Heart-healthy meal planning includes:  Eating less unhealthy fats.  Eating more healthy fats.  Making other changes in your diet. Talk with your doctor or a diet specialist (dietitian) to create an eating plan that is right for you. What is my plan? Your doctor may recommend an eating plan that includes:  Total fat: ______% or less of total calories a day.  Saturated fat: ______% or less of total calories a day.  Cholesterol: less than _________mg a day. What are tips for following this plan? Cooking Avoid frying your food. Try to bake, boil, grill, or broil it instead. You can also reduce fat by:  Removing the skin from poultry.  Removing all visible fats from meats.  Steaming vegetables in water or broth. Meal planning   At meals, divide your plate into four equal parts: ? Fill one-half of your plate with vegetables and green salads. ? Fill one-fourth of your plate with whole grains. ? Fill one-fourth of your plate with lean protein foods.  Eat 4-5 servings of vegetables per day. A serving of vegetables is: ? 1 cup of raw or cooked vegetables. ? 2 cups of raw leafy greens.  Eat 4-5 servings of fruit per day. A serving of fruit is: ? 1 medium whole fruit. ?  cup of dried fruit. ?  cup of fresh, frozen, or canned fruit. ?  cup of 100% fruit juice.  Eat more foods that have soluble fiber. These are apples, broccoli, carrots, beans, peas, and barley. Try to get 20-30 g of fiber per day.  Eat 4-5 servings of nuts, legumes, and seeds per week: ? 1 serving of dried beans or legumes equals  cup after being cooked. ? 1  serving of nuts is  cup. ? 1 serving of seeds equals 1 tablespoon. General information  Eat more home-cooked food. Eat less restaurant, buffet, and fast food.  Limit or avoid alcohol.  Limit foods that are high in starch and sugar.  Avoid fried foods.  Lose weight if you are overweight.  Keep track of how much salt (sodium) you eat. This is important if you have high blood pressure. Ask your doctor to tell you more about this.  Try to add vegetarian meals each week. Fats  Choose healthy fats. These include olive oil and canola oil, flaxseeds, walnuts, almonds, and seeds.  Eat more omega-3 fats. These include salmon, mackerel, sardines, tuna, flaxseed oil, and ground flaxseeds. Try to eat fish at least 2 times each week.  Check food labels. Avoid foods with trans fats or high amounts of saturated fat.  Limit saturated fats. ? These are often found in animal products, such as meats, butter, and cream. ? These are also found in plant foods, such as palm oil, palm kernel oil, and coconut oil.  Avoid foods with partially hydrogenated oils in them. These have trans fats. Examples are stick margarine, some  tub margarines, cookies, crackers, and other baked goods. What foods can I eat? Fruits All fresh, canned (in natural juice), or frozen fruits. Vegetables Fresh or frozen vegetables (raw, steamed, roasted, or grilled). Green salads. Grains Most grains. Choose whole wheat and whole grains most of the time. Rice and pasta, including brown rice and pastas made with whole wheat. Meats and other proteins Lean, well-trimmed beef, veal, pork, and lamb. Chicken and [place] without skin. All fish and shellfish. Wild duck, rabbit, pheasant, and venison. Egg whites or low-cholesterol egg substitutes. Dried beans, peas, lentils, and tofu. Seeds and most nuts. Dairy Low-fat or nonfat cheeses, including ricotta and mozzarella. Skim or 1% milk that is liquid, powdered, or evaporated. Buttermilk  that is made with low-fat milk. Nonfat or low-fat yogurt. Fats and oils Non-hydrogenated (trans-free) margarines. Vegetable oils, including soybean, sesame, sunflower, olive, peanut, safflower, corn, canola, and cottonseed. Salad dressings or mayonnaise made with a vegetable oil. Beverages Mineral water. Coffee and tea. Diet carbonated beverages. Sweets and desserts Sherbet, gelatin, and fruit ice. Small amounts of dark chocolate. Limit all sweets and desserts. Seasonings and condiments All seasonings and condiments. The items listed above may not be a complete list of foods and drinks you can eat. Contact a dietitian for more options. What foods should I avoid? Fruits Canned fruit in heavy syrup. Fruit in cream or butter sauce. Fried fruit. Limit coconut. Vegetables Vegetables cooked in cheese, cream, or butter sauce. Fried vegetables. Grains Breads that are made with saturated or trans fats, oils, or whole milk. Croissants. Sweet rolls. Donuts. High-fat crackers, such as cheese crackers. Meats and other proteins Fatty meats, such as hot dogs, ribs, sausage, bacon, rib-eye roast or steak. High-fat deli meats, such as salami and bologna. Caviar. Domestic duck and goose. Organ meats, such as liver. Dairy Cream, sour cream, cream cheese, and creamed cottage cheese. Whole-milk cheeses. Whole or 2% milk that is liquid, evaporated, or condensed. Whole buttermilk. Cream sauce or high-fat cheese sauce. Yogurt that is made from whole milk. Fats and oils Meat fat, or shortening. Cocoa butter, hydrogenated oils, palm oil, coconut oil, palm kernel oil. Solid fats and shortenings, including bacon fat, salt pork, lard, and butter. Nondairy cream substitutes. Salad dressings with cheese or sour cream. Beverages Regular sodas and juice drinks with added sugar. Sweets and desserts Frosting. Pudding. Cookies. Cakes. Pies. Milk chocolate or white chocolate. Buttered syrups. Full-fat ice cream or ice cream  drinks. The items listed above may not be a complete list of foods and drinks to avoid. Contact a dietitian for more information. Summary  Heart-healthy meal planning includes eating less unhealthy fats, eating more healthy fats, and making other changes in your diet.  Eat a balanced diet. This includes fruits and vegetables, low-fat or nonfat dairy, lean protein, nuts and legumes, whole grains, and heart-healthy oils and fats. This information is not intended to replace advice given to you by your health care provider. Make sure you discuss any questions you have with your health care provider. Document Revised: 08/08/2017 Document Reviewed: 07/12/2017 Elsevier Patient Education  2020 [employer].

## 2019-10-01 ENCOUNTER — Telehealth: Payer: Self-pay | Admitting: Cardiology

## 2019-10-01 NOTE — Telephone Encounter (Signed)
Left message for patient to call back  

## 2019-10-01 NOTE — Telephone Encounter (Signed)
Patient is returning call regarding clearance for her husband to accompany her during appointment scheduled for 10/06/19 with Dr. Mayford Knife.

## 2019-10-01 NOTE — Telephone Encounter (Signed)
She states a detailed voice message may be left if she is not available.

## 2019-10-01 NOTE — Telephone Encounter (Signed)
Patient states that she has Menire's disease and is a very high fall risk. I advised her that it would be okay for her husband to come up to her appt with her.

## 2019-10-01 NOTE — Telephone Encounter (Signed)
New Message     Pt is calling  and says her husband will need to assist the pt to her appt because she falls easily    Please advise

## 2019-10-06 ENCOUNTER — Ambulatory Visit: Payer: PPO | Admitting: Cardiology

## 2019-10-06 ENCOUNTER — Telehealth: Payer: Self-pay | Admitting: *Deleted

## 2019-10-06 ENCOUNTER — Other Ambulatory Visit: Payer: Self-pay

## 2019-10-06 DIAGNOSIS — I10 Essential (primary) hypertension: Secondary | ICD-10-CM | POA: Diagnosis not present

## 2019-10-06 DIAGNOSIS — E669 Obesity, unspecified: Secondary | ICD-10-CM

## 2019-10-06 DIAGNOSIS — G4733 Obstructive sleep apnea (adult) (pediatric): Secondary | ICD-10-CM

## 2019-10-06 NOTE — Telephone Encounter (Signed)
-----   Message from Theresia Majors, RN sent at 10/06/2019  1:48 PM EDT ----- Home sleep study has been ordered.  Thanks!

## 2019-10-06 NOTE — Patient Instructions (Signed)
Medication Instructions:  Your physician recommends that you continue on your current medications as directed. Please refer to the Current Medication list given to you today. *If you need a refill on your cardiac medications before your next appointment, please call your pharmacy*   Testing/Procedures: Your physician has recommended that you have a sleep study. This test records several body functions during sleep, including: brain activity, eye movement, oxygen and carbon dioxide blood levels, heart rate and rhythm, breathing rate and rhythm, the flow of air through your mouth and nose, snoring, body muscle movements, and chest and belly movement.   Follow-Up: At [facility], you and your health needs are our priority.  As part of our continuing mission to provide you with exceptional heart care, we have created designated Provider Care Teams.  These Care Teams include your primary Cardiologist (physician) and Advanced Practice Providers (APPs -  Physician Assistants and Nurse Practitioners) who all work together to provide you with the care you need, when you need it.  Your next appointment:   1 year(s)  The format for your next appointment:   Virtual Visit   Provider:   Traci Turner, MD  

## 2019-10-06 NOTE — Progress Notes (Signed)
Cardiology Office Note:    Date:  10/06/2019   ID:  [name] [name], DOB 1955-11-04, MRN [id number]  PCP:  Nelwyn Salisbury, MD  Cardiologist:  No primary care provider on file.    Referring MD: Nelwyn Salisbury, MD   Chief Complaint  Patient presents with  . Sleep Apnea    History of Present Illness:    [name] [name] is a 64 y.o. female with a hx of OSA on CPAP therapy. Unfortunately she was intolerant to CPAP and requested ENT evaluation of Inspire. Device. She underwent hypoglossal nerve stimulator implant by Dr. Jenne Pane on 03/25/2019. She was seen back on 05/01/2019 for device activation.  She had her 4 week check after activation  and complained that she was having problems with her tongue burning.  She said that this occured at night when she would get up to go to the bathroom or just wake up and then she felt discomfort in her tongue as it was stimulated and moved forward.  She underwent in lab sleep study and the recommended amplitude for the Inspire device was 2.1V.    She is now back today for reevaluation and fine tuning of her Inspire device.  She has been having problems with feeling very sleepy and feels her device needs to be reprogrammed.  She says that she is snoring and not sleeping well.    Past Medical History:  Diagnosis Date  . Allergy    seasonal  . Anxiety   . Arthritis   . Bell's palsy 2008   pt states she had a 'mini stroke' and nervous breakdown which caused facial drooping  . Bipolar 1 disorder (HCC)   . CPAP (continuous positive airway pressure) dependence   . Depression    sees the [facility]   . GERD (gastroesophageal reflux disease)   . Hyperlipidemia   . Hypertension    meds no longer needed  . Memory loss    short term  . Meniere disease   . Obesity (BMI 30-39.9) 11/10/2015  . Post concussion syndrome 07/01/2018  . PPD positive 2007 or 2008  . Sleep apnea    on CPAP therapy  . Urinary incontinence   . Vestibular disorder   .  Wears dentures   . Wears glasses     Past Surgical History:  Procedure Laterality Date  . ABDOMINAL HYSTERECTOMY    . ANTERIOR FUSION CERVICAL SPINE  2006   Dr. Wynetta Emery  . BREAST BIOPSY Right 1981   benign  . BREAST EXCISIONAL BIOPSY Right 1981   benign   scar does not show  . BREAST SURGERY Bilateral 12-11-12   reductions [facility] Dr. Ivin Booty in [facility]   . CATARACT EXTRACTION, BILATERAL Bilateral last summer of 2018   at [facility]  . COLONOSCOPY  04/25/2016   per Dr. Loreta Ave, adenomatous polyps, repeat in 5 yrs   . DRUG INDUCED ENDOSCOPY N/A 01/30/2019   Procedure: DRUG INDUCED ENDOSCOPY;  Surgeon: Christia Reading, MD;  Location: [facility];  Service: ENT;  Laterality: N/A;  . ESOPHAGOGASTRODUODENOSCOPY  04-23-11   per Dr. Loreta Ave, normal   . EYE SURGERY  06/18/2016   L eyelid   . IMPLANTATION OF HYPOGLOSSAL NERVE STIMULATOR Right 03/25/2019   Procedure: IMPLANTATION OF HYPOGLOSSAL NERVE STIMULATOR;  Surgeon: Christia Reading, MD;  Location: [facility];  Service: ENT;  Laterality: Right;  . LUMBAR DISC SURGERY  2003   Dr. Wynetta Emery  . MULTIPLE TOOTH EXTRACTIONS    .  ORIF WRIST FRACTURE Right 08/29/2018  . REDUCTION MAMMAPLASTY Bilateral 2013  . SPLIT NIGHT STUDY  11/11/2015  . UVULOPALATOPHARYNGOPLASTY    . VAGINAL HYSTERECTOMY  12/96   secondary to fibroids.    Current Medications: No outpatient medications have been marked as taking for the 10/06/19 encounter (Office Visit) with [name], MD.     Allergies:   Sulfonamide derivatives, Flexeril [cyclobenzaprine], Hydromorphone, Morphine and related, and Sulfamethoxazole   Social History   Socioeconomic History  . Marital status: Divorced    Spouse name: Not on file  . Number of children: 2  . Years of education: Not on file  . Highest education level: Associate degree: academic program  Occupational History  . Occupation: Disabled  Tobacco Use  . Smoking status: Light Tobacco Smoker    Years: 30.00    Types:  Cigarettes  . Smokeless tobacco: Never Used  . Tobacco comment: " 1 cigarette every 2 days- maybe "  Substance and Sexual Activity  . Alcohol use: No    Alcohol/week: 0.0 standard drinks  . Drug use: No  . Sexual activity: Not Currently    Partners: Male    Birth control/protection: Surgical  Other Topics Concern  . Not on file  Social History Narrative   Pt lives in [place], [place].   Right handed    Occasional use of caffeine   Lives with ex-husband         07/10/2018: Lives with ex-husband on one-level house. Divorced, according to husband, for financial benefit only    Has two children, one local and one in [place]. Has several grandchildren locally who can help her occasionally if needed.   Social Determinants of Health   Financial Resource Strain: Low Risk   . Difficulty of Paying Living Expenses: Not hard at all  Food Insecurity: No Food Insecurity  . Worried About Programme researcher, broadcasting/film/video in the Last Year: Never true  . Ran Out of Food in the Last Year: Never true  Transportation Needs: No Transportation Needs  . Lack of Transportation (Medical): No  . Lack of Transportation (Non-Medical): No  Physical Activity: Inactive  . Days of Exercise per Week: 0 days  . Minutes of Exercise per Session: 0 min  Stress: Stress Concern Present  . Feeling of Stress : To some extent  Social Connections: Slightly Isolated  . Frequency of Communication with Friends and Family: More than three times a week  . Frequency of Social Gatherings with Friends and Family: Once a week  . Attends Religious Services: More than 4 times per year  . Active Member of Clubs or Organizations: Yes  . Attends Banker Meetings: Not on file  . Marital Status: Divorced     Family History: The patient's family history includes Alcohol abuse in an other family member; Arthritis in an other family member; Asthma in her father; Coronary artery disease in her brother, sister, and another family member; Dementia  in her mother; Depression in an other family member; Diabetes in an other family member; Hyperlipidemia in her brother, mother, sister, sister, and another family member; Hypertension in her brother, sister, sister, and another family member; Sudden death in an other family member; Thyroid disease in her sister.  ROS:   Please see the history of present illness.    ROS  All other systems reviewed and negative.   EKGs/Labs/Other Studies Reviewed:    The following studies were reviewed today: Sleep study  EKG:  EKG is not  ordered today.   Recent Labs: 03/23/2019: BUN 9; Creatinine, Ser 0.76; Hemoglobin 12.6; Platelets 157; Potassium 4.2; Sodium 138   Recent Lipid Panel    Component Value Date/Time   CHOL 235 (H) 03/10/2018 1035   TRIG 246.0 (H) 03/10/2018 1035   HDL 47.30 03/10/2018 1035   CHOLHDL 5 03/10/2018 1035   VLDL 49.2 (H) 03/10/2018 1035   LDLCALC 149 (H) 11/08/2017 1634   LDLDIRECT 146.0 03/10/2018 1035    Physical Exam:    VS:  There were no vitals taken for this visit.    Wt Readings from Last 3 Encounters:  08/25/19 167 lb (75.8 kg)  07/14/19 172 lb (78 kg)  07/01/19 178 lb (80.7 kg)     GEN:  Well nourished, well developed in no acute distress HEENT: Normal NECK: No JVD; No carotid bruits MUSCULOSKELETAL:  No edema; No deformity  SKIN: Warm and dry NEUROLOGIC:  Alert and oriented x 3 PSYCHIATRIC:  Normal affect   ASSESSMENT:    1. Obstructive sleep apnea syndrome   2. Essential hypertension   3. Obesity (BMI 30-39.9)    PLAN:    In order of problems listed above:  1.  OSA -intolerant to CPAP therapy -s/p hypoglossal nerve stimulator -device was fine tuned at sleep study but now having issues with feeling it is not strong enough and is not sleeping well at night -she has been having significant daytime sleepiness and snoring.   -Hypoglossal nerve stimulation was performed today and results as follows:  Multiple stimulation levels tested  including 1.2V therapy (functional 1V) with electrode -/-/+, 2.5V (functional 1.7V) with electrode +/-/+ and 1.5V (functional 1V) with electrode -/+  Followup stimulation settings:   Amplitude increased from 1.7V to 2.5V   Patient control increased from 1.3-2.3V to 2.3-3.3V   Pulse Width 38mcsec   Rate 33Hz    Start delay 30 min   Pause time 30 min   Therapy Duratoin 12 hours   Electrodes +/-/+ -she will have an in lab sleep study done in a few weeks   2.  HTN -BP controlled on exam -has not required any antihypertensive therapy  3.  Obesity -I have encouraged her to get into a routine exercise program and cut back on carbs and portions.     Medication Adjustments/Labs and Tests Ordered: Current medicines are reviewed at length with the patient today.  Concerns regarding medicines are outlined above.  Orders Placed This Encounter  Procedures  . Home sleep test   No orders of the defined types were placed in this encounter.   Signed, Fransico Him, MD  10/06/2019 4:47 PM    [place]

## 2019-10-21 ENCOUNTER — Encounter: Payer: Self-pay | Admitting: Family Medicine

## 2019-10-21 NOTE — Telephone Encounter (Signed)
Please ask her exactly what day she will leave and what day she will return

## 2019-10-23 NOTE — Telephone Encounter (Signed)
Tell her that once she gets to her daughter's place, find out the name, address, and phone number of the pharmacy and let us know. Then I can send the meds straight to the pharmacy there

## 2019-10-30 ENCOUNTER — Telehealth: Payer: Self-pay | Admitting: Family Medicine

## 2019-10-30 MED ORDER — AMPHETAMINE-DEXTROAMPHETAMINE 10 MG PO TABS: 10.0000 mg | ORAL_TABLET | Freq: Every day | ORAL | 0 refills | Status: DC

## 2019-10-30 MED ORDER — HYDROCODONE-ACETAMINOPHEN 10-325 MG PO TABS: 1.0000 | ORAL_TABLET | Freq: Four times a day (QID) | ORAL | 0 refills | Status: DC | PRN

## 2019-10-30 NOTE — Telephone Encounter (Signed)
Done

## 2019-10-30 NOTE — Telephone Encounter (Signed)
Last filled 10/01/2019 Last OV 08/03/2019  Ok to fill?

## 2019-10-30 NOTE — Telephone Encounter (Signed)
Patient is aware 

## 2019-10-30 NOTE — Telephone Encounter (Signed)
Patient needs Aderrall and Hydrocodone refilled.  Patient is in [place].  [facility] pharmacy-  [facility] [address] [place], [place]  [address] [phone]  Secondary number for patient- [phone]

## 2019-11-05 ENCOUNTER — Telehealth: Payer: Self-pay | Admitting: Family Medicine

## 2019-11-05 NOTE — Telephone Encounter (Signed)
Pt call and stated she didn't get her refills onamphetamine-dextroamphetamine (ADDERALL) 10 MG tablet and HYDROcodone-acetaminophen (NORCO) 10-325 MG tablet and want them sent to  [facility] - [place], [place] - [address] Phone:  [phone]  Fax:  [phone]

## 2019-11-05 NOTE — Telephone Encounter (Signed)
Pt is calling in stating that her 2 Rx's (amphetamine-dextroamphetamine (ADDERALL) 10 MG and Hydrocodone-acetaminophen (NORCO) 10-325 MG that were sent to [place] and it need to be sent to [facility] on [address] in [place], [place].  Pt stated that she had been without her medication for 6 days and it is not good.  Pt is aware that the provider and his assist has left for today and will address it on 11/06/2019 when they return back into the office.

## 2019-11-06 ENCOUNTER — Other Ambulatory Visit: Payer: Self-pay

## 2019-11-06 ENCOUNTER — Emergency Department (HOSPITAL_BASED_OUTPATIENT_CLINIC_OR_DEPARTMENT_OTHER): Payer: PPO

## 2019-11-06 ENCOUNTER — Other Ambulatory Visit: Payer: Self-pay | Admitting: Family Medicine

## 2019-11-06 ENCOUNTER — Emergency Department (HOSPITAL_BASED_OUTPATIENT_CLINIC_OR_DEPARTMENT_OTHER)
Admission: EM | Admit: 2019-11-06 | Discharge: 2019-11-07 | Disposition: A | Discharge status: Home / Self Care | Payer: PPO | Attending: Emergency Medicine | Admitting: Emergency Medicine

## 2019-11-06 ENCOUNTER — Encounter (HOSPITAL_BASED_OUTPATIENT_CLINIC_OR_DEPARTMENT_OTHER): Payer: Self-pay

## 2019-11-06 DIAGNOSIS — Y92008 Other place in unspecified non-institutional (private) residence as the place of occurrence of the external cause: Secondary | ICD-10-CM | POA: Insufficient documentation

## 2019-11-06 DIAGNOSIS — W01118A Fall on same level from slipping, tripping and stumbling with subsequent striking against other sharp object, initial encounter: Secondary | ICD-10-CM | POA: Insufficient documentation

## 2019-11-06 DIAGNOSIS — Y999 Unspecified external cause status: Secondary | ICD-10-CM | POA: Diagnosis not present

## 2019-11-06 DIAGNOSIS — I10 Essential (primary) hypertension: Secondary | ICD-10-CM | POA: Insufficient documentation

## 2019-11-06 DIAGNOSIS — F17210 Nicotine dependence, cigarettes, uncomplicated: Secondary | ICD-10-CM | POA: Insufficient documentation

## 2019-11-06 DIAGNOSIS — Y9389 Activity, other specified: Secondary | ICD-10-CM | POA: Diagnosis not present

## 2019-11-06 DIAGNOSIS — S91311A Laceration without foreign body, right foot, initial encounter: Secondary | ICD-10-CM | POA: Diagnosis not present

## 2019-11-06 DIAGNOSIS — Z79899 Other long term (current) drug therapy: Secondary | ICD-10-CM | POA: Insufficient documentation

## 2019-11-06 DIAGNOSIS — Y92009 Unspecified place in unspecified non-institutional (private) residence as the place of occurrence of the external cause: Secondary | ICD-10-CM

## 2019-11-06 DIAGNOSIS — W19XXXA Unspecified fall, initial encounter: Secondary | ICD-10-CM

## 2019-11-06 HISTORY — DX: Other specified behavioral and emotional disorders with onset usually occurring in childhood and adolescence: F98.8

## 2019-11-06 HISTORY — DX: Opioid use, unspecified, uncomplicated: F11.90

## 2019-11-06 MED ORDER — AMPHETAMINE-DEXTROAMPHETAMINE 10 MG PO TABS: 10.0000 mg | ORAL_TABLET | Freq: Every day | ORAL | 0 refills | Status: DC

## 2019-11-06 MED ORDER — LIDOCAINE-EPINEPHRINE-TETRACAINE (LET) TOPICAL GEL
3.0000 mL | Freq: Once | TOPICAL | Status: AC
  Administered 2019-11-06: 3 mL via TOPICAL
  Filled 2019-11-06: qty 3

## 2019-11-06 MED ORDER — LIDOCAINE HCL 2 % IJ SOLN
10.0000 mL | Freq: Once | INTRAMUSCULAR | Status: AC
  Administered 2019-11-07: 200 mg via INTRADERMAL
  Filled 2019-11-06: qty 20

## 2019-11-06 MED ORDER — HYDROCODONE-ACETAMINOPHEN 10-325 MG PO TABS: 1.0000 | ORAL_TABLET | ORAL | 0 refills | Status: AC | PRN

## 2019-11-06 NOTE — ED Triage Notes (Addendum)
Pt states she fell ~45 min PTA- cut bottom of right great toe on vacuum cleaner-lac noted-gauze/kling applied-pain "all over"

## 2019-11-06 NOTE — Telephone Encounter (Signed)
Sent #30 of each to [facility]. That's all I can do for now. She will need to have a PMV with when I get back to the office for further refills

## 2019-11-06 NOTE — ED Provider Notes (Signed)
[facility] EMERGENCY DEPARTMENT Provider Note  CSN: [id number] Arrival date & time: 11/06/19 2144  Chief Complaint(s) Fall  HPI [name] [name] is a 64 y.o. female who presents for right foot laceration following mechanical fall that occurred earlier this evening.  Patient reports walking to the living room and hitting a vacuum cleaning which caused her to fall and caused a laceration to her foot.  Patient is endorsing foot pain and right wrist pain.  No headache, neck pain, back pain, chest pain, abdominal pain or other extremity pain.  Patient is not anticoagulated.  She has a history of Mnire's disease and has chronic vertiginous symptoms.  These are stable and unchanged.  HPI  Past Medical History Past Medical History:  Diagnosis Date  . ADD (attention deficit disorder)   . Allergy    seasonal  . Anxiety   . Arthritis   . Bell's palsy 2008   pt states she had a 'mini stroke' and nervous breakdown which caused facial drooping  . Bipolar 1 disorder (HCC)   . Chronic narcotic use   . CPAP (continuous positive airway pressure) dependence   . Depression    [facility]   . GERD (gastroesophageal reflux disease)   . Hyperlipidemia   . Hypertension    meds no longer needed  . Memory loss    short term  . Meniere disease   . Obesity (BMI 30-39.9) 11/10/2015  . Post concussion syndrome 07/01/2018  . PPD positive 2007 or 2008  . Sleep apnea    on CPAP therapy  . Urinary incontinence   . Vestibular disorder   . Wears dentures   . Wears glasses    Patient Active Problem List   Diagnosis Date Noted  . Hypokalemia 07/23/2018  . Post concussion syndrome 07/01/2018  . Neck pain 05/06/2017  . Angina pectoris (HCC) 06/06/2016  . PUD (peptic ulcer disease) 06/06/2016  . Attention deficit hyperactivity disorder (ADHD) 02/07/2016  . Obesity (BMI 30-39.9) 11/10/2015  . Balance disorder 03/02/2015  . Bipolar disorder (HCC) 12/13/2009  . MEMORY LOSS  07/01/2009  . BACK PAIN, LUMBAR 01/21/2009  . EDEMA LEG 01/21/2009  . HIDRADENITIS SUPPURATIVA 10/04/2008  . DIZZINESS 04/16/2008  . 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 Anxiety 01/23/2008  . DEPRESSION 01/23/2008  . Essential hypertension 01/23/2008  . Allergic rhinitis 01/23/2008  . GERD 01/23/2008  . INSOMNIA 01/23/2008  . URINARY INCONTINENCE 01/23/2008  . POSITIVE PPD 01/23/2008  . Hyperlipidemia 04/15/2007  . Sleep apnea 04/15/2007  . Headache 04/15/2007   Home Medication(s) Prior to Admission medications   Medication Sig Start Date End Date Taking? Authorizing Provider  acyclovir (ZOVIRAX) 400 MG tablet TAKE 1 TABLET BY MOUTH 4 TIMES A DAY AS NEEDED Patient taking differently: Take 400 mg by mouth 2 (two) times daily.  09/04/19   Nelwyn SalisburyFry, Stephen A, MD  alprazolam Prudy Feeler(XANAX) 2 MG tablet TAKE 1 TABLET BY MOUTH 3 TIMES A DAY AS NEEDED Patient taking differently: Take 1 mg by mouth as needed.  07/29/19   Nelwyn SalisburyFry, Stephen A, MD  amphetamine-dextroamphetamine (ADDERALL) 10 MG tablet Take 1 tablet (10 mg total) by mouth daily with breakfast. 11/06/19 12/06/19  Nelwyn SalisburyFry, Stephen A, MD  azelastine (ASTELIN) 0.1 % nasal spray PLACE 2 SPRAYS INTO BOTH NOSTRILS 2 TIMES DAILY. [facility] AS DIRECTED 03/27/19   Nelwyn SalisburyFry, Stephen A, MD  Biotin 5000 MCG TABS Take 5,000 mcg by mouth daily.    [provider]  Cholecalciferol (VITAMIN D3) 125  MCG (5000 UT) CAPS Take 5,000 Units by mouth daily.     [provider]  Ferrous Sulfate (IRON) 325 (65 Fe) MG TABS Take 1 tablet by mouth daily.    [provider]  FLUoxetine (PROZAC) 10 MG tablet TAKE 2 TABLETS (20 MG TOTAL) BY MOUTH DAILY. 04/06/19   Nelwyn Salisbury, MD  HYDROcodone-acetaminophen (NORCO) 10-325 MG tablet Take 1 tablet by mouth every 4 (four) hours as needed for up to 5 days for moderate pain. 11/06/19 11/11/19  Nelwyn Salisbury, MD  hydrOXYzine (ATARAX/VISTARIL) 50 MG tablet TAKE 1 TABLET BY MOUTH AT BEDTIME. NEED OFFICE VISIT FOR FURTHER  REFILLS 05/13/19   Nelwyn Salisbury, MD  Magnesium 500 MG CAPS Take 1 capsule by mouth daily.    [provider]  melatonin 3 MG TABS tablet Take 3 mg by mouth at bedtime.    [provider]  meloxicam (MOBIC) 15 MG tablet Take 15 mg by mouth daily.    [provider]  Multiple Vitamin (MULTIVITAMIN WITH MINERALS) TABS tablet Take 1 tablet by mouth daily.    [provider]  omeprazole (PRILOSEC) 20 MG capsule Take 20 mg by mouth 2 (two) times daily before a meal.    [provider]  polyethylene glycol (MIRALAX / GLYCOLAX) 17 g packet Take 17 g by mouth daily as needed.    [provider]  potassium chloride (KLOR-CON) 10 MEQ tablet TAKE 1 TABLET BY MOUTH DAILY. 08/21/19   Nelwyn Salisbury, MD  Probiotic Product (PROBIOTIC PO) Take 1 capsule by mouth daily.     [provider]  simvastatin (ZOCOR) 80 MG tablet Take 80 mg by mouth daily.    [provider]  VITAMIN E PO Take by mouth daily. Takes     [provider]                                                                                                                                    Past Surgical History Past Surgical History:  Procedure Laterality Date  . ABDOMINAL HYSTERECTOMY    . ANTERIOR FUSION CERVICAL SPINE  2006   Dr. Wynetta Emery  . BREAST BIOPSY Right 1981   benign  . BREAST EXCISIONAL BIOPSY Right 1981   benign   scar does not show  . BREAST SURGERY Bilateral 12-11-12   reductions per Dr. Ivin Booty in [facility]   . CATARACT EXTRACTION, BILATERAL Bilateral last summer of 2018   at [facility]  . COLONOSCOPY  04/25/2016   per Dr. Loreta Ave, adenomatous polyps, repeat in 5 yrs   . DRUG INDUCED ENDOSCOPY N/A 01/30/2019   Procedure: DRUG INDUCED ENDOSCOPY;  Surgeon: Christia Reading, MD;  Location: [facility];  Service: ENT;  Laterality: N/A;  . ESOPHAGOGASTRODUODENOSCOPY  04-23-11   per Dr. Loreta Ave, normal   . EYE SURGERY  06/18/2016    L eyelid   .  IMPLANTATION OF HYPOGLOSSAL NERVE STIMULATOR Right 03/25/2019   Procedure: IMPLANTATION OF HYPOGLOSSAL NERVE STIMULATOR;  Surgeon: Christia Reading, MD;  Location: [facility] OR;  Service: ENT;  Laterality: Right;  . LUMBAR DISC SURGERY  2003   Dr. Wynetta Emery  . MULTIPLE TOOTH EXTRACTIONS    . ORIF WRIST FRACTURE Right 08/29/2018  . REDUCTION MAMMAPLASTY Bilateral 2013  . SPLIT NIGHT STUDY  11/11/2015  . UVULOPALATOPHARYNGOPLASTY    . VAGINAL HYSTERECTOMY  12/96   secondary to fibroids.   Family History Family History  Problem Relation Age of Onset  . Dementia Mother   . Hyperlipidemia Mother   . Asthma Father   . Hypertension Sister   . Hyperlipidemia Sister   . Coronary artery disease Sister   . Thyroid disease Sister   . Hypertension Brother   . Hyperlipidemia Brother   . Coronary artery disease Brother   . Hypertension Sister   . Hyperlipidemia Sister   . Alcohol abuse Other        fhx  . Arthritis Other        fhx  . Coronary artery disease Other        fhx  . Depression Other        fhx  . Diabetes Other        fhx  . Hyperlipidemia Other        fhx  . Hypertension Other        fhx  . Sudden death Other        fhx    Social History Social History   Tobacco Use  . Smoking status: Light Tobacco Smoker    Years: 30.00    Types: Cigarettes  . Smokeless tobacco: Never Used  . Tobacco comment: " 1 cigarette every 2 days- maybe "  Substance Use Topics  . Alcohol use: No    Alcohol/week: 0.0 standard drinks  . Drug use: No   Allergies Sulfonamide derivatives, Flexeril [cyclobenzaprine], Hydromorphone, Morphine and related, and Sulfamethoxazole  Review of Systems Review of Systems All other systems are reviewed and are negative for acute change except as noted in the HPI  Physical Exam Vital Signs  I have reviewed the triage vital signs BP 115/77 (BP Location: Right Arm)   Pulse 73   Temp 98.1 F (36.7 C) (Oral)   Resp 16   SpO2 96%   Physical  Exam Constitutional:      General: She is not in acute distress.    Appearance: She is well-developed. She is not diaphoretic.  HENT:     Head: Normocephalic and atraumatic.     Right Ear: External ear normal.     Left Ear: External ear normal.     Nose: Nose normal.  Eyes:     General: No scleral icterus.       Right eye: No discharge.        Left eye: No discharge.     Conjunctiva/sclera: Conjunctivae normal.     Pupils: Pupils are equal, round, and reactive to light.  Cardiovascular:     Rate and Rhythm: Normal rate and regular rhythm.     Pulses:          Radial pulses are 2+ on the right side and 2+ on the left side.       Dorsalis pedis pulses are 2+ on the right side and 2+ on the left side.     Heart sounds: Normal heart sounds. No murmur. No friction rub. No gallop.  Pulmonary:     Effort: Pulmonary effort is normal. No respiratory distress.     Breath sounds: Normal breath sounds. No stridor. No wheezing.  Abdominal:     General: There is no distension.     Palpations: Abdomen is soft.     Tenderness: There is no abdominal tenderness.  Musculoskeletal:     Right wrist: Tenderness (mild) present. No swelling, deformity, bony tenderness or snuff box tenderness. Normal pulse.     Cervical back: Normal range of motion and neck supple. No bony tenderness.     Thoracic back: No bony tenderness.     Lumbar back: No bony tenderness.       Feet:     Comments: Clavicles stable. Chest stable to AP/Lat compression. Pelvis stable to Lat compression. No obvious extremity deformity. No chest or abdominal wall contusion.  Skin:    General: Skin is warm and dry.     Findings: No erythema or rash.  Neurological:     Mental Status: She is alert and oriented to person, place, and time.     Comments: Moving all extremities     Right small MTP laceration 1cm. ED Results and Treatments Labs (all labs ordered are listed, but only abnormal results are displayed) Labs Reviewed -  No data to display                                                                                                                       EKG  EKG Interpretation  Date/Time:    Ventricular Rate:    PR Interval:    QRS Duration:   QT Interval:    QTC Calculation:   R Axis:     Text Interpretation:        Radiology DG Foot 2 Views Right  Result Date: 11/07/2019 CLINICAL DATA:  Laceration between the fourth and fifth digits EXAM: RIGHT FOOT - 2 VIEW COMPARISON:  None. FINDINGS: Minimal soft tissue irregularity along the inter webspace between the fourth and fifth digit with some bandaging material separating the digits themselves. No soft tissue gas or foreign body. No acute bony abnormality. Specifically, no fracture, subluxation, or dislocation. Midfoot and hindfoot alignment is grossly preserved though incompletely assessed on nonweightbearing films. IMPRESSION: 1. Minimal soft tissue irregularity along the inter webspace between the fourth and fifth digit compatible with reported site of laceration with bandaging material separating the digits. 2. No soft tissue gas or foreign body. 3. No acute bony abnormality. Electronically Signed   By: Kreg Shropshire M.D.   On: 11/07/2019 00:28    Pertinent labs & imaging results that were available during my care of the patient were reviewed by me and considered in my medical decision making (see chart for details).  Medications Ordered in ED Medications  lidocaine-EPINEPHrine-tetracaine (LET) topical gel (3 mLs Topical Given 11/06/19 2352)  lidocaine (XYLOCAINE) 2 % (with pres) injection 200 mg (200 mg Intradermal Given 11/07/19 0208)  lidocaine-EPINEPHrine-tetracaine (LET) topical gel (3 mLs  Given  11/07/19 3614)                                                                                                                                    Procedures .Marland KitchenLaceration Repair  Date/Time: 11/07/2019 1:50 AM Performed by: Fatima Blank,  MD Authorized by: Fatima Blank, MD   Consent:    Consent obtained:  Verbal   Consent given by:  Patient   Risks discussed:  Poor cosmetic result, poor wound healing, nerve damage and vascular damage   Alternatives discussed:  Delayed treatment Anesthesia (see MAR for exact dosages):    Anesthesia method:  Topical application and local infiltration   Topical anesthetic:  LET   Local anesthetic:  Lidocaine 2% w/o epi Laceration details:    Location:  Foot   Foot location:  Sole of R foot   Length (cm):  1.2   Depth (mm):  3 Repair type:    Repair type:  Simple Pre-procedure details:    Preparation:  Patient was prepped and draped in usual sterile fashion and imaging obtained to evaluate for foreign bodies Exploration:    Hemostasis achieved with:  Direct pressure   Wound exploration: wound explored through full range of motion and entire depth of wound probed and visualized     Wound extent: no foreign bodies/material noted, no tendon damage noted and no vascular damage noted   Treatment:    Area cleansed with:  Betadine   Amount of cleaning:  Extensive   Irrigation solution:  Sterile saline   Irrigation volume:  350   Irrigation method:  Pressure wash Skin repair:    Repair method:  Sutures   Suture size:  4-0   Suture material:  Prolene   Suture technique:  Horizontal mattress   Number of sutures:  1 Approximation:    Approximation:  Close Post-procedure details:    Dressing:  Antibiotic ointment and non-adherent dressing   Patient tolerance of procedure:  Tolerated well, no immediate complications    (including critical care time)  Medical Decision Making / ED Course I have reviewed the nursing notes for this encounter and the patient's prior records (if available in EHR or on provided paperwork).   Getsemani Lindon was evaluated in Emergency Department on 11/07/2019 for the symptoms described in the history of present illness. She was evaluated in the  context of the global COVID-19 pandemic, which necessitated consideration that the patient might be at risk for infection with the SARS-CoV-2 virus that causes COVID-19. Institutional protocols and algorithms that pertain to the evaluation of patients at risk for COVID-19 are in a state of rapid change based on information released by regulatory bodies including the CDC and federal and state organizations. These policies and algorithms were followed during the patient's care in the ED.  Patient declined imaging of the right wrist.  Plain film of the right foot without foreign body or bony involvement.  Laceration thoroughly irrigated and closed as above.  Provided with postop shoe. She is up-to-date on her tetanus vaccination Return for suture removal.       Final Clinical Impression(s) / ED Diagnoses Final diagnoses:  Fall in home, initial encounter  Laceration of right foot, initial encounter   The patient appears reasonably screened and/or stabilized for discharge and I doubt any other medical condition or other [facility] requiring further screening, evaluation, or treatment in the ED at this time prior to discharge. Safe for discharge with strict return precautions.  Disposition: Discharge  Condition: Good  I have discussed the results, Dx and Tx plan with the patient/family who expressed understanding and agree(s) with the plan. Discharge instructions discussed at length. The patient/family was given strict return precautions who verbalized understanding of the instructions. No further questions at time of discharge.    ED Discharge Orders    None       Follow Up: Nelwyn Salisbury, MD [address] [place] [address] [phone]  Schedule an appointment as soon as possible for a visit    [facility]Y [facility]PARTMENT [address] [id number] mc [place] [address] [phone] Go to  For suture removal in 12-14  days      This chart was dictated using voice recognition software.  Despite best efforts to proofread,  errors can occur which can change the documentation meaning.   Nira Conn, MD 11/07/19 [phone]

## 2019-11-06 NOTE — Telephone Encounter (Signed)
Last filled 10/30/2019 Last OV 08/03/2019  Ok to fill?

## 2019-11-07 DIAGNOSIS — F17210 Nicotine dependence, cigarettes, uncomplicated: Secondary | ICD-10-CM | POA: Diagnosis not present

## 2019-11-07 DIAGNOSIS — S91311A Laceration without foreign body, right foot, initial encounter: Secondary | ICD-10-CM | POA: Diagnosis not present

## 2019-11-07 DIAGNOSIS — I10 Essential (primary) hypertension: Secondary | ICD-10-CM | POA: Diagnosis not present

## 2019-11-07 MED ORDER — LIDOCAINE-EPINEPHRINE-TETRACAINE (LET) TOPICAL GEL
TOPICAL | Status: AC
  Administered 2019-11-07: 3 mL
  Filled 2019-11-07: qty 3

## 2019-11-09 ENCOUNTER — Other Ambulatory Visit: Payer: Self-pay | Admitting: Family Medicine

## 2019-11-09 NOTE — Telephone Encounter (Signed)
Advised patient that her medication was called in on 05/21 and which pharmacy that it was sent into. I also scheduled her a PMV on June 1 MyChart visit.

## 2019-11-10 ENCOUNTER — Telehealth: Payer: Self-pay | Admitting: *Deleted

## 2019-11-10 NOTE — Telephone Encounter (Signed)
11/06/19: No auth required...CAS   Thank you,  Bertram Gala

## 2019-11-10 NOTE — Telephone Encounter (Signed)
Patient is aware and agreeable to Home Sleep Study through [facility]. Patient is scheduled for 7:30 at 9 am to pick up home sleep kit and meet with Respiratory therapist at [facility]. Patient is aware that if this appointment date and time does not work for them they should contact Artis Delay directly at [phone]. Patient is aware that a sleep packet will be sent from [facility] in week. Patient is agreeable to treatment and thankful for call.

## 2019-11-10 NOTE — Telephone Encounter (Signed)
-----   Message from Bertram Gala sent at 11/06/2019 11:25 AM EDT ----- 11/06/19: No auth required...CAS  Thank you, Bertram Gala ----- Message ----- From: Theresia Majors, RN Sent: 10/06/2019   1:48 PM EDT To: Reesa Chew, CMA, Cv Div Sleep Studies  Home sleep study has been ordered.  Thanks!

## 2019-11-10 NOTE — Telephone Encounter (Addendum)
Patient is aware and agreeable to Home Sleep Study through [facility]. Patient is scheduled for 01/15/20 at 9 am to pick up home sleep kit and meet with Respiratory therapist at [facility]. Patient is aware that if this appointment date and time does not work for them they should contact Artis Delay directly at [phone]. Patient is aware that a sleep packet will be sent from [facility] in week. Patient is agreeable to treatment and thankful for call.

## 2019-11-13 ENCOUNTER — Other Ambulatory Visit: Payer: Self-pay | Admitting: Family Medicine

## 2019-11-17 ENCOUNTER — Telehealth (INDEPENDENT_AMBULATORY_CARE_PROVIDER_SITE_OTHER): Payer: PPO | Admitting: Family Medicine

## 2019-11-17 DIAGNOSIS — M5440 Lumbago with sciatica, unspecified side: Secondary | ICD-10-CM

## 2019-11-17 DIAGNOSIS — G8929 Other chronic pain: Secondary | ICD-10-CM

## 2019-11-17 DIAGNOSIS — F1190 Opioid use, unspecified, uncomplicated: Secondary | ICD-10-CM

## 2019-11-17 MED ORDER — HYDROCODONE-ACETAMINOPHEN 10-325 MG PO TABS: 1.0000 | ORAL_TABLET | Freq: Four times a day (QID) | ORAL | 0 refills | Status: DC | PRN

## 2019-11-17 MED ORDER — HYDROCODONE-ACETAMINOPHEN 10-325 MG PO TABS: 1.0000 | ORAL_TABLET | Freq: Four times a day (QID) | ORAL | 0 refills | Status: AC | PRN

## 2019-11-18 ENCOUNTER — Encounter: Payer: Self-pay | Admitting: Family Medicine

## 2019-11-18 NOTE — Progress Notes (Signed)
Subjective:    Patient ID: [name] [name], female    DOB: March 24, 1956, 64 y.o.   MRN: [id number]  HPI Virtual Visit via Video Note  I connected with the patient on 11/18/19 at  1:15 PM EDT by a video enabled telemedicine application and verified that I am speaking with the correct person using two identifiers.  Location patient: home Location provider:work or home office Persons participating in the virtual visit: patient, provider  I discussed the limitations of evaluation and management by telemedicine and the availability of in person appointments. The patient expressed understanding and agreed to proceed.   HPI: Here for pain management, she is doing well. She recently returned from a trip to visit hier sister in [place], and she enjoyed this thoroughly.  Indication for chronic opioid: low back pain Medication and dose: Norco 10-325 # pills per month: 120 Last UDS date: 05-08-19 Opioid Treatment Agreement signed (Y/N): 09-17-17 Opioid Treatment Agreement last reviewed with patient:  11-17-19 NCCSRS reviewed this encounter (include red flags): Yes    ROS: See pertinent positives and negatives per HPI.  Past Medical History:  Diagnosis Date  . ADD (attention deficit disorder)   . Allergy    seasonal  . Anxiety   . Arthritis   . Bell's palsy 2008   pt states she had a 'mini stroke' and nervous breakdown which caused facial drooping  . Bipolar 1 disorder (HCC)   . Chronic narcotic use   . CPAP (continuous positive airway pressure) dependence   . Depression    sees the [facility]   . GERD (gastroesophageal reflux disease)   . Hyperlipidemia   . Hypertension    meds no longer needed  . Memory loss    short term  . Meniere disease   . Obesity (BMI 30-39.9) 11/10/2015  . Post concussion syndrome 07/01/2018  . PPD positive 2007 or 2008  . Sleep apnea    on CPAP therapy  . Urinary incontinence   . Vestibular disorder   . Wears dentures   . Wears glasses      Past Surgical History:  Procedure Laterality Date  . ABDOMINAL HYSTERECTOMY    . ANTERIOR FUSION CERVICAL SPINE  2006   Dr. Wynetta Emery  . BREAST BIOPSY Right 1981   benign  . BREAST EXCISIONAL BIOPSY Right 1981   benign   scar does not show  . BREAST SURGERY Bilateral 12-11-12   reductions per Dr. Ivin Booty in [facility]   . CATARACT EXTRACTION, BILATERAL Bilateral last summer of 2018   at [facility]  . COLONOSCOPY  04/25/2016   per Dr. Loreta Ave, adenomatous polyps, repeat in 5 yrs   . DRUG INDUCED ENDOSCOPY N/A 01/30/2019   Procedure: DRUG INDUCED ENDOSCOPY;  Surgeon: Christia Reading, MD;  Location: [facility];  Service: ENT;  Laterality: N/A;  . ESOPHAGOGASTRODUODENOSCOPY  04-23-11   per Dr. Loreta Ave, normal   . EYE SURGERY  06/18/2016   L eyelid   . IMPLANTATION OF HYPOGLOSSAL NERVE STIMULATOR Right 03/25/2019   Procedure: IMPLANTATION OF HYPOGLOSSAL NERVE STIMULATOR;  Surgeon: Christia Reading, MD;  Location: [facility] OR;  Service: ENT;  Laterality: Right;  . LUMBAR DISC SURGERY  2003   Dr. Wynetta Emery  . MULTIPLE TOOTH EXTRACTIONS    . ORIF WRIST FRACTURE Right 08/29/2018  . REDUCTION MAMMAPLASTY Bilateral 2013  . SPLIT NIGHT STUDY  11/11/2015  . UVULOPALATOPHARYNGOPLASTY    . VAGINAL HYSTERECTOMY  12/96   secondary to fibroids.    Family  History  Problem Relation Age of Onset  . Dementia Mother   . Hyperlipidemia Mother   . Asthma Father   . Hypertension Sister   . Hyperlipidemia Sister   . Coronary artery disease Sister   . Thyroid disease Sister   . Hypertension Brother   . Hyperlipidemia Brother   . Coronary artery disease Brother   . Hypertension Sister   . Hyperlipidemia Sister   . Alcohol abuse Other        fhx  . Arthritis Other        fhx  . Coronary artery disease Other        fhx  . Depression Other        fhx  . Diabetes Other        fhx  . Hyperlipidemia Other        fhx  . Hypertension Other        fhx  . Sudden death Other        fhx      Current Outpatient Medications:  .  acyclovir (ZOVIRAX) 400 MG tablet, TAKE 1 TABLET BY MOUTH 4 TIMES A DAY AS NEEDED (Patient taking differently: Take 400 mg by mouth 2 (two) times daily. ), Disp: 120 tablet, Rfl: 5 .  alprazolam (XANAX) 2 MG tablet, TAKE 1 TABLET BY MOUTH 3 TIMES A DAY AS NEEDED (Patient taking differently: Take 1 mg by mouth as needed. ), Disp: 90 tablet, Rfl: 5 .  amphetamine-dextroamphetamine (ADDERALL) 10 MG tablet, Take 1 tablet (10 mg total) by mouth daily with breakfast., Disp: 30 tablet, Rfl: 0 .  azelastine (ASTELIN) 0.1 % nasal spray, PLACE 2 SPRAYS INTO BOTH NOSTRILS 2 TIMES DAILY. USE IN EACH NOSTRIL AS DIRECTED, Disp: 30 mL, Rfl: 11 .  Biotin 5000 MCG TABS, Take 5,000 mcg by mouth daily., Disp: , Rfl:  .  Cholecalciferol (VITAMIN D3) 125 MCG (5000 UT) CAPS, Take 5,000 Units by mouth daily. , Disp: , Rfl:  .  Ferrous Sulfate (IRON) 325 (65 Fe) MG TABS, Take 1 tablet by mouth daily., Disp: , Rfl:  .  FLUoxetine (PROZAC) 10 MG tablet, TAKE 2 TABLETS (20 MG TOTAL) BY MOUTH DAILY., Disp: 60 tablet, Rfl: 11 .  [START ON 01/17/2020] HYDROcodone-acetaminophen (NORCO) 10-325 MG tablet, Take 1 tablet by mouth every 6 (six) hours as needed for moderate pain., Disp: 120 tablet, Rfl: 0 .  hydrOXYzine (ATARAX/VISTARIL) 50 MG tablet, TAKE 1 TABLET BY MOUTH AT BEDTIME. NEED OFFICE VISIT FOR FURTHER REFILLS, Disp: 30 tablet, Rfl: 11 .  Magnesium 500 MG CAPS, Take 1 capsule by mouth daily., Disp: , Rfl:  .  melatonin 3 MG TABS tablet, Take 3 mg by mouth at bedtime., Disp: , Rfl:  .  meloxicam (MOBIC) 15 MG tablet, Take 15 mg by mouth daily., Disp: , Rfl:  .  Multiple Vitamin (MULTIVITAMIN WITH MINERALS) TABS tablet, Take 1 tablet by mouth daily., Disp: , Rfl:  .  omeprazole (PRILOSEC) 20 MG capsule, Take 20 mg by mouth 2 (two) times daily before a meal., Disp: , Rfl:  .  polyethylene glycol (MIRALAX / GLYCOLAX) 17 g packet, Take 17 g by mouth daily as needed., Disp: , Rfl:  .   potassium chloride (KLOR-CON) 10 MEQ tablet, TAKE 1 TABLET BY MOUTH DAILY., Disp: 30 tablet, Rfl: 2 .  Probiotic Product (PROBIOTIC PO), Take 1 capsule by mouth daily. , Disp: , Rfl:  .  simvastatin (ZOCOR) 80 MG tablet, TAKE 1 TABLET BY MOUTH AT BEDTIME.,  Disp: 90 tablet, Rfl: 3 .  VITAMIN E PO, Take by mouth daily. Takes 184mg , Disp: , Rfl:   EXAM:  VITALS per patient if applicable:  GENERAL: alert, oriented, appears well and in no acute distress  HEENT: atraumatic, conjunttiva clear, no obvious abnormalities on inspection of external nose and ears  NECK: normal movements of the head and neck  LUNGS: on inspection no signs of respiratory distress, breathing rate appears normal, no obvious gross SOB, gasping or wheezing  CV: no obvious cyanosis  MS: moves all visible extremities without noticeable abnormality  PSYCH/NEURO: pleasant and cooperative, no obvious depression or anxiety, speech and thought processing grossly intact  ASSESSMENT AND PLAN: Pain management, meds were refilled.  Alysia Penna, MD  Discussed the following assessment and plan:  No diagnosis found.     I discussed the assessment and treatment plan with the patient. The patient was provided an opportunity to ask questions and all were answered. The patient agreed with the plan and demonstrated an understanding of the instructions.   The patient was advised to call back or seek an in-person evaluation if the symptoms worsen or if the condition fails to improve as anticipated.     Review of Systems     Objective:   Physical Exam        Assessment & Plan:

## 2019-11-23 ENCOUNTER — Encounter: Payer: Self-pay | Admitting: Family Medicine

## 2019-11-23 ENCOUNTER — Telehealth (INDEPENDENT_AMBULATORY_CARE_PROVIDER_SITE_OTHER): Payer: PPO | Admitting: Family Medicine

## 2019-11-23 DIAGNOSIS — L237 Allergic contact dermatitis due to plants, except food: Secondary | ICD-10-CM | POA: Diagnosis not present

## 2019-11-23 MED ORDER — METHYLPREDNISOLONE 4 MG PO TBPK: ORAL_TABLET | ORAL | 1 refills | Status: DC

## 2019-11-23 NOTE — Progress Notes (Signed)
Subjective:    Patient ID: [name] [name], female    DOB: Mar 08, 1956, 64 y.o.   MRN: [id number]  HPI Virtual Visit via Video Note  I connected with the patient on 11/23/19 at  3:15 PM EDT by a video enabled telemedicine application and verified that I am speaking with the correct person using two identifiers.  Location patient: home Location provider:work or home office Persons participating in the virtual visit: patient, provider  I discussed the limitations of evaluation and management by telemedicine and the availability of in person appointments. The patient expressed understanding and agreed to proceed.   HPI: Here for an itchy rash over both arms. She was picking up brush in her yard over the weekend.    ROS: See pertinent positives and negatives per HPI.  Past Medical History:  Diagnosis Date  . ADD (attention deficit disorder)   . Allergy    seasonal  . Anxiety   . Arthritis   . Bell's palsy 2008   pt states she had a 'mini stroke' and nervous breakdown which caused facial drooping  . Bipolar 1 disorder (HCC)   . Chronic narcotic use   . CPAP (continuous positive airway pressure) dependence   . Depression    sees the [facility]   . GERD (gastroesophageal reflux disease)   . Hyperlipidemia   . Hypertension    meds no longer needed  . Memory loss    short term  . Meniere disease   . Obesity (BMI 30-39.9) 11/10/2015  . Post concussion syndrome 07/01/2018  . PPD positive 2007 or 2008  . Sleep apnea    on CPAP therapy  . Urinary incontinence   . Vestibular disorder   . Wears dentures   . Wears glasses     Past Surgical History:  Procedure Laterality Date  . ABDOMINAL HYSTERECTOMY    . ANTERIOR FUSION CERVICAL SPINE  2006   Dr. Wynetta Emery  . BREAST BIOPSY Right 1981   benign  . BREAST EXCISIONAL BIOPSY Right 1981   benign   scar does not show  . BREAST SURGERY Bilateral 12-11-12   redu[facility]. Ivin Booty [facility]   . CATARACT  EXTRACTION, BILATERAL Bilateral last summer of 2018   at [facility]  . COLONOSCOPY  04/25/2016   per Dr. Loreta Ave, adenomatous polyps, repeat in 5 yrs   . DRUG INDUCED ENDOSCOPY N/A 01/30/2019   Procedure: DRUG INDUCED ENDOSCOPY;  Surgeon: Christia Reading, MD;  Location: [facility];  Service: ENT;  Laterality: N/A;  . ESOPHAGOGASTRODUODENOSCOPY  04-23-11   per Dr. Loreta Ave, normal   . EYE SURGERY  06/18/2016   L eyelid   . IMPLANTATION OF HYPOGLOSSAL NERVE STIMULATOR Right 03/25/2019   Procedure: IMPLANTATION OF HYPOGLOSSAL NERVE STIMULATOR;  Surgeon: Christia Reading, MD;  Location: [facility] OR;  Service: ENT;  Laterality: Right;  . LUMBAR DISC SURGERY  2003   Dr. Wynetta Emery  . MULTIPLE TOOTH EXTRACTIONS    . ORIF WRIST FRACTURE Right 08/29/2018  . REDUCTION MAMMAPLASTY Bilateral 2013  . SPLIT NIGHT STUDY  11/11/2015  . UVULOPALATOPHARYNGOPLASTY    . VAGINAL HYSTERECTOMY  12/96   secondary to fibroids.    Family History  Problem Relation Age of Onset  . Dementia Mother   . Hyperlipidemia Mother   . Asthma Father   . Hypertension Sister   . Hyperlipidemia Sister   . Coronary artery disease Sister   . Thyroid disease Sister   . Hypertension Brother   .  Hyperlipidemia Brother   . Coronary artery disease Brother   . Hypertension Sister   . Hyperlipidemia Sister   . Alcohol abuse Other        fhx  . Arthritis Other        fhx  . Coronary artery disease Other        fhx  . Depression Other        fhx  . Diabetes Other        fhx  . Hyperlipidemia Other        fhx  . Hypertension Other        fhx  . Sudden death Other        fhx     Current Outpatient Medications:  .  acyclovir (ZOVIRAX) 400 MG tablet, TAKE 1 TABLET BY MOUTH 4 TIMES A DAY AS NEEDED (Patient taking differently: Take 400 mg by mouth 2 (two) times daily. ), Disp: 120 tablet, Rfl: 5 .  alprazolam (XANAX) 2 MG tablet, TAKE 1 TABLET BY MOUTH 3 TIMES A DAY AS NEEDED (Patient taking differently: Take 1 mg by mouth as  needed. ), Disp: 90 tablet, Rfl: 5 .  amphetamine-dextroamphetamine (ADDERALL) 10 MG tablet, Take 1 tablet (10 mg total) by mouth daily with breakfast., Disp: 30 tablet, Rfl: 0 .  azelastine (ASTELIN) 0.1 % nasal spray, PLACE 2 SPRAYS INTO BOTH NOSTRILS 2 TIMES DAILY. USE IN EACH NOSTRIL AS DIRECTED, Disp: 30 mL, Rfl: 11 .  Biotin 5000 MCG TABS, Take 5,000 mcg by mouth daily., Disp: , Rfl:  .  Cholecalciferol (VITAMIN D3) 125 MCG (5000 UT) CAPS, Take 5,000 Units by mouth daily. , Disp: , Rfl:  .  Ferrous Sulfate (IRON) 325 (65 Fe) MG TABS, Take 1 tablet by mouth daily., Disp: , Rfl:  .  FLUoxetine (PROZAC) 10 MG tablet, TAKE 2 TABLETS (20 MG TOTAL) BY MOUTH DAILY., Disp: 60 tablet, Rfl: 11 .  [START ON 01/17/2020] HYDROcodone-acetaminophen (NORCO) 10-325 MG tablet, Take 1 tablet by mouth every 6 (six) hours as needed for moderate pain., Disp: 120 tablet, Rfl: 0 .  hydrOXYzine (ATARAX/VISTARIL) 50 MG tablet, TAKE 1 TABLET BY MOUTH AT BEDTIME. NEED OFFICE VISIT FOR FURTHER REFILLS, Disp: 30 tablet, Rfl: 11 .  Magnesium 500 MG CAPS, Take 1 capsule by mouth daily., Disp: , Rfl:  .  melatonin 3 MG TABS tablet, Take 3 mg by mouth at bedtime., Disp: , Rfl:  .  meloxicam (MOBIC) 15 MG tablet, Take 15 mg by mouth daily., Disp: , Rfl:  .  methylPREDNISolone (MEDROL DOSEPAK) 4 MG TBPK tablet, As directed, Disp: 21 tablet, Rfl: 1 .  Multiple Vitamin (MULTIVITAMIN WITH MINERALS) TABS tablet, Take 1 tablet by mouth daily., Disp: , Rfl:  .  omeprazole (PRILOSEC) 20 MG capsule, Take 20 mg by mouth 2 (two) times daily before a meal., Disp: , Rfl:  .  polyethylene glycol (MIRALAX / GLYCOLAX) 17 g packet, Take 17 g by mouth daily as needed., Disp: , Rfl:  .  potassium chloride (KLOR-CON) 10 MEQ tablet, TAKE 1 TABLET BY MOUTH DAILY., Disp: 30 tablet, Rfl: 2 .  Probiotic Product (PROBIOTIC PO), Take 1 capsule by mouth daily. , Disp: , Rfl:  .  simvastatin (ZOCOR) 80 MG tablet, TAKE 1 TABLET BY MOUTH AT BEDTIME., Disp: 90  tablet, Rfl: 3 .  VITAMIN E PO, Take by mouth daily. Takes 184mg , Disp: , Rfl:   EXAM:  VITALS per patient if applicable:  GENERAL: alert, oriented, appears well and in  no acute distress  HEENT: atraumatic, conjunttiva clear, no obvious abnormalities on inspection of external nose and ears  NECK: normal movements of the head and neck  LUNGS: on inspection no signs of respiratory distress, breathing rate appears normal, no obvious gross SOB, gasping or wheezing  CV: no obvious cyanosis  MS: moves all visible extremities without noticeable abnormality  PSYCH/NEURO: pleasant and cooperative, no obvious depression or anxiety, speech and thought processing grossly intact  ASSESSMENT AND PLAN: Contact dermatitis, treat with a Medrol dose pack.  Gershon Crane, MD  Discussed the following assessment and plan:  No diagnosis found.     I discussed the assessment and treatment plan with the patient. The patient was provided an opportunity to ask questions and all were answered. The patient agreed with the plan and demonstrated an understanding of the instructions.   The patient was advised to call back or seek an in-person evaluation if the symptoms worsen or if the condition fails to improve as anticipated.     Review of Systems     Objective:   Physical Exam        Assessment & Plan:

## 2019-11-23 NOTE — Telephone Encounter (Signed)
She was seen virtually today  ?

## 2019-11-30 ENCOUNTER — Encounter: Payer: Self-pay | Admitting: Family Medicine

## 2019-12-01 NOTE — Telephone Encounter (Signed)
Have her contact the [facility] office at [phone] and she can request to see a female therapist. No referral is needed.

## 2019-12-08 ENCOUNTER — Other Ambulatory Visit: Payer: Self-pay | Admitting: Family Medicine

## 2019-12-08 MED ORDER — AMPHETAMINE-DEXTROAMPHETAMINE 10 MG PO TABS: 10.0000 mg | ORAL_TABLET | Freq: Every day | ORAL | 0 refills | Status: DC

## 2019-12-08 NOTE — Telephone Encounter (Signed)
Last filled 11/06/2019 Last OV 11/23/2019  Ok to fill?

## 2019-12-08 NOTE — Telephone Encounter (Signed)
Done

## 2019-12-09 ENCOUNTER — Telehealth: Payer: Self-pay | Admitting: Family Medicine

## 2019-12-09 NOTE — Telephone Encounter (Signed)
Patient has refills on file at the pharmacy. Left message for patient to call back.

## 2019-12-09 NOTE — Telephone Encounter (Signed)
Pt needs Adderall filled.  Pharmacy- [facility] on [address]

## 2019-12-30 ENCOUNTER — Ambulatory Visit: Payer: PPO | Admitting: Psychologist

## 2020-01-15 ENCOUNTER — Other Ambulatory Visit: Payer: Self-pay | Admitting: Family Medicine

## 2020-01-15 ENCOUNTER — Other Ambulatory Visit: Payer: Self-pay

## 2020-01-15 ENCOUNTER — Ambulatory Visit (HOSPITAL_BASED_OUTPATIENT_CLINIC_OR_DEPARTMENT_OTHER): Payer: PPO

## 2020-01-15 NOTE — Telephone Encounter (Signed)
Last filled 07/29/2019 Last OV 11/23/2019  Ok to fill?

## 2020-01-26 ENCOUNTER — Ambulatory Visit (HOSPITAL_BASED_OUTPATIENT_CLINIC_OR_DEPARTMENT_OTHER): Payer: PPO | Admitting: Cardiology

## 2020-01-26 ENCOUNTER — Other Ambulatory Visit: Payer: Self-pay

## 2020-01-28 NOTE — Procedures (Signed)
errorneous encounter 

## 2020-01-28 NOTE — Progress Notes (Signed)
This encounter was created in error - please disregard.

## 2020-02-05 ENCOUNTER — Other Ambulatory Visit: Payer: Self-pay | Admitting: Family Medicine

## 2020-02-19 ENCOUNTER — Other Ambulatory Visit: Payer: Self-pay | Admitting: Family Medicine

## 2020-02-19 NOTE — Telephone Encounter (Signed)
HYDROcodone-acetaminophen [facility]) 10-325 MG tablet   [address] [place], [place] - [address] Phone:  [phone]  Fax:  [phone]

## 2020-02-19 NOTE — Telephone Encounter (Signed)
Please advise rx is not on the patient medication list.

## 2020-02-19 NOTE — Telephone Encounter (Signed)
Please advise. rx is not on the med list °

## 2020-02-24 ENCOUNTER — Other Ambulatory Visit: Payer: Self-pay | Admitting: Family Medicine

## 2020-02-26 ENCOUNTER — Telehealth (INDEPENDENT_AMBULATORY_CARE_PROVIDER_SITE_OTHER): Payer: PPO | Admitting: Family Medicine

## 2020-02-26 ENCOUNTER — Encounter: Payer: Self-pay | Admitting: Family Medicine

## 2020-02-26 VITALS — Ht 62.0 in | Wt 158.0 lb

## 2020-02-26 DIAGNOSIS — G8929 Other chronic pain: Secondary | ICD-10-CM | POA: Diagnosis not present

## 2020-02-26 DIAGNOSIS — M5440 Lumbago with sciatica, unspecified side: Secondary | ICD-10-CM

## 2020-02-26 DIAGNOSIS — F1190 Opioid use, unspecified, uncomplicated: Secondary | ICD-10-CM

## 2020-02-26 MED ORDER — HYDROCODONE-ACETAMINOPHEN 10-325 MG PO TABS: 1.0000 | ORAL_TABLET | Freq: Four times a day (QID) | ORAL | 0 refills | Status: AC | PRN

## 2020-02-26 MED ORDER — HYDROCODONE-ACETAMINOPHEN 10-325 MG PO TABS: 1.0000 | ORAL_TABLET | Freq: Four times a day (QID) | ORAL | 0 refills | Status: DC | PRN

## 2020-02-26 NOTE — Progress Notes (Signed)
   Subjective:    Patient ID: [name] [name], female    DOB: 28-Jul-1955, 64 y.o.   MRN: [id number]  HPI Here for pain management, she is doing well.  Indication for chronic opioid: low back pain Medication and dose: Norco 10-325 # pills per month: 120 Last UDS date: 05-08-19 Opioid Treatment Agreement signed (Y/N): 09-17-17 Opioid Treatment Agreement last reviewed with patient:  02-26-20 NCCSRS reviewed this encounter (include red flags): Yes    Review of Systems     Objective:   Physical Exam        Assessment & Plan:  Pain management, meds were refilled.  Gershon Crane, MD

## 2020-03-07 ENCOUNTER — Telehealth: Payer: Self-pay | Admitting: Family Medicine

## 2020-03-07 DIAGNOSIS — E785 Hyperlipidemia, unspecified: Secondary | ICD-10-CM

## 2020-03-07 NOTE — Telephone Encounter (Signed)
Pt called to say Dr. Clent Ridges wants her to have a Lipid panel. Pt is scheduled for 09/30 but needs an order for the lab test.   Please advise

## 2020-03-07 NOTE — Addendum Note (Signed)
Addended by: Kathreen Devoid on: 03/07/2020 03:23 PM   Modules accepted: Orders

## 2020-03-07 NOTE — Telephone Encounter (Signed)
Order is in.

## 2020-03-14 DIAGNOSIS — Z9011 Acquired absence of right breast and nipple: Secondary | ICD-10-CM | POA: Diagnosis not present

## 2020-03-14 DIAGNOSIS — Z9012 Acquired absence of left breast and nipple: Secondary | ICD-10-CM | POA: Diagnosis not present

## 2020-03-17 ENCOUNTER — Other Ambulatory Visit: Payer: Self-pay

## 2020-03-17 ENCOUNTER — Other Ambulatory Visit: Payer: PPO

## 2020-03-17 ENCOUNTER — Ambulatory Visit (INDEPENDENT_AMBULATORY_CARE_PROVIDER_SITE_OTHER): Payer: PPO | Admitting: Family Medicine

## 2020-03-17 ENCOUNTER — Encounter: Payer: Self-pay | Admitting: Family Medicine

## 2020-03-17 VITALS — BP 130/90 | HR 76 | Temp 98.1°F | Ht 62.5 in | Wt 159.8 lb

## 2020-03-17 DIAGNOSIS — E538 Deficiency of other specified B group vitamins: Secondary | ICD-10-CM

## 2020-03-17 DIAGNOSIS — E559 Vitamin D deficiency, unspecified: Secondary | ICD-10-CM | POA: Diagnosis not present

## 2020-03-17 DIAGNOSIS — Z23 Encounter for immunization: Secondary | ICD-10-CM | POA: Diagnosis not present

## 2020-03-17 DIAGNOSIS — I10 Essential (primary) hypertension: Secondary | ICD-10-CM | POA: Diagnosis not present

## 2020-03-17 DIAGNOSIS — K219 Gastro-esophageal reflux disease without esophagitis: Secondary | ICD-10-CM | POA: Diagnosis not present

## 2020-03-17 DIAGNOSIS — E785 Hyperlipidemia, unspecified: Secondary | ICD-10-CM

## 2020-03-17 NOTE — Addendum Note (Signed)
Addended by: Wilford Corner on: 03/17/2020 02:06 PM   Modules accepted: Orders

## 2020-03-17 NOTE — Progress Notes (Signed)
   Subjective:    Patient ID: [name] [name], female    DOB: 06/28/1955, 64 y.o.   MRN: [id number]  HPI Here to follow up on hyperlipidemia and other issues. She feels well. She gets exercise and she tries to eat a healthy diet. He BP at home has been stable.    Review of Systems  Constitutional: Negative.   Respiratory: Negative.   Cardiovascular: Negative.   Gastrointestinal: Negative.   Genitourinary: Negative.        Objective:   Physical Exam Constitutional:      Appearance: Normal appearance.  Cardiovascular:     Rate and Rhythm: Normal rate and regular rhythm.     Pulses: Normal pulses.     Heart sounds: Normal heart sounds.  Pulmonary:     Effort: Pulmonary effort is normal.     Breath sounds: Normal breath sounds.  Musculoskeletal:     Right lower leg: No edema.     Left lower leg: No edema.  Neurological:     General: No focal deficit present.     Mental Status: She is alert and oriented to person, place, and time.           Assessment & Plan:  Her GERD and HTN are stable. We wil get fasting labs today including lipids.  Gershon Crane, MD

## 2020-03-18 ENCOUNTER — Other Ambulatory Visit: Payer: Self-pay | Admitting: Family Medicine

## 2020-03-18 DIAGNOSIS — F900 Attention-deficit hyperactivity disorder, predominantly inattentive type: Secondary | ICD-10-CM

## 2020-03-18 LAB — CBC WITH DIFFERENTIAL/PLATELET
Absolute Monocytes: 340 cells/uL (ref 200–950)
Basophils Absolute: 49 cells/uL (ref 0–200)
Basophils Relative: 0.9 %
Eosinophils Absolute: 70 cells/uL (ref 15–500)
Eosinophils Relative: 1.3 %
HCT: 38.3 % (ref 35.0–45.0)
Hemoglobin: 12.8 g/dL (ref 11.7–15.5)
Lymphs Abs: 1463 cells/uL (ref 850–3900)
MCH: 30.8 pg (ref 27.0–33.0)
MCHC: 33.4 g/dL (ref 32.0–36.0)
MCV: 92.3 fL (ref 80.0–100.0)
MPV: 11.7 fL (ref 7.5–12.5)
Monocytes Relative: 6.3 %
Neutro Abs: 3478 cells/uL (ref 1500–7800)
Neutrophils Relative %: 64.4 %
Platelets: 184 10*3/uL (ref 140–400)
RBC: 4.15 10*6/uL (ref 3.80–5.10)
RDW: 12.8 % (ref 11.0–15.0)
Total Lymphocyte: 27.1 %
WBC: 5.4 10*3/uL (ref 3.8–10.8)

## 2020-03-18 LAB — LIPID PANEL
Cholesterol: 202 mg/dL — ABNORMAL HIGH (ref <200)
HDL: 56 mg/dL (ref >=50)
LDL Cholesterol (Calc): 124 mg/dL (calc) — ABNORMAL HIGH
Non-HDL Cholesterol (Calc): 146 mg/dL (calc) — ABNORMAL HIGH (ref <130)
Total CHOL/HDL Ratio: 3.6 (calc) (ref <5.0)
Triglycerides: 109 mg/dL (ref <150)

## 2020-03-18 LAB — HEPATIC FUNCTION PANEL
AG Ratio: 1.7 (calc) (ref 1.0–2.5)
ALT: 14 U/L (ref 6–29)
AST: 27 U/L (ref 10–35)
Albumin: 4.6 g/dL (ref 3.6–5.1)
Alkaline phosphatase (APISO): 66 U/L (ref 37–153)
Bilirubin, Direct: 0.1 mg/dL (ref 0.0–0.2)
Globulin: 2.7 g/dL (calc) (ref 1.9–3.7)
Indirect Bilirubin: 0.4 mg/dL (calc) (ref 0.2–1.2)
Total Bilirubin: 0.5 mg/dL (ref 0.2–1.2)
Total Protein: 7.3 g/dL (ref 6.1–8.1)

## 2020-03-18 LAB — BASIC METABOLIC PANEL
BUN: 13 mg/dL (ref 7–25)
CO2: 31 mmol/L (ref 20–32)
Calcium: 9.8 mg/dL (ref 8.6–10.4)
Chloride: 103 mmol/L (ref 98–110)
Creat: 0.67 mg/dL (ref 0.50–0.99)
Glucose, Bld: 98 mg/dL (ref 65–99)
Potassium: 4.1 mmol/L (ref 3.5–5.3)
Sodium: 141 mmol/L (ref 135–146)

## 2020-03-18 LAB — HEMOGLOBIN A1C
Hgb A1c MFr Bld: 5.5 % of total Hgb (ref <5.7)
Mean Plasma Glucose: 111 (calc)
eAG (mmol/L): 6.2 (calc)

## 2020-03-18 LAB — VITAMIN D 25 HYDROXY (VIT D DEFICIENCY, FRACTURES): Vit D, 25-Hydroxy: 54 ng/mL (ref 30–100)

## 2020-03-18 LAB — TSH: TSH: 0.98 mIU/L (ref 0.40–4.50)

## 2020-03-18 LAB — VITAMIN B12: Vitamin B-12: 702 pg/mL (ref 200–1100)

## 2020-03-18 MED ORDER — AMPHETAMINE-DEXTROAMPHETAMINE 10 MG PO TABS: 10.0000 mg | ORAL_TABLET | Freq: Every day | ORAL | 0 refills | Status: DC

## 2020-03-18 NOTE — Telephone Encounter (Signed)
OV 03/17/20 - CPE Labs 03/17/20 Last filled 02/19/20

## 2020-03-18 NOTE — Telephone Encounter (Signed)
Done

## 2020-03-22 ENCOUNTER — Telehealth: Payer: PPO

## 2020-03-22 ENCOUNTER — Ambulatory Visit: Payer: PPO | Admitting: Pharmacist

## 2020-03-22 DIAGNOSIS — E785 Hyperlipidemia, unspecified: Secondary | ICD-10-CM

## 2020-03-22 DIAGNOSIS — I10 Essential (primary) hypertension: Secondary | ICD-10-CM

## 2020-03-22 NOTE — Chronic Care Management (AMB) (Signed)
Chronic Care Management Pharmacy  Name: [name] [name]  MRN: [id number] DOB: [birth year]  Initial Questions: 1. Have you seen any other providers since your last visit? Yes  2. Any changes in your medicines or health? No   Chief Complaint/ HPI [name] [name],  64 y.o. , female presents for their Follow-Up CCM visit with the clinical pharmacist via telephone due to COVID-19 Pandemic.  PCP : Nelwyn Salisbury, MD  Their chronic conditions include: HTN, HLD, GERD, ADHD, allergic rhinitis, low back pain   Office Visits: 03/17/20 Gershon Crane, MD: Patient presented for annual exam. All labs completed WNL except lipid panel. LDL was 124, decreased from 149 two years ago. No changes made.   02/26/20 Gershon Crane, MD: Patient presented for video visit for pain management. No changes were made. Norco was refilled for 3 month supply.  11/23/19 Gershon Crane, MD: Patient presented for video visit for a rash all over both her arms. Prescribed Medrol dosepak.  Consult Visit: 01/15/20 Patient presented for a sleep study for OSA.   11/06/19 Patient presented to the ED after having a fall in her home from hitting her foot on a vacuum cleaner.  10/06/19 Armanda Magic, MD (cardiology): Patient presented for OSA and HTN follow up for fine tuning of her Inspire device. Patient reports not snoring and not sleeping well.   06/03/2019- Patient presented for office visit with Dr. Armanda Magic, MD for obstructive sleep apnea. Patient reported intolerant to CPAP. She underwent hypoglossal nerve stimulator with Dr. Jenne Pane on 03/25/2019. Inspire device settings modified per patient request. HTN is well controlled. BP: 130/88mmHg. Patient not on any antihypertensive medications. For obesity, patient encouraged a routine exercise program and cut back on carbs and portions.   Medications: Outpatient Encounter Medications as of 03/22/2020  Medication Sig  . acyclovir (ZOVIRAX) 400 MG tablet TAKE 1 TABLET BY MOUTH 4 TIMES  A DAY AS NEEDED (Patient taking differently: Take 400 mg by mouth 2 (two) times daily. )  . alprazolam (XANAX) 2 MG tablet TAKE 1 TABLET BY MOUTH 3 TIMES A DAY AS NEEDED  . [START ON 05/18/2020] amphetamine-dextroamphetamine (ADDERALL) 10 MG tablet Take 1 tablet (10 mg total) by mouth daily with breakfast.  . Ascorbic Acid (VITAMIN C) 1000 MG tablet Take 1,000 mg by mouth daily.  [name] azelastine (ASTELIN) 0.1 % nasal spray PLACE 2 SPRAYS INTO BOTH NOSTRILS 2 TIMES DAILY. USE IN EACH NOSTRIL AS DIRECTED  . b complex vitamins capsule Take 1 capsule by mouth daily.  [name] FLUoxetine (PROZAC) 10 MG tablet TAKE 2 TABLETS (20 MG TOTAL) BY MOUTH DAILY.  [name] Glucosamine-Chondroitin (OSTEO BI-FLEX REGULAR STRENGTH PO) Take 1 tablet by mouth daily. Includes 2,000 units of vit D daily  . [START ON 04/27/2020] HYDROcodone-acetaminophen (NORCO) 10-325 MG tablet Take 1 tablet by mouth every 6 (six) hours as needed for moderate pain.  . Magnesium 500 MG CAPS Take 1 capsule by mouth daily.  . melatonin 3 MG TABS tablet Take 18 mg by mouth at bedtime.   . Multiple Vitamin (MULTIVITAMIN WITH MINERALS) TABS tablet Take 1 tablet by mouth daily.  [name] omeprazole (PRILOSEC) 20 MG capsule Take 20 mg by mouth 2 (two) times daily before a meal.  . polyethylene glycol (MIRALAX / GLYCOLAX) 17 g packet Take 17 g by mouth daily as needed.  . potassium chloride (KLOR-CON) 10 MEQ tablet TAKE 1 TABLET BY MOUTH DAILY.  . Probiotic Product (PROBIOTIC PO) Take 1 capsule by mouth daily.   . simvastatin (  ZOCOR) 80 MG tablet TAKE 1 TABLET BY MOUTH AT BEDTIME.  [name] VITAMIN E PO Take by mouth daily. Takes 184mg   . Cholecalciferol (VITAMIN D3) 125 MCG (5000 UT) CAPS Take 5,000 Units by mouth daily.   . Ferrous Sulfate (IRON) 325 (65 Fe) MG TABS Take 1 tablet by mouth daily.  . hydrOXYzine (ATARAX/VISTARIL) 50 MG tablet TAKE 1 TABLET BY MOUTH AT BEDTIME. NEED OFFICE VISIT FOR FURTHER REFILLS  . methylPREDNISolone (MEDROL DOSEPAK) 4 MG TBPK tablet As  directed (Patient not taking: Reported on 03/23/2020)   No facility-administered encounter medications on file as of 03/22/2020.     Current Diagnosis/Assessment:  Goals Addressed            This Visit's Progress   . Pharmacy Care Plan       CARE PLAN ENTRY Current Barriers:  . Chronic Disease Management support, education, and care coordination needs related to HTN, HLD, and GERD, ADHD, Allergic rhinitis, low back pain   Pharmacist Clinical Goal(s):  05/22/2020 Over next 6 months, patient will work on the following goals:  . Blood pressure:  [name] Maintain Blood pressure <130/80 mmHg  . Maintain low salt diet.  . High cholesterol:  . Cholesterol goals: Total Cholesterol goal under 200, Triglycerides goal under 150, HDL goal above 40 (men) or above 50 (women), LDL goal under 100.  [name] GERD/ PUD . Minimize reflux symptoms.   Interventions: . Comprehensive medication review performed. . Blood pressure:  . Discussed need to continue checking blood pressure at home.  . Discussed diet modifications. DASH diet:  following a diet emphasizing fruits and vegetables and low-fat dairy products along with whole grains, fish, poultry, and nuts. Reducing red meats and sugars.  . Exercising   . Reducing the amount of salt intake to 1500mg /per day.  . Recommend using a salt substitute to replace your salt if you need flavor.    . Getting enough potassium in your diet equaling 3500-5000mg /day.  This helps to regulate BP by balancing out the effects of salt.   . Weight reduction- We discussed losing 5-10% of body weight . Continue: control with diet and exercise.  . High cholesterol . How to reduce cholesterol through diet/weight management and physical activity.    . We discussed how a diet high in plant sterols (fruits/vegetables/nuts/whole grains/legumes) may reduce your cholesterol.  Encouraged increasing fiber to a daily intake of 10-25g/day  . Continue:  simvastatin 80mg , 1 tablet once daily   . GERD: . Discussed non-pharmacological interventions for acid reflux. Take measures to prevent acid reflux, such as avoiding spicy foods, avoiding caffeine, avoid laying down a few hours after eating, and raising the head of the bed. . Continue: omeprazole 20mg , 1 tablet every other day before a meal . Plan to stop medicine in 2 weeks if there is no difference in heartburn symptoms . ADHD . Continue: amphetamine- dextroamphetamine (Adderall) 10mg , 1 tablet once daily with breakfast   . Anxiety/ depression . Continue:  - fluoxetine 10mg , 2 tablets once daily - alprazolam 2mg , 1 tablet three times daily as needed . Low back pain . Continue: Hydrocodone/ acetaminophen (Norco) 10/325mg , 1 tablet every six hours as needed for moderate pain  . Insomnia . Continue follow up appointments with Dr. [name].  . Continue:  - Melatonin 3mg , 1 tablet at bedtime  Patient Self Care Activities:  . Patient verbalizes understanding of plan as described above, Self administers medications as prescribed, Calls pharmacy for medication refills, and Calls provider office for  new concerns or questions . Plan to switch to [facility] for adherence packaging  Please see past updates related to this goal by clicking on the "Past Updates" button in the selected goal        SDOH Interventions     Most Recent Value  SDOH Interventions  Financial Strain Interventions Intervention Not Indicated      Hypertension    Office blood pressures are  BP Readings from Last 3 Encounters:  03/17/20 130/90  11/07/19 (!) 101/53  07/14/19 112/70   Patient has failed these meds in the past: none   Patient checks BP at home: does not check at home   Patient is controlled on:  - no medications  - patient stated stress affects BP  We discussed diet and exercise extensively.  . Reducing the amount of salt intake to 1500mg /per day.  . Recommend using a salt substitute to replace your salt if you need  flavor. . The importance of checking blood pressure at home if feeling symptomatic.       Plan Managed by Dr. Armanda Magic Continue control with diet and exercise   Hyperlipidemia  LDL goal < 100  Lipid Panel     Component Value Date/Time   CHOL 202 (H) 03/17/2020 1152   TRIG 109 03/17/2020 1152   HDL 56 03/17/2020 1152   CHOLHDL 3.6 03/17/2020 1152   VLDL 49.2 (H) 03/10/2018 1035   LDLCALC 124 (H) 03/17/2020 1152   LDLDIRECT 146.0 03/10/2018 1035    The 10-year ASCVD risk score Denman George DC Jr., et al., 2013) is: 9.6%   Values used to calculate the score:     Age: 59 years     Sex: Female     Is Non-Hispanic African American: No     Diabetic: No     Tobacco smoker: Yes     Systolic Blood Pressure: 130 mmHg     Is BP treated: No     HDL Cholesterol: 56 mg/dL     Total Cholesterol: 202 mg/dL   Patient has failed these meds in past: none  Patient is currently uncontrolled on the following medications: - simvastatin 80mg , 1 tablet once daily   We discussed:  diet and exercise extensively  . How to reduce cholesterol through diet/weight management and physical activity.    . We discussed how a diet high in plant sterols (fruits/vegetables/nuts/whole grains/legumes) may reduce your cholesterol.  . Exercise: Patient reports she has been doing yoga lately as exercise  Plan Recommend switching to high intensity statin. Continue current medications   Arthritis pain   Patient is currently on the following medications:  . Osteo Bi-flex (glucosamine condroitin & vitamin D) 2000 units daily  We discussed:  Giving the medicine a few weeks trial before deciding if it is helping; discussed stopping vitamin D supplementation with this as the replacement  Plan  Continue current medications   GERD/ PUD   Patient has failed these meds in past: sucralfate  Patient is currently controlled on the following medications:  - omeprazole 20mg , 1 tablet once daily before a meal   We  discussed:  non-pharmacological interventions for acid reflux. Take measures to prevent acid reflux, such as avoiding spicy foods, avoiding caffeine, avoid laying down a few hours after eating, and raising the head of the bed.  - Patient aware of trigger food and aims to avoid them.  - Discussed the long term risks of using PPIs (osteoporosis, increased infection risk) -Patient expressed interest in  stopping this medicine   Plan Continue current medications.  Plan to take omeprazole every other night for two weeks. If no symptoms, plan to stop completely.  ADHD   Patient has failed these meds in past: Adderall XR (insomnia) Patient is currently controlled on the following medications:  - amphetamine- dextroamphetamine (Adderall) 10mg , 1 tablet once daily with breakfast     - patient does not take every day  Plan Continue current medications.   Allergic rhinitis   Patient has failed these meds in past: none  Patient is currently controlled on the following medications:  - azelastine (Astelin) 0.1% nasal spray, 2 sprays into both nostrils twice daily as directed   Plan Continue current medications.   Anxiety/ depression   Patient has failed these meds in past: Lithium, Seroquel, and others  Patient is currently controlled on the following medications:  - fluoxetine 10mg , 2 tablets once daily - alprazolam 2mg , 1 tablet three times daily as needed (patient reports taking about 2 tablets/day)    We discussed: the long term risks of taking benzodiazepines and possibly needing an increased dose of fluoxetine  Plan Continue current medications  Will discuss tapering at future visit given current circumstances.  Viral prophylaxis   Patient is currently controlled on the following medications:  . Acyclovir 400 mg 1 tablet four times daily - patient only takes once daily     Plan  Will discuss with PCP about switching to valacyclovir to improve adherence. Continue current  medications   Low back pain   Patient is currently controlled on the following medications: meloxicam -Hydrocodone/ acetaminophen (Norco) 10/325mg , 1 tablet every six hours as needed for moderate pain (patient reports usage depends on activities of the day; some days she may take every six hours, other days she might not at all - no more than 2 per day)   Plan Continue current medications.  Insomnia   Patient has failed these meds in past: Seroquel  Patient is currently controlled on the following medications:  - Inspire device  - melatonin 3mg , 6 tablets at bedtime   Plan Continue current medications.   Hypokalemia   Patient is currently controlled on the following medications:  - potassium chloride (Klor-Con) , 1 tablet once daily  Potassium 03/17/2020: 4.1 07/21/2018: 3.3   Plan Continue current medications.   Vitamins/supplements   Patient is currently on the following medications:  Magnesium 500 mg every other day  . Vitamin C 1000 mg 1 tablet daily . Vitamin B complex 1 tablet daily  Plan  Continue current medications   Vaccines   Reviewed and discussed patient's vaccination history.    Immunization History  Administered Date(s) Administered  . Influenza Split 04/26/2011  . Influenza Whole 03/23/2010  . Influenza,inj,Quad PF,6+ Mos 03/20/2013, 03/02/2015, 03/26/2016, 03/25/2017, 05/08/2019, 03/17/2020  . Influenza-Unspecified 05/26/2014, 03/03/2018  . Pneumococcal Conjugate-13 04/19/2016  . Pneumococcal Polysaccharide-23 03/07/2018  . Tdap 04/20/2013  . Zoster 03/26/2016  . Zoster Recombinat (Shingrix) 02/18/2019    Plan  Reassess vaccine history during follow up visit.    Medication Management   Pt uses [facility] pharmacy for all medications Uses pill box? Yes  Pt endorses 80% compliance - patient reports skipping doses sometimes as she gets distracted during making pill boxes and unsure of what she has already taken that day  We  discussed: Discussed benefits of medication synchronization, packaging and delivery as well as enhanced pharmacist oversight with [facility].   Plan  Utilize [facility] for medication synchronization,  packaging and delivery   Follow up: 3 month phone visit  Gaylord ShihMadeline Murtaza Shell, PharmD Clinical Pharmacist [facility] [place] [phone]

## 2020-03-23 ENCOUNTER — Telehealth: Payer: Self-pay

## 2020-03-23 DIAGNOSIS — J309 Allergic rhinitis, unspecified: Secondary | ICD-10-CM

## 2020-03-23 MED ORDER — POTASSIUM CHLORIDE ER 10 MEQ PO TBCR: 10.0000 meq | EXTENDED_RELEASE_TABLET | Freq: Every day | ORAL | 2 refills | Status: DC

## 2020-03-23 MED ORDER — SIMVASTATIN 80 MG PO TABS
80.0000 mg | ORAL_TABLET | Freq: Every day | ORAL | 3 refills | Status: DC
Start: 2020-03-23 — End: 2020-10-28

## 2020-03-23 MED ORDER — FLUOXETINE HCL 10 MG PO TABS: 20.0000 mg | ORAL_TABLET | Freq: Every day | ORAL | 11 refills | Status: DC

## 2020-03-23 MED ORDER — AZELASTINE HCL 0.1 % NA SOLN: NASAL | 11 refills | Status: DC

## 2020-03-23 NOTE — Chronic Care Management (AMB) (Signed)
  Chronic Care Management    Outreach Note  Name: Lexington Devine   MRN: [id number]       DOB:[birth year]   Referred by: Nelwyn Salisbury, MD  Reason for referral: Chronic care management  Reviewed chart for medication changes ahead of medication coordination call.  BP Readings from Last 3 Encounters:  03/17/20 130/90  11/07/19 (!) 101/53  07/14/19 112/70    Lab Results  Component Value Date   HGBA1C 5.5 03/17/2020     Verbal consent obtained for [facility] enhanced pharmacy services (medication synchronization, adherence packaging, delivery coordination). A medication sync plan was created to allow patient to get all medications delivered once every 30 to 90 days per patient preference. Patient understands they have freedom to choose pharmacy and clinical pharmacist will coordinate care between all prescribers and [facility].  Patient requested to obtain medications through Adherence Packaging  30 Days   Med Sync Plan:  Medication Name Last Fill Date & Day Supply Format: MM/DD/YY - DS (If last fill/DS unavailable, list pt.'s quantity on hand) Anticipated next due date  Format: MM/DD/YY      Acyclovir 400 mg - 1 tablet daily #60 tablets 05/22/20  Alprazolam 2 mg PRN PRN Patient will call when needed  Adderall 10 mg PRN Rxs sent to previous pharmacy till December Patient will call when needed  Azelastine 0.1% nasal spray (2 sprays into both nostrils twice daily)  Patient will call when needed  Fluoxetine 10 mg - 2 tablets daily Fill ASAP 03/23/20  Hydrocodone-APAP 10-325 mg PRN Rxs sent to previous pharmacy till December Patient will call when needed  Magnesium 500 mg (every other day) OTC Add when packaging starts  Melatonin 3 mg - 6 tablets at bedtime OTC Add when packaging starts  Multivitamin - 1 tablet daily OTC Add when packaging starts  Miralax PRN PRN Patient will call when needed  Simvastatin 80 mg - 1 tablet daily Multiple bottles of 90  Add when  packaging starts  Vitamin E 184 mg - 1 tablet daily OTC Add when packaging starts  Potassium chloride 10 mEq - 1 tablet daily #60 tablets 05/22/20  Osteo Bi-Flex w/vitamin D (2000 units of vit D) - 1 tablet daily OTC Add when packaging starts  Vitamin B complex - 1 tablet daily OTC Add when packaging starts  Vitamin C 1000 mg - 1 tablet daily OTC Add when packaging starts     Prescriptions requested from PCP and specialists.   Gaylord Shih, PharmD Clinical Pharmacist [facility] at [place] [phone]

## 2020-03-23 NOTE — Telephone Encounter (Signed)
-----   Message from Verner Chol, [facility] sent at 03/23/2020 10:54 AM EDT ----- Regarding: Refills Hello Misty Stanley!  I spoke with [name] yesterday and we are switching her over to [facility]. Could you please send refills of the following medications to [facility]?  -fluoxetine 10 mg -azelastine nasal spray -simvastatin 80 mg -potassium chloride 10 mEq  I updated her preferred pharmacy to [facility]. Please let me know if you need any more information from me!  Best, [name]  Gaylord Shih, PharmD Clinical Pharmacist [facility] [place] [phone]

## 2020-03-23 NOTE — Patient Instructions (Addendum)
Hi [name] [name]!  It was so lovely to get to speak with you over the phone. As we discussed, go ahead and start taking omeprazole every other night. You may have symptoms of heartburn or acid reflux but it should go away after 2 weeks. Once you are done with 2 weeks and you aren't having symptoms, go ahead and stop the medicine all together.  I also attached some helpful information about foods to avoid for heartburn and practicing good sleep hygiene. Please let me know if you have any questions or concerns before our next touch base!  Best, [name]  Gaylord Shih, PharmD Clinical Pharmacist [facility] [place] [phone]   Visit Information  Goals Addressed            This Visit's Progress   . Pharmacy Care Plan       CARE PLAN ENTRY Current Barriers:  . Chronic Disease Management support, education, and care coordination needs related to HTN, HLD, and GERD, ADHD, Allergic rhinitis, low back pain   Pharmacist Clinical Goal(s):  [name] Over next 6 months, patient will work on the following goals:  . Blood pressure:  [name] Maintain Blood pressure <130/80 mmHg  . Maintain low salt diet.  . High cholesterol:  . Cholesterol goals: Total Cholesterol goal under 200, Triglycerides goal under 150, HDL goal above 40 (men) or above 50 (women), LDL goal under 100.  [name] GERD/ PUD . Minimize reflux symptoms.   Interventions: . Comprehensive medication review performed. . Blood pressure:  . Discussed need to continue checking blood pressure at home.  . Discussed diet modifications. DASH diet:  following a diet emphasizing fruits and vegetables and low-fat dairy products along with whole grains, fish, poultry, and nuts. Reducing red meats and sugars.  . Exercising   . Reducing the amount of salt intake to 1500mg /per day.  . Recommend using a salt substitute to replace your salt if you need flavor.    . Getting enough potassium in your diet equaling 3500-5000mg /day.  This helps to regulate  BP by balancing out the effects of salt.   . Weight reduction- We discussed losing 5-10% of body weight . Continue: control with diet and exercise.  . High cholesterol . How to reduce cholesterol through diet/weight management and physical activity.    . We discussed how a diet high in plant sterols (fruits/vegetables/nuts/whole grains/legumes) may reduce your cholesterol.  Encouraged increasing fiber to a daily intake of 10-25g/day  . Continue:  simvastatin 80mg , 1 tablet once daily  . GERD: . Discussed non-pharmacological interventions for acid reflux. Take measures to prevent acid reflux, such as avoiding spicy foods, avoiding caffeine, avoid laying down a few hours after eating, and raising the head of the bed. . Continue: omeprazole 20mg , 1 tablet every other day before a meal . Plan to stop medicine in 2 weeks if there is no difference in heartburn symptoms . ADHD . Continue: amphetamine- dextroamphetamine (Adderall) 10mg , 1 tablet once daily with breakfast   . Anxiety/ depression . Continue:  - fluoxetine 10mg , 2 tablets once daily - alprazolam 2mg , 1 tablet three times daily as needed . Low back pain . Continue: Hydrocodone/ acetaminophen (Norco) 10/325mg , 1 tablet every six hours as needed for moderate pain  . Insomnia . Continue follow up appointments with Dr. .  . Continue:  - Melatonin 3mg , 1 tablet at bedtime  Patient Self Care Activities:  . Patient verbalizes understanding of plan as described above, Self administers medications as prescribed, Calls pharmacy for  medication refills, and Calls provider office for new concerns or questions . Plan to switch to [facility] for adherence packaging  Please see past updates related to this goal by clicking on the "Past Updates" button in the selected goal         The patient verbalized understanding of instructions provided today and declined a print copy of patient instruction materials.   Telephone follow up  appointment with pharmacy team member scheduled for: 3 months  Gaylord ShihMadeline Sharina Petre, PharmD Clinical Pharmacist [facility] [place] [phone]    Food Choices for Gastroesophageal Reflux Disease, Adult When you have gastroesophageal reflux disease (GERD), the foods you eat and your eating habits are very important. Choosing the right foods can help ease your discomfort. Think about working with a nutrition specialist (dietitian) to help you make good choices. What are tips for following this plan?  Meals  Choose healthy foods that are low in fat, such as fruits, vegetables, whole grains, low-fat dairy products, and lean meat, fish, and poultry.  Eat small meals often instead of 3 large meals a day. Eat your meals slowly, and in a place where you are relaxed. Avoid bending over or lying down until 2-3 hours after eating.  Avoid eating meals 2-3 hours before bed.  Avoid drinking a lot of liquid with meals.  Cook foods using methods other than frying. Bake, grill, or broil food instead.  Avoid or limit: ? Chocolate. ? Peppermint or spearmint. ? Alcohol. ? Pepper. ? Black and decaffeinated coffee. ? Black and decaffeinated tea. ? Bubbly (carbonated) soft drinks. ? Caffeinated energy drinks and soft drinks.  Limit high-fat foods such as: ? Fatty meat or fried foods. ? Whole milk, cream, butter, or ice cream. ? Nuts and nut butters. ? Pastries, donuts, and sweets made with butter or shortening.  Avoid foods that cause symptoms. These foods may be different for everyone. Common foods that cause symptoms include: ? Tomatoes. ? Oranges, lemons, and limes. ? Peppers. ? Spicy food. ? Onions and garlic. ? Vinegar. Lifestyle  Maintain a healthy weight. Ask your doctor what weight is healthy for you. If you need to lose weight, work with your doctor to do so safely.  Exercise for at least 30 minutes for 5 or more days each week, or as told by your doctor.  Wear  loose-fitting clothes.  Do not smoke. If you need help quitting, ask your doctor.  Sleep with the head of your bed higher than your feet. Use a wedge under the mattress or blocks under the bed frame to raise the head of the bed. Summary  When you have gastroesophageal reflux disease (GERD), food and lifestyle choices are very important in easing your symptoms.  Eat small meals often instead of 3 large meals a day. Eat your meals slowly, and in a place where you are relaxed.  Limit high-fat foods such as fatty meat or fried foods.  Avoid bending over or lying down until 2-3 hours after eating.  Avoid peppermint and spearmint, caffeine, alcohol, and chocolate. This information is not intended to replace advice given to you by your health care provider. Make sure you discuss any questions you have with your health care provider. Document Revised: 09/25/2018 Document Reviewed: 07/10/2016 Elsevier Patient Education  2020 Elsevier Inc.  Insomnia Insomnia is a sleep disorder that makes it difficult to fall asleep or stay asleep. Insomnia can cause fatigue, low energy, difficulty concentrating, mood swings, and poor performance at work or school. There  are three different ways to classify insomnia:  Difficulty falling asleep.  Difficulty staying asleep.  Waking up too early in the morning. Any type of insomnia can be long-term (chronic) or short-term (acute). Both are common. Short-term insomnia usually lasts for three months or less. Chronic insomnia occurs at least three times a week for longer than three months. What are the causes? Insomnia may be caused by another condition, situation, or substance, such as:  Anxiety.  Certain medicines.  Gastroesophageal reflux disease (GERD) or other gastrointestinal conditions.  Asthma or other breathing conditions.  Restless legs syndrome, sleep apnea, or other sleep disorders.  Chronic pain.  Menopause.  Stroke.  Abuse of alcohol,  tobacco, or illegal drugs.  Mental health conditions, such as depression.  Caffeine.  Neurological disorders, such as Alzheimer's disease.  An overactive thyroid (hyperthyroidism). Sometimes, the cause of insomnia may not be known. What increases the risk? Risk factors for insomnia include:  Gender. Women are affected more often than men.  Age. Insomnia is more common as you get older.  Stress.  Lack of exercise.  Irregular work schedule or working night shifts.  Traveling between different time zones.  Certain medical and mental health conditions. What are the signs or symptoms? If you have insomnia, the main symptom is having trouble falling asleep or having trouble staying asleep. This may lead to other symptoms, such as:  Feeling fatigued or having low energy.  Feeling nervous about going to sleep.  Not feeling rested in the morning.  Having trouble concentrating.  Feeling irritable, anxious, or depressed. How is this diagnosed? This condition may be diagnosed based on:  Your symptoms and medical history. Your health care provider may ask about: ? Your sleep habits. ? Any medical conditions you have. ? Your mental health.  A physical exam. How is this treated? Treatment for insomnia depends on the cause. Treatment may focus on treating an underlying condition that is causing insomnia. Treatment may also include:  Medicines to help you sleep.  Counseling or therapy.  Lifestyle adjustments to help you sleep better. Follow these instructions at home: Eating and drinking   Limit or avoid alcohol, caffeinated beverages, and cigarettes, especially close to bedtime. These can disrupt your sleep.  Do not eat a large meal or eat spicy foods right before bedtime. This can lead to digestive discomfort that can make it hard for you to sleep. Sleep habits   Keep a sleep diary to help you and your health care provider figure out what could be causing your  insomnia. Write down: ? When you sleep. ? When you wake up during the night. ? How well you sleep. ? How rested you feel the next day. ? Any side effects of medicines you are taking. ? What you eat and drink.  Make your bedroom a dark, comfortable place where it is easy to fall asleep. ? Put up shades or blackout curtains to block light from outside. ? Use a white noise machine to block noise. ? Keep the temperature cool.  Limit screen use before bedtime. This includes: ? Watching TV. ? Using your smartphone, tablet, or computer.  Stick to a routine that includes going to bed and waking up at the same times every day and night. This can help you fall asleep faster. Consider making a quiet activity, such as reading, part of your nighttime routine.  Try to avoid taking naps during the day so that you sleep better at night.  Get out of bed  if you are still awake after 15 minutes of trying to sleep. Keep the lights down, but try reading or doing a quiet activity. When you feel sleepy, go back to bed. General instructions  Take over-the-counter and prescription medicines only as told by your health care provider.  Exercise regularly, as told by your health care provider. Avoid exercise starting several hours before bedtime.  Use relaxation techniques to manage stress. Ask your health care provider to suggest some techniques that may work well for you. These may include: ? Breathing exercises. ? Routines to release muscle tension. ? Visualizing peaceful scenes.  Make sure that you drive carefully. Avoid driving if you feel very sleepy.  Keep all follow-up visits as told by your health care provider. This is important. Contact a health care provider if:  You are tired throughout the day.  You have trouble in your daily routine due to sleepiness.  You continue to have sleep problems, or your sleep problems get worse. Get help right away if:  You have serious thoughts about  hurting yourself or someone else. If you ever feel like you may hurt yourself or others, or have thoughts about taking your own life, get help right away. You can go to your nearest emergency department or call:  Your local emergency services (911 in the [place]  A suicide crisis helpline, such as the National Suicide Prevention Lifeline at 478 145 9968. This is open 24 hours a day. Summary  Insomnia is a sleep disorder that makes it difficult to fall asleep or stay asleep.  Insomnia can be long-term (chronic) or short-term (acute).  Treatment for insomnia depends on the cause. Treatment may focus on treating an underlying condition that is causing insomnia.  Keep a sleep diary to help you and your health care provider figure out what could be causing your insomnia. This information is not intended to replace advice given to you by your health care provider. Make sure you discuss any questions you have with your health care provider. Document Revised: 05/17/2017 Document Reviewed: 03/14/2017 Elsevier Patient Education  2020 [employer].

## 2020-03-24 ENCOUNTER — Telehealth: Payer: Self-pay | Admitting: Family Medicine

## 2020-03-24 MED ORDER — VALACYCLOVIR HCL 500 MG PO TABS: 500.0000 mg | ORAL_TABLET | Freq: Every day | ORAL | 3 refills | Status: DC

## 2020-03-24 NOTE — Telephone Encounter (Signed)
Done

## 2020-03-25 ENCOUNTER — Encounter (HOSPITAL_BASED_OUTPATIENT_CLINIC_OR_DEPARTMENT_OTHER): Payer: PPO | Admitting: Cardiology

## 2020-03-28 ENCOUNTER — Encounter: Payer: PPO | Admitting: Family Medicine

## 2020-03-29 ENCOUNTER — Telehealth: Payer: Self-pay | Admitting: Family Medicine

## 2020-03-29 MED ORDER — FLUOXETINE HCL 10 MG PO CAPS: 10.0000 mg | ORAL_CAPSULE | Freq: Every day | ORAL | 3 refills | Status: DC

## 2020-03-29 MED ORDER — ACYCLOVIR 200 MG PO CAPS: 400.0000 mg | ORAL_CAPSULE | Freq: Every day | ORAL | 3 refills | Status: DC

## 2020-03-29 NOTE — Telephone Encounter (Signed)
-----   Message from Verner Chol, [facility] sent at 03/29/2020 10:28 AM EDT ----- Regarding: Meds follow up Hi,  As we discussed yesterday, can you please send a refill of fluoxetine as capsules instead tablets to [facility]?   Also, she cannot afford to switch to valacyclovir so could you also send a new prescription for acyclovir to [facility] as well? She was taking it once daily before.  Thank you, [name]

## 2020-03-29 NOTE — Telephone Encounter (Signed)
Done

## 2020-04-04 ENCOUNTER — Encounter: Payer: Self-pay | Admitting: Family Medicine

## 2020-04-04 DIAGNOSIS — H40033 Anatomical narrow angle, bilateral: Secondary | ICD-10-CM | POA: Diagnosis not present

## 2020-04-04 DIAGNOSIS — H43393 Other vitreous opacities, bilateral: Secondary | ICD-10-CM | POA: Diagnosis not present

## 2020-04-05 ENCOUNTER — Ambulatory Visit: Payer: PPO | Admitting: Family Medicine

## 2020-04-05 NOTE — Telephone Encounter (Signed)
Appointment changed to 04/29/20 at 1 pm

## 2020-04-05 NOTE — Telephone Encounter (Signed)
I understand. Please cancel her appt for today and do not charge any fees

## 2020-04-12 ENCOUNTER — Other Ambulatory Visit (INDEPENDENT_AMBULATORY_CARE_PROVIDER_SITE_OTHER): Payer: PPO

## 2020-04-12 ENCOUNTER — Other Ambulatory Visit: Payer: Self-pay

## 2020-04-12 ENCOUNTER — Other Ambulatory Visit: Payer: Self-pay | Admitting: Family Medicine

## 2020-04-12 DIAGNOSIS — Z79899 Other long term (current) drug therapy: Secondary | ICD-10-CM

## 2020-04-12 DIAGNOSIS — F1190 Opioid use, unspecified, uncomplicated: Secondary | ICD-10-CM

## 2020-04-12 DIAGNOSIS — F900 Attention-deficit hyperactivity disorder, predominantly inattentive type: Secondary | ICD-10-CM | POA: Diagnosis not present

## 2020-04-12 NOTE — Progress Notes (Signed)
Per PCP pt due for UDS/pt has lab visit scheduled for today/future order created/thx dmf

## 2020-04-13 ENCOUNTER — Encounter: Payer: Self-pay | Admitting: Family Medicine

## 2020-04-13 NOTE — Telephone Encounter (Signed)
Noted  

## 2020-04-15 ENCOUNTER — Other Ambulatory Visit: Payer: Self-pay | Admitting: Family Medicine

## 2020-04-15 NOTE — Telephone Encounter (Signed)
Last OV: 03/17/20 Last refill: 05/13/19 #30/11 refills

## 2020-04-16 LAB — DRUG MONITOR, PANEL 1, W/CONF, URINE
Alphahydroxyalprazolam: 180 ng/mL — ABNORMAL HIGH (ref <25)
Alphahydroxymidazolam: NEGATIVE ng/mL (ref <50)
Alphahydroxytriazolam: NEGATIVE ng/mL (ref <50)
Aminoclonazepam: NEGATIVE ng/mL (ref <25)
Amphetamine: 2377 ng/mL — ABNORMAL HIGH (ref <250)
Amphetamines: POSITIVE ng/mL — AB (ref <500)
Barbiturates: NEGATIVE ng/mL (ref <300)
Benzodiazepines: POSITIVE ng/mL — AB (ref <100)
Cocaine Metabolite: NEGATIVE ng/mL (ref <150)
Codeine: NEGATIVE ng/mL (ref <50)
Creatinine: 17 mg/dL
Hydrocodone: 866 ng/mL — ABNORMAL HIGH (ref <50)
Hydromorphone: 56 ng/mL — ABNORMAL HIGH (ref <50)
Hydroxyethylflurazepam: NEGATIVE ng/mL (ref <50)
Lorazepam: NEGATIVE ng/mL (ref <50)
Marijuana Metabolite: NEGATIVE ng/mL (ref <20)
Methadone Metabolite: NEGATIVE ng/mL (ref <100)
Methamphetamine: NEGATIVE ng/mL (ref <250)
Morphine: NEGATIVE ng/mL (ref <50)
Nordiazepam: NEGATIVE ng/mL (ref <50)
Norhydrocodone: 1332 ng/mL — ABNORMAL HIGH (ref <50)
Opiates: POSITIVE ng/mL — AB (ref <100)
Oxazepam: NEGATIVE ng/mL (ref <50)
Oxidant: NEGATIVE ug/mL
Oxycodone: NEGATIVE ng/mL (ref <100)
Phencyclidine: NEGATIVE ng/mL (ref <25)
Specific Gravity: 1.002 — ABNORMAL LOW (ref >=1.0)
Temazepam: NEGATIVE ng/mL (ref <50)
pH: 6.6 (ref 4.5–9.0)

## 2020-04-16 LAB — DM TEMPLATE

## 2020-04-18 ENCOUNTER — Encounter: Payer: Self-pay | Admitting: Family Medicine

## 2020-04-18 NOTE — Telephone Encounter (Signed)
The easiest place to get tested for the Covid virus is at a pharmacy or at a [facility]

## 2020-04-25 ENCOUNTER — Other Ambulatory Visit: Payer: Self-pay | Admitting: Family Medicine

## 2020-04-25 DIAGNOSIS — J309 Allergic rhinitis, unspecified: Secondary | ICD-10-CM

## 2020-04-28 ENCOUNTER — Other Ambulatory Visit: Payer: Self-pay | Admitting: Family Medicine

## 2020-04-29 ENCOUNTER — Other Ambulatory Visit: Payer: Self-pay

## 2020-04-29 ENCOUNTER — Ambulatory Visit (HOSPITAL_BASED_OUTPATIENT_CLINIC_OR_DEPARTMENT_OTHER): Payer: PPO | Attending: Cardiology | Admitting: Cardiology

## 2020-04-29 DIAGNOSIS — Z79899 Other long term (current) drug therapy: Secondary | ICD-10-CM | POA: Diagnosis not present

## 2020-04-29 DIAGNOSIS — R0902 Hypoxemia: Secondary | ICD-10-CM | POA: Insufficient documentation

## 2020-04-29 DIAGNOSIS — G4733 Obstructive sleep apnea (adult) (pediatric): Secondary | ICD-10-CM | POA: Diagnosis not present

## 2020-04-29 DIAGNOSIS — R0683 Snoring: Secondary | ICD-10-CM | POA: Insufficient documentation

## 2020-05-04 NOTE — Procedures (Signed)
   Patient Name: [name] [name], Bedoya Date: 04/30/2020 Gender: Female D.O.B: 1956-03-05 Age (years): 71 Referring Provider: Armanda Magic MD, ABSM Height (inches): 62 Interpreting Physician: Armanda Magic MD, ABSM Weight (lbs): 162 RPSGT: Bacon Sink BMI: 30 MRN: [id number] Neck Size: 15.00  CLINICAL INFORMATION Sleep Study Type: HST  Indication for sleep study: OSA s/p Hypoglossal nerve stimulator  Epworth Sleepiness Score: 3  Most recent polysomnogram dated 11/11/2015 revealed an AHI of 25.5/h and RDI of 25.5/h. Most recent titration study dated 08/25/2019 revealed an AHI of 22.0/h.  SLEEP STUDY TECHNIQUE A multi-channel overnight portable sleep study was performed. The channels recorded were: nasal airflow, thoracic respiratory movement, and oxygen saturation with a pulse oximetry. Snoring was also monitored.  MEDICATIONS Patient self administered medications include: OMEPRAZOLE, SIMVASTATIN, MELATONIN, HYDROXYZINE, VITAMIN E.  SLEEP ARCHITECTURE Patient was studied for 656 minutes. The sleep efficiency was 100.0 % and the patient was supine for 43.3%. The arousal index was 0.0 per hour.  RESPIRATORY PARAMETERS The overall AHI was 2.0 per hour, with a central apnea index of 0.0 per hour.  The oxygen nadir was 86% during sleep.  CARDIAC DATA Mean heart rate during sleep was 54.4 bpm.  IMPRESSIONS - No significant obstructive sleep apnea occurred during this study (AHI = 2.0/h). - No significant central sleep apnea occurred during this study (CAI = 0.0/h). - Moderate oxygen desaturation was noted during this study (Min O2 = 86%). - Patient snored 1.0% during the sleep.  DIAGNOSIS - Nocturnal Hypoxemia (G47.36)  RECOMMENDATIONS - Recommend overnight pulse oximetry to make sure that nocturnal hypoxemia on HST was accurate - Avoid alcohol, sedatives and other CNS depressants that may worsen sleep apnea and disrupt normal sleep architecture. - Sleep hygiene should  be reviewed to assess factors that may improve sleep quality. - Weight management and regular exercise should be initiated or continued.  [Electronically signed] 05/04/2020 10:13 AM  Armanda Magic MD, ABSM Diplomate, American Board of Sleep Medicine

## 2020-05-05 ENCOUNTER — Telehealth: Payer: Self-pay | Admitting: *Deleted

## 2020-05-05 DIAGNOSIS — G4733 Obstructive sleep apnea (adult) (pediatric): Secondary | ICD-10-CM

## 2020-05-05 NOTE — Telephone Encounter (Signed)
Over night pulse ox ordered with [facility]

## 2020-05-05 NOTE — Telephone Encounter (Signed)
-----   Message from [name], MD sent at 05/04/2020 10:19 AM EST ----- No apneas on home sleep study but still with nocturnal hypoxemia.  Please get an overnight pulse ox to make sure her HST oximeter was accurate

## 2020-05-10 ENCOUNTER — Telehealth: Payer: Self-pay | Admitting: *Deleted

## 2020-05-10 DIAGNOSIS — G4733 Obstructive sleep apnea (adult) (pediatric): Secondary | ICD-10-CM

## 2020-05-10 NOTE — Telephone Encounter (Signed)
Order placed to [facility] for a overnight pulse oximeter. Patient notified and agrees.

## 2020-05-10 NOTE — Telephone Encounter (Signed)
-----   Message from Traci R Turner, MD sent at 05/04/2020 10:19 AM EST ----- No apneas on home sleep study but still with nocturnal hypoxemia.  Please get an overnight pulse ox to make sure her HST oximeter was accurate 

## 2020-05-18 ENCOUNTER — Telehealth: Payer: Self-pay | Admitting: Pharmacist

## 2020-05-18 NOTE — Chronic Care Management (AMB) (Signed)
Chronic Care Management Pharmacy Assistant   Name: [name] [name]  MRN: [id number] DOB: 1956-02-13  Reason for Encounter: Medication Review  Patient Questions:  1.  Have you seen any other providers since your last visit? No  2.  Any changes in your medicines or health? No   PCP : Nelwyn Salisbury, MD  Allergies:   Allergies  Allergen Reactions  . Sulfonamide Derivatives Swelling    Facial swelling  . Flexeril [Cyclobenzaprine] Other (See Comments)    Dizziness, made meniere's disease worse   . Hydromorphone Itching  . Morphine And Related Nausea And Vomiting  . Sulfamethoxazole Other (See Comments)    Causes swelling     Medications: Outpatient Encounter Medications as of 05/18/2020  Medication Sig  . acyclovir (ZOVIRAX) 200 MG capsule Take 2 capsules (400 mg total) by mouth daily in the afternoon.  [name] alprazolam (XANAX) 2 MG tablet TAKE 1 TABLET BY MOUTH 3 TIMES A DAY AS NEEDED  . amphetamine-dextroamphetamine (ADDERALL) 10 MG tablet Take 1 tablet (10 mg total) by mouth daily with breakfast.  . Ascorbic Acid (VITAMIN C) 1000 MG tablet Take 1,000 mg by mouth daily.  [name] azelastine (ASTELIN) 0.1 % nasal spray PLACE 2 SPRAYS INTO BOTH NOSTRILS 2 TIMES DAILY AS DIRECTED  . b complex vitamins capsule Take 1 capsule by mouth daily.  . Cholecalciferol (VITAMIN D3) 125 MCG (5000 UT) CAPS Take 5,000 Units by mouth daily.   . Ferrous Sulfate (IRON) 325 (65 Fe) MG TABS Take 1 tablet by mouth daily.  [name] FLUoxetine (PROZAC) 10 MG capsule Take 1 capsule (10 mg total) by mouth daily.  . Glucosamine-Chondroitin (OSTEO BI-FLEX REGULAR STRENGTH PO) Take 1 tablet by mouth daily. Includes 2,000 units of vit D daily  . HYDROcodone-acetaminophen (NORCO) 10-325 MG tablet Take 1 tablet by mouth every 6 (six) hours as needed for moderate pain.  . hydrOXYzine (ATARAX/VISTARIL) 50 MG tablet TAKE 1 TABLET BY MOUTH AT BEDTIME.  . Magnesium 500 MG CAPS Take 1 capsule by mouth daily.  . melatonin 3  MG TABS tablet Take 18 mg by mouth at bedtime.   . methylPREDNISolone (MEDROL DOSEPAK) 4 MG TBPK tablet As directed (Patient not taking: Reported on 03/23/2020)  . Multiple Vitamin (MULTIVITAMIN WITH MINERALS) TABS tablet Take 1 tablet by mouth daily.  [name] omeprazole (PRILOSEC) 20 MG capsule Take 20 mg by mouth 2 (two) times daily before a meal.  . polyethylene glycol (MIRALAX / GLYCOLAX) 17 g packet Take 17 g by mouth daily as needed.  . potassium chloride (KLOR-CON) 10 MEQ tablet Take 1 tablet (10 mEq total) by mouth daily.  . Probiotic Product (PROBIOTIC PO) Take 1 capsule by mouth daily.   . simvastatin (ZOCOR) 80 MG tablet Take 1 tablet (80 mg total) by mouth at bedtime.  [name] VITAMIN E PO Take by mouth daily. Takes 184mg    No facility-administered encounter medications on file as of 05/18/2020.    Current Diagnosis: Patient Active Problem List   Diagnosis Date Noted  . Hypokalemia 07/23/2018  . Post concussion syndrome 07/01/2018  . Neck pain 05/06/2017  . Angina pectoris (HCC) 06/06/2016  . PUD (peptic ulcer disease) 06/06/2016  . Attention deficit hyperactivity disorder (ADHD) 02/07/2016  . Obesity (BMI 30-39.9) 11/10/2015  . Balance disorder 03/02/2015  . Bipolar disorder (HCC) 12/13/2009  . MEMORY LOSS 07/01/2009  . BACK PAIN, LUMBAR 01/21/2009  . EDEMA LEG 01/21/2009  . HIDRADENITIS SUPPURATIVA 10/04/2008  . DIZZINESS 04/16/2008  . 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  Anxiety 01/23/2008  . DEPRESSION 01/23/2008  . Essential hypertension 01/23/2008  . Allergic rhinitis 01/23/2008  . GERD 01/23/2008  . INSOMNIA 01/23/2008  . URINARY INCONTINENCE 01/23/2008  . POSITIVE PPD 01/23/2008  . Hyperlipidemia 04/15/2007  . Sleep apnea 04/15/2007  . Headache 04/15/2007    Goals Addressed   None    Reviewed chart for medication changes ahead of medication coordination call. No OVs, Consults, or hospital visits since last care coordination call/Pharmacist visit. No medication changes  indicated  BP Readings from Last 3 Encounters:  03/17/20 130/90  11/07/19 (!) 101/53  07/14/19 112/70    Lab Results  Component Value Date   HGBA1C 5.5 03/17/2020     Patient obtains medications through Adherence Packaging  30 Days   Last adherence delivery included:  [name] Acyclovir (ZOVIRAX) 200 mg: two tablets at dinner . Potassium chloride (KLOR-CON) 10 MEQ: one tablet at breakfast . Simvastatin (ZOCOR) 80 mg: one tablet at bedtime . Fluoxetine (PROZAC) 10 mg: one capsule at breakfast . Azelastine (ASTELIN) 0.1 % nasal spray: two sprays in each nostril twice a day as needed  Patient is due for next adherence delivery on: 05-23-2020. Called patient and reviewed medications and coordinated delivery.  This delivery to include: [name] Acyclovir (ZOVIRAX) 200 mg: two tablets at dinner . Potassium chloride (KLOR-CON) 10 MEQ: one tablet at breakfast . Simvastatin (ZOCOR) 80 mg: one tablet at bedtime  Patient declined the following medications due to PRN and supply on hand: . Fluoxetine (PROZAC) 10 mg: one capsule at breakfast . Melatonin 10 mg: two tablets at bedtime . Azelastine (ASTELIN) 0.1 % nasal spray: two sprays in each nostril twice a day as needed  She currently does not need refills. Confirmed delivery date of 12-06-201, advised patient that pharmacy will contact them the morning of delivery.  Follow-Up:  Coordination of Enhanced Pharmacy Services   Amilia (Mimi) Bascom Levels, [place] Clinical Pharmacy Assistant [phone]

## 2020-05-19 NOTE — Addendum Note (Signed)
Addended by: Reesa Chew on: 05/19/2020 12:07 PM   Modules accepted: Orders

## 2020-05-25 ENCOUNTER — Telehealth: Payer: Self-pay | Admitting: Family Medicine

## 2020-05-25 NOTE — Telephone Encounter (Signed)
Pt is calling in stating that she would like to see if she could get her Rx hydrocodone-acetaminophen (NORCO) 10-325 MG earlier and her pharmacy will be closed on that Sunday 12/12/2021when it is due Rx amphetamine-dextroamphetamine (ADDERALL) 10 MG  Pharm:  [facility] on [address].

## 2020-05-28 ENCOUNTER — Other Ambulatory Visit: Payer: Self-pay | Admitting: Family Medicine

## 2020-05-30 NOTE — Telephone Encounter (Signed)
Rx's can be filled today.

## 2020-06-02 ENCOUNTER — Other Ambulatory Visit: Payer: Self-pay | Admitting: *Deleted

## 2020-06-03 MED ORDER — HYDROCODONE-ACETAMINOPHEN 10-325 MG PO TABS: 1.0000 | ORAL_TABLET | Freq: Four times a day (QID) | ORAL | 0 refills | Status: DC | PRN

## 2020-06-03 NOTE — Telephone Encounter (Signed)
Patient is scheduled for a tlephone visit on 12/23 with Dr. Clent Ridges for a PMV. She is out of her HYDROcodone-acetaminophen (NORCO) 10-325 MG tablet   She's been trying to get this filled for 2 weeks now and she wants to know what is wrong and wants to know if there's something that we're not telling her.  She would like a short supply of her medication filled today until 12/23

## 2020-06-03 NOTE — Telephone Encounter (Signed)
Can you authorize a short term fill until her appt on 12/23? We were never told that pt needed a new prescription, just that her pharmacy would be closed the day she was due for refill.

## 2020-06-03 NOTE — Telephone Encounter (Signed)
I will authorize 15 tabs. Can you route to me? Thanks

## 2020-06-03 NOTE — Telephone Encounter (Signed)
I don't see this on her med list. When was it last filled by Dr. Clent Ridges??

## 2020-06-03 NOTE — Addendum Note (Signed)
Addended by: Kathreen Devoid on: 06/03/2020 09:14 AM   Modules accepted: Orders

## 2020-06-03 NOTE — Telephone Encounter (Signed)
It was last filled 04/27/20 - he has it set to expire off the med list once the 30 days is up & then he renews the Rx.

## 2020-06-03 NOTE — Telephone Encounter (Signed)
Spoke with pt. She is aware Rx was sent in, her appt has been moved up to 12/22 at 1:30 so Rx can be filled before she runs out of the short term supply. Pt was very appreciative and thanked me for the call.

## 2020-06-08 ENCOUNTER — Telehealth (INDEPENDENT_AMBULATORY_CARE_PROVIDER_SITE_OTHER): Payer: PPO | Admitting: Family Medicine

## 2020-06-08 ENCOUNTER — Encounter: Payer: Self-pay | Admitting: Family Medicine

## 2020-06-08 VITALS — Ht 62.52 in | Wt 157.0 lb

## 2020-06-08 DIAGNOSIS — F1190 Opioid use, unspecified, uncomplicated: Secondary | ICD-10-CM

## 2020-06-08 DIAGNOSIS — M5440 Lumbago with sciatica, unspecified side: Secondary | ICD-10-CM

## 2020-06-08 DIAGNOSIS — G8929 Other chronic pain: Secondary | ICD-10-CM

## 2020-06-08 MED ORDER — HYDROCODONE-ACETAMINOPHEN 10-325 MG PO TABS: 1.0000 | ORAL_TABLET | Freq: Four times a day (QID) | ORAL | 0 refills | Status: DC | PRN

## 2020-06-08 MED ORDER — HYDROCODONE-ACETAMINOPHEN 10-325 MG PO TABS: 1.0000 | ORAL_TABLET | Freq: Four times a day (QID) | ORAL | 0 refills | Status: AC | PRN

## 2020-06-08 NOTE — Progress Notes (Signed)
   Subjective:    Patient ID: [name] [name], female    DOB: [birth year], 64 y.o.   MRN: [id number]  HPI Virtual Visit via Telephone Note  I connected with the patient on 06/08/20 at  1:30 PM EST by telephone and verified that I am speaking with the correct person using two identifiers.   I discussed the limitations, risks, security and privacy concerns of performing an evaluation and management service by telephone and the availability of in person appointments. I also discussed with the patient that there may be a patient responsible charge related to this service. The patient expressed understanding and agreed to proceed.  Location patient: home Location provider: work or home office Participants present for the call: patient, provider Patient did not have a visit in the prior 7 days to address this/these issue(s).   History of Present Illness: Here for pain management. She is doing well.  Indication for chronic opioid: low back pain  Medication and dose: Norco 10-325 # pills per month: 120 Last UDS date: 04-12-20 Opioid Treatment Agreement signed (Y/N): 09-17-17 Opioid Treatment Agreement last reviewed with patient:  06-08-20 [facility] reviewed this encounter (include red flags): Yes    Observations/Objective: Patient sounds cheerful and well on the phone. I do not appreciate any SOB. Speech and thought processing are grossly intact. Patient reported vitals:  Assessment and Plan: Pain management, meds were refilled.  Gershon Crane, MD   Follow Up Instructions:     [phone] 5-10 [phone] 11-20 [phone] 21-30 I did not refer this patient for an OV in the next 24 hours for this/these issue(s).  I discussed the assessment and treatment plan with the patient. The patient was provided an opportunity to ask questions and all were answered. The patient agreed with the plan and demonstrated an understanding of the instructions.   The patient was advised to call back or seek an  in-person evaluation if the symptoms worsen or if the condition fails to improve as anticipated.  I provided 12 minutes of non-face-to-face time during this encounter.   Gershon Crane, MD    Review of Systems     Objective:   Physical Exam        Assessment & Plan:

## 2020-06-09 ENCOUNTER — Telehealth: Payer: PPO | Admitting: Family Medicine

## 2020-06-15 ENCOUNTER — Telehealth (INDEPENDENT_AMBULATORY_CARE_PROVIDER_SITE_OTHER): Payer: PPO | Admitting: Family Medicine

## 2020-06-15 ENCOUNTER — Telehealth: Payer: Self-pay | Admitting: Pharmacist

## 2020-06-15 DIAGNOSIS — R0989 Other specified symptoms and signs involving the circulatory and respiratory systems: Secondary | ICD-10-CM | POA: Diagnosis not present

## 2020-06-15 NOTE — Progress Notes (Signed)
This visit type was conducted due to national recommendations for restrictions regarding the COVID-19 pandemic in an effort to limit this patient's exposure and mitigate transmission in our community.   Virtual Visit via Video Note  I connected with [name] [name] on 06/15/20 at 10:15 AM EST by a video enabled telemedicine application and verified that I am speaking with the correct person using two identifiers.  Location patient: home Location provider:work or home office Persons participating in the virtual visit: patient, provider  I discussed the limitations of evaluation and management by telemedicine and the availability of in person appointments. The patient expressed understanding and agreed to proceed.   HPI:  [name] [name] called with onset yesterday of what she describes as a "deep cough ", headache, body aches, chills, low-grade fever 100.1, diarrhea.  No known sick exposures.  She has not had Covid vaccine.  She states she currently does not have any transportation.  Her husband is currently away working out of town.  [name] does not drive.  She does have a daughter that lives nearby.  Denies any significant dyspnea at rest.  She has increased fatigue.  She has comorbidities including hypertension and obesity.   ROS: See pertinent positives and negatives per HPI.  Past Medical History:  Diagnosis Date  . ADD (attention deficit disorder)   . Allergy    seasonal  . Anxiety   . Arthritis   . Bell's palsy 2008   pt states she had a 'mini stroke' and nervous breakdown which caused facial drooping  . Bipolar 1 disorder (HCC)   . Chronic narcotic use   . CPAP (continuous positive airway pressure) dependence   . Depression    sees the [facility]   . GERD (gastroesophageal reflux disease)   . Hyperlipidemia   . Hypertension    meds no longer needed  . Memory loss    short term  . Meniere disease   . Obesity (BMI 30-39.9) 11/10/2015  . Post concussion syndrome  07/01/2018  . PPD positive 2007 or 2008  . Sleep apnea    on CPAP therapy  . Urinary incontinence   . Vestibular disorder   . Wears dentures   . Wears glasses     Past Surgical History:  Procedure Laterality Date  . ABDOMINAL HYSTERECTOMY    . ANTERIOR FUSION CERVICAL SPINE  2006   Dr. Wynetta Emery  . BREAST BIOPSY Right 1981   benign  . BREAST EXCISIONAL BIOPSY Right 1981   benign   scar does not show  . BREAST SURGERY Bilateral 12-11-12   reductions per Dr. Ivin Booty in [facility]   . CATARACT EXTRACTION, BILATERAL Bilateral last summer of 2018   at [facility]  . COLONOSCOPY  04/25/2016   per Dr. Loreta Ave, adenomatous polyps, repeat in 5 yrs   . DRUG INDUCED ENDOSCOPY N/A 01/30/2019   Procedure: DRUG INDUCED ENDOSCOPY;  Surgeon: Christia Reading, MD;  Location: [facility];  Service: ENT;  Laterality: N/A;  . ESOPHAGOGASTRODUODENOSCOPY  04-23-11   per Dr. Loreta Ave, normal   . EYE SURGERY  06/18/2016   L eyelid   . IMPLANTATION OF HYPOGLOSSAL NERVE STIMULATOR Right 03/25/2019   Procedure: IMPLANTATION OF HYPOGLOSSAL NERVE STIMULATOR;  Surgeon: Christia Reading, MD;  Location: [facility] OR;  Service: ENT;  Laterality: Right;  . LUMBAR DISC SURGERY  2003   Dr. Wynetta Emery  . MULTIPLE TOOTH EXTRACTIONS    . ORIF WRIST FRACTURE Right 08/29/2018  . REDUCTION MAMMAPLASTY Bilateral 2013  .  SPLIT NIGHT STUDY  11/11/2015  . UVULOPALATOPHARYNGOPLASTY    . VAGINAL HYSTERECTOMY  12/96   secondary to fibroids.    Family History  Problem Relation Age of Onset  . Dementia Mother   . Hyperlipidemia Mother   . Asthma Father   . Hypertension Sister   . Hyperlipidemia Sister   . Coronary artery disease Sister   . Thyroid disease Sister   . Hypertension Brother   . Hyperlipidemia Brother   . Coronary artery disease Brother   . Hypertension Sister   . Hyperlipidemia Sister   . Alcohol abuse Other        fhx  . Arthritis Other        fhx  . Coronary artery disease Other        fhx  . Depression  Other        fhx  . Diabetes Other        fhx  . Hyperlipidemia Other        fhx  . Hypertension Other        fhx  . Sudden death Other        fhx    SOCIAL HX: History of nicotine use with cigarettes   Current Outpatient Medications:  .  acyclovir (ZOVIRAX) 200 MG capsule, Take 2 capsules (400 mg total) by mouth daily in the afternoon., Disp: 180 capsule, Rfl: 3 .  alprazolam (XANAX) 2 MG tablet, TAKE 1 TABLET BY MOUTH 3 TIMES A DAY AS NEEDED, Disp: 90 tablet, Rfl: 5 .  amphetamine-dextroamphetamine (ADDERALL) 10 MG tablet, Take 1 tablet (10 mg total) by mouth daily with breakfast., Disp: 30 tablet, Rfl: 0 .  Ascorbic Acid (VITAMIN C) 1000 MG tablet, Take 1,000 mg by mouth daily., Disp: , Rfl:  .  azelastine (ASTELIN) 0.1 % nasal spray, PLACE 2 SPRAYS INTO BOTH NOSTRILS 2 TIMES DAILY AS DIRECTED, Disp: 30 mL, Rfl: 11 .  b complex vitamins capsule, Take 1 capsule by mouth daily., Disp: , Rfl:  .  Cholecalciferol (VITAMIN D3) 125 MCG (5000 UT) CAPS, Take 5,000 Units by mouth daily. , Disp: , Rfl:  .  FLUoxetine (PROZAC) 10 MG capsule, Take 1 capsule (10 mg total) by mouth daily., Disp: 90 capsule, Rfl: 3 .  Glucosamine-Chondroitin (OSTEO BI-FLEX REGULAR STRENGTH PO), Take 1 tablet by mouth daily. Includes 2,000 units of vit D daily, Disp: , Rfl:  .  [START ON 08/09/2020] HYDROcodone-acetaminophen (NORCO) 10-325 MG tablet, Take 1 tablet by mouth every 6 (six) hours as needed., Disp: 120 tablet, Rfl: 0 .  hydrOXYzine (ATARAX/VISTARIL) 50 MG tablet, TAKE 1 TABLET BY MOUTH AT BEDTIME., Disp: 30 tablet, Rfl: 11 .  Magnesium 500 MG CAPS, Take 1 capsule by mouth daily., Disp: , Rfl:  .  melatonin 3 MG TABS tablet, Take 18 mg by mouth at bedtime. , Disp: , Rfl:  .  Multiple Vitamin (MULTIVITAMIN WITH MINERALS) TABS tablet, Take 1 tablet by mouth daily., Disp: , Rfl:  .  omeprazole (PRILOSEC) 20 MG capsule, Take 20 mg by mouth 2 (two) times daily before a meal., Disp: , Rfl:  .  polyethylene  glycol (MIRALAX / GLYCOLAX) 17 g packet, Take 17 g by mouth daily as needed., Disp: , Rfl:  .  potassium chloride (KLOR-CON) 10 MEQ tablet, Take 1 tablet (10 mEq total) by mouth daily., Disp: 30 tablet, Rfl: 2 .  Probiotic Product (PROBIOTIC PO), Take 1 capsule by mouth daily. , Disp: , Rfl:  .  simvastatin (ZOCOR) 80  MG tablet, Take 1 tablet (80 mg total) by mouth at bedtime., Disp: 90 tablet, Rfl: 3 .  VITAMIN E PO, Take by mouth daily. Takes 184mg , Disp: , Rfl:   EXAM:  VITALS per patient if applicable:  GENERAL: alert, oriented, appears well and in no acute distress  HEENT: atraumatic, conjunttiva clear, no obvious abnormalities on inspection of external nose and ears  NECK: normal movements of the head and neck  LUNGS: on inspection no signs of respiratory distress, breathing rate appears normal, no obvious gross SOB, gasping or wheezing  CV: no obvious cyanosis  MS: moves all visible extremities without noticeable abnormality  PSYCH/NEURO: pleasant and cooperative, no obvious depression or anxiety, speech and thought processing grossly intact  ASSESSMENT AND PLAN:  Discussed the following assessment and plan:  Upper respiratory symptoms with cough, nasal congestion, headaches, chills, fever.  Fairly high suspicion this could be Covid.  She is in no distress currently.  Recommend the following  -We advise she try get Covid tested as soon as possible.  If positive she is a candidate for monoclonal antibodies.  She had onset of symptoms yesterday -Plenty of fluids and rest -We have advised to try to get a pulse oximeter to monitor oxygen levels -Follow-up immediately or go to ER or call 911 for any progressive dyspnea     I discussed the assessment and treatment plan with the patient. The patient was provided an opportunity to ask questions and all were answered. The patient agreed with the plan and demonstrated an understanding of the instructions.   The patient was advised  to call back or seek an in-person evaluation if the symptoms worsen or if the condition fails to improve as anticipated.     , MD

## 2020-06-15 NOTE — Chronic Care Management (AMB) (Signed)
Chronic Care Management Pharmacy Assistant   Name: [name] [name]  MRN: [id number] DOB: 1956-04-13  Reason for Encounter: Medication Review  PCP : Nelwyn Salisbury, MD  Allergies:   Allergies  Allergen Reactions   Sulfonamide Derivatives Swelling    Facial swelling   Flexeril [Cyclobenzaprine] Other (See Comments)    Dizziness, made meniere's disease worse    Hydromorphone Itching   Morphine And Related Nausea And Vomiting   Sulfamethoxazole Other (See Comments)    Causes swelling     Medications: Outpatient Encounter Medications as of 06/15/2020  Medication Sig   acyclovir (ZOVIRAX) 200 MG capsule Take 2 capsules (400 mg total) by mouth daily in the afternoon.   alprazolam (XANAX) 2 MG tablet TAKE 1 TABLET BY MOUTH 3 TIMES A DAY AS NEEDED   amphetamine-dextroamphetamine (ADDERALL) 10 MG tablet Take 1 tablet (10 mg total) by mouth daily with breakfast.   Ascorbic Acid (VITAMIN C) 1000 MG tablet Take 1,000 mg by mouth daily.   azelastine (ASTELIN) 0.1 % nasal spray PLACE 2 SPRAYS INTO BOTH NOSTRILS 2 TIMES DAILY AS DIRECTED   b complex vitamins capsule Take 1 capsule by mouth daily.   Cholecalciferol (VITAMIN D3) 125 MCG (5000 UT) CAPS Take 5,000 Units by mouth daily.    FLUoxetine (PROZAC) 10 MG capsule Take 1 capsule (10 mg total) by mouth daily.   Glucosamine-Chondroitin (OSTEO BI-FLEX REGULAR STRENGTH PO) Take 1 tablet by mouth daily. Includes 2,000 units of vit D daily   [START ON 08/09/2020] HYDROcodone-acetaminophen (NORCO) 10-325 MG tablet Take 1 tablet by mouth every 6 (six) hours as needed.   hydrOXYzine (ATARAX/VISTARIL) 50 MG tablet TAKE 1 TABLET BY MOUTH AT BEDTIME.   Magnesium 500 MG CAPS Take 1 capsule by mouth daily.   melatonin 3 MG TABS tablet Take 18 mg by mouth at bedtime.    Multiple Vitamin (MULTIVITAMIN WITH MINERALS) TABS tablet Take 1 tablet by mouth daily.   omeprazole (PRILOSEC) 20 MG capsule Take 20 mg by mouth 2 (two) times  daily before a meal.   polyethylene glycol (MIRALAX / GLYCOLAX) 17 g packet Take 17 g by mouth daily as needed.   potassium chloride (KLOR-CON) 10 MEQ tablet Take 1 tablet (10 mEq total) by mouth daily.   Probiotic Product (PROBIOTIC PO) Take 1 capsule by mouth daily.    simvastatin (ZOCOR) 80 MG tablet Take 1 tablet (80 mg total) by mouth at bedtime.   VITAMIN E PO Take by mouth daily. Takes 184mg    No facility-administered encounter medications on file as of 06/15/2020.    Current Diagnosis: Patient Active Problem List   Diagnosis Date Noted   Hypokalemia 07/23/2018   Post concussion syndrome 07/01/2018   Neck pain 05/06/2017   Angina pectoris (HCC) 06/06/2016   PUD (peptic ulcer disease) 06/06/2016   Attention deficit hyperactivity disorder (ADHD) 02/07/2016   Obesity (BMI 30-39.9) 11/10/2015   Balance disorder 03/02/2015   Bipolar disorder (HCC) 12/13/2009   MEMORY LOSS 07/01/2009   BACK PAIN, LUMBAR 01/21/2009   EDEMA LEG 01/21/2009   HIDRADENITIS SUPPURATIVA 10/04/2008   DIZZINESS 04/16/2008   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   Anxiety 01/23/2008   DEPRESSION 01/23/2008   Essential hypertension 01/23/2008   Allergic rhinitis 01/23/2008   GERD 01/23/2008   INSOMNIA 01/23/2008   URINARY INCONTINENCE 01/23/2008   POSITIVE PPD 01/23/2008   Hyperlipidemia 04/15/2007   Sleep apnea 04/15/2007   Headache 04/15/2007    Goals Addressed   None    Reviewed chart  for medication changes ahead of medication coordination call. Reviewed OVs, Consults, or hospital visits since last care coordination call/Pharmacist visit.  No medication changes indicated.  BP Readings from Last 3 Encounters:  03/17/20 130/90  11/07/19 (!) 101/53  07/14/19 112/70    Lab Results  Component Value Date   HGBA1C 5.5 03/17/2020     Patient obtains medications through Adherence Packaging  30 Days   Last adherence delivery included:   Acyclovir (ZOVIRAX) 200 mg: two  tablets at dinner  Potassium chloride (KLOR-CON) 10 MEQ: one tablet at breakfast  Simvastatin (ZOCOR) 80 mg: one tablet at bedtime Patient declined the following medication last month due to PRN use and additional supply on hand.  Fluoxetine (PROZAC) 10 mg: one capsule at breakfast  Melatonin 10 mg: two tablets at bedtime  Azelastine (ASTELIN) 0.1 % nasal spray: two sprays in each nostril twice a day as needed  I spoke with the patient and review medications. There are no changes in medications currently. The patient is taking the following medications:  Acyclovir (ZOVIRAX) 200 mg: two tablets at dinner  Potassium chloride (KLOR-CON) 10 MEQ: one tablet at breakfast  Simvastatin (ZOCOR) 80 mg: one tablet at bedtime  Fluoxetine (PROZAC) 10 mg: one capsule at breakfast  Melatonin 10 mg: two tablets at bedtime  Azelastine (ASTELIN) 0.1 % nasal spray: two sprays in each nostril twice a day as needed  Alprazolam (XANAX) 2 MG: one tablet three time a day as needed  Hydroxyzine (ATARAX/VISTARIL) 50 MG: one tablet at bedtime  Amphetamine-dextroamphetamine (ADDERALL) 10 MG: One table at breakfast  Hydrocodone-acetaminophen (NORCO) 10-325 MG: one tablet every 6 hours  She does not need refills at this time. Confirmed delivery date of 07-25-2020, advised patient that pharmacy will contact them the morning of delivery.  Follow-Up:  Coordination of Enhanced Pharmacy Services and Pharmacist Review    Berenice Bouton, [facility] Clinical Pharmacy Assistant [phone]

## 2020-06-20 ENCOUNTER — Telehealth: Payer: PPO

## 2020-06-20 NOTE — Chronic Care Management (AMB) (Deleted)
Chronic Care Management Pharmacy  Name: [name] [name]  MRN: [id number] DOB: 04/04/1956  Initial Questions: 1. Have you seen any other providers since your last visit? Yes  2. Any changes in your medicines or health? No   Chief Complaint/ HPI [name] [name],  65 y.o. , female presents for their Follow-Up CCM visit with the clinical pharmacist via telephone due to COVID-19 Pandemic.  PCP : Laurey Morale, MD  Their chronic conditions include: HTN, HLD, GERD, ADHD, allergic rhinitis, low back pain   Office Visits: 06/15/20 Carolann Littler, MD: Patient presented for video visit for upper respiratory symptoms.  06/08/20 Alysia Penna, MD: Patient presented for video visit for pain management. Norco 10-325 mg refilled.   03/17/20 Alysia Penna, MD: Patient presented for annual exam. All labs completed WNL except lipid panel. LDL was 124, decreased from 149 two years ago. No changes made.   02/26/20 Alysia Penna, MD: Patient presented for video visit for pain management. No changes were made. Norco was refilled for 3 month supply.  11/23/19 Alysia Penna, MD: Patient presented for video visit for a rash all over both her arms. Prescribed Medrol dosepak.  Consult Visit: 01/15/20 Patient presented for a sleep study for OSA.   11/06/19 Patient presented to the ED after having a fall in her home from hitting her foot on a vacuum cleaner.  10/06/19 Fransico Him, MD (cardiology): Patient presented for OSA and HTN follow up for fine tuning of her Inspire device. Patient reports not snoring and not sleeping well.   06/03/2019- Patient presented for office visit with Dr. Fransico Him, MD for obstructive sleep apnea. Patient reported intolerant to CPAP. She underwent hypoglossal nerve stimulator with Dr. Redmond Baseman on 03/25/2019. Inspire device settings modified per patient request. HTN is well controlled. BP: 130/25mmHg. Patient not on any antihypertensive medications. For obesity, patient encouraged a  routine exercise program and cut back on carbs and portions.   Medications: Outpatient Encounter Medications as of 06/20/2020  Medication Sig  . acyclovir (ZOVIRAX) 200 MG capsule Take 2 capsules (400 mg total) by mouth daily in the afternoon.  [name] alprazolam (XANAX) 2 MG tablet TAKE 1 TABLET BY MOUTH 3 TIMES A DAY AS NEEDED  . amphetamine-dextroamphetamine (ADDERALL) 10 MG tablet Take 1 tablet (10 mg total) by mouth daily with breakfast.  . Ascorbic Acid (VITAMIN C) 1000 MG tablet Take 1,000 mg by mouth daily.  [name] azelastine (ASTELIN) 0.1 % nasal spray PLACE 2 SPRAYS INTO BOTH NOSTRILS 2 TIMES DAILY AS DIRECTED  . b complex vitamins capsule Take 1 capsule by mouth daily.  . Cholecalciferol (VITAMIN D3) 125 MCG (5000 UT) CAPS Take 5,000 Units by mouth daily.   [name] FLUoxetine (PROZAC) 10 MG capsule Take 1 capsule (10 mg total) by mouth daily.  . Glucosamine-Chondroitin (OSTEO BI-FLEX REGULAR STRENGTH PO) Take 1 tablet by mouth daily. Includes 2,000 units of vit D daily  . [START ON 08/09/2020] HYDROcodone-acetaminophen (NORCO) 10-325 MG tablet Take 1 tablet by mouth every 6 (six) hours as needed.  . hydrOXYzine (ATARAX/VISTARIL) 50 MG tablet TAKE 1 TABLET BY MOUTH AT BEDTIME.  . Magnesium 500 MG CAPS Take 1 capsule by mouth daily.  . melatonin 3 MG TABS tablet Take 18 mg by mouth at bedtime.   . Multiple Vitamin (MULTIVITAMIN WITH MINERALS) TABS tablet Take 1 tablet by mouth daily.  [name] omeprazole (PRILOSEC) 20 MG capsule Take 20 mg by mouth 2 (two) times daily before a meal.  . polyethylene glycol (MIRALAX / GLYCOLAX)  17 g packet Take 17 g by mouth daily as needed.  . potassium chloride (KLOR-CON) 10 MEQ tablet Take 1 tablet (10 mEq total) by mouth daily.  . Probiotic Product (PROBIOTIC PO) Take 1 capsule by mouth daily.   . simvastatin (ZOCOR) 80 MG tablet Take 1 tablet (80 mg total) by mouth at bedtime.  [name] VITAMIN E PO Take by mouth daily. Takes 184mg    No facility-administered encounter  medications on file as of 06/20/2020.     Current Diagnosis/Assessment:  Goals Addressed   None      Hypertension    Office blood pressures are  BP Readings from Last 3 Encounters:  03/17/20 130/90  11/07/19 (!) 101/53  07/14/19 112/70   Patient has failed these meds in the past: none   Patient checks BP at home: does not check at home   Patient is controlled on:  - no medications  - patient stated stress affects BP  We discussed diet and exercise extensively.  . Reducing the amount of salt intake to 1500mg /per day.  . Recommend using a salt substitute to replace your salt if you need flavor. . The importance of checking blood pressure at home if feeling symptomatic.       Plan Managed by Dr. 07/16/19 Continue control with diet and exercise   Hyperlipidemia  LDL goal < 100  Lipid Panel     Component Value Date/Time   CHOL 202 (H) 03/17/2020 1152   TRIG 109 03/17/2020 1152   HDL 56 03/17/2020 1152   CHOLHDL 3.6 03/17/2020 1152   VLDL 49.2 (H) 03/10/2018 1035   LDLCALC 124 (H) 03/17/2020 1152   LDLDIRECT 146.0 03/10/2018 1035    The 10-year ASCVD risk score 03/12/2018 DC Jr., et al., 2013) is: 10.4%   Values used to calculate the score:     Age: 8 years     Sex: Female     Is Non-Hispanic African American: No     Diabetic: No     Tobacco smoker: Yes     Systolic Blood Pressure: 130 mmHg     Is BP treated: No     HDL Cholesterol: 56 mg/dL     Total Cholesterol: 202 mg/dL   Patient has failed these meds in past: none  Patient is currently uncontrolled on the following medications: - simvastatin 80mg , 1 tablet once daily   We discussed:  diet and exercise extensively  . How to reduce cholesterol through diet/weight management and physical activity.    . We discussed how a diet high in plant sterols (fruits/vegetables/nuts/whole grains/legumes) may reduce your cholesterol.  . Exercise: Patient reports she has been doing yoga lately as  exercise  Plan Recommend switching to high intensity statin. Continue current medications   Arthritis pain   Patient is currently on the following medications:  . Osteo Bi-flex (glucosamine condroitin & vitamin D) 2000 units daily  We discussed:  Giving the medicine a few weeks trial before deciding if it is helping; discussed stopping vitamin D supplementation with this as the replacement  Plan  Continue current medications   GERD/ PUD   Patient has failed these meds in past: sucralfate  Patient is currently controlled on the following medications:  - omeprazole 20mg , 1 tablet once daily before a meal   We discussed:  non-pharmacological interventions for acid reflux. Take measures to prevent acid reflux, such as avoiding spicy foods, avoiding caffeine, avoid laying down a few hours after eating, and raising the head of  the bed.  - Patient aware of trigger food and aims to avoid them.  - Discussed the long term risks of using PPIs (osteoporosis, increased infection risk) -Patient expressed interest in stopping this medicine   Plan Continue current medications.  Plan to take omeprazole every other night for two weeks. If no symptoms, plan to stop completely.  ADHD   Patient has failed these meds in past: Adderall XR (insomnia) Patient is currently controlled on the following medications:  - amphetamine- dextroamphetamine (Adderall) 10mg , 1 tablet once daily with breakfast     - patient does not take every day  Plan Continue current medications.   Allergic rhinitis   Patient has failed these meds in past: none  Patient is currently controlled on the following medications:  - azelastine (Astelin) 0.1% nasal spray, 2 sprays into both nostrils twice daily as directed   Plan Continue current medications.   Anxiety/ depression   Patient has failed these meds in past: Lithium, Seroquel, and others  Patient is currently controlled on the following medications:  -  fluoxetine 10mg , 2 tablets once daily - alprazolam 2mg , 1 tablet three times daily as needed (patient reports taking about 2 tablets/day)    We discussed: the long term risks of taking benzodiazepines and possibly needing an increased dose of fluoxetine  Plan Continue current medications  Will discuss tapering at future visit given current circumstances.  Viral prophylaxis   Patient is currently controlled on the following medications:  . Acyclovir 400 mg 1 tablet four times daily - patient only takes once daily     Plan  Will discuss with PCP about switching to valacyclovir to improve adherence. Continue current medications   Low back pain   Patient is currently controlled on the following medications: meloxicam -Hydrocodone/ acetaminophen (Norco) 10/325mg , 1 tablet every six hours as needed for moderate pain (patient reports usage depends on activities of the day; some days she may take every six hours, other days she might not at all - no more than 2 per day)   Plan Continue current medications.  Insomnia   Patient has failed these meds in past: Seroquel  Patient is currently controlled on the following medications:  - Inspire device  - melatonin 3mg , 6 tablets at bedtime   Plan Continue current medications.   Hypokalemia   Patient is currently controlled on the following medications:  - potassium chloride (Klor-Con) , 1 tablet once daily  Potassium 03/17/2020: 4.1 07/21/2018: 3.3   Plan Continue current medications.   Vitamins/supplements   Patient is currently on the following medications:  Magnesium 500 mg every other day  . Vitamin C 1000 mg 1 tablet daily . Vitamin B complex 1 tablet daily  Plan  Continue current medications   Vaccines   Reviewed and discussed patient's vaccination history.    Immunization History  Administered Date(s) Administered  . Influenza Split 04/26/2011  . Influenza Whole 03/23/2010  . Influenza,inj,Quad PF,6+ Mos  03/20/2013, 03/02/2015, 03/26/2016, 03/25/2017, 05/08/2019, 03/17/2020  . Influenza-Unspecified 05/26/2014, 03/03/2018  . Pneumococcal Conjugate-13 04/19/2016  . Pneumococcal Polysaccharide-23 03/07/2018  . Tdap 04/20/2013  . Zoster 03/26/2016  . Zoster Recombinat (Shingrix) 02/18/2019   Never got 2nd dose of shingrix, covid?  Plan  Reassess vaccine history during follow up visit.    Medication Management   Pt [facility] pharmacy for all medications Uses pill box? Yes  Pt endorses 80% compliance - patient reports skipping doses sometimes as she gets distracted during making pill boxes  and unsure of what she has already taken that day  We discussed: Discussed benefits of medication synchronization, packaging and delivery as well as enhanced pharmacist oversight with [facility].   Plan  Utilize [facility] pharmacy for medication synchronization, packaging and delivery   Follow up: 3 month phone visit  Gaylord Shih, PharmD Clinical Pharmacist [facility] [place] [phone]

## 2020-07-11 ENCOUNTER — Telehealth: Payer: Self-pay | Admitting: Family Medicine

## 2020-07-11 DIAGNOSIS — F900 Attention-deficit hyperactivity disorder, predominantly inattentive type: Secondary | ICD-10-CM

## 2020-07-11 NOTE — Telephone Encounter (Signed)
Pt is calling in stating she need a refill on amphetamine-dextroamphetamine (ADDERALL) 10 MG  Pharm:  [facility] on [address]

## 2020-07-11 NOTE — Telephone Encounter (Signed)
Please advise 

## 2020-07-13 MED ORDER — AMPHETAMINE-DEXTROAMPHETAMINE 10 MG PO TABS: 10.0000 mg | ORAL_TABLET | Freq: Every day | ORAL | 0 refills | Status: DC

## 2020-07-14 ENCOUNTER — Telehealth: Payer: Self-pay | Admitting: Pharmacist

## 2020-07-14 NOTE — Chronic Care Management (AMB) (Signed)
Chronic Care Management Pharmacy Assistant   Name: [name] [name]  MRN: [id number] DOB: Sep 06, 1955  Reason for Encounter: Medication Review  PCP : [name] Salisbury, MD  Allergies:   Allergies  Allergen Reactions  . Sulfonamide Derivatives Swelling    Facial swelling  . Flexeril [Cyclobenzaprine] Other (See Comments)    Dizziness, made meniere's disease worse   . Hydromorphone Itching  . Morphine And Related Nausea And Vomiting  . Sulfamethoxazole Other (See Comments)    Causes swelling     Medications: Outpatient Encounter Medications as of 07/14/2020  Medication Sig  . acyclovir (ZOVIRAX) 200 MG capsule Take 2 capsules (400 mg total) by mouth daily in the afternoon.  [name] alprazolam (XANAX) 2 MG tablet TAKE 1 TABLET BY MOUTH 3 TIMES A DAY AS NEEDED  . [START ON 09/10/2020] amphetamine-dextroamphetamine (ADDERALL) 10 MG tablet Take 1 tablet (10 mg total) by mouth daily with breakfast.  . Ascorbic Acid (VITAMIN C) 1000 MG tablet Take 1,000 mg by mouth daily.  [name] azelastine (ASTELIN) 0.1 % nasal spray PLACE 2 SPRAYS INTO BOTH NOSTRILS 2 TIMES DAILY AS DIRECTED  . b complex vitamins capsule Take 1 capsule by mouth daily.  . Cholecalciferol (VITAMIN D3) 125 MCG (5000 UT) CAPS Take 5,000 Units by mouth daily.   [name] FLUoxetine (PROZAC) 10 MG capsule Take 1 capsule (10 mg total) by mouth daily.  . Glucosamine-Chondroitin (OSTEO BI-FLEX REGULAR STRENGTH PO) Take 1 tablet by mouth daily. Includes 2,000 units of vit D daily  . [START ON 08/09/2020] HYDROcodone-acetaminophen (NORCO) 10-325 MG tablet Take 1 tablet by mouth every 6 (six) hours as needed.  . hydrOXYzine (ATARAX/VISTARIL) 50 MG tablet TAKE 1 TABLET BY MOUTH AT BEDTIME.  . Magnesium 500 MG CAPS Take 1 capsule by mouth daily.  . melatonin 3 MG TABS tablet Take 18 mg by mouth at bedtime.   . Multiple Vitamin (MULTIVITAMIN WITH MINERALS) TABS tablet Take 1 tablet by mouth daily.  [name] omeprazole (PRILOSEC) 20 MG capsule Take 20 mg by  mouth 2 (two) times daily before a meal.  . polyethylene glycol (MIRALAX / GLYCOLAX) 17 g packet Take 17 g by mouth daily as needed.  . potassium chloride (KLOR-CON) 10 MEQ tablet Take 1 tablet (10 mEq total) by mouth daily.  . Probiotic Product (PROBIOTIC PO) Take 1 capsule by mouth daily.   . simvastatin (ZOCOR) 80 MG tablet Take 1 tablet (80 mg total) by mouth at bedtime.  [name] VITAMIN E PO Take by mouth daily. Takes 184mg    No facility-administered encounter medications on file as of 07/14/2020.    Current Diagnosis: Patient Active Problem List   Diagnosis Date Noted  . Hypokalemia 07/23/2018  . Post concussion syndrome 07/01/2018  . Neck pain 05/06/2017  . Angina pectoris (HCC) 06/06/2016  . PUD (peptic ulcer disease) 06/06/2016  . Attention deficit hyperactivity disorder (ADHD) 02/07/2016  . Obesity (BMI 30-39.9) 11/10/2015  . Balance disorder 03/02/2015  . Bipolar disorder (HCC) 12/13/2009  . MEMORY LOSS 07/01/2009  . BACK PAIN, LUMBAR 01/21/2009  . EDEMA LEG 01/21/2009  . HIDRADENITIS SUPPURATIVA 10/04/2008  . DIZZINESS 04/16/2008  . 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 Anxiety 01/23/2008  . DEPRESSION 01/23/2008  . Essential hypertension 01/23/2008  . Allergic rhinitis 01/23/2008  . GERD 01/23/2008  . INSOMNIA 01/23/2008  . URINARY INCONTINENCE 01/23/2008  . POSITIVE PPD 01/23/2008  . Hyperlipidemia 04/15/2007  . Sleep apnea 04/15/2007  . Headache 04/15/2007    Goals Addressed   None  Follow-Up:  Pharmacist Review   The patient states that she will be switching back to [facility] pharmacy. She states that it is an easier process for her. She would like her medication to be transferred from [facility] to [facility].   Berenice Bouton, [facility] Clinical Pharmacy Assistant [phone]

## 2020-07-18 ENCOUNTER — Other Ambulatory Visit: Payer: Self-pay | Admitting: Family Medicine

## 2020-07-20 ENCOUNTER — Ambulatory Visit (INDEPENDENT_AMBULATORY_CARE_PROVIDER_SITE_OTHER): Payer: PPO

## 2020-07-20 ENCOUNTER — Other Ambulatory Visit: Payer: Self-pay

## 2020-07-20 DIAGNOSIS — Z0000 Encounter for general adult medical examination without abnormal findings: Secondary | ICD-10-CM

## 2020-07-20 DIAGNOSIS — E2839 Other primary ovarian failure: Secondary | ICD-10-CM

## 2020-07-20 DIAGNOSIS — Z1231 Encounter for screening mammogram for malignant neoplasm of breast: Secondary | ICD-10-CM

## 2020-07-20 NOTE — Patient Instructions (Addendum)
[name] [name] , Thank you for taking time to come for your Medicare Wellness Visit. I appreciate your ongoing commitment to your health goals. Please review the following plan we discussed and let me know if I can assist you in the future.   Screening recommendations/referrals: Colonoscopy: Done 04/25/2016 Mammogram: order placed 07/20/20 Bone Density: Order placed 07/20/20 Recommended yearly ophthalmology/optometry visit for glaucoma screening and checkup Recommended yearly dental visit for hygiene and checkup  Vaccinations: Influenza vaccine: Done 03/17/20 Pneumococcal vaccine: Up to date Tdap vaccine: Up to date Shingles vaccine: Shingrix discussed. Please contact your pharmacy for coverage information.  Pt had 1st dose  02/18/19 Covid-19:Declined at this time   Advanced directives: Please bring a copy of your health care power of attorney and living will to the office at your convenience.  Conditions/risks identified: Keep active and walk more   Next appointment: Follow up in one year for your annual wellness visit   Preventive Care 65 Years and Older, Female Preventive care refers to lifestyle choices and visits with your health care provider that can promote health and wellness. What does preventive care include?  A yearly physical exam. This is also called an annual well check.  Dental exams once or twice a year.  Routine eye exams. Ask your health care provider how often you should have your eyes checked.  Personal lifestyle choices, including:  Daily care of your teeth and gums.  Regular physical activity.  Eating a healthy diet.  Avoiding tobacco and drug use.  Limiting alcohol use.  Practicing safe sex.  Taking low-dose aspirin every day.  Taking vitamin and mineral supplements as recommended by your health care provider. What happens during an annual well check? The services and screenings done by your health care provider during your annual well check will  depend on your age, overall health, lifestyle risk factors, and family history of disease. Counseling  Your health care provider may ask you questions about your:  Alcohol use.  Tobacco use.  Drug use.  Emotional well-being.  Home and relationship well-being.  Sexual activity.  Eating habits.  History of falls.  Memory and ability to understand (cognition).  Work and work Astronomer.  Reproductive health. Screening  You may have the following tests or measurements:  Height, weight, and BMI.  Blood pressure.  Lipid and cholesterol levels. These may be checked every 5 years, or more frequently if you are over 54 years old.  Skin check.  Lung cancer screening. You may have this screening every year starting at age 24 if you have a 30-pack-year history of smoking and currently smoke or have quit within the past 15 years.  Fecal occult blood test (FOBT) of the stool. You may have this test every year starting at age 63.  Flexible sigmoidoscopy or colonoscopy. You may have a sigmoidoscopy every 5 years or a colonoscopy every 10 years starting at age 3.  Hepatitis C blood test.  Hepatitis B blood test.  Sexually transmitted disease (STD) testing.  Diabetes screening. This is done by checking your blood sugar (glucose) after you have not eaten for a while (fasting). You may have this done every 1-3 years.  Bone density scan. This is done to screen for osteoporosis. You may have this done starting at age 57.  Mammogram. This may be done every 1-2 years. Talk to your health care provider about how often you should have regular mammograms. Talk with your health care provider about your test results, treatment options, and if  necessary, the need for more tests. Vaccines  Your health care provider may recommend certain vaccines, such as:  Influenza vaccine. This is recommended every year.  Tetanus, diphtheria, and acellular pertussis (Tdap, Td) vaccine. You may need a  Td booster every 10 years.  Zoster vaccine. You may need this after age 69.  Pneumococcal 13-valent conjugate (PCV13) vaccine. One dose is recommended after age 106.  Pneumococcal polysaccharide (PPSV23) vaccine. One dose is recommended after age 74. Talk to your health care provider about which screenings and vaccines you need and how often you need them. This information is not intended to replace advice given to you by your health care provider. Make sure you discuss any questions you have with your health care provider. Document Released: 07/01/2015 Document Revised: 02/22/2016 Document Reviewed: 04/05/2015 Elsevier Interactive Patient Education  2017 [employer] Prevention in the Home Falls can cause injuries. They can happen to people of all ages. There are many things you can do to make your home safe and to help prevent falls. What can I do on the outside of my home?  Regularly fix the edges of walkways and driveways and fix any cracks.  Remove anything that might make you trip as you walk through a door, such as a raised step or threshold.  Trim any bushes or trees on the path to your home.  Use bright outdoor lighting.  Clear any walking paths of anything that might make someone trip, such as rocks or tools.  Regularly check to see if handrails are loose or broken. Make sure that both sides of any steps have handrails.  Any raised decks and porches should have guardrails on the edges.  Have any leaves, snow, or ice cleared regularly.  Use sand or salt on walking paths during winter.  Clean up any spills in your garage right away. This includes oil or grease spills. What can I do in the bathroom?  Use night lights.  Install grab bars by the toilet and in the tub and shower. Do not use towel bars as grab bars.  Use non-skid mats or decals in the tub or shower.  If you need to sit down in the shower, use a plastic, non-slip stool.  Keep the floor dry. Clean  up any water that spills on the floor as soon as it happens.  Remove soap buildup in the tub or shower regularly.  Attach bath mats securely with double-sided non-slip rug tape.  Do not have throw rugs and other things on the floor that can make you trip. What can I do in the bedroom?  Use night lights.  Make sure that you have a light by your bed that is easy to reach.  Do not use any sheets or blankets that are too big for your bed. They should not hang down onto the floor.  Have a firm chair that has side arms. You can use this for support while you get dressed.  Do not have throw rugs and other things on the floor that can make you trip. What can I do in the kitchen?  Clean up any spills right away.  Avoid walking on wet floors.  Keep items that you use a lot in easy-to-reach places.  If you need to reach something above you, use a strong step stool that has a grab bar.  Keep electrical cords out of the way.  Do not use floor polish or wax that makes floors slippery. If you must  use wax, use non-skid floor wax.  Do not have throw rugs and other things on the floor that can make you trip. What can I do with my stairs?  Do not leave any items on the stairs.  Make sure that there are handrails on both sides of the stairs and use them. Fix handrails that are broken or loose. Make sure that handrails are as long as the stairways.  Check any carpeting to make sure that it is firmly attached to the stairs. Fix any carpet that is loose or worn.  Avoid having throw rugs at the top or bottom of the stairs. If you do have throw rugs, attach them to the floor with carpet tape.  Make sure that you have a light switch at the top of the stairs and the bottom of the stairs. If you do not have them, ask someone to add them for you. What else can I do to help prevent falls?  Wear shoes that:  Do not have high heels.  Have rubber bottoms.  Are comfortable and fit you well.  Are  closed at the toe. Do not wear sandals.  If you use a stepladder:  Make sure that it is fully opened. Do not climb a closed stepladder.  Make sure that both sides of the stepladder are locked into place.  Ask someone to hold it for you, if possible.  Clearly mark and make sure that you can see:  Any grab bars or handrails.  First and last steps.  Where the edge of each step is.  Use tools that help you move around (mobility aids) if they are needed. These include:  Canes.  Walkers.  Scooters.  Crutches.  Turn on the lights when you go into a dark area. Replace any light bulbs as soon as they burn out.  Set up your furniture so you have a clear path. Avoid moving your furniture around.  If any of your floors are uneven, fix them.  If there are any pets around you, be aware of where they are.  Review your medicines with your doctor. Some medicines can make you feel dizzy. This can increase your chance of falling. Ask your doctor what other things that you can do to help prevent falls. This information is not intended to replace advice given to you by your health care provider. Make sure you discuss any questions you have with your health care provider. Document Released: 03/31/2009 Document Revised: 11/10/2015 Document Reviewed: 07/09/2014 Elsevier Interactive Patient Education  2017 [employer].

## 2020-07-20 NOTE — Progress Notes (Signed)
Virtual Visit via Telephone Note  I connected with  [name] [name] on 07/20/20 at  3:15 PM EST by telephone and verified that I am speaking with the correct person using two identifiers.  Location: Patient: Home Provider: Office Persons participating in the virtual visit: patient/Nurse Health Advisor   I discussed the limitations, risks, security and privacy concerns of performing an evaluation and management service by telephone and the availability of in person appointments. The patient expressed understanding and agreed to proceed.  Interactive audio and video telecommunications were attempted between this nurse and patient, however failed, due to patient having technical difficulties OR patient did not have access to video capability.  We continued and completed visit with audio only.  Some vital signs may be absent or patient reported.   Marzella Schlein, LPN    Subjective:   [name] [name] is a 65 y.o. female who presents for Medicare Annual (Subsequent) preventive examination.  Review of Systems     Cardiac Risk Factors include: advanced age (>66men, >52 women);dyslipidemia;hypertension     Objective:    There were no vitals filed for this visit. There is no height or weight on file to calculate BMI.  Advanced Directives 07/20/2020 11/06/2019 08/25/2019 07/14/2019 03/23/2019 01/30/2019 07/25/2018  Does Patient Have a Medical Advance Directive? Yes No Yes No Yes Yes No  Type of Estate agent of [place];Living will - Healthcare Power of [place];Living will - Living will Healthcare Power of [place];Living will -  Does patient want to make changes to medical advance directive? - - No - Patient declined - No - Patient declined No - Patient declined -  Copy of Healthcare Power of Attorney in Chart? No - copy requested - No - copy requested - - No - copy requested -  Would patient like information on creating a medical advance directive? - - - Yes  (MAU/Ambulatory/Procedural Areas - Information given) - - No - Patient declined    Current Medications (verified) Outpatient Encounter Medications as of 07/20/2020  Medication Sig  . acyclovir (ZOVIRAX) 200 MG capsule Take 2 capsules (400 mg total) by mouth daily in the afternoon.  [name] alprazolam (XANAX) 2 MG tablet TAKE 1 TABLET BY MOUTH 3 TIMES A DAY AS NEEDED  . [START ON 09/10/2020] amphetamine-dextroamphetamine (ADDERALL) 10 MG tablet Take 1 tablet (10 mg total) by mouth daily with breakfast.  . Ascorbic Acid (VITAMIN C) 1000 MG tablet Take 1,000 mg by mouth daily.  [name] azelastine (ASTELIN) 0.1 % nasal spray PLACE 2 SPRAYS INTO BOTH NOSTRILS 2 TIMES DAILY AS DIRECTED  . b complex vitamins capsule Take 1 capsule by mouth daily.  . Cholecalciferol (VITAMIN D3) 125 MCG (5000 UT) CAPS Take 5,000 Units by mouth daily.   [name] FLUoxetine (PROZAC) 10 MG capsule Take 1 capsule (10 mg total) by mouth daily.  . Glucosamine-Chondroitin (OSTEO BI-FLEX REGULAR STRENGTH PO) Take 1 tablet by mouth daily. Includes 2,000 units of vit D daily  . [START ON 08/09/2020] HYDROcodone-acetaminophen (NORCO) 10-325 MG tablet Take 1 tablet by mouth every 6 (six) hours as needed.  . hydrOXYzine (ATARAX/VISTARIL) 50 MG tablet TAKE 1 TABLET BY MOUTH AT BEDTIME.  . Magnesium 500 MG CAPS Take 1 capsule by mouth daily.  . melatonin 3 MG TABS tablet Take 18 mg by mouth at bedtime.   . Multiple Vitamin (MULTIVITAMIN WITH MINERALS) TABS tablet Take 1 tablet by mouth daily.  [name] omeprazole (PRILOSEC) 20 MG capsule Take 20 mg by mouth 2 (two) times  daily before a meal.  . polyethylene glycol (MIRALAX / GLYCOLAX) 17 g packet Take 17 g by mouth daily as needed.  . potassium chloride (KLOR-CON) 10 MEQ tablet TAKE 1 TABLET BY MOUTH DAILY.  . Probiotic Product (PROBIOTIC PO) Take 1 capsule by mouth daily.   . simvastatin (ZOCOR) 80 MG tablet Take 1 tablet (80 mg total) by mouth at bedtime.  [name] VITAMIN E PO Take by mouth daily. Takes 184mg     No facility-administered encounter medications on file as of 07/20/2020.    Allergies (verified) Sulfonamide derivatives, Flexeril [cyclobenzaprine], Hydromorphone, Morphine and related, and Sulfamethoxazole   History: Past Medical History:  Diagnosis Date  . ADD (attention deficit disorder)   . Allergy    seasonal  . Anxiety   . Arthritis   . Bell's palsy 2008   pt states she had a 'mini stroke' and nervous breakdown which caused facial drooping  . Bipolar 1 disorder (HCC)   . Chronic narcotic use   . CPAP (continuous positive airway pressure) dependence   . Depression    sees the [facility]   . GERD (gastroesophageal reflux disease)   . Hyperlipidemia   . Hypertension    meds no longer needed  . Memory loss    short term  . Meniere disease   . Obesity (BMI 30-39.9) 11/10/2015  . Post concussion syndrome 07/01/2018  . PPD positive 2007 or 2008  . Sleep apnea    on CPAP therapy  . Urinary incontinence   . Vestibular disorder   . Wears dentures   . Wears glasses    Past Surgical History:  Procedure Laterality Date  . ABDOMINAL HYSTERECTOMY    . ANTERIOR FUSION CERVICAL SPINE  2006   Dr. Wynetta Emery  . BREAST BIOPSY Right 1981   benign  . BREAST EXCISIONAL BIOPSY Right 1981   benign   scar does not show  . BREAST SURGERY Bilateral 12-11-12   reductions per Dr. Ivin Booty in [facility]   . CATARACT EXTRACTION, BILATERAL Bilateral last summer of 2018   at [facility]  . COLONOSCOPY  04/25/2016   per Dr. Loreta Ave, adenomatous polyps, repeat in 5 yrs   . DRUG INDUCED ENDOSCOPY N/A 01/30/2019   Procedure: DRUG INDUCED ENDOSCOPY;  Surgeon: Christia Reading, MD;  Location: [facility];  Service: ENT;  Laterality: N/A;  . ESOPHAGOGASTRODUODENOSCOPY  04-23-11   per Dr. Loreta Ave, normal   . EYE SURGERY  06/18/2016   L eyelid   . IMPLANTATION OF HYPOGLOSSAL NERVE STIMULATOR Right 03/25/2019   Procedure: IMPLANTATION OF HYPOGLOSSAL NERVE STIMULATOR;  Surgeon: Christia Reading, MD;  Location: [facility] OR;  Service: ENT;  Laterality: Right;  . LUMBAR DISC SURGERY  2003   Dr. Wynetta Emery  . MULTIPLE TOOTH EXTRACTIONS    . ORIF WRIST FRACTURE Right 08/29/2018  . REDUCTION MAMMAPLASTY Bilateral 2013  . SPLIT NIGHT STUDY  11/11/2015  . UVULOPALATOPHARYNGOPLASTY    . VAGINAL HYSTERECTOMY  12/96   secondary to fibroids.   Family History  Problem Relation Age of Onset  . Dementia Mother   . Hyperlipidemia Mother   . Asthma Father   . Hypertension Sister   . Hyperlipidemia Sister   . Coronary artery disease Sister   . Thyroid disease Sister   . Hypertension Brother   . Hyperlipidemia Brother   . Coronary artery disease Brother   . Hypertension Sister   . Hyperlipidemia Sister   . Alcohol abuse Other  fhx  . Arthritis Other        fhx  . Coronary artery disease Other        fhx  . Depression Other        fhx  . Diabetes Other        fhx  . Hyperlipidemia Other        fhx  . Hypertension Other        fhx  . Sudden death Other        fhx   Social History   Socioeconomic History  . Marital status: Married    Spouse name: Not on file  . Number of children: 2  . Years of education: Not on file  . Highest education level: Associate degree: academic program  Occupational History  . Occupation: Disabled  Tobacco Use  . Smoking status: Light Tobacco Smoker    Years: 30.00    Types: Cigarettes  . Smokeless tobacco: Never Used  . Tobacco comment: " 1 cigarette every 2 days- maybe "  Vaping Use  . Vaping Use: Never used  Substance and Sexual Activity  . Alcohol use: No    Alcohol/week: 0.0 standard drinks  . Drug use: No  . Sexual activity: Not Currently    Partners: Male    Birth control/protection: Surgical  Other Topics Concern  . Not on file  Social History Narrative   Pt lives in [place], [place].   Right handed    Occasional use of caffeine   Lives with ex-husband         07/10/2018: Lives with ex-husband on one-level house. Divorced,  according to husband, for financial benefit only    Has two children, one local and one in [place]. Has several grandchildren locally who can help her occasionally if needed.   Social Determinants of Health   Financial Resource Strain: Low Risk   . Difficulty of Paying Living Expenses: Not hard at all  Food Insecurity: No Food Insecurity  . Worried About Programme researcher, broadcasting/film/video in the Last Year: Never true  . Ran Out of Food in the Last Year: Never true  Transportation Needs: No Transportation Needs  . Lack of Transportation (Medical): No  . Lack of Transportation (Non-Medical): No  Physical Activity: Inactive  . Days of Exercise per Week: 0 days  . Minutes of Exercise per Session: 0 min  Stress: No Stress Concern Present  . Feeling of Stress : Not at all  Social Connections: Moderately Isolated  . Frequency of Communication with Friends and Family: More than three times a week  . Frequency of Social Gatherings with Friends and Family: Never  . Attends Religious Services: More than 4 times per year  . Active Member of Clubs or Organizations: No  . Attends Banker Meetings: Never  . Marital Status: Separated    Tobacco Counseling Ready to quit: Not Answered Counseling given: Not Answered Comment: " 1 cigarette every 2 days- maybe "   Clinical Intake:  Pre-visit preparation completed: Yes  Pain : No/denies pain     BMI - recorded: 28.24 Nutritional Status: BMI 25 -29 Overweight Nutritional Risks: None Diabetes: No  How often do you need to have someone help you when you read instructions, pamphlets, or other written materials from your doctor or pharmacy?: 1 - Never  Diabetic?No  Interpreter Needed?: No  Information entered by :: Lanier Ensign,   LPN   Activities of Daily Living In your present state of health, do you  have any difficulty performing the following activities: 07/20/2020  Hearing? N  Vision? N  Difficulty concentrating or making decisions? Y   Comment some memory issues at times  Walking or climbing stairs? Y  Comment take time  Dressing or bathing? N  Doing errands, shopping? N  Preparing Food and eating ? N  Using the Toilet? N  In the past six months, have you accidently leaked urine? Y  Comment wears a pad daily  Do you have problems with loss of bowel control? N  Managing your Medications? N  Managing your Finances? N  Housekeeping or managing your Housekeeping? N  Some recent data might be hidden    Patient Care Team: Nelwyn Salisbury, MD as PCP - General [name], MD as Consulting Physician (Cardiology) Verner Chol, [facility] as Pharmacist (Pharmacist)  Indicate any recent Medical Services you may have received from other than [facility] providers in the past year (date may be approximate).     Assessment:   This is a routine wellness examination for Ani.  Hearing/Vision screen  Hearing Screening   125Hz  250Hz  500Hz  1000Hz  2000Hz  3000Hz  4000Hz  6000Hz  8000Hz   Right ear:           Left ear:           Comments: Pt denies any hearing issues  Vision Screening Comments: [facility]   Dietary issues and exercise activities discussed: Current Exercise Habits: The patient does not participate in regular exercise at present  Goals    . Exercise 150 min/wk Moderate Activity     Increase physical activity     . Patient Stated     Keep active and walk more     . Pharmacy Care Plan     CARE PLAN ENTRY Current Barriers:  . Chronic Disease Management support, education, and care coordination needs related to HTN, HLD, and GERD, ADHD, Allergic rhinitis, low back pain   Pharmacist Clinical Goal(s):  Over next 6 months, patient will work on the following goals:  . Blood pressure:  Maintain Blood pressure <130/80 mmHg  . Maintain low salt diet.  . High cholesterol:  . Cholesterol goals: Total Cholesterol goal under 200, Triglycerides goal under 150, HDL goal above 40 (men)  or above 50 (women), LDL goal under 100.  GERD/ PUD . Minimize reflux symptoms.   Interventions: . Comprehensive medication review performed. . Blood pressure:  . Discussed need to continue checking blood pressure at home.  . Discussed diet modifications. DASH diet:  following a diet emphasizing fruits and vegetables and low-fat dairy products along with whole grains, fish, poultry, and nuts. Reducing red meats and sugars.  . Exercising   . Reducing the amount of salt intake to 1500mg /per day.  . Recommend using a salt substitute to replace your salt if you need flavor.    . Getting enough potassium in your diet equaling 3500-5000mg /day.  This helps to regulate BP by balancing out the effects of salt.   . Weight reduction- We discussed losing 5-10% of body weight . Continue: control with diet and exercise.  . High cholesterol . How to reduce cholesterol through diet/weight management and physical activity.    . We discussed how a diet high in plant sterols (fruits/vegetables/nuts/whole grains/legumes) may reduce your cholesterol.  Encouraged increasing fiber to a daily intake of 10-25g/day  . Continue:  simvastatin 80mg , 1 tablet once daily  . GERD: . Discussed non-pharmacological interventions for  acid reflux. Take measures to prevent acid reflux, such as avoiding spicy foods, avoiding caffeine, avoid laying down a few hours after eating, and raising the head of the bed. . Continue: omeprazole 20mg , 1 tablet every other day before a meal . Plan to stop medicine in 2 weeks if there is no difference in heartburn symptoms . ADHD . Continue: amphetamine- dextroamphetamine (Adderall) 10mg , 1 tablet once daily with breakfast   . Anxiety/ depression . Continue:  - fluoxetine 10mg , 2 tablets once daily - alprazolam 2mg , 1 tablet three times daily as needed . Low back pain . Continue: Hydrocodone/ acetaminophen (Norco) 10/325mg , 1 tablet every six hours as needed for moderate pain   . Insomnia . Continue follow up appointments with Dr. Mayford Knife.  . Continue:  - Melatonin 3mg , 1 tablet at bedtime  Patient Self Care Activities:  . Patient verbalizes understanding of plan as described above, Self administers medications as prescribed, Calls pharmacy for medication refills, and Calls provider office for new concerns or questions . Plan to switch to [facility] for adherence packaging  Please see past updates related to this goal by clicking on the "Past Updates" button in the selected goal      . Quit Smoking     Find effective way to deal with negative energy such as exercise and staying busy with a project      Depression Screen PHQ 2/9 Scores 07/20/2020 06/08/2020 07/14/2019 07/25/2018 07/10/2018 07/09/2017  PHQ - 2 Score 0 6 1 1 2 2   PHQ- 9 Score - 20 - - 19 7    Fall Risk Fall Risk  07/20/2020 06/08/2020 07/14/2019 08/11/2018 07/10/2018  Falls in the past year? 1 1 0 1 1  Number falls in past yr: 1 1 - 1 1  Injury with Fall? 0 0 - 1 -  Risk for fall due to : Impaired mobility;Impaired balance/gait;Impaired vision;History of fall(s) - Medication side effect - History of fall(s);Impaired vision;Impaired balance/gait;Medication side effect;Impaired mobility;Mental status change  Follow up Education provided Falls evaluation completed Falls evaluation completed;Education provided;Falls prevention discussed Falls evaluation completed Falls evaluation completed;Education provided;Falls prevention discussed  Comment - - - - -    FALL RISK PREVENTION PERTAINING TO THE HOME:  Any stairs in or around the home? Yes  If so, are there any without handrails? No  Home free of loose throw rugs in walkways, pet beds, electrical cords, etc? Yes  Adequate lighting in your home to reduce risk of falls? Yes   ASSISTIVE DEVICES UTILIZED TO PREVENT FALLS:  Life alert? No  Use of a cane, walker or w/c? Yes  Grab bars in the bathroom? Yes  Shower chair or bench in shower? Yes   Elevated toilet seat or a handicapped toilet? No   TIMED UP AND GO:  Was the test performed? No .     Cognitive Function: MMSE - Mini Mental State Exam 03/30/2016  Orientation to time 5  Orientation to Place 4  Registration 3  Attention/ Calculation 4  Recall 2  Language- name 2 objects 2  Language- repeat 1  Language- follow 3 step command 3  Language- read & follow direction 1  Write a sentence 1  Copy design 1  Total score 27     6CIT Screen 07/20/2020 07/14/2019  What Year? 0 points 0 points  What month? 0 points 0 points  What time? - 0 points  Count back from 20 0 points 0 points  Months in reverse 2 points 0  points  Repeat phrase 4 points 0 points  Total Score - 0    Immunizations Immunization History  Administered Date(s) Administered  . Influenza Split 04/26/2011  . Influenza Whole 03/23/2010  . Influenza,inj,Quad PF,6+ Mos 03/20/2013, 03/02/2015, 03/26/2016, 03/25/2017, 05/08/2019, 03/17/2020  . Influenza-Unspecified 05/26/2014, 03/03/2018  . Pneumococcal Conjugate-13 04/19/2016  . Pneumococcal Polysaccharide-23 03/07/2018  . Tdap 04/20/2013  . Zoster 03/26/2016  . Zoster Recombinat (Shingrix) 02/18/2019    TDAP status: Up to date  Flu Vaccine status: Up to date  Done 03/17/20 Pneumococcal vaccine status: Up to date  Covid-19 vaccine status: Declined, Education has been provided regarding the importance of this vaccine but patient still declined. Advised may receive this vaccine at local pharmacy or Health Dept.or vaccine clinic. Aware to provide a copy of the vaccination record if obtained from local pharmacy or Health Dept. Verbalized acceptance and understanding.  Qualifies for Shingles Vaccine? Yes   Zostavax completed Yes   Shingrix Completed?: No.    Education has been provided regarding the importance of this vaccine. Patient has been advised to call insurance company to determine out of pocket expense if they have not yet received this vaccine.  Advised may also receive vaccine at local pharmacy or Health Dept. Verbalized acceptance and understanding.  Screening Tests Health Maintenance  Topic Date Due  . PAP SMEAR-Modifier  07/10/2019  . MAMMOGRAM  03/26/2020  . DEXA SCAN  06/14/2020  . COVID-19 Vaccine (1) 08/05/2020 (Originally 06/14/1961)  . PNA vac Low Risk Adult (2 of 2 - PPSV23) 03/08/2023  . TETANUS/TDAP  04/21/2023  . COLONOSCOPY (Pts [phone] Insurance coverage will need to be confirmed)  04/25/2026  . INFLUENZA VACCINE  Completed  . Hepatitis C Screening  Completed  . HIV Screening  Completed    Health Maintenance  Health Maintenance Due  Topic Date Due  . PAP SMEAR-Modifier  07/10/2019  . MAMMOGRAM  03/26/2020  . DEXA SCAN  06/14/2020    Colorectal cancer screening: Type of screening: Colonoscopy. Completed 04/25/16. Repeat every 10 years  Mammogram status: Completed 03/26/18. Repeat every year  Bone density order placed 07/20/20   Additional Screening:  Hepatitis C Screening: Completed 08/08/15  Vision Screening: Recommended annual ophthalmology exams for early detection of glaucoma and other disorders of the eye. Is the patient up to date with their annual eye exam?  Yes  Who is the provider or what is the name of the office in which the patient attends annual eye exams? [facility]    Dental Screening: Recommended annual dental exams for proper oral hygiene  Community Resource Referral / Chronic Care Management: CRR required this visit?  No   CCM required this visit?  No      Plan:     I have personally reviewed and noted the following in the patient's chart:   . Medical and social history . Use of alcohol, tobacco or illicit drugs  . Current medications and supplements . Functional ability and status . Nutritional status . Physical activity . Advanced directives . List of other physicians . Hospitalizations, surgeries, and ER visits in previous 12 months . Vitals . Screenings to  include cognitive, depression, and falls . Referrals and appointments  In addition, I have reviewed and discussed with patient certain preventive protocols, quality metrics, and best practice recommendations. A written personalized care plan for preventive services as well as general preventive health recommendations were provided to patient.     Marzella Schleinina H Brandye Inthavong, LPN   8/1/19142/07/2020   Nurse  Notes: None

## 2020-07-25 ENCOUNTER — Telehealth: Payer: Self-pay | Admitting: Pharmacist

## 2020-07-25 NOTE — Chronic Care Management (AMB) (Signed)
Chronic Care Management Pharmacy Assistant   Name: Khaleesi Gruel  MRN: [id number] DOB: Jun 01, 1956  Reason for Encounter: General Adherence Call  PCP : Nelwyn Salisbury, MD  Allergies:   Allergies  Allergen Reactions  . Sulfonamide Derivatives Swelling    Facial swelling  . Flexeril [Cyclobenzaprine] Other (See Comments)    Dizziness, made meniere's disease worse   . Hydromorphone Itching  . Morphine And Related Nausea And Vomiting  . Sulfamethoxazole Other (See Comments)    Causes swelling     Medications: Outpatient Encounter Medications as of 07/25/2020  Medication Sig  . acyclovir (ZOVIRAX) 200 MG capsule Take 2 capsules (400 mg total) by mouth daily in the afternoon.  [name] alprazolam (XANAX) 2 MG tablet TAKE 1 TABLET BY MOUTH 3 TIMES A DAY AS NEEDED  . [START ON 09/10/2020] amphetamine-dextroamphetamine (ADDERALL) 10 MG tablet Take 1 tablet (10 mg total) by mouth daily with breakfast.  . Ascorbic Acid (VITAMIN C) 1000 MG tablet Take 1,000 mg by mouth daily.  [name] azelastine (ASTELIN) 0.1 % nasal spray PLACE 2 SPRAYS INTO BOTH NOSTRILS 2 TIMES DAILY AS DIRECTED  . b complex vitamins capsule Take 1 capsule by mouth daily.  . Cholecalciferol (VITAMIN D3) 125 MCG (5000 UT) CAPS Take 5,000 Units by mouth daily.   [name] FLUoxetine (PROZAC) 10 MG capsule Take 1 capsule (10 mg total) by mouth daily.  . Glucosamine-Chondroitin (OSTEO BI-FLEX REGULAR STRENGTH PO) Take 1 tablet by mouth daily. Includes 2,000 units of vit D daily  . [START ON 08/09/2020] HYDROcodone-acetaminophen (NORCO) 10-325 MG tablet Take 1 tablet by mouth every 6 (six) hours as needed.  . hydrOXYzine (ATARAX/VISTARIL) 50 MG tablet TAKE 1 TABLET BY MOUTH AT BEDTIME.  . Magnesium 500 MG CAPS Take 1 capsule by mouth daily.  . melatonin 3 MG TABS tablet Take 18 mg by mouth at bedtime.   . Multiple Vitamin (MULTIVITAMIN WITH MINERALS) TABS tablet Take 1 tablet by mouth daily.  [name] omeprazole (PRILOSEC) 20 MG capsule Take 20  mg by mouth 2 (two) times daily before a meal.  . polyethylene glycol (MIRALAX / GLYCOLAX) 17 g packet Take 17 g by mouth daily as needed.  . potassium chloride (KLOR-CON) 10 MEQ tablet TAKE 1 TABLET BY MOUTH DAILY.  . Probiotic Product (PROBIOTIC PO) Take 1 capsule by mouth daily.   . simvastatin (ZOCOR) 80 MG tablet Take 1 tablet (80 mg total) by mouth at bedtime.  [name] VITAMIN E PO Take by mouth daily. Takes 184mg    No facility-administered encounter medications on file as of 07/25/2020.    Current Diagnosis: Patient Active Problem List   Diagnosis Date Noted  . Hypokalemia 07/23/2018  . Post concussion syndrome 07/01/2018  . Neck pain 05/06/2017  . Angina pectoris (HCC) 06/06/2016  . PUD (peptic ulcer disease) 06/06/2016  . Attention deficit hyperactivity disorder (ADHD) 02/07/2016  . Obesity (BMI 30-39.9) 11/10/2015  . Balance disorder 03/02/2015  . Bipolar disorder (HCC) 12/13/2009  . MEMORY LOSS 07/01/2009  . BACK PAIN, LUMBAR 01/21/2009  . EDEMA LEG 01/21/2009  . HIDRADENITIS SUPPURATIVA 10/04/2008  . DIZZINESS 04/16/2008  . 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 Anxiety 01/23/2008  . DEPRESSION 01/23/2008  . Essential hypertension 01/23/2008  . Allergic rhinitis 01/23/2008  . GERD 01/23/2008  . INSOMNIA 01/23/2008  . URINARY INCONTINENCE 01/23/2008  . POSITIVE PPD 01/23/2008  . Hyperlipidemia 04/15/2007  . Sleep apnea 04/15/2007  . Headache 04/15/2007    Goals Addressed   None  Follow-Up:  Pharmacist Review and Scheduled Follow-Up With Clinical Pharmacist  I spoke with the patient and discussed medication adherence with the patient, no issues at this time with current medication. She states that she has been doing well. She states that all her medications have returned to [employer]. She also states that she is sleeping much better than at her last appointment. She just completed her medical Wellness for this year. New CPP appointment made for September 21, 2020. She  denies ED visits since his last CPP follow-up. The patient denies any side effects with his medication. Also, denies any problems with his current pharmacy.   Berenice Bouton, [facility] Clinical Pharmacy Assistant [phone]

## 2020-07-28 ENCOUNTER — Emergency Department (HOSPITAL_COMMUNITY)
Admission: EM | Admit: 2020-07-28 | Discharge: 2020-07-29 | Disposition: A | Discharge status: Home / Self Care | Payer: PPO | Attending: Emergency Medicine | Admitting: Emergency Medicine

## 2020-07-28 ENCOUNTER — Encounter (HOSPITAL_COMMUNITY): Payer: Self-pay

## 2020-07-28 ENCOUNTER — Other Ambulatory Visit: Payer: Self-pay

## 2020-07-28 DIAGNOSIS — F319 Bipolar disorder, unspecified: Secondary | ICD-10-CM | POA: Insufficient documentation

## 2020-07-28 DIAGNOSIS — F312 Bipolar disorder, current episode manic severe with psychotic features: Secondary | ICD-10-CM | POA: Insufficient documentation

## 2020-07-28 DIAGNOSIS — R45851 Suicidal ideations: Secondary | ICD-10-CM

## 2020-07-28 DIAGNOSIS — I10 Essential (primary) hypertension: Secondary | ICD-10-CM | POA: Diagnosis not present

## 2020-07-28 DIAGNOSIS — Z20822 Contact with and (suspected) exposure to covid-19: Secondary | ICD-10-CM | POA: Insufficient documentation

## 2020-07-28 DIAGNOSIS — R454 Irritability and anger: Secondary | ICD-10-CM | POA: Insufficient documentation

## 2020-07-28 DIAGNOSIS — F909 Attention-deficit hyperactivity disorder, unspecified type: Secondary | ICD-10-CM | POA: Insufficient documentation

## 2020-07-28 DIAGNOSIS — F419 Anxiety disorder, unspecified: Secondary | ICD-10-CM | POA: Diagnosis not present

## 2020-07-28 DIAGNOSIS — Z79899 Other long term (current) drug therapy: Secondary | ICD-10-CM | POA: Diagnosis not present

## 2020-07-28 DIAGNOSIS — F17210 Nicotine dependence, cigarettes, uncomplicated: Secondary | ICD-10-CM | POA: Diagnosis not present

## 2020-07-28 DIAGNOSIS — F411 Generalized anxiety disorder: Secondary | ICD-10-CM | POA: Diagnosis present

## 2020-07-28 DIAGNOSIS — R569 Unspecified convulsions: Secondary | ICD-10-CM | POA: Diagnosis not present

## 2020-07-28 DIAGNOSIS — F29 Unspecified psychosis not due to a substance or known physiological condition: Secondary | ICD-10-CM | POA: Diagnosis not present

## 2020-07-28 DIAGNOSIS — R456 Violent behavior: Secondary | ICD-10-CM | POA: Diagnosis not present

## 2020-07-28 DIAGNOSIS — R442 Other hallucinations: Secondary | ICD-10-CM | POA: Diagnosis not present

## 2020-07-28 DIAGNOSIS — R0902 Hypoxemia: Secondary | ICD-10-CM | POA: Diagnosis not present

## 2020-07-28 LAB — COMPREHENSIVE METABOLIC PANEL
ALT: 24 U/L (ref 0–44)
AST: 58 U/L — ABNORMAL HIGH (ref 15–41)
Albumin: 4.5 g/dL (ref 3.5–5.0)
Alkaline Phosphatase: 65 U/L (ref 38–126)
Anion gap: 13 (ref 5–15)
BUN: 15 mg/dL (ref 8–23)
CO2: 27 mmol/L (ref 22–32)
Calcium: 9.9 mg/dL (ref 8.9–10.3)
Chloride: 100 mmol/L (ref 98–111)
Creatinine, Ser: 0.75 mg/dL (ref 0.44–1.00)
GFR, Estimated: >60 mL/min (ref >60)
Glucose, Bld: 106 mg/dL — ABNORMAL HIGH (ref 70–99)
Potassium: 2.8 mmol/L — ABNORMAL LOW (ref 3.5–5.1)
Sodium: 140 mmol/L (ref 135–145)
Total Bilirubin: 0.9 mg/dL (ref 0.3–1.2)
Total Protein: 8.4 g/dL — ABNORMAL HIGH (ref 6.5–8.1)

## 2020-07-28 LAB — CBC WITH DIFFERENTIAL/PLATELET
Abs Immature Granulocytes: 0.04 10*3/uL (ref 0.00–0.07)
Basophils Absolute: 0 10*3/uL (ref 0.0–0.1)
Basophils Relative: 0 %
Eosinophils Absolute: 0.1 10*3/uL (ref 0.0–0.5)
Eosinophils Relative: 1 %
HCT: 38.6 % (ref 36.0–46.0)
Hemoglobin: 12.9 g/dL (ref 12.0–15.0)
Immature Granulocytes: 0 %
Lymphocytes Relative: 17 %
Lymphs Abs: 1.9 10*3/uL (ref 0.7–4.0)
MCH: 30.2 pg (ref 26.0–34.0)
MCHC: 33.4 g/dL (ref 30.0–36.0)
MCV: 90.4 fL (ref 80.0–100.0)
Monocytes Absolute: 0.8 10*3/uL (ref 0.1–1.0)
Monocytes Relative: 7 %
Neutro Abs: 8.4 10*3/uL — ABNORMAL HIGH (ref 1.7–7.7)
Neutrophils Relative %: 75 %
Platelets: 211 10*3/uL (ref 150–400)
RBC: 4.27 MIL/uL (ref 3.87–5.11)
RDW: 13.9 % (ref 11.5–15.5)
WBC: 11.2 10*3/uL — ABNORMAL HIGH (ref 4.0–10.5)
nRBC: 0 % (ref 0.0–0.2)

## 2020-07-28 LAB — RAPID URINE DRUG SCREEN, HOSP PERFORMED
Amphetamines: NOT DETECTED
Barbiturates: NOT DETECTED
Benzodiazepines: POSITIVE — AB
Cocaine: NOT DETECTED
Opiates: POSITIVE — AB
Tetrahydrocannabinol: NOT DETECTED

## 2020-07-28 LAB — RESP PANEL BY RT-PCR (FLU A&B, COVID) ARPGX2
Influenza A by PCR: NEGATIVE
Influenza B by PCR: NEGATIVE
SARS Coronavirus 2 by RT PCR: NEGATIVE

## 2020-07-28 LAB — ACETAMINOPHEN LEVEL: Acetaminophen (Tylenol), Serum: <10 ug/mL — ABNORMAL LOW (ref 10–30)

## 2020-07-28 LAB — ETHANOL: Alcohol, Ethyl (B): <10 mg/dL (ref <10)

## 2020-07-28 LAB — SALICYLATE LEVEL: Salicylate Lvl: <7 mg/dL — ABNORMAL LOW (ref 7.0–30.0)

## 2020-07-28 MED ORDER — PANTOPRAZOLE SODIUM 40 MG PO TBEC
40.0000 mg | DELAYED_RELEASE_TABLET | Freq: Every day | ORAL | Status: DC
  Administered 2020-07-28 – 2020-07-29 (×2): 40 mg via ORAL
  Filled 2020-07-28 (×2): qty 1

## 2020-07-28 MED ORDER — POTASSIUM CHLORIDE CRYS ER 20 MEQ PO TBCR
40.0000 meq | EXTENDED_RELEASE_TABLET | Freq: Once | ORAL | Status: AC
  Administered 2020-07-28: 40 meq via ORAL
  Filled 2020-07-28: qty 2

## 2020-07-28 MED ORDER — POTASSIUM CHLORIDE ER 10 MEQ PO TBCR
10.0000 meq | EXTENDED_RELEASE_TABLET | Freq: Every day | ORAL | Status: DC
  Administered 2020-07-29: 10 meq via ORAL
  Filled 2020-07-28 (×2): qty 1

## 2020-07-28 MED ORDER — HYDROXYZINE HCL 25 MG PO TABS
50.0000 mg | ORAL_TABLET | Freq: Every day | ORAL | Status: DC
  Administered 2020-07-28: 50 mg via ORAL
  Filled 2020-07-28: qty 2

## 2020-07-28 MED ORDER — FLUOXETINE HCL 10 MG PO CAPS
10.0000 mg | ORAL_CAPSULE | Freq: Every day | ORAL | Status: DC
  Administered 2020-07-28 – 2020-07-29 (×2): 10 mg via ORAL
  Filled 2020-07-28 (×2): qty 1

## 2020-07-28 MED ORDER — MELATONIN 5 MG PO TABS
18.0000 mg | ORAL_TABLET | Freq: Every day | ORAL | Status: DC
  Administered 2020-07-28: 18 mg via ORAL
  Filled 2020-07-28: qty 1

## 2020-07-28 MED ORDER — ATORVASTATIN CALCIUM 40 MG PO TABS
40.0000 mg | ORAL_TABLET | Freq: Every day | ORAL | Status: DC
  Administered 2020-07-28 – 2020-07-29 (×2): 40 mg via ORAL
  Filled 2020-07-28 (×2): qty 1

## 2020-07-28 MED ORDER — AMPHETAMINE-DEXTROAMPHETAMINE 10 MG PO TABS: 10.0000 mg | ORAL_TABLET | Freq: Every day | ORAL | Status: DC

## 2020-07-28 NOTE — ED Provider Notes (Addendum)
[facility]-EMERGENCY DEPT Provider Note   CSN: [id number] Arrival date & time: 07/28/20  0923     History Chief Complaint  Patient presents with  . Suicidal    [name] [name] is a 65 y.o. female.  65 year old female presents with suicidal ideations.  Patient called her ex-husband and told him that she might live anymore.  He then called 911.  She states she has a history of bipolar disorder and states she was cycling with mania last night.  Been off her medications for the past 3 weeks.  Has got there stated that she was going to die.  EMS gave patient 5 mg of Versed along with 2.5 g of Haldol.        Past Medical History:  Diagnosis Date  . ADD (attention deficit disorder)   . Allergy    seasonal  . Anxiety   . Arthritis   . Bell's palsy 2008   pt states she had a 'mini stroke' and nervous breakdown which caused facial drooping  . Bipolar 1 disorder (HCC)   . Chronic narcotic use   . CPAP (continuous positive airway pressure) dependence   . Depression    sees the [facility]   . GERD (gastroesophageal reflux disease)   . Hyperlipidemia   . Hypertension    meds no longer needed  . Memory loss    short term  . Meniere disease   . Obesity (BMI 30-39.9) 11/10/2015  . Post concussion syndrome 07/01/2018  . PPD positive 2007 or 2008  . Sleep apnea    on CPAP therapy  . Urinary incontinence   . Vestibular disorder   . Wears dentures   . Wears glasses     Patient Active Problem List   Diagnosis Date Noted  . Hypokalemia 07/23/2018  . Post concussion syndrome 07/01/2018  . Neck pain 05/06/2017  . Angina pectoris (HCC) 06/06/2016  . PUD (peptic ulcer disease) 06/06/2016  . Attention deficit hyperactivity disorder (ADHD) 02/07/2016  . Obesity (BMI 30-39.9) 11/10/2015  . Balance disorder 03/02/2015  . Bipolar disorder (HCC) 12/13/2009  . MEMORY LOSS 07/01/2009  . BACK PAIN, LUMBAR 01/21/2009  . EDEMA LEG 01/21/2009  . HIDRADENITIS  SUPPURATIVA 10/04/2008  . DIZZINESS 04/16/2008  . 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 Anxiety 01/23/2008  . DEPRESSION 01/23/2008  . Essential hypertension 01/23/2008  . Allergic rhinitis 01/23/2008  . GERD 01/23/2008  . INSOMNIA 01/23/2008  . URINARY INCONTINENCE 01/23/2008  . POSITIVE PPD 01/23/2008  . Hyperlipidemia 04/15/2007  . Sleep apnea 04/15/2007  . Headache 04/15/2007    Past Surgical History:  Procedure Laterality Date  . ABDOMINAL HYSTERECTOMY    . ANTERIOR FUSION CERVICAL SPINE  2006   Dr. Wynetta Emery  . BREAST BIOPSY Right 1981   benign  . BREAST EXCISIONAL BIOPSY Right 1981   benign   scar does not show  . BREAST SURGERY Bilateral 12-11-12   reductions per Dr. Ivin Booty in [facility]   . CATARACT EXTRACTION, BILATERAL Bilateral last summer of 2018   at [facility]  . COLONOSCOPY  04/25/2016   per Dr. Loreta Ave, adenomatous polyps, repeat in 5 yrs   . DRUG INDUCED ENDOSCOPY N/A 01/30/2019   Procedure: DRUG INDUCED ENDOSCOPY;  Surgeon: Christia Reading, MD;  Location: [facility];  Service: ENT;  Laterality: N/A;  . ESOPHAGOGASTRODUODENOSCOPY  04-23-11   per Dr. Loreta Ave, normal   . EYE SURGERY  06/18/2016   L eyelid   . IMPLANTATION OF HYPOGLOSSAL NERVE  STIMULATOR Right 03/25/2019   Procedure: IMPLANTATION OF HYPOGLOSSAL NERVE STIMULATOR;  Surgeon: Christia Reading, MD;  Location: [facility] OR;  Service: ENT;  Laterality: Right;  . LUMBAR DISC SURGERY  2003   Dr. Wynetta Emery  . MULTIPLE TOOTH EXTRACTIONS    . ORIF WRIST FRACTURE Right 08/29/2018  . REDUCTION MAMMAPLASTY Bilateral 2013  . SPLIT NIGHT STUDY  11/11/2015  . UVULOPALATOPHARYNGOPLASTY    . VAGINAL HYSTERECTOMY  12/96   secondary to fibroids.     OB History    Gravida  2   Para  2   Term  2   Preterm      AB      Living  2     SAB      IAB      Ectopic      Multiple      Live Births              Family History  Problem Relation Age of Onset  . Dementia Mother   . Hyperlipidemia Mother   .  Asthma Father   . Hypertension Sister   . Hyperlipidemia Sister   . Coronary artery disease Sister   . Thyroid disease Sister   . Hypertension Brother   . Hyperlipidemia Brother   . Coronary artery disease Brother   . Hypertension Sister   . Hyperlipidemia Sister   . Alcohol abuse Other        fhx  . Arthritis Other        fhx  . Coronary artery disease Other        fhx  . Depression Other        fhx  . Diabetes Other        fhx  . Hyperlipidemia Other        fhx  . Hypertension Other        fhx  . Sudden death Other        fhx    Social History   Tobacco Use  . Smoking status: Light Tobacco Smoker    Years: 30.00    Types: Cigarettes  . Smokeless tobacco: Never Used  . Tobacco comment: " 1 cigarette every 2 days- maybe "  Vaping Use  . Vaping Use: Never used  Substance Use Topics  . Alcohol use: No    Alcohol/week: 0.0 standard drinks  . Drug use: No    Home Medications Prior to Admission medications   Medication Sig Start Date End Date Taking? Authorizing Provider  acyclovir (ZOVIRAX) 200 MG capsule Take 2 capsules (400 mg total) by mouth daily in the afternoon. 03/29/20   Nelwyn Salisbury, MD  alprazolam Prudy Feeler) 2 MG tablet TAKE 1 TABLET BY MOUTH 3 TIMES A DAY AS NEEDED 01/19/20   Nelwyn Salisbury, MD  amphetamine-dextroamphetamine (ADDERALL) 10 MG tablet Take 1 tablet (10 mg total) by mouth daily with breakfast. 09/10/20 10/10/20  Nelwyn Salisbury, MD  Ascorbic Acid (VITAMIN C) 1000 MG tablet Take 1,000 mg by mouth daily.    [provider]  azelastine (ASTELIN) 0.1 % nasal spray PLACE 2 SPRAYS INTO BOTH NOSTRILS 2 TIMES DAILY AS DIRECTED 04/25/20   Nelwyn Salisbury, MD  b complex vitamins capsule Take 1 capsule by mouth daily.    [provider]  Cholecalciferol (VITAMIN D3) 125 MCG (5000 UT) CAPS Take 5,000 Units by mouth daily.     [provider]  FLUoxetine (PROZAC) 10 MG capsule Take 1 capsule (10 mg total) by  mouth daily. 03/29/20   Nelwyn Salisbury, MD  Glucosamine-Chondroitin (OSTEO BI-FLEX REGULAR STRENGTH PO) Take 1 tablet by mouth daily. Includes 2,000 units of vit D daily    [provider]  HYDROcodone-acetaminophen (NORCO) 10-325 MG tablet Take 1 tablet by mouth every 6 (six) hours as needed. 08/09/20 09/08/20  Nelwyn Salisbury, MD  hydrOXYzine (ATARAX/VISTARIL) 50 MG tablet TAKE 1 TABLET BY MOUTH AT BEDTIME. 04/15/20   Nelwyn Salisbury, MD  Magnesium 500 MG CAPS Take 1 capsule by mouth daily.    [provider]  melatonin 3 MG TABS tablet Take 18 mg by mouth at bedtime.     [provider]  Multiple Vitamin (MULTIVITAMIN WITH MINERALS) TABS tablet Take 1 tablet by mouth daily.    [provider]  omeprazole (PRILOSEC) 20 MG capsule Take 20 mg by mouth 2 (two) times daily before a meal.    [provider]  polyethylene glycol (MIRALAX / GLYCOLAX) 17 g packet Take 17 g by mouth daily as needed.    [provider]  potassium chloride (KLOR-CON) 10 MEQ tablet TAKE 1 TABLET BY MOUTH DAILY. 07/19/20   Nelwyn Salisbury, MD  Probiotic Product (PROBIOTIC PO) Take 1 capsule by mouth daily.     [provider]  simvastatin (ZOCOR) 80 MG tablet Take 1 tablet (80 mg total) by mouth at bedtime. 03/23/20   Nelwyn Salisbury, MD  VITAMIN E PO Take by mouth daily. Takes 184mg     [provider]    Allergies    Sulfonamide derivatives, Flexeril [cyclobenzaprine], Hydromorphone, Morphine and related, and Sulfamethoxazole  Review of Systems   Review of Systems  All other systems reviewed and are negative.   Physical Exam Updated Vital Signs BP 126/86 (BP Location: Left Arm)   Pulse 72   Temp 98.6 F (37 C) (Oral)   Resp 14   Ht 1.588 m (5' 2.52")   Wt 71.2 kg   SpO2 100%   BMI 28.23 kg/m   Physical Exam Vitals and nursing note reviewed.  Constitutional:      General: She is not in acute distress.    Appearance: Normal appearance. She is well-developed and  well-nourished. She is not toxic-appearing.  HENT:     Head: Normocephalic and atraumatic.  Eyes:     General: Lids are normal.     Extraocular Movements: EOM normal.     Conjunctiva/sclera: Conjunctivae normal.     Pupils: Pupils are equal, round, and reactive to light.  Neck:     Thyroid: No thyroid mass.     Trachea: No tracheal deviation.  Cardiovascular:     Rate and Rhythm: Normal rate and regular rhythm.     Heart sounds: Normal heart sounds. No murmur heard. No gallop.   Pulmonary:     Effort: Pulmonary effort is normal. No respiratory distress.     Breath sounds: Normal breath sounds. No stridor. No decreased breath sounds, wheezing, rhonchi or rales.  Abdominal:     General: Bowel sounds are normal. There is no distension.     Palpations: Abdomen is soft.     Tenderness: There is no abdominal tenderness. There is no CVA tenderness or rebound.  Musculoskeletal:        General: No tenderness or edema. Normal range of motion.     Cervical back: Normal range of motion and neck supple.  Skin:    General: Skin is warm and dry.     Findings: No  abrasion or rash.  Neurological:     Mental Status: She is alert and oriented to person, place, and time.     GCS: GCS eye subscore is 4. GCS verbal subscore is 5. GCS motor subscore is 6.     Cranial Nerves: No cranial nerve deficit.     Sensory: No sensory deficit.     Deep Tendon Reflexes: Strength normal.  Psychiatric:        Attention and Perception: She is inattentive.        Mood and Affect: Affect is blunt.        Speech: Speech is delayed.        Behavior: Behavior is withdrawn.        Thought Content: Thought content includes suicidal ideation.     ED Results / Procedures / Treatments   Labs (all labs ordered are listed, but only abnormal results are displayed) Labs Reviewed  RESP PANEL BY RT-PCR (FLU A&B, COVID) ARPGX2  ETHANOL  RAPID URINE DRUG SCREEN, HOSP PERFORMED  SALICYLATE LEVEL  ACETAMINOPHEN LEVEL  CBC  WITH DIFFERENTIAL/PLATELET  COMPREHENSIVE METABOLIC PANEL    EKG None  Radiology No results found.  Procedures Procedures   Medications Ordered in ED Medications - No data to display  ED Course  I have reviewed the triage vital signs and the nursing notes.  Pertinent labs & imaging results that were available during my care of the patient were reviewed by me and considered in my medical decision making (see chart for details).    MDM Rules/Calculators/A&P                         8:53 PM  Patiently medically clear for psychiatric disposition Final Clinical Impression(s) / ED Diagnoses Final diagnoses:  None    Rx / DC Orders ED Discharge Orders    None       Lorre Nick, MD 07/28/20 Corky Crafts    Lorre Nick, MD 07/28/20 2053

## 2020-07-28 NOTE — ED Triage Notes (Addendum)
Patient BIB [facility] from home. Patient has history of Bipolar Disorder and has not been on her meds for 3 weeks, this was when patient had Covid. EMS got called out with patient saying she was going to die and was barricading herself into her room. Patient says that she is dead or will die tonight. Alert x2.   EMS gave 5mg  Midazolam IM 2.5mg  Haldol IM EMS placed 20G IV left AC

## 2020-07-28 NOTE — ED Notes (Addendum)
Pt. In burgundy scrubs. Pt. wanded by security. Pt.has 1 belongings bag. Pt. Has 1pr black bed shoes, 1 gray t-shirt, 1blue/white/ red pant. Pt. Has no cell phone, no charger and no purse. Pt. Belongings locked up in cabinet 1-8.

## 2020-07-28 NOTE — BH Assessment (Signed)
Comprehensive Clinical Assessment (CCA) Note  07/28/2020 [name] [name] [id number]  [name] [name]. [name] [name] is a 65 year old female who was transported to [facility] by EMS after her ex-husband became concerned for her wellbeing. Pt reports "I pulled a death on him because he chose not to be with me on the worse night of my life. My balance is all messed up. I thought the Shaune Pollack was coming to get me because he already took my legs".   Per pt's chart, pt has not taken her prescribed medications in the last 3 weeks. Per pt's report, she believes she last took her medications on "Sunday or Saturday". Pt was very disorganized in her thought process while labile in her affect. She would laugh inappropriately during assessment with this Clinical research associate. She reports having auditory hallucinations, stating "I hear swishing sounds and bio-tech sounds". Pt was unable to recall the last time she went to sleep and she was unable to recall when she ate her last meal. Pt denied SI/HI to this Clinical research associate; however, she was preoccupied with the idea of her dying as she repeatedly reference her death and "not being here anymore".  Recommendations for Services/Supports/Treatments: Per Nira Conn, NP patient is recommended for inpatient treatment.  Chief Complaint:  Chief Complaint  Patient presents with  . Suicidal     CCA Screening, Triage and Referral (STR)  Patient Reported Information How did you hear about [place]? Family/Friend  Referral name: [name] [name] (ex-husband)  Referral phone number: No data recorded  Whom do you see for routine medical problems? No data recorded Practice/Facility Name: No data recorded Practice/Facility Phone Number: No data recorded Name of Contact: No data recorded Contact Number: No data recorded Contact Fax Number: No data recorded Prescriber Name: No data recorded Prescriber Address (if known): No data recorded  What Is the Reason for Your Visit/Call Today? No data recorded How Long Has This Been  Causing You Problems? > than 6 months  What Do You Feel Would Help You the Most Today? Medication   Have You Recently Been in Any Inpatient Treatment (Hospital/Detox/Crisis Center/28-Day Program)? No  Name/Location of Program/Hospital:No data recorded How Long Were You There? No data recorded When Were You Discharged? No data recorded  Have You Ever Received Services From [facility] Before? No  Who Do You See at [facility]? No data recorded  Have You Recently Had Any Thoughts About Hurting Yourself? No  Are You Planning to Commit Suicide/Harm Yourself At This time? No   Have you Recently Had Thoughts About Hurting Someone [name] [name]? No  Explanation: No data recorded  Have You Used Any Alcohol or Drugs in the Past 24 Hours? No  How Long Ago Did You Use Drugs or Alcohol? No data recorded What Did You Use and How Much? No data recorded  Do You Currently Have a Therapist/Psychiatrist? Yes  Name of Therapist/Psychiatrist: Dr. Abran Cantor   Have You Been Recently Discharged From Any Office Practice or Programs? No  Explanation of Discharge From Practice/Program: No data recorded    CCA Screening Triage Referral Assessment Type of Contact: Tele-Assessment  Is this Initial or Reassessment? Initial Assessment  Date Telepsych consult ordered in [facility]:  07/28/2020  Time Telepsych consult ordered in [facility]:  2052   Patient Reported Information Reviewed? Yes  Patient Left Without Being Seen? No data recorded Reason for Not Completing Assessment: No data recorded  Collateral Involvement: None   Does Patient Have a Court Appointed Legal Guardian? No data recorded Name and Contact of Legal Guardian:  No data recorded If Minor and Not Living with Parent(s), Who has Custody? No data recorded Is CPS involved or ever been involved? Never  Is APS involved or ever been involved? Never   Patient Determined To Be At Risk for Harm To Self or Others Based on Review of Patient Reported  Information or Presenting Complaint? No  Method: No data recorded Availability of Means: No data recorded Intent: No data recorded Notification Required: No data recorded Additional Information for Danger to Others Potential: No data recorded Additional Comments for Danger to Others Potential: No data recorded Are There Guns or Other Weapons in Your Home? No data recorded Types of Guns/Weapons: No data recorded Are These Weapons Safely Secured?                            No data recorded Who Could Verify You Are Able To Have These Secured: No data recorded Do You Have any Outstanding Charges, Pending Court Dates, Parole/Probation? No data recorded Contacted To Inform of Risk of Harm To Self or Others: No data recorded  Location of Assessment: [facility] ED   Does Patient Present under Involuntary Commitment? No  IVC Papers Initial File Date: No data recorded  [place] of Residence: [place]   Patient Currently Receiving the Following Services: Medication Management   Determination of Need: Emergent (2 hours)   Options For Referral: Medication Management     CCA Biopsychosocial Intake/Chief Complaint:  "I pulled a death on him because he chose not to be with me on the worse night of my life." Pt was picked from her home by EMS and transported to the ED for evaluation. Pt has not taken her prescribed medications for the past 3 weeks.  Current Symptoms/Problems: No data recorded  Patient Reported Schizophrenia/Schizoaffective Diagnosis in Past: No data recorded  Strengths: No data recorded Preferences: No data recorded Abilities: No data recorded  Type of Services Patient Feels are Needed: No data recorded  Initial Clinical Notes/Concerns: No data recorded  Mental Health Symptoms Depression:  None   Duration of Depressive symptoms: No data recorded  Mania:  Change in energy/activity; Euphoria; Increased Energy; Irritability; Overconfidence; Racing thoughts; Recklessness    Anxiety:   None   Psychosis:  Grossly disorganized speech; Hallucinations; Delusions   Duration of Psychotic symptoms: Greater than six months   Trauma:  None   Obsessions:  None   Compulsions:  None   Inattention:  None   Hyperactivity/Impulsivity:  N/A   Oppositional/Defiant Behaviors:  None   Emotional Irregularity:  None   Other Mood/Personality Symptoms:  None    Mental Status Exam Appearance and self-care  Stature:  Average   Weight:  Average weight   Clothing:  Age-appropriate   Grooming:  -- (UTA)   Cosmetic use:  None   Posture/gait:  -- (UTA)   Motor activity:  Slowed   Sensorium  Attention:  Confused   Concentration:  Focuses on irrelevancies; Preoccupied; Scattered   Orientation:  Place   Recall/memory:  Defective in Immediate; Defective in Short-term   Affect and Mood  Affect:  Labile   Mood:  Hypomania   Relating  Eye contact:  Fleeting   Facial expression:  Responsive   Attitude toward examiner:  Cooperative   Thought and Language  Speech flow: Flight of Ideas   Thought content:  Delusions; Ideas of Reference   Preoccupation:  Suicide; Ruminations   Hallucinations:  Auditory   Organization:  No  data recorded  [employer] of Knowledge:  Poor   Intelligence:  Needs investigation   Abstraction:  No data recorded  Judgement:  Impaired   Reality Testing:  Distorted   Insight:  None/zero insight   Decision Making:  Confused   Social Functioning  Social Maturity:  No data recorded  Social Judgement:  No data recorded  Stress  Stressors:  No data recorded  Coping Ability:  No data recorded  Skill Deficits:  No data recorded  Supports:  No data recorded    Religion:    Leisure/Recreation:    Exercise/Diet:     CCA Employment/Education Employment/Work Situation:    Education:     CCA Family/Childhood History Family and Relationship History:    Childhood History:      Child/Adolescent Assessment:     CCA Substance Use Alcohol/Drug Use: Alcohol / Drug Use Pain Medications: See MAR Prescriptions: See MAR Over the Counter: See MAR History of alcohol / drug use?: No history of alcohol / drug abuse         Recommendations for Services/Supports/Treatments: Per Nira Conn, NP patient is recommended for inpatient treatment.    DSM5 Diagnoses: Patient Active Problem List   Diagnosis Date Noted  . Hypokalemia 07/23/2018  . Post concussion syndrome 07/01/2018  . Neck pain 05/06/2017  . Angina pectoris (HCC) 06/06/2016  . PUD (peptic ulcer disease) 06/06/2016  . Attention deficit hyperactivity disorder (ADHD) 02/07/2016  . Obesity (BMI 30-39.9) 11/10/2015  . Balance disorder 03/02/2015  . Bipolar disorder (HCC) 12/13/2009  . MEMORY LOSS 07/01/2009  . BACK PAIN, LUMBAR 01/21/2009  . EDEMA LEG 01/21/2009  . HIDRADENITIS SUPPURATIVA 10/04/2008  . DIZZINESS 04/16/2008  . 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 Anxiety 01/23/2008  . DEPRESSION 01/23/2008  . Essential hypertension 01/23/2008  . Allergic rhinitis 01/23/2008  . GERD 01/23/2008  . INSOMNIA 01/23/2008  . URINARY INCONTINENCE 01/23/2008  . POSITIVE PPD 01/23/2008  . Hyperlipidemia 04/15/2007  . Sleep apnea 04/15/2007  . Headache 04/15/2007    Mamie Nick, Counselor

## 2020-07-29 MED ORDER — HYDROCODONE-ACETAMINOPHEN 10-325 MG PO TABS
1.0000 | ORAL_TABLET | Freq: Once | ORAL | Status: AC
  Administered 2020-07-29: 1 via ORAL
  Filled 2020-07-29: qty 1

## 2020-07-29 MED ORDER — ALPRAZOLAM 0.5 MG PO TABS
2.0000 mg | ORAL_TABLET | Freq: Once | ORAL | Status: AC
  Administered 2020-07-29: 2 mg via ORAL
  Filled 2020-07-29: qty 4

## 2020-07-29 MED ORDER — ALPRAZOLAM 2 MG PO TABS: 2.0000 mg | ORAL_TABLET | Freq: Three times a day (TID) | ORAL | 0 refills | Status: DC | PRN
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
## 2020-07-29 NOTE — Discharge Instructions (Signed)
You were evaluated by psychiatry and cleared to follow-up with your treating team.  Please return to the emergency department for any worsening or concerning symptoms.  For your behavioral health needs, you are advised to continue treatment with your regular outpatient provider.

## 2020-07-29 NOTE — ED Notes (Signed)
Patient up speaking with TTS.

## 2020-07-29 NOTE — BH Assessment (Signed)
[facility] Assessment Progress Note  Per Berneice Heinrich, NP, this voluntary pt does not require psychiatric hospitalization at this time.  Pt is psychiatrically cleared.  Discharge instructions advise pt to continue treatment with her current outpatient provider  EDP Meridee Score, MD and pt's nurse, [facility], have been notified.  Doylene Canning, MA Triage Specialist [phone]

## 2020-08-01 ENCOUNTER — Other Ambulatory Visit: Payer: Self-pay

## 2020-08-02 ENCOUNTER — Other Ambulatory Visit: Payer: Self-pay

## 2020-08-02 ENCOUNTER — Ambulatory Visit (INDEPENDENT_AMBULATORY_CARE_PROVIDER_SITE_OTHER): Payer: PPO | Admitting: Family Medicine

## 2020-08-02 ENCOUNTER — Encounter: Payer: Self-pay | Admitting: Family Medicine

## 2020-08-02 VITALS — BP 118/72 | HR 102 | Temp 97.7°F | Ht 62.5 in | Wt 162.0 lb

## 2020-08-02 DIAGNOSIS — E876 Hypokalemia: Secondary | ICD-10-CM

## 2020-08-02 DIAGNOSIS — F312 Bipolar disorder, current episode manic severe with psychotic features: Secondary | ICD-10-CM | POA: Diagnosis not present

## 2020-08-02 DIAGNOSIS — F900 Attention-deficit hyperactivity disorder, predominantly inattentive type: Secondary | ICD-10-CM | POA: Diagnosis not present

## 2020-08-02 MED ORDER — DIAZEPAM 10 MG PO TABS: 10.0000 mg | ORAL_TABLET | Freq: Two times a day (BID) | ORAL | 2 refills | Status: DC | PRN

## 2020-08-02 MED ORDER — AMPHETAMINE-DEXTROAMPHETAMINE 30 MG PO TABS: 30.0000 mg | ORAL_TABLET | Freq: Every day | ORAL | 0 refills | Status: DC

## 2020-08-02 NOTE — Progress Notes (Signed)
   Subjective:    Patient ID: [name] [name], female    DOB: [birth year], 65 y.o.   MRN: [id number]  HPI Here with her husband, [name], to follow up an ED visit on 07-28-20. Her husband had left to be on his own about 6 months ago, and [name] had invited her sister to live with her. John had taken care of her medications and basically organized her life, and while he was gone she began to skip her medications. Then she claims her sister took her medications, and she went without them for 3 weeks. She then became psychotic and began to hallucinate. At the ER her exam was otherwise  Normal. Her labs were notable for a potassium of 2.8. She was given a small supply of Xanax and sent home. Since then she and her husband have reconciled and he is living with her again. She made her sister leave. She is now back on a regular schedule of medications and she feels better. However she asks ot change from Xanax to Valium because she says the Xanax no longer helps her anxiety like it use to. She also asks to increase the dose of Adderall (she has been taking 20 mg each morning).    Review of Systems  Constitutional: Negative.   Respiratory: Negative.   Cardiovascular: Negative.   Neurological: Negative.   Psychiatric/Behavioral: Positive for confusion and hallucinations. The patient is nervous/anxious.        Objective:   Physical Exam Constitutional:      Appearance: Normal appearance.  Cardiovascular:     Rate and Rhythm: Normal rate and regular rhythm.     Pulses: Normal pulses.     Heart sounds: Normal heart sounds.  Pulmonary:     Effort: Pulmonary effort is normal.     Breath sounds: Normal breath sounds.  Neurological:     General: No focal deficit present.     Mental Status: She is alert. Mental status is at baseline.  Psychiatric:        Mood and Affect: Mood normal.        Behavior: Behavior normal.        Thought Content: Thought content normal.        Judgment: Judgment normal.            Assessment & Plan:  For the ADHD, we will increase Adderall to 30 mg each morning. For the bipolar disorder we will stay on Prozac, but we will stop Xanax and try Valium 10 mg BID. For the hypokalemia, she is back on potassium 10 mEq daily. Recheck in 2 weeks . Gershon Crane, MD

## 2020-08-15 ENCOUNTER — Telehealth: Payer: Self-pay | Admitting: Family Medicine

## 2020-08-15 NOTE — Telephone Encounter (Signed)
Patient notified VIA phone that RX was sent 08/09/20.  She will call the pharmacy.  Dm/cma

## 2020-08-15 NOTE — Telephone Encounter (Signed)
  HYDROcodone-acetaminophen [facility]) 10-325 MG tablet  [address] [place], [place] - [address] Phone:  [phone]  Fax:  [phone]

## 2020-08-16 ENCOUNTER — Ambulatory Visit (INDEPENDENT_AMBULATORY_CARE_PROVIDER_SITE_OTHER): Payer: PPO | Admitting: Family Medicine

## 2020-08-16 ENCOUNTER — Other Ambulatory Visit: Payer: Self-pay

## 2020-08-16 ENCOUNTER — Encounter: Payer: Self-pay | Admitting: Family Medicine

## 2020-08-16 VITALS — BP 108/68 | HR 75 | Temp 98.3°F | Wt 161.0 lb

## 2020-08-16 DIAGNOSIS — F312 Bipolar disorder, current episode manic severe with psychotic features: Secondary | ICD-10-CM | POA: Diagnosis not present

## 2020-08-16 DIAGNOSIS — F419 Anxiety disorder, unspecified: Secondary | ICD-10-CM

## 2020-08-16 DIAGNOSIS — F900 Attention-deficit hyperactivity disorder, predominantly inattentive type: Secondary | ICD-10-CM

## 2020-08-16 MED ORDER — ALPRAZOLAM 2 MG PO TABS: 2.0000 mg | ORAL_TABLET | Freq: Three times a day (TID) | ORAL | 5 refills | Status: DC | PRN

## 2020-08-16 MED ORDER — FLUOXETINE HCL 10 MG PO CAPS: 10.0000 mg | ORAL_CAPSULE | Freq: Every day | ORAL | 3 refills | Status: DC

## 2020-08-16 NOTE — Progress Notes (Signed)
   Subjective:    Patient ID: [name] [name], female    DOB: 08/06/1955, 65 y.o.   MRN: [id number]  HPI Here to follow up after our visit 2 weeks ago. We had increased her dose of Adderall, and this has been helpful. She also wanted to switch from Xanax to valium, but this id not go well. Her anxiety became much worse and she wants to go back on Xanax. The Prozac seems to be doing well. She is sleeping well and her appetite is good.    Review of Systems  Constitutional: Negative.   Respiratory: Negative.   Cardiovascular: Negative.   Psychiatric/Behavioral: Negative for agitation, behavioral problems, confusion and dysphoric mood. The patient is nervous/anxious.        Objective:   Physical Exam Constitutional:      Appearance: Normal appearance.  Cardiovascular:     Rate and Rhythm: Normal rate and regular rhythm.     Pulses: Normal pulses.     Heart sounds: Normal heart sounds.  Pulmonary:     Effort: Pulmonary effort is normal.     Breath sounds: Normal breath sounds.  Neurological:     Mental Status: She is alert.  Psychiatric:        Mood and Affect: Mood normal.        Behavior: Behavior normal.        Thought Content: Thought content normal.        Judgment: Judgment normal.           Assessment & Plan:  Her bipolar disorder is now under much better control. Stay on Prozac 10 mg daily. For the anxiety we will stop Valium and get back on Xanax 2 mg TID as needed. Her ADHD is better controlled on the new dose of Adderall.  Gershon Crane, MD

## 2020-08-23 DIAGNOSIS — Z1272 Encounter for screening for malignant neoplasm of vagina: Secondary | ICD-10-CM | POA: Diagnosis not present

## 2020-08-23 DIAGNOSIS — Z6829 Body mass index (BMI) 29.0-29.9, adult: Secondary | ICD-10-CM | POA: Diagnosis not present

## 2020-08-23 DIAGNOSIS — Z90710 Acquired absence of both cervix and uterus: Secondary | ICD-10-CM | POA: Diagnosis not present

## 2020-08-23 DIAGNOSIS — Z124 Encounter for screening for malignant neoplasm of cervix: Secondary | ICD-10-CM | POA: Diagnosis not present

## 2020-08-30 ENCOUNTER — Other Ambulatory Visit: Payer: Self-pay | Admitting: Family Medicine

## 2020-08-31 NOTE — Telephone Encounter (Signed)
Last refill- 08/02/2020 Last office visit- 08/16/2020  No future appointment scheduled

## 2020-09-02 MED ORDER — AMPHETAMINE-DEXTROAMPHETAMINE 30 MG PO TABS
30.0000 mg | ORAL_TABLET | Freq: Every day | ORAL | 0 refills | Status: DC
Start: 2020-10-03 — End: 2020-09-02

## 2020-09-02 MED ORDER — AMPHETAMINE-DEXTROAMPHETAMINE 30 MG PO TABS: 30.0000 mg | ORAL_TABLET | Freq: Every day | ORAL | 0 refills | Status: DC

## 2020-09-02 NOTE — Telephone Encounter (Signed)
Done

## 2020-09-14 ENCOUNTER — Other Ambulatory Visit: Payer: Self-pay | Admitting: Family Medicine

## 2020-09-14 DIAGNOSIS — Z1231 Encounter for screening mammogram for malignant neoplasm of breast: Secondary | ICD-10-CM | POA: Diagnosis not present

## 2020-09-14 DIAGNOSIS — N958 Other specified menopausal and perimenopausal disorders: Secondary | ICD-10-CM | POA: Diagnosis not present

## 2020-09-14 NOTE — Telephone Encounter (Signed)
Last office visit-08/16/20  Last refill- 06/03/2019--not original prescribing provider.  No future visit scheduled

## 2020-09-19 ENCOUNTER — Other Ambulatory Visit: Payer: Self-pay | Admitting: Family Medicine

## 2020-09-20 ENCOUNTER — Telehealth: Payer: Self-pay | Admitting: Pharmacist

## 2020-09-20 NOTE — Progress Notes (Signed)
Chronic Care Management Pharmacy Note  10/04/2020 Name:  [name] [name] MRN:  [id number] DOB:  09-Apr-1956  Subjective: [name] [name] is an 65 y.o. year old female who is a primary patient of Laurey Morale, MD.  The CCM team was consulted for assistance with disease management and care coordination needs.    Engaged with patient by telephone for follow up visit in response to provider referral for pharmacy case management and/or care coordination services.   Consent to Services:  The patient was given information about Chronic Care Management services, agreed to services, and gave verbal consent prior to initiation of services.  Please see initial visit note for detailed documentation.   Patient Care Team: Laurey Morale, MD as PCP - General Sueanne Margarita, MD as Consulting Physician (Cardiology) Viona Gilmore, [facility] as Pharmacist (Pharmacist)  Recent office visits: 08/16/20 Alysia Penna, MD: Patient presented for bipolar follow up. Stopped Valium and restarted Xanax 2 mg three times daily as needed.  08/02/20 Alysia Penna, MD: Patient presented for hospitalization follow up. Increased Adderall to 30 mg in the morning. Plan to recheck potassium in 2 weeks.  07/20/20 Charlott Rakes, LPN: Patient presented for AWV.   06/15/20 Carolann Littler, MD: Patient presented for video visit for URI symptoms. Recommended for her to get COVID tested as soon as possible.  06/08/20 Alysia Penna, MD: Patient presented for video visit for pain management. Refilled medications.  03/17/20 Alysia Penna, MD: Patient presented for annual exam. All labs completed WNL except lipid panel. LDL was 124, decreased from 149 two years ago. No changes made.   Recent consult visits: 08/23/20 Linda Hedges (OBGYN): Patient presented for follow up.  04/04/20 My Margret Chance (optometry): Unable to access notes.  Hospital visits: 07/28/20 Patient presented to the ED for suicidal thoughts.  Objective:  Lab Results   Component Value Date   CREATININE 0.75 07/28/2020   BUN 15 07/28/2020   GFR 94.56 03/10/2018   GFRNONAA >60 07/28/2020   GFRAA >60 03/23/2019   NA 140 07/28/2020   K 2.8 (L) 07/28/2020   CALCIUM 9.9 07/28/2020   CO2 27 07/28/2020   GLUCOSE 106 (H) 07/28/2020    Lab Results  Component Value Date/Time   HGBA1C 5.5 03/17/2020 11:52 AM   HGBA1C 6.0 03/10/2018 10:35 AM   GFR 94.56 03/10/2018 10:35 AM   GFR 103.89 10/23/2016 02:04 PM    Last diabetic Eye exam: No results found for: HMDIABEYEEXA  Last diabetic Foot exam: No results found for: HMDIABFOOTEX   Lab Results  Component Value Date   CHOL 202 (H) 03/17/2020   HDL 56 03/17/2020   LDLCALC 124 (H) 03/17/2020   LDLDIRECT 146.0 03/10/2018   TRIG 109 03/17/2020   CHOLHDL 3.6 03/17/2020    Hepatic Function Latest Ref Rng & Units 07/28/2020 03/17/2020 03/10/2018  Total Protein 6.5 - 8.1 g/dL 8.4(H) 7.3 7.8  Albumin 3.5 - 5.0 g/dL 4.5 - 4.5  AST 15 - 41 U/L 58(H) 27 11  ALT 0 - 44 U/L $Remo'24 14 13  'xkSnt$ Alk Phosphatase 38 - 126 U/L 65 - 64  Total Bilirubin 0.3 - 1.2 mg/dL 0.9 0.5 0.6  Bilirubin, Direct 0.0 - 0.2 mg/dL - 0.1 0.1    Lab Results  Component Value Date/Time   TSH 0.98 03/17/2020 11:52 AM   TSH 1.03 03/10/2018 10:35 AM    CBC Latest Ref Rng & Units 07/28/2020 03/17/2020 03/23/2019  WBC 4.0 - 10.5 K/uL 11.2(H) 5.4 6.0  Hemoglobin 12.0 -  15.0 g/dL 12.9 12.8 12.6  Hematocrit 36.0 - 46.0 % 38.6 38.3 37.2  Platelets 150 - 400 K/uL 211 184 157    Lab Results  Component Value Date/Time   VD25OH 54 03/17/2020 11:52 AM   VD25OH 52 04/20/2013 02:59 PM    Clinical ASCVD: No  The 10-year ASCVD risk score Mikey Bussing DC Jr., et al., 2013) is: 9%   Values used to calculate the score:     Age: 21 years     Sex: Female     Is Non-Hispanic African American: No     Diabetic: No     Tobacco smoker: Yes     Systolic Blood Pressure: 201 mmHg     Is BP treated: No     HDL Cholesterol: 56 mg/dL     Total Cholesterol: 202 mg/dL     Depression screen [facility] 2/9 07/20/2020 06/08/2020 07/14/2019  Decreased Interest 0 3 0  Down, Depressed, Hopeless 0 3 1  PHQ - 2 Score 0 6 1  Altered sleeping - 1 -  Tired, decreased energy - 3 -  Change in appetite - 3 -  Feeling bad or failure about yourself  - 3 -  Trouble concentrating - 2 -  Moving slowly or fidgety/restless - 1 -  Suicidal thoughts - 1 -  PHQ-9 Score - 20 -  Difficult doing work/chores - Very difficult -  Some recent data might be hidden      Social History   Tobacco Use  Smoking Status Light Tobacco Smoker  . Years: 30.00  . Types: Cigarettes  Smokeless Tobacco Never Used  Tobacco Comment   " 1 cigarette every 2 days- maybe "   BP Readings from Last 3 Encounters:  08/16/20 108/68  08/02/20 118/72  07/29/20 127/74   Pulse Readings from Last 3 Encounters:  08/16/20 75  08/02/20 (!) 102  07/29/20 87   Wt Readings from Last 3 Encounters:  08/16/20 161 lb (73 kg)  08/02/20 162 lb (73.5 kg)  07/28/20 156 lb 15.5 oz (71.2 kg)   BMI Readings from Last 3 Encounters:  08/16/20 28.98 kg/m  08/02/20 29.16 kg/m  07/28/20 28.23 kg/m    Assessment/Interventions: Review of patient past medical history, allergies, medications, health status, including review of consultants reports, laboratory and other test data, was performed as part of comprehensive evaluation and provision of chronic care management services.   SDOH:  (Social Determinants of Health) assessments and interventions performed: No  SDOH Screenings   Alcohol Screen: Not on file  Depression (PHQ2-9): Low Risk   . PHQ-2 Score: 0  Financial Resource Strain: Low Risk   . Difficulty of Paying Living Expenses: Not hard at all  Food Insecurity: No Food Insecurity  . Worried About Charity fundraiser in the Last Year: Never true  . Ran Out of Food in the Last Year: Never true  Housing: Low Risk   . Last Housing Risk Score: 0  Physical Activity: Inactive  . Days of Exercise per Week: 0  days  . Minutes of Exercise per Session: 0 min  Social Connections: Moderately Isolated  . Frequency of Communication with Friends and Family: More than three times a week  . Frequency of Social Gatherings with Friends and Family: Never  . Attends Religious Services: More than 4 times per year  . Active Member of Clubs or Organizations: No  . Attends Archivist Meetings: Never  . Marital Status: Separated  Stress: No Stress Concern Present  .  Feeling of Stress : Not at all  Tobacco Use: High Risk  . Smoking Tobacco Use: Light Tobacco Smoker  . Smokeless Tobacco Use: Never Used  Transportation Needs: No Transportation Needs  . Lack of Transportation (Medical): No  . Lack of Transportation (Non-Medical): No    CCM Care Plan  Allergies  Allergen Reactions  . Sulfonamide Derivatives Swelling    Facial swelling  . Flexeril [Cyclobenzaprine] Other (See Comments)    Dizziness, made meniere's disease worse   . Hydromorphone Itching  . Morphine And Related Nausea And Vomiting  . Sulfamethoxazole Other (See Comments)    Causes swelling     Medications Reviewed Today    Reviewed by Laurey Morale, MD (Physician) on 08/16/20 at 1407  Med List Status: <None>  Medication Order Taking? Sig Documenting Provider Last Dose Status Informant  acyclovir (ZOVIRAX) 200 MG capsule [id number] Yes Take 2 capsules (400 mg total) by mouth daily in the afternoon. Laurey Morale, MD Taking Active Multiple Informants  amphetamine-dextroamphetamine (ADDERALL) 30 MG tablet [id number] Yes Take 1 tablet by mouth daily before breakfast. Laurey Morale, MD Taking Active   Ascorbic Acid (VITAMIN C) 1000 MG tablet [id number] Yes Take 1,000 mg by mouth daily. [provider] Taking Active Multiple Informants  azelastine (ASTELIN) 0.1 % nasal spray [id number] Yes PLACE 2 SPRAYS INTO BOTH NOSTRILS 2 TIMES DAILY AS DIRECTED  Patient taking differently: Place 1 spray into both nostrils daily.   Laurey Morale, MD Taking Active   b complex vitamins capsule [id number] Yes Take 1 capsule by mouth daily. [provider] Taking Active Multiple Informants  Cholecalciferol (VITAMIN D3) 125 MCG (5000 UT) CAPS [id number] Yes Take 5,000 Units by mouth once a week. [provider] Taking Active Multiple Informants  diazepam (VALIUM) 10 MG tablet [id number] Yes Take 1 tablet (10 mg total) by mouth every 12 (twelve) hours as needed for anxiety. Laurey Morale, MD Taking Active   Echinacea 400 MG CAPS [id number] Yes Take by mouth. [provider] Taking Active   FLUoxetine (PROZAC) 10 MG capsule [id number] Yes Take 1 capsule (10 mg total) by mouth daily. Laurey Morale, MD Taking Active Multiple Informants  Glucosamine-Chondroitin (OSTEO BI-FLEX REGULAR STRENGTH PO) [id number] Yes Take 1 tablet by mouth daily. [provider] Taking Active Multiple Informants  HYDROcodone-acetaminophen (NORCO) 10-325 MG tablet [id number] Yes Take 1 tablet by mouth every 6 (six) hours as needed.  Patient taking differently: Take 1 tablet by mouth every 6 (six) hours as needed for moderate pain.   Laurey Morale, MD Taking Active   hydrOXYzine (ATARAX/VISTARIL) 50 MG tablet [id number] Yes TAKE 1 TABLET BY MOUTH AT BEDTIME.  Patient taking differently: Take 50 mg by mouth daily.   Laurey Morale, MD Taking Active   Magnesium 500 MG CAPS [id number] Yes Take 500 mg by mouth daily. [provider] Taking Active Multiple Informants  melatonin 3 MG TABS tablet [id number] Yes Take 3 mg by mouth at bedtime. [provider] Taking Active Multiple Informants  Multiple Vitamin (MULTIVITAMIN WITH MINERALS) TABS tablet [id number] Yes Take 2 tablets by mouth daily. [provider] Taking Active Multiple Informants  omeprazole (PRILOSEC) 20 MG capsule [id number] Yes Take 20 mg by mouth 2 (two) times daily before a meal. [provider] Taking Active Multiple Informants  polyethylene  glycol (MIRALAX / GLYCOLAX) 17 g packet [id number] Yes Take 17 g by mouth daily as needed. [provider] Taking Active Multiple Informants  potassium  chloride (KLOR-CON) 10 MEQ tablet [id number] Yes TAKE 1 TABLET BY MOUTH DAILY.  Patient taking differently: Take by mouth daily.   Laurey Morale, MD Taking Active   Probiotic Product (PROBIOTIC PO) [id number] Yes Take 1 capsule by mouth daily.  [provider] Taking Active Multiple Informants  simvastatin (ZOCOR) 80 MG tablet [id number] Yes Take 1 tablet (80 mg total) by mouth at bedtime. Laurey Morale, MD Taking Active Multiple Informants  VITAMIN E PO [id number] Yes Take 1 tablet by mouth once a week. [provider] Taking Active Multiple Informants          Patient Active Problem List   Diagnosis Date Noted  . Bipolar affective disorder, current episode manic with psychotic symptoms ([place])   . Hypokalemia 07/23/2018  . Post concussion syndrome 07/01/2018  . Neck pain 05/06/2017  . Angina pectoris ([place]) 06/06/2016  . PUD (peptic ulcer disease) 06/06/2016  . Attention deficit hyperactivity disorder (ADHD) 02/07/2016  . Obesity (BMI 30-39.9) 11/10/2015  . Balance disorder 03/02/2015  . Bipolar disorder ([place]) 12/13/2009  . MEMORY LOSS 07/01/2009  . BACK PAIN, LUMBAR 01/21/2009  . EDEMA LEG 01/21/2009  . HIDRADENITIS SUPPURATIVA 10/04/2008  . DIZZINESS 04/16/2008  . 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 Anxiety 01/23/2008  . DEPRESSION 01/23/2008  . Essential hypertension 01/23/2008  . Allergic rhinitis 01/23/2008  . GERD 01/23/2008  . INSOMNIA 01/23/2008  . URINARY INCONTINENCE 01/23/2008  . POSITIVE PPD 01/23/2008  . Hyperlipidemia 04/15/2007  . Sleep apnea 04/15/2007  . Headache 04/15/2007    Immunization History  Administered Date(s) Administered  . Influenza Split 04/26/2011  . Influenza Whole 03/23/2010  . Influenza,inj,Quad PF,6+ Mos 03/20/2013, 03/02/2015, 03/26/2016, 03/25/2017, 05/08/2019,  03/17/2020  . Influenza-Unspecified 05/26/2014, 03/03/2018  . Pneumococcal Conjugate-13 04/19/2016  . Pneumococcal Polysaccharide-23 03/07/2018  . Tdap 04/20/2013  . Zoster 03/26/2016  . Zoster Recombinat (Shingrix) 02/18/2019   Patient reports she has been doing better within the last few weeks but still not feeling well. She had stopped taking all of her medications and ended up in the hospital and is still recovering from that. Her sister had moved in with her and was stealing her medications which was part of the problem.  Her husband has since returned home and is home all the time and he is helping her manage her medications. Her  She is stressed and upset right now because her oldest sister is in the hospital dying with COVID and has been there for almost 2 months.   Patient also reports that she feels like she is losing her memory and missing large chunks of memory during the day. Recommended follow up with neurology and will request for a referral to be placed.   Conditions to be addressed/monitored:  Hypertension, Hyperlipidemia, GERD, Depression, Anxiety, Allergic Rhinitis and ADHD and back pain  Care Plan : CCM Pharmacy Care Plan  Updates made by Viona Gilmore, [place] since 10/04/2020 12:00 AM    Problem: Problem: Hypertension, Hyperlipidemia, GERD, Depression, Anxiety, Allergic Rhinitis and ADHD and back pain     Long-Range Goal: Patient-Specific Goal   Start Date: 09/21/2020  Expected End Date: 09/21/2021  This Visit's Progress: On track  Priority: High  Note:   Current Barriers:  . Unable to independently monitor therapeutic efficacy . Unable to achieve control of cholesterol  . Unable to self administer medications as prescribed   Pharmacist Clinical Goal(s):  [name] Patient will achieve adherence to monitoring guidelines and medication adherence to achieve therapeutic efficacy . achieve  control of cholesterol as evidenced by next lipid panel  through collaboration with  PharmD and provider.   Interventions: . 1:1 collaboration with Laurey Morale, MD regarding development and update of comprehensive plan of care as evidenced by provider attestation and co-signature . Inter-disciplinary care team collaboration (see longitudinal plan of care) . Comprehensive medication review performed; medication list updated in electronic medical record  Hypertension (BP goal <140/90) -Controlled -Current treatment: . No medications -Medications previously tried: n/a  -Current home readings: does not check at home -Current dietary habits: been eating more fruit and vegetables and not eating out at all -Current exercise habits: would like to start walking and recommended trying yoga on youtube to improve balance -Denies hypotensive/hypertensive symptoms -Educated on Daily salt intake goal < 2300 mg; Exercise goal of 150 minutes per week; -Counseled to monitor BP at home as directed, document, and provide log at future appointments -Counseled on diet and exercise extensively  Hyperlipidemia: (LDL goal < 100) -Uncontrolled -Current treatment: . simvastatin 80mg , 1 tablet once daily  -Medications previously tried: none  -Current dietary patterns: doing much better with diet; not eating out at all and eating lots of fruits. Seafood and stir fry vegetables -Current exercise habits: no consistent exercise -Educated on Cholesterol goals;  Benefits of statin for ASCVD risk reduction; Importance of limiting foods high in cholesterol; Exercise goal of 150 minutes per week; -Counseled on diet and exercise extensively Recommended to switch to high intensity statin therapy  Depression/Anxiety/bipolar disorder (Goal: minimize symptoms) -Not ideally controlled -Current treatment:  fluoxetine 10mg , 2 tablets once daily  alprazolam 2mg , 1 tablet three times daily as needed (patient reports taking about 2 tablets/day) -Medications previously tried/failed: Seroquel,  Lithium -PHQ9: 0 -GAD7: n/a -Connected with [place] phone number [phone]) for mental health support -Educated on Benefits of medication for symptom control Benefits of cognitive-behavioral therapy with or without medication -Counseled on spacing out hydrocodone and alprazolam to prevent excessive sedation and accidental overdose Recommended calling behavioral health to set up an appointment  Insomnia(Goal: improve quality and quantity of sleep) -Controlled -Current treatment   Inspire device   Melatonin 10 mg liquid at bedtime   Hydroxyzine 50 mg 1 tablet at bedtime as needed -Medications previously tried: Seroquel, Zzquil  -Counseled on maximum of 10 mg per night of melatonin  Allergic rhinitis (Goal: minimize symptoms) -Controlled -Current treatment  . azelastine (Astelin) 0.1% nasal spray, 2 sprays into both nostrils twice daily as directed -Medications previously tried: none  -Recommended to continue current medication  Arthritis pain (Goal: minimize pain) -Controlled -Current treatment  . Osteo Bi-flex (glucosamine condroitin & vitamin D) 2000 units daily -Medications previously tried: none  -Counseled on giving the medicine a few weeks trial before deciding if it is helping; discussed stopping vitamin D supplementation with this as the replacement  GERD (Goal: minimize symptoms) -Controlled -Current treatment  . Omeprazole 20 mg 1 capsule twice daily -Medications previously tried: none  -Recommended to continue current medication  Low potassium (Goal: 3.5-5) -Uncontrolled -Current treatment  . Potassium chloride 10 mEq 1 tablet daily -Medications previously tried: none  -Recommended repeat potassium level  Low back pain (Goal: minimize pain) -Not ideally controlled -Current treatment  . Hydrocodone/ acetaminophen (Norco) 10/325mg , 1 tablet every six hours as needed for moderate pain (patient reports usage depends on activities of the  day; some days she may take every six hours, other days she might not at all - no more than 2 per day)  -Medications previously  tried: none  -Counseled on separating from other sedating medications  Health Maintenance -Vaccine gaps: second dose of shingrix, COVID vaccine -Current therapy:  [name] Vitamin E 1 tablet once a week . Magnesium 500 mg daily . Vitamin B complex 1 capsule daily . Multivitamin 1 tablet daily  Acyclovir 400 mg 1 tablet four times daily - patient only takes once daily -Educated on Cost vs benefit of each product must be carefully weighed by individual consumer -Patient is satisfied with current therapy and denies issues -Recommended to continue current medication  Patient Goals/Self-Care Activities . Patient will:  - take medications as prescribed focus on medication adherence by filling out weekly pill box and marking calendar for doses taken target a minimum of 150 minutes of moderate intensity exercise weekly  Follow Up Plan: Telephone follow up appointment with care management team member scheduled for: 3 months       Medication Assistance: None required.  Patient affirms current coverage meets needs.  Patient's preferred pharmacy is:  [facility] [address] [place] [address] Phone: [phone] Fax: [phone]  Uses pill box? Yes Pt endorses 80% compliance - husband is filling the pillbox and they are using the calendar to mark the days she has taken her medications  We discussed: Benefits of medication synchronization, packaging and delivery as well as enhanced pharmacist oversight with [employer]. Patient decided to: Continue current medication management strategy  Care Plan and Follow Up Patient Decision:  Patient agrees to Care Plan and Follow-up.  Plan: Telephone follow up appointment with care management team member scheduled for:  3 months  Jeni Salles, PharmD [place]  Pharmacist [facility] [place] [phone]

## 2020-09-20 NOTE — Chronic Care Management (AMB) (Signed)
I left the patient a message about her upcoming appointment on 09/21/2020 @ 11:00 am  with the clinical pharmacist. She was asked to please have all medication on hand to review with the pharmacist.   Tresa Garter) [place] Levels, [facility] Concierge [phone]

## 2020-09-20 NOTE — Telephone Encounter (Signed)
Last office visit- 08/02/2020 Medication currently not on med list  No future visit scheduled

## 2020-09-21 ENCOUNTER — Ambulatory Visit (INDEPENDENT_AMBULATORY_CARE_PROVIDER_SITE_OTHER): Payer: PPO | Admitting: Pharmacist

## 2020-09-21 DIAGNOSIS — I10 Essential (primary) hypertension: Secondary | ICD-10-CM

## 2020-09-21 DIAGNOSIS — E785 Hyperlipidemia, unspecified: Secondary | ICD-10-CM

## 2020-10-04 NOTE — Patient Instructions (Addendum)
Hi [name] [name],  It was great to get to speak with you again! Keep working on making some of those diet changes that we discussed and adding exercise back in your daily routine such as yoga, which could really help with your balance. Also, don't forget to call the behavioral health line at [phone] to see if they can help in any way. Below is a summary of the rest of the topics we discussed.   Please reach out to me if you have any questions or need anything before our follow up!  Best, [name]  Gaylord Shih, PharmD, [facility] Clinical Pharmacist [facility] [place] [phone]  Visit Information  Goals Addressed   None    Patient Care Plan: CCM Pharmacy Care Plan    Problem Identified: Problem: Hypertension, Hyperlipidemia, GERD, Depression, Anxiety, Allergic Rhinitis and ADHD and back pain     Long-Range Goal: Patient-Specific Goal   Start Date: 09/21/2020  Expected End Date: 09/21/2021  This Visit's Progress: On track  Priority: High  Note:   Current Barriers:  . Unable to independently monitor therapeutic efficacy . Unable to achieve control of cholesterol  . Unable to self administer medications as prescribed   Pharmacist Clinical Goal(s):  [name] Patient will achieve adherence to monitoring guidelines and medication adherence to achieve therapeutic efficacy . achieve control of cholesterol as evidenced by next lipid panel  through collaboration with PharmD and provider.   Interventions: . 1:1 collaboration with Nelwyn Salisbury, MD regarding development and update of comprehensive plan of care as evidenced by provider attestation and co-signature . Inter-disciplinary care team collaboration (see longitudinal plan of care) . Comprehensive medication review performed; medication list updated in electronic medical record  Hypertension (BP goal <140/90) -Controlled -Current treatment: . No medications -Medications previously tried: n/a  -Current home readings: does not  check at home -Current dietary habits: been eating more fruit and vegetables and not eating out at all -Current exercise habits: would like to start walking and recommended trying yoga on youtube to improve balance -Denies hypotensive/hypertensive symptoms -Educated on Daily salt intake goal < 2300 mg; Exercise goal of 150 minutes per week; -Counseled to monitor BP at home as directed, document, and provide log at future appointments -Counseled on diet and exercise extensively  Hyperlipidemia: (LDL goal < 100) -Uncontrolled -Current treatment: . simvastatin 80mg , 1 tablet once daily  -Medications previously tried: none  -Current dietary patterns: doing much better with diet; not eating out at all and eating lots of fruits. Seafood and stir fry vegetables -Current exercise habits: no consistent exercise -Educated on Cholesterol goals;  Benefits of statin for ASCVD risk reduction; Importance of limiting foods high in cholesterol; Exercise goal of 150 minutes per week; -Counseled on diet and exercise extensively Recommended to switch to high intensity statin therapy  Depression/Anxiety/bipolar disorder (Goal: minimize symptoms) -Not ideally controlled -Current treatment:  fluoxetine 10mg , 2 tablets once daily  alprazolam 2mg , 1 tablet three times daily as needed (patient reports taking about 2 tablets/day) -Medications previously tried/failed: Seroquel, Lithium -PHQ9: 0 -GAD7: n/a -Connected with Health phone number [phone]) for mental health support -Educated on Benefits of medication for symptom control Benefits of cognitive-behavioral therapy with or without medication -Counseled on spacing out hydrocodone and alprazolam to prevent excessive sedation and accidental overdose Recommended calling behavioral health to set up an appointment  Insomnia(Goal: improve quality and quantity of sleep) -Controlled -Current treatment   Inspire device    Melatonin 10 mg liquid at bedtime   Hydroxyzine  50 mg 1 tablet at bedtime as needed -Medications previously tried: Seroquel, Zzquil  -Counseled on maximum of 10 mg per night of melatonin  Allergic rhinitis (Goal: minimize symptoms) -Controlled -Current treatment  . azelastine (Astelin) 0.1% nasal spray, 2 sprays into both nostrils twice daily as directed -Medications previously tried: none  -Recommended to continue current medication  Arthritis pain (Goal: minimize pain) -Controlled -Current treatment  . Osteo Bi-flex (glucosamine condroitin & vitamin D) 2000 units daily -Medications previously tried: none  -Counseled on giving the medicine a few weeks trial before deciding if it is helping; discussed stopping vitamin D supplementation with this as the replacement  GERD (Goal: minimize symptoms) -Controlled -Current treatment  . Omeprazole 20 mg 1 capsule twice daily -Medications previously tried: none  -Recommended to continue current medication  Low potassium (Goal: 3.5-5) -Uncontrolled -Current treatment  . Potassium chloride 10 mEq 1 tablet daily -Medications previously tried: none  -Recommended repeat potassium level  Low back pain (Goal: minimize pain) -Not ideally controlled -Current treatment  . Hydrocodone/ acetaminophen (Norco) 10/325mg , 1 tablet every six hours as needed for moderate pain (patient reports usage depends on activities of the day; some days she may take every six hours, other days she might not at all - no more than 2 per day)  -Medications previously tried: none  -Counseled on separating from other sedating medications  Health Maintenance -Vaccine gaps: second dose of shingrix, COVID vaccine -Current therapy:  [name] Vitamin E 1 tablet once a week . Magnesium 500 mg daily . Vitamin B complex 1 capsule daily . Multivitamin 1 tablet daily  Acyclovir 400 mg 1 tablet four times daily - patient only takes once daily -Educated on Cost vs benefit of  each product must be carefully weighed by individual consumer -Patient is satisfied with current therapy and denies issues -Recommended to continue current medication  Patient Goals/Self-Care Activities . Patient will:  - take medications as prescribed focus on medication adherence by filling out weekly pill box and marking calendar for doses taken target a minimum of 150 minutes of moderate intensity exercise weekly  Follow Up Plan: Telephone follow up appointment with care management team member scheduled for: 3 months       Patient verbalizes understanding of instructions provided today and agrees to view in MyChart.  Telephone follow up appointment with pharmacy team member scheduled for: 3 months  Verner Chol, [facility]  High Cholesterol  High cholesterol is a condition in which the blood has high levels of a white, waxy substance similar to fat (cholesterol). The liver makes all the cholesterol that the body needs. The human body needs small amounts of cholesterol to help build cells. A person gets extra or excess cholesterol from the food that he or she eats. The blood carries cholesterol from the liver to the rest of the body. If you have high cholesterol, deposits (plaques) may build up on the walls of your arteries. Arteries are the blood vessels that carry blood away from your heart. These plaques make the arteries narrow and stiff. Cholesterol plaques increase your risk for heart attack and stroke. Work with your health care provider to keep your cholesterol levels in a healthy range. What increases the risk? The following factors may make you more likely to develop this condition:  Eating foods that are high in animal fat (saturated fat) or cholesterol.  Being overweight.  Not getting enough exercise.  A family history of high cholesterol (familial hypercholesterolemia).  Use of tobacco products.  Having diabetes. What are the signs or symptoms? There are no  symptoms of this condition. How is this diagnosed? This condition may be diagnosed based on the results of a blood test.  If you are older than 66 years of age, your health care provider may check your cholesterol levels every 4-6 years.  You may be checked more often if you have high cholesterol or other risk factors for heart disease. The blood test for cholesterol measures:  "Bad" cholesterol, or LDL cholesterol. This is the main type of cholesterol that causes heart disease. The desired level is less than 100 mg/dL.  "Good" cholesterol, or HDL cholesterol. HDL helps protect against heart disease by cleaning the arteries and carrying the LDL to the liver for processing. The desired level for HDL is 60 mg/dL or higher.  Triglycerides. These are fats that your body can store or burn for energy. The desired level is less than 150 mg/dL.  Total cholesterol. This measures the total amount of cholesterol in your blood and includes LDL, HDL, and triglycerides. The desired level is less than 200 mg/dL. How is this treated? This condition may be treated with:  Diet changes. You may be asked to eat foods that have more fiber and less saturated fats or added sugar.  Lifestyle changes. These may include regular exercise, maintaining a healthy weight, and quitting use of tobacco products.  Medicines. These are given when diet and lifestyle changes have not worked. You may be prescribed a statin medicine to help lower your cholesterol levels. Follow these instructions at home: Eating and drinking  Eat a healthy, balanced diet. This diet includes: ? Daily servings of a variety of fresh, frozen, or canned fruits and vegetables. ? Daily servings of whole grain foods that are rich in fiber. ? Foods that are low in saturated fats and trans fats. These include poultry and fish without skin, lean cuts of meat, and low-fat dairy products. ? A variety of fish, especially oily fish that contain omega-3  fatty acids. Aim to eat fish at least 2 times a week.  Avoid foods and drinks that have added sugar.  Use healthy cooking methods, such as roasting, grilling, broiling, baking, poaching, steaming, and stir-frying. Do not fry your food except for stir-frying.   Lifestyle  Get regular exercise. Aim to exercise for a total of 150 minutes a week. Increase your activity level by doing activities such as gardening, walking, and taking the stairs.  Do not use any products that contain nicotine or tobacco, such as cigarettes, e-cigarettes, and chewing tobacco. If you need help quitting, ask your health care provider.   General instructions  Take over-the-counter and prescription medicines only as told by your health care provider.  Keep all follow-up visits as told by your health care provider. This is important. Where to find more information  American Heart Association: www.heart.org  National Heart, Lung, and Blood Institute: PopSte[employer].is Contact a health care provider if:  You have trouble achieving or maintaining a healthy diet or weight.  You are starting an exercise program.  You are unable to stop smoking. Get help right away if:  You have chest pain.  You have trouble breathing.  You have any symptoms of a stroke. "BE FAST" is an easy way to remember the main warning signs of a stroke: ? B - Balance. Signs are dizziness, sudden trouble walking, or loss of balance. ? E - Eyes. Signs are trouble seeing or a sudden change in vision. ?  F - Face. Signs are sudden weakness or numbness of the face, or the face or eyelid drooping on one side. ? A - Arms. Signs are weakness or numbness in an arm. This happens suddenly and usually on one side of the body. ? S - Speech. Signs are sudden trouble speaking, slurred speech, or trouble understanding what people say. ? T - Time. Time to call emergency services. Write down what time symptoms started.  You have other signs of a stroke,  such as: ? A sudden, severe headache with no known cause. ? Nausea or vomiting. ? Seizure. These symptoms may represent a serious problem that is an emergency. Do not wait to see if the symptoms will go away. Get medical help right away. Call your local emergency services (911 in the [place] Do not drive yourself to the hospital. Summary  Cholesterol plaques increase your risk for heart attack and stroke. Work with your health care provider to keep your cholesterol levels in a healthy range.  Eat a healthy, balanced diet, get regular exercise, and maintain a healthy weight.  Do not use any products that contain nicotine or tobacco, such as cigarettes, e-cigarettes, and chewing tobacco.  Get help right away if you have any symptoms of a stroke. This information is not intended to replace advice given to you by your health care provider. Make sure you discuss any questions you have with your health care provider. Document Revised: 05/04/2019 Document Reviewed: 05/04/2019 Elsevier Patient Education  2021 [employer]

## 2020-10-05 ENCOUNTER — Telehealth: Payer: Self-pay | Admitting: Family Medicine

## 2020-10-05 DIAGNOSIS — Z0279 Encounter for issue of other medical certificate: Secondary | ICD-10-CM

## 2020-10-05 NOTE — Telephone Encounter (Signed)
The form is ready  

## 2020-10-05 NOTE — Telephone Encounter (Signed)
Paperwork was dropped off to be completed by Dr. Clent Ridges.  Patient is requesting a call at [phone] when paperwork is completed.  Forms will be left in folder.  Please advise.

## 2020-10-06 NOTE — Telephone Encounter (Signed)
Spoke with pt aware that her [place] form is complete and is ready for pick up at the office. Pt state that she will sen d her husband to pick up form. Form is waiting at the office cabinet and a copy send in to scanning

## 2020-10-17 ENCOUNTER — Ambulatory Visit (INDEPENDENT_AMBULATORY_CARE_PROVIDER_SITE_OTHER): Payer: PPO | Admitting: Family Medicine

## 2020-10-17 ENCOUNTER — Other Ambulatory Visit: Payer: Self-pay

## 2020-10-17 ENCOUNTER — Encounter: Payer: Self-pay | Admitting: Family Medicine

## 2020-10-17 VITALS — BP 126/78 | HR 77 | Temp 98.2°F | Wt 163.0 lb

## 2020-10-17 DIAGNOSIS — G8929 Other chronic pain: Secondary | ICD-10-CM

## 2020-10-17 DIAGNOSIS — M5440 Lumbago with sciatica, unspecified side: Secondary | ICD-10-CM

## 2020-10-17 DIAGNOSIS — F1190 Opioid use, unspecified, uncomplicated: Secondary | ICD-10-CM

## 2020-10-17 DIAGNOSIS — E876 Hypokalemia: Secondary | ICD-10-CM | POA: Diagnosis not present

## 2020-10-17 DIAGNOSIS — M5441 Lumbago with sciatica, right side: Secondary | ICD-10-CM

## 2020-10-17 LAB — BASIC METABOLIC PANEL
BUN: 10 mg/dL (ref 6–23)
CO2: 30 mEq/L (ref 19–32)
Calcium: 9.6 mg/dL (ref 8.4–10.5)
Chloride: 99 mEq/L (ref 96–112)
Creatinine, Ser: 0.91 mg/dL (ref 0.40–1.20)
GFR: 66.28 mL/min (ref >60.00)
Glucose, Bld: 81 mg/dL (ref 70–99)
Potassium: 4.1 mEq/L (ref 3.5–5.1)
Sodium: 136 mEq/L (ref 135–145)

## 2020-10-17 MED ORDER — HYDROCODONE-ACETAMINOPHEN 10-325 MG PO TABS: 1.0000 | ORAL_TABLET | Freq: Four times a day (QID) | ORAL | 0 refills | Status: AC | PRN

## 2020-10-17 MED ORDER — HYDROCODONE-ACETAMINOPHEN 10-325 MG PO TABS: 1.0000 | ORAL_TABLET | Freq: Four times a day (QID) | ORAL | 0 refills | Status: DC | PRN

## 2020-10-17 NOTE — Progress Notes (Signed)
   Subjective:    Patient ID: [name] [name], female    DOB: [birth year], 65 y.o.   MRN: [id number]  HPI Here for pain management, she is doing well.    Review of Systems     Objective:   Physical Exam        Assessment & Plan:  Pain management. Indication for chronic opioid: low back pain Medication and dose: Norco 10-325 # pills per month: 120 Last UDS date: 04-12-20 Opioid Treatment Agreement signed (Y/N): 09-17-17 Opioid Treatment Agreement last reviewed with patient:  10-17-20 [facility] reviewed this encounter (include red flags): Yes Meds were refilled.  Gershon Crane, MD

## 2020-10-24 ENCOUNTER — Telehealth: Payer: Self-pay | Admitting: Family Medicine

## 2020-10-24 NOTE — Telephone Encounter (Signed)
She had a question about seeing Dr. Loreta Ave  Please advise   Call [phone] Aurelio Jew her husband

## 2020-10-24 NOTE — Telephone Encounter (Signed)
Patient is calling and is returning the call, please advise. CB is [phone]

## 2020-10-25 ENCOUNTER — Ambulatory Visit (INDEPENDENT_AMBULATORY_CARE_PROVIDER_SITE_OTHER): Payer: PPO | Admitting: Psychologist

## 2020-10-25 DIAGNOSIS — F3111 Bipolar disorder, current episode manic without psychotic features, mild: Secondary | ICD-10-CM | POA: Diagnosis not present

## 2020-10-25 NOTE — Telephone Encounter (Signed)
Spoke with pt verbalized understanding that there was no documentation regarding a call about colonoscopy

## 2020-10-27 ENCOUNTER — Other Ambulatory Visit: Payer: Self-pay | Admitting: Family Medicine

## 2020-11-01 ENCOUNTER — Other Ambulatory Visit: Payer: Self-pay | Admitting: Family Medicine

## 2020-11-01 NOTE — Telephone Encounter (Signed)
Patient is also needing a refill amphetamine-dextroamphetamine (ADDERALL) 30 MG tablet   [address] - [place], [place] - [address] Phone:  [phone]  Fax:  [phone]

## 2020-11-04 NOTE — Telephone Encounter (Signed)
Patient is calling back to check the status of medication refill for Acyclovir 400 MG.   [facility] - [place], [place] - [address]  [address] [place] [address]  Phone:  [phone] Fax:  [phone]  CB is [phone]

## 2020-11-08 ENCOUNTER — Ambulatory Visit: Payer: Self-pay | Admitting: Psychologist

## 2020-11-10 NOTE — Progress Notes (Signed)
NEUROLOGY FOLLOW UP OFFICE NOTE  [name] [name] [id number]  Assessment/Plan:   1.  Chronic dizziness - multifactorial which I feel is related to polypharmacy and anxiety.  This is a chronic problem which has extensively been worked up.  Unfortunately, I do not have a solution. 2.  Memory deficits - previously attributed to polypharmacy, uncontrolled OSA and underlying psychiatric factors; previous workup negative for underlying neurodegenerative etiology.  However, as memory has gotten worse, re-evaluation is warranted to rule out an emerging neurodegenerative process 3.  Recurrent transient facial droop.  Unclear etiology.  Not consistent with Bell's palsy.  She has had brain MRIs which have been unremarkable.  1.  Neuropsychological evaluation.  Further recommendations pending results. 2.  I have reaffirm to her that there is nothing that I can do for her dizziness.   Subjective:  [name] [name] is a 65 year old right-handed woman with Bipolar disorder, PTSD, depression/anxiety, severe OSA (uses CPAP) and history of left Bell's palsy follows up for dizziness.  She is accompanied by her daughter who supplements history.  UPDATE: Dizziness and balance not improved.  Veers to the right.  Runs into walls.  Falls to the right if she bends over.  She states memory is worse.  She is leaving letters off of words when she writes.  She continues to take alprazolam daily as well as hydrocodone     HISTORY: Problems withbalance problems, dizziness and memory since approximately 2008 or 2009 following a nervous breakdown.  She reports that she had a mental breakdown in 2009.  Since then, she has suffered from multiple symptomatology.  The dizziness and balance problem is not a spinning sensation. Her head feels like it is bobbling. She says she veers to the right. It occurs when walking or when kneeling. She has bilateral aural fullness and tinnitus. She endorses hearing loss  in the left ear. She has been worked up by ENT and neurology in the past. 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 However, she had a repeat audiogram on 07/23/13, which was normal.As per ENT note from 07/28/13, VNG showed suspected mixed central and peripheral findings, as he mentions "spontaneous nystagmus indicative of a vestibular lesion, consistent with central vestibular lesion, left-sided caloric weakness is often peripheral in vestibular finding." Due to her balance problems, she has had multiple falls and concussions.  She has had several brain MRIs from 2008, 04/17/2010, and 04/19/2013 all showed normal major intracranial vascular flow and mild nonspecific white matter changes.   She also reports memory problemssince 2009, specifically short-term memory, which she says has gotten worse. She quickly forgets conversations and has to repeat questions often. She has trouble remembering to take medication. She denies disorientation when driving. She pays her own bills and reports once forgetting to pay a bill, which is not usual. TSH and B12 levels have been normal.  She had neuropsychological testing for memory deficits on 03/25/18 which demonstrated no evidence of cognitive impairment or dementia.  Results overall were within normal range and mild areas of weakness in verbal fluency, auditory memory, and some aspects of executive functioning believed to be due to secondary factors such as medication effects of benzodiazepines and opiates and underlying mood disorder and emotional distress.  She has history of ACDF at C5-6. MRI of the cervical spine was performed on 11/05/06, which showed post-surgical fusion at C5-6 and mild foraminal narrowing at C3-4 but no canal  stenosis. Repeat MRI of cervical spine from 04/19/13 again demonstrated no spinal stenosis. She also has history of back and hip pain with prior  lumbar laminotomy. Most recent MRI of lumbar spine from 07/13/10 showed disc protrusion at L3-4 with facet arthritis, L4-5 disc bulge without stenosis and L5-S1 left laminotomy and facet arthrosis.  In 2009, she also had an episode where she had left eyelid ptosis and right lower facial droop.  She was previously diagnosed with Bell's palsy.  When she gets agitated, she reports that the right side of her mouth will droop and she may have difficulty opening the left eye.    She reports history of severe OSA   PAST MEDICAL HISTORY: Past Medical History:  Diagnosis Date  . ADD (attention deficit disorder)   . Allergy    seasonal  . Anxiety   . Arthritis   . Bell's palsy 2008   pt states she had a 'mini stroke' and nervous breakdown which caused facial drooping  . Bipolar 1 disorder (HCC)   . Chronic narcotic use   . CPAP (continuous positive airway pressure) dependence   . Depression    sees the [facility]   . GERD (gastroesophageal reflux disease)   . Hyperlipidemia   . Hypertension    meds no longer needed  . Memory loss    short term  . Meniere disease   . Obesity (BMI 30-39.9) 11/10/2015  . Post concussion syndrome 07/01/2018  . PPD positive 2007 or 2008  . Sleep apnea    on CPAP therapy  . Urinary incontinence   . Vestibular disorder   . Wears dentures   . Wears glasses     MEDICATIONS: Current Outpatient Medications on File Prior to Visit  Medication Sig Dispense Refill  . acyclovir (ZOVIRAX) 200 MG capsule Take 2 capsules (400 mg total) by mouth daily in the afternoon. 180 capsule 3  . acyclovir (ZOVIRAX) 400 MG tablet TAKE 1 TABLET BY MOUTH 4 TIMES A DAY AS NEEDED 120 tablet 5  . alprazolam (XANAX) 2 MG tablet Take 1 tablet (2 mg total) by mouth 3 (three) times daily as needed for anxiety. 90 tablet 5  . amphetamine-dextroamphetamine (ADDERALL) 30 MG tablet Take 1 tablet by mouth daily before breakfast. 30 tablet 0  . Ascorbic Acid (VITAMIN C) 1000 MG tablet Take  1,000 mg by mouth daily.    [name] azelastine (ASTELIN) 0.1 % nasal spray PLACE 2 SPRAYS INTO BOTH NOSTRILS 2 TIMES DAILY AS DIRECTED (Patient taking differently: Place 1 spray into both nostrils daily.) 30 mL 11  . b complex vitamins capsule Take 1 capsule by mouth daily.    . Cholecalciferol (VITAMIN D3) 125 MCG (5000 UT) CAPS Take 5,000 Units by mouth once a week.    . Echinacea 400 MG CAPS Take by mouth.    [name] FLUoxetine (PROZAC) 10 MG capsule Take 1 capsule (10 mg total) by mouth daily. 90 capsule 3  . Glucosamine-Chondroitin (OSTEO BI-FLEX REGULAR STRENGTH PO) Take 1 tablet by mouth daily.    Melene Muller ON 12/17/2020] HYDROcodone-acetaminophen (NORCO) 10-325 MG tablet Take 1 tablet by mouth every 6 (six) hours as needed for moderate pain. 120 tablet 0  . hydrOXYzine (ATARAX/VISTARIL) 50 MG tablet TAKE 1 TABLET BY MOUTH AT BEDTIME. (Patient taking differently: Take 50 mg by mouth daily.) 30 tablet 11  . Magnesium 500 MG CAPS Take 500 mg by mouth daily.    . melatonin 3 MG TABS tablet Take 3 mg by mouth  at bedtime.    . Multiple Vitamin (MULTIVITAMIN WITH MINERALS) TABS tablet Take 2 tablets by mouth daily.    [name] omeprazole (PRILOSEC) 20 MG capsule TAKE 1 CAPSULE BY MOUTH 2 TIMES DAILY BEFORE A MEAL. 180 capsule 3  . polyethylene glycol (MIRALAX / GLYCOLAX) 17 g packet Take 17 g by mouth daily as needed.    . potassium chloride (KLOR-CON) 10 MEQ tablet TAKE 1 TABLET BY MOUTH DAILY. (Patient taking differently: Take by mouth daily.) 30 tablet 2  . Probiotic Product (PROBIOTIC PO) Take 1 capsule by mouth daily.     . simvastatin (ZOCOR) 80 MG tablet TAKE 1 TABLET BY MOUTH AT BEDTIME. 90 tablet 3  . VITAMIN E PO Take 1 tablet by mouth once a week.     No current facility-administered medications on file prior to visit.    ALLERGIES: Allergies  Allergen Reactions  . Sulfonamide Derivatives Swelling    Facial swelling  . Flexeril [Cyclobenzaprine] Other (See Comments)    Dizziness, made meniere's  disease worse   . Hydromorphone Itching  . Morphine And Related Nausea And Vomiting  . Sulfamethoxazole Other (See Comments)    Causes swelling     FAMILY HISTORY: Family History  Problem Relation Age of Onset  . Dementia Mother   . Hyperlipidemia Mother   . Asthma Father   . Hypertension Sister   . Hyperlipidemia Sister   . Coronary artery disease Sister   . Thyroid disease Sister   . Hypertension Brother   . Hyperlipidemia Brother   . Coronary artery disease Brother   . Hypertension Sister   . Hyperlipidemia Sister   . Alcohol abuse Other        fhx  . Arthritis Other        fhx  . Coronary artery disease Other        fhx  . Depression Other        fhx  . Diabetes Other        fhx  . Hyperlipidemia Other        fhx  . Hypertension Other        fhx  . Sudden death Other        fhx      Objective:  Blood pressure 116/75, pulse 79, height 5\' 2"  (1.575 m), weight 159 lb 9.6 oz (72.4 kg), SpO2 97 %. General: No acute distress.  Patient appears well-groomed.   Head:  Normocephalic/atraumatic Eyes:  Fundi examined but not visualized Neck: supple, no paraspinal tenderness, full range of motion Heart:  Regular rate and rhythm Lungs:  Clear to auscultation bilaterally Back: No paraspinal tenderness Neurological Exam: alert and oriented.  Speech fluent and not dysarthric, language intact.  CN II-XII intact. Bulk and tone normal, muscle strength 5/5 throughout.  Sensation to pinprick and vibration intact.  Deep tendon reflexes 2+ throughout, toes downgoing.  Finger to nose and heel to shin testing intact.  Gait normal but unable to tandem walk, Romberg negative.     , DO  CC: Shon Millet, MD

## 2020-11-11 ENCOUNTER — Ambulatory Visit: Payer: PPO | Admitting: Neurology

## 2020-11-11 ENCOUNTER — Other Ambulatory Visit: Payer: Self-pay

## 2020-11-11 ENCOUNTER — Encounter: Payer: Self-pay | Admitting: Neurology

## 2020-11-11 VITALS — BP 116/75 | HR 79 | Ht 62.0 in | Wt 159.6 lb

## 2020-11-11 DIAGNOSIS — R413 Other amnesia: Secondary | ICD-10-CM | POA: Diagnosis not present

## 2020-11-11 DIAGNOSIS — R42 Dizziness and giddiness: Secondary | ICD-10-CM | POA: Diagnosis not present

## 2020-11-11 NOTE — Patient Instructions (Signed)
1.  We will order neurocognitive testing.  Further recommendations pending results. 2.  Follow up after testing.

## 2020-11-17 ENCOUNTER — Other Ambulatory Visit: Payer: Self-pay | Admitting: Family Medicine

## 2020-11-21 ENCOUNTER — Telehealth: Payer: Self-pay | Admitting: Family Medicine

## 2020-11-21 NOTE — Telephone Encounter (Signed)
Spoke with pt pharmacy state that pt has enough refill for her hydrocodone, called pt notified that her pharmacy is refilling her Rx and that they should text her when ready

## 2020-11-21 NOTE — Telephone Encounter (Signed)
Patient is calling and requesting a refill for HYDROcodone-acetaminophen (NORCO) 10-325 MG tablet to be sent to   [facility] - [place], [place] - [address]  [address] [place] [place] [address]  Phone:  [phone] Fax:  [phone]  CB is [phone]

## 2020-12-05 ENCOUNTER — Telehealth: Payer: Self-pay | Admitting: Family Medicine

## 2020-12-05 MED ORDER — AMPHETAMINE-DEXTROAMPHETAMINE 30 MG PO TABS: 30.0000 mg | ORAL_TABLET | Freq: Every day | ORAL | 0 refills | Status: DC

## 2020-12-05 NOTE — Telephone Encounter (Signed)
Last refill- 11/02/20--30 tabs no refills Last office visit- 10/17/20  No future office visit scheduled

## 2020-12-05 NOTE — Telephone Encounter (Signed)
Pt is calling in to get a refill on her Rx amphetamine-dextroamphetamine (ADDERALL) 30 MG  Pharm:  [facility]

## 2020-12-05 NOTE — Telephone Encounter (Signed)
Done

## 2020-12-06 NOTE — Telephone Encounter (Signed)
Message complete nothing further

## 2020-12-21 ENCOUNTER — Telehealth: Payer: Self-pay | Admitting: Pharmacist

## 2020-12-21 NOTE — Chronic Care Management (AMB) (Signed)
    Chronic Care Management Pharmacy Assistant   Name: [name] [name]  MRN: [id number] DOB: [birth year]  12-21-2020 - Called Patient to remind of appointment with Gaylord Shih) on (12-22-2020 via telephone at 11:00 am )  Patient aware of appointment date, time, and type of appointment (either telephone or in person). Patient aware to have/bring all medications, supplements, blood pressure and/or blood sugar logs to visit.  Questions: Have you had any recent office visit or specialist visit outside of [facility]?  No.  Are there any concerns you would like to discuss during your office visit? Patient states she was stung 3 days ago 5 times by a bee and has not been feeling well since. Patient states she is not allergic to bees and has never been. She states she feels a little better this morning.  Are you having any problems obtaining your medications? (Whether it pharmacy issues or cost)  No issues at this time.  If patient has any PAP medications ask if they are having any problems getting their PAP medication or refill?  No PAP at this time.   Star Rating Drug:  Simvastatin 80mg  - Last filled 11-14-2020 90DS at [facility]   Any gaps in medications fill history? [facility]  Clinical Pharmacist Assistant [phone]

## 2020-12-21 NOTE — Progress Notes (Signed)
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 medication adherence by filling out weekly pill box and marking calendar for doses taken target a minimum of 150 minutes of moderate intensity exercise weekly  Follow Up Plan: Telephone follow up appointment with care management team member scheduled for: 4-5 months      Medication Assistance: None required.  Patient affirms current coverage meets needs.  Compliance/Adherence/Medication fill history: Care Gaps: COVID vaccine, shingrix, pap smear, mammogram, DEXA   Star-Rating Drugs: Simvastatin 80mg  - Last filled 11-14-2020 90DS at [facility]   Patient's preferred pharmacy is:  [place], [address] [place] [address] Phone: [phone] Fax: [phone]  Uses pill box? Yes Pt endorses 80% compliance - husband is filling the pillbox and they are using the calendar to mark the days she has taken her medications (filling pillbox on  Mondays)  We discussed: Benefits of medication synchronization, packaging and delivery as well as enhanced pharmacist oversight with [employer]. Patient decided to: Continue current medication management strategy  Care Plan and Follow Up Patient Decision:  Patient agrees to Care Plan and Follow-up.  Plan: Telephone follow up appointment with care management team member scheduled for:  3 months  Jeni Salles, PharmD Central Pharmacist [place] [facility] [place] [phone]

## 2020-12-22 ENCOUNTER — Ambulatory Visit (INDEPENDENT_AMBULATORY_CARE_PROVIDER_SITE_OTHER): Payer: PPO | Admitting: Pharmacist

## 2020-12-22 DIAGNOSIS — I10 Essential (primary) hypertension: Secondary | ICD-10-CM | POA: Diagnosis not present

## 2020-12-22 DIAGNOSIS — F419 Anxiety disorder, unspecified: Secondary | ICD-10-CM

## 2020-12-22 NOTE — Patient Instructions (Signed)
Hi [name] [name],  It was great to speak with you again! Below is a summary of some of the topics we discussed.   Don't forget to call behavioral health at [phone] to schedule an appointment.  Please reach out to me if you have any questions or need anything before our follow up!  Best, [name]  Gaylord Shih, PharmD, [name] [name] Clinical Pharmacist [facility] [place] [phone]   Visit Information   Goals Addressed   None    Patient Care Plan: CCM Pharmacy Care Plan     Problem Identified: Problem: Hypertension, Hyperlipidemia, GERD, Depression, Anxiety, Allergic Rhinitis and ADHD and back pain      Long-Range Goal: Patient-Specific Goal   Start Date: 09/21/2020  Expected End Date: 09/21/2021  Recent Progress: On track  Priority: High  Note:   Current Barriers:  Unable to independently monitor therapeutic efficacy Unable to achieve control of cholesterol  Unable to self administer medications as prescribed   Pharmacist Clinical Goal(s):  Patient will achieve adherence to monitoring guidelines and medication adherence to achieve therapeutic efficacy achieve control of cholesterol as evidenced by next lipid panel  through collaboration with PharmD and provider.   Interventions: 1:1 collaboration with Nelwyn Salisbury, MD regarding development and update of comprehensive plan of care as evidenced by provider attestation and co-signature Inter-disciplinary care team collaboration (see longitudinal plan of care) Comprehensive medication review performed; medication list updated in electronic medical record  Hypertension (BP goal <140/90) -Controlled -Current treatment: No medications -Medications previously tried: n/a  -Current home readings: does not check at home -Current dietary habits: been eating more fruit and vegetables and not eating out at all -Current exercise habits: would like to start walking and recommended trying yoga on youtube to improve  balance -Denies hypotensive/hypertensive symptoms -Educated on Daily salt intake goal < 2300 mg; Exercise goal of 150 minutes per week; -Counseled to monitor BP at home as directed, document, and provide log at future appointments -Counseled on diet and exercise extensively  Hyperlipidemia: (LDL goal < 100) -Uncontrolled -Current treatment: simvastatin 80mg , 1 tablet once daily  -Medications previously tried: none  -Current dietary patterns: doing much better with diet; not eating out at all and eating lots of fruits. Seafood and stir fry vegetables -Current exercise habits: no consistent exercise -Educated on Cholesterol goals;  Benefits of statin for ASCVD risk reduction; Importance of limiting foods high in cholesterol; Exercise goal of 150 minutes per week; -Counseled on diet and exercise extensively Recommended to switch to high intensity statin therapy  Depression/Anxiety/bipolar disorder (Goal: minimize symptoms) -Not ideally controlled -Current treatment: fluoxetine 10mg , 2 tablets once daily alprazolam 2mg , 1 tablet three times daily as needed (patient reports taking about 2 tablets/day) -Medications previously tried/failed: Seroquel, Lithium -PHQ9: 0 -GAD7: n/a -Connected with Health phone number [phone]) for mental health support -Educated on Benefits of medication for symptom control Benefits of cognitive-behavioral therapy with or without medication -Counseled on spacing out hydrocodone and alprazolam to prevent excessive sedation and accidental overdose Recommended calling behavioral health to set up an appointment  Insomnia(Goal: improve quality and quantity of sleep) -Controlled -Current treatment  Inspire device  Melatonin 10 mg liquid at bedtime  Hydroxyzine 50 mg 1 tablet at bedtime as needed -Medications previously tried: Seroquel, Zzquil  -Counseled on maximum of 10 mg per night of melatonin  Allergic rhinitis (Goal: minimize  symptoms) -Controlled -Current treatment  azelastine (Astelin) 0.1% nasal spray, 2 sprays into both nostrils twice daily as directed -Medications previously tried:  none  -Recommended to continue current medication  Arthritis pain (Goal: minimize pain) -Controlled -Current treatment  Osteo Bi-flex (glucosamine condroitin & vitamin D) 2000 units daily -Medications previously tried: none  -Counseled on giving the medicine a few weeks trial before deciding if it is helping; discussed stopping vitamin D supplementation with this as the replacement  GERD (Goal: minimize symptoms) -Controlled -Current treatment  Omeprazole 20 mg 1 capsule twice daily -Medications previously tried: none  -Recommended to continue current medication  Low potassium (Goal: 3.5-5) -Controlled -Current treatment  Potassium chloride 10 mEq 1 tablet daily -Medications previously tried: none  -Recommended repeat potassium level  Low back pain (Goal: minimize pain) -Not ideally controlled -Current treatment  Hydrocodone/ acetaminophen (Norco) 10/325mg , 1 tablet every six hours as needed for moderate pain (patient reports usage depends on activities of the day; some days she may take every six hours, other days she might not at all - no more than 2 per day)  -Medications previously tried: none  -Counseled on separating from other sedating medications  Health Maintenance -Vaccine gaps: second dose of shingrix, COVID vaccine -Current therapy:  Vitamin E 1 tablet once a week Magnesium 500 mg daily Vitamin B complex 1 capsule daily Multivitamin 1 tablet daily Acyclovir 400 mg 1 tablet four times daily - patient only takes once daily -Educated on Cost vs benefit of each product must be carefully weighed by individual consumer -Patient is satisfied with current therapy and denies issues -Recommended to continue current medication  Patient Goals/Self-Care Activities Patient will:  - take medications as  prescribed focus on medication adherence by filling out weekly pill box and marking calendar for doses taken target a minimum of 150 minutes of moderate intensity exercise weekly  Follow Up Plan: Telephone follow up appointment with care management team member scheduled for: 4-5 months       Patient verbalizes understanding of instructions provided today and agrees to view in MyChart.  Telephone follow up appointment with pharmacy team member scheduled for:4 months  Verner Chol, [facility]

## 2021-01-03 ENCOUNTER — Telehealth: Payer: Self-pay | Admitting: Family Medicine

## 2021-01-03 NOTE — Telephone Encounter (Signed)
amphetamine-dextroamphetamine (ADDERALL) 30 MG tablet  [address] - [place], [place] - [address] Phone:  [phone]  Fax:  [phone]

## 2021-01-03 NOTE — Telephone Encounter (Signed)
I called the pt and informed her refills were previously sent to the pharmacy and to contact [facility] with questions.

## 2021-01-16 ENCOUNTER — Telehealth: Payer: Self-pay | Admitting: Pharmacist

## 2021-01-16 NOTE — Telephone Encounter (Signed)
Called patient and left voicemail to follow up on discussion about behavioral health with last CCM visit. Requested a call back to the office. Will try to call patient again later this week if she does not call the office back.

## 2021-01-17 ENCOUNTER — Ambulatory Visit: Payer: PPO | Admitting: Family Medicine

## 2021-01-23 NOTE — Telephone Encounter (Signed)
Called and spoke with patient about concerns with behavioral health. Recommended calling the department again to get scheduled with a different provider and recommended giving her concerns to the scheduling team so they are aware of her history. Patient reported she will call them today and thanked me for my call.

## 2021-01-24 ENCOUNTER — Other Ambulatory Visit: Payer: Self-pay

## 2021-01-25 ENCOUNTER — Encounter: Payer: Self-pay | Admitting: Family Medicine

## 2021-01-25 ENCOUNTER — Telehealth (INDEPENDENT_AMBULATORY_CARE_PROVIDER_SITE_OTHER): Payer: PPO | Admitting: Family Medicine

## 2021-01-25 ENCOUNTER — Ambulatory Visit: Payer: PPO | Admitting: Family Medicine

## 2021-01-25 DIAGNOSIS — F900 Attention-deficit hyperactivity disorder, predominantly inattentive type: Secondary | ICD-10-CM

## 2021-01-25 DIAGNOSIS — G8929 Other chronic pain: Secondary | ICD-10-CM | POA: Diagnosis not present

## 2021-01-25 DIAGNOSIS — F419 Anxiety disorder, unspecified: Secondary | ICD-10-CM

## 2021-01-25 DIAGNOSIS — F312 Bipolar disorder, current episode manic severe with psychotic features: Secondary | ICD-10-CM | POA: Diagnosis not present

## 2021-01-25 DIAGNOSIS — M5440 Lumbago with sciatica, unspecified side: Secondary | ICD-10-CM

## 2021-01-25 MED ORDER — OMEPRAZOLE 20 MG PO CPDR: 20.0000 mg | DELAYED_RELEASE_CAPSULE | Freq: Every day | ORAL | 3 refills | Status: DC

## 2021-01-25 MED ORDER — FLUOXETINE HCL 20 MG PO CAPS: 20.0000 mg | ORAL_CAPSULE | Freq: Every day | ORAL | 3 refills | Status: DC

## 2021-01-25 NOTE — Progress Notes (Signed)
Subjective:    Patient ID: [name] [name], female    DOB: March 28, 1956, 65 y.o.   MRN: [id number]  HPI Virtual Visit via Video Note  I connected with the patient on 01/25/21 at 11:00 AM EDT by a video enabled telemedicine application and verified that I am speaking with the correct person using two identifiers.  Location patient: home Location provider:work or home office Persons participating in the virtual visit: patient, provider  I discussed the limitations of evaluation and management by telemedicine and the availability of in person appointments. The patient expressed understanding and agreed to proceed.   HPI: Here to discuss her medications. First of all, she is proud to tell me that she quit smoking as of one month ago! She has also weaned herself off Hydrocodone, because she got tired of taking it. This was one month ago, and her back pain has not increased. She also stopped taking Adderall as of 2 weeks ago, and she has adjusted well. Her only complaint today is her anxiety, which has been more of a problem lately. She tries not to take Xanax 3 times a day, but sometimes she has to. She has been on Prozac 10 mg daily for several years.    ROS: See pertinent positives and negatives per HPI.  Past Medical History:  Diagnosis Date   ADD (attention deficit disorder)    Allergy    seasonal   Anxiety    Arthritis    Bell's palsy 2008   pt states she had a 'mini stroke' and nervous breakdown which caused facial drooping   Bipolar 1 disorder (HCC)    Chronic narcotic use    CPAP (continuous positive airway pressure) dependence    Depression    sees the [facility]    GERD (gastroesophageal reflux disease)    Hyperlipidemia    Hypertension    meds no longer needed   Memory loss    short term   Meniere disease    Obesity (BMI 30-39.9) 11/10/2015   Post concussion syndrome 07/01/2018   PPD positive 2007 or 2008   Sleep apnea    on CPAP therapy   Urinary  incontinence    Vestibular disorder    Wears dentures    Wears glasses     Past Surgical History:  Procedure Laterality Date   ABDOMINAL HYSTERECTOMY     ANTERIOR FUSION CERVICAL SPINE  2006   Dr. Wynetta Emery   BREAST BIOPSY Right 1981   benign   BREAST EXCISIONAL BIOPSY Right 1981   benign   scar does not show   BREAST SURGERY Bilateral 12-11-12   reductions per Dr. Ivin Booty in [facility]    CATARACT EXTRACTION, BILATERAL Bilateral last summer of 2018   at [facility]   COLONOSCOPY  04/25/2016   per Dr. Loreta Ave, adenomatous polyps, repeat in 5 yrs    DRUG INDUCED ENDOSCOPY N/A 01/30/2019   Procedure: DRUG INDUCED ENDOSCOPY;  Surgeon: Christia Reading, MD;  Location: [facility];  Service: ENT;  Laterality: N/A;   ESOPHAGOGASTRODUODENOSCOPY  04-23-11   per Dr. Loreta Ave, normal    EYE SURGERY  06/18/2016   L eyelid    IMPLANTATION OF HYPOGLOSSAL NERVE STIMULATOR Right 03/25/2019   Procedure: IMPLANTATION OF HYPOGLOSSAL NERVE STIMULATOR;  Surgeon: Christia Reading, MD;  Location: [facility] OR;  Service: ENT;  Laterality: Right;   LUMBAR DISC SURGERY  2003   Dr. Wynetta Emery   MULTIPLE TOOTH EXTRACTIONS     ORIF WRIST FRACTURE Right  08/29/2018   REDUCTION MAMMAPLASTY Bilateral 2013   SPLIT NIGHT STUDY  11/11/2015   UVULOPALATOPHARYNGOPLASTY     VAGINAL HYSTERECTOMY  12/96   secondary to fibroids.    Family History  Problem Relation Age of Onset   Dementia Mother    Hyperlipidemia Mother    Asthma Father    Hypertension Sister    Hyperlipidemia Sister    Coronary artery disease Sister    Thyroid disease Sister    Hypertension Brother    Hyperlipidemia Brother    Coronary artery disease Brother    Hypertension Sister    Hyperlipidemia Sister    Alcohol abuse Other        fhx   Arthritis Other        fhx   Coronary artery disease Other        fhx   Depression Other        fhx   Diabetes Other        fhx   Hyperlipidemia Other        fhx   Hypertension Other        fhx    Sudden death Other        fhx     Current Outpatient Medications:    acyclovir (ZOVIRAX) 200 MG capsule, Take 2 capsules (400 mg total) by mouth daily in the afternoon., Disp: 180 capsule, Rfl: 3   acyclovir (ZOVIRAX) 400 MG tablet, TAKE 1 TABLET BY MOUTH 4 TIMES A DAY AS NEEDED, Disp: 120 tablet, Rfl: 5   alprazolam (XANAX) 2 MG tablet, Take 1 tablet (2 mg total) by mouth 3 (three) times daily as needed for anxiety., Disp: 90 tablet, Rfl: 5   Ascorbic Acid (VITAMIN C) 1000 MG tablet, Take 1,000 mg by mouth daily., Disp: , Rfl:    azelastine (ASTELIN) 0.1 % nasal spray, PLACE 2 SPRAYS INTO BOTH NOSTRILS 2 TIMES DAILY AS DIRECTED (Patient taking differently: Place 1 spray into both nostrils daily.), Disp: 30 mL, Rfl: 11   b complex vitamins capsule, Take 1 capsule by mouth daily., Disp: , Rfl:    Cholecalciferol (VITAMIN D3) 125 MCG (5000 UT) CAPS, Take 5,000 Units by mouth once a week., Disp: , Rfl:    Echinacea 400 MG CAPS, Take by mouth., Disp: , Rfl:    FLUoxetine (PROZAC) 20 MG capsule, Take 1 capsule (20 mg total) by mouth daily., Disp: 90 capsule, Rfl: 3   Glucosamine-Chondroitin (OSTEO BI-FLEX REGULAR STRENGTH PO), Take 1 tablet by mouth daily., Disp: , Rfl:    Magnesium 500 MG CAPS, Take 500 mg by mouth daily., Disp: , Rfl:    melatonin 3 MG TABS tablet, Take 3 mg by mouth at bedtime., Disp: , Rfl:    Multiple Vitamin (MULTIVITAMIN WITH MINERALS) TABS tablet, Take 2 tablets by mouth daily., Disp: , Rfl:    polyethylene glycol (MIRALAX / GLYCOLAX) 17 g packet, Take 17 g by mouth daily as needed., Disp: , Rfl:    potassium chloride (KLOR-CON) 10 MEQ tablet, TAKE 1 TABLET BY MOUTH DAILY., Disp: 30 tablet, Rfl: 2   Probiotic Product (PROBIOTIC PO), Take 1 capsule by mouth daily. , Disp: , Rfl:    simvastatin (ZOCOR) 80 MG tablet, TAKE 1 TABLET BY MOUTH AT BEDTIME., Disp: 90 tablet, Rfl: 3   VITAMIN E PO, Take 1 tablet by mouth once a week., Disp: , Rfl:    omeprazole (PRILOSEC) 20 MG  capsule, Take 1 capsule (20 mg total) by mouth at bedtime., Disp: 180  capsule, Rfl: 3  EXAM:  VITALS per patient if applicable:  GENERAL: alert, oriented, appears well and in no acute distress  HEENT: atraumatic, conjunttiva clear, no obvious abnormalities on inspection of external nose and ears  NECK: normal movements of the head and neck  LUNGS: on inspection no signs of respiratory distress, breathing rate appears normal, no obvious gross SOB, gasping or wheezing  CV: no obvious cyanosis  MS: moves all visible extremities without noticeable abnormality  PSYCH/NEURO: pleasant and cooperative, no obvious depression or anxiety, speech and thought processing grossly intact  ASSESSMENT AND PLAN: For her anxiety, she will continue to take Xanax as needed, but we will increase the Prozac to 20 mg daily. Her ADHD is stable off the Adderall. Her back pain is stable off Norco. She will follow up in one month. We spent 35 minutes reviewing records and discussing these issues.   Gershon Crane, MD  Discussed the following assessment and plan:  No diagnosis found.     I discussed the assessment and treatment plan with the patient. The patient was provided an opportunity to ask questions and all were answered. The patient agreed with the plan and demonstrated an understanding of the instructions.   The patient was advised to call back or seek an in-person evaluation if the symptoms worsen or if the condition fails to improve as anticipated.      Review of Systems     Objective:   Physical Exam        Assessment & Plan:

## 2021-01-31 ENCOUNTER — Telehealth: Payer: Self-pay | Admitting: Family Medicine

## 2021-01-31 NOTE — Telephone Encounter (Signed)
A form was dropped off for the patient that she would like completed.  Please call [phone] when form is completed and ready to pickup.  Paperwork will be placed in folder.  Please advise.

## 2021-02-01 NOTE — Telephone Encounter (Signed)
Pt dropped off her Placard Form  at the office for completing, left a detailed message on pt phone advising that Dr Clent Ridges is out of the office until 02/06/2021.

## 2021-02-06 NOTE — Telephone Encounter (Signed)
The form is ready to be picked up  

## 2021-02-08 NOTE — Telephone Encounter (Signed)
Pt is aware to pick up Placard from the office

## 2021-02-09 ENCOUNTER — Ambulatory Visit (INDEPENDENT_AMBULATORY_CARE_PROVIDER_SITE_OTHER): Payer: PPO | Admitting: Psychology

## 2021-02-09 ENCOUNTER — Other Ambulatory Visit: Payer: Self-pay

## 2021-02-09 DIAGNOSIS — G4733 Obstructive sleep apnea (adult) (pediatric): Secondary | ICD-10-CM

## 2021-02-09 DIAGNOSIS — F314 Bipolar disorder, current episode depressed, severe, without psychotic features: Secondary | ICD-10-CM

## 2021-02-09 DIAGNOSIS — R4189 Other symptoms and signs involving cognitive functions and awareness: Secondary | ICD-10-CM

## 2021-02-09 DIAGNOSIS — F411 Generalized anxiety disorder: Secondary | ICD-10-CM

## 2021-02-10 ENCOUNTER — Encounter: Payer: Self-pay | Admitting: Psychology

## 2021-02-10 DIAGNOSIS — R1013 Epigastric pain: Secondary | ICD-10-CM | POA: Insufficient documentation

## 2021-02-10 DIAGNOSIS — K5904 Chronic idiopathic constipation: Secondary | ICD-10-CM | POA: Insufficient documentation

## 2021-02-10 DIAGNOSIS — Z860100 Personal history of colon polyps, unspecified: Secondary | ICD-10-CM | POA: Insufficient documentation

## 2021-02-10 NOTE — Progress Notes (Signed)
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 average. Likewise, Ms. Riedlinger did not exhibit any difficulties comprehending task instructions and answered all questions asked of her appropriately. Assessed expressive language was mildly variable. Phonemic fluency was below average, semantic fluency was well below average, and confrontation naming was average.     Assessed visuospatial/visuoconstructional abilities were below average to average. A point was lost on her drawing of a clock due to no size differentiation between clock hands. Points were lost on her copy of a complex figure largely due to poor planning.    Learning (i.e., encoding) of novel verbal information was exceptionally low across a list learning task but average across a story learning task. Spontaneous delayed recall (i.e., retrieval) of previously learned information was commensurate with performances across initial learning trials. Retention rates were 71% across a story learning task and 67% across a list learning task. Performance across recognition tasks was poor across a list learning task but strong across a story-based task, suggesting some evidence for information  consolidation.   Results of emotional screening instruments suggested that recent symptoms of generalized anxiety were in the severe range, while symptoms of depression were also within the severe range. A screening instrument assessing recent sleep quality suggested the presence of severe sleep dysfunction.  Tables of Scores:   Note: This summary of test scores accompanies the interpretive report and should not be considered in isolation without reference to the appropriate sections in the text. Descriptors are based on appropriate normative data and may be adjusted based on clinical judgment. Terms such as "Within Normal Limits" and "Outside Normal Limits" are used when a more specific description of the test score cannot be determined.       Percentile - Normative Descriptor > 98 - Exceptionally High 91-97 - Well Above Average 75-90 - Above Average 25-74 - Average 9-24 - Below Average 2-8 - Well Below Average < 2 - Exceptionally Low       Validity:   DESCRIPTOR       ACS Word Choice: --- --- Within Normal Limits  Dot Counting Test: --- --- Within Normal Limits  HVLT-R Recognition Discrimination Index: --- --- Within Normal Limits       Orientation:      Raw Score Percentile   NAB Orientation, Form 1 28/29 --- ---       Intellectual Functioning:      Standard Score Percentile   Test of Premorbid Functioning: 89 23 Below Average       Memory:     Wechsler Memory Scale (WMS-IV):  Raw Score (Scaled Score) Percentile     Logical Memory I 28/53 (8) 25 Average    Logical Memory II 15/39 (8) 25 Average    Logical Memory Recognition 18/23 26-50 Average       Hopkins Verbal Learning Test (HVLT-R), Form 1: Raw Score (T Score) Percentile     Total Trials 1-3 15/36 (24) <1 Exceptionally Low    Delayed Recall 4/12 (21) <1 Exceptionally Low    Recognition Discrimination Index 7 (24) <1 Exceptionally Low      True Positives 8 --- ---      False Positives 1 --- ---         Attention/Executive Function:     Trail Making Test (TMT): Raw Score (T Score) Percentile     Part A 29 secs.,  0 errors (51) 54 Average    Part B 279 secs.,  5 errors (22) <1 Exceptionally Low         Scaled Score Percentile   WAIS-IV Coding: 9 37 Average       NAB Attention Module, Form 1: T Score Percentile     Digits Forward 48 42 Average    Digits Backwards 43 25 Average        Scaled Score Percentile   WAIS-IV Similarities: 8 25 Average       D-KEFS Color-Word Interference Test: Raw Score (Scaled Score) Percentile     Color Naming 50 secs. (3) 1 Exceptionally Low    Word Reading 28 secs. (8) 25 Average    Inhibition 123 secs. (1) <1 Exceptionally Low      Total Errors 3 errors (9) 37 Average    Inhibition/Switching 104 secs. (6) 9 Below Average      Total Errors 7 errors (6) 9 Below Average       NAB Executive Functions Module, Form 1: T Score Percentile     Judgment 51 54 Average       Language:     Verbal Fluency Test: Raw Score (T Score) Percentile     Phonemic Fluency (FAS) 32 (41) 18 Below Average    Animal Fluency 12 (33) 5 Well Below Average        NAB Language Module, Form 1: T Score Percentile     Auditory Comprehension 55 69 Average    Naming 29/31 (45) 31 Average       Visuospatial/Visuoconstruction:      Raw Score Percentile   Clock Drawing: 9/10 --- Within Normal Limits       NAB Spatial Module, Form 1: T Score Percentile     Figure Drawing Copy 52 58 Average        Scaled Score Percentile   WAIS-IV Block Design: 6 9 Below Average       Mood and Personality:      Raw Score Percentile   Beck Depression Inventory - II: 41 --- Severe  PROMIS Anxiety Questionnaire: 29 --- Severe       Additional Questionnaires:      Raw Score Percentile   PROMIS Sleep Disturbance Questionnaire: 38 --- Severe   Informed Consent and Coding/Compliance:   The current evaluation represents a clinical evaluation for the purposes previously outlined by the  referral source and is in no way reflective of a forensic evaluation.   Ms. Sherral HammersRobbins was provided with a verbal description of the nature and purpose of the present neuropsychological evaluation. Also reviewed were the foreseeable risks and/or discomforts and benefits of the procedure, limits of confidentiality, and  mandatory reporting requirements of this provider. The patient was given the opportunity to ask questions and receive answers about the evaluation. Oral consent to participate was provided by the patient.   This evaluation was conducted by Newman Nickels, Ph.D., licensed clinical neuropsychologist. Ms. Lemire completed a clinical interview with Dr. Milbert Coulter, billed as one unit [phone], and 120 minutes of cognitive testing and scoring, billed as one unit [phone] and three additional units [id number]. As a separate and discrete service, Dr. Milbert Coulter spent a total of 160 minutes in interpretation and report writing billed as one unit [phone] and two units [id number].

## 2021-02-16 ENCOUNTER — Other Ambulatory Visit: Payer: Self-pay

## 2021-02-16 ENCOUNTER — Ambulatory Visit (INDEPENDENT_AMBULATORY_CARE_PROVIDER_SITE_OTHER): Payer: PPO | Admitting: Psychology

## 2021-02-16 ENCOUNTER — Other Ambulatory Visit: Payer: Self-pay | Admitting: Family Medicine

## 2021-02-16 DIAGNOSIS — F332 Major depressive disorder, recurrent severe without psychotic features: Secondary | ICD-10-CM | POA: Diagnosis not present

## 2021-02-16 DIAGNOSIS — F411 Generalized anxiety disorder: Secondary | ICD-10-CM | POA: Diagnosis not present

## 2021-02-16 DIAGNOSIS — R4189 Other symptoms and signs involving cognitive functions and awareness: Secondary | ICD-10-CM

## 2021-02-16 NOTE — Progress Notes (Signed)
   Neuropsychology Feedback Session [name] [name]. [facility] Department of Neurology  Reason for Referral:   [name] [name] is a 65 y.o. right-handed Caucasian female referred by Shon Millet, D.O., to characterize her current cognitive functioning and assist with diagnostic clarity and treatment planning in the context of subjective cognitive decline and numerous medical and psychiatric comorbidities.   Feedback:   [name] completed a comprehensive neuropsychological evaluation on 02/09/2021. Please refer to that encounter for the full report and recommendations. Briefly, results suggested primary deficits surrounding certain aspects of executive functioning, namely cognitive flexibility and response inhibition. A further weakness was exhibited across semantic fluency, while variability was exhibited across both encoding (i.e., learning) and retrieval aspects of verbal memory. Results of her previous neuropsychological evaluation conducted in 2019 suggested primary deficits surrounding verbal fluency, auditory memory, and some aspects of executive functioning. Her overall performance today is quite consistent with patterns seen across previous testing. She actually saw some mild improvements surrounding story-based memory, phonemic fluency, and confrontation naming. I do not see any evidence to suggest pronounced or even modest cognitive decline between the two time points. Across mood-related questionnaires, [name] reported acute symptoms of severe anxiety and severe depression. She also reported severe sleep dysfunction and appears to have suboptimally managed sleep apnea (given that she commonly forgets to turn on her Inspire device). Overall, I agree with Dr. Alinda Dooms in that both reported day-to-day cognitive dysfunction, as well as patterns of weakness across testing, are due to ongoing severe psychiatric distress, as well as sleep dysfunction, and chronic pain symptoms.    [name] was accompanied by her husband during the current feedback session. Content of the current session focused on the results of her neuropsychological evaluation. [name] and her husband were given the opportunity to ask questions and their questions were answered. They were encouraged to reach out should additional questions arise. A copy of her report was provided at the conclusion of the visit.      30 minutes were spent conducting the current feedback session with [name], billed as one unit [phone].

## 2021-02-23 ENCOUNTER — Other Ambulatory Visit: Payer: Self-pay | Admitting: Family Medicine

## 2021-02-23 NOTE — Telephone Encounter (Signed)
Pt LOV was on 01/25/2021 Last refill done on 08/16/2020 Please advise

## 2021-02-24 ENCOUNTER — Telehealth: Payer: Self-pay | Admitting: Pharmacist

## 2021-02-24 NOTE — Telephone Encounter (Signed)
Patient called to ask what she could do for sleep medications as far as over the counter products go. Recommended against high doses of melatonin (>10 mg) as patient inquired about taking more. Discussed how taking higher doses can have the opposite of the intended effect and throw off sleep cycle.  Scheduled patient a virtual visit with PCP to discuss further and consider prescription options.

## 2021-02-28 ENCOUNTER — Encounter: Payer: Self-pay | Admitting: Family Medicine

## 2021-02-28 ENCOUNTER — Telehealth (INDEPENDENT_AMBULATORY_CARE_PROVIDER_SITE_OTHER): Payer: PPO | Admitting: Family Medicine

## 2021-02-28 DIAGNOSIS — F314 Bipolar disorder, current episode depressed, severe, without psychotic features: Secondary | ICD-10-CM

## 2021-02-28 DIAGNOSIS — G4700 Insomnia, unspecified: Secondary | ICD-10-CM

## 2021-02-28 NOTE — Progress Notes (Signed)
Subjective:    Patient ID: [name] [name], female    DOB: 1955/07/22, 65 y.o.   MRN: [id number]  HPI Virtual Visit via Video Note  I connected with the patient on 02/28/21 at  1:15 PM EDT by a video enabled telemedicine application and verified that I am speaking with the correct person using two identifiers.  Location patient: home Location provider:work or home office Persons participating in the virtual visit: patient, provider  I discussed the limitations of evaluation and management by telemedicine and the availability of in person appointments. The patient expressed understanding and agreed to proceed.   HPI: Here asking for advice about sleep aids. She is currently taking Prozac in the morning and Xanax TID, but she has had trouble falling and staying asleep. She has tried takng a 12 mg capsule of melatonin at bedtime, but it has not helped. She has also taken 2 at a time, and this worked well. She asked if she can continue to do this.    ROS: See pertinent positives and negatives per HPI.  Past Medical History:  Diagnosis Date   Allergic rhinitis 01/23/2008   Angina pectoris 06/06/2016   Arthritis    Attention deficit hyperactivity disorder (ADHD) 02/07/2016   Back pain, lumbar 01/21/2009   Bell's palsy 02/06/2008   Bipolar disorder 12/13/2009   Chronic idiopathic constipation    Dizziness 04/16/2008   Edema, leg 01/21/2009   Epigastric pain    Essential hypertension 01/23/2008   Gastro-esophageal reflux disease without esophagitis 01/23/2008   Generalized anxiety disorder 01/23/2008   Headache 04/15/2007   Hidradenitis 10/04/2008   History of colonic polyps    Hyperlipidemia    Hypokalemia 07/23/2018   Insomnia 01/23/2008   Major depressive disorder 01/23/2008   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 Wynetta Emery   BREAST BIOPSY Right 1981   benign   BREAST EXCISIONAL BIOPSY Right 1981   benign   scar does not show   BREAST SURGERY Bilateral 12-11-12   reductions per Dr. Ivin Booty in [facility]    CATARACT EXTRACTION, BILATERAL Bilateral last summer of 2018   at [facility]   COLONOSCOPY  04/25/2016   per Dr. Loreta Ave, adenomatous polyps, repeat in 5 yrs    DRUG INDUCED ENDOSCOPY N/A 01/30/2019   Procedure: DRUG INDUCED ENDOSCOPY;  Surgeon: Christia Reading, MD;  Location: [facility];  Service: ENT;  Laterality: N/A;   ESOPHAGOGASTRODUODENOSCOPY  04-23-11   per Dr. Loreta Ave, normal    EYE SURGERY  06/18/2016   L eyelid    IMPLANTATION OF HYPOGLOSSAL NERVE STIMULATOR Right 03/25/2019   Procedure: IMPLANTATION OF HYPOGLOSSAL NERVE STIMULATOR;  Surgeon: Christia Reading, MD;  Location: [facility] OR;  Service: ENT;  Laterality: Right;   LUMBAR DISC SURGERY  2003   Dr. Wynetta Emery   MULTIPLE TOOTH EXTRACTIONS     ORIF WRIST FRACTURE Right 08/29/2018   REDUCTION MAMMAPLASTY Bilateral 2013   SPLIT NIGHT STUDY  11/11/2015  UVULOPALATOPHARYNGOPLASTY     VAGINAL HYSTERECTOMY  12/96   secondary to fibroids.    Family History  Problem Relation Age of Onset   Dementia Mother    Hyperlipidemia Mother    Asthma Father    Hypertension Sister    Hyperlipidemia Sister    Coronary artery disease Sister    Thyroid disease Sister    Hypertension Brother    Hyperlipidemia Brother    Coronary artery disease Brother    Hypertension Sister    Hyperlipidemia Sister    Alcohol abuse Other        fhx   Arthritis Other        fhx   Coronary artery disease Other        fhx   Depression  Other        fhx   Diabetes Other        fhx   Hyperlipidemia Other        fhx   Hypertension Other        fhx   Sudden death Other        fhx     Current Outpatient Medications:    acyclovir (ZOVIRAX) 400 MG tablet, TAKE 1 TABLET BY MOUTH 4 TIMES A DAY AS NEEDED, Disp: 120 tablet, Rfl: 5   alprazolam (XANAX) 2 MG tablet, TAKE 1 TABLET BY MOUTH 3 TIMES DAILY AS NEEDED FOR ANXIETY., Disp: 90 tablet, Rfl: 5   Ascorbic Acid (VITAMIN C) 1000 MG tablet, Take 1,000 mg by mouth daily., Disp: , Rfl:    azelastine (ASTELIN) 0.1 % nasal spray, PLACE 2 SPRAYS INTO BOTH NOSTRILS 2 TIMES DAILY AS DIRECTED (Patient taking differently: Place 1 spray into both nostrils daily.), Disp: 30 mL, Rfl: 11   Cholecalciferol (VITAMIN D3) 125 MCG (5000 UT) CAPS, Take 5,000 Units by mouth once a week., Disp: , Rfl:    FLUoxetine (PROZAC) 20 MG capsule, Take 1 capsule (20 mg total) by mouth daily., Disp: 90 capsule, Rfl: 3   Glucosamine-Chondroitin (OSTEO BI-FLEX REGULAR STRENGTH PO), Take 1 tablet by mouth daily., Disp: , Rfl:    Magnesium 500 MG CAPS, Take 500 mg by mouth daily., Disp: , Rfl:    melatonin 3 MG TABS tablet, Take 3 mg by mouth at bedtime., Disp: , Rfl:    Multiple Vitamin (MULTIVITAMIN WITH MINERALS) TABS tablet, Take 2 tablets by mouth daily., Disp: , Rfl:    omeprazole (PRILOSEC) 20 MG capsule, Take 1 capsule (20 mg total) by mouth at bedtime., Disp: 180 capsule, Rfl: 3   polyethylene glycol (MIRALAX / GLYCOLAX) 17 g packet, Take 17 g by mouth daily as needed., Disp: , Rfl:    potassium chloride (KLOR-CON) 10 MEQ tablet, TAKE 1 TABLET BY MOUTH DAILY., Disp: 30 tablet, Rfl: 2   Probiotic Product (PROBIOTIC PO), Take 1 capsule by mouth daily. , Disp: , Rfl:    simvastatin (ZOCOR) 80 MG tablet, TAKE 1 TABLET BY MOUTH AT BEDTIME., Disp: 90 tablet, Rfl: 3   VITAMIN E PO, Take 1 tablet by mouth once a week., Disp: , Rfl:   EXAM:  VITALS per patient if applicable:  GENERAL: alert, oriented,  appears well and in no acute distress  HEENT: atraumatic, conjunttiva clear, no obvious abnormalities on inspection of external nose and ears  NECK: normal movements of the head and neck  LUNGS: on inspection no signs of respiratory distress, breathing rate appears normal, no obvious gross SOB, gasping or wheezing  CV: no obvious cyanosis  MS:  moves all visible extremities without noticeable abnormality  PSYCH/NEURO: pleasant and cooperative, no obvious depression or anxiety, speech and thought processing grossly intact  ASSESSMENT AND PLAN: Insomnia. I told her to continue to take 2 capsules (total of 24 mg) of melatonin at  bedtime. This is safe and it does not conflict with her other medications. As for her bipolar depression, she is scheduled to begin psychotherapy with Dr. Viviann Spare [place]ibet in 2 weeks.  Gershon Crane, MD  Discussed the following assessment and plan:  No diagnosis found.     I discussed the assessment and treatment plan with the patient. The patient was provided an opportunity to ask questions and all were answered. The patient agreed with the plan and demonstrated an understanding of the instructions.   The patient was advised to call back or seek an in-person evaluation if the symptoms worsen or if the condition fails to improve as anticipated.      Review of Systems     Objective:   Physical Exam        Assessment & Plan:

## 2021-03-07 HISTORY — PX: COLONOSCOPY: SHX174

## 2021-03-08 ENCOUNTER — Telehealth: Payer: Self-pay | Admitting: Pharmacist

## 2021-03-08 NOTE — Chronic Care Management (AMB) (Signed)
Chronic Care Management Pharmacy Assistant   Name: [name] [name]  MRN: [id number] DOB: 1955/11/16  Reason for Encounter: General Assessment Call    Conditions to be addressed/monitored: HTN and HLD  Recent office visits:  02-28-2021 Nelwyn Salisbury, MD - Patient presented for Insomnia and other concerns. Changed Acyclovir To 400 mg 4 times a day as needed Stopped B Complex Vitamin  01-25-2021 Nelwyn Salisbury, MD - Patient presented for Anxiety and other concerns. Changed fluoxetine to 20 mg. Changed Omeprazole to 20 mg at bedtime.Stopped Amphetamine and Hydroxyzine.   Recent consult visits:  02-16-2021 Rosann Auerbach, PhD (Psychology) - Patient presented for Cognitive deficits and other concerns. No medication changes.   02-09-2021  Rosann Auerbach, PhD (Psychology) - Patient presented for Cognitive deficits and other concerns. No medication changes.   Hospital visits:  None in previous 6 months  Medications: Outpatient Encounter Medications as of 03/08/2021  Medication Sig   acyclovir (ZOVIRAX) 400 MG tablet TAKE 1 TABLET BY MOUTH 4 TIMES A DAY AS NEEDED   alprazolam (XANAX) 2 MG tablet TAKE 1 TABLET BY MOUTH 3 TIMES DAILY AS NEEDED FOR ANXIETY.   Ascorbic Acid (VITAMIN C) 1000 MG tablet Take 1,000 mg by mouth daily.   azelastine (ASTELIN) 0.1 % nasal spray PLACE 2 SPRAYS INTO BOTH NOSTRILS 2 TIMES DAILY AS DIRECTED (Patient taking differently: Place 1 spray into both nostrils daily.)   Cholecalciferol (VITAMIN D3) 125 MCG (5000 UT) CAPS Take 5,000 Units by mouth once a week.   FLUoxetine (PROZAC) 20 MG capsule Take 1 capsule (20 mg total) by mouth daily.   Glucosamine-Chondroitin (OSTEO BI-FLEX REGULAR STRENGTH PO) Take 1 tablet by mouth daily.   Magnesium 500 MG CAPS Take 500 mg by mouth daily.   melatonin 3 MG TABS tablet Take 3 mg by mouth at bedtime.   Multiple Vitamin (MULTIVITAMIN WITH MINERALS) TABS tablet Take 2 tablets by mouth daily.   omeprazole (PRILOSEC) 20 MG  capsule Take 1 capsule (20 mg total) by mouth at bedtime.   polyethylene glycol (MIRALAX / GLYCOLAX) 17 g packet Take 17 g by mouth daily as needed.   potassium chloride (KLOR-CON) 10 MEQ tablet TAKE 1 TABLET BY MOUTH DAILY.   Probiotic Product (PROBIOTIC PO) Take 1 capsule by mouth daily.    simvastatin (ZOCOR) 80 MG tablet TAKE 1 TABLET BY MOUTH AT BEDTIME.   VITAMIN E PO Take 1 tablet by mouth once a week.   No facility-administered encounter medications on file as of 03/08/2021.  Reviewed chart prior to disease state call. Spoke with patient regarding BP  Recent Office Vitals: BP Readings from Last 3 Encounters:  11/11/20 116/75  10/17/20 126/78  08/16/20 108/68   Pulse Readings from Last 3 Encounters:  11/11/20 79  10/17/20 77  08/16/20 75    Wt Readings from Last 3 Encounters:  11/11/20 159 lb 9.6 oz (72.4 kg)  10/17/20 163 lb (73.9 kg)  08/16/20 161 lb (73 kg)     Kidney Function Lab Results  Component Value Date/Time   CREATININE 0.91 10/17/2020 01:54 PM   CREATININE 0.75 07/28/2020 07:53 PM   CREATININE 0.67 03/17/2020 11:52 AM   CREATININE 0.68 11/08/2017 04:34 PM   GFR 66.28 10/17/2020 01:54 PM   GFRNONAA >60 07/28/2020 07:53 PM   GFRAA >60 03/23/2019 01:55 PM    BMP Latest Ref Rng & Units 10/17/2020 07/28/2020 03/17/2020  Glucose 70 - 99 mg/dL 81 237(S) 98  BUN 6 - 23 mg/dL 10 15  13  Creatinine 0.40 - 1.20 mg/dL 7.98 9.21 1.94  BUN/Creat Ratio 6 - 22 (calc) - - NOT APPLICABLE  Sodium 135 - 145 mEq/L 136 140 141  Potassium 3.5 - 5.1 mEq/L 4.1 2.8(L) 4.1  Chloride 96 - 112 mEq/L 99 100 103  CO2 19 - 32 mEq/L 30 27 31   Calcium 8.4 - 10.5 mg/dL 9.6 9.9 9.8    Current antihypertensive regimen:  None How often are you checking your Blood Pressure? Patient reports she is checking at home irregularly Current home BP readings: 117/80 What recent interventions/DTPs have been made by any provider to improve Blood Pressure control since last CPP Visit: Patient  reports none Any recent hospitalizations or ED visits since last visit with CPP? None   Adherence Review: Is the patient currently on ACE/ARB medication? No Does the patient have >5 day gap between last estimated fill dates? No  03/08/2021 Name: [name] [name] MRN: [name] [name] DOB: March 21, 1956 [name] [name] is a 65 y.o. year old female who is a primary care patient of 75, MD.  Comprehensive medication review performed; Spoke to patient regarding cholesterol  Lipid Panel    Component Value Date/Time   CHOL 202 (H) 03/17/2020 1152   TRIG 109 03/17/2020 1152   HDL 56 03/17/2020 1152   LDLCALC 124 (H) 03/17/2020 1152   LDLDIRECT 146.0 03/10/2018 1035    10-year ASCVD risk score: The 10-year ASCVD risk score (Arnett DK, et al., 2019) is: 8.4%   Values used to calculate the score:     Age: 21 years     Sex: Female     Is Non-Hispanic African American: No     Diabetic: No     Tobacco smoker: Yes     Systolic Blood Pressure: 116 mmHg     Is BP treated: No     HDL Cholesterol: 56 mg/dL     Total Cholesterol: 202 mg/dL  Current antihyperlipidemic regimen:  simvastatin 80mg , 1 tablet once daily  ASCVD risk enhancing conditions: HTN What recent interventions/DTPs have been made by any provider to improve Cholesterol control since last CPP Visit: Patient reports none Any recent hospitalizations or ED visits since last visit with CPP? None  Adherence Review: Does the patient have >5 day gap between last estimated fill dates? No    Care Gaps: COVID Vaccines - Overdue Zoster Vaccines - Overdue PAP Spear - Overdue Mammogram  - Overdue DEXA - Overdue Flu Vaccine - Overdue CCM 05-15-21  Star Rating Drugs: Simvastatin (Zocor) 80 mg - Last filled 01-28-2021 90 DS  Notes: Patient reports she is no longer taking a lot of her pain medication and is feeling so much better, some pain but better. She reports she has to have a colonoscopy done and will call Dr 05-17-21 office  to schedule her blood work needed prior. She reports no other concerns or issues. Advised of her follow up appointment with 01-30-2021 she was in agreement.  Claris Che CMA Clinical Pharmacist Assistant [phone]

## 2021-03-09 ENCOUNTER — Ambulatory Visit: Payer: PPO | Admitting: Neurology

## 2021-03-09 DIAGNOSIS — Z86010 Personal history of colonic polyps: Secondary | ICD-10-CM | POA: Diagnosis not present

## 2021-03-09 DIAGNOSIS — Z1211 Encounter for screening for malignant neoplasm of colon: Secondary | ICD-10-CM | POA: Diagnosis not present

## 2021-03-09 DIAGNOSIS — K219 Gastro-esophageal reflux disease without esophagitis: Secondary | ICD-10-CM | POA: Diagnosis not present

## 2021-03-09 DIAGNOSIS — K5900 Constipation, unspecified: Secondary | ICD-10-CM | POA: Diagnosis not present

## 2021-03-14 DIAGNOSIS — H40033 Anatomical narrow angle, bilateral: Secondary | ICD-10-CM | POA: Diagnosis not present

## 2021-03-14 DIAGNOSIS — H43393 Other vitreous opacities, bilateral: Secondary | ICD-10-CM | POA: Diagnosis not present

## 2021-03-30 ENCOUNTER — Ambulatory Visit: Payer: PPO | Admitting: Psychology

## 2021-04-27 ENCOUNTER — Other Ambulatory Visit: Payer: Self-pay | Admitting: Family Medicine

## 2021-04-27 DIAGNOSIS — J309 Allergic rhinitis, unspecified: Secondary | ICD-10-CM

## 2021-05-01 ENCOUNTER — Encounter: Payer: Self-pay | Admitting: Family Medicine

## 2021-05-01 ENCOUNTER — Ambulatory Visit (INDEPENDENT_AMBULATORY_CARE_PROVIDER_SITE_OTHER): Payer: PPO | Admitting: Family Medicine

## 2021-05-01 ENCOUNTER — Other Ambulatory Visit: Payer: Self-pay | Admitting: Family Medicine

## 2021-05-01 ENCOUNTER — Other Ambulatory Visit: Payer: Self-pay

## 2021-05-01 VITALS — BP 140/90 | HR 71 | Temp 98.4°F | Wt 171.2 lb

## 2021-05-01 DIAGNOSIS — E538 Deficiency of other specified B group vitamins: Secondary | ICD-10-CM

## 2021-05-01 DIAGNOSIS — R911 Solitary pulmonary nodule: Secondary | ICD-10-CM | POA: Diagnosis not present

## 2021-05-01 DIAGNOSIS — F900 Attention-deficit hyperactivity disorder, predominantly inattentive type: Secondary | ICD-10-CM

## 2021-05-01 DIAGNOSIS — I10 Essential (primary) hypertension: Secondary | ICD-10-CM | POA: Diagnosis not present

## 2021-05-01 DIAGNOSIS — J309 Allergic rhinitis, unspecified: Secondary | ICD-10-CM

## 2021-05-01 DIAGNOSIS — R739 Hyperglycemia, unspecified: Secondary | ICD-10-CM

## 2021-05-01 DIAGNOSIS — F314 Bipolar disorder, current episode depressed, severe, without psychotic features: Secondary | ICD-10-CM

## 2021-05-01 DIAGNOSIS — R0602 Shortness of breath: Secondary | ICD-10-CM | POA: Diagnosis not present

## 2021-05-01 DIAGNOSIS — R935 Abnormal findings on diagnostic imaging of other abdominal regions, including retroperitoneum: Secondary | ICD-10-CM

## 2021-05-01 LAB — HEPATIC FUNCTION PANEL
ALT: 21 U/L (ref 0–35)
AST: 26 U/L (ref 0–37)
Albumin: 4.7 g/dL (ref 3.5–5.2)
Alkaline Phosphatase: 63 U/L (ref 39–117)
Bilirubin, Direct: 0 mg/dL (ref 0.0–0.3)
Total Bilirubin: 0.3 mg/dL (ref 0.2–1.2)
Total Protein: 8 g/dL (ref 6.0–8.3)

## 2021-05-01 LAB — CBC WITH DIFFERENTIAL/PLATELET
Basophils Absolute: 0.1 10*3/uL (ref 0.0–0.1)
Basophils Relative: 1.1 % (ref 0.0–3.0)
Eosinophils Absolute: 0.2 10*3/uL (ref 0.0–0.7)
Eosinophils Relative: 3 % (ref 0.0–5.0)
HCT: 38.4 % (ref 36.0–46.0)
Hemoglobin: 12.9 g/dL (ref 12.0–15.0)
Lymphocytes Relative: 31.1 % (ref 12.0–46.0)
Lymphs Abs: 2.1 10*3/uL (ref 0.7–4.0)
MCHC: 33.5 g/dL (ref 30.0–36.0)
MCV: 92.3 fl (ref 78.0–100.0)
Monocytes Absolute: 0.5 10*3/uL (ref 0.1–1.0)
Monocytes Relative: 7.2 % (ref 3.0–12.0)
Neutro Abs: 3.9 10*3/uL (ref 1.4–7.7)
Neutrophils Relative %: 57.6 % (ref 43.0–77.0)
Platelets: 178 10*3/uL (ref 150.0–400.0)
RBC: 4.16 Mil/uL (ref 3.87–5.11)
RDW: 13.9 % (ref 11.5–15.5)
WBC: 6.7 10*3/uL (ref 4.0–10.5)

## 2021-05-01 LAB — LIPID PANEL
Cholesterol: 240 mg/dL — ABNORMAL HIGH (ref 0–200)
HDL: 65.1 mg/dL (ref >39.00)
LDL Cholesterol: 149 mg/dL — ABNORMAL HIGH (ref 0–99)
NonHDL: 175.14
Total CHOL/HDL Ratio: 4
Triglycerides: 131 mg/dL (ref 0.0–149.0)
VLDL: 26.2 mg/dL (ref 0.0–40.0)

## 2021-05-01 LAB — BASIC METABOLIC PANEL
BUN: 24 mg/dL — ABNORMAL HIGH (ref 6–23)
CO2: 32 mEq/L (ref 19–32)
Calcium: 10.2 mg/dL (ref 8.4–10.5)
Chloride: 101 mEq/L (ref 96–112)
Creatinine, Ser: 0.75 mg/dL (ref 0.40–1.20)
GFR: 83.28 mL/min (ref >60.00)
Glucose, Bld: 81 mg/dL (ref 70–99)
Potassium: 5.5 mEq/L — ABNORMAL HIGH (ref 3.5–5.1)
Sodium: 140 mEq/L (ref 135–145)

## 2021-05-01 LAB — VITAMIN B12: Vitamin B-12: 551 pg/mL (ref 211–911)

## 2021-05-01 LAB — TSH: TSH: 2.46 u[IU]/mL (ref 0.35–5.50)

## 2021-05-01 LAB — HEMOGLOBIN A1C: Hgb A1c MFr Bld: 6.1 % (ref 4.6–6.5)

## 2021-05-01 MED ORDER — FLUOXETINE HCL 40 MG PO CAPS: 40.0000 mg | ORAL_CAPSULE | Freq: Every day | ORAL | 3 refills | Status: DC

## 2021-05-01 MED ORDER — AMPHETAMINE-DEXTROAMPHET ER 10 MG PO CP24: 10.0000 mg | ORAL_CAPSULE | Freq: Every day | ORAL | 0 refills | Status: DC

## 2021-05-01 NOTE — Progress Notes (Signed)
   Subjective:    Patient ID: [name] [name], female    DOB: 09-19-1955, 65 y.o.   MRN: [id number]  HPI Here to discuss several issues. First she is concerned about her BP. She saw Dr. Loreta Ave recently and this was mildly high at that time. She does not check it at home.  She has gained about 12 lbs of weight in the past few months. She has been dealing with more anxiety lately, primarily caused by family issues. She asks to increase her Prozac dose. Also she had stopped taking Adderall due ot side effects like nervousness and poor sleep. She had been up to 30 mg daily of this, but she stopped it a month ago. She stll needs it to focus on tasks, bur she asksk to lower the dose.   Review of Systems  Constitutional: Negative.   Respiratory: Negative.    Cardiovascular: Negative.   Neurological: Negative.   Psychiatric/Behavioral:  Positive for decreased concentration. Negative for agitation, behavioral problems, confusion, dysphoric mood and hallucinations. The patient is nervous/anxious.       Objective:   Physical Exam Constitutional:      Appearance: Normal appearance.  Cardiovascular:     Rate and Rhythm: Normal rate and regular rhythm.     Pulses: Normal pulses.     Heart sounds: Normal heart sounds.  Pulmonary:     Effort: Pulmonary effort is normal.     Breath sounds: Normal breath sounds.  Musculoskeletal:     Right lower leg: No edema.     Left lower leg: No edema.  Neurological:     General: No focal deficit present.     Mental Status: She is alert and oriented to person, place, and time.  Psychiatric:        Mood and Affect: Mood normal.        Behavior: Behavior normal.        Thought Content: Thought content normal.          Assessment & Plan:  She does have some mild HTN. We will see her back in 3-4 weeks to recheck this, and I asked her to follow this at home as well. For the Bipolar disorder and anxiety we will increae the Prozac to 40 mg daily. For the ADHD  we will start her back on Adderall XR 10 mg daily. We spent a total of ( 33  ) minutes reviewing records and discussing these issues.  Gershon Crane, MD

## 2021-05-01 NOTE — Addendum Note (Signed)
Addended by: Marian Sorrow D on: 05/01/2021 10:15 AM   Modules accepted: Orders

## 2021-05-01 NOTE — Progress Notes (Signed)
   Subjective:    Patient ID: [name] [name], female    DOB: 1955/06/25, 65 y.o.   MRN: [id number]  HPI    Review of Systems     Objective:   Physical Exam        Assessment & Plan:

## 2021-05-03 ENCOUNTER — Telehealth: Payer: Self-pay | Admitting: Family Medicine

## 2021-05-03 NOTE — Telephone Encounter (Signed)
Patient called because pharmacy states the prescription for adderal hasn't been approved. They have the prescription but it hasn't been approved so they cannot fill it. Patient states she would call insurance to make sure but wanted to have message sent back to see if it is anything on Dr.Fry's end      Good callback number is [phone]   Please advise

## 2021-05-04 NOTE — Telephone Encounter (Signed)
Spoke with pt aware that her Adderall PA was approved by her insurance

## 2021-05-08 ENCOUNTER — Telehealth: Payer: Self-pay | Admitting: Family Medicine

## 2021-05-08 DIAGNOSIS — R0602 Shortness of breath: Secondary | ICD-10-CM

## 2021-05-08 NOTE — Telephone Encounter (Addendum)
Pt is calling and would like a ct scan of lung due to she has stop smoking. Pt had life line screening test and she may have COPD. Pt will mail the results to dr fry. Pt is aware md out of office this week

## 2021-05-08 NOTE — Telephone Encounter (Signed)
Please advise 

## 2021-05-10 ENCOUNTER — Telehealth: Payer: Self-pay | Admitting: Pharmacist

## 2021-05-10 NOTE — Chronic Care Management (AMB) (Signed)
    Chronic Care Management Pharmacy Assistant   Name: [name] [name]  MRN: [id number] DOB: [birth year]  05/15/21 APPOINTMENT REMINDER   Patient was reminded to have all medications, supplements and any blood glucose and blood pressure readings available for review with Gaylord Shih, Pharm. D, for telephone visit on 05/15/21 at 1.  Care Gaps: BP - 140/90 (05/01/21) COVID Vaccines - Overdue Zoster Vaccines - Overdue PAP Spear - Overdue Mammogram  - Overdue DEXA - Overdue Flu Vaccine - Overdue AVW - 2/22  Star Rating Drug: Simvastatin (Zocor) 80 mg - Last filled 01/28/2021 90 DS at [facility]  Any gaps in medications fill history? Call to Pharmacy to verify the above date is accurate  Medications: Outpatient Encounter Medications as of 05/10/2021  Medication Sig   acyclovir (ZOVIRAX) 400 MG tablet TAKE 1 TABLET BY MOUTH 4 TIMES A DAY AS NEEDED   alprazolam (XANAX) 2 MG tablet TAKE 1 TABLET BY MOUTH 3 TIMES DAILY AS NEEDED FOR ANXIETY.   amphetamine-dextroamphetamine (ADDERALL XR) 10 MG 24 hr capsule Take 1 capsule (10 mg total) by mouth daily.   Ascorbic Acid (VITAMIN C) 1000 MG tablet Take 1,000 mg by mouth daily.   azelastine (ASTELIN) 0.1 % nasal spray PLACE 2 SPRAYS INTO BOTH NOSTRILS 2 TIMES DAILY AS DIRECTED   Cholecalciferol (VITAMIN D3) 125 MCG (5000 UT) CAPS Take 5,000 Units by mouth once a week.   FLUoxetine (PROZAC) 40 MG capsule Take 1 capsule (40 mg total) by mouth daily.   Glucosamine-Chondroitin (OSTEO BI-FLEX REGULAR STRENGTH PO) Take 1 tablet by mouth daily.   Magnesium 500 MG CAPS Take 500 mg by mouth daily.   melatonin 3 MG TABS tablet Take 3 mg by mouth at bedtime.   Multiple Vitamin (MULTIVITAMIN WITH MINERALS) TABS tablet Take 2 tablets by mouth daily.   omeprazole (PRILOSEC) 20 MG capsule Take 1 capsule (20 mg total) by mouth at bedtime.   polyethylene glycol (MIRALAX / GLYCOLAX) 17 g packet Take 17 g by mouth daily as needed.   potassium  chloride (KLOR-CON) 10 MEQ tablet TAKE 1 TABLET BY MOUTH DAILY.   Probiotic Product (PROBIOTIC PO) Take 1 capsule by mouth daily.    simvastatin (ZOCOR) 80 MG tablet TAKE 1 TABLET BY MOUTH AT BEDTIME.   VITAMIN E PO Take 1 tablet by mouth once a week.   No facility-administered encounter medications on file as of 05/10/2021.    Pamala Duffel CMA Clinical Pharmacist Assistant [phone]

## 2021-05-14 NOTE — Progress Notes (Signed)
Chronic Care Management Pharmacy Note  05/16/2021 Name:  [name] [name] MRN:  [id number] DOB:  1956-02-15  Summary: LDL not at goal < 100   Recommendations/Changes made from today's visit: -Recommended bringing BP cuff to next office visit or fire department to ensure accuracy -Recommended taking Tylenol instead of Bayer back and body -Recommended switching simvastatin to rosuvastatin for LDL lowering    Plan: BP assessment in 1-2 months  Subjective: [name] [name] is an 65 y.o. year old female who is a primary patient of Laurey Morale, MD.  The CCM team was consulted for assistance with disease management and care coordination needs.    Engaged with patient by telephone for follow up visit in response to provider referral for pharmacy case management and/or care coordination services.   Consent to Services:  The patient was given information about Chronic Care Management services, agreed to services, and gave verbal consent prior to initiation of services.  Please see initial visit note for detailed documentation.   Patient Care Team: Laurey Morale, MD as PCP - General Sueanne Margarita, MD as Consulting Physician (Cardiology) Viona Gilmore, [facility] as Pharmacist (Pharmacist)  Recent office visits: 05/01/21 [name] Penna, MD: Patient presented for elevated BP and anxiety follow up. Increased Prozac to 40 mg daily and started Adderall XR 10 mg daily for ADHD.  02-28-2021 Laurey Morale, MD - Patient presented for Insomnia and other concerns. Changed Acyclovir To 400 mg 4 times a day as needed Stopped B Complex Vitamin   01-25-2021 Laurey Morale, MD - Patient presented for Anxiety and other concerns. Changed fluoxetine to 20 mg. Changed Omeprazole to 20 mg at bedtime.Stopped Amphetamine and Hydroxyzine.   Recent consult visits: 03/14/21 My Margret Chance (optometry): Unable to access notes.  03/09/21 Jyothi Mann (gastro): Unable to access notes.  02-16-2021 Hazle Coca, PhD  (Psychology) - Patient presented for Cognitive deficits and other concerns. No medication changes.    02-09-2021  Hazle Coca, PhD (Psychology) - Patient presented for Cognitive deficits and other concerns. No medication changes.   11/11/20 Metta Clines, DO (neurology): Patient presented for chronic dizziness and memory deficits. Referral placed for neuropsychology.  08/23/20 Linda Hedges (OBGYN): Patient presented for follow up.  Hospital visits: 07/28/20 Patient presented to the ED for suicidal thoughts.  Objective:  Lab Results  Component Value Date   CREATININE 0.75 05/01/2021   BUN 24 (H) 05/01/2021   GFR 83.28 05/01/2021   GFRNONAA >60 07/28/2020   GFRAA >60 03/23/2019   NA 140 05/01/2021   K 5.5 No hemolysis seen (H) 05/01/2021   CALCIUM 10.2 05/01/2021   CO2 32 05/01/2021   GLUCOSE 81 05/01/2021    Lab Results  Component Value Date/Time   HGBA1C 6.1 05/01/2021 10:15 AM   HGBA1C 5.5 03/17/2020 11:52 AM   GFR 83.28 05/01/2021 10:15 AM   GFR 66.28 10/17/2020 01:54 PM    Last diabetic Eye exam: No results found for: HMDIABEYEEXA  Last diabetic Foot exam: No results found for: HMDIABFOOTEX   Lab Results  Component Value Date   CHOL 240 (H) 05/01/2021   HDL 65.10 05/01/2021   LDLCALC 149 (H) 05/01/2021   LDLDIRECT 146.0 03/10/2018   TRIG 131.0 05/01/2021   CHOLHDL 4 05/01/2021    Hepatic Function Latest Ref Rng & Units 05/01/2021 07/28/2020 03/17/2020  Total Protein 6.0 - 8.3 g/dL 8.0 8.4(H) 7.3  Albumin 3.5 - 5.2 g/dL 4.7 4.5 -  AST 0 - 37 U/L 26 58(H) 27  ALT 0 - 35 U/L _0 Alk Phosphatase 39 - 117 U/L 63 65 -  Total Bilirubin 0.2 - 1.2 mg/dL 0.3 0.9 0.5  Bilirubin, Direct 0.0 - 0.3 mg/dL 0.0 - 0.1    Lab Results  Component Value Date/Time   TSH 2.46 05/01/2021 10:15 AM   TSH 0.98 03/17/2020 11:52 AM    CBC Latest Ref Rng & Units 05/01/2021 07/28/2020 03/17/2020  WBC 4.0 - 10.5 K/uL 6.7 11.2(H) 5.4  Hemoglobin 12.0 - 15.0 g/dL 12.9 12.9 12.8   Hematocrit 36.0 - 46.0 % 38.4 38.6 38.3  Platelets 150.0 - 400.0 K/uL 178.0 211 184    Lab Results  Component Value Date/Time   VD25OH 54 03/17/2020 11:52 AM   VD25OH 52 04/20/2013 02:59 PM    Clinical ASCVD: No  The 10-year ASCVD risk score (Arnett DK, et al., 2019) is: 12.3%   Values used to calculate the score:     Age: 65 years     Sex: Female     Is Non-Hispanic African American: No     Diabetic: No     Tobacco smoker: Yes     Systolic Blood Pressure: 448 mmHg     Is BP treated: No     HDL Cholesterol: 65.1 mg/dL     Total Cholesterol: 240 mg/dL    Depression screen [facility] 2/9 07/20/2020 06/08/2020 07/14/2019  Decreased Interest 0 3 0  Down, Depressed, Hopeless 0 3 1  PHQ - 2 Score 0 6 1  Altered sleeping - 1 -  Tired, decreased energy - 3 -  Change in appetite - 3 -  Feeling bad or failure about yourself  - 3 -  Trouble concentrating - 2 -  Moving slowly or fidgety/restless - 1 -  Suicidal thoughts - 1 -  PHQ-9 Score - 20 -  Difficult doing work/chores - Very difficult -  Some recent data might be hidden      Social History   Tobacco Use  Smoking Status Former   Years: 30.00   Types: Cigarettes   Quit date: 12/2020   Years since quitting: 0.4  Smokeless Tobacco Never   BP Readings from Last 3 Encounters:  05/01/21 140/90  11/11/20 116/75  10/17/20 126/78   Pulse Readings from Last 3 Encounters:  05/01/21 71  11/11/20 79  10/17/20 77   Wt Readings from Last 3 Encounters:  05/01/21 171 lb 4 oz (77.7 kg)  11/11/20 159 lb 9.6 oz (72.4 kg)  10/17/20 163 lb (73.9 kg)   BMI Readings from Last 3 Encounters:  05/01/21 31.32 kg/m  11/11/20 29.19 kg/m  10/17/20 29.34 kg/m    Assessment/Interventions: Review of patient past medical history, allergies, medications, health status, including review of consultants reports, laboratory and other test data, was performed as part of comprehensive evaluation and provision of chronic care management services.    SDOH:  (Social Determinants of Health) assessments and interventions performed: No  SDOH Screenings   Alcohol Screen: Not on file  Depression (PHQ2-9): Low Risk    PHQ-2 Score: 0  Financial Resource Strain: Low Risk    Difficulty of Paying Living Expenses: Not hard at all  Food Insecurity: No Food Insecurity   Worried About Charity fundraiser in the Last Year: Never true   Ran Out of Food in the Last Year: Never true  Housing: Low Risk    Last Housing Risk Score: 0  Physical Activity: Inactive   Days of Exercise per Week: 0  days   Minutes of Exercise per Session: 0 min  Social Connections: Moderately Isolated   Frequency of Communication with Friends and Family: More than three times a week   Frequency of Social Gatherings with Friends and Family: Never   Attends Religious Services: More than 4 times per year   Active Member of [employer] or Organizations: No   Attends Music therapist: Never   Marital Status: Separated  Stress: No Stress Concern Present   Feeling of Stress : Not at all  Tobacco Use: Medium Risk   Smoking Tobacco Use: Former   Smokeless Tobacco Use: Never   Passive Exposure: Not on file  Transportation Needs: No Transportation Needs   Lack of Transportation (Medical): No   Lack of Transportation (Non-Medical): No    CCM Care Plan  Allergies  Allergen Reactions   Sulfonamide Derivatives Swelling    Facial swelling   Flexeril [Cyclobenzaprine] Other (See Comments)    Dizziness, made meniere's disease worse    Hydromorphone Itching   Morphine And Related Nausea And Vomiting   Sulfamethoxazole Other (See Comments)    Causes swelling     Medications Reviewed Today     Reviewed by Viona Gilmore, [facility] (Pharmacist) on 05/16/21 at [place] List Status: <None>   Medication Order Taking? Sig Documenting Provider Last Dose Status Informant  acyclovir (ZOVIRAX) 400 MG tablet [id number] No TAKE 1 TABLET BY MOUTH 4 TIMES A DAY AS NEEDED Laurey Morale, MD Taking Active   alprazolam [name] 2 MG tablet [id number] No TAKE 1 TABLET BY MOUTH 3 TIMES DAILY AS NEEDED FOR ANXIETY. Laurey Morale, MD Taking Active   amphetamine-dextroamphetamine (ADDERALL XR) 10 MG 24 hr capsule [id number]  Take 1 capsule (10 mg total) by mouth daily. Laurey Morale, MD  Active   Ascorbic Acid (VITAMIN C) 1000 MG tablet [id number] No Take 1,000 mg by mouth daily. [provider] Taking Active Multiple Informants  azelastine (ASTELIN) 0.1 % nasal spray [id number]  PLACE 2 SPRAYS INTO BOTH NOSTRILS 2 TIMES DAILY AS DIRECTED Laurey Morale, MD  Active   Cholecalciferol (VITAMIN D3) 125 MCG (5000 UT) CAPS [id number] No Take 5,000 Units by mouth once a week. [provider] Taking Active Multiple Informants  FLUoxetine (PROZAC) 40 MG capsule [id number]  Take 1 capsule (40 mg total) by mouth daily. Laurey Morale, MD  Active   Glucosamine-Chondroitin (OSTEO BI-FLEX REGULAR STRENGTH PO) [id number] No Take 1 tablet by mouth daily. [provider] Taking Active   Magnesium 500 MG CAPS [id number] No Take 500 mg by mouth daily. [provider] Taking Active Multiple Informants  melatonin 3 MG TABS tablet [id number] No Take 3 mg by mouth at bedtime. [provider] Taking Active Multiple Informants  Multiple Vitamin (MULTIVITAMIN WITH MINERALS) TABS tablet [id number] No Take 2 tablets by mouth daily. [provider] Taking Active Multiple Informants  omeprazole (PRILOSEC) 20 MG capsule [id number] No Take 1 capsule (20 mg total) by mouth at bedtime. Laurey Morale, MD Taking Active   polyethylene glycol (MIRALAX / GLYCOLAX) 17 g packet [id number] No Take 17 g by mouth daily as needed. [provider] Taking Active Multiple Informants  potassium chloride (KLOR-CON) 10 MEQ tablet [id number] No TAKE 1 TABLET BY MOUTH DAILY. Laurey Morale, MD Taking Active   Probiotic Product (PROBIOTIC PO) [id number] No Take 1 capsule by mouth  daily.  [provider] Taking Active Multiple Informants  simvastatin (ZOCOR) 80 MG tablet  [id number] No TAKE 1 TABLET BY MOUTH AT BEDTIME. Laurey Morale, MD Taking Active   VITAMIN E PO [id number] No Take 1 tablet by mouth once a week. [provider] Taking Active Multiple Informants            Patient Active Problem List   Diagnosis Date Noted   Chronic idiopathic constipation    Epigastric pain    History of colonic polyps    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   Hypokalemia 07/23/2018   Neck pain 05/06/2017   Angina pectoris 06/06/2016   PUD (peptic ulcer disease) 06/06/2016   Attention deficit hyperactivity disorder (ADHD) 02/07/2016   Obesity (BMI 30-39.9) 11/10/2015   Bipolar disorder 12/13/2009   Back pain, lumbar 01/21/2009   Edema, leg 01/21/2009   Hidradenitis 10/04/2008   Dizziness 04/16/2008   Bell's palsy 02/06/2008   Generalized anxiety disorder 01/23/2008   Major depressive disorder 01/23/2008   Essential hypertension 01/23/2008   Allergic rhinitis 01/23/2008   Gastro-esophageal reflux disease without esophagitis 01/23/2008   Insomnia 01/23/2008   Unspecified urinary incontinence 01/23/2008   Hyperlipidemia 04/15/2007   OSA (obstructive sleep apnea) 04/15/2007   Headache 04/15/2007    Immunization History  Administered Date(s) Administered   Influenza Split 04/26/2011   Influenza Whole 03/23/2010   Influenza,inj,Quad PF,6+ Mos 03/20/2013, 03/02/2015, 03/26/2016, 03/25/2017, 05/08/2019, 03/17/2020   Influenza-Unspecified 05/26/2014, 03/03/2018   Pneumococcal Conjugate-13 04/19/2016   Pneumococcal Polysaccharide-23 03/07/2018   Tdap 04/20/2013   Zoster Recombinat (Shingrix) 02/18/2019   Zoster, Live 03/26/2016   Patient has a colonoscopy scheduled for Friday and is anxious about this.   Patient reports she is feeling much better overall with her anxiety and just general health. She has not smoked going on 4-5 months. Patient is feeling  much better since stopping smoking.  Patient reports she was very surprised by her increased cholestolerol. She is not eating hamburgers, french fries and is air frying seafood.  Conditions to be addressed/monitored:  Hypertension, Hyperlipidemia, GERD, Depression, Anxiety, Allergic Rhinitis and ADHD and back pain  Conditions addressed this visit: Hypertension, hyperlipidemia, anxiety  Care Plan : CCM Pharmacy Care Plan  Updates made by Viona Gilmore, [place] since 05/16/2021 12:00 AM     Problem: Problem: Hypertension, Hyperlipidemia, GERD, Depression, Anxiety, Allergic Rhinitis and ADHD and back pain      Long-Range Goal: Patient-Specific Goal   Start Date: 09/21/2020  Expected End Date: 09/21/2021  Recent Progress: On track  Priority: High  Note:   Current Barriers:  Unable to independently monitor therapeutic efficacy Unable to achieve control of cholesterol  Unable to self administer medications as prescribed   Pharmacist Clinical Goal(s):  Patient will achieve adherence to monitoring guidelines and medication adherence to achieve therapeutic efficacy achieve control of cholesterol as evidenced by next lipid panel  through collaboration with PharmD and provider.   Interventions: 1:1 collaboration with Laurey Morale, MD regarding development and update of comprehensive plan of care as evidenced by provider attestation and co-signature Inter-disciplinary care team collaboration (see longitudinal plan of care) Comprehensive medication review performed; medication list updated in electronic medical record  Hypertension (BP goal <140/90) -Controlled -Current treatment: No medications -Medications previously tried: n/a  -Current home readings: 136/78, 133/101, 132/78, 155/73, 132/79 (brand new arm cuff) -Current dietary habits: been eating more fruit and vegetables and not eating out at all -Current exercise habits: would like to start walking and recommended trying yoga on  youtube to improve balance -Denies hypotensive/hypertensive symptoms -Educated on Daily salt intake goal <  2300 mg; Exercise goal of 150 minutes per week; -Counseled to monitor BP at home as directed, document, and provide log at future appointments -Counseled on diet and exercise extensively  Hyperlipidemia: (LDL goal < 100) -Uncontrolled -Current treatment: simvastatin 24m, 1 tablet once daily  -Medications previously tried: none  -Current dietary patterns: doing much better with diet; not eating out at all and eating lots of fruits. Seafood and stir fry vegetables -Current exercise habits: no consistent exercise -Educated on Cholesterol goals;  Benefits of statin for ASCVD risk reduction; Importance of limiting foods high in cholesterol; Exercise goal of 150 minutes per week; -Counseled on diet and exercise extensively Recommended to switch to high intensity statin therapy  Depression/Anxiety/bipolar disorder (Goal: minimize symptoms) -Not ideally controlled -Current treatment: fluoxetine 40 mg, 1 tablet once daily alprazolam 229m 1 tablet three times daily as needed (patient reports sometimes not taking once a day) -Medications previously tried/failed: Seroquel, Lithium -PHQ9: 0 -GAD7: n/a -Connected with [facility] number ([phone] mental health support -Educated on Benefits of medication for symptom control Benefits of cognitive-behavioral therapy with or without medication -Recommended calling behavioral health to set up an appointment  Insomnia (Goal: improve quality and quantity of sleep) -Controlled -Current treatment  Inspire device  Melatonin 10 mg liquid at bedtime  Hydroxyzine 50 mg 1 tablet at bedtime as needed -Medications previously tried: Seroquel, Zzquil  -Counseled on maximum of 10 mg per night of melatonin  Allergic rhinitis (Goal: minimize symptoms) -Controlled -Current treatment  azelastine (Astelin) 0.1% nasal spray,  2 sprays into both nostrils twice daily as directed -Medications previously tried: none  -Recommended to continue current medication  Arthritis pain (Goal: minimize pain) -Controlled -Current treatment  Osteo Bi-flex (glucosamine condroitin & vitamin D) 2000 units daily -Medications previously tried: none  -Counseled on giving the medicine a few weeks trial before deciding if it is helping; discussed stopping vitamin D supplementation with this as the replacement  GERD (Goal: minimize symptoms) -Controlled -Current treatment  Omeprazole 20 mg 1 capsule twice daily -Medications previously tried: none  -Recommended to continue current medication  Low potassium (Goal: 3.5-5) -Controlled -Current treatment  Potassium chloride 10 mEq 1 tablet daily -Medications previously tried: none  -Recommended repeat potassium level  Low back pain (Goal: minimize pain) -Not ideally controlled -Current treatment  Hydrocodone/ acetaminophen (Norco) 10/32534m1 tablet every six hours as needed for moderate pain (patient reports usage depends on activities of the day; some days she may take every six hours, other days she might not at all - no more than 2 per day)  -Medications previously tried: none  -Counseled on separating from other sedating medications  ADHD (Goal: remain focused) -Controlled -Current treatment  Adderall 10 mg 1 tablet daily -Medications previously tried: higher dose (too focused)  -Recommended to continue current medication   Health Maintenance -Vaccine gaps: second dose of shingrix, COVID vaccine -Current therapy:  Vitamin E 1 tablet once a week Magnesium 500 mg daily Vitamin B complex 1 capsule daily Multivitamin 1 tablet daily Acyclovir 400 mg 1 tablet four times daily - patient only takes once daily -Educated on Cost vs benefit of each product must be carefully weighed by individual consumer -Patient is satisfied with current therapy and denies  issues -Recommended to continue current medication  Patient Goals/Self-Care Activities Patient will:  - take medications as prescribed focus on medication adherence by filling out weekly pill box and marking calendar for doses taken target a minimum of 150 minutes of moderate  intensity exercise weekly  Follow Up Plan: The care management team will reach out to the patient again over the next 30 days.         Medication Assistance: None required.  Patient affirms current coverage meets needs.  Compliance/Adherence/Medication fill history: Care Gaps: COVID vaccine, shingrix, pap smear, mammogram, DEXA, influenza vaccine Last BP - 140/90 (05/01/21)   Star-Rating Drugs: Simvastatin (Zocor) 80 mg - Last filled 01/28/2021 90 DS at [place]  Patient's preferred pharmacy is:  [facility], [address] [place] [address] Phone: [phone] Fax: [phone]  Uses pill box? Yes Pt endorses 80% compliance - husband is filling the pillbox and they are using the calendar to mark the days she has taken her medications (filling pillbox on Mondays)  We discussed: Benefits of medication synchronization, packaging and delivery as well as enhanced pharmacist oversight with [employer]. Patient decided to: Continue current medication management strategy  Care Plan and Follow Up Patient Decision:  Patient agrees to Care Plan and Follow-up.  Plan: The care management team will reach out to the patient again over the next 30 days.  Jeni Salles, PharmD [facility] Clinical Pharmacist [place] at [place]

## 2021-05-15 ENCOUNTER — Telehealth: Payer: Self-pay | Admitting: Pharmacist

## 2021-05-15 ENCOUNTER — Ambulatory Visit (INDEPENDENT_AMBULATORY_CARE_PROVIDER_SITE_OTHER): Payer: PPO | Admitting: Pharmacist

## 2021-05-15 DIAGNOSIS — I10 Essential (primary) hypertension: Secondary | ICD-10-CM

## 2021-05-15 DIAGNOSIS — F419 Anxiety disorder, unspecified: Secondary | ICD-10-CM

## 2021-05-15 NOTE — Telephone Encounter (Signed)
  Chronic Care Management   Outreach Note  05/15/2021 Name: [name] [name] MRN: [id number] DOB: [birth year]  Referred by: Nelwyn Salisbury, MD  Patient had a phone appointment scheduled with clinical pharmacist today.  An unsuccessful telephone outreach was attempted today. The patient was referred to the pharmacist for assistance with care management and care coordination.   If possible, a message was left to return call to: [phone] or to [facility]: [phone]   Gaylord Shih, PharmD, [facility] Clinical Pharmacist [facility] at [place] [phone]

## 2021-05-16 NOTE — Patient Instructions (Addendum)
Hi [name] [name],   It was great to get to catch up with you again! I am so glad things are going much better for you.  Please reach out to me if you have any questions or need anything before our follow up!  Best, [name]  Gaylord Shih, PharmD, [facility] Clinical Pharmacist [facility] [phone]     Visit Information   Goals Addressed   None    Patient Care Plan: CCM Pharmacy Care Plan     Problem Identified: Problem: Hypertension, Hyperlipidemia, GERD, Depression, Anxiety, Allergic Rhinitis and ADHD and back pain      Long-Range Goal: Patient-Specific Goal   Start Date: 09/21/2020  Expected End Date: 09/21/2021  Recent Progress: On track  Priority: High  Note:   Current Barriers:  Unable to independently monitor therapeutic efficacy Unable to achieve control of cholesterol  Unable to self administer medications as prescribed   Pharmacist Clinical Goal(s):  Patient will achieve adherence to monitoring guidelines and medication adherence to achieve therapeutic efficacy achieve control of cholesterol as evidenced by next lipid panel  through collaboration with PharmD and provider.   Interventions: 1:1 collaboration with Nelwyn Salisbury, MD regarding development and update of comprehensive plan of care as evidenced by provider attestation and co-signature Inter-disciplinary care team collaboration (see longitudinal plan of care) Comprehensive medication review performed; medication list updated in electronic medical record  Hypertension (BP goal <140/90) -Controlled -Current treatment: No medications -Medications previously tried: n/a  -Current home readings: 136/78, 133/101, 132/78, 155/73, 132/79 (brand new arm cuff) -Current dietary habits: been eating more fruit and vegetables and not eating out at all -Current exercise habits: would like to start walking and recommended trying yoga on youtube to improve balance -Denies hypotensive/hypertensive  symptoms -Educated on Daily salt intake goal < 2300 mg; Exercise goal of 150 minutes per week; -Counseled to monitor BP at home as directed, document, and provide log at future appointments -Counseled on diet and exercise extensively  Hyperlipidemia: (LDL goal < 100) -Uncontrolled -Current treatment: simvastatin 80mg , 1 tablet once daily  -Medications previously tried: none  -Current dietary patterns: doing much better with diet; not eating out at all and eating lots of fruits. Seafood and stir fry vegetables -Current exercise habits: no consistent exercise -Educated on Cholesterol goals;  Benefits of statin for ASCVD risk reduction; Importance of limiting foods high in cholesterol; Exercise goal of 150 minutes per week; -Counseled on diet and exercise extensively Recommended to switch to high intensity statin therapy  Depression/Anxiety/bipolar disorder (Goal: minimize symptoms) -Not ideally controlled -Current treatment: fluoxetine 40 mg, 1 tablet once daily alprazolam 2mg , 1 tablet three times daily as needed (patient reports sometimes not taking once a day) -Medications previously tried/failed: Seroquel, Lithium -PHQ9: 0 -GAD7: n/a -Connected with Health phone number [phone]) for mental health support -Educated on Benefits of medication for symptom control Benefits of cognitive-behavioral therapy with or without medication -Recommended calling behavioral health to set up an appointment  Insomnia (Goal: improve quality and quantity of sleep) -Controlled -Current treatment  Inspire device  Melatonin 10 mg liquid at bedtime  Hydroxyzine 50 mg 1 tablet at bedtime as needed -Medications previously tried: Seroquel, Zzquil  -Counseled on maximum of 10 mg per night of melatonin  Allergic rhinitis (Goal: minimize symptoms) -Controlled -Current treatment  azelastine (Astelin) 0.1% nasal spray, 2 sprays into both nostrils twice daily as  directed -Medications previously tried: none  -Recommended to continue current medication  Arthritis pain (Goal: minimize pain) -Controlled -Current  treatment  Osteo Bi-flex (glucosamine condroitin & vitamin D) 2000 units daily -Medications previously tried: none  -Counseled on giving the medicine a few weeks trial before deciding if it is helping; discussed stopping vitamin D supplementation with this as the replacement  GERD (Goal: minimize symptoms) -Controlled -Current treatment  Omeprazole 20 mg 1 capsule twice daily -Medications previously tried: none  -Recommended to continue current medication  Low potassium (Goal: 3.5-5) -Controlled -Current treatment  Potassium chloride 10 mEq 1 tablet daily -Medications previously tried: none  -Recommended repeat potassium level  Low back pain (Goal: minimize pain) -Not ideally controlled -Current treatment  Hydrocodone/ acetaminophen (Norco) 10/325mg , 1 tablet every six hours as needed for moderate pain (patient reports usage depends on activities of the day; some days she may take every six hours, other days she might not at all - no more than 2 per day)  -Medications previously tried: none  -Counseled on separating from other sedating medications  ADHD (Goal: remain focused) -Controlled -Current treatment  Adderall 10 mg 1 tablet daily -Medications previously tried: higher dose (too focused)  -Recommended to continue current medication   Health Maintenance -Vaccine gaps: second dose of shingrix, COVID vaccine -Current therapy:  Vitamin E 1 tablet once a week Magnesium 500 mg daily Vitamin B complex 1 capsule daily Multivitamin 1 tablet daily Acyclovir 400 mg 1 tablet four times daily - patient only takes once daily -Educated on Cost vs benefit of each product must be carefully weighed by individual consumer -Patient is satisfied with current therapy and denies issues -Recommended to continue current  medication  Patient Goals/Self-Care Activities Patient will:  - take medications as prescribed focus on medication adherence by filling out weekly pill box and marking calendar for doses taken target a minimum of 150 minutes of moderate intensity exercise weekly  Follow Up Plan: The care management team will reach out to the patient again over the next 30 days.         Patient verbalizes understanding of instructions provided today and agrees to view in MyChart.  The pharmacy team will reach out to the patient again over the next 30 days.   Verner Chol, [facility]

## 2021-05-16 NOTE — Telephone Encounter (Signed)
We need to start with a CXR. I put in the order so she can come by to get this done here

## 2021-05-16 NOTE — Telephone Encounter (Signed)
Spoke with patient appointment for CXR scheduled for 05/17/21 at 10:40.

## 2021-05-17 ENCOUNTER — Other Ambulatory Visit: Payer: Self-pay

## 2021-05-17 ENCOUNTER — Telehealth: Payer: Self-pay | Admitting: Family Medicine

## 2021-05-17 ENCOUNTER — Telehealth: Payer: Self-pay | Admitting: Pharmacist

## 2021-05-17 ENCOUNTER — Ambulatory Visit (INDEPENDENT_AMBULATORY_CARE_PROVIDER_SITE_OTHER): Payer: PPO

## 2021-05-17 ENCOUNTER — Other Ambulatory Visit: Payer: PPO

## 2021-05-17 DIAGNOSIS — F419 Anxiety disorder, unspecified: Secondary | ICD-10-CM

## 2021-05-17 DIAGNOSIS — Z87891 Personal history of nicotine dependence: Secondary | ICD-10-CM

## 2021-05-17 DIAGNOSIS — E785 Hyperlipidemia, unspecified: Secondary | ICD-10-CM

## 2021-05-17 DIAGNOSIS — I10 Essential (primary) hypertension: Secondary | ICD-10-CM

## 2021-05-17 DIAGNOSIS — R0602 Shortness of breath: Secondary | ICD-10-CM

## 2021-05-17 MED ORDER — ROSUVASTATIN CALCIUM 40 MG PO TABS: 40.0000 mg | ORAL_TABLET | Freq: Every day | ORAL | 3 refills | Status: DC

## 2021-05-17 NOTE — Addendum Note (Signed)
Addended by: Gershon Crane A on: 05/17/2021 07:36 AM   Modules accepted: Orders

## 2021-05-17 NOTE — Telephone Encounter (Signed)
Called patient to follow up on CCM visit medication change. Patient is aware to stop taking the simvastatin and switch to the rosuvastatin daily.   Patient has already picked up the new medication from the pharmacy and denies any questions.

## 2021-05-17 NOTE — Telephone Encounter (Signed)
Patient states she went off of Oxycodone and right now she is taking Tylenol.  She states it is not helping and wants to know what Dr. Clent Ridges would like her to take.

## 2021-05-17 NOTE — Addendum Note (Signed)
Addended by: Gershon Crane A on: 05/17/2021 10:46 AM   Modules accepted: Orders

## 2021-05-18 ENCOUNTER — Encounter: Payer: Self-pay | Admitting: Family Medicine

## 2021-05-18 NOTE — Addendum Note (Signed)
Addended by: Gershon Crane A on: 05/18/2021 11:00 AM   Modules accepted: Orders

## 2021-05-18 NOTE — Telephone Encounter (Signed)
Please advise 

## 2021-05-19 DIAGNOSIS — Z86010 Personal history of colonic polyps: Secondary | ICD-10-CM | POA: Diagnosis not present

## 2021-05-19 DIAGNOSIS — Z1211 Encounter for screening for malignant neoplasm of colon: Secondary | ICD-10-CM | POA: Diagnosis not present

## 2021-05-19 MED ORDER — TRAMADOL HCL 50 MG PO TABS: 100.0000 mg | ORAL_TABLET | Freq: Three times a day (TID) | ORAL | 0 refills | Status: DC | PRN

## 2021-05-19 NOTE — Telephone Encounter (Signed)
Spoke with patient about message.  

## 2021-05-19 NOTE — Telephone Encounter (Signed)
She can try Tramadol. I will send in a supply for her

## 2021-05-24 ENCOUNTER — Ambulatory Visit: Payer: PPO | Admitting: Neurology

## 2021-05-25 ENCOUNTER — Inpatient Hospital Stay: Admission: RE | Admit: 2021-05-25 | Payer: PPO | Source: Ambulatory Visit

## 2021-06-01 ENCOUNTER — Ambulatory Visit (INDEPENDENT_AMBULATORY_CARE_PROVIDER_SITE_OTHER)
Admission: RE | Admit: 2021-06-01 | Discharge: 2021-06-01 | Disposition: A | Discharge status: Home / Self Care | Payer: PPO | Source: Ambulatory Visit | Attending: Family Medicine | Admitting: Family Medicine

## 2021-06-01 ENCOUNTER — Other Ambulatory Visit: Payer: Self-pay

## 2021-06-01 DIAGNOSIS — R911 Solitary pulmonary nodule: Secondary | ICD-10-CM | POA: Diagnosis not present

## 2021-06-01 DIAGNOSIS — J439 Emphysema, unspecified: Secondary | ICD-10-CM | POA: Diagnosis not present

## 2021-06-01 DIAGNOSIS — I700 Atherosclerosis of aorta: Secondary | ICD-10-CM | POA: Diagnosis not present

## 2021-06-01 DIAGNOSIS — R918 Other nonspecific abnormal finding of lung field: Secondary | ICD-10-CM | POA: Diagnosis not present

## 2021-06-01 MED ORDER — IOHEXOL 300 MG/ML  SOLN
80.0000 mL | Freq: Once | INTRAMUSCULAR | Status: AC | PRN
  Administered 2021-06-01: 80 mL via INTRAVENOUS

## 2021-06-02 ENCOUNTER — Other Ambulatory Visit: Payer: Self-pay | Admitting: Family Medicine

## 2021-06-05 NOTE — Addendum Note (Signed)
Addended by: Gershon Crane A on: 06/05/2021 12:27 PM   Modules accepted: Orders

## 2021-06-07 ENCOUNTER — Telehealth: Payer: Self-pay | Admitting: Family Medicine

## 2021-06-07 MED ORDER — AMPHETAMINE-DEXTROAMPHET ER 10 MG PO CP24: 10.0000 mg | ORAL_CAPSULE | Freq: Every day | ORAL | 0 refills | Status: DC

## 2021-06-07 NOTE — Telephone Encounter (Signed)
Done

## 2021-06-07 NOTE — Telephone Encounter (Signed)
Pt is calling and needs a refill on amphetamine-dextroamphetamine (ADDERALL XR) 10 MG 24 hr capsule  [address] - [place], [place] - [address] Phone:  [phone]  Fax:  [phone]

## 2021-06-07 NOTE — Telephone Encounter (Signed)
Last OV- 05/01/21 Last refill- 05/01/21-30 capsules, 0 refills  No future OV scheduled.    Can this patient receive a refill?

## 2021-06-16 ENCOUNTER — Telehealth: Payer: Self-pay | Admitting: Family Medicine

## 2021-06-16 NOTE — Telephone Encounter (Signed)
hello please call Zenaida Niece  at [facility]  [facility] [phone].. she is also on teams  she wants to know if pt had a bmet -[name] [name], [name] [name] "Kay"-MRN:  [id number] please give her a call to confirm trying to get the pt scheduled for a CT

## 2021-06-16 NOTE — Telephone Encounter (Signed)
Left a detailed message for [name] [name] with [facility] regarding pt labs for BMET.A teams message was also sent, advised to call the office on Tuesday with any questions

## 2021-06-20 ENCOUNTER — Telehealth: Payer: Self-pay

## 2021-06-20 ENCOUNTER — Telehealth: Payer: Self-pay | Admitting: Family Medicine

## 2021-06-20 NOTE — Telephone Encounter (Signed)
Last OV- 05/01/21 Last refill-05/19/21-  60 tabs, 0 refills  No future OV scheduled.   Can this patient receive a refill?

## 2021-06-20 NOTE — Telephone Encounter (Signed)
This encounter has already been taken resolved

## 2021-06-20 NOTE — Addendum Note (Signed)
Addended by: Gershon Crane A on: 06/20/2021 12:19 PM   Modules accepted: Orders

## 2021-06-20 NOTE — Telephone Encounter (Signed)
Message sent to PCP for advise ?

## 2021-06-20 NOTE — Telephone Encounter (Signed)
I ordered the BMET

## 2021-06-20 NOTE — Telephone Encounter (Signed)
Patient called because she needs refill on traMADol (ULTRAM) 50 MG tablet Patient ran out yesterday.    Please send to  [facility] - [place], [place] - [address] Phone:  [phone]  Fax:  [phone]         Please advise

## 2021-06-20 NOTE — Telephone Encounter (Signed)
Spoke with pt advised that she needs lab appointment to check her BMET per [facility]. Pt stated that she was sick with cold like symptoms and that she may not be able to come to our office, pt was advised to call [facility] and see if she can walk  in for the lab. Pt verbalized understanding state that she will call them tomorrow

## 2021-06-21 MED ORDER — TRAMADOL HCL 50 MG PO TABS: 100.0000 mg | ORAL_TABLET | Freq: Three times a day (TID) | ORAL | 2 refills | Status: DC | PRN

## 2021-06-21 NOTE — Telephone Encounter (Signed)
Done

## 2021-06-21 NOTE — Telephone Encounter (Signed)
FYI Called patient to inform that prescription request for Tramadol  has been sent to [facility].     Patient requested that this prescription be canceled, she has decided to take Acetaminophen instead.

## 2021-06-21 NOTE — Telephone Encounter (Signed)
Then please call to cancel it

## 2021-06-21 NOTE — Telephone Encounter (Signed)
Called [facility] to cancel Tramadol  prescription

## 2021-06-22 ENCOUNTER — Other Ambulatory Visit (INDEPENDENT_AMBULATORY_CARE_PROVIDER_SITE_OTHER): Payer: PPO

## 2021-06-22 DIAGNOSIS — I10 Essential (primary) hypertension: Secondary | ICD-10-CM | POA: Diagnosis not present

## 2021-06-22 LAB — BASIC METABOLIC PANEL
BUN: 13 mg/dL (ref 6–23)
CO2: 31 mEq/L (ref 19–32)
Calcium: 9.8 mg/dL (ref 8.4–10.5)
Chloride: 101 mEq/L (ref 96–112)
Creatinine, Ser: 0.7 mg/dL (ref 0.40–1.20)
GFR: 90.38 mL/min (ref >60.00)
Glucose, Bld: 102 mg/dL — ABNORMAL HIGH (ref 70–99)
Potassium: 4.3 mEq/L (ref 3.5–5.1)
Sodium: 140 mEq/L (ref 135–145)

## 2021-06-26 ENCOUNTER — Ambulatory Visit (INDEPENDENT_AMBULATORY_CARE_PROVIDER_SITE_OTHER)
Admission: RE | Admit: 2021-06-26 | Discharge: 2021-06-26 | Disposition: A | Discharge status: Home / Self Care | Payer: PPO | Source: Ambulatory Visit | Attending: Family Medicine | Admitting: Family Medicine

## 2021-06-26 ENCOUNTER — Other Ambulatory Visit: Payer: Self-pay

## 2021-06-26 DIAGNOSIS — R935 Abnormal findings on diagnostic imaging of other abdominal regions, including retroperitoneum: Secondary | ICD-10-CM

## 2021-06-26 DIAGNOSIS — M4697 Unspecified inflammatory spondylopathy, lumbosacral region: Secondary | ICD-10-CM | POA: Diagnosis not present

## 2021-06-26 DIAGNOSIS — N2889 Other specified disorders of kidney and ureter: Secondary | ICD-10-CM | POA: Diagnosis not present

## 2021-06-26 DIAGNOSIS — I700 Atherosclerosis of aorta: Secondary | ICD-10-CM | POA: Diagnosis not present

## 2021-06-26 DIAGNOSIS — M160 Bilateral primary osteoarthritis of hip: Secondary | ICD-10-CM | POA: Diagnosis not present

## 2021-06-26 MED ORDER — IOHEXOL 300 MG/ML  SOLN
100.0000 mL | Freq: Once | INTRAMUSCULAR | Status: AC | PRN
  Administered 2021-06-26: 100 mL via INTRAVENOUS

## 2021-06-28 ENCOUNTER — Telehealth: Payer: Self-pay | Admitting: Pharmacist

## 2021-06-28 NOTE — Progress Notes (Signed)
Chronic Care Management Pharmacy Assistant   Name: [name] [name]  MRN: [id number] DOB: [birth year]  Reason for Encounter: Disease State / Hypertension Assessment   Conditions to be addressed/monitored: HTN  Recent office visits:  None  Recent consult visits:  05/19/21 [name] [name] (Gastroenterology) - Patient was seen for Colonoscopy. No medication changes  05/17/21 [name] [name] (Radiology) Patient presented for Diagnostic Radiology. No other visit details available.  Hospital visits:  None in previous 6 months  Medications: Outpatient Encounter Medications as of 06/28/2021  Medication Sig   acyclovir (ZOVIRAX) 400 MG tablet TAKE 1 TABLET BY MOUTH 4 TIMES A DAY AS NEEDED   alprazolam (XANAX) 2 MG tablet TAKE 1 TABLET BY MOUTH 3 TIMES DAILY AS NEEDED FOR ANXIETY.   [START ON 08/08/2021] amphetamine-dextroamphetamine (ADDERALL XR) 10 MG 24 hr capsule Take 1 capsule (10 mg total) by mouth daily.   Ascorbic Acid (VITAMIN C) 1000 MG tablet Take 1,000 mg by mouth daily.   azelastine (ASTELIN) 0.1 % nasal spray PLACE 2 SPRAYS INTO BOTH NOSTRILS 2 TIMES DAILY AS DIRECTED   Cholecalciferol (VITAMIN D3) 125 MCG (5000 UT) CAPS Take 5,000 Units by mouth once a week.   FLUoxetine (PROZAC) 40 MG capsule Take 1 capsule (40 mg total) by mouth daily.   Glucosamine-Chondroitin (OSTEO BI-FLEX REGULAR STRENGTH PO) Take 1 tablet by mouth daily.   Magnesium 500 MG CAPS Take 500 mg by mouth daily.   melatonin 3 MG TABS tablet Take 3 mg by mouth at bedtime.   Multiple Vitamin (MULTIVITAMIN WITH MINERALS) TABS tablet Take 2 tablets by mouth daily.   omeprazole (PRILOSEC) 20 MG capsule Take 1 capsule (20 mg total) by mouth at bedtime.   polyethylene glycol (MIRALAX / GLYCOLAX) 17 g packet Take 17 g by mouth daily as needed.   potassium chloride (KLOR-CON) 10 MEQ tablet TAKE 1 TABLET BY MOUTH DAILY.   Probiotic Product (PROBIOTIC PO) Take 1 capsule by mouth daily.    rosuvastatin (CRESTOR) 40 MG  tablet Take 1 tablet (40 mg total) by mouth daily.   traMADol (ULTRAM) 50 MG tablet Take 2 tablets (100 mg total) by mouth every 8 (eight) hours as needed.   VITAMIN E PO Take 1 tablet by mouth once a week.   No facility-administered encounter medications on file as of 06/28/2021.  Reviewed chart prior to disease state call. Spoke with patient regarding BP  Recent Office Vitals: BP Readings from Last 3 Encounters:  05/01/21 140/90  11/11/20 116/75  10/17/20 126/78   Pulse Readings from Last 3 Encounters:  05/01/21 71  11/11/20 79  10/17/20 77    Wt Readings from Last 3 Encounters:  05/01/21 171 lb 4 oz (77.7 kg)  11/11/20 159 lb 9.6 oz (72.4 kg)  10/17/20 163 lb (73.9 kg)     Kidney Function Lab Results  Component Value Date/Time   CREATININE 0.70 06/22/2021 10:47 AM   CREATININE 0.75 05/01/2021 10:15 AM   CREATININE 0.67 03/17/2020 11:52 AM   CREATININE 0.68 11/08/2017 04:34 PM   GFR 90.38 06/22/2021 10:47 AM   GFRNONAA >60 07/28/2020 07:53 PM   GFRAA >60 03/23/2019 01:55 PM    BMP Latest Ref Rng & Units 06/22/2021 05/01/2021 10/17/2020  Glucose 70 - 99 mg/dL 384(Y) 81 81  BUN 6 - 23 mg/dL 13 65(L) 10  Creatinine 0.40 - 1.20 mg/dL 9.35 7.01 7.79  BUN/Creat Ratio 6 - 22 (calc) - - -  Sodium 135 - 145 mEq/L 140 140 136  Potassium  3.5 - 5.1 mEq/L 4.3 5.5 No hemolysis seen(H) 4.1  Chloride 96 - 112 mEq/L 101 101 99  CO2 19 - 32 mEq/L 31 32 30  Calcium 8.4 - 10.5 mg/dL 9.8 72.0 9.6    Current antihypertensive regimen:  None  How often are you checking your Blood Pressure? weekly Current home BP readings: Patient reports she has been consisently within the 112/80 range. She reports it has not gotten much lower, she denies any dizziness or lightheadedness. What recent interventions/DTPs have been made by any provider to improve Blood Pressure control since last CPP Visit: Patient reports no changes Any recent hospitalizations or ED visits since last visit with CPP?  No Patient reports she is recovering from a sinus infection and finally feeling better, she also is happy to report that her kidney scans she recently had done came back good. She reports no other issues or concerns at this time.  Adherence Review: Is the patient currently on ACE/ARB medication? No Does the patient have >5 day gap between last estimated fill dates? No    Care Gaps: COVID Vaccine - Overdue Zoster Vaccine - Overdue Mammogram - Overdue DEXA - Overdue Flu Vaccine - Overdue AWV- 2/22 CCM- 3/23 BP- 112/80 (home 06/28/21)  Star Rating Drugs: Simvastatin (Zocor) 80 mg - Last filled 05/17/2021 90 DS at [facility]    Pamala Duffel CMA Clinical Pharmacist Assistant [phone]

## 2021-07-12 ENCOUNTER — Telehealth: Payer: PPO | Admitting: Cardiology

## 2021-07-26 ENCOUNTER — Ambulatory Visit: Payer: PPO

## 2021-07-26 ENCOUNTER — Telehealth: Payer: Self-pay | Admitting: Cardiology

## 2021-07-26 ENCOUNTER — Ambulatory Visit (INDEPENDENT_AMBULATORY_CARE_PROVIDER_SITE_OTHER): Payer: PPO

## 2021-07-26 VITALS — Ht 62.0 in | Wt 171.0 lb

## 2021-07-26 DIAGNOSIS — Z0000 Encounter for general adult medical examination without abnormal findings: Secondary | ICD-10-CM

## 2021-07-26 NOTE — Progress Notes (Signed)
Subjective:   [name] [name] is a 66 y.o. female who presents for Medicare Annual (Subsequent) preventive examination.  Review of Systems    Virtual Visit via Telephone Note  I connected with  [name] [name] on 07/26/21 at  3:15 PM EST by telephone and verified that I am speaking with the correct person using two identifiers.  Location: Patient: Home Provider: Office Persons participating in the virtual visit: patient/Nurse Health Advisor   I discussed the limitations, risks, security and privacy concerns of performing an evaluation and management service by telephone and the availability of in person appointments. The patient expressed understanding and agreed to proceed.  Interactive audio and video telecommunications were attempted between this nurse and patient, however failed, due to patient having technical difficulties OR patient did not have access to video capability.  We continued and completed visit with audio only.  Some vital signs may be absent or patient reported.   Tillie Rung, LPN  Cardiac Risk Factors include: advanced age (>46men, >56 women);hypertension     Objective:    Today's Vitals   07/26/21 1516 07/26/21 1518  Weight: 171 lb (77.6 kg)   Height: 5\' 2"  (1.575 m)   PainSc:  6    Body mass index is 31.28 kg/m.  Advanced Directives 07/26/2021 07/28/2020 07/20/2020 11/06/2019 08/25/2019 07/14/2019 03/23/2019  Does Patient Have a Medical Advance Directive? Yes Yes Yes No Yes No Yes  Type of 05/23/2019 of [place];Living will Healthcare Power of [place];Living will - Healthcare Power of [place];Living will - Living will  Does patient want to make changes to medical advance directive? No - Patient declined No - Patient declined - - No - Patient declined - No - Patient declined  Copy of Healthcare Power of Attorney in Chart? No - copy requested No - copy requested No - copy requested - No - copy  requested - -  Would patient like information on creating a medical advance directive? - - - - - Yes (MAU/Ambulatory/Procedural Areas - Information given) -    Current Medications (verified) Outpatient Encounter Medications as of 07/26/2021  Medication Sig   acyclovir (ZOVIRAX) 400 MG tablet TAKE 1 TABLET BY MOUTH 4 TIMES A DAY AS NEEDED   alprazolam (XANAX) 2 MG tablet TAKE 1 TABLET BY MOUTH 3 TIMES DAILY AS NEEDED FOR ANXIETY.   [START ON 08/08/2021] amphetamine-dextroamphetamine (ADDERALL XR) 10 MG 24 hr capsule Take 1 capsule (10 mg total) by mouth daily.   Ascorbic Acid (VITAMIN C) 1000 MG tablet Take 1,000 mg by mouth daily.   azelastine (ASTELIN) 0.1 % nasal spray PLACE 2 SPRAYS INTO BOTH NOSTRILS 2 TIMES DAILY AS DIRECTED   Cholecalciferol (VITAMIN D3) 125 MCG (5000 UT) CAPS Take 5,000 Units by mouth once a week.   FLUoxetine (PROZAC) 40 MG capsule Take 1 capsule (40 mg total) by mouth daily.   Glucosamine-Chondroitin (OSTEO BI-FLEX REGULAR STRENGTH PO) Take 1 tablet by mouth daily.   Magnesium 500 MG CAPS Take 500 mg by mouth daily.   melatonin 3 MG TABS tablet Take 3 mg by mouth at bedtime.   Multiple Vitamin (MULTIVITAMIN WITH MINERALS) TABS tablet Take 2 tablets by mouth daily.   omeprazole (PRILOSEC) 20 MG capsule Take 1 capsule (20 mg total) by mouth at bedtime.   polyethylene glycol (MIRALAX / GLYCOLAX) 17 g packet Take 17 g by mouth daily as needed.   potassium chloride (KLOR-CON) 10 MEQ tablet TAKE 1 TABLET BY MOUTH  DAILY.   Probiotic Product (PROBIOTIC PO) Take 1 capsule by mouth daily.    rosuvastatin (CRESTOR) 40 MG tablet Take 1 tablet (40 mg total) by mouth daily.   traMADol (ULTRAM) 50 MG tablet Take 2 tablets (100 mg total) by mouth every 8 (eight) hours as needed.   VITAMIN E PO Take 1 tablet by mouth once a week.   No facility-administered encounter medications on file as of 07/26/2021.    Allergies (verified) Sulfonamide derivatives, Flexeril [cyclobenzaprine],  Hydromorphone, Morphine and related, and Sulfamethoxazole   History: Past Medical History:  Diagnosis Date   Allergic rhinitis 01/23/2008   Angina pectoris 06/06/2016   Arthritis    Attention deficit hyperactivity disorder (ADHD) 02/07/2016   Back pain, lumbar 01/21/2009   Bell's palsy 02/06/2008   Bipolar disorder 12/13/2009   Chronic idiopathic constipation    Dizziness 04/16/2008   Edema, leg 01/21/2009   Epigastric pain    Essential hypertension 01/23/2008   Gastro-esophageal reflux disease without esophagitis 01/23/2008   Generalized anxiety disorder 01/23/2008   Headache 04/15/2007   Hidradenitis 10/04/2008   History of colonic polyps    Hyperlipidemia    Hypokalemia 07/23/2018   Insomnia 01/23/2008   Major depressive disorder 01/23/2008   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   Neck pain 05/06/2017   Obesity (BMI 30-39.9) 11/10/2015   OSA (obstructive sleep apnea) 04/15/2007   inspire implant; does not always remember to turn device on   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/2008   Qualifier: Diagnosis of  By: Clent Ridges MD, Jeannett Senior A    PUD (peptic ulcer disease) 06/06/2016   Unspecified urinary incontinence 01/23/2008   Wears dentures    Wears glasses    Past Surgical History:  Procedure Laterality Date   ABDOMINAL HYSTERECTOMY     ANTERIOR FUSION CERVICAL SPINE  2006   Dr. Wynetta Emery   BREAST BIOPSY Right 1981   benign   BREAST EXCISIONAL BIOPSY Right 1981   benign   scar does not show   BREAST SURGERY Bilateral 12-11-12   reductions per Dr. Ivin Booty in [facility]    CATARACT EXTRACTION, BILATERAL Bilateral last summer of 2018   at [facility]   COLONOSCOPY  04/25/2016   per Dr. Loreta Ave, adenomatous polyps, repeat in 5 yrs    DRUG INDUCED ENDOSCOPY N/A 01/30/2019   Procedure: DRUG INDUCED ENDOSCOPY;  Surgeon: Christia Reading, MD;  Location: [facility];  Service: ENT;  Laterality: N/A;   ESOPHAGOGASTRODUODENOSCOPY  04-23-11   per Dr. Loreta Ave, normal    EYE SURGERY  06/18/2016   L  eyelid    IMPLANTATION OF HYPOGLOSSAL NERVE STIMULATOR Right 03/25/2019   Procedure: IMPLANTATION OF HYPOGLOSSAL NERVE STIMULATOR;  Surgeon: Christia Reading, MD;  Location[facility];  Service: ENT;  Laterality: Right;   LUMBAR DISC SURGERY  2003   Dr. Wynetta Emery   MULTIPLE TOOTH EXTRACTIONS     ORIF WRIST FRACTURE Right 08/29/2018   REDUCTION MAMMAPLASTY Bilateral 2013   SPLIT NIGHT STUDY  11/11/2015   UVULOPALATOPHARYNGOPLASTY     VAGINAL HYSTERECTOMY  12/96   secondary to fibroids.   Family History  Problem Relation Age of Onset   Dementia Mother    Hyperlipidemia Mother    Asthma Father    Hypertension Sister    Hyperlipidemia Sister    Coronary artery disease Sister    Thyroid disease Sister    Hypertension Brother    Hyperlipidemia Brother    Coronary artery disease Brother    Hypertension Sister    Hyperlipidemia Sister  Alcohol abuse Other        fhx   Arthritis Other        fhx   Coronary artery disease Other        fhx   Depression Other        fhx   Diabetes Other        fhx   Hyperlipidemia Other        fhx   Hypertension Other        fhx   Sudden death Other        fhx   Social History   Socioeconomic History   Marital status: Married    Spouse name: Not on file   Number of children: 2   Years of education: 14   Highest education level: Associate degree: academic program  Occupational History   Occupation: Disabled  Tobacco Use   Smoking status: Former    Years: 30.00    Types: Cigarettes    Quit date: 12/2020    Years since quitting: 0.6   Smokeless tobacco: Never  Vaping Use   Vaping Use: Never used  Substance and Sexual Activity   Alcohol use: Not Currently   Drug use: No   Sexual activity: Not Currently    Partners: Male    Birth control/protection: Surgical  Other Topics Concern   Not on file  Social History Narrative   [place], [place]   Right handed    Occasional use of caffeine   Lives with ex-husband         07/10/2018:  Lives with ex-husband on one-level house. Divorced, according to husband, for financial benefit only    Has two children, one local and one in [place]. Has several grandchildren locally who can help her occasionally if needed.   Social Determinants of Health   Financial Resource Strain: Low Risk    Difficulty of Paying Living Expenses: Not hard at all  Food Insecurity: No Food Insecurity   Worried About Programme researcher, broadcasting/film/videounning Out of Food in the Last Year: Never true   Ran Out of Food in the Last Year: Never true  Transportation Needs: No Transportation Needs   Lack of Transportation (Medical): No   Lack of Transportation (Non-Medical): No  Physical Activity: Insufficiently Active   Days of Exercise per Week: 3 days   Minutes of Exercise per Session: 10 min  Stress: Stress Concern Present   Feeling of Stress : Very much  Social Connections: Socially Integrated   Frequency of Communication with Friends and Family: More than three times a week   Frequency of Social Gatherings with Friends and Family: More than three times a week   Attends Religious Services: 1 to 4 times per year   Active Member of [employer] or Organizations: Yes   Attends Banker[employer] or Organization Meetings: 1 to 4 times per year   Marital Status: Married    Tobacco Counseling Counseling given: Not Answered   Clinical Intake:  Pre-visit preparation completed: Yes  Pain : 0-10 Pain Score: 6  Pain Type: Chronic pain Pain Location: Breast Pain Orientation: Right, Left Pain Descriptors / Indicators: Aching Pain Onset: More than a month ago Pain Frequency: Constant Pain Relieving Factors: Rx Medication Effect of Pain on Daily Activities: None  Pain Relieving Factors: Rx Medication  BMI - recorded: 31.32 Nutritional Status: BMI > 30  Obese Nutritional Risks: None Diabetes: No  How often do you need to have someone help you when you read instructions, pamphlets, or other  written materials from your doctor or pharmacy?: 1 -  Never  Diabetic? No  Interpreter Needed?: NoActivities of Daily Living In your present state of health, do you have any difficulty performing the following activities: 07/26/2021  Hearing? N  Vision? N  Difficulty concentrating or making decisions? Y  Comment Followed by PCP  Walking or climbing stairs? N  Dressing or bathing? N  Doing errands, shopping? N  Comment Husband Ship broker and eating ? N  Using the Toilet? N  In the past six months, have you accidently leaked urine? Y  Comment Patinet incontient wears breif. Followed by [facility]  Do you have problems with loss of bowel control? N  Managing your Medications? N  Managing your Finances? N  Comment Husband Assist  Housekeeping or managing your Housekeeping? N  Comment Husband assist  Some recent data might be hidden    Patient Care Team: Nelwyn Salisbury, MD as PCP - General [name], MD as Consulting Physician (Cardiology) Verner Chol, [facility] as Pharmacist (Pharmacist)  Indicate any recent Medical Services you may have received from other than [facility] providers in the past year (date may be approximate).     Assessment:   This is a routine wellness examination for [name] [name].  Hearing/Vision screen Hearing Screening - Comments:: No difficulty hearing  Vision Screening - Comments:: Wears glasses. Followed by [facility]  Dietary issues and exercise activities discussed: Current Exercise Habits: Home exercise routine, Type of exercise: walking, Time (Minutes): 10, Frequency (Times/Week): 3, Weekly Exercise (Minutes/Week): 30, Intensity: Mild   Goals Addressed             This Visit's Progress    Patient Stated       Keep active and walk more.       Depression Screen PHQ 2/9 Scores 07/26/2021 07/20/2020 06/08/2020 07/14/2019 07/25/2018 07/10/2018 07/09/2017  PHQ - 2 Score 6 0 6 1 1 2 2   PHQ- 9 Score 18 - 20 - - 19 7    Fall Risk Fall Risk  07/26/2021 07/20/2020 06/08/2020 07/14/2019  08/11/2018  Falls in the past year? 1 1 1  0 1  Number falls in past yr: 0 1 1 - 1  Injury with Fall? 0 0 0 - 1  Comment Fall w/o injury or medical attention needed - - - -  Risk for fall due to : Impaired balance/gait;Mental status change Impaired mobility;Impaired balance/gait;Impaired vision;History of fall(s) - Medication side effect -  Follow up Falls prevention discussed;Education provided Education provided Falls evaluation completed Falls evaluation completed;Education provided;Falls prevention discussed Falls evaluation completed  Comment - - - - -    FALL RISK PREVENTION PERTAINING TO THE HOME:  Any stairs in or around the home? Yes  If so, are there any without handrails? No  Home free of loose throw rugs in walkways, pet beds, electrical cords, etc? Yes  Adequate lighting in your home to reduce risk of falls? Yes   ASSISTIVE DEVICES UTILIZED TO PREVENT FALLS:  Life alert? No  Use of a cane, walker or w/c? Yes  Grab bars in the bathroom? No  Shower chair or bench in shower? Yes  Elevated toilet seat or a handicapped toilet? No   TIMED UP AND GO:  Was the test performed? No . Audio Visit  Cognitive Function: MMSE - Mini Mental State Exam 03/30/2016  Orientation to time 5  Orientation to Place 4  Registration 3  Attention/ Calculation 4  Recall 2  Language- name 2 objects 2  Language- repeat 1  Language- follow 3 step command 3  Language- read & follow direction 1  Write a sentence 1  Copy design 1  Total score 27     6CIT Screen 07/26/2021 07/20/2020 07/14/2019  What Year? 0 points 0 points 0 points  What month? 0 points 0 points 0 points  What time? 0 points - 0 points  Count back from 20 0 points 0 points 0 points  Months in reverse 0 points 2 points 0 points  Repeat phrase 0 points 4 points 0 points  Total Score 0 - 0    Immunizations Immunization History  Administered Date(s) Administered   Influenza Split 04/26/2011   Influenza Whole 03/23/2010    Influenza,inj,Quad PF,6+ Mos 03/20/2013, 03/02/2015, 03/26/2016, 03/25/2017, 05/08/2019, 03/17/2020   Influenza-Unspecified 05/26/2014, 03/03/2018   Pneumococcal Conjugate-13 04/19/2016   Pneumococcal Polysaccharide-23 03/07/2018   Tdap 04/20/2013   Zoster Recombinat (Shingrix) 02/18/2019   Zoster, Live 03/26/2016    TDAP status: Up to date  Flu Vaccine status: Declined, Education has been provided regarding the importance of this vaccine but patient still declined. Advised may receive this vaccine at local pharmacy or Health Dept. Aware to provide a copy of the vaccination record if obtained from local pharmacy or Health Dept. Verbalized acceptance and understanding.  Pneumococcal vaccine status: Up to date  Covid-19 vaccine status: Declined, Education has been provided regarding the importance of this vaccine but patient still declined. Advised may receive this vaccine at local pharmacy or Health Dept.or vaccine clinic. Aware to provide a copy of the vaccination record if obtained from local pharmacy or Health Dept. Verbalized acceptance and understanding.  Qualifies for Shingles Vaccine? Yes   Zostavax completed No   Shingrix Completed?: No.    Education has been provided regarding the importance of this vaccine. Patient has been advised to call insurance company to determine out of pocket expense if they have not yet received this vaccine. Advised may also receive vaccine at local pharmacy or Health Dept. Verbalized acceptance and understanding.  Screening Tests Health Maintenance  Topic Date Due   COVID-19 Vaccine (1) 08/11/2021 (Originally 12/14/1956)   INFLUENZA VACCINE  09/15/2021 (Originally 01/16/2021)   Zoster Vaccines- Shingrix (2 of 2) 10/23/2021 (Originally 04/15/2019)   MAMMOGRAM  07/26/2022 (Originally 03/26/2020)   DEXA SCAN  07/26/2022 (Originally 06/14/2020)   Pneumonia Vaccine 41+ Years old (3 - PPSV23 if available, else PCV20) 03/08/2023   TETANUS/TDAP  04/21/2023    COLONOSCOPY (Pts 45-73yrs Insurance coverage will need to be confirmed)  05/20/2031   Hepatitis C Screening  Completed   HPV VACCINES  Aged Out    Health Maintenance  There are no preventive care reminders to display for this patient.   Colorectal cancer screening: Type of screening: Colonoscopy. Completed 05/19/21. Repeat every 10 years  Mammogram status: Ordered  . Pt provided with contact info and advised to call to schedule appt.   Bone Density status: Completed 01/04/09. Results reflect: Bone density results: NORMAL. Repeat every 1 years.    Additional Screening:  Hepatitis C Screening: does qualify; Completed 08/08/15  Vision Screening: Recommended annual ophthalmology exams for early detection of glaucoma and other disorders of the eye. Is the patient up to date with their annual eye exam?  Yes  Who is the provider or what is the name of the office in which the patient attends annual eye exams? [facility] If pt is not established with a provider, would they like  to be referred to a provider to establish care? No .   Dental Screening: Recommended annual dental exams for proper oral hygiene  Community Resource Referral / Chronic Care Management:  CRR required this visit?  No   CCM required this visit?  No      Plan:     I have personally reviewed and noted the following in the patients chart:   Medical and social history Use of alcohol, tobacco or illicit drugs  Current medications and supplements including opioid prescriptions.  Functional ability and status Nutritional status Physical activity Advanced directives List of other physicians Hospitalizations, surgeries, and ER visits in previous 12 months Vitals Screenings to include cognitive, depression, and falls Referrals and appointments  In addition, I have reviewed and discussed with patient certain preventive protocols, quality metrics, and best practice recommendations. A written personalized  care plan for preventive services as well as general preventive health recommendations were provided to patient.     Tillie RungBeverly W Hyland Mollenkopf, LPN   4/0/98112/01/2022   Nurse Notes: Patient request f/u visit with continued concerns of elevated b/p and also discuss personal concerns of  referred counseling Patient denies being suicidal. Appt scheduled fo 07/28/21

## 2021-07-26 NOTE — Telephone Encounter (Signed)
Pt c/o of Chest Pain: STAT if CP now or developed within 24 hours  1. Are you having CP right now? yes  2. Are you experiencing any other symptoms (ex. SOB, nausea, vomiting, sweating)? SOB, but not now  3. How long have you been experiencing CP? A little over a month  4. Is your CP continuous or coming and going? continuous  5. Have you taken Nitroglycerin? no   Patient states she has chest pain and it also hurts across her back. She states her PCP sent her for a lung scan. She says she also she chose to get off her opiates. She states she did start an exercise program as well. She says she thinks the problem is her lungs and states she thinks she needs an in office appointment for the chest pain.   ?

## 2021-07-26 NOTE — Telephone Encounter (Signed)
Call sent straight to triage for patient having chest pain. Patient stated that she is having back pain, not chest pain, and she knows it is her lungs. Patient stated she thought Dr. Mayford Knife was a pulmonologist that could help her with her lungs. Informed patient that she needs to call her PCP and discuss this with him. Patient stated this started after coming off her pain medications. Informed patient if she starts having chest pain to go to ED. Patient verbalized understanding and will call her PCP.

## 2021-07-26 NOTE — Patient Instructions (Addendum)
[name] [name] , Thank you for taking time to come for your Medicare Wellness Visit. I appreciate your ongoing commitment to your health goals. Please review the following plan we discussed and let me know if I can assist you in the future.   These are the goals we discussed:  Goals      Exercise 150 min/wk Moderate Activity     Increase physical activity      Patient Stated     Keep active and walk more.     Quit Smoking     Find effective way to deal with negative energy such as exercise and staying busy with a project        This is a list of the screening recommended for you and due dates:  Health Maintenance  Topic Date Due   COVID-19 Vaccine (1) 08/11/2021*   Flu Shot  09/15/2021*   Zoster (Shingles) Vaccine (2 of 2) 10/23/2021*   Mammogram  07/26/2022*   DEXA scan (bone density measurement)  07/26/2022*   Pneumonia Vaccine (3 - PPSV23 if available, else PCV20) 03/08/2023   Tetanus Vaccine  04/21/2023   Colon Cancer Screening  05/20/2031   Hepatitis C Screening: USPSTF Recommendation to screen - Ages 18-79 yo.  Completed   HPV Vaccine  Aged Out  *Topic was postponed. The date shown is not the original due date.    Opioid Pain Medicine Management Opioids are powerful medicines that are used to treat moderate to severe pain. When used for short periods of time, they can help you to: Sleep better. Do better in physical or occupational therapy. Feel better in the first few days after an injury. Recover from surgery. Opioids should be taken with the supervision of a trained health care provider. They should be taken for the shortest period of time possible. This is because opioids can be addictive, and the longer you take opioids, the greater your risk of addiction. This addiction can also be called opioid use disorder. What are the risks? Using opioid pain medicines for longer than 3 days increases your risk of side effects. Side effects include: Constipation. Nausea and  vomiting. Breathing difficulties (respiratory depression). Drowsiness. Confusion. Opioid use disorder. Itching. Taking opioid pain medicine for a long period of time can affect your ability to do daily tasks. It also puts you at risk for: Motor vehicle crashes. Depression. Suicide. Heart attack. Overdose, which can be life-threatening. What is a pain treatment plan? A pain treatment plan is an agreement between you and your health care provider. Pain is unique to each person, and treatments vary depending on your condition. To manage your pain, you and your health care provider need to work together. To help you do this: Discuss the goals of your treatment, including how much pain you might expect to have and how you will manage the pain. Review the risks and benefits of taking opioid medicines. Remember that a good treatment plan uses more than one approach and minimizes the chance of side effects. Be honest about the amount of medicines you take and about any drug or alcohol use. Get pain medicine prescriptions from only one health care provider. Pain can be managed with many types of alternative treatments. Ask your health care provider to refer you to one or more specialists who can help you manage pain through: Physical or occupational therapy. Counseling (cognitive behavioral therapy). Good nutrition. Biofeedback. Massage. Meditation. Non-opioid medicine. Following a gentle exercise program. How to use opioid pain medicine Taking medicine  Take your pain medicine exactly as told by your health care provider. Take it only when you need it. If your pain gets less severe, you may take less than your prescribed dose if your health care provider approves. If you are not having pain, do nottake pain medicine unless your health care provider tells you to take it. If your pain is severe, do nottry to treat it yourself by taking more pills than instructed on your prescription. Contact  your health care provider for help. Write down the times when you take your pain medicine. It is easy to become confused while on pain medicine. Writing the time can help you avoid overdose. Take other over-the-counter or prescription medicines only as told by your health care provider. Keeping yourself and others safe  While you are taking opioid pain medicine: Do not drive, use machinery, or power tools. Do not sign legal documents. Do not drink alcohol. Do not take sleeping pills. Do not supervise children by yourself. Do not do activities that require climbing or being in high places. Do not go to a lake, river, ocean, spa, or swimming pool. Do not share your pain medicine with anyone. Keep pain medicine in a locked cabinet or in a secure area where pets and children cannot reach it. Stopping your use of opioids If you have been taking opioid medicine for more than a few weeks, you may need to slowly decrease (taper) how much you take until you stop completely. Tapering your use of opioids can decrease your risk of symptoms of withdrawal, such as: Pain and cramping in the abdomen. Nausea. Sweating. Sleepiness. Restlessness. Uncontrollable shaking (tremors). Cravings for the medicine. Do not attempt to taper your use of opioids on your own. Talk with your health care provider about how to do this. Your health care provider may prescribe a step-down schedule based on how much medicine you are taking and how long you have been taking it. Getting rid of leftover pills Do not save any leftover pills. Get rid of leftover pills safely by: Taking the medicine to a prescription take-back program. This is usually offered by the county or law enforcement. Bringing them to a pharmacy that has a drug disposal container. Flushing them down the toilet. Check the label or package insert of your medicine to see whether this is safe to do. Throwing them out in the trash. Check the label or package  insert of your medicine to see whether this is safe to do. If it is safe to throw it out, remove the medicine from the original container, put it into a sealable bag or container, and mix it with used coffee grounds, food scraps, dirt, or cat litter before putting it in the trash. Follow these instructions at home: Activity Do exercises as told by your health care provider. Avoid activities that make your pain worse. Return to your normal activities as told by your health care provider. Ask your health care provider what activities are safe for you. General instructions You may need to take these actions to prevent or treat constipation: Drink enough fluid to keep your urine pale yellow. Take over-the-counter or prescription medicines. Eat foods that are high in fiber, such as beans, whole grains, and fresh fruits and vegetables. Limit foods that are high in fat and processed sugars, such as fried or sweet foods. Keep all follow-up visits. This is important. Where to find support If you have been taking opioids for a long time, you may benefit from  receiving support for quitting from a local support group or counselor. Ask your health care provider for a referral to these resources in your area. Where to find more information Centers for Disease Control and Prevention (CDC): FootballExhibition.com.br [place] Food and Drug Administration (FDA): PumpkinSearch.com.ee Get help right away if: You may have taken too much of an opioid (overdosed). Common symptoms of an overdose: Your breathing is slower or more shallow than normal. You have a very slow heartbeat (pulse). You have slurred speech. You have nausea and vomiting. Your pupils become very small. You have other potential symptoms: You are very confused. You faint or feel like you will faint. You have cold, clammy skin. You have blue lips or fingernails. You have thoughts of harming yourself or harming others. These symptoms may represent a serious problem  that is an emergency. Do not wait to see if the symptoms will go away. Get medical help right away. Call your local emergency services (911 in the [place] Do not drive yourself to the hospital.  If you ever feel like you may hurt yourself or others, or have thoughts about taking your own life, get help right away. Go to your nearest emergency department or: Call your local emergency services (911 in the [place] Call the [facility] (2033369720 in the [place] Call a suicide crisis helpline, such as the National Suicide Prevention Lifeline at 857-454-7008 or 988 in the [place] This is open 24 hours a day in the [place] Text the Crisis Text Line at 204-146-1420 (in the [place] Summary Opioid medicines can help you manage moderate to severe pain for a short period of time. A pain treatment plan is an agreement between you and your health care provider. Discuss the goals of your treatment, including how much pain you might expect to have and how you will manage the pain. If you think that you or someone else may have taken too much of an opioid, get medical help right away. This information is not intended to replace advice given to you by your health care provider. Make sure you discuss any questions you have with your health care provider. Document Revised: 12/28/2020 Document Reviewed: 09/14/2020 Elsevier Patient Education  2022 [employer]  Advanced directives: Yes Patient will bring a copy  Conditions/risks identified:   Next appointment: Follow up in one year for your annual wellness visit    Preventive Care 65 Years and Older, Female Preventive care refers to lifestyle choices and visits with your health care provider that can promote health and wellness. What does preventive care include? A yearly physical exam. This is also called an annual well check. Dental exams once or twice a year. Routine eye exams. Ask your health care provider how often you should have your eyes  checked. Personal lifestyle choices, including: Daily care of your teeth and gums. Regular physical activity. Eating a healthy diet. Avoiding tobacco and drug use. Limiting alcohol use. Practicing safe sex. Taking low-dose aspirin every day. Taking vitamin and mineral supplements as recommended by your health care provider. What happens during an annual well check? The services and screenings done by your health care provider during your annual well check will depend on your age, overall health, lifestyle risk factors, and family history of disease. Counseling  Your health care provider may ask you questions about your: Alcohol use. Tobacco use. Drug use. Emotional well-being. Home and relationship well-being. Sexual activity. Eating habits. History of falls. Memory and ability to understand (cognition). Work and work  environment. Reproductive health. Screening  You may have the following tests or measurements: Height, weight, and BMI. Blood pressure. Lipid and cholesterol levels. These may be checked every 5 years, or more frequently if you are over 67 years old. Skin check. Lung cancer screening. You may have this screening every year starting at age 67 if you have a 30-pack-year history of smoking and currently smoke or have quit within the past 15 years. Fecal occult blood test (FOBT) of the stool. You may have this test every year starting at age 67. Flexible sigmoidoscopy or colonoscopy. You may have a sigmoidoscopy every 5 years or a colonoscopy every 10 years starting at age 67. Hepatitis C blood test. Hepatitis B blood test. Sexually transmitted disease (STD) testing. Diabetes screening. This is done by checking your blood sugar (glucose) after you have not eaten for a while (fasting). You may have this done every 1-3 years. Bone density scan. This is done to screen for osteoporosis. You may have this done starting at age 67. Mammogram. This may be done every 1-2  years. Talk to your health care provider about how often you should have regular mammograms. Talk with your health care provider about your test results, treatment options, and if necessary, the need for more tests. Vaccines  Your health care provider may recommend certain vaccines, such as: Influenza vaccine. This is recommended every year. Tetanus, diphtheria, and acellular pertussis (Tdap, Td) vaccine. You may need a Td booster every 10 years. Zoster vaccine. You may need this after age 67. Pneumococcal 13-valent conjugate (PCV13) vaccine. One dose is recommended after age 67. Pneumococcal polysaccharide (PPSV23) vaccine. One dose is recommended after age 67. Talk to your health care provider about which screenings and vaccines you need and how often you need them. This information is not intended to replace advice given to you by your health care provider. Make sure you discuss any questions you have with your health care provider. Document Released: 07/01/2015 Document Revised: 02/22/2016 Document Reviewed: 04/05/2015 Elsevier Interactive Patient Education  2017 [employer]  Fall Prevention in the Home Falls can cause injuries. They can happen to people of all ages. There are many things you can do to make your home safe and to help prevent falls. What can I do on the outside of my home? Regularly fix the edges of walkways and driveways and fix any cracks. Remove anything that might make you trip as you walk through a door, such as a raised step or threshold. Trim any bushes or trees on the path to your home. Use bright outdoor lighting. Clear any walking paths of anything that might make someone trip, such as rocks or tools. Regularly check to see if handrails are loose or broken. Make sure that both sides of any steps have handrails. Any raised decks and porches should have guardrails on the edges. Have any leaves, snow, or ice cleared regularly. Use sand or salt on walking paths  during winter. Clean up any spills in your garage right away. This includes oil or grease spills. What can I do in the bathroom? Use night lights. Install grab bars by the toilet and in the tub and shower. Do not use towel bars as grab bars. Use non-skid mats or decals in the tub or shower. If you need to sit down in the shower, use a plastic, non-slip stool. Keep the floor dry. Clean up any water that spills on the floor as soon as it happens. Remove soap buildup in  the tub or shower regularly. Attach bath mats securely with double-sided non-slip rug tape. Do not have throw rugs and other things on the floor that can make you trip. What can I do in the bedroom? Use night lights. Make sure that you have a light by your bed that is easy to reach. Do not use any sheets or blankets that are too big for your bed. They should not hang down onto the floor. Have a firm chair that has side arms. You can use this for support while you get dressed. Do not have throw rugs and other things on the floor that can make you trip. What can I do in the kitchen? Clean up any spills right away. Avoid walking on wet floors. Keep items that you use a lot in easy-to-reach places. If you need to reach something above you, use a strong step stool that has a grab bar. Keep electrical cords out of the way. Do not use floor polish or wax that makes floors slippery. If you must use wax, use non-skid floor wax. Do not have throw rugs and other things on the floor that can make you trip. What can I do with my stairs? Do not leave any items on the stairs. Make sure that there are handrails on both sides of the stairs and use them. Fix handrails that are broken or loose. Make sure that handrails are as long as the stairways. Check any carpeting to make sure that it is firmly attached to the stairs. Fix any carpet that is loose or worn. Avoid having throw rugs at the top or bottom of the stairs. If you do have throw  rugs, attach them to the floor with carpet tape. Make sure that you have a light switch at the top of the stairs and the bottom of the stairs. If you do not have them, ask someone to add them for you. What else can I do to help prevent falls? Wear shoes that: Do not have high heels. Have rubber bottoms. Are comfortable and fit you well. Are closed at the toe. Do not wear sandals. If you use a stepladder: Make sure that it is fully opened. Do not climb a closed stepladder. Make sure that both sides of the stepladder are locked into place. Ask someone to hold it for you, if possible. Clearly mark and make sure that you can see: Any grab bars or handrails. First and last steps. Where the edge of each step is. Use tools that help you move around (mobility aids) if they are needed. These include: Canes. Walkers. Scooters. Crutches. Turn on the lights when you go into a dark area. Replace any light bulbs as soon as they burn out. Set up your furniture so you have a clear path. Avoid moving your furniture around. If any of your floors are uneven, fix them. If there are any pets around you, be aware of where they are. Review your medicines with your doctor. Some medicines can make you feel dizzy. This can increase your chance of falling. Ask your doctor what other things that you can do to help prevent falls. This information is not intended to replace advice given to you by your health care provider. Make sure you discuss any questions you have with your health care provider. Document Released: 03/31/2009 Document Revised: 11/10/2015 Document Reviewed: 07/09/2014 Elsevier Interactive Patient Education  2017 [employer].

## 2021-07-28 ENCOUNTER — Ambulatory Visit: Payer: PPO | Admitting: Family Medicine

## 2021-07-30 NOTE — Progress Notes (Signed)
Virtual Visit via Video Note   This visit type was conducted due to national recommendations for restrictions regarding the COVID-19 Pandemic (e.g. social distancing) in an effort to limit this patient's exposure and mitigate transmission in our community.  Due to her co-morbid illnesses, this patient is at least at moderate risk for complications without adequate follow up.  This format is felt to be most appropriate for this patient at this time.  All issues noted in this document were discussed and addressed.  A limited physical exam was performed with this format.  Please refer to the patient's chart for her consent to telehealth for [facility].  Date:  07/31/2021   ID:  [name] [name], DOB 1955/07/14, MRN [id number] The patient was identified using 2 identifiers.  Patient Location: Home Provider Location: Home Office   PCP:  Nelwyn Salisbury, MD   [facility] Providers Cardiologist:  None     Evaluation Performed:  Follow-Up Visit  Chief Complaint:  OSA  History of Present Illness:    [name] [name] is a 66 y.o. female with a history of ADHD, bipolar disorder, hypertension, GERD, hyperlipidemia, obesity.  She has a hx of OSA on CPAP therapy.  Unfortunately she was intolerant to CPAP and requested ENT evaluation of  Inspire. Device.  She underwent hypoglossal nerve stimulator implant by Dr. Jenne Pane on 03/25/2019.  She was seen back on 05/01/2019 for device activation.  She had an in lab study done 04/30/2020 showed which showed no significant obstructive sleep apnea using her inspire device with persistent nocturnal hypoxemia with O2 saturations as low as 86%.  Overnight pulse oximetry was ordered but this does not appear to have ever been done.  I have not seen her since 10/06/2019.  She tells me that she has doing wonderful with her Inspire device.  She is on level 5.  She has had problems with not sleeping through the night recently but has some other medical problems  going on and has also lost several family members in the past few years which she thinks is contributing.   She feels rested when she gets up and has no daytime sleepiness although once in a while she will nap.  She says that occasionally she will have to pause herself if she does not get to sleep fast enough.  When she gets up in the am she has no tongue sensitivity.  Her husband thinks he has heard her snoring some but very light.   The patient does not have symptoms concerning for COVID-19 infection (fever, chills, cough, or new shortness of breath).    Past Medical History:  Diagnosis Date   Allergic rhinitis 01/23/2008   Angina pectoris 06/06/2016   Arthritis    Attention deficit hyperactivity disorder (ADHD) 02/07/2016   Back pain, lumbar 01/21/2009   Bell's palsy 02/06/2008   Bipolar disorder 12/13/2009   Chronic idiopathic constipation    Dizziness 04/16/2008   Edema, leg 01/21/2009   Epigastric pain    Essential hypertension 01/23/2008   Gastro-esophageal reflux disease without esophagitis 01/23/2008   Generalized anxiety disorder 01/23/2008   Headache 04/15/2007   Hidradenitis 10/04/2008   History of colonic polyps    Hyperlipidemia    Hypokalemia 07/23/2018   Insomnia 01/23/2008   Major depressive disorder 01/23/2008   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   Neck pain 05/06/2017   Obesity (BMI 30-39.9) 11/10/2015   OSA (obstructive sleep apnea) 04/15/2007   inspire implant; does not always remember to turn device on  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 Wynetta Emery   BREAST BIOPSY Right 1981   benign   BREAST EXCISIONAL BIOPSY Right 1981   benign   scar does not show   BREAST SURGERY Bilateral  12-11-12   reductions per Dr. Ivin Booty in [facility]    CATARACT EXTRACTION, BILATERAL Bilateral last summer of 2018   at [facility]   COLONOSCOPY  04/25/2016   per Dr. Loreta Ave, adenomatous polyps, repeat in 5 yrs    DRUG INDUCED ENDOSCOPY N/A 01/30/2019   Procedure: DRUG INDUCED ENDOSCOPY;  Surgeon: Christia Reading, MD;  Location: [facility];  Service: ENT;  Laterality: N/A;   ESOPHAGOGASTRODUODENOSCOPY  04-23-11   per Dr. Loreta Ave, normal    EYE SURGERY  06/18/2016   L eyelid    IMPLANTATION OF HYPOGLOSSAL NERVE STIMULATOR Right 03/25/2019   Procedure: IMPLANTATION OF HYPOGLOSSAL NERVE STIMULATOR;  Surgeon: Christia Reading, MD;  Location: [facility] OR;  Service: ENT;  Laterality: Right;   LUMBAR DISC SURGERY  2003   Dr. Wynetta Emery   MULTIPLE TOOTH EXTRACTIONS     ORIF WRIST FRACTURE Right 08/29/2018   REDUCTION MAMMAPLASTY Bilateral 2013   SPLIT NIGHT STUDY  11/11/2015   UVULOPALATOPHARYNGOPLASTY     VAGINAL HYSTERECTOMY  12/96   secondary to fibroids.     Current Meds  Medication Sig   acyclovir (ZOVIRAX) 400 MG tablet TAKE 1 TABLET BY MOUTH 4 TIMES A DAY AS NEEDED   alprazolam (XANAX) 2 MG tablet TAKE 1 TABLET BY MOUTH 3 TIMES DAILY AS NEEDED FOR ANXIETY.   [START ON 08/08/2021] amphetamine-dextroamphetamine (ADDERALL XR) 10 MG 24 hr capsule Take 1 capsule (10 mg total) by mouth daily.   Ascorbic Acid (VITAMIN C) 1000 MG tablet Take 1,000 mg by mouth daily.   azelastine (ASTELIN) 0.1 % nasal spray PLACE 2 SPRAYS INTO BOTH NOSTRILS 2 TIMES DAILY AS DIRECTED   Cholecalciferol (VITAMIN D3) 125 MCG (5000 UT) CAPS Take 5,000 Units by mouth once a week.   FLUoxetine (PROZAC) 40 MG capsule Take 1 capsule (40 mg total) by mouth daily.   Glucosamine-Chondroitin (OSTEO BI-FLEX REGULAR STRENGTH PO) Take 1 tablet by mouth daily.   Magnesium 500 MG CAPS Take 500 mg by mouth daily.   melatonin 3 MG TABS tablet Take 3 mg by mouth at bedtime.   Multiple Vitamin (MULTIVITAMIN WITH MINERALS) TABS  tablet Take 2 tablets by mouth daily.   omeprazole (PRILOSEC) 20 MG capsule Take 1 capsule (20 mg total) by mouth at bedtime.   polyethylene glycol (MIRALAX / GLYCOLAX) 17 g packet Take 17 g by mouth daily as needed.   potassium chloride (KLOR-CON) 10 MEQ tablet TAKE 1 TABLET BY MOUTH DAILY.   Probiotic Product (PROBIOTIC PO) Take 1 capsule by mouth daily.    rosuvastatin (CRESTOR) 40 MG tablet Take 1 tablet (40 mg total) by mouth daily.   traMADol (ULTRAM) 50 MG tablet Take 2 tablets (100 mg total) by mouth every 8 (eight) hours as needed.   VITAMIN E PO Take 1 tablet by mouth once a week.     Allergies:   Sulfonamide derivatives, Flexeril [cyclobenzaprine], Hydromorphone, Morphine and related, and Sulfamethoxazole  Social History   Tobacco Use   Smoking status: Former    Years: 30.00    Types: Cigarettes    Quit date: 12/2020    Years since quitting: 0.6   Smokeless tobacco: Never  Vaping Use   Vaping Use: Never used  Substance Use Topics   Alcohol use: Not Currently   Drug use: No     Family Hx: The patient's family history includes Alcohol abuse in an other family member; Arthritis in an other family member; Asthma in her father; Coronary artery disease in her brother, sister, and another family member; Dementia in her mother; Depression in an other family member; Diabetes in an other family member; Hyperlipidemia in her brother, mother, sister, sister, and another family member; Hypertension in her brother, sister, sister, and another family member; Sudden death in an other family member; Thyroid disease in her sister.  ROS:   Please see the history of present illness.     All other systems reviewed and are negative.   Prior CV studies:   The following studies were reviewed today:  Home sleep study  Labs/Other Tests and Data Reviewed:    EKG:  No ECG reviewed.  Recent Labs: 05/01/2021: ALT 21; Hemoglobin 12.9; Platelets 178.0; TSH 2.46 06/22/2021: BUN 13;  Creatinine, Ser 0.70; Potassium 4.3; Sodium 140   Recent Lipid Panel Lab Results  Component Value Date/Time   CHOL 240 (H) 05/01/2021 10:15 AM   TRIG 131.0 05/01/2021 10:15 AM   HDL 65.10 05/01/2021 10:15 AM   CHOLHDL 4 05/01/2021 10:15 AM   LDLCALC 149 (H) 05/01/2021 10:15 AM   LDLCALC 124 (H) 03/17/2020 11:52 AM   LDLDIRECT 146.0 03/10/2018 10:35 AM    Wt Readings from Last 3 Encounters:  07/31/21 167 lb 12.8 oz (76.1 kg)  07/26/21 171 lb (77.6 kg)  05/01/21 171 lb 4 oz (77.7 kg)     Risk Assessment/Calculations:          Objective:    Vital Signs:  Ht 5\' 2"  (1.575 m)    Wt 167 lb 12.8 oz (76.1 kg)    BMI 30.69 kg/m    VITAL SIGNS:  reviewed GEN:  no acute distress EYES:  sclerae anicteric, EOMI - Extraocular Movements Intact RESPIRATORY:  normal respiratory effort, symmetric expansion CARDIOVASCULAR:  no peripheral edema SKIN:  no rash, lesions or ulcers. MUSCULOSKELETAL:  no obvious deformities. NEURO:  alert and oriented x 3, no obvious focal deficit PSYCH:  normal affect  ASSESSMENT & PLAN:    OSA -intolerant to CPAP therapy -s/p hypoglossal nerve stimulator -At last office visit a hypoglossal nerve stimulation test was performed today and results as follows:             Multiple stimulation levels tested including 1.2V therapy (functional 1V) with electrode -/-/+, 2.5V (functional 1.7V) with electrode +/-/+ and 1.5V (functional 1V) with electrode -/+             Followup stimulation settings:                         Amplitude increased from 1.7V to 2.5V                         Patient control increased from 1.3-2.3V to 2.3-3.3V                         Pulse Width 90mcsec  Rate 33Hz                          Start delay 30 min                         Pause time 30 min                         Therapy Duratoin 12 hours                         Electrodes +/-/+ -she underwent repeat home sleep study on 04/29/2020 which demonstrated no  significant obstructive sleep apnea with an AHI of 2.0/h using her current inspire settings.  She had evidence of persistent nocturnal hypoxemia and overnight pulse syndrome was ordered but this was never done. -I will get an Itamar sleep study to see if she has any significant apneas or hypoxemia  2.  HTN -Her BP has been elevated recently and she is going to see her PCP -has not required any antihypertensive therapy   3.  Obesity -I have encouraged her to get into a routine exercise program and cut back on carbs and portions.      COVID-19 Education: The signs and symptoms of COVID-19 were discussed with the patient and how to seek care for testing (follow up with PCP or arrange E-visit).  The importance of social distancing was discussed today.  Time:   Today, I have spent 15 minutes with the patient with telehealth technology discussing the above problems.     Medication Adjustments/Labs and Tests Ordered: Current medicines are reviewed at length with the patient today.  Concerns regarding medicines are outlined above.   Tests Ordered: No orders of the defined types were placed in this encounter.   Medication Changes: No orders of the defined types were placed in this encounter.   Follow Up:  In Person in 1 year(s)  Signed, Armanda Magic, MD  07/31/2021 10:02 AM    [facility]

## 2021-07-31 ENCOUNTER — Telehealth (INDEPENDENT_AMBULATORY_CARE_PROVIDER_SITE_OTHER): Payer: PPO | Admitting: Cardiology

## 2021-07-31 ENCOUNTER — Other Ambulatory Visit: Payer: Self-pay

## 2021-07-31 ENCOUNTER — Encounter: Payer: Self-pay | Admitting: Cardiology

## 2021-07-31 ENCOUNTER — Telehealth: Payer: Self-pay

## 2021-07-31 VITALS — Ht 62.0 in | Wt 167.8 lb

## 2021-07-31 DIAGNOSIS — I10 Essential (primary) hypertension: Secondary | ICD-10-CM | POA: Diagnosis not present

## 2021-07-31 DIAGNOSIS — G4733 Obstructive sleep apnea (adult) (pediatric): Secondary | ICD-10-CM

## 2021-07-31 NOTE — Telephone Encounter (Signed)
° ° °  STOP BANG RISK ASSESSMENT S (snore) Have you been told that you snore?     YES   T (tired) Are you often tired, fatigued, or sleepy during the day?   YES  O (obstruction) Do you stop breathing, choke, or gasp during sleep? YES   P (pressure) Do you have or are you being treated for high blood pressure? NO   B (BMI) Is your body index greater than 35 kg/m? NO   A (age) Are you 67 years old or older? YES   N (neck) Do you have a neck circumference greater than 16 inches?   NO   G (gender) Are you a female? NO   TOTAL STOP/BANG YES ANSWERS 4                                                                       For Office Use Only              Procedure Order Form    YES to 3+ Stop Bang questions OR two clinical symptoms - patient qualifies for WatchPAT (CPT 95800)             Clinical Notes: Will consult Sleep Specialist and refer for management of therapy due to patient increased risk of Sleep Apnea. Ordering a sleep study due to the following two clinical symptoms: Excessive daytime sleepiness G47.10 / Gastroesophageal reflux K21.9 / Nocturia R35.1 / Morning Headaches G44.221 / Difficulty concentrating R41.840 / Memory problems or poor judgment G31.84 / Personality changes or irritability R45.4 / Loud snoring R06.83 / Depression F32.9 / Unrefreshed by sleep G47.8 / Impotence N52.9 / History of high blood pressure R03.0 / Insomnia G47.00    I understand that I am proceeding with a home sleep apnea test as ordered by my treating physician. I understand that untreated sleep apnea is a serious cardiovascular risk factor and it is my responsibility to perform the test and seek management for sleep apnea. I will be contacted with the results and be managed for sleep apnea by a local sleep physician. I will be receiving equipment and further instructions from [facility]. I shall promptly ship back the equipment via the included mailing label. I understand my insurance will be billed for  the test and as the patient I am responsible for any insurance related out-of-pocket costs incurred. I have been provided with written instructions and can call for additional video or telephonic instruction, with 24-hour availability of qualified personnel to answer any questions: Patient Help Desk [phone].  Patient Telemedicine Verbal Consent

## 2021-07-31 NOTE — Addendum Note (Signed)
Addended by: Antonieta Iba on: 07/31/2021 10:16 AM   Modules accepted: Orders

## 2021-07-31 NOTE — Patient Instructions (Signed)
Medication Instructions:  °Your physician recommends that you continue on your current medications as directed. Please refer to the Current Medication list given to you today. ° °*If you need a refill on your cardiac medications before your next appointment, please call your pharmacy* ° °Testing/Procedures: °Your physician has recommended that you have a sleep study. This test records several body functions during sleep, including: brain activity, eye movement, oxygen and carbon dioxide blood levels, heart rate and rhythm, breathing rate and rhythm, the flow of air through your mouth and nose, snoring, body muscle movements, and chest and belly movement. ° °Follow-Up: °At [facility], you and your health needs are our priority.  As part of our continuing mission to provide you with exceptional heart care, we have created designated Provider Care Teams.  These Care Teams include your primary Cardiologist (physician) and Advanced Practice Providers (APPs -  Physician Assistants and Nurse Practitioners) who all work together to provide you with the care you need, when you need it. ° °Your next appointment:   °1 year(s) ° °The format for your next appointment:   °In Person ° °Provider:   °Traci Turner, MD ° ° °

## 2021-07-31 NOTE — Telephone Encounter (Signed)
I s/w the pt and have set her up to come to set up Itamar Sleep Study. Pt has set up to come in 08/09/21 @ 10 am. Pt aware to let the front desk know she is her to see Okey Regal for Home Sleep Study. Pt thanked me for the call and the help.

## 2021-08-03 NOTE — Telephone Encounter (Signed)
NO PA REQUIRED, ready to schedule °

## 2021-08-04 ENCOUNTER — Telehealth: Payer: PPO

## 2021-08-04 NOTE — Telephone Encounter (Signed)
S/w the pt today and she has been given the PIN # [id number]. Pt is coming in 08/09/21 for me to set her up with the Itamar study. Pt aware once we have her set up on 08/09/21 she will be able to proceed with the sleep study. Pt thanked me for the help and will see me 08/09/21.

## 2021-08-07 NOTE — Telephone Encounter (Signed)
Pt walked in today to see if I could set her up with her sleep study today. I met with the pt and helped set Itamar application on her phone. Pt's husband was here today as well. Bout the pt and her husband gave verbal understanding to how the device works. I reviewed the tutorial with the pt and her husband. Pt is all approved and will do study on night this week. Pt is aware if study not done in 30 days, she may be billed $100.   Called and made the patient aware that she may proceed with the [facility] Sleep Study. PIN # provided to the patient. Patient made aware that she will be contacted after the test has been read with the results and any recommendations. Patient verbalized understanding and thanked me for the call.

## 2021-08-08 ENCOUNTER — Encounter (INDEPENDENT_AMBULATORY_CARE_PROVIDER_SITE_OTHER): Payer: PPO | Admitting: Cardiology

## 2021-08-08 DIAGNOSIS — G4733 Obstructive sleep apnea (adult) (pediatric): Secondary | ICD-10-CM | POA: Diagnosis not present

## 2021-08-09 ENCOUNTER — Telehealth: Payer: Self-pay | Admitting: *Deleted

## 2021-08-09 NOTE — Procedures (Signed)
° °  Sleep Study Report  Patient Information Study Date: 08/08/21 Patient Name: [name] [name] Patient ID: [id number] Birth Date: [birth year] Age: 66 Gender: Female BMI: 30.8 (W=167 lb, H=5' 2'') Neck Circ.: 16 '' Referring Physician: Armanda Magic, MD  TEST DESCRIPTION: Home sleep apnea testing was completed using the WatchPat, a Type 1 device, utilizing peripheral arterial tonometry (PAT), chest movement, actigraphy, pulse oximetry, pulse rate, body position and snore. AHI was calculated with apnea and hypopnea using valid sleep time as the denominator. RDI includes apneas, hypopneas, and RERAs. The data acquired and the scoring of sleep and all associated events were performed in accordance with the recommended standards and specifications as outlined in the AASM Manual for the Scoring of Sleep and Associated Events 2.2.0 (2015).  FINDINGS: 1. Moderate Obstructive Sleep Apnea with AHI 17.9/hr. 2. No Central Sleep Apnea with pAHIc 0/hr. 3. Oxygen desaturations as low as 81%. 4. Minimal snoring was present. O2 sats were < 88% for min. 5. Total sleep time was 5 hrs and 58 min. 6. 19.9 % of total sleep time was spent in REM sleep. 7. Prolonged sleep onset latency at 29 min 8. Shortened REM sleep onset latency at 47 min. 9. Total awakenings were 2.  DIAGNOSIS: Moderate Obstructive Sleep Apnea (G47.33)  RECOMMENDATIONS: 1. Clinical correlation of these findings is necessary. The decision to treat obstructive sleep apnea (OSA) is usually based on the presence of apnea symptoms or the presence of associated medical conditions such as Hypertension, Congestive Heart Failure, Atrial Fibrillation or Obesity. The most common symptoms of OSA are snoring, gasping for breath while sleeping, daytime sleepiness and fatigue.  2. Initiating apnea therapy is recommended given the presence of symptoms and/or associated conditions. Recommend proceeding with one of the following:   a. Auto-CPAP  therapy with a pressure range of 5-20cm H2O.   b. An oral appliance (OA) that can be obtained from certain dentists with expertise in sleep medicine. These are primarily of use in non-obese patients with mild and moderate disease.   c. An ENT consultation which may be useful to look for specific causes of obstruction and possible treatment options.   d. If patient is intolerant to PAP therapy, consider referral to ENT for evaluation for hypoglossal nerve stimulator.  3. Close follow-up is necessary to ensure success with CPAP or oral appliance therapy for maximum benefit .  4. A follow-up oximetry study on CPAP is recommended to assess the adequacy of therapy and determine the need for supplemental oxygen or the potential need for Bi-level therapy. An arterial blood gas to determine the adequacy of baseline ventilation and oxygenation should also be considered.  5. Healthy sleep recommendations include: adequate nightly sleep (normal 7-9 hrs/night), avoidance of caffeine after noon and alcohol near bedtime, and maintaining a sleep environment that is cool, dark and quiet.  6. Weight loss for overweight patients is recommended. Even modest amounts of weight loss can significantly improve the severity of sleep apnea.  7. Snoring recommendations include: weight loss where appropriate, side sleeping, and avoidance of alcohol before bed.  8. Operation of motor vehicle should not be performed when sleepy.  Signature: Electronically Signed: 08/09/21 Armanda Magic, MD; [facility]; Diplomat, American Board of Sleep Medicine

## 2021-08-09 NOTE — Telephone Encounter (Signed)
-----   Message from Lauralee Evener, [place] sent at 08/09/2021  3:59 PM EST -----  ----- Message ----- From: Sueanne Margarita, MD Sent: 08/09/2021   1:25 PM EST To: [facility] Sleep Studies  Please let patient know that they have sleep apnea.  Recommend therapeutic CPAP titration for treatment of patient's sleep disordered breathing.  If unable to perform an in lab titration then initiate ResMed auto CPAP from 4 to 15cm H2O with heated humidity and mask of choice and overnight pulse ox on CPAP.

## 2021-08-09 NOTE — Telephone Encounter (Signed)
The patient has been notified of the result and verbalized understanding.  All questions (if any) were answered. Latrelle Dodrill, CMA 08/09/2021 5:32 PM

## 2021-08-11 NOTE — Telephone Encounter (Signed)
Appointment made for March 21 at 1:30. Patient is agreeable to treatment.

## 2021-08-14 ENCOUNTER — Ambulatory Visit: Payer: PPO

## 2021-08-14 DIAGNOSIS — G4733 Obstructive sleep apnea (adult) (pediatric): Secondary | ICD-10-CM

## 2021-08-15 NOTE — Progress Notes (Signed)
Virtual Visit via Video Note The purpose of this virtual visit is to provide medical care while limiting exposure to the novel coronavirus.    Consent was obtained for video visit:  Yes.   Answered questions that patient had about telehealth interaction:  Yes.   I discussed the limitations, risks, security and privacy concerns of performing an evaluation and management service by telemedicine. I also discussed with the patient that there may be a patient responsible charge related to this service. The patient expressed understanding and agreed to proceed.  Pt location: Home Physician Location: office Name of referring provider:  Nelwyn Salisbury, MD I connected with [name] at patients initiation/request on 08/16/2021 at  9:30 AM EST by video enabled telemedicine application and verified that I am speaking with the correct person using two identifiers. Pt MRN:  [id number] Pt DOB:  [birth year] Video Participants:  [name]  Assessment and Plan:   1.  Chronic dizziness - multifactorial which I feel is related to polypharmacy and anxiety.  This is a chronic problem which has extensively been worked up.  Unfortunately, I do not have a solution. 2.  Cognitive deficits secondary to psychiatric distress, sleep dysfunction and chronic pain.  No evidence of underlying neurodegenerative disease such as Alzheimer's disease. 3.  Recurrent transient facial droop.  Unclear etiology.  Not consistent with Bell's palsy.  She has had brain MRIs which have been unremarkable.   Going forward, patient should follow recommendations as laid out by Dr. Milbert Coulter.  Unfortunately, I do not have anything else to offer in regards to management of her dizziness except for supportive therapy (home vestibular exercises, yoga/meditation, alprazolam if needed).  She will follow up with me as needed.  History of Present Illness:  [name] [name] is a 66 year old right-handed woman with Bipolar disorder,  PTSD, depression/anxiety, severe OSA (uses CPAP) and history of left Bell's palsy follows up for memory deficits.    UPDATE:  She underwent neuropsychological evaluation on 02/09/2021 which indicated that her cognitive deficits were related to underlying psychiatric distress, sleep dysfunction and chronic pain and not due to an underlying neurodegenerative disorder.  There was also question of ADHD as well.  Anxiety is better controlled and notes some improvement in memory  Dizziness persists but stable and manageable.  She has started yoga and meditation which helps.  Tries to stay active when able.  She is in good spirits.       HISTORY: Problems with balance problems, dizziness and memory since approximately 2008 or 2009 following a nervous breakdown.     She reports that she had a mental breakdown in 2009.  Since then, she has suffered from multiple symptomatology.  The dizziness and balance problem is not a spinning sensation.  She bumps into walls.  Her head feels like it is bobbling.  She says she veers to the right.  It occurs when walking or when kneeling.  She has bilateral aural fullness and tinnitus.  She endorses hearing loss in the left ear. She has been worked up by ENT and neurology in the past.  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  However, she had a repeat audiogram on 07/23/13, which was normal.  As per ENT note from 07/28/13, VNG showed suspected mixed central and peripheral findings, as he mentions spontaneous nystagmus indicative of a vestibular lesion, consistent with central vestibular lesion, left-sided caloric weakness is  often peripheral in vestibular finding.  Due to her balance problems, she has had multiple falls and concussions.  She has had several brain MRIs from 2008, 04/17/2010, and 04/19/2013 all showed normal major intracranial vascular flow and mild nonspecific white matter  changes.     She also reports memory problems since 2009, specifically short-term memory, which she says has gotten worse.  She quickly forgets conversations and has to repeat questions often.  She has trouble remembering to take medication.  She denies disorientation when driving.  She pays her own bills and reports once forgetting to pay a bill, which is not usual.  TSH and B12 levels have been normal.  She had neuropsychological testing for memory deficits on 03/25/18 which demonstrated no evidence of cognitive impairment or dementia.  Results overall were within normal range and mild areas of weakness in verbal fluency, auditory memory, and some aspects of executive functioning believed to be due to secondary factors such as medication effects of benzodiazepines and opiates and underlying mood disorder and emotional distress.   She has history of ACDF at C5-6.  MRI of the cervical spine was performed on 11/05/06, which showed post-surgical fusion at C5-6 and mild foraminal narrowing at C3-4 but no canal stenosis.  Repeat MRI of cervical spine from 04/19/13 again demonstrated no spinal stenosis.  She also has history of back and hip pain with prior lumbar laminotomy.  Most recent MRI of lumbar spine from 07/13/10 showed disc protrusion at L3-4 with facet arthritis, L4-5 disc bulge without stenosis and L5-S1 left laminotomy and facet arthrosis.   In 2009, she also had an episode where she had left eyelid ptosis and right lower facial droop.  She was previously diagnosed with Bell's palsy.  When she gets agitated, she reports that the right side of her mouth will droop and she may have difficulty opening the left eye.     She reports history of severe OSA   Past Medical History: Past Medical History:  Diagnosis Date   Allergic rhinitis 01/23/2008   Angina pectoris 06/06/2016   Arthritis    Attention deficit hyperactivity disorder (ADHD) 02/07/2016   Back pain, lumbar 01/21/2009   Bell's palsy  02/06/2008   Bipolar disorder 12/13/2009   Chronic idiopathic constipation    Dizziness 04/16/2008   Edema, leg 01/21/2009   Epigastric pain    Essential hypertension 01/23/2008   Gastro-esophageal reflux disease without esophagitis 01/23/2008   Generalized anxiety disorder 01/23/2008   Headache 04/15/2007   Hidradenitis 10/04/2008   History of colonic polyps    Hyperlipidemia    Hypokalemia 07/23/2018   Insomnia 01/23/2008   Major depressive disorder 01/23/2008   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   Neck pain 05/06/2017   Obesity (BMI 30-39.9) 11/10/2015   OSA (obstructive sleep apnea) 04/15/2007   inspire implant; does not always remember to turn device on   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   amphetamine-dextroamphetamine (ADDERALL XR) 10 MG 24 hr capsule Take 1 capsule (10 mg total) by mouth daily.  Ascorbic Acid (VITAMIN C) 1000 MG tablet Take 1,000 mg by mouth daily.   azelastine (ASTELIN) 0.1 % nasal spray PLACE 2 SPRAYS INTO BOTH NOSTRILS 2 TIMES DAILY AS DIRECTED   Cholecalciferol (VITAMIN D3) 125 MCG (5000 UT) CAPS Take 5,000 Units by mouth once a week.   FLUoxetine (PROZAC) 40 MG capsule Take 1 capsule (40 mg total) by mouth daily.   Glucosamine-Chondroitin (OSTEO BI-FLEX REGULAR STRENGTH PO) Take 1 tablet by mouth daily.   Magnesium 500 MG CAPS Take 500 mg by mouth daily.   melatonin 3 MG TABS tablet Take 3 mg by mouth at bedtime.   Multiple Vitamin (MULTIVITAMIN WITH MINERALS) TABS tablet Take 2 tablets by mouth daily.   omeprazole (PRILOSEC) 20 MG capsule  Take 1 capsule (20 mg total) by mouth at bedtime.   polyethylene glycol (MIRALAX / GLYCOLAX) 17 g packet Take 17 g by mouth daily as needed.   potassium chloride (KLOR-CON) 10 MEQ tablet TAKE 1 TABLET BY MOUTH DAILY.   Probiotic Product (PROBIOTIC PO) Take 1 capsule by mouth daily.    rosuvastatin (CRESTOR) 40 MG tablet Take 1 tablet (40 mg total) by mouth daily.   traMADol (ULTRAM) 50 MG tablet Take 2 tablets (100 mg total) by mouth every 8 (eight) hours as needed.   VITAMIN E PO Take 1 tablet by mouth once a week.   No facility-administered encounter medications on file as of 08/16/2021.    Allergies: Allergies  Allergen Reactions   Sulfonamide Derivatives Swelling    Facial swelling   Flexeril [Cyclobenzaprine] Other (See Comments)    Dizziness, made meniere's disease worse    Hydromorphone Itching   Morphine And Related Nausea And Vomiting   Sulfamethoxazole Other (See Comments)    Causes swelling     Family History: Family History  Problem Relation Age of Onset   Dementia Mother    Hyperlipidemia Mother    Asthma Father    Hypertension Sister    Hyperlipidemia Sister    Coronary artery disease Sister    Thyroid disease Sister    Hypertension Brother    Hyperlipidemia Brother    Coronary artery disease Brother    Hypertension Sister    Hyperlipidemia Sister    Alcohol abuse Other        fhx   Arthritis Other        fhx   Coronary artery disease Other        fhx   Depression Other        fhx   Diabetes Other        fhx   Hyperlipidemia Other        fhx   Hypertension Other        fhx   Sudden death Other        fhx    Observations/Objective:   No acute distress.  Alert and oriented.  Speech fluent and not dysarthric.  Language intact.  Eyes orthophoric on primary gaze.  Face symmetric.   Follow Up Instructions:    -I discussed the assessment and treatment plan with the patient. The patient was provided an opportunity to ask questions and all were  answered. The patient agreed with the plan and demonstrated an understanding of the instructions.   The patient was advised to call back or seek an in-person evaluation if the symptoms worsen or if the condition fails to improve as anticipated.    Cira Servant, DO

## 2021-08-16 ENCOUNTER — Other Ambulatory Visit: Payer: Self-pay

## 2021-08-16 ENCOUNTER — Telehealth (INDEPENDENT_AMBULATORY_CARE_PROVIDER_SITE_OTHER): Payer: PPO | Admitting: Neurology

## 2021-08-16 ENCOUNTER — Encounter: Payer: Self-pay | Admitting: Neurology

## 2021-08-16 VITALS — Ht 62.0 in | Wt 167.0 lb

## 2021-08-16 DIAGNOSIS — R4189 Other symptoms and signs involving cognitive functions and awareness: Secondary | ICD-10-CM

## 2021-08-16 DIAGNOSIS — R42 Dizziness and giddiness: Secondary | ICD-10-CM

## 2021-08-16 DIAGNOSIS — F411 Generalized anxiety disorder: Secondary | ICD-10-CM | POA: Diagnosis not present

## 2021-08-21 ENCOUNTER — Telehealth: Payer: Self-pay | Admitting: Family Medicine

## 2021-08-21 NOTE — Telephone Encounter (Signed)
Pt is calling and would like to know if dr fry would put her on tramadol due back pain .Pt has emphysema . The tylenol is not working  ?[place], [place] Phone:  [phone]  ?Fax:  [phone]  ?  ? ?

## 2021-08-21 NOTE — Telephone Encounter (Signed)
Please advise 

## 2021-08-22 MED ORDER — TRAMADOL HCL 50 MG PO TABS: 50.0000 mg | ORAL_TABLET | Freq: Two times a day (BID) | ORAL | 2 refills | Status: DC | PRN

## 2021-08-22 NOTE — Telephone Encounter (Signed)
Done

## 2021-08-22 NOTE — Telephone Encounter (Signed)
Pharmacy will notify patient when prescription ready for pickup. ?

## 2021-08-25 ENCOUNTER — Other Ambulatory Visit: Payer: Self-pay | Admitting: Family Medicine

## 2021-08-25 NOTE — Telephone Encounter (Signed)
Pt LOV was 05/01/2021 ?Last refill done on 02/23/2021 ?Please advise ?

## 2021-08-28 ENCOUNTER — Other Ambulatory Visit: Payer: Self-pay | Admitting: Family Medicine

## 2021-09-05 ENCOUNTER — Ambulatory Visit: Payer: PPO | Admitting: Cardiology

## 2021-09-05 ENCOUNTER — Encounter: Payer: Self-pay | Admitting: Cardiology

## 2021-09-05 ENCOUNTER — Other Ambulatory Visit: Payer: Self-pay

## 2021-09-05 VITALS — BP 130/72 | HR 66 | Ht 61.0 in | Wt 174.0 lb

## 2021-09-05 DIAGNOSIS — G4733 Obstructive sleep apnea (adult) (pediatric): Secondary | ICD-10-CM

## 2021-09-05 DIAGNOSIS — I10 Essential (primary) hypertension: Secondary | ICD-10-CM

## 2021-09-05 NOTE — Progress Notes (Signed)
? ? ?Date:  09/05/2021  ? ?ID:  [name] [name], DOB 06/25/1955, MRN [id number] ?The patient was identified using 2 identifiers. ? ?PCP:  [name] Morale, MD ?  ?[facility] Providers ?Cardiologist:  None    ? ?Evaluation Performed:  Follow-Up Visit ? ?Chief Complaint:  OSA ? ?History of Present Illness:   ? ?[name] [name] is a 66 y.o. female with a history of ADHD, bipolar disorder, hypertension, GERD, hyperlipidemia, obesity.  She has a hx of OSA on CPAP therapy.  Unfortunately she was intolerant to CPAP and requested ENT evaluation of  Inspire. Device.  She underwent hypoglossal nerve stimulator implant by Dr. Redmond [name] on 03/25/2019.   ? ?She was seen back on 05/01/2019 for device activation.  She was then seen in the office on 10/06/2019 and was complaining of feeling very sleepy and felt that her device need to be reprogrammed.  She was having a lot of daytime sleepiness as well as snoring.  Nerve stimulation was performed and her amplitude was increased from 1.7 to 2.5 V.  Her control range was changed to 2.3 to 3.3 V.  A few weeks later she had an in lab study done 04/30/2020 showed which showed no significant obstructive sleep apnea using her inspire device.   ? ?I then saw her back 07/31/2021 and she was doing great with her inspire device and was on level 5 which was 2.7 V.  She was feeling rested when she get up in the morning and had no daytime sleepiness.  She had no tongue sensitivity and her husband said she was very lightly snoring.  At that time an [employer] sleep study was ordered which showed moderate obstructive sleep apnea with an AHI of 17.9/h and O2 sats as low as 81%. ? ?She is now brought back into the office for further evaluation due to an increase in her AHI.  The patient now is really struggling with her inspire device and has not been able to sleep at night.  She says that she has severe sore throat when she wakes up in the morning.  She says she feels like her tongue is all the way  out of her mouth all night and very painful in the morning. ? ?Past Medical History:  ?Diagnosis Date  ? Allergic rhinitis 01/23/2008  ? Angina pectoris 06/06/2016  ? Arthritis   ? Attention deficit hyperactivity disorder (ADHD) 02/07/2016  ? Back pain, lumbar 01/21/2009  ? Bell's palsy 02/06/2008  ? Bipolar disorder 12/13/2009  ? Chronic idiopathic constipation   ? Dizziness 04/16/2008  ? Edema, leg 01/21/2009  ? Epigastric pain   ? Essential hypertension 01/23/2008  ? Gastro-esophageal reflux disease without esophagitis 01/23/2008  ? Generalized anxiety disorder 01/23/2008  ? Headache 04/15/2007  ? Hidradenitis 10/04/2008  ? History of colonic polyps   ? Hyperlipidemia   ? Hypokalemia 07/23/2018  ? Insomnia 01/23/2008  ? Major depressive disorder 01/23/2008  ? M?ni?re's disease 08/07/2018  ? Neck pain 05/06/2017  ? Obesity (BMI 30-39.9) 11/10/2015  ? OSA (obstructive sleep apnea) 04/15/2007  ? inspire implant; does not always remember to turn device on  ? 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 Qualifier: Diagnosis of  By: Sarajane Jews MD, Annie Main A   ? PUD (peptic ulcer disease) 06/06/2016  ? Unspecified urinary incontinence 01/23/2008  ? Wears dentures   ? Wears glasses   ? ?Past Surgical History:  ?Procedure Laterality Date  ? ABDOMINAL HYSTERECTOMY    ? ANTERIOR FUSION CERVICAL SPINE  2006  ?  Dr. Saintclair Halsted  ? [place]  ? benign  ? BREAST EXCISIONAL BIOPSY Right 1981  ? benign   scar does not show  ? BREAST SURGERY Bilateral 12-11-12  ? reductions per Dr. Julious Oka in [facility]   ? CATARACT EXTRACTION, BILATERAL Bilateral last summer of 2018  ? at [facility]  ? COLONOSCOPY  04/25/2016  ? per Dr. Collene Mares, adenomatous polyps, repeat in 5 yrs   ? DRUG INDUCED ENDOSCOPY N/A 01/30/2019  ? Procedure: DRUG INDUCED ENDOSCOPY;  Surgeon: Melida Quitter, MD;  Location: [place];  Service: ENT;  Laterality: N/A;  ? ESOPHAGOGASTRODUODENOSCOPY  04-23-11  ? per Dr. Collene Mares, normal   ? EYE SURGERY  06/18/2016  ? L eyelid    ? IMPLANTATION OF HYPOGLOSSAL NERVE STIMULATOR Right 03/25/2019  ? Procedure: IMPLANTATION OF HYPOGLOSSAL NERVE STIMULATOR;  Surgeon: Melida Quitter, MD;  Location: [place];  Service: ENT;  Laterality: Right;  ? [place] SURGERY  2003  ? Dr. Saintclair Halsted  ? MULTIPLE TOOTH EXTRACTIONS    ? ORIF WRIST FRACTURE Right 08/29/2018  ? REDUCTION MAMMAPLASTY Bilateral 2013  ? SPLIT NIGHT STUDY  11/11/2015  ? UVULOPALATOPHARYNGOPLASTY    ? VAGINAL HYSTERECTOMY  12/96  ? secondary to fibroids.  ?  ? ?Current Meds  ?Medication Sig  ? acyclovir (ZOVIRAX) 400 MG tablet Take 400 mg by mouth once as needed (breakout blisters).  ? Ascorbic Acid (VITAMIN C) 1000 MG tablet Take 1,000 mg by mouth daily.  ? Cholecalciferol (VITAMIN D3) 125 MCG (5000 UT) CAPS Take 5,000 Units by mouth once a week.  ? FLUoxetine (PROZAC) 40 MG capsule Take 1 capsule (40 mg total) by mouth daily.  ? Magnesium 500 MG CAPS Take 500 mg by mouth daily.  ? melatonin 3 MG TABS tablet Take 3 mg by mouth at bedtime.  ? Multiple Vitamin (MULTIVITAMIN WITH MINERALS) TABS tablet Take 2 tablets by mouth daily.  ? omeprazole (PRILOSEC) 20 MG capsule Take 1 capsule (20 mg total) by mouth at bedtime.  ? polyethylene glycol (MIRALAX / GLYCOLAX) 17 g packet Take 17 g by mouth daily as needed.  ? potassium chloride (KLOR-CON) 10 MEQ tablet TAKE 1 TABLET BY MOUTH DAILY.  ? Probiotic Product (PROBIOTIC PO) Take 1 capsule by mouth daily.   ? rosuvastatin (CRESTOR) 40 MG tablet Take 40 mg by mouth daily.  ? traMADol (ULTRAM) 50 MG tablet Take 1 tablet (50 mg total) by mouth every 12 (twelve) hours as needed for severe pain.  ? VITAMIN E PO Take 1 tablet by mouth once a week.  ?  ? ?Allergies:   Sulfonamide derivatives, Flexeril [cyclobenzaprine], Hydromorphone, Morphine and related, and Sulfamethoxazole  ? ?Social History  ? ?Tobacco Use  ? Smoking status: Former  ?  Years: 30.00  ?  Types: Cigarettes  ?  Quit date: 12/2020  ?  Years since quitting: 0.7  ? Smokeless tobacco: Never   ?Vaping Use  ? Vaping Use: Never used  ?Substance Use Topics  ? Alcohol use: Not Currently  ? Drug use: No  ?  ? ?Family Hx: ?The patient's family history includes Alcohol abuse in an other family member; Arthritis in an other family member; Asthma in her father; Coronary artery disease in her brother, sister, and another family member; Dementia in her mother; Depression in an other family member; Diabetes in an other family member; Hyperlipidemia in her brother, mother, sister, sister, and another family member; Hypertension in her brother, sister, sister, and another  family member; Sudden death in an other family member; Thyroid disease in her sister. ? ?ROS:   ?Please see the history of present illness.    ? ?All other systems reviewed and are negative. ? ? ?Prior CV studies:   ?The following studies were reviewed today: ? ?Home sleep study ? ?Labs/Other Tests and Data Reviewed:   ? ?EKG:  No ECG reviewed. ? ?Recent Labs: ?05/01/2021: ALT 21; Hemoglobin 12.9; Platelets 178.0; TSH 2.46 ?06/22/2021: BUN 13; Creatinine, Ser 0.70; Potassium 4.3; Sodium 140  ? ?Recent Lipid Panel ?Lab Results  ?Component Value Date/Time  ? CHOL 240 (H) 05/01/2021 10:15 AM  ? TRIG 131.0 05/01/2021 10:15 AM  ? HDL 65.10 05/01/2021 10:15 AM  ? CHOLHDL 4 05/01/2021 10:15 AM  ? LDLCALC 149 (H) 05/01/2021 10:15 AM  ? LDLCALC 124 (H) 03/17/2020 11:52 AM  ? LDLDIRECT 146.0 03/10/2018 10:35 AM  ? ? ?Wt Readings from Last 3 Encounters:  ?09/05/21 174 lb (78.9 kg)  ?08/16/21 167 lb (75.8 kg)  ?07/31/21 167 lb 12.8 oz (76.1 kg)  ?  ? ?Risk Assessment/Calculations:   ?  ? ?    ?Objective:   ? ?Vital Signs:  BP 130/72 (BP Location: Right Arm, Patient Position: Sitting, Cuff Size: Normal)   Pulse 66   Ht 5\' 1"  (1.549 m)   Wt 174 lb (78.9 kg)   SpO2 98%   BMI 32.88 kg/m?   ?GEN: Well nourished, well developed in no acute distress ?HEENT: Normal ?NECK: No JVD; No carotid bruits ?LYMPHATICS: No lymphadenopathy ?CARDIAC:RRR, no murmurs, rubs,  gallops ?RESPIRATORY:  Clear to auscultation without rales, wheezing or rhonchi  ?ABDOMEN: Soft, non-tender, non-distended ?MUSCULOSKELETAL:  No edema; No deformity  ?SKIN: Warm and dry ?NEUROLOGIC:  Alert and oriente

## 2021-09-05 NOTE — Patient Instructions (Addendum)
Medication Instructions:  Your physician recommends that you continue on your current medications as directed. Please refer to the Current Medication list given to you today.  *If you need a refill on your cardiac medications before your next appointment, please call your pharmacy*   Follow-Up: At [facility], you and your health needs are our priority.  As part of our continuing mission to provide you with exceptional heart care, we have created designated Provider Care Teams.  These Care Teams include your primary Cardiologist (physician) and Advanced Practice Providers (APPs -  Physician Assistants and Nurse Practitioners) who all work together to provide you with the care you need, when you need it.  Your next appointment:   4 week(s)  The format for your next appointment:   Virtual Visit   Provider:   Mckala Pantaleon, MD    

## 2021-09-11 ENCOUNTER — Telehealth: Payer: Self-pay | Admitting: Pharmacist

## 2021-09-11 NOTE — Chronic Care Management (AMB) (Signed)
? ? ?  Chronic Care Management ?Pharmacy Assistant  ? ?Name: [name] [name]  MRN: [id number] DOB: [birth year] ? ? ?09/11/21 APPOINTMENT REMINDER ? ? ?Patient was reminded to have all medications, supplements and any blood glucose and blood pressure readings available for review with Gaylord Shih, Pharm. D, for telephone visit on 09/12/21 at 2. ? ? ? ?Care Gaps: ?COVID Booster - Overdue ?AWV- 2/22 ?BP- 130/72 ( 09/05/20) ? ?Star Rating Drug: ?Rosuvastatin 40 mg - Last filled 08/21/21 90 DS at [facility] ? ?Any gaps in medications fill history? ?None  ? ?Medications: ?Outpatient Encounter Medications as of 09/11/2021  ?Medication Sig  ? acyclovir (ZOVIRAX) 400 MG tablet Take 400 mg by mouth once as needed (breakout blisters).  ? Ascorbic Acid (VITAMIN C) 1000 MG tablet Take 1,000 mg by mouth daily.  ? Cholecalciferol (VITAMIN D3) 125 MCG (5000 UT) CAPS Take 5,000 Units by mouth once a week.  ? FLUoxetine (PROZAC) 40 MG capsule Take 1 capsule (40 mg total) by mouth daily.  ? Magnesium 500 MG CAPS Take 500 mg by mouth daily.  ? melatonin 3 MG TABS tablet Take 3 mg by mouth at bedtime.  ? Multiple Vitamin (MULTIVITAMIN WITH MINERALS) TABS tablet Take 2 tablets by mouth daily.  ? omeprazole (PRILOSEC) 20 MG capsule Take 1 capsule (20 mg total) by mouth at bedtime.  ? polyethylene glycol (MIRALAX / GLYCOLAX) 17 g packet Take 17 g by mouth daily as needed.  ? potassium chloride (KLOR-CON) 10 MEQ tablet TAKE 1 TABLET BY MOUTH DAILY.  ? Probiotic Product (PROBIOTIC PO) Take 1 capsule by mouth daily.   ? rosuvastatin (CRESTOR) 40 MG tablet Take 40 mg by mouth daily.  ? traMADol (ULTRAM) 50 MG tablet Take 1 tablet (50 mg total) by mouth every 12 (twelve) hours as needed for severe pain.  ? VITAMIN E PO Take 1 tablet by mouth once a week.  ? ?No facility-administered encounter medications on file as of 09/11/2021.  ? ? ?Pamala Duffel CMA ?Clinical Pharmacist Assistant ?[phone] ? ?

## 2021-09-12 ENCOUNTER — Ambulatory Visit (INDEPENDENT_AMBULATORY_CARE_PROVIDER_SITE_OTHER): Payer: PPO | Admitting: Pharmacist

## 2021-09-12 DIAGNOSIS — I10 Essential (primary) hypertension: Secondary | ICD-10-CM

## 2021-09-12 DIAGNOSIS — E785 Hyperlipidemia, unspecified: Secondary | ICD-10-CM

## 2021-09-12 NOTE — Progress Notes (Signed)
? ?Chronic Care Management ?Pharmacy Note ? ?09/12/2021 ?Name:  [name] [name] MRN:  [id number] DOB:  November 28, 1955 ? ?Summary: ?LDL not at goal < 100 ?BP is at goal < 130/80 per home readings ?  ?Recommendations/Changes made from today's visit: ?-Recommended bringing BP cuff to next office visit to ensure accuracy ?-Recommended repeat lipid panel ?-Requested order for repeat chest CT ? ?  ?Plan: ?Schedule PCP follow up within 1 month for repeat lipid panel and HTN management ? ?Subjective: ?[name] [name] is an 66 y.o. year old female who is a primary patient of Laurey Morale, MD.  The CCM team was consulted for assistance with disease management and care coordination needs.   ? ?Engaged with patient by telephone for follow up visit in response to provider referral for pharmacy case management and/or care coordination services.  ? ?Consent to Services:  ?The patient was given information about Chronic Care Management services, agreed to services, and gave verbal consent prior to initiation of services.  Please see initial visit note for detailed documentation.  ? ?Patient Care Team: ?Laurey Morale, MD as PCP - General ?Sueanne Margarita, MD as Consulting Physician (Cardiology) ?Viona Gilmore, [facility] as Pharmacist (Pharmacist) ? ?Recent office visits: ?07/26/21 Rolene Arbour, LPN: Patient presented for AWV. ? ?05/01/21 Alysia Penna, MD: Patient presented for elevated BP and anxiety follow up. Increased Prozac to 40 mg daily and started Adderall XR 10 mg daily for ADHD. ? ?Recent consult visits: ?09/05/21 Fransico Him, MD (cardiology): Patient presented for OSA follow up. Adjusted setting on inspire device. ? ?08/16/21 Metta Clines, DO (neurology): Patient presented for chronic dizziness and memory deficits. Recommended PRN follow up. ? ?07/31/21 Fransico Him, MD (cardiology): Patient presented for OSA follow up. Plan for Itamar sleep study. ? ?05/19/21 Juanita Craver (Gastroenterology) - Patient was seen for Colonoscopy.  No medication changes ?  ?05/17/21 Lavonia Dana (Radiology) Patient presented for Diagnostic Radiology. No other visit details available.  ? ? ?Hospital visits: ?07/28/20 Patient presented to the ED for suicidal thoughts. ? ?Objective: ? ?Lab Results  ?Component Value Date  ? CREATININE 0.70 06/22/2021  ? BUN 13 06/22/2021  ? GFR 90.38 06/22/2021  ? GFRNONAA >60 07/28/2020  ? GFRAA >60 03/23/2019  ? NA 140 06/22/2021  ? K 4.3 06/22/2021  ? CALCIUM 9.8 06/22/2021  ? CO2 31 06/22/2021  ? GLUCOSE 102 (H) 06/22/2021  ? ? ?Lab Results  ?Component Value Date/Time  ? HGBA1C 6.1 05/01/2021 10:15 AM  ? HGBA1C 5.5 03/17/2020 11:52 AM  ? GFR 90.38 06/22/2021 10:47 AM  ? GFR 83.28 05/01/2021 10:15 AM  ?  ?Last diabetic Eye exam: No results found for: HMDIABEYEEXA  ?Last diabetic Foot exam: No results found for: HMDIABFOOTEX  ? ?Lab Results  ?Component Value Date  ? CHOL 240 (H) 05/01/2021  ? HDL 65.10 05/01/2021  ? LDLCALC 149 (H) 05/01/2021  ? LDLDIRECT 146.0 03/10/2018  ? TRIG 131.0 05/01/2021  ? CHOLHDL 4 05/01/2021  ? ? ? ?  Latest Ref Rng & Units 05/01/2021  ? 10:15 AM 07/28/2020  ?  7:53 PM 03/17/2020  ? 11:52 AM  ?Hepatic Function  ?Total Protein 6.0 - 8.3 g/dL 8.0   8.4   7.3    ?Albumin 3.5 - 5.2 g/dL 4.7   4.5     ?AST 0 - 37 U/L 26   58   27    ?ALT 0 - 35 U/L _0 ?Alk  Phosphatase 39 - 117 U/L 63   65     ?Total Bilirubin 0.2 - 1.2 mg/dL 0.3   0.9   0.5    ?Bilirubin, Direct 0.0 - 0.3 mg/dL 0.0    0.1    ? ? ?Lab Results  ?Component Value Date/Time  ? TSH 2.46 05/01/2021 10:15 AM  ? TSH 0.98 03/17/2020 11:52 AM  ? ? ? ?  Latest Ref Rng & Units 05/01/2021  ? 10:15 AM 07/28/2020  ?  7:53 PM 03/17/2020  ? 11:52 AM  ?CBC  ?WBC 4.0 - 10.5 K/uL 6.7   11.2   5.4    ?Hemoglobin 12.0 - 15.0 g/dL 12.9   12.9   12.8    ?Hematocrit 36.0 - 46.0 % 38.4   38.6   38.3    ?Platelets 150.0 - 400.0 K/uL 178.0   211   184    ? ? ?Lab Results  ?Component Value Date/Time  ? VD25OH 54 03/17/2020 11:52 AM  ? VD25OH 52 04/20/2013 02:59  PM  ? ? ?Clinical ASCVD: No  ?The 10-year ASCVD risk score (Arnett DK, et al., 2019) is: 11.6% ?  Values used to calculate the score: ?    Age: 66 years ?    Sex: Female ?    Is Non-Hispanic African American: No ?    Diabetic: No ?    Tobacco smoker: Yes ?    Systolic Blood Pressure: 779 mmHg ?    Is BP treated: No ?    HDL Cholesterol: 65.1 mg/dL ?    Total Cholesterol: 240 mg/dL   ? ? ?  07/26/2021  ?  3:36 PM 07/20/2020  ?  3:24 PM 06/08/2020  ?  1:34 PM  ?Depression screen PHQ 2/9  ?Decreased Interest 3 0 3  ?Down, Depressed, Hopeless 3 0 3  ?PHQ - 2 Score 6 0 6  ?Altered sleeping 3  1  ?Tired, decreased energy 3  3  ?Change in appetite 0  3  ?Feeling bad or failure about yourself  0  3  ?Trouble concentrating 3  2  ?Moving slowly or fidgety/restless 3  1  ?Suicidal thoughts 0  1  ?PHQ-9 Score 18  20  ?Difficult doing work/chores Somewhat difficult  Very difficult  ?  ? ? ?Social History  ? ?Tobacco Use  ?Smoking Status Former  ? Years: 30.00  ? Types: Cigarettes  ? Quit date: 12/2020  ? Years since quitting: 0.7  ?Smokeless Tobacco Never  ? ?BP Readings from Last 3 Encounters:  ?09/05/21 130/72  ?05/01/21 140/90  ?11/11/20 116/75  ? ?Pulse Readings from Last 3 Encounters:  ?09/05/21 66  ?05/01/21 71  ?11/11/20 79  ? ?Wt Readings from Last 3 Encounters:  ?09/05/21 174 lb (78.9 kg)  ?08/16/21 167 lb (75.8 kg)  ?07/31/21 167 lb 12.8 oz (76.1 kg)  ? ?BMI Readings from Last 3 Encounters:  ?09/05/21 32.88 kg/m?  ?08/16/21 30.54 kg/m?  ?07/31/21 30.69 kg/m?  ? ? ?Assessment/Interventions: Review of patient past medical history, allergies, medications, health status, including review of consultants reports, laboratory and other test data, was performed as part of comprehensive evaluation and provision of chronic care management services.  ? ?SDOH:  (Social Determinants of Health) assessments and interventions performed: No ? ?SDOH Screenings  ? ?Alcohol Screen: Low Risk   ? Last Alcohol Screening Score (AUDIT): 0   ?Depression (PHQ2-9): Medium Risk  ? PHQ-2 Score: 18  ?Financial Resource Strain: Low Risk   ? Difficulty of  Paying Living Expenses: Not hard at all  ?Food Insecurity: No Food Insecurity  ? Worried About Charity fundraiser in the Last Year: Never true  ? Ran Out of Food in the Last Year: Never true  ?Housing: Low Risk   ? Last Housing Risk Score: 0  ?Physical Activity: Insufficiently Active  ? Days of Exercise per Week: 3 days  ? Minutes of Exercise per Session: 10 min  ?Social Connections: Socially Integrated  ? Frequency of Communication with Friends and Family: More than three times a week  ? Frequency of Social Gatherings with Friends and Family: More than three times a week  ? Attends Religious Services: 1 to 4 times per year  ? Active Member of Clubs or Organizations: Yes  ? Attends Archivist Meetings: 1 to 4 times per year  ? Marital Status: Married  ?Stress: Stress Concern Present  ? Feeling of Stress : Very much  ?Tobacco Use: Medium Risk  ? Smoking Tobacco Use: Former  ? Smokeless Tobacco Use: Never  ? Passive Exposure: Not on file  ?Transportation Needs: No Transportation Needs  ? Lack of Transportation (Medical): No  ? Lack of Transportation (Non-Medical): No  ? ? ?CCM Care Plan ? ?Allergies  ?Allergen Reactions  ? Sulfonamide Derivatives Swelling  ?  Facial swelling  ? Flexeril [Cyclobenzaprine] Other (See Comments)  ?  Dizziness, made meniere's disease worse ?  ? Hydromorphone Itching  ? Morphine And Related Nausea And Vomiting  ? Sulfamethoxazole Other (See Comments)  ?  Causes swelling   ? ? ?Medications Reviewed Today   ? ? Reviewed by Viona Gilmore, [place] (Pharmacist) on 09/12/21 at [place] List Status: <None>  ? ?Medication Order Taking? Sig Documenting Provider Last Dose Status Informant  ?acyclovir (ZOVIRAX) 400 MG tablet [id number]  Take 400 mg by mouth once as needed (breakout blisters). [provider]  Active   ?alprazolam (XANAX) 2 MG tablet [id number] Yes Take 2 mg  by mouth 3 (three) times daily as needed for sleep. [provider] Taking Active   ?Ascorbic Acid (VITAMIN C) 1000 MG tablet [id number]  Take 1,000 mg by mouth daily. [provider]  Active Jerilynn Mages

## 2021-09-12 NOTE — Patient Instructions (Signed)
Hi Kay, ? ?It was great to catch up with you again! Keep on working on taking care of yourself and checking your blood pressure at home as you have been. ? ?Please reach out to me if you have any questions or need anything! ? ?Best, ?[name] ? ?Gaylord Shih, PharmD, BCACP ?Clinical Pharmacist ?Nature conservation officer at [place] ?[phone] ? ? Visit Information ? ? Goals Addressed   ?None ?  ? ?Patient Care Plan: CCM Pharmacy Care Plan  ?  ? ?Problem Identified: Problem: Hypertension, Hyperlipidemia, GERD, Depression, Anxiety, Allergic Rhinitis and ADHD and back pain   ?  ? ?Long-Range Goal: Patient-Specific Goal   ?Start Date: 09/21/2020  ?Expected End Date: 09/21/2021  ?Recent Progress: On track  ?Priority: High  ?Note:   ?Current Barriers:  ?Unable to independently monitor therapeutic efficacy ?Unable to achieve control of cholesterol  ?Unable to self administer medications as prescribed ? ?Pharmacist Clinical Goal(s):  ?Patient will achieve adherence to monitoring guidelines and medication adherence to achieve therapeutic efficacy ?achieve control of cholesterol as evidenced by next lipid panel  through collaboration with PharmD and provider.  ? ?Interventions: ?1:1 collaboration with Nelwyn Salisbury, MD regarding development and update of comprehensive plan of care as evidenced by provider attestation and co-signature ?Inter-disciplinary care team collaboration (see longitudinal plan of care) ?Comprehensive medication review performed; medication list updated in electronic medical record ? ?Hypertension (BP goal <140/90) ?-Controlled ?-Current treatment: ?No medications ?-Medications previously tried: n/a  ?-Current home readings: 119/68 (brand new arm cuff - usually in the yellow 120-129 or blue is < 120) ?-Current dietary habits: been eating more fruit and vegetables and not eating out at all ?-Current exercise habits: would like to start walking and recommended trying yoga on youtube to improve balance ?-Denies  hypotensive/hypertensive symptoms ?-Educated on Daily salt intake goal < 2300 mg; ?Exercise goal of 150 minutes per week; ?-Counseled to monitor BP at home as directed, document, and provide log at future appointments ?-Counseled on diet and exercise extensively ?-Recommended bring her BP cuff to next office visit to ensure accuracy. ? ?Hyperlipidemia: (LDL goal < 100) ?-Uncontrolled ?-Current treatment: ?Rosuvastatin 40mg , 1 tablet once daily  - Appropriate, Query effective, Safe, Accessible ?-Medications previously tried: none  ?-Current dietary patterns: doing much better with diet; not eating out at all and eating lots of fruits. Seafood and stir fry vegetables ?-Current exercise habits: no consistent exercise ?-Educated on Cholesterol goals;  ?Benefits of statin for ASCVD risk reduction; ?Importance of limiting foods high in cholesterol; ?Exercise goal of 150 minutes per week; ?-Counseled on diet and exercise extensively ?Recommended repeat lipid panel. ? ?Depression/Anxiety/bipolar disorder (Goal: minimize symptoms) ?-Not ideally controlled ?-Current treatment: ?fluoxetine 40 mg, 1 tablet once daily - Appropriate, Effective, Safe, Accessible ?alprazolam 2mg , 1 tablet three times daily as needed (patient reports sometimes not taking once a day) - Appropriate, Effective, Query Safe, Accessible ?-Medications previously tried/failed: Seroquel, Lithium ?-PHQ9: 0 ?-GAD7: n/a ?-Connected with Health phone number [phone]) for mental health support ?-Educated on Benefits of medication for symptom control ?Benefits of cognitive-behavioral therapy with or without medication ?-Counseled on worsening of sleep apnea with use of alprazolam. ? ?Insomnia (Goal: improve quality and quantity of sleep) ?-Controlled ?-Current treatment  ?Inspire device  ?Melatonin 10 mg liquid at bedtime - Appropriate, Effective, Query Safe, Accessible ?Hydroxyzine 50 mg 1 tablet at bedtime as needed - Appropriate,  Effective, Safe, Accessible ?-Medications previously tried: Seroquel, Zzquil  ?-Counseled on maximum of 10 mg per night of melatonin ? ?  Allergic rhinitis (Goal: minimize symptoms) ?-Controlled ?-Current treatment  ?azelastine (Astelin) 0.1% nasal spray, 2 sprays into both nostrils twice daily as directed - Appropriate, Effective, Safe, Accessible ?-Medications previously tried: none  ?-Recommended to continue current medication ? ?Arthritis pain (Goal: minimize pain) ?-Controlled ?-Current treatment  ?Osteo Bi-flex (glucosamine condroitin & vitamin D) 2000 units daily ?-Medications previously tried: none  ?-Counseled on giving the medicine a few weeks trial before deciding if it is helping; discussed stopping vitamin D supplementation with this as the replacement ? ?GERD (Goal: minimize symptoms) ?-Controlled ?-Current treatment  ?Omeprazole 20 mg 1 capsule twice daily ?-Medications previously tried: none  ?-Recommended to continue current medication ? ?Low potassium (Goal: 3.5-5) ?-Controlled ?-Current treatment  ?Potassium chloride 10 mEq 1 tablet daily ?-Medications previously tried: none  ?-Recommended repeat potassium level ? ?Low back pain (Goal: minimize pain) ?-Not ideally controlled ?-Current treatment  ?Tramadol 50 mg 1 tablet every 12 hours as needed - Appropriate, Effective, Query Safe, Accessible ?-Medications previously tried: hydrocodone ?-Counseled on separating from other sedating medications ? ?ADHD (Goal: remain focused) ?-Controlled ?-Current treatment  ?No medications ?-Medications previously tried: Adderall (no longer needed) ?-Recommended to continue current medication ? ? ?Health Maintenance ?-Vaccine gaps: second dose of shingrix, COVID vaccine ?-Current therapy:  ?Vitamin E 1 tablet once a week ?Magnesium 500 mg daily ?Vitamin B complex 1 capsule daily ?Multivitamin 1 tablet daily ?Acyclovir 400 mg 1 tablet four times daily - patient only takes once daily ?-Educated on Cost vs benefit of  each product must be carefully weighed by individual consumer ?-Patient is satisfied with current therapy and denies issues ?-Recommended to continue current medication ? ?Patient Goals/Self-Care Activities ?Patient will:  ?- take medications as prescribed ?focus on medication adherence by filling out weekly pill box and marking calendar for doses taken ?target a minimum of 150 minutes of moderate intensity exercise weekly ? ?Follow Up Plan: The care management team will reach out to the patient again over the next 30 days.   ? ?  ?  ? ?Patient verbalizes understanding of instructions and care plan provided today and agrees to view in MyChart. Active MyChart status confirmed with patient.   ?The pharmacy team will reach out to the patient again over the next 7 days.  ? ?Verner Chol, RPH  ?

## 2021-09-15 DIAGNOSIS — I10 Essential (primary) hypertension: Secondary | ICD-10-CM

## 2021-09-15 DIAGNOSIS — E785 Hyperlipidemia, unspecified: Secondary | ICD-10-CM

## 2021-09-15 DIAGNOSIS — F319 Bipolar disorder, unspecified: Secondary | ICD-10-CM | POA: Diagnosis not present

## 2021-10-04 ENCOUNTER — Ambulatory Visit (INDEPENDENT_AMBULATORY_CARE_PROVIDER_SITE_OTHER)
Admission: RE | Admit: 2021-10-04 | Discharge: 2021-10-04 | Disposition: A | Discharge status: Home / Self Care | Payer: PPO | Source: Ambulatory Visit | Attending: Family Medicine | Admitting: Family Medicine

## 2021-10-04 DIAGNOSIS — J432 Centrilobular emphysema: Secondary | ICD-10-CM | POA: Diagnosis not present

## 2021-10-04 DIAGNOSIS — R911 Solitary pulmonary nodule: Secondary | ICD-10-CM | POA: Diagnosis not present

## 2021-10-04 MED ORDER — IOHEXOL 300 MG/ML  SOLN
80.0000 mL | Freq: Once | INTRAMUSCULAR | Status: AC | PRN
  Administered 2021-10-04: 80 mL via INTRAVENOUS

## 2021-10-09 ENCOUNTER — Other Ambulatory Visit: Payer: Self-pay | Admitting: Family Medicine

## 2021-10-09 ENCOUNTER — Telehealth: Payer: Self-pay | Admitting: Family Medicine

## 2021-10-09 NOTE — Telephone Encounter (Signed)
Last refill for Polyethylene 17 g packet was last send in by Historical provider. ? ?Last OV- 05/01/21 ?Is okay to send in another refill? ?

## 2021-10-09 NOTE — Telephone Encounter (Signed)
Pt called stating Pharm called to let her know that the Rx Polyethylene 17 g has been denied and pt doesn't know why. Pt would like a call back to see if she can get this Rx refilled and explanation. Laqueta Carina [phone] ? ?Please advise.  ?

## 2021-10-10 MED ORDER — POLYETHYLENE GLYCOL 3350 17 G PO PACK
PACK | ORAL | 3 refills | Status: AC
Start: 2021-10-10 — End: ?

## 2021-10-10 NOTE — Telephone Encounter (Signed)
Rx done and the patient was informed. ?

## 2021-10-10 NOTE — Telephone Encounter (Signed)
Yes, please call this in to use one packet daily, 90 day supply with 3 rf  ?

## 2021-10-18 ENCOUNTER — Telehealth: Payer: Self-pay | Admitting: *Deleted

## 2021-10-18 ENCOUNTER — Telehealth (INDEPENDENT_AMBULATORY_CARE_PROVIDER_SITE_OTHER): Payer: PPO | Admitting: Cardiology

## 2021-10-18 ENCOUNTER — Encounter: Payer: Self-pay | Admitting: Cardiology

## 2021-10-18 VITALS — BP 118/71 | HR 87 | Ht 61.0 in | Wt 167.0 lb

## 2021-10-18 DIAGNOSIS — I10 Essential (primary) hypertension: Secondary | ICD-10-CM

## 2021-10-18 DIAGNOSIS — G4733 Obstructive sleep apnea (adult) (pediatric): Secondary | ICD-10-CM | POA: Diagnosis not present

## 2021-10-18 NOTE — Telephone Encounter (Signed)
-----   Message from Tarri Fuller, CMA sent at 10/18/2021 11:13 AM EDT ----- ?Regarding: Itamar ?Hi ladies, ? ?Please see message from [place]. Victorino Dike, if you have time at some point would you please reach out to the pt for set up. I appreciate the help.  ? ? ?I am going to be out of the office from  12:30 today thru 10/25/21 8 am. I will send this message to Elliot Cousin, Tresa Endo and [place] for someone to reach out to the pt for set up.  ? ?Thank you  ?Okey Regal  ?----- Message ----- ?From: Theresia Majors, RN ?Sent: 10/18/2021  11:03 AM EDT ?To: Tarri Fuller, CMA ? ?Hello! ?Itamar sleep study was ordered for this patient during VV with Dr. Mayford Knife today. STOPBANG has been completed with a score of 3. Can you set up a time for her to come by and pick one up? She has done one before so should not need much assistance.  ?Thanks!!! ? ? ?

## 2021-10-18 NOTE — Addendum Note (Signed)
Addended by: Theresia Majors on: 10/18/2021 11:00 AM ? ? Modules accepted: Orders ? ?

## 2021-10-18 NOTE — Progress Notes (Signed)
?  STOP BANG RISK ASSESSMENT ?S (snore) Have you been told that you snore?     YES ?  ?T (tired) Are you often tired, fatigued, or sleepy during the day?  ? NO  ?O (obstruction) Do you stop breathing, choke, or gasp during sleep? YES ?  ?P (pressure) Do you have or are you being treated for high blood pressure? NO ?  ?B (BMI) Is your body index greater than 35 kg/m? NO ?  ?A (age) Are you 67 years old or older? YES ?  ?N (neck) Do you have a neck circumference greater than 16 inches?  ? NO ?  ?G (gender) Are you a female? NO ?  ?TOTAL STOP/BANG ?YES? ANSWERS 3  ? ?                                                                    For Office Use Only              Procedure Order Form    ?YES to 3+ Stop Bang questions OR two clinical symptoms - patient qualifies for WatchPAT (CPT 95800)     ?       ? ?Clinical Notes: Will consult Sleep Specialist and refer for management of therapy due to patient increased risk of Sleep Apnea. Ordering a sleep study due to the following two clinical symptoms: Excessive daytime sleepiness G47.10 / Gastroesophageal reflux K21.9 / Nocturia R35.1 / Morning Headaches G44.221 / Difficulty concentrating R41.840 / Memory problems or poor judgment G31.84 / Personality changes or irritability R45.4 / Loud snoring R06.83 / Depression F32.9 / Unrefreshed by sleep G47.8 / Impotence N52.9 / History of high blood pressure R03.0 / Insomnia G47.00  ? ? ?I understand that I am proceeding with a home sleep apnea test as ordered by my treating physician. I understand that untreated sleep apnea is a serious cardiovascular risk factor and it is my responsibility to perform the test and seek management for sleep apnea. I will be contacted with the results and be managed for sleep apnea by a local sleep physician. I will be receiving equipment and further instructions from [facility]. I shall promptly ship back the equipment via the included mailing label. I understand my insurance will be billed for the  test and as the patient I am responsible for any insurance related out-of-pocket costs incurred. I have been provided with written instructions and can call for additional video or telephonic instruction, with 24-hour availability of qualified personnel to answer any questions: Patient Help Desk [phone]. ? ?Patient Telemedicine Verbal Consent ? ? ? ?

## 2021-10-18 NOTE — Telephone Encounter (Signed)
Call placed to pt, she will come by the office, tomorrow, 10/19/21 at 11:00.  Pt was advised to ask for Garner Gavel, for sleep study. ? ? ?

## 2021-10-18 NOTE — Progress Notes (Signed)
? ?Virtual Visit via Video Note  ? ?This visit type was conducted due to national recommendations for restrictions regarding the COVID-19 Pandemic (e.g. social distancing) in an effort to limit this patient's exposure and mitigate transmission in our community.  Due to her co-morbid illnesses, this patient is at least at moderate risk for complications without adequate follow up.  This format is felt to be most appropriate for this patient at this time.  All issues noted in this document were discussed and addressed.  A limited physical exam was performed with this format.  Please refer to the patient's chart for her consent to telehealth for [facility]. ? ?Date:  10/18/2021  ? ?ID:  [name] [name], DOB 26-Apr-1956, MRN [id number] ?The patient was identified using 2 identifiers. ? ?PCP:  Laurey Morale, MD ?  ?[facility] Providers ?Cardiologist:  None    ? ?Evaluation Performed:  Follow-Up Visit ? ?Chief Complaint:  OSA ? ?History of Present Illness:   ? ?[name] [name] is a 66 y.o. female with a history of ADHD, bipolar disorder, hypertension, GERD, hyperlipidemia, obesity.  She has a hx of OSA on CPAP therapy.  Unfortunately she was intolerant to CPAP and requested ENT evaluation of  Inspire. Device.  She underwent hypoglossal nerve stimulator implant by Dr. Redmond Baseman on 03/25/2019.   ? ?She was seen back on 05/01/2019 for device activation.  She was then seen in the office on 10/06/2019 and was complaining of feeling very sleepy and felt that her device need to be reprogrammed.  She was having a lot of daytime sleepiness as well as snoring.  Nerve stimulation was performed and her amplitude was increased from 1.7 to 2.5 V.  Her control range was changed to 2.3 to 3.3 V.  A few weeks later she had an in lab study done 04/30/2020 showed which showed no significant obstructive sleep apnea using her inspire device.   ? ?I then saw her back 07/31/2021 and she was doing great with her inspire device and was on  level 5 which was 2.7 V.  She was feeling rested when she get up in the morning and had no daytime sleepiness.  She had no tongue sensitivity and her husband said she was very lightly snoring.  At that time an Itamar sleep study was ordered which showed moderate obstructive sleep apnea with an AHI of 17.9/h and O2 sats as low as 81%. ? ?She is now brought back into the office for further evaluation due to an increase in her AHI.  At last office visit she was had really struggling with her inspire device and has not been able to sleep at night.  She was having a severe sore throat when she would wake up up in the morning and felt like her tongue was all the way out of her mouth all night and very painful in the morning. ? ?At that office visit s interrogation of her device was performed and showed that her thresholds had decreased to 1 V and it was likely that she had had a decrease in stimulation threshold over time due to healing and did not require a high pacing threshold any longer and was resulting in overstimulation of the hypoglossal nerve which was changing her anatomy  resulting in more apneas. Her parameters were changed with the stimulation range of 1.4 V to 1.8 V.  Her goal was to get to level 2 or 3 which would be 1.5 to 1.6 V. ? ?She presents  for televisit today and is doing well.  She has increased her level to 5 which is 1.8V.  She is doing well on that level with no further snoring according to her husband.  She has not had any further sore throat or dry mouth.  She feels rested in the am and sleeping through the night without any awakenings.  She does not nap during the day.  ? ?Past Medical History:  ?Diagnosis Date  ? Allergic rhinitis 01/23/2008  ? Angina pectoris 06/06/2016  ? Arthritis   ? Attention deficit hyperactivity disorder (ADHD) 02/07/2016  ? Back pain, lumbar 01/21/2009  ? Bell's palsy 02/06/2008  ? Bipolar disorder 12/13/2009  ? Chronic idiopathic constipation   ? Dizziness 04/16/2008   ? Edema, leg 01/21/2009  ? Epigastric pain   ? Essential hypertension 01/23/2008  ? Gastro-esophageal reflux disease without esophagitis 01/23/2008  ? Generalized anxiety disorder 01/23/2008  ? Headache 04/15/2007  ? Hidradenitis 10/04/2008  ? History of colonic polyps   ? Hyperlipidemia   ? Hypokalemia 07/23/2018  ? Insomnia 01/23/2008  ? Major depressive disorder 01/23/2008  ? M?ni?re's disease 08/07/2018  ? Neck pain 05/06/2017  ? Obesity (BMI 30-39.9) 11/10/2015  ? OSA (obstructive sleep apnea) 04/15/2007  ? inspire implant; does not always remember to turn device on  ? 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 Qualifier: Diagnosis of  By: Sarajane Jews MD, Annie Main A   ? PUD (peptic ulcer disease) 06/06/2016  ? Unspecified urinary incontinence 01/23/2008  ? Wears dentures   ? Wears glasses   ? ?Past Surgical History:  ?Procedure Laterality Date  ? ABDOMINAL HYSTERECTOMY    ? ANTERIOR FUSION CERVICAL SPINE  2006  ? Dr. Saintclair Halsted  ? [place]  ? benign  ? BREAST EXCISIONAL BIOPSY Right 1981  ? benign   scar does not show  ? BREAST SURGERY Bilateral 12-11-12  ? reductions [facility] Julious [facility]   ? CATARACT EXTRACTION, BILATERAL Bilateral last summer of 2018  ? at [facility]  ? COLONOSCOPY  04/25/2016  ? per Dr. Collene Mares, adenomatous polyps, repeat in 5 yrs   ? DRUG INDUCED ENDOSCOPY N/A 01/30/2019  ? Procedure: DRUG INDUCED ENDOSCOPY;  Surgeon: Melida Quitter, MD;  Location: [place];  Service: ENT;  Laterality: N/A;  ? ESOPHAGOGASTRODUODENOSCOPY  04-23-11  ? per Dr. Collene Mares, normal   ? EYE SURGERY  06/18/2016  ? L eyelid   ? IMPLANTATION OF HYPOGLOSSAL NERVE STIMULATOR Right 03/25/2019  ? Procedure: IMPLANTATION OF HYPOGLOSSAL NERVE STIMULATOR;  Surgeon: Melida Quitter, MD;  Location: [place];  Service: ENT;  Laterality: Right;  ? [place] SURGERY  2003  ? Dr. Saintclair Halsted  ? MULTIPLE TOOTH EXTRACTIONS    ? ORIF WRIST FRACTURE Right 08/29/2018  ? REDUCTION MAMMAPLASTY Bilateral 2013  ? SPLIT NIGHT STUDY  11/11/2015   ? UVULOPALATOPHARYNGOPLASTY    ? VAGINAL HYSTERECTOMY  12/96  ? secondary to fibroids.  ?  ? ?Current Meds  ?Medication Sig  ? acyclovir (ZOVIRAX) 400 MG tablet Take 400 mg by mouth once as needed (breakout blisters).  ? alprazolam (XANAX) 2 MG tablet Take 2 mg by mouth 3 (three) times daily as needed for sleep.  ? Ascorbic Acid (VITAMIN C) 1000 MG tablet Take 1,000 mg by mouth daily.  ? Cholecalciferol (VITAMIN D3) 125 MCG (5000 UT) CAPS Take 5,000 Units by mouth once a week.  ? FLUoxetine (PROZAC) 40 MG capsule Take 1 capsule (40 mg total) by mouth daily.  ?  Magnesium 500 MG CAPS Take 500 mg by mouth daily.  ? melatonin 3 MG TABS tablet Take 3 mg by mouth at bedtime.  ? Multiple Vitamin (MULTIVITAMIN WITH MINERALS) TABS tablet Take 2 tablets by mouth daily.  ? omeprazole (PRILOSEC) 20 MG capsule TAKE 1 CAPSULE BY MOUTH 2 TIMES DAILY BEFORE A MEAL.  ? polyethylene glycol (MIRALAX / GLYCOLAX) 17 g packet Use one packet daily  ? potassium chloride (KLOR-CON) 10 MEQ tablet TAKE 1 TABLET BY MOUTH DAILY.  ? Probiotic Product (PROBIOTIC PO) Take 1 capsule by mouth daily.   ? rosuvastatin (CRESTOR) 40 MG tablet Take 40 mg by mouth daily.  ? traMADol (ULTRAM) 50 MG tablet Take 1 tablet (50 mg total) by mouth every 12 (twelve) hours as needed for severe pain.  ? VITAMIN E PO Take 1 tablet by mouth once a week.  ?  ? ?Allergies:   Sulfonamide derivatives, Flexeril [cyclobenzaprine], Hydromorphone, Morphine and related, and Sulfamethoxazole  ? ?Social History  ? ?Tobacco Use  ? Smoking status: Former  ?  Years: 30.00  ?  Types: Cigarettes  ?  Quit date: 12/2020  ?  Years since quitting: 0.8  ? Smokeless tobacco: Never  ?Vaping Use  ? Vaping Use: Never used  ?Substance Use Topics  ? Alcohol use: Not Currently  ? Drug use: No  ?  ? ?Family Hx: ?The patient's family history includes Alcohol abuse in an other family member; Arthritis in an other family member; Asthma in her father; Coronary artery disease in her brother, sister,  and another family member; Dementia in her mother; Depression in an other family member; Diabetes in an other family member; Hyperlipidemia in her brother, mother, sister, sister, and another family

## 2021-10-18 NOTE — Patient Instructions (Signed)
Medication Instructions:  ?Your physician recommends that you continue on your current medications as directed. Please refer to the Current Medication list given to you today. ? ?*If you need a refill on your cardiac medications before your next appointment, please call your pharmacy* ? ?Testing/Procedures: ?Your physician has recommended that you have a sleep study. This test records several body functions during sleep, including: brain activity, eye movement, oxygen and carbon dioxide blood levels, heart rate and rhythm, breathing rate and rhythm, the flow of air through your mouth and nose, snoring, body muscle movements, and chest and belly movement. ? ?Follow-Up: ?At [facility], you and your health needs are our priority.  As part of our continuing mission to provide you with exceptional heart care, we have created designated Provider Care Teams.  These Care Teams include your primary Cardiologist (physician) and Advanced Practice Providers (APPs -  Physician Assistants and Nurse Practitioners) who all work together to provide you with the care you need, when you need it. ? ?Follow up with Dr. Radford Pax after sleep study ? ?Important Information About Sugar ? ? ? ? ?  ?

## 2021-10-19 ENCOUNTER — Telehealth: Payer: Self-pay | Admitting: *Deleted

## 2021-10-19 NOTE — Telephone Encounter (Signed)
Returned pt's call, advised pt she can come in anytime on Monday, 10/23/21 in the a.m. ?I am in clinic but can bring pt back and get her taken care of. ?

## 2021-10-19 NOTE — Telephone Encounter (Signed)
Prior Authorization for [facility] sent to [facility] via web portal.  Authorization Decision: ?Approved ? Outpatient Authorization [id number]. ?

## 2021-10-19 NOTE — Telephone Encounter (Signed)
?  Pt said, she is sick today and would like to ask if she can pick up the equipment on Monday  ?

## 2021-10-25 ENCOUNTER — Telehealth: Payer: Self-pay | Admitting: *Deleted

## 2021-10-25 NOTE — Telephone Encounter (Signed)
-----   Message from Elliot Cousin, [place] sent at 10/25/2021  9:42 AM EDT ----- ?Regarding: RE: Donnie Coffin ?Just FYI, I had this pt coming in 2 different days and she cancelled because she was sick. ? ?She is supposed to call back when she's better to set another day to come in.  ?----- Message ----- ?From: Tarri Fuller, CMA ?Sent: 10/18/2021  11:17 AM EDT ?To: Deliah Boston, RN, Elliot Cousin, RMA, # ?Subject: Itamar                                        ? ?Hi ladies, ? ?Please see message from [place]. Victorino Dike, if you have time at some point would you please reach out to the pt for set up. I appreciate the help.  ? ? ?I am going to be out of the office from  12:30 today thru 10/25/21 8 am. I will send this message to Elliot Cousin, Tresa Endo and [place] for someone to reach out to the pt for set up.  ? ?Thank you  ?Okey Regal  ?----- Message ----- ?From: Theresia Majors, RN ?Sent: 10/18/2021  11:03 AM EDT ?To: Tarri Fuller, CMA ? ?Hello! ?Itamar sleep study was ordered for this patient during VV with Dr. Mayford Knife today. STOPBANG has been completed with a score of 3. Can you set up a time for her to come by and pick one up? She has done one before so should not need much assistance.  ?Thanks!!! ? ? ? ?

## 2021-11-09 NOTE — Telephone Encounter (Signed)
Michae Kava, CMA  Mychart, Generic Good afternoon Mrs. Unk Lightning,   I was just checking to see what day would be good for you to come by the office to be set up for the sleep study. Please call the office and ask to speak with Arbie Cookey (home sleep studies) and let me know when you want to come in.   Thank you  [facility]

## 2021-11-20 NOTE — Telephone Encounter (Signed)
I was able to s/w the pt today about Itamar sleep study. Pt does still want to do the sleep study, she just has been having trouble with Meniere's disease. She did ask if her husband could come and set this up for her. I said that will be fine. I will have the pt's husband sign the waiver and will upload the app onto his phone, which pt states she will be using his phone anyway. I have set up for her husband to come to our office and ask for [place]. Pt was grateful for the help in setting this up and allowing her husband to come set this up for her.

## 2021-11-22 NOTE — Telephone Encounter (Signed)
Pt's husband has come in and has picked up the new Itamar device. Pt's husband they still have the application on the phone.

## 2021-11-22 NOTE — Telephone Encounter (Addendum)
I s/w Gershon Cull, CMA to be sure if the  pt was still approved as this is a repeat study. Per Gershon Cull, CMA the pt is still approved. I will call the pt with the PIN # as I forgot to give it to the pt's husband today. Left message for the pt PIN# [id number]. If any questions please call the office. I will update Dr. Theodosia Blender nurse Carly the pt's husband has come by and picked up the new device.

## 2021-11-23 ENCOUNTER — Encounter (INDEPENDENT_AMBULATORY_CARE_PROVIDER_SITE_OTHER): Payer: PPO | Admitting: Cardiology

## 2021-11-23 DIAGNOSIS — G4733 Obstructive sleep apnea (adult) (pediatric): Secondary | ICD-10-CM

## 2021-11-24 NOTE — Procedures (Signed)
    SLEEP STUDY REPORT Patient Information Study Date: 11/23/21 Patient Name: [name] [name] Patient ID: [id number] Birth Date: Aug 14, 2055 Age: 66 Gender: Female BMI: 30.8 (W=167 lb, H=5' 2'') Neck Circ.: 16 '' Referring Physician: Armanda Magic, MD  TEST DESCRIPTION: Home sleep apnea testing was completed using the WatchPat, a Type 1 device, utilizing peripheral arterial tonometry (PAT), chest movement, actigraphy, pulse oximetry, pulse rate, body position and snore. AHI was calculated with apnea and hypopnea using valid sleep time as the denominator. RDI includes apneas, hypopneas, and RERAs. The data acquired and the scoring of sleep and all associated events were performed in accordance with the recommended standards and specifications as outlined in the AASM Manual for the Scoring of Sleep and Associated Events 2.2.0 (2015). Study was done on Inspire Device.  FINDINGS: 1. Mild Obstructive Sleep Apnea with AHI 7.3/hr. 2. No Central Sleep Apnea with pAHIc 1.5/hr. 3. Oxygen desaturations as low as 86%. 4. Mild to moderate snoring was present. O2 sats were < 88% for 0.2 min. 5. Total sleep time was 6 hrs and 22 min. 6. 10.3% of total sleep time was spent in REM sleep. 7. Shortened sleep onset latency at 10 min. 8. Shortened REM sleep onset latency at 40 min. 9. Total awakenings were 6.  DIAGNOSIS: Mild Obstructive Sleep Apnea (G47.33)  RECOMMENDATIONS: 1. Continue current Itamar settings.   2. Healthy sleep recommendations include: adequate nightly sleep (normal 7-9 hrs/night), avoidance of caffeine after noon and alcohol near bedtime, and maintaining a sleep environment that is cool, dark and quiet.  3. Weight loss for overweight patients is recommended. Even modest amounts of weight loss can significantly improve the severity of sleep apnea.  4. Snoring recommendations include: weight loss where appropriate, side sleeping, and avoidance of alcohol before bed.  5.  Operation of motor vehicle should be avoided when sleepy.  Signature: Electronically Signed: 11/24/21 Armanda Magic, MD; [facility]; Diplomat, American Board of Sleep Medicine

## 2021-11-28 ENCOUNTER — Other Ambulatory Visit: Payer: Self-pay | Admitting: Family Medicine

## 2021-11-29 ENCOUNTER — Other Ambulatory Visit: Payer: Self-pay | Admitting: Family Medicine

## 2021-11-29 NOTE — Telephone Encounter (Signed)
Last OV- 05/01/21 Last refill- 08/22/21--60 tabs, 2 refills  Physical- 12/13/21

## 2021-11-30 ENCOUNTER — Emergency Department (HOSPITAL_BASED_OUTPATIENT_CLINIC_OR_DEPARTMENT_OTHER): Payer: PPO

## 2021-11-30 ENCOUNTER — Emergency Department (HOSPITAL_BASED_OUTPATIENT_CLINIC_OR_DEPARTMENT_OTHER): Payer: PPO | Admitting: Radiology

## 2021-11-30 ENCOUNTER — Other Ambulatory Visit: Payer: Self-pay

## 2021-11-30 ENCOUNTER — Telehealth: Payer: Self-pay | Admitting: *Deleted

## 2021-11-30 ENCOUNTER — Encounter (HOSPITAL_BASED_OUTPATIENT_CLINIC_OR_DEPARTMENT_OTHER): Payer: Self-pay | Admitting: Emergency Medicine

## 2021-11-30 ENCOUNTER — Emergency Department (HOSPITAL_BASED_OUTPATIENT_CLINIC_OR_DEPARTMENT_OTHER)
Admission: EM | Admit: 2021-11-30 | Discharge: 2021-11-30 | Disposition: A | Discharge status: Home / Self Care | Payer: PPO | Attending: Emergency Medicine | Admitting: Emergency Medicine

## 2021-11-30 DIAGNOSIS — R079 Chest pain, unspecified: Secondary | ICD-10-CM | POA: Insufficient documentation

## 2021-11-30 DIAGNOSIS — R0602 Shortness of breath: Secondary | ICD-10-CM | POA: Diagnosis not present

## 2021-11-30 DIAGNOSIS — R1110 Vomiting, unspecified: Secondary | ICD-10-CM | POA: Diagnosis not present

## 2021-11-30 DIAGNOSIS — R059 Cough, unspecified: Secondary | ICD-10-CM | POA: Diagnosis not present

## 2021-11-30 DIAGNOSIS — R0789 Other chest pain: Secondary | ICD-10-CM | POA: Diagnosis not present

## 2021-11-30 LAB — BASIC METABOLIC PANEL
Anion gap: 9 (ref 5–15)
BUN: 16 mg/dL (ref 8–23)
CO2: 29 mmol/L (ref 22–32)
Calcium: 9.8 mg/dL (ref 8.9–10.3)
Chloride: 101 mmol/L (ref 98–111)
Creatinine, Ser: 0.71 mg/dL (ref 0.44–1.00)
GFR, Estimated: >60 mL/min (ref >60)
Glucose, Bld: 90 mg/dL (ref 70–99)
Potassium: 4 mmol/L (ref 3.5–5.1)
Sodium: 139 mmol/L (ref 135–145)

## 2021-11-30 LAB — URINALYSIS, ROUTINE W REFLEX MICROSCOPIC
Bilirubin Urine: NEGATIVE
Glucose, UA: NEGATIVE mg/dL
Hgb urine dipstick: NEGATIVE
Ketones, ur: NEGATIVE mg/dL
Leukocytes,Ua: NEGATIVE
Nitrite: NEGATIVE
Protein, ur: NEGATIVE mg/dL
Specific Gravity, Urine: 1.03 (ref 1.005–1.030)
pH: 5 (ref 5.0–8.0)

## 2021-11-30 LAB — CBC
HCT: 35 % — ABNORMAL LOW (ref 36.0–46.0)
Hemoglobin: 11.4 g/dL — ABNORMAL LOW (ref 12.0–15.0)
MCH: 30.4 pg (ref 26.0–34.0)
MCHC: 32.6 g/dL (ref 30.0–36.0)
MCV: 93.3 fL (ref 80.0–100.0)
Platelets: 156 10*3/uL (ref 150–400)
RBC: 3.75 MIL/uL — ABNORMAL LOW (ref 3.87–5.11)
RDW: 13.4 % (ref 11.5–15.5)
WBC: 5.6 10*3/uL (ref 4.0–10.5)
nRBC: 0 % (ref 0.0–0.2)

## 2021-11-30 LAB — HEPATIC FUNCTION PANEL
ALT: 14 U/L (ref 0–44)
AST: 20 U/L (ref 15–41)
Albumin: 4.2 g/dL (ref 3.5–5.0)
Alkaline Phosphatase: 45 U/L (ref 38–126)
Bilirubin, Direct: 0.1 mg/dL (ref 0.0–0.2)
Indirect Bilirubin: 0.3 mg/dL (ref 0.3–0.9)
Total Bilirubin: 0.4 mg/dL (ref 0.3–1.2)
Total Protein: 7.2 g/dL (ref 6.5–8.1)

## 2021-11-30 LAB — TROPONIN I (HIGH SENSITIVITY)
Troponin I (High Sensitivity): 2 ng/L (ref <18)
Troponin I (High Sensitivity): <2 ng/L (ref <18)

## 2021-11-30 LAB — D-DIMER, QUANTITATIVE: D-Dimer, Quant: 0.68 ug/mL-FEU — ABNORMAL HIGH (ref 0.00–0.50)

## 2021-11-30 LAB — BRAIN NATRIURETIC PEPTIDE: B Natriuretic Peptide: 26.3 pg/mL (ref 0.0–100.0)

## 2021-11-30 MED ORDER — SODIUM CHLORIDE 0.9 % IV BOLUS
500.0000 mL | Freq: Once | INTRAVENOUS | Status: AC
  Administered 2021-11-30: 500 mL via INTRAVENOUS

## 2021-11-30 MED ORDER — ACETAMINOPHEN 325 MG PO TABS
650.0000 mg | ORAL_TABLET | Freq: Once | ORAL | Status: AC
  Administered 2021-11-30: 650 mg via ORAL
  Filled 2021-11-30: qty 2

## 2021-11-30 MED ORDER — FENTANYL CITRATE PF 50 MCG/ML IJ SOSY
50.0000 ug | PREFILLED_SYRINGE | Freq: Once | INTRAMUSCULAR | Status: AC
  Administered 2021-11-30: 50 ug via INTRAVENOUS
  Filled 2021-11-30: qty 1

## 2021-11-30 MED ORDER — IOHEXOL 350 MG/ML SOLN
100.0000 mL | Freq: Once | INTRAVENOUS | Status: AC | PRN
  Administered 2021-11-30: 75 mL via INTRAVENOUS

## 2021-11-30 NOTE — ED Triage Notes (Signed)
Patient presents to ED via POV from home. Here with shortness of breath and chest pain that has been ongoing "for awhile" but got severe yesterday.

## 2021-11-30 NOTE — ED Notes (Signed)
RT Note: Pt. seen for RT assessment with c/o shortness of breath, has multiple factors including OSA, Implanable Apnea device with pt. aware of L upper lobe Nodule, noted Emphysema, CXR showing coarse interstitual opacities, has Pulmonology Physician.

## 2021-11-30 NOTE — ED Notes (Signed)
Room 11 when clean

## 2021-11-30 NOTE — Telephone Encounter (Signed)
The patient has been notified of the result via her mychart. Left detailed message on voicemail informed patient to call back . Latrelle Dodrill, CMA 11/30/2021 5:02 PM

## 2021-11-30 NOTE — ED Provider Notes (Signed)
[facility] EMERGENCY DEPT Provider Note   CSN: [id number] Arrival date & time: 11/30/21  9381     History  Chief Complaint  Patient presents with   Shortness of Breath    [name] [name] is a 66 y.o. female.  She is here complaining of some right-sided chest pain that radiates into her back that is been there for a while but worse over the past few days.  She has a known pulmonary nodule on the left that they have been following by CT.  Also has a nerve stimulator in her right chest.  Feels short of breath at times.  Has had some nausea and vomiting.  Also has more chronic back pain disc disease.  Chronic tinnitus and Mnire's disease causing dizziness and keeping her from being very physically active.  She has an appointment with her doctor in a few weeks but she felt worse and so they told her to come to the emergency department for evaluation.  The history is provided by the patient and the spouse.  Shortness of Breath Severity:  Moderate Onset quality:  Gradual Duration:  4 days Timing:  Intermittent Progression:  Unchanged Chronicity:  New Context: activity   Relieved by:  Nothing Worsened by:  Activity Ineffective treatments:  Rest Associated symptoms: chest pain, cough and vomiting   Associated symptoms: no abdominal pain, no fever, no hemoptysis and no rash        Home Medications Prior to Admission medications   Medication Sig Start Date End Date Taking? Authorizing Provider  acyclovir (ZOVIRAX) 400 MG tablet Take 400 mg by mouth once as needed (breakout blisters).    [provider]  alprazolam Prudy Feeler) 2 MG tablet Take 2 mg by mouth 3 (three) times daily as needed for sleep.    [provider]  Ascorbic Acid (VITAMIN C) 1000 MG tablet Take 1,000 mg by mouth daily.    [provider]  Cholecalciferol (VITAMIN D3) 125 MCG (5000 UT) CAPS Take 5,000 Units by mouth once a week.    [provider]  FLUoxetine  (PROZAC) 40 MG capsule Take 1 capsule (40 mg total) by mouth daily. 05/01/21   Nelwyn Salisbury, MD  Magnesium 500 MG CAPS Take 500 mg by mouth daily.    [provider]  melatonin 3 MG TABS tablet Take 3 mg by mouth at bedtime.    [provider]  Multiple Vitamin (MULTIVITAMIN WITH MINERALS) TABS tablet Take 2 tablets by mouth daily.    [provider]  omeprazole (PRILOSEC) 20 MG capsule TAKE 1 CAPSULE BY MOUTH 2 TIMES DAILY BEFORE A MEAL. 10/09/21   Nelwyn Salisbury, MD  polyethylene glycol [facility] / Ethelene Hal) 17 g packet Use one packet daily 10/10/21   Nelwyn Salisbury, MD  potassium chloride (KLOR-CON) 10 MEQ tablet TAKE 1 TABLET BY MOUTH DAILY. 11/28/21   Nelwyn Salisbury, MD  Probiotic Product (PROBIOTIC PO) Take 1 capsule by mouth daily.     [provider]  rosuvastatin (CRESTOR) 40 MG tablet Take 40 mg by mouth daily.    [provider]  traMADol (ULTRAM) 50 MG tablet TAKE 1 TABLET BY MOUTH EVERY 12 HOURS AS NEEDED FOR SEVERE PAIN. 11/30/21   Nelwyn Salisbury, MD  VITAMIN E PO Take 1 tablet by mouth once a week.    [provider]      Allergies    Sulfonamide derivatives, Flexeril [cyclobenzaprine], Hydromorphone, Morphine and related, and Sulfamethoxazole  Review of Systems   Review of Systems  Constitutional:  Negative for fever.  Eyes:  Negative for visual disturbance.  Respiratory:  Positive for cough and shortness of breath. Negative for hemoptysis.   Cardiovascular:  Positive for chest pain.  Gastrointestinal:  Positive for nausea and vomiting. Negative for abdominal pain.  Genitourinary:  Positive for flank pain. Negative for dysuria.  Musculoskeletal:  Positive for back pain.  Skin:  Negative for rash.  Neurological:  Positive for dizziness.    Physical Exam Updated Vital Signs BP 137/76 (BP Location: Left Arm)   Pulse 73   Temp 97.8 F (36.6 C)   Resp 18   Ht 5\' 2"  (1.575 m)   SpO2 97%   BMI 30.54 kg/m  Physical  Exam Vitals and nursing note reviewed.  Constitutional:      General: She is not in acute distress.    Appearance: She is well-developed.  HENT:     Head: Normocephalic and atraumatic.  Eyes:     Conjunctiva/sclera: Conjunctivae normal.  Cardiovascular:     Rate and Rhythm: Normal rate and regular rhythm.     Heart sounds: No murmur heard. Pulmonary:     Effort: Pulmonary effort is normal. No respiratory distress.     Breath sounds: Normal breath sounds.  Abdominal:     Palpations: Abdomen is soft.     Tenderness: There is no abdominal tenderness.  Musculoskeletal:        General: No swelling. Normal range of motion.     Cervical back: Neck supple.     Right lower leg: No tenderness.     Left lower leg: No tenderness.  Skin:    General: Skin is warm and dry.     Capillary Refill: Capillary refill takes less than 2 seconds.  Neurological:     General: No focal deficit present.     Mental Status: She is alert.     ED Results / Procedures / Treatments   Labs (all labs ordered are listed, but only abnormal results are displayed) Labs Reviewed  CBC - Abnormal; Notable for the following components:      Result Value   RBC 3.75 (*)    Hemoglobin 11.4 (*)    HCT 35.0 (*)    All other components within normal limits  D-DIMER, QUANTITATIVE - Abnormal; Notable for the following components:   D-Dimer, Quant 0.68 (*)    All other components within normal limits  BASIC METABOLIC PANEL  URINALYSIS, ROUTINE W REFLEX MICROSCOPIC  HEPATIC FUNCTION PANEL  BRAIN NATRIURETIC PEPTIDE  TROPONIN I (HIGH SENSITIVITY)  TROPONIN I (HIGH SENSITIVITY)    EKG EKG Interpretation  Date/Time:  Thursday November 30 2021 09:38:43 EDT Ventricular Rate:  74 PR Interval:  144 QRS Duration: 84 QT Interval:  404 QTC Calculation: 448 R Axis:   74 Text Interpretation: Normal sinus rhythm Normal ECG new t wave inversion aVL compared with prior 10/19 Confirmed by 11/19 [phone]) on 11/30/2021  10:08:01 AM  Radiology CT Angio Chest PE W/Cm &/Or Wo Cm  Result Date: 11/30/2021 CLINICAL DATA:  Shortness of breath EXAM: CT ANGIOGRAPHY CHEST WITH CONTRAST TECHNIQUE: Multidetector CT imaging of the chest was performed using the standard protocol during bolus administration of intravenous contrast. Multiplanar CT image reconstructions and MIPs were obtained to evaluate the vascular anatomy. RADIATION DOSE REDUCTION: This exam was performed according to the departmental dose-optimization program which includes automated exposure control, adjustment of the mA and/or kV according to patient size and/or  use of iterative reconstruction technique. CONTRAST:  17mL OMNIPAQUE IOHEXOL 350 MG/ML SOLN COMPARISON:  Chest CT dated October 04, 2021 FINDINGS: Cardiovascular: No evidence of pulmonary embolus. Normal heart size. No pericardial effusion. Severe three-vessel coronary artery calcifications. Atherosclerotic disease of the thoracic aorta. Mediastinum/Nodes: Esophagus and thyroid are unremarkable. Mildly enlarged mediastinal lymph nodes, increased in size when compared with prior and. Reference right lower paratracheal lymph node measuring 1.3 cm in short axis on series 4, image 44, previously measured 1.0 cm. Lungs/Pleura: Central airways are patent. Centrilobular and paraseptal emphysema. No consolidation, pleural effusion or pneumothorax. Patchy lower lung and peripheral predominant reticular opacities, similar to prior CT. Nodular linear consolidation of the left upper lobe measuring 13 mm in mean diameter on series 5, image 75, unchanged in size when compared with prior exam. Upper Abdomen: No acute abnormality. Musculoskeletal: Neurostimulator device noted in the soft tissues of the right upper chest. No aggressive appearing osseous lesions. Review of the MIP images confirms the above findings. IMPRESSION: 1.   No evidence of pulmonary embolus. 2. Left upper lobe subpleural pulmonary nodule measuring 13 mm is  unchanged in size when compared with prior CTs. Recommend additional six-month follow-up CT (dated from prior October 04, 2021 exam) to ensure stability. 3. Patchy lower lung and peripheral predominant reticular opacities, similar to priors, and concerning for underlying interstitial lung disease. Consider dedicated ILD protocol CT for further evaluation after resolution of acute symptoms. 4. Aortic Atherosclerosis (ICD10-I70.0) and Emphysema (ICD10-J43.9). Electronically Signed   By: Allegra Lai M.D.   On: 11/30/2021 12:11   DG Chest 2 View  Result Date: 11/30/2021 CLINICAL DATA:  Chest pain EXAM: CHEST - 2 VIEW COMPARISON:  Chest x-ray dated May 17, 2021; chest CT dated October 04, 2021 FINDINGS: Cardiac and mediastinal contours are within normal limits. Left upper lobe pulmonary nodule, see prior CT for follow-up recommendations. Mild background course interstitial opacities, likely due to emphysema and scarring. No focal consolidation. No pleural effusion or pneumothorax. IMPRESSION: 1. No acute cardiopulmonary disease. 2. Left upper lobe pulmonary nodule, see prior October 04, 2021 chest CT for further evaluation and follow-up recommendations. Electronically Signed   By: Allegra Lai M.D.   On: 11/30/2021 10:02    Procedures Procedures    Medications Ordered in ED Medications  fentaNYL (SUBLIMAZE) injection 50 mcg (50 mcg Intravenous Given 11/30/21 1054)  sodium chloride 0.9 % bolus 500 mL (0 mLs Intravenous Stopped 11/30/21 1237)  iohexol (OMNIPAQUE) 350 MG/ML injection 100 mL (75 mLs Intravenous Contrast Given 11/30/21 1140)  acetaminophen (TYLENOL) tablet 650 mg (650 mg Oral Given 11/30/21 1312)    ED Course/ Medical Decision Making/ A&P Clinical Course as of 11/30/21 1751  Thu Nov 30, 2021  1005 Chest x-ray interpreted by me as ?pacemaker vs stimulator no acute infiltrates.  Radiology does comment upon a left upper lobe chest nodule that has been seen prior needs follow-up. [MB]   1425 Reviewed results of work-up with patient.  She says she has an appointment with her primary care doctor tomorrow.  She will likely need a referral to pulmonology regarding her lung findings.  No clear answer for what her right-sided chest pain is but no significant findings identified.  She is comfortable plan for outpatient follow-up.  Return instructions discussed [MB]    Clinical Course User Index [MB] Terrilee Files, MD  Medical Decision Making Amount and/or Complexity of Data Reviewed Labs: ordered. Radiology: ordered.  Risk OTC drugs. Prescription drug management.  This patient complains of right-sided chest pain cough shortness of breath back pain; this involves an extensive number of treatment Options and is a complaint that carries with it a high risk of complications and morbidity. The differential includes pneumonia, CHF, pneumothorax, mass,, vascular  I ordered, reviewed and interpreted labs, which included CBC with normal white count, hemoglobin slightly lower than priors, chemistries normal, urinalysis negative, troponin and BNP normal, D-dimer elevated LFTs normal I ordered medication IV fluids and pain medication and reviewed PMP when indicated. I ordered imaging studies which included chest x-ray and CT angio chest and I independently    visualized and interpreted imaging which showed stable findings, possible interstitial lung disease Additional history obtained from patient significant other Previous records obtained and reviewed in epic including prior PCP visits Cardiac monitoring reviewed, sinus rhythm Social determinants considered, patient with significant stressors Critical Interventions: None  After the interventions stated above, I reevaluated the patient and found patient symptoms to be improved oxygenating well on room air Admission and further testing considered, recommended she keep her follow-up appointment with her PCP.   May need referral to pulmonology.  Return instructions discussed          Final Clinical Impression(s) / ED Diagnoses Final diagnoses:  Right-sided chest pain  Shortness of breath    Rx / DC Orders ED Discharge Orders     None         Terrilee Files, MD 11/30/21 1753

## 2021-11-30 NOTE — Telephone Encounter (Signed)
-----   Message from Gaynelle Cage, [place] sent at 11/27/2021 10:02 AM EDT -----  ----- Message ----- From: [name], MD Sent: 11/24/2021   3:52 PM EDT To: Cv Div Sleep Studies  OSA adequately treated on [employer] settings.  Please let [employer] rep know that sleep study has been completed and looks good

## 2021-11-30 NOTE — ED Notes (Signed)
Patient transported to CT 

## 2021-11-30 NOTE — Discharge Instructions (Addendum)
You were seen in the emergency department for some right-sided chest pain and shortness of breath.  You had blood work EKG chest x-ray and a CAT scan of your chest that did not show an obvious explanation for your symptoms.  You again had a pulmonary nodule that will need to be followed.  You also had some other findings on your CAT scan that potentially may require referral to a lung specialist.  Please follow-up with your primary care doctor as scheduled.  Included below is the report of your CAT scan.  IMPRESSION:  1.   No evidence of pulmonary embolus.  2. Left upper lobe subpleural pulmonary nodule measuring 13 mm is  unchanged in size when compared with prior CTs. Recommend additional  six-month follow-up CT (dated from prior October 04, 2021 exam) to  ensure stability.  3. Patchy lower lung and peripheral predominant reticular opacities,  similar to priors, and concerning for underlying interstitial lung  disease. Consider dedicated ILD protocol CT for further evaluation  after resolution of acute symptoms.  4. Aortic Atherosclerosis (ICD10-I70.0) and Emphysema (ICD10-J43.9).

## 2021-12-05 ENCOUNTER — Other Ambulatory Visit: Payer: Self-pay

## 2021-12-05 ENCOUNTER — Emergency Department (HOSPITAL_COMMUNITY): Payer: PPO

## 2021-12-05 ENCOUNTER — Encounter (HOSPITAL_COMMUNITY): Payer: Self-pay

## 2021-12-05 ENCOUNTER — Emergency Department (HOSPITAL_COMMUNITY)
Admission: EM | Admit: 2021-12-05 | Discharge: 2021-12-05 | Disposition: A | Discharge status: Home / Self Care | Payer: PPO | Attending: Emergency Medicine | Admitting: Emergency Medicine

## 2021-12-05 DIAGNOSIS — K922 Gastrointestinal hemorrhage, unspecified: Secondary | ICD-10-CM | POA: Diagnosis not present

## 2021-12-05 DIAGNOSIS — K644 Residual hemorrhoidal skin tags: Secondary | ICD-10-CM | POA: Insufficient documentation

## 2021-12-05 DIAGNOSIS — K6289 Other specified diseases of anus and rectum: Secondary | ICD-10-CM | POA: Diagnosis present

## 2021-12-05 LAB — BASIC METABOLIC PANEL
Anion gap: 7 (ref 5–15)
BUN: 13 mg/dL (ref 8–23)
CO2: 32 mmol/L (ref 22–32)
Calcium: 10.2 mg/dL (ref 8.9–10.3)
Chloride: 102 mmol/L (ref 98–111)
Creatinine, Ser: 0.72 mg/dL (ref 0.44–1.00)
GFR, Estimated: >60 mL/min (ref >60)
Glucose, Bld: 114 mg/dL — ABNORMAL HIGH (ref 70–99)
Potassium: 4 mmol/L (ref 3.5–5.1)
Sodium: 141 mmol/L (ref 135–145)

## 2021-12-05 LAB — POC OCCULT BLOOD, ED: Fecal Occult Bld: NEGATIVE

## 2021-12-05 LAB — CBC WITH DIFFERENTIAL/PLATELET
Abs Immature Granulocytes: 0.02 10*3/uL (ref 0.00–0.07)
Basophils Absolute: 0 10*3/uL (ref 0.0–0.1)
Basophils Relative: 1 %
Eosinophils Absolute: 0.1 10*3/uL (ref 0.0–0.5)
Eosinophils Relative: 2 %
HCT: 38.8 % (ref 36.0–46.0)
Hemoglobin: 12.6 g/dL (ref 12.0–15.0)
Immature Granulocytes: 0 %
Lymphocytes Relative: 31 %
Lymphs Abs: 2.1 10*3/uL (ref 0.7–4.0)
MCH: 31 pg (ref 26.0–34.0)
MCHC: 32.5 g/dL (ref 30.0–36.0)
MCV: 95.3 fL (ref 80.0–100.0)
Monocytes Absolute: 0.5 10*3/uL (ref 0.1–1.0)
Monocytes Relative: 8 %
Neutro Abs: 3.8 10*3/uL (ref 1.7–7.7)
Neutrophils Relative %: 58 %
Platelets: 183 10*3/uL (ref 150–400)
RBC: 4.07 MIL/uL (ref 3.87–5.11)
RDW: 13.6 % (ref 11.5–15.5)
WBC: 6.6 10*3/uL (ref 4.0–10.5)
nRBC: 0 % (ref 0.0–0.2)

## 2021-12-05 MED ORDER — IOHEXOL 300 MG/ML  SOLN
100.0000 mL | Freq: Once | INTRAMUSCULAR | Status: AC | PRN
  Administered 2021-12-05: 100 mL via INTRAVENOUS

## 2021-12-05 MED ORDER — HYDROCORTISONE (PERIANAL) 2.5 % EX CREA: 1.0000 | TOPICAL_CREAM | Freq: Two times a day (BID) | CUTANEOUS | 0 refills | Status: DC

## 2021-12-05 NOTE — Discharge Instructions (Signed)
Your blood work is stable today.  Follow-up with your gastroenterologist and surgeon regarding her hemorrhoids.  Return to the ED with new or worsening symptoms.

## 2021-12-05 NOTE — ED Notes (Signed)
I provided reinforced discharge education based off of discharge instructions. Pt acknowledged and understood my education. Pt had no further questions/concerns for provider/myself.  °

## 2021-12-05 NOTE — ED Triage Notes (Signed)
Pt reports hemorrhoids x1 week. Pt reports hx of internal hemorrhoid, but reports this is worse and has bleeding.

## 2021-12-05 NOTE — ED Provider Notes (Signed)
[facility]-EMERGENCY DEPT Provider Note   CSN: [id number] Arrival date & time: 12/05/21  1617     History  Chief Complaint  Patient presents with   Hemorrhoids    [name] [name] is a 66 y.o. female.  Patient with a history of bipolar disorder, back pain, lung nodule, positive PPD here with rectal pain for the past 4 days.  She reports history of hemorrhoids but never this severe.  States she has pain with every bowel movement and red blood with wiping.  Denies straining.  Stool has been loose not black or bloody.  Has pain with defecation and blood blood with wiping.  Denies any fever or vomiting.  No blood thinner use.  No chest pain or shortness of breath.  She was referred to the ED by her gastroenterologist.  She reports she has had hemorrhoids in the past but never this severe.  Is been using over-the-counter creams without relief.  The history is provided by the patient.       Home Medications Prior to Admission medications   Medication Sig Start Date End Date Taking? Authorizing Provider  acyclovir (ZOVIRAX) 400 MG tablet Take 400 mg by mouth 4 (four) times daily.    [provider]  alprazolam Prudy Feeler) 2 MG tablet Take 2 mg by mouth 3 (three) times daily as needed for sleep.    [provider]  Ascorbic Acid (VITAMIN C) 1000 MG tablet Take 500 mg by mouth daily.    [provider]  B Complex-C-Folic Acid (B-COMPLEX/VITAMIN C PO) Take 1 tablet by mouth daily.    [provider]  Cholecalciferol (VITAMIN D3) 125 MCG (5000 UT) CAPS Take 5,000 Units by mouth once a week.    [provider]  docusate sodium (COLACE) 100 MG capsule Take 100 mg by mouth 2 (two) times daily.    [provider]  FLUoxetine (PROZAC) 40 MG capsule Take 1 capsule (40 mg total) by mouth daily. 05/01/21   Nelwyn Salisbury, MD  Magnesium 400 MG TABS Take 1 tablet by mouth daily.    [provider]  Magnesium 500 MG CAPS  Take 500 mg by mouth daily.    [provider]  Melatonin 10 MG TABS Take 10 mg by mouth at bedtime.    [provider]  omeprazole (PRILOSEC) 20 MG capsule TAKE 1 CAPSULE BY MOUTH 2 TIMES DAILY BEFORE A MEAL. 10/09/21   Nelwyn Salisbury, MD  polyethylene glycol [facility] / Ethelene Hal) 17 g packet Use one packet daily 10/10/21   Nelwyn Salisbury, MD  potassium chloride (KLOR-CON) 10 MEQ tablet TAKE 1 TABLET BY MOUTH DAILY. 11/28/21   Nelwyn Salisbury, MD  rosuvastatin (CRESTOR) 40 MG tablet Take 40 mg by mouth daily.    [provider]  traMADol (ULTRAM) 50 MG tablet TAKE 1 TABLET BY MOUTH EVERY 12 HOURS AS NEEDED FOR SEVERE PAIN. 11/30/21   Nelwyn Salisbury, MD      Allergies    Sulfonamide derivatives, Flexeril [cyclobenzaprine], Hydromorphone, Morphine and related, and Sulfamethoxazole    Review of Systems   Review of Systems  Constitutional:  Negative for activity change, appetite change and fever.  HENT:  Negative for congestion and rhinorrhea.   Respiratory:  Negative for cough, chest tightness and shortness of breath.   Cardiovascular:  Negative for chest pain.  Gastrointestinal:  Positive for blood in stool and rectal pain. Negative for abdominal pain, nausea and vomiting.  Genitourinary:  Negative  for dysuria and hematuria.  Musculoskeletal:  Negative for arthralgias and myalgias.  Skin:  Negative for rash.  Neurological:  Negative for dizziness, weakness and headaches.   all other systems are negative except as noted in the HPI and PMH.    Physical Exam Updated Vital Signs BP 124/79 (BP Location: Left Arm)   Pulse 68   Temp 98.4 F (36.9 C) (Oral)   Resp 18   Ht 5\' 2"  (1.575 m)   Wt 81.6 kg   SpO2 95%   BMI 32.92 kg/m  Physical Exam Vitals and nursing note reviewed.  Constitutional:      General: She is not in acute distress.    Appearance: She is well-developed.  HENT:     Head: Normocephalic and atraumatic.     Mouth/Throat:     Pharynx: No  oropharyngeal exudate.  Eyes:     Conjunctiva/sclera: Conjunctivae normal.     Pupils: Pupils are equal, round, and reactive to light.  Neck:     Comments: No meningismus. Cardiovascular:     Rate and Rhythm: Normal rate and regular rhythm.     Heart sounds: Normal heart sounds. No murmur heard. Pulmonary:     Effort: Pulmonary effort is normal. No respiratory distress.     Breath sounds: Normal breath sounds.  Abdominal:     Palpations: Abdomen is soft.     Tenderness: There is no abdominal tenderness. There is no guarding or rebound.  Genitourinary:    Comments: Chaperone present Architect.  Patient with multiple small external hemorrhoids that are nonthrombosed and nonbleeding.  No surrounding erythema or fluctuance. No abscess felt on digital exam.  No gross blood Musculoskeletal:        General: No tenderness. Normal range of motion.     Cervical back: Normal range of motion and neck supple.  Skin:    General: Skin is warm.  Neurological:     Mental Status: She is alert and oriented to person, place, and time.     Cranial Nerves: No cranial nerve deficit.     Motor: No abnormal muscle tone.     Coordination: Coordination normal.     Comments:  5/5 strength throughout. CN 2-12 intact.Equal grip strength.   Psychiatric:        Behavior: Behavior normal.     ED Results / Procedures / Treatments   Labs (all labs ordered are listed, but only abnormal results are displayed) Labs Reviewed  BASIC METABOLIC PANEL - Abnormal; Notable for the following components:      Result Value   Glucose, Bld 114 (*)    All other components within normal limits  CBC WITH DIFFERENTIAL/PLATELET  URINALYSIS, ROUTINE W REFLEX MICROSCOPIC  POC OCCULT BLOOD, ED    EKG None  Radiology CT ABDOMEN PELVIS W CONTRAST  Result Date: 12/05/2021 CLINICAL DATA:  Lower GI bleed.  History of hemorrhoids. EXAM: CT ABDOMEN AND PELVIS WITH CONTRAST TECHNIQUE: Multidetector CT imaging of the abdomen and  pelvis was performed using the standard protocol following bolus administration of intravenous contrast. RADIATION DOSE REDUCTION: This exam was performed according to the departmental dose-optimization program which includes automated exposure control, adjustment of the mA and/or kV according to patient size and/or use of iterative reconstruction technique. CONTRAST:  OMNIPAQUE IOHEXOL 300 MG/ML  SOLN COMPARISON:  CT abdomen pelvis dated 06/26/2021. FINDINGS: Lower chest: Bibasilar dependent atelectasis. The visualized lung bases are clear. Coronary vascular calcification. No intra-abdominal free air or free fluid. Hepatobiliary: No focal  liver abnormality is seen. No gallstones, gallbladder wall thickening, or biliary dilatation. Pancreas: Unremarkable. No pancreatic ductal dilatation or surrounding inflammatory changes. Spleen: Normal in size without focal abnormality. Adrenals/Urinary Tract: The adrenal glands are unremarkable. The kidneys, visualized ureters, and urinary bladder appear unremarkable. Stomach/Bowel: There is no bowel obstruction or active inflammation. The appendix is normal. Vascular/Lymphatic: Advanced aortoiliac atherosclerotic disease. The IVC is unremarkable. No portal venous gas. There is no adenopathy. Reproductive: Hysterectomy.  No adnexal masses. Other: None Musculoskeletal: Osteopenia with degenerative changes of the spine. Mild bilateral femoral head avascular necrosis. No cortical collapse. No acute osseous pathology. IMPRESSION: 1. No acute intra-abdominal or pelvic pathology. No bowel obstruction. Normal appendix. 2. Aortic Atherosclerosis (ICD10-I70.0). Electronically Signed   By: Elgie Collard M.D.   On: 12/05/2021 22:41    Procedures Procedures    Medications Ordered in ED Medications - No data to display  ED Course/ Medical Decision Making/ A&P                           Medical Decision Making Amount and/or Complexity of Data Reviewed Labs: ordered.  Decision-making details documented in ED Course. Radiology: ordered and independent interpretation performed. Decision-making details documented in ED Course. ECG/medicine tests: ordered and independent interpretation performed. Decision-making details documented in ED Course.  Risk Prescription drug management.  Rectal pain from hemorrhoids.  Vital stable, no distress, no blood thinner use.  Exam shows multiple external hemorrhoids without evidence of thrombosis or recent bleeding.  Colonoscopy in 2022 was normal.   Hemoglobin is stable.  Hemoccult today is negative.  Low suspicion for significant acute GI bleed. Vitals Stable.  Hemoglobin is stable.  Hemoccult today is negative.  No evidence of recent bleeding  Patient's significant other at bedside states he is already spoken with [facility] gastroenterology to arrange for hemorrhoid banding since Dr. Loreta Ave does not perform this.  We will also give referral to general surgery.  No evidence of thrombosed hemorrhoid currently.  No evidence of perirectal abscess.  We will treat supportively and follow-up with specialist. Return precautions discussed        Final Clinical Impression(s) / ED Diagnoses Final diagnoses:  External hemorrhoids    Rx / DC Orders ED Discharge Orders     None         Loucile Posner, Jeannett Senior, MD 12/06/21 0017

## 2021-12-07 ENCOUNTER — Encounter: Payer: Self-pay | Admitting: Family Medicine

## 2021-12-07 ENCOUNTER — Ambulatory Visit: Payer: PPO

## 2021-12-07 ENCOUNTER — Ambulatory Visit (INDEPENDENT_AMBULATORY_CARE_PROVIDER_SITE_OTHER): Payer: PPO | Admitting: Family Medicine

## 2021-12-07 VITALS — BP 130/80 | HR 71 | Temp 98.5°F | Wt 174.4 lb

## 2021-12-07 DIAGNOSIS — K5904 Chronic idiopathic constipation: Secondary | ICD-10-CM

## 2021-12-07 DIAGNOSIS — H8103 Meniere's disease, bilateral: Secondary | ICD-10-CM

## 2021-12-07 DIAGNOSIS — G4733 Obstructive sleep apnea (adult) (pediatric): Secondary | ICD-10-CM

## 2021-12-07 DIAGNOSIS — J849 Interstitial pulmonary disease, unspecified: Secondary | ICD-10-CM | POA: Diagnosis not present

## 2021-12-07 DIAGNOSIS — E785 Hyperlipidemia, unspecified: Secondary | ICD-10-CM | POA: Diagnosis not present

## 2021-12-07 DIAGNOSIS — K644 Residual hemorrhoidal skin tags: Secondary | ICD-10-CM | POA: Diagnosis not present

## 2021-12-07 LAB — LIPID PANEL
Cholesterol: 224 mg/dL — ABNORMAL HIGH (ref 0–200)
HDL: 64.4 mg/dL (ref >39.00)
NonHDL: 159.76
Total CHOL/HDL Ratio: 3
Triglycerides: 204 mg/dL — ABNORMAL HIGH (ref 0.0–149.0)
VLDL: 40.8 mg/dL — ABNORMAL HIGH (ref 0.0–40.0)

## 2021-12-07 LAB — LDL CHOLESTEROL, DIRECT: Direct LDL: 129 mg/dL

## 2021-12-07 LAB — HEPATIC FUNCTION PANEL
ALT: 19 U/L (ref 0–35)
AST: 27 U/L (ref 0–37)
Albumin: 4.7 g/dL (ref 3.5–5.2)
Alkaline Phosphatase: 53 U/L (ref 39–117)
Bilirubin, Direct: 0.1 mg/dL (ref 0.0–0.3)
Total Bilirubin: 0.3 mg/dL (ref 0.2–1.2)
Total Protein: 8.2 g/dL (ref 6.0–8.3)

## 2021-12-07 NOTE — Progress Notes (Signed)
   Subjective:    Patient ID: [name] [name], female    DOB: 14-Jan-1956, 66 y.o.   MRN: [id number]  HPI Here to follow up an ED visit on 12-05-20 and for other issues. She has a long hx of external hemorrhoids, and they have often bled due to constipation. She is prone to constipation because of her chronic narcotic use. That day she was bleeding excessively so she went to the ED. On exam the bleeding had stopped and the worst was over. She was given Anusol HC cream to apply as needed. Her Hgb that day was normal at 12.6. she had a normal CT of the abdomen and pelvis. Prior to this she had been using Miralax once daily, but since then she has increased this to BID. Her stool shave been soft the past few days. Otherwise she asks for a referral to Pulmonology for the interstitial lung disease that was seen on an chest CTA performed on 11-30-21. She does admit to having SOB on exertion. Also she had seen Dr. Dorma Russell in the past for her Menierre's disease, but since he has retired she asks for a referral to a new ENT.    Review of Systems  Constitutional: Negative.   Respiratory:  Positive for shortness of breath.   Cardiovascular: Negative.   Gastrointestinal:  Positive for anal bleeding and constipation. Negative for abdominal distention, abdominal pain, diarrhea, nausea and vomiting.  Neurological:  Positive for dizziness.       Objective:   Physical Exam Constitutional:      Appearance: Normal appearance. She is not ill-appearing.  Cardiovascular:     Rate and Rhythm: Normal rate and regular rhythm.     Pulses: Normal pulses.     Heart sounds: Normal heart sounds.  Pulmonary:     Effort: Pulmonary effort is normal.     Breath sounds: Normal breath sounds.  Neurological:     General: No focal deficit present.     Mental Status: She is oriented to person, place, and time.     Gait: Gait normal.           Assessment & Plan:  She has had bleeding external hemorrhoids secondary to  constipation. She agreed to keep taking Miralax twice every day. We will refer her to Pulmonology for the ILD. We will refer her to ENT for the Meniere's disease. We spent a total of ( 32  ) minutes reviewing records and discussing these issues.  Gershon Crane, MD

## 2021-12-12 ENCOUNTER — Telehealth: Payer: Self-pay | Admitting: Family Medicine

## 2021-12-13 ENCOUNTER — Encounter: Payer: PPO | Admitting: Family Medicine

## 2021-12-13 NOTE — Telephone Encounter (Signed)
Spoke with patient about message concerning results.  Voiced understanding.  Patient stated that she has appointment for a physical on 12/20/21 will discuss more at appointment.   Nothing further is needed.

## 2021-12-20 ENCOUNTER — Encounter: Payer: Self-pay | Admitting: Family Medicine

## 2021-12-20 ENCOUNTER — Ambulatory Visit (INDEPENDENT_AMBULATORY_CARE_PROVIDER_SITE_OTHER): Payer: PPO | Admitting: Family Medicine

## 2021-12-20 VITALS — BP 128/80 | HR 72 | Temp 98.1°F | Ht 62.0 in | Wt 176.0 lb

## 2021-12-20 DIAGNOSIS — F1190 Opioid use, unspecified, uncomplicated: Secondary | ICD-10-CM | POA: Diagnosis not present

## 2021-12-20 DIAGNOSIS — Z0000 Encounter for general adult medical examination without abnormal findings: Secondary | ICD-10-CM | POA: Diagnosis not present

## 2021-12-20 LAB — LIPID PANEL
Cholesterol: 203 mg/dL — ABNORMAL HIGH (ref 0–200)
HDL: 58.6 mg/dL (ref >39.00)
LDL Cholesterol: 109 mg/dL — ABNORMAL HIGH (ref 0–99)
NonHDL: 144.37
Total CHOL/HDL Ratio: 3
Triglycerides: 177 mg/dL — ABNORMAL HIGH (ref 0.0–149.0)
VLDL: 35.4 mg/dL (ref 0.0–40.0)

## 2021-12-20 LAB — HEMOGLOBIN A1C: Hgb A1c MFr Bld: 5.9 % (ref 4.6–6.5)

## 2021-12-20 LAB — TSH: TSH: 3.67 u[IU]/mL (ref 0.35–5.50)

## 2021-12-20 NOTE — Progress Notes (Signed)
   Subjective:    Patient ID: [name] [name], female    DOB: [birth year], 67 y.o.   MRN: [id number]  HPI Here for a well exam. She feels great and has no concerns. She drove herself to [facility] and back last week to visit a friend, and she did quite well. The Meniere's has not bothered her for a few months. We saw her recently for bleeding hemorrhoids, and this has stopped. She had some lab work done during a recent ED visit, and we reviewed these today.    Review of Systems  Constitutional: Negative.   HENT: Negative.    Eyes: Negative.   Respiratory: Negative.    Cardiovascular: Negative.   Gastrointestinal: Negative.   Genitourinary:  Negative for decreased urine volume, difficulty urinating, dyspareunia, dysuria, enuresis, flank pain, frequency, hematuria, pelvic pain and urgency.  Musculoskeletal: Negative.   Skin: Negative.   Neurological: Negative.  Negative for headaches.  Psychiatric/Behavioral: Negative.         Objective:   Physical Exam Constitutional:      General: She is not in acute distress.    Appearance: Normal appearance. She is well-developed.  HENT:     Head: Normocephalic and atraumatic.     Right Ear: External ear normal.     Left Ear: External ear normal.     Nose: Nose normal.     Mouth/Throat:     Pharynx: No oropharyngeal exudate.  Eyes:     General: No scleral icterus.    Conjunctiva/sclera: Conjunctivae normal.     Pupils: Pupils are equal, round, and reactive to light.  Neck:     Thyroid: No thyromegaly.     Vascular: No JVD.  Cardiovascular:     Rate and Rhythm: Normal rate and regular rhythm.     Heart sounds: Normal heart sounds. No murmur heard.    No friction rub. No gallop.  Pulmonary:     Effort: Pulmonary effort is normal. No respiratory distress.     Breath sounds: Normal breath sounds. No wheezing or rales.  Chest:     Chest wall: No tenderness.  Abdominal:     General: Bowel sounds are normal. There is no distension.      Palpations: Abdomen is soft. There is no mass.     Tenderness: There is no abdominal tenderness. There is no guarding or rebound.  Musculoskeletal:        General: No tenderness. Normal range of motion.     Cervical back: Normal range of motion and neck supple.  Lymphadenopathy:     Cervical: No cervical adenopathy.  Skin:    General: Skin is warm and dry.     Findings: No erythema or rash.  Neurological:     Mental Status: She is alert and oriented to person, place, and time.     Cranial Nerves: No cranial nerve deficit.     Motor: No abnormal muscle tone.     Coordination: Coordination normal.     Deep Tendon Reflexes: Reflexes are normal and symmetric. Reflexes normal.  Psychiatric:        Mood and Affect: Mood normal.        Behavior: Behavior normal.        Thought Content: Thought content normal.        Judgment: Judgment normal.           Assessment & Plan:  Well exam. We discussed diet and exercise. Get fasting labs.  Gershon Crane, MD

## 2021-12-20 NOTE — Addendum Note (Signed)
Addended by: Marian Sorrow D on: 12/20/2021 10:18 AM   Modules accepted: Orders

## 2021-12-23 LAB — DRUG MONITOR, PANEL 1, W/CONF, URINE
Alphahydroxyalprazolam: 721 ng/mL — ABNORMAL HIGH (ref <25)
Alphahydroxymidazolam: NEGATIVE ng/mL (ref <50)
Alphahydroxytriazolam: NEGATIVE ng/mL (ref <50)
Aminoclonazepam: NEGATIVE ng/mL (ref <25)
Amphetamines: NEGATIVE ng/mL (ref <500)
Barbiturates: NEGATIVE ng/mL (ref <300)
Benzodiazepines: POSITIVE ng/mL — AB (ref <100)
Cocaine Metabolite: NEGATIVE ng/mL (ref <150)
Creatinine: 168.1 mg/dL (ref >=20.0)
Hydroxyethylflurazepam: NEGATIVE ng/mL (ref <50)
Lorazepam: NEGATIVE ng/mL (ref <50)
Marijuana Metabolite: NEGATIVE ng/mL (ref <20)
Methadone Metabolite: NEGATIVE ng/mL (ref <100)
Nordiazepam: NEGATIVE ng/mL (ref <50)
Opiates: NEGATIVE ng/mL (ref <100)
Oxazepam: NEGATIVE ng/mL (ref <50)
Oxidant: NEGATIVE ug/mL (ref <200)
Oxycodone: NEGATIVE ng/mL (ref <100)
Phencyclidine: NEGATIVE ng/mL (ref <25)
Temazepam: NEGATIVE ng/mL (ref <50)
pH: 5.8 (ref 4.5–9.0)

## 2021-12-23 LAB — DM TEMPLATE

## 2021-12-26 ENCOUNTER — Telehealth: Payer: Self-pay | Admitting: Gastroenterology

## 2021-12-26 NOTE — Telephone Encounter (Signed)
Good Morning Dr. Tomasa Rand,   We have received records from Dr. Kenna Gilbert office. Patient is wanting to transfer her care over to our office. Patient was last seen at Dr. Kenna Gilbert office in 2022. Patients colonoscopy report and pathology report are also in her records. I will be sending patients records for you to review, will you please review and advise on scheduling?  Thank you.

## 2021-12-28 NOTE — Telephone Encounter (Signed)
LVM for patient advising her on Dr. Milas Hock recommendations.

## 2022-01-18 ENCOUNTER — Encounter: Payer: Self-pay | Admitting: Pulmonary Disease

## 2022-01-18 ENCOUNTER — Ambulatory Visit: Payer: PPO | Admitting: Pulmonary Disease

## 2022-01-18 VITALS — BP 116/72 | HR 59 | Temp 97.8°F | Ht 62.0 in | Wt 175.6 lb

## 2022-01-18 DIAGNOSIS — G4733 Obstructive sleep apnea (adult) (pediatric): Secondary | ICD-10-CM

## 2022-01-18 DIAGNOSIS — R911 Solitary pulmonary nodule: Secondary | ICD-10-CM | POA: Diagnosis not present

## 2022-01-18 DIAGNOSIS — J9611 Chronic respiratory failure with hypoxia: Secondary | ICD-10-CM

## 2022-01-18 DIAGNOSIS — J849 Interstitial pulmonary disease, unspecified: Secondary | ICD-10-CM

## 2022-01-18 NOTE — Assessment & Plan Note (Signed)
Appears to date back to 05/2021 on CT scan but perhaps even as far back as 2020 on chest x-ray. Peripheral distribution is suspicious for UIP but will need to confirm on high-resolution CT chest. She does not desaturate on exertion today and this is reassuring. We will obtain basic serology including ACE level and schedule PFTs. We will schedule high-resolution CT chest to clarify ILD pattern

## 2022-01-18 NOTE — Assessment & Plan Note (Signed)
Being managed by Dr. Mayford Knife. Controlled on hypoglossal nerve stimulator implant, currently level 5 = 1.8 V

## 2022-01-18 NOTE — Patient Instructions (Addendum)
  X Amb sat  X Schedule pFTs  X Blood work today - ANA, CCP, ACE level,alpha 1  X HRCT chest in October

## 2022-01-18 NOTE — Assessment & Plan Note (Signed)
Stability over a 15-month period  Is reassuring . On review of chest x-ray this may date back further but due to her history of smoking, malignancy remains in the differential. We will serially follow this up to 2 years Unclear cause of right paratracheal lymphadenopathy which has persisted we will continue to follow this and CT

## 2022-01-18 NOTE — Progress Notes (Signed)
Subjective:    Patient ID: [name] [name], female    DOB: 04-Jun-1956, 66 y.o.   MRN: [id number]  HPI  Chief Complaint  Patient presents with   Consult    Has inspire for OSA.  Sleeps well. Breathing good. Referred b/c irregular chest CT   [name] [name] is a 66 year old ex-smoker who presents for evaluation of shortness of breath and abnormal CT suggesting ILD She is accompanied by her daughter who is visiting from [place]. She reports shortness of breath for 2 years.  This is mainly on exertion but her walking is limited more by back pain then by dyspnea.  She smoked for about 15 pack years, a pack would last for 3 days until she quit in July 2022. She reports exposure to fiberglass when her husband was working on a race car in the garage in the remote past. She developed shortness of breath and underwent chest x-ray 04/2021 which suggested left upper lobe nodule. CT chest 05/2021 showed emphysema, 13 mm left upper lobe nodule peripheral and bibasilar and peripheral reticular markings, 11 mm right paratracheal lymph node Follow-up CT chest with contrast 09/2021 -showed unchanged left upper lobe nodule and similar changes of ILD.  CT angiogram chest 11/2021 showed right paratracheal lymph node 1.3 cm, unchanged nodule and similar peripheral reticular opacities  She denies cough, skin lesions Serology in the remote past in 2008 was negative ANA, RA factor, ACE level was 65  PMH - ADHD, bipolar disorder, hypertension -hypoglossal nerve stimulator implant by Dr. Redmond Baseman on 03/25/2019.   -Currently set between 1.4 and 1.8 V ,level 5 equals 1.8 V   Significant tests/ events reviewed  04/2020 Inspire titration  07/2021 HST showed AHI of 18/hour on 2.7 V?  Overstimulation HST 11/2021 (on Inspire) mild, AHI 7/h ,low desat 86% on 1.8 V  Past Medical History:  Diagnosis Date   Allergic rhinitis 01/23/2008   Angina pectoris 06/06/2016   Arthritis    Attention deficit hyperactivity disorder (ADHD)  02/07/2016   Back pain, lumbar 01/21/2009   Bell's palsy 02/06/2008   Bipolar disorder 12/13/2009   Chronic idiopathic constipation    Dizziness 04/16/2008   Edema, leg 01/21/2009   Epigastric pain    Essential hypertension 01/23/2008   Gastro-esophageal reflux disease without esophagitis 01/23/2008   Generalized anxiety disorder 01/23/2008   Headache 04/15/2007   Hidradenitis 10/04/2008   History of colonic polyps    Hyperlipidemia    Hypokalemia 07/23/2018   Insomnia 01/23/2008   Major depressive disorder 01/23/2008   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   Neck pain 05/06/2017   Obesity (BMI 30-39.9) 11/10/2015   OSA (obstructive sleep apnea) 04/15/2007   inspire implant; does not always remember to turn device on   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/2008   Qualifier: Diagnosis of  By: Sarajane Jews MD, Ishmael Holter    PUD (peptic ulcer disease) 06/06/2016   Unspecified urinary incontinence 01/23/2008   Wears dentures    Wears glasses   .  Past Surgical History:  Procedure Laterality Date   ABDOMINAL HYSTERECTOMY     ANTERIOR FUSION CERVICAL SPINE  2006   Dr. Saintclair Halsted   BREAST BIOPSY Right 1981   benign   BREAST EXCISIONAL BIOPSY Right 1981   benign   scar does not show   BREAST SURGERY Bilateral 12/11/2012   reductions per Dr. Julious Oka in [place], BILATERAL Bilateral last summer of 2018   at [place]  03/07/2021   per Dr. Collene Mares,  adenomatous polyps, repeat in 7 yrs   DRUG INDUCED ENDOSCOPY N/A 01/30/2019   Procedure: DRUG INDUCED ENDOSCOPY;  Surgeon: Christia Reading, MD;  Location: [facility];  Service: ENT;  Laterality: N/A;   ESOPHAGOGASTRODUODENOSCOPY  04/23/2011   per Dr. Loreta Ave, normal    EYE SURGERY  06/18/2016   L eyelid    IMPLANTATION OF HYPOGLOSSAL NERVE STIMULATOR Right 03/25/2019   Procedure: IMPLANTATION OF HYPOGLOSSAL NERVE STIMULATOR;  Surgeon: Christia Reading, MD;  Location: [facility] OR;  Service: ENT;  Laterality: Right;   LUMBAR  DISC SURGERY  2003   Dr. Wynetta Emery   MULTIPLE TOOTH EXTRACTIONS     ORIF WRIST FRACTURE Right 08/29/2018   REDUCTION MAMMAPLASTY Bilateral 2013   SPLIT NIGHT STUDY  11/11/2015   UVULOPALATOPHARYNGOPLASTY     VAGINAL HYSTERECTOMY  05/1995   secondary to fibroids.    Allergies  Allergen Reactions   Sulfonamide Derivatives Swelling    Facial swelling   Flexeril [Cyclobenzaprine] Other (See Comments)    Dizziness, made meniere's disease worse    Hydromorphone Itching   Morphine And Related Nausea And Vomiting   Sulfamethoxazole Other (See Comments)    Causes swelling     Social History   Socioeconomic History   Marital status: Married    Spouse name: Not on file   Number of children: 2   Years of education: 14   Highest education level: Associate degree: academic program  Occupational History   Occupation: Disabled  Tobacco Use   Smoking status: Former    Years: 30.00    Types: Cigarettes    Quit date: 12/2020    Years since quitting: 1.0   Smokeless tobacco: Never  Vaping Use   Vaping Use: Never used  Substance and Sexual Activity   Alcohol use: Not Currently   Drug use: No   Sexual activity: Not Currently    Partners: Male    Birth control/protection: Surgical  Other Topics Concern   Not on file  Social History Narrative   Pt lives in [place], [place].   Right handed    Occasional use of caffeine   Lives with ex-husband         07/10/2018: Lives with ex-husband on one-level house. Divorced, according to husband, for financial benefit only    Has two children, one local and one in [place]. Has several grandchildren locally who can help her occasionally if needed.   Social Determinants of Health   Financial Resource Strain: Low Risk  (07/26/2021)   Overall Financial Resource Strain (CARDIA)    Difficulty of Paying Living Expenses: Not hard at all  Food Insecurity: No Food Insecurity (07/26/2021)   Hunger Vital Sign    Worried About Running Out of Food in the Last Year: Never  true    Ran Out of Food in the Last Year: Never true  Transportation Needs: No Transportation Needs (07/26/2021)   PRAPARE - Administrator, Civil Service (Medical): No    Lack of Transportation (Non-Medical): No  Physical Activity: Insufficiently Active (07/26/2021)   Exercise Vital Sign    Days of Exercise per Week: 3 days    Minutes of Exercise per Session: 10 min  Stress: Stress Concern Present (07/26/2021)   [employer] of Occupational Health - Occupational Stress Questionnaire    Feeling of Stress : Very much  Social Connections: Socially Integrated (07/26/2021)   Social Connection and Isolation Panel [NHANES]    Frequency of Communication with Friends and Family: More than three  times a week    Frequency of Social Gatherings with Friends and Family: More than three times a week    Attends Religious Services: 1 to 4 times per year    Active Member of [employer] or Organizations: Yes    Attends Banker Meetings: 1 to 4 times per year    Marital Status: Married  Catering manager Violence: Not At Risk (07/26/2021)   Humiliation, Afraid, Rape, and Kick questionnaire    Fear of Current or Ex-Partner: No    Emotionally Abused: No    Physically Abused: No    Sexually Abused: No    Family History  Problem Relation Age of Onset   Dementia Mother    Hyperlipidemia Mother    Asthma Father    Hypertension Sister    Hyperlipidemia Sister    Coronary artery disease Sister    Thyroid disease Sister    Hypertension Brother    Hyperlipidemia Brother    Coronary artery disease Brother    Hypertension Sister    Hyperlipidemia Sister    Alcohol abuse Other        fhx   Arthritis Other        fhx   Coronary artery disease Other        fhx   Depression Other        fhx   Diabetes Other        fhx   Hyperlipidemia Other        fhx   Hypertension Other        fhx   Sudden death Other        fhx     Review of Systems Constitutional: negative for anorexia,  fevers and sweats  Eyes: negative for irritation, redness and visual disturbance  Ears, nose, mouth, throat, and face: negative for earaches, epistaxis, nasal congestion and sore throat  Respiratory: negative for cough, sputum and wheezing  Cardiovascular: negative for chest pain,  lower extremity edema, orthopnea, palpitations and syncope  Gastrointestinal: negative for abdominal pain, constipation, diarrhea, melena, nausea and vomiting  Genitourinary:negative for dysuria, frequency and hematuria  Hematologic/lymphatic: negative for bleeding, easy bruising and lymphadenopathy  Musculoskeletal:negative for arthralgias, muscle weakness and stiff joints  Neurological: negative for coordination problems, gait problems, headaches and weakness  Endocrine: negative for diabetic symptoms including polydipsia, polyuria and weight loss     Objective:   Physical Exam  Gen. Pleasant, obese, in no distress, normal affect ENT - no pallor,icterus, no post nasal drip, class 2-3 airway Neck: No JVD, no thyromegaly, no carotid bruits Lungs: no use of accessory muscles, no dullness to percussion, bibasal fine dry crackles Cardiovascular: Rhythm regular, heart sounds  normal, no murmurs or gallops, no peripheral edema Abdomen: soft and non-tender, no hepatosplenomegaly, BS normal. Musculoskeletal: No deformities, no cyanosis or clubbing Neuro:  alert, non focal, no tremors       Assessment & Plan:

## 2022-01-22 LAB — ANA: Anti Nuclear Antibody (ANA): POSITIVE — AB

## 2022-01-22 LAB — ANTI-NUCLEAR AB-TITER (ANA TITER)
ANA TITER: 1:40 {titer} — ABNORMAL HIGH
ANA Titer 1: 1:40 {titer} — ABNORMAL HIGH

## 2022-01-22 LAB — CYCLIC CITRUL PEPTIDE ANTIBODY, IGG: Cyclic Citrullin Peptide Ab: <16 UNITS

## 2022-01-22 LAB — ANGIOTENSIN CONVERTING ENZYME: Angiotensin-Converting Enzyme: 40 U/L (ref 9–67)

## 2022-01-25 ENCOUNTER — Ambulatory Visit (HOSPITAL_COMMUNITY)
Admission: RE | Admit: 2022-01-25 | Discharge: 2022-01-25 | Disposition: A | Discharge status: Home / Self Care | Payer: PPO | Source: Ambulatory Visit | Attending: Pulmonary Disease | Admitting: Pulmonary Disease

## 2022-01-25 DIAGNOSIS — J84112 Idiopathic pulmonary fibrosis: Secondary | ICD-10-CM | POA: Diagnosis not present

## 2022-01-25 DIAGNOSIS — J432 Centrilobular emphysema: Secondary | ICD-10-CM | POA: Diagnosis not present

## 2022-01-25 DIAGNOSIS — J9611 Chronic respiratory failure with hypoxia: Secondary | ICD-10-CM | POA: Insufficient documentation

## 2022-01-25 DIAGNOSIS — J941 Fibrothorax: Secondary | ICD-10-CM | POA: Diagnosis not present

## 2022-01-25 DIAGNOSIS — J9610 Chronic respiratory failure, unspecified whether with hypoxia or hypercapnia: Secondary | ICD-10-CM | POA: Diagnosis not present

## 2022-01-27 LAB — ALPHA-1 ANTITRYPSIN PHENOTYPE: A-1 Antitrypsin, Ser: 140 mg/dL (ref 83–199)

## 2022-01-30 ENCOUNTER — Telehealth: Payer: Self-pay | Admitting: Pharmacist

## 2022-01-30 NOTE — Chronic Care Management (AMB) (Signed)
Chronic Care Management Pharmacy Assistant   Name: [name] [name]  MRN: [id number] DOB: [birth year]  Reason for Encounter: Disease State   Conditions to be addressed/monitored: General Assessment  Recent office visits:  12/20/21 Nelwyn Salisbury, MD - Patient presented for Preventative health care and other concerns. Stopped Docusate Sodium. Stopped Melatonin.  12/07/21 Nelwyn Salisbury, MD - Patient presented for Bleeding external hemorrhoids and other concerns. No medication changes.  Recent consult visits:  01/30/22 [name], MD - Patient presented for Chronic respiratory failure with hypoxia and other concerns. Changed Fluoxetine.  12/14/21 Sammuel Hines, MD (ENT) - Patient presented for Meniere's disease, unspecified laterality. No medication changes.  11/23/21 [name], MD (Cardiology) - Patient presented for OSA. No medication changes.  10/18/21 [name], MD (Cardiology) - Patient presented via video for OSA and other concerns. No medication changes.  Hospital visits:  Medication Reconciliation was completed by comparing discharge summary, patient's EMR and Pharmacy list, and upon discussion with patient.  Patient presented to [facility] ED on 11/21/21 due to External Hemorrhoids. Patient was present for 6 hours.  New?Medications Started at [facility] Discharge:?? -started  hydrocortisone 2.5 % rectal cream  Medication Changes at Hospital Discharge: -Changed  none  Medications Discontinued at Hospital Discharge: -Stopped  none  Medications that remain the same after Hospital Discharge:??  -All other medications will remain the same.    Medication Reconciliation was completed by comparing discharge summary, patient's EMR and Pharmacy list, and upon discussion with patient.  Patient presented to [facility] ED on 11/30/21 due to right sided  chest pain. Patient was present for 5 hours.  New?Medications Started at [facility]  Discharge:?? -started  none  Medication Changes at Hospital Discharge: -Changed  none  Medications Discontinued at Hospital Discharge: -Stopped  none  Medications that remain the same after Hospital Discharge:??  -All other medications will remain the same.      Medications: Outpatient Encounter Medications as of 01/30/2022  Medication Sig Note   acyclovir (ZOVIRAX) 400 MG tablet Take 400 mg by mouth 4 (four) times daily. 11/30/2021: prn   alprazolam (XANAX) 2 MG tablet Take 2 mg by mouth 3 (three) times daily as needed for sleep. 11/30/2021: For anxiety per pill bottle   Ascorbic Acid (VITAMIN C) 1000 MG tablet Take 500 mg by mouth daily.    B Complex-C-Folic Acid (B-COMPLEX/VITAMIN C PO) Take 1 tablet by mouth daily.    Cholecalciferol (VITAMIN D3) 125 MCG (5000 UT) CAPS Take 5,000 Units by mouth once a week.    FLUoxetine (PROZAC) 40 MG capsule Take 1 capsule (40 mg total) by mouth daily.    hydrocortisone (ANUSOL-HC) 2.5 % rectal cream Place 1 Application rectally 2 (two) times daily.    Magnesium 500 MG CAPS Take 500 mg by mouth daily. 11/30/2021: Pt takes magnesium complex 225mg    omeprazole (PRILOSEC) 20 MG capsule TAKE 1 CAPSULE BY MOUTH 2 TIMES DAILY BEFORE A MEAL. 11/30/2021: Pt only takes once daily at night   Polyethyl Glycol-Propyl Glycol (SYSTANE) 0.4-0.3 % GEL ophthalmic gel Place 1 drop into both eyes daily as needed.    polyethylene glycol (MIRALAX / GLYCOLAX) 17 g packet Use one packet daily    potassium chloride (KLOR-CON) 10 MEQ tablet TAKE 1 TABLET BY MOUTH DAILY.    rosuvastatin (CRESTOR) 40 MG tablet Take 40 mg by mouth daily.    traMADol (ULTRAM) 50 MG tablet TAKE 1 TABLET BY MOUTH EVERY 12  HOURS AS NEEDED FOR SEVERE PAIN.    No facility-administered encounter medications on file as of 01/30/2022.   Contacted [name] for General Review Call  Adherence Review:  Does the Clinical Pharmacist Assistant have access to adherence rates? Yes Adherence  rates for STAR metric medications  Rosuvastatin 40 mg - Last filled 11/28/21 90 DS at [facility] Rosuvastatin 40 mg - Last filled 08/21/21 90 DS at [facility] Does the patient have >5 day gap between last estimated fill dates for any of the above medications or other medication gaps? No  Disease State Questions:  Able to connect with Patient? Yes Did patient have any problems with their health recently? Yes Patient reports she recently had some scans done and had some not great news concerning her lungs but she considers herself fortunate and blessed and in good spirits. Have you had any admissions or emergency room visits or worsening of your condition(s) since last visit? No  Have you had any visits with new specialists or providers since your last visit? Yes Explain:Patient reports she has a new lung provider that she loves and gets along with great.  Have you had any new health care problem(s) since your last visit? No  Have you run out of any of your medications since you last spoke with clinical pharmacist? No  Are there any medications you are not taking as prescribed? No  Are you having any issues or side effects with your medications? No  Do you have any other health concerns or questions you want to discuss with your Clinical Pharmacist before your next visit? No Patient reports none she is looking forward to her call with the Pharmacist but nothing pressing at the moment. Has Daughter here from [place] with her now and enjoying her company.  Are there any health concerns that you feel we can do a better job addressing? No Note Patient's response. Are you having any problems with any of the following since the last visit: (select all that apply)  None   12. Any falls since last visit? No  13. Any increased or uncontrolled pain since last visit? No  14. Next visit Type: telephone       Visit with: MP 02/16/22         15. Additional Details? No    Care Gaps: COVID  Booster - Overdue Flu Vaccine - Overdue Mammogram - Postponed DEXA - Postponed Zoster Vaccine - Postponed CCM- 02/16/22 BP- 116/72 01/18/22 AWV-  07/26/21  Star Rating Drugs: Rosuvastatin 40 mg - Last filled 11/28/21 90 DS at [facility]    Pamala Duffel CMA Clinical Pharmacist Assistant [phone]

## 2022-02-06 ENCOUNTER — Telehealth: Payer: Self-pay | Admitting: Pulmonary Disease

## 2022-02-06 NOTE — Telephone Encounter (Signed)
ATC with CT results, lm

## 2022-02-07 NOTE — Telephone Encounter (Signed)
[name], MD  02/01/2022  9:30 PM EDT     HRCT confirms pulmonary fibrosis Await PFTs which will tell us extent She needs FU OV with me arranged after PFTsto discuss next steps Can add on for 12N on 8/30    Attempted to call pt but unable to reach. Left message for her to return call. Due to multiple attempts trying to reach pt without being able to do so, per protocol encounter will be closed.

## 2022-02-15 ENCOUNTER — Telehealth: Payer: Self-pay | Admitting: Pharmacist

## 2022-02-15 NOTE — Chronic Care Management (AMB) (Signed)
    Chronic Care Management Pharmacy Assistant   Name: Kiira Brach  MRN: [id number] DOB: [birth year]  02/15/22 APPOINTMENT REMINDER    Patient was reminded to have all medications, supplements and any blood glucose and blood pressure readings available for review with Gaylord Shih, Pharm. D, for telephone visit on 02/16/22 at 11:30.    Care Gaps: COVID Booster - Overdue Flu Vaccine - Overdue Mammogram - Postponed DEXA - Postponed Zoster Vaccine - Postponed CCM- 02/16/22 BP- 116/72 01/18/22 AWV-  07/26/21  Star Rating Drug: Rosuvastatin 40 mg - Last filled 11/28/21 90 DS at [facility]    Medications: Outpatient Encounter Medications as of 02/15/2022  Medication Sig Note   acyclovir (ZOVIRAX) 400 MG tablet Take 400 mg by mouth 4 (four) times daily. 11/30/2021: prn   alprazolam (XANAX) 2 MG tablet Take 2 mg by mouth 3 (three) times daily as needed for sleep. 11/30/2021: For anxiety per pill bottle   Ascorbic Acid (VITAMIN C) 1000 MG tablet Take 500 mg by mouth daily.    B Complex-C-Folic Acid (B-COMPLEX/VITAMIN C PO) Take 1 tablet by mouth daily.    Cholecalciferol (VITAMIN D3) 125 MCG (5000 UT) CAPS Take 5,000 Units by mouth once a week.    FLUoxetine (PROZAC) 40 MG capsule Take 1 capsule (40 mg total) by mouth daily.    hydrocortisone (ANUSOL-HC) 2.5 % rectal cream Place 1 Application rectally 2 (two) times daily.    Magnesium 500 MG CAPS Take 500 mg by mouth daily. 11/30/2021: Pt takes magnesium complex 225mg    omeprazole (PRILOSEC) 20 MG capsule TAKE 1 CAPSULE BY MOUTH 2 TIMES DAILY BEFORE A MEAL. 11/30/2021: Pt only takes once daily at night   Polyethyl Glycol-Propyl Glycol (SYSTANE) 0.4-0.3 % GEL ophthalmic gel Place 1 drop into both eyes daily as needed.    polyethylene glycol (MIRALAX / GLYCOLAX) 17 g packet Use one packet daily    potassium chloride (KLOR-CON) 10 MEQ tablet TAKE 1 TABLET BY MOUTH DAILY.    rosuvastatin (CRESTOR) 40 MG tablet Take 40 mg by mouth daily.     traMADol (ULTRAM) 50 MG tablet TAKE 1 TABLET BY MOUTH EVERY 12 HOURS AS NEEDED FOR SEVERE PAIN.    No facility-administered encounter medications on file as of 02/15/2022.     02/17/2022 CMA Clinical Pharmacist Assistant [phone]

## 2022-02-16 ENCOUNTER — Ambulatory Visit (INDEPENDENT_AMBULATORY_CARE_PROVIDER_SITE_OTHER): Payer: PPO | Admitting: Pharmacist

## 2022-02-16 DIAGNOSIS — E785 Hyperlipidemia, unspecified: Secondary | ICD-10-CM

## 2022-02-16 DIAGNOSIS — I10 Essential (primary) hypertension: Secondary | ICD-10-CM

## 2022-02-16 NOTE — Patient Instructions (Signed)
Hi [name] [name],  It was great to get to catch up again!  Please go ahead and get the Colace (docusate) like we described because I think it will also help to prevent constipation in addition to the Miralax.  Please reach out to me if you have any questions or need anything before I reach back out!  Best, [name]  Gaylord Shih, PharmD, [facility] Clinical Pharmacist [facility] [place] [phone]   Visit Information   Goals Addressed   None    Patient Care Plan: CCM Pharmacy Care Plan     Problem Identified: Problem: Hypertension, Hyperlipidemia, GERD, Depression, Anxiety, Allergic Rhinitis and ADHD and back pain      Long-Range Goal: Patient-Specific Goal   Start Date: 09/21/2020  Expected End Date: 09/21/2021  Recent Progress: On track  Priority: High  Note:   Current Barriers:  Unable to independently monitor therapeutic efficacy Unable to achieve control of cholesterol  Unable to self administer medications as prescribed  Pharmacist Clinical Goal(s):  Patient will achieve adherence to monitoring guidelines and medication adherence to achieve therapeutic efficacy achieve control of cholesterol as evidenced by next lipid panel  through collaboration with PharmD and provider.   Interventions: 1:1 collaboration with Nelwyn Salisbury, MD regarding development and update of comprehensive plan of care as evidenced by provider attestation and co-signature Inter-disciplinary care team collaboration (see longitudinal plan of care) Comprehensive medication review performed; medication list updated in electronic medical record  Hypertension (BP goal <130/80) -Controlled -Current treatment: No medications  -Medications previously tried: n/a  -Current home readings: 116/72 (brand new arm cuff - usually in the yellow 120-129 or blue is < 120) -Current dietary habits: been eating more fruit and vegetables and not eating out at all -Current exercise habits: would like to start  walking and recommended trying yoga on youtube to improve balance -Denies hypotensive/hypertensive symptoms -Educated on Daily salt intake goal < 2300 mg; Exercise goal of 150 minutes per week; -Counseled to monitor BP at home as directed, document, and provide log at future appointments -Counseled on diet and exercise extensively -Recommended bring her BP cuff to next office visit to ensure accuracy.  Hyperlipidemia/CAD: (LDL goal < 70) -Uncontrolled -Current treatment: Rosuvastatin 40mg , 1 tablet once daily  - Appropriate, Query effective, Safe, Accessible -Medications previously tried: none  -Current dietary patterns: doing much better with diet; not eating out at all and eating lots of fruits. Seafood and stir fry vegetables -Current exercise habits: no consistent exercise -Educated on Cholesterol goals;  Benefits of statin for ASCVD risk reduction; Importance of limiting foods high in cholesterol; Exercise goal of 150 minutes per week; -Counseled on diet and exercise extensively Recommended addition of Zetia 10 mg daily and aspirin 81 mg daily due to CAD.  Depression/Anxiety/bipolar disorder (Goal: minimize symptoms) -Not ideally controlled -Current treatment: fluoxetine 40 mg, 1 tablet once daily - Appropriate, Effective, Safe, Accessible alprazolam 2mg , 1 tablet three times daily as needed (patient reports sometimes not taking once a day) - Appropriate, Effective, Query Safe, Accessible -Medications previously tried/failed: Seroquel, Lithium -PHQ9: 0 -GAD7: n/a -Connected with Health phone number [phone]) for mental health support -Educated on Benefits of medication for symptom control Benefits of cognitive-behavioral therapy with or without medication -Collaborated with [facility] for assistance with finding counselor.  Insomnia (Goal: improve quality and quantity of sleep) -Controlled -Current treatment  Inspire device  Hydroxyzine 50 mg  1 tablet at bedtime as needed - Appropriate, Effective, Safe, Accessible -Medications previously tried: Seroquel,  Zzquil, melatonin -Counseled on worsening of sleep apnea with use of alprazolam.  Allergic rhinitis (Goal: minimize symptoms) -Controlled -Current treatment  azelastine (Astelin) 0.1% nasal spray, 2 sprays into both nostrils twice daily as directed - Appropriate, Effective, Safe, Accessible -Medications previously tried: none  -Recommended to continue current medication  Arthritis pain (Goal: minimize pain) -Controlled -Current treatment  Osteo Bi-flex (glucosamine condroitin & vitamin D) 2000 units daily - Appropriate, Effective, Safe, Accessible -Medications previously tried: none  -Counseled on giving the medicine a few weeks trial before deciding if it is helping; discussed stopping vitamin D supplementation with this as the replacement  GERD (Goal: minimize symptoms) -Controlled -Current treatment  Omeprazole 20 mg 1 capsule twice daily (once daily) - Appropriate, Effective, Safe, Accessible -Medications previously tried: none  -Recommended to continue current medication  Low potassium (Goal: 3.5-5) -Controlled -Current treatment  Potassium chloride 10 mEq 1 tablet daily - Appropriate, Effective, Safe, Accessible -Medications previously tried: none  -Recommended repeat potassium level  Low back pain (Goal: minimize pain) -Not ideally controlled -Current treatment  Tramadol 50 mg 1 tablet every 12 hours as needed - Appropriate, Effective, Query Safe, Accessible -Medications previously tried: hydrocodone -Counseled on separating from other sedating medications  ADHD (Goal: remain focused) -Controlled -Current treatment  No medications -Medications previously tried: Adderall (no longer needed) -Recommended to continue current medication   Health Maintenance -Vaccine gaps: second dose of shingrix, COVID vaccine -Current therapy:  Vitamin E 1 tablet once  a week Magnesium 500 mg daily Vitamin B complex 1 capsule daily Multivitamin 1 tablet daily Acyclovir 400 mg 1 tablet four times daily - patient only takes once daily -Educated on Cost vs benefit of each product must be carefully weighed by individual consumer -Patient is satisfied with current therapy and denies issues -Recommended to continue current medication  Patient Goals/Self-Care Activities Patient will:  - take medications as prescribed focus on medication adherence by filling out weekly pill box and marking calendar for doses taken target a minimum of 150 minutes of moderate intensity exercise weekly  Follow Up Plan: The care management team will reach out to the patient again over the next 14 days.         Patient verbalizes understanding of instructions and care plan provided today and agrees to view in MyChart. Active MyChart status and patient understanding of how to access instructions and care plan via MyChart confirmed with patient.    The pharmacy team will reach out to the patient again over the next 14 days.   Verner Chol, [facility]

## 2022-02-16 NOTE — Progress Notes (Signed)
Chronic Care Management Pharmacy Note  02/16/2022 Name:  [name] [name] MRN:  [id number] DOB:  [birth year]  Summary: LDL not at goal < 70 BP is at goal < 130/80 per home readings Pt reports she is having a tough time lately   Recommendations/Changes made from today's visit: -Recommended addition of Zetia 10 mg daily for LDL lowering -Recommend aspirin 81 mg daily due to CAD finding -Recommend CCM referral for social worker   Plan: Tolerance assessment in 2 weeks  Repeat lipid panel in 2 months Follow up in 4 months  Subjective: [name] [name] is an 66 y.o. year old female who is a primary patient of Laurey Morale, MD.  The CCM team was consulted for assistance with disease management and care coordination needs.    Engaged with patient by telephone for follow up visit in response to provider referral for pharmacy case management and/or care coordination services.   Consent to Services:  The patient was given information about Chronic Care Management services, agreed to services, and gave verbal consent prior to initiation of services.  Please see initial visit note for detailed documentation.   Patient Care Team: Laurey Morale, MD as PCP - General Sueanne Margarita, MD as Consulting Physician (Cardiology) Viona Gilmore, [facility] as Pharmacist (Pharmacist)  Recent office visits: 12/20/21 Laurey Morale, MD - Patient presented for Preventative health care and other concerns. Stopped Docusate Sodium. Stopped Melatonin.   12/07/21 Laurey Morale, MD - Patient presented for Bleeding external hemorrhoids and other concerns. No medication changes.  Recent consult visits: 01/30/22 Rigoberto Noel, MD - Patient presented for Chronic respiratory failure with hypoxia and other concerns. Changed Fluoxetine.   12/14/21 Hessie Knows, MD (ENT) - Patient presented for Meniere's disease, unspecified laterality. No medication changes.   11/23/21 Sueanne Margarita, MD (Cardiology) -  Patient presented for OSA. No medication changes.   10/18/21 Sueanne Margarita, MD (Cardiology) - Patient presented via video for OSA and other concerns. No medication changes.  09/05/21 Fransico Him, MD (cardiology): Patient presented for OSA follow up. Adjusted setting on inspire device.  08/16/21 Metta Clines, DO (neurology): Patient presented for chronic dizziness and memory deficits. Recommended PRN follow up.   Hospital visits: Medication Reconciliation was completed by comparing discharge summary, patient's EMR and Pharmacy list, and upon discussion with patient.   Patient presented to [facility] ED on 11/21/21 due to External Hemorrhoids. Patient was present for 6 hours.   New?Medications Started at [facility] Discharge:?? -started  hydrocortisone 2.5 % rectal cream   Medication Changes at Hospital Discharge: -Changed  none   Medications Discontinued at Hospital Discharge: -Stopped  none   Medications that remain the same after Hospital Discharge:??  -All other medications will remain the same.     Medication Reconciliation was completed by comparing discharge summary, patient's EMR and Pharmacy list, and upon discussion with patient.   Patient presented to [facility] on 11/30/21 due to right sided  chest pain. Patient was present for 5 hours.   New?Medications Started at [facility] Discharge:?? -started  none   Medication Changes at Hospital Discharge: -Changed  none   Medications Discontinued at Hospital Discharge: -Stopped  none   Medications that remain the same after Hospital Discharge:??  -All other medications will remain the same.      Objective:  Lab Results  Component Value Date   CREATININE 0.72 12/05/2021   BUN 13 12/05/2021   GFR 90.38  06/22/2021   GFRNONAA >60 12/05/2021   GFRAA >60 03/23/2019   NA 141 12/05/2021   K 4.0 12/05/2021   CALCIUM 10.2 12/05/2021   CO2 32 12/05/2021   GLUCOSE 114 (H) 12/05/2021    Lab Results   Component Value Date/Time   HGBA1C 5.9 12/20/2021 10:12 AM   HGBA1C 6.1 05/01/2021 10:15 AM   GFR 90.38 06/22/2021 10:47 AM   GFR 83.28 05/01/2021 10:15 AM    Last diabetic Eye exam: No results found for: "HMDIABEYEEXA"  Last diabetic Foot exam: No results found for: "HMDIABFOOTEX"   Lab Results  Component Value Date   CHOL 203 (H) 12/20/2021   HDL 58.60 12/20/2021   LDLCALC 109 (H) 12/20/2021   LDLDIRECT 129.0 12/07/2021   TRIG 177.0 (H) 12/20/2021   CHOLHDL 3 12/20/2021       Latest Ref Rng & Units 12/07/2021   11:14 AM 11/30/2021   10:22 AM 05/01/2021   10:15 AM  Hepatic Function  Total Protein 6.0 - 8.3 g/dL 8.2  7.2  8.0   Albumin 3.5 - 5.2 g/dL 4.7  4.2  4.7   AST 0 - 37 U/L $Remo'27  20  26   'yXawY$ ALT 0 - 35 U/L $Remo'19  14  21   'WGhzm$ Alk Phosphatase 39 - 117 U/L 53  45  63   Total Bilirubin 0.2 - 1.2 mg/dL 0.3  0.4  0.3   Bilirubin, Direct 0.0 - 0.3 mg/dL 0.1  0.1  0.0     Lab Results  Component Value Date/Time   TSH 3.67 12/20/2021 10:12 AM   TSH 2.46 05/01/2021 10:15 AM       Latest Ref Rng & Units 12/05/2021    9:24 PM 11/30/2021   10:16 AM 05/01/2021   10:15 AM  CBC  WBC 4.0 - 10.5 K/uL 6.6  5.6  6.7   Hemoglobin 12.0 - 15.0 g/dL 12.6  11.4  12.9   Hematocrit 36.0 - 46.0 % 38.8  35.0  38.4   Platelets 150 - 400 K/uL 183  156  178.0     Lab Results  Component Value Date/Time   VD25OH 54 03/17/2020 11:52 AM   VD25OH 52 04/20/2013 02:59 PM    Clinical ASCVD: No  The 10-year ASCVD risk score (Arnett DK, et al., 2019) is: 9%   Values used to calculate the score:     Age: 66 years     Sex: Female     Is Non-Hispanic African American: No     Diabetic: No     Tobacco smoker: Yes     Systolic Blood Pressure: 315 mmHg     Is BP treated: No     HDL Cholesterol: 58.6 mg/dL     Total Cholesterol: 203 mg/dL       12/20/2021    9:43 AM 12/07/2021   12:05 PM 07/26/2021    3:36 PM  Depression screen PHQ 2/9  Decreased Interest 0 1 3  Down, Depressed, Hopeless 0 1 3  PHQ  - 2 Score 0 2 6  Altered sleeping 0 3 3  Tired, decreased energy $RemoveBeforeDE'1 2 3  'zbeKyWesjLPNyBy$ Change in appetite 0 2 0  Feeling bad or failure about yourself  0 0 0  Trouble concentrating 0 1 3  Moving slowly or fidgety/restless 0 1 3  Suicidal thoughts 0 0 0  PHQ-9 Score $RemoveBef'1 11 18  'OMZnqLgkjh$ Difficult doing work/chores Very difficult Somewhat difficult Somewhat difficult      Social History   Tobacco  Use  Smoking Status Former   Years: 30.00   Types: Cigarettes   Quit date: 12/2020   Years since quitting: 1.1  Smokeless Tobacco Never   BP Readings from Last 3 Encounters:  01/18/22 116/72  12/20/21 128/80  12/07/21 130/80   Pulse Readings from Last 3 Encounters:  01/18/22 (!) 59  12/20/21 72  12/07/21 71   Wt Readings from Last 3 Encounters:  01/18/22 175 lb 9.6 oz (79.7 kg)  12/20/21 176 lb (79.8 kg)  12/07/21 174 lb 6 oz (79.1 kg)   BMI Readings from Last 3 Encounters:  01/18/22 32.12 kg/m  12/20/21 32.19 kg/m  12/07/21 31.89 kg/m    Assessment/Interventions: Review of patient past medical history, allergies, medications, health status, including review of consultants reports, laboratory and other test data, was performed as part of comprehensive evaluation and provision of chronic care management services.   SDOH:  (Social Determinants of Health) assessments and interventions performed: No SDOH Interventions    Flowsheet Row Chronic Care Management from 02/16/2022 in [place] [facility] [place] from 07/26/2021 in [place] [facility] [place] Management from 03/22/2020 in [place] [facility] [place] from 07/10/2018 in [place] [facility] [place] from 07/09/2017 in [place] [facility] [place] Interventions -- Intervention Not Indicated -- -- --  Housing Interventions -- Intervention Not Indicated -- -- --  Transportation Interventions -- Intervention Not Indicated -- -- --   Depression Interventions/Treatment  -- Referral to Psychiatry -- Medication  [referral to behavioral health made. Advised to follow up with former psychiatrist, Elizebeth Koller, as well, if still practicing.] Medication, Counseling, Currently on Treatment  Financial Strain Interventions Intervention Not Indicated Intervention Not Indicated Intervention Not Indicated -- --  Physical Activity Interventions -- Intervention Not Indicated -- -- --  Stress Interventions -- Provide Counseling -- -- --  Social Connections Interventions -- Intervention Not Indicated -- -- --      SDOH Screenings   Food Insecurity: No Food Insecurity (07/26/2021)  Housing: Low Risk  (07/26/2021)  Transportation Needs: No Transportation Needs (07/26/2021)  Alcohol Screen: Low Risk  (07/26/2021)  Depression (PHQ2-9): Low Risk  (12/20/2021)  Recent Concern: Depression (PHQ2-9) - Medium Risk (12/07/2021)  Financial Resource Strain: Low Risk  (02/16/2022)  Physical Activity: Insufficiently Active (07/26/2021)  Social Connections: Socially Integrated (07/26/2021)  Stress: Stress Concern Present (07/26/2021)  Tobacco Use: Medium Risk (01/18/2022)    CCM Care Plan  Allergies  Allergen Reactions   Sulfonamide Derivatives Swelling    Facial swelling   Flexeril [Cyclobenzaprine] Other (See Comments)    Dizziness, made meniere's disease worse    Hydromorphone Itching   Morphine And Related Nausea And Vomiting   Sulfamethoxazole Other (See Comments)    Causes swelling     Medications Reviewed Today     Reviewed by Viona Gilmore, [facility] (Pharmacist) on 02/16/22 at 1158  Med List Status: <None>   Medication Order Taking? Sig Documenting Provider Last Dose Status Informant  acyclovir (ZOVIRAX) 400 MG tablet [id number]  Take 400 mg by mouth 4 (four) times daily. [provider]  Active Self           Med Note Nigel Mormon, Mickel Crow   Thu Nov 30, 2021 11:12 AM) prn  alprazolam [name] 2 MG tablet [id number]  Take 2 mg by mouth 3  (three) times daily as needed for sleep. [provider]  Active Self  Med Note Merlene Morse, KIMBERLY L   Thu Nov 30, 2021 11:15 AM) For anxiety per pill bottle  Ascorbic Acid (VITAMIN C) 1000 MG tablet [id number]  Take 500 mg by mouth daily. [provider]  Active Self  B Complex-C-Folic Acid (B-COMPLEX/VITAMIN C PO) [id number]  Take 1 tablet by mouth daily. [provider]  Active Self  Cholecalciferol (VITAMIN D3) 125 MCG (5000 UT) CAPS [id number]  Take 5,000 Units by mouth once a week. [provider]  Active Self  FLUoxetine (PROZAC) 40 MG capsule [id number] Yes Take 1 capsule (40 mg total) by mouth daily. Nelwyn Salisbury, MD Taking Active Self  hydrocortisone (ANUSOL-HC) 2.5 % rectal cream [id number]  Place 1 Application rectally 2 (two) times daily. Glynn Octave, MD  Active   Magnesium 500 MG CAPS [id number]  Take 500 mg by mouth daily. [provider]  Active Self           Med Note Merlene Morse, KIMBERLY L   Thu Nov 30, 2021 11:12 AM) Pt takes magnesium complex 225mg   omeprazole (PRILOSEC) 20 MG capsule  TAKE 1 CAPSULE BY MOUTH 2 TIMES DAILY BEFORE A MEAL. [id number], MD  Active Self           Med Note Nelwyn Salisbury, [facility]   Thu Nov 30, 2021 11:19 AM) Pt only takes once daily at night  Polyethyl Glycol-Propyl Glycol (SYSTANE) 0.4-0.3 % GEL ophthalmic gel Dec 02, 2021  Place 1 drop into both eyes daily as needed. [provider]  Active   polyethylene glycol (MIRALAX / GLYCOLAX) 17 g packet [id number]  Use one packet daily [id number], MD  Active Self  potassium chloride (KLOR-CON) 10 MEQ tablet Nelwyn Salisbury  TAKE 1 TABLET BY MOUTH DAILY. [id number], MD  Active Self  rosuvastatin (CRESTOR) 40 MG tablet Nelwyn Salisbury Yes Take 40 mg by mouth daily. [provider] Taking Active Self  traMADol (ULTRAM) 50 MG tablet [id number] Yes TAKE 1 TABLET BY MOUTH EVERY 12 HOURS AS NEEDED FOR SEVERE PAIN. [id number], MD Taking  Active Self            Patient Active Problem List   Diagnosis Date Noted   Pulmonary nodule 1 cm or greater in diameter 01/18/2022   Bleeding external hemorrhoids 12/07/2021   ILD (interstitial lung disease) (HCC) 12/07/2021   Chronic idiopathic constipation    Epigastric pain    History of colonic polyps    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   Hypokalemia 07/23/2018   Neck pain 05/06/2017   Angina pectoris 06/06/2016   PUD (peptic ulcer disease) 06/06/2016   Attention deficit hyperactivity disorder (ADHD) 02/07/2016   Obesity (BMI 30-39.9) 11/10/2015   Bipolar disorder 12/13/2009   Back pain, lumbar 01/21/2009   Edema, leg 01/21/2009   Hidradenitis 10/04/2008   Dizziness 04/16/2008   Bell's palsy 02/06/2008   Generalized anxiety disorder 01/23/2008   Major depressive disorder 01/23/2008   Essential hypertension 01/23/2008   Allergic rhinitis 01/23/2008   Gastro-esophageal reflux disease without esophagitis 01/23/2008   Insomnia 01/23/2008   Unspecified urinary incontinence 01/23/2008   Hyperlipidemia 04/15/2007   OSA (obstructive sleep apnea) 04/15/2007   Headache 04/15/2007    Immunization History  Administered Date(s) Administered   Influenza Split 04/26/2011   Influenza Whole 03/23/2010   Influenza,inj,Quad PF,6+ Mos 03/20/2013, 03/02/2015, 03/26/2016, 03/25/2017, 05/08/2019, 03/17/2020   Influenza-Unspecified 05/26/2014, 03/03/2018   Pneumococcal Conjugate-13 04/19/2016   Pneumococcal Polysaccharide-23 03/07/2018   Tdap 04/20/2013   Zoster Recombinat (Shingrix) 02/18/2019  Zoster, Live 03/26/2016   Patient has been a little lazy than lately and has put on some weight. Patient has not been as active because she has been short of breath when she does do activity. She is unsure if this is related to her weight or her lungs and has follow up with pulmonary to discuss.  Patient is using a glider ellipitical and doing yoga some but is limited in activity.  Discussed  incidental findings of aortic atherosclerosis and CAD from chest CT earlier this month. Patient's chart indicates a significant family history of CAD but patient could not recall while on the phone.  Patient lost her mother, brother, sister, sister in laws and first husband in a very short span and does not feel like she ever had time to grieve everyone.  Patient reports she would like to avoid future ED visits for constipation. She still uses Miralax daily and drinks lots of water. Recommended trying Colace as well as Miralax.  Conditions to be addressed/monitored:  Hypertension, Hyperlipidemia, GERD, Depression, Anxiety, Allergic Rhinitis and ADHD and back pain  Conditions addressed this visit: Hypertension, hyperlipidemia, anxiety  Care Plan : CCM Pharmacy Care Plan  Updates made by Viona Gilmore, [place] since 02/16/2022 12:00 AM     Problem: Problem: Hypertension, Hyperlipidemia, GERD, Depression, Anxiety, Allergic Rhinitis and ADHD and back pain      Long-Range Goal: Patient-Specific Goal   Start Date: 09/21/2020  Expected End Date: 09/21/2021  Recent Progress: On track  Priority: High  Note:   Current Barriers:  Unable to independently monitor therapeutic efficacy Unable to achieve control of cholesterol  Unable to self administer medications as prescribed  Pharmacist Clinical Goal(s):  Patient will achieve adherence to monitoring guidelines and medication adherence to achieve therapeutic efficacy achieve control of cholesterol as evidenced by next lipid panel  through collaboration with PharmD and provider.   Interventions: 1:1 collaboration with Laurey Morale, MD regarding development and update of comprehensive plan of care as evidenced by provider attestation and co-signature Inter-disciplinary care team collaboration (see longitudinal plan of care) Comprehensive medication review performed; medication list updated in electronic medical record  Hypertension (BP goal  <130/80) -Controlled -Current treatment: No medications  -Medications previously tried: n/a  -Current home readings: 116/72 (brand new arm cuff - usually in the yellow 120-129 or blue is < 120) -Current dietary habits: been eating more fruit and vegetables and not eating out at all -Current exercise habits: would like to start walking and recommended trying yoga on youtube to improve balance -Denies hypotensive/hypertensive symptoms -Educated on Daily salt intake goal < 2300 mg; Exercise goal of 150 minutes per week; -Counseled to monitor BP at home as directed, document, and provide log at future appointments -Counseled on diet and exercise extensively -Recommended bring her BP cuff to next office visit to ensure accuracy.  Hyperlipidemia/CAD: (LDL goal < 70) -Uncontrolled -Current treatment: Rosuvastatin 40mg , 1 tablet once daily  - Appropriate, Query effective, Safe, Accessible -Medications previously tried: none  -Current dietary patterns: doing much better with diet; not eating out at all and eating lots of fruits. Seafood and stir fry vegetables -Current exercise habits: no consistent exercise -Educated on Cholesterol goals;  Benefits of statin for ASCVD risk reduction; Importance of limiting foods high in cholesterol; Exercise goal of 150 minutes per week; -Counseled on diet and exercise extensively Recommended addition of Zetia 10 mg daily and aspirin 81 mg daily due to CAD.  Depression/Anxiety/bipolar disorder (Goal: minimize symptoms) -Not ideally controlled -  Current treatment: fluoxetine 40 mg, 1 tablet once daily - Appropriate, Effective, Safe, Accessible alprazolam 2mg , 1 tablet three times daily as needed (patient reports sometimes not taking once a day) - Appropriate, Effective, Query Safe, Accessible -Medications previously tried/failed: Seroquel, Lithium -PHQ9: 0 -GAD7: n/a -Connected with [place] phone number [phone]) for mental health  support -Educated on Benefits of medication for symptom control Benefits of cognitive-behavioral therapy with or without medication -Collaborated with CCM Education officer, museum for assistance with finding counselor.  Insomnia (Goal: improve quality and quantity of sleep) -Controlled -Current treatment  Inspire device  Hydroxyzine 50 mg 1 tablet at bedtime as needed - Appropriate, Effective, Safe, Accessible -Medications previously tried: Seroquel, Zzquil, melatonin -Counseled on worsening of sleep apnea with use of alprazolam.  Allergic rhinitis (Goal: minimize symptoms) -Controlled -Current treatment  azelastine (Astelin) 0.1% nasal spray, 2 sprays into both nostrils twice daily as directed - Appropriate, Effective, Safe, Accessible -Medications previously tried: none  -Recommended to continue current medication  Arthritis pain (Goal: minimize pain) -Controlled -Current treatment  Osteo Bi-flex (glucosamine condroitin & vitamin D) 2000 units daily - Appropriate, Effective, Safe, Accessible -Medications previously tried: none  -Counseled on giving the medicine a few weeks trial before deciding if it is helping; discussed stopping vitamin D supplementation with this as the replacement  GERD (Goal: minimize symptoms) -Controlled -Current treatment  Omeprazole 20 mg 1 capsule twice daily (once daily) - Appropriate, Effective, Safe, Accessible -Medications previously tried: none  -Recommended to continue current medication  Low potassium (Goal: 3.5-5) -Controlled -Current treatment  Potassium chloride 10 mEq 1 tablet daily - Appropriate, Effective, Safe, Accessible -Medications previously tried: none  -Recommended repeat potassium level  Low back pain (Goal: minimize pain) -Not ideally controlled -Current treatment  Tramadol 50 mg 1 tablet every 12 hours as needed - Appropriate, Effective, Query Safe, Accessible -Medications previously tried: hydrocodone -Counseled on separating  from other sedating medications  ADHD (Goal: remain focused) -Controlled -Current treatment  No medications -Medications previously tried: Adderall (no longer needed) -Recommended to continue current medication   Health Maintenance -Vaccine gaps: second dose of shingrix, COVID vaccine -Current therapy:  Vitamin E 1 tablet once a week Magnesium 500 mg daily Vitamin B complex 1 capsule daily Multivitamin 1 tablet daily Acyclovir 400 mg 1 tablet four times daily - patient only takes once daily -Educated on Cost vs benefit of each product must be carefully weighed by individual consumer -Patient is satisfied with current therapy and denies issues -Recommended to continue current medication  Patient Goals/Self-Care Activities Patient will:  - take medications as prescribed focus on medication adherence by filling out weekly pill box and marking calendar for doses taken target a minimum of 150 minutes of moderate intensity exercise weekly  Follow Up Plan: The care management team will reach out to the patient again over the next 14 days.         Medication Assistance: None required.  Patient affirms current coverage meets needs.  Compliance/Adherence/Medication fill history: Care Gaps: COVID vaccine, shingrix (2nd dose), DEXA, influenza vaccine, mammogram BP- 116/72 01/18/22   [facility]: Rosuvastatin 40 mg - Last filled 11/28/21 90 DS at [place]  Patient's preferred pharmacy is:  [place], [place] [address] Phone: [phone] Fax: [phone]  [place] [facility] [place] [address] Phone: [phone] Fax: [phone]   Uses pill box? Yes Pt endorses 80% compliance -  husband is filling the pillbox and they are using the calendar to mark the days she has taken her medications (filling pillbox on Mondays)  We  discussed: Benefits of medication synchronization, packaging and delivery as well as enhanced pharmacist oversight with [facility]. Patient decided to: Continue current medication management strategy  Care Plan and Follow Up Patient Decision:  Patient agrees to Care Plan and Follow-up.  Plan: The care management team will reach out to the patient again over the next 14 days.  Jeni Salles, PharmD [facility] Clinical Pharmacist [place] [facility] [place]

## 2022-02-23 MED ORDER — EZETIMIBE 10 MG PO TABS: 10.0000 mg | ORAL_TABLET | Freq: Every day | ORAL | 3 refills | Status: DC

## 2022-02-23 NOTE — Telephone Encounter (Signed)
Done

## 2022-02-26 ENCOUNTER — Telehealth: Payer: Self-pay | Admitting: Pharmacist

## 2022-02-26 NOTE — Telephone Encounter (Signed)
Called patient to follow up on Zetia prescription sent in and aspirin recommendations. Patient was aware about the Zetia and already started taking it per the CCM visit discussion on 9/1. Patient will start on aspirin 81 mg daily and added it to her medication list. Will call back to schedule follow up lipid panel.

## 2022-02-26 NOTE — Telephone Encounter (Signed)
-----   Message from Nelwyn Salisbury, MD sent at 02/23/2022  9:27 AM EDT ----- Regarding: RE: CAD and cholesterol therapy Yes both of these are good ideas. Please tell her to start on ASA 81 mg daily. I will send in the RX for Zetia. Thanks  ----- Message ----- From: Verner Chol, [facility] Sent: 02/16/2022   4:32 PM EDT To: Nelwyn Salisbury, MD Subject: CAD and cholesterol therapy                    Hi,  I spoke with [name] [name] and noticed that both aortic atherosclerosis and CAD were noted in her most recent chest CT scan. Based on that, I think she would benefit from a more aggressive LDL goal of < 70. What do you think about adding Zetia daily? I spoke with her about it and she was definitely in favor of doing everything we can to lower her cholesterol. Also, what do you think about having her start aspirin 81 mg daily given her CAD? She does have a significant family history of CAD as well.  Let me know! [name]

## 2022-02-27 ENCOUNTER — Telehealth: Payer: Self-pay | Admitting: Family Medicine

## 2022-02-27 DIAGNOSIS — E785 Hyperlipidemia, unspecified: Secondary | ICD-10-CM

## 2022-02-27 NOTE — Telephone Encounter (Signed)
Done

## 2022-02-27 NOTE — Telephone Encounter (Signed)
-----   Message from Verner Chol, [facility] sent at 02/26/2022  3:49 PM EDT ----- Regarding: RE: CAD and cholesterol therapy Ok I called her to let her know! Thank you! Would you also be able to also order a lipid panel so I can have her schedule that in 2-3 months from starting the Zetia? No rush! ----- Message ----- From: Nelwyn Salisbury, MD Sent: 02/23/2022   9:28 AM EDT To: Verner Chol, RPH Subject: RE: CAD and cholesterol therapy                Yes both of these are good ideas. Please tell her to start on ASA 81 mg daily. I will send in the RX for Zetia. Thanks  ----- Message ----- From: Verner Chol, [facility] Sent: 02/16/2022   4:32 PM EDT To: Nelwyn Salisbury, MD Subject: CAD and cholesterol therapy                    Hi,  I spoke with Ms. Soltys and noticed that both aortic atherosclerosis and CAD were noted in her most recent chest CT scan. Based on that, I think she would benefit from a more aggressive LDL goal of < 70. What do you think about adding Zetia daily? I spoke with her about it and she was definitely in favor of doing everything we can to lower her cholesterol. Also, what do you think about having her start aspirin 81 mg daily given her CAD? She does have a significant family history of CAD as well.  Let me know! [name]

## 2022-03-01 ENCOUNTER — Other Ambulatory Visit: Payer: Self-pay | Admitting: Family Medicine

## 2022-03-02 NOTE — Telephone Encounter (Signed)
Last OV CPE-12/20/2021 No future OV scheduled.

## 2022-03-15 ENCOUNTER — Telehealth: Payer: Self-pay | Admitting: Licensed Clinical Social Worker

## 2022-03-15 NOTE — Patient Instructions (Signed)
Visit Information  Thank you for taking time to visit with me today. Please don't hesitate to contact me if I can be of assistance to you.   Following are the goals we discussed today:   Goals Addressed             This Visit's Progress    Management of MH Conditions   On track    Care Coordination Interventions: Solution-Focused Strategies employed:  Mindfulness or Relaxation training provided Active listening / Reflection utilized  Emotional Support Provided Verbalization of feelings encouraged  Pt reports hx of extensive grief within the last 4 years Pt is interested in supportive resources in assisting with management of mental health conditions. Initial appt scheduled        Our next appointment is by telephone on 03/27/22 at 10:30 AM  Please call the care guide team at [phone] if you need to cancel or reschedule your appointment.   If you are experiencing a Mental Health or [place] or need someone to talk to, please call the Suicide and Crisis Lifeline: 988 call 911   Patient verbalizes understanding of instructions and care plan provided today and agrees to view in [place]. Active MyChart status and patient understanding of how to access instructions and care plan via MyChart confirmed with patient.     [name] [name], MSW, [name] Phone [phone] 5:09 PM

## 2022-03-15 NOTE — Patient Outreach (Addendum)
  Care Coordination   Initial Visit Note   03/15/2022 Name: [name] [name] MRN: [id number] DOB: June 26, 1955  [name] [name] is a 66 y.o. year old female who sees [name] Morale, MD for primary care. I spoke with  [name] [name] by phone today.  What matters to the patients health and wellness today?  Care Coordination    Goals Addressed             This Visit's Progress    Management of MH Conditions   On track    Care Coordination Interventions: Solution-Focused Strategies employed:  Mindfulness or Relaxation training provided Active listening / Reflection utilized  Emotional Support Provided Verbalization of feelings encouraged  Pt reports hx of extensive grief within the last 4 years Pt is interested in supportive resources in assisting with management of mental health conditions. Initial appt scheduled        SDOH assessments and interventions completed:  No     Care Coordination Interventions Activated:  Yes  Care Coordination Interventions:  Yes, provided   Follow up plan: Follow up call scheduled for 03/27/22    Encounter Outcome:  Pt. Scheduled   Christa See, MSW, [email or web address] Phone [phone] 5:08 PM

## 2022-03-17 DIAGNOSIS — I2510 Atherosclerotic heart disease of native coronary artery without angina pectoris: Secondary | ICD-10-CM

## 2022-03-17 DIAGNOSIS — I10 Essential (primary) hypertension: Secondary | ICD-10-CM | POA: Diagnosis not present

## 2022-03-17 DIAGNOSIS — E785 Hyperlipidemia, unspecified: Secondary | ICD-10-CM | POA: Diagnosis not present

## 2022-03-17 DIAGNOSIS — F319 Bipolar disorder, unspecified: Secondary | ICD-10-CM

## 2022-03-20 ENCOUNTER — Other Ambulatory Visit: Payer: Self-pay

## 2022-03-20 ENCOUNTER — Telehealth: Payer: Self-pay | Admitting: Pharmacist

## 2022-03-20 ENCOUNTER — Other Ambulatory Visit: Payer: Self-pay | Admitting: Family Medicine

## 2022-03-20 MED ORDER — POTASSIUM CHLORIDE ER 10 MEQ PO TBCR: 10.0000 meq | EXTENDED_RELEASE_TABLET | Freq: Every day | ORAL | 0 refills | Status: DC

## 2022-03-20 NOTE — Telephone Encounter (Addendum)
Called patient to schedule lipid panel. Scheduled for December 5th. Scheduled CCM follow up after lipid panel.  Patient inquired about her supplements and if she needed to continue with them all. Recommended stopping vitamin C given duplication with vitamin B complex with vitamin C in it. Removed it from medication list.  Patient also requested a PCP visit for her back pain that is worsening. She is busy the next 2 weeks but wanted an appt in 2 weeks to discuss. She feels like the tramadol is not really working.

## 2022-03-22 DIAGNOSIS — R42 Dizziness and giddiness: Secondary | ICD-10-CM | POA: Diagnosis not present

## 2022-03-26 DIAGNOSIS — H90A22 Sensorineural hearing loss, unilateral, left ear, with restricted hearing on the contralateral side: Secondary | ICD-10-CM | POA: Diagnosis not present

## 2022-03-27 ENCOUNTER — Telehealth: Payer: Self-pay | Admitting: Licensed Clinical Social Worker

## 2022-03-27 ENCOUNTER — Ambulatory Visit: Payer: Self-pay | Admitting: Licensed Clinical Social Worker

## 2022-03-27 NOTE — Patient Outreach (Signed)
  Care Coordination   03/27/2022 Name: [name] [name] MRN: [id number] DOB: [birth year]   Care Coordination Outreach Attempts: An unsuccessful scheduled telephone outreach was attempted today. LCSW left a detailed message requesting a return call.   Follow Up Plan:  LCSW will await a return call. Additional outreach attempts will be made to offer the patient care coordination information and services.   Encounter Outcome:  No Answer  Care Coordination Interventions Activated:  No   Care Coordination Interventions:  No, not indicated    Christa See, MSW, [email or web address] Phone [phone] 10:36 AM

## 2022-03-28 NOTE — Patient Outreach (Signed)
  Care Coordination   Initial Visit Note   03/28/2022 Name: [name] [name] MRN: [id number] DOB: 09-May-1956  [name] [name] is a 66 y.o. year old female who sees Laurey Morale, MD for primary care. I spoke with  [name] by phone today.  What matters to the patients health and wellness today?  Chronic Pain Management and Healthy Coping Skills    Goals Addressed             This Visit's Progress    Management of MH Conditions   On track    Care Coordination Interventions: Solution-Focused Strategies employed:  Mindfulness or Relaxation training provided Active listening / Reflection utilized  Emotional Support Provided Verbalization of feelings encouraged  Pt reports hx of extensive grief within the last 4 years Patient reports strong support from spouse Pt endorsed feelings of depression due to decreased quality of life with unmanaged chronic health conditions. Within the last two weeks, pt had four "bad falls" Currently pt states she is "feeling so much better" She contributes this to practicing healthy coping skills and spending increased time outside, walking with personalized staff by spouse LCSW commended pt for maintaining consistency in goals resulting in decrease of depression/anxiety symptoms Validation and Encouragement was provided Pt identified strategies to assist with chronic pain (utilizing heating pads, medication compliance, attending scheduled provider appts)  Pt plans to address continued difficulty sleeping with providers at upcoming appts, reviewed by LCSW              SDOH assessments and interventions completed:  Yes  SDOH Interventions Today    Flowsheet Row Most Recent Value  SDOH Interventions   Housing Interventions Intervention Not Indicated  Transportation Interventions Intervention Not Indicated        Care Coordination Interventions Activated:  Yes  Care Coordination Interventions:  Yes, provided   Follow up  plan: Follow up call scheduled for 04/24/22    Encounter Outcome:  Pt. Visit Completed   Christa See, MSW, [email or web address] Phone [phone] 10:30 AM

## 2022-03-28 NOTE — Patient Instructions (Signed)
Visit Information  Thank you for taking time to visit with me today. Please don't hesitate to contact me if I can be of assistance to you.   Following are the goals we discussed today:   Goals Addressed             This Visit's Progress    Management of MH Conditions   On track    Care Coordination Interventions: Solution-Focused Strategies employed:  Mindfulness or Relaxation training provided Active listening / Reflection utilized  Emotional Support Provided Verbalization of feelings encouraged  Pt reports hx of extensive grief within the last 4 years Patient reports strong support from spouse Pt endorsed feelings of depression due to decreased quality of life with unmanaged chronic health conditions. Within the last two weeks, pt had four "bad falls" Currently pt states she is "feeling so much better" She contributes this to practicing healthy coping skills and spending increased time outside, walking with personalized staff by spouse LCSW commended pt for maintaining consistency in goals resulting in decrease of depression/anxiety symptoms Validation and Encouragement was provided Pt identified strategies to assist with chronic pain (utilizing heating pads, medication compliance, attending scheduled provider appts)  Pt plans to address continued difficulty sleeping with providers at upcoming appts, reviewed by LCSW              Our next appointment is by telephone on 04/24/22 at 10:30 AM  Please call the care guide team at [phone] if you need to cancel or reschedule your appointment.   If you are experiencing a Mental Health or [place] or need someone to talk to, please call the Suicide and Crisis Lifeline: 988 call 911   Patient verbalizes understanding of instructions and care plan provided today and agrees to view in [place]. Active MyChart status and patient understanding of how to access instructions and care plan via MyChart confirmed with  patient.     Christa See, MSW, [email or web address] Phone [phone] 10:31 AM

## 2022-03-29 ENCOUNTER — Ambulatory Visit: Payer: PPO | Admitting: Pulmonary Disease

## 2022-03-30 ENCOUNTER — Telehealth: Payer: Self-pay | Admitting: Family Medicine

## 2022-03-30 NOTE — Telephone Encounter (Signed)
Yes she can try this, but I highly doubt it will do any good

## 2022-03-30 NOTE — Telephone Encounter (Signed)
Pt's spouse called to ask if it would be alright for Pt to start taking Golo for weight loss/management?  Spouse confirmed appt for 04/03/22, but stated he'd like to be pro-active and maybe get something started today.  Please advise.  Mullan,John (Spouse)  [phone] (Mobile)

## 2022-03-30 NOTE — Telephone Encounter (Signed)
Spoke with pt advised of Dr Sarajane Jews recommendation, state that the medication makes her feel sick and that's the reason she stopped taking, pt states that the husband is the one concerned about pt loosing weight. Pt has a f/u appointment on the 04/03/22 to discuss this with Dr Sarajane Jews

## 2022-04-03 ENCOUNTER — Encounter: Payer: Self-pay | Admitting: Family Medicine

## 2022-04-03 ENCOUNTER — Ambulatory Visit (INDEPENDENT_AMBULATORY_CARE_PROVIDER_SITE_OTHER): Payer: PPO | Admitting: Family Medicine

## 2022-04-03 ENCOUNTER — Ambulatory Visit: Payer: PPO | Admitting: Neurology

## 2022-04-03 VITALS — BP 118/70 | HR 71 | Temp 98.3°F | Wt 185.0 lb

## 2022-04-03 DIAGNOSIS — R42 Dizziness and giddiness: Secondary | ICD-10-CM

## 2022-04-03 DIAGNOSIS — M5440 Lumbago with sciatica, unspecified side: Secondary | ICD-10-CM

## 2022-04-03 DIAGNOSIS — M546 Pain in thoracic spine: Secondary | ICD-10-CM | POA: Diagnosis not present

## 2022-04-03 DIAGNOSIS — G8929 Other chronic pain: Secondary | ICD-10-CM | POA: Diagnosis not present

## 2022-04-03 MED ORDER — GABAPENTIN 100 MG PO CAPS: 100.0000 mg | ORAL_CAPSULE | Freq: Two times a day (BID) | ORAL | 0 refills | Status: DC

## 2022-04-03 NOTE — Progress Notes (Signed)
   Subjective:    Patient ID: [name] [name], female    DOB: 08-13-1955, 66 y.o.   MRN: [id number]  HPI Here to discuss her constant dizziness and her back pain. She saw Dr. Redmond Baseman on 03-22-22, and he felt that she definitely does not have a vestibular problem. He thinks this is a more central CNS issue. She is scheduled to see Dr. Tomi Likens again on 04-09-22. She has been having more pain in the middle and lower back the past month, and she wants to see Dr. Saintclair Halsted again. She is taking Tramadol twice a day with little relief.    Review of Systems  Constitutional: Negative.   Respiratory: Negative.    Cardiovascular: Negative.   Musculoskeletal:  Positive for back pain.  Neurological:  Positive for dizziness. Negative for tremors, seizures, syncope, facial asymmetry, speech difficulty, weakness, light-headedness, numbness and headaches.       Objective:   Physical Exam Constitutional:      General: She is not in acute distress.    Appearance: Normal appearance.  Cardiovascular:     Rate and Rhythm: Normal rate and regular rhythm.     Pulses: Normal pulses.     Heart sounds: Normal heart sounds.  Pulmonary:     Effort: Pulmonary effort is normal.     Breath sounds: Normal breath sounds.  Neurological:     General: No focal deficit present.     Mental Status: She is alert. Mental status is at baseline.           Assessment & Plan:  For the dizziness, she will see Dr. Tomi Likens as above. For the back pain, we will refer her to see Dr. Saintclair Halsted again. In the meantime she will try taking Gabapentin 100 mg BID. Alysia Penna, MD

## 2022-04-04 ENCOUNTER — Telehealth: Payer: Self-pay | Admitting: Family Medicine

## 2022-04-04 DIAGNOSIS — R42 Dizziness and giddiness: Secondary | ICD-10-CM

## 2022-04-04 NOTE — Telephone Encounter (Signed)
Wants to let provider know Dr Tomi Likens (neurologist) canceled patient's appointment because he does not feel it was a neurological issue.  Patient still needs head scan which he was going to request from [facility].patient spouse requests call to his number

## 2022-04-04 NOTE — Addendum Note (Signed)
Addended by: Alysia Penna A on: 04/04/2022 04:53 PM   Modules accepted: Orders

## 2022-04-04 NOTE — Telephone Encounter (Signed)
I ordered a brain MRI scan. They will call them about the appt

## 2022-04-05 NOTE — Telephone Encounter (Signed)
Called spoke with pt and pt husband [name] [name].   Message given concerning brain MRI order.

## 2022-04-09 ENCOUNTER — Telehealth: Payer: Self-pay | Admitting: Family Medicine

## 2022-04-09 ENCOUNTER — Ambulatory Visit: Payer: PPO | Admitting: Neurology

## 2022-04-09 NOTE — Telephone Encounter (Addendum)
Pt's spouse called to say [facility] told them they would not be able to do the procedure because of the implant.  They were told it must be done at  [facility] or [facility].   Please call Pt back as soon as possible, to discuss.

## 2022-04-10 DIAGNOSIS — I739 Peripheral vascular disease, unspecified: Secondary | ICD-10-CM | POA: Diagnosis not present

## 2022-04-10 DIAGNOSIS — M5440 Lumbago with sciatica, unspecified side: Secondary | ICD-10-CM | POA: Diagnosis not present

## 2022-04-10 NOTE — Telephone Encounter (Signed)
Please advise if ok to schedule pt OV to discuss this

## 2022-04-11 NOTE — Telephone Encounter (Signed)
I understand. Please call to cancel the MRI. We'll figure something else out

## 2022-04-12 ENCOUNTER — Other Ambulatory Visit (HOSPITAL_COMMUNITY): Payer: Self-pay | Admitting: Neurosurgery

## 2022-04-12 DIAGNOSIS — I739 Peripheral vascular disease, unspecified: Secondary | ICD-10-CM

## 2022-04-12 NOTE — Telephone Encounter (Signed)
Spoke with [facility] regarding pt MRI, State that they already cancelled the referral since pt cant have done there

## 2022-04-13 ENCOUNTER — Ambulatory Visit (HOSPITAL_COMMUNITY)
Admission: RE | Admit: 2022-04-13 | Discharge: 2022-04-13 | Disposition: A | Discharge status: Home / Self Care | Payer: PPO | Source: Ambulatory Visit | Attending: Vascular Surgery | Admitting: Vascular Surgery

## 2022-04-13 DIAGNOSIS — I739 Peripheral vascular disease, unspecified: Secondary | ICD-10-CM | POA: Insufficient documentation

## 2022-04-23 ENCOUNTER — Other Ambulatory Visit: Payer: PPO

## 2022-04-24 ENCOUNTER — Ambulatory Visit: Payer: Self-pay | Admitting: Licensed Clinical Social Worker

## 2022-04-24 NOTE — Patient Outreach (Signed)
  Care Coordination   Follow Up Visit Note   04/24/2022 Name: [name] [name] MRN: [id number] DOB: [birth year]  [name] [name] is a 66 y.o. year old female who sees Laurey Morale, MD for primary care. I spoke with  [name] [name] and spouse, [name] [name], by phone today.  What matters to the patients health and wellness today?  Healthy coping skills, stress management, and medical tx plan (f/up with specialists)    Goals Addressed             This Visit's Progress    Management of MH Conditions   On track    Care Coordination Interventions: Solution-Focused Strategies employed:  Mindfulness or Relaxation training provided Active listening / Reflection utilized  Emotional Support Provided Provided psychoeducation for mental health needs  Caregiver stress acknowledged  Verbalization of feelings encouraged  LCSW spoke with both patient and spouse, [name] [name] Medication has been helping a little; however, causes her to feel sleepy Family have met with specialists, per PCP recommendations. Follow up appts are in the process of being scheduled noting some are pending test results/availability Family are active in assisting pt with weight management noting prescribed medications have resulted in weight gain. Pt walks routinely. Spouse and adult daughter researches recipes to implement in pt's diet regimen Patient continues to receive strong support from family LCSW assessed symptoms of depression and anxiety. Pt continues to practice gratitude thinking and aspects of acceptance, which has positively impacted pt's success in management of mental health conditions. LCSW discussed additional strategies to assist with stress management Pt and spouse successfully identified healthy coping skills that they can participate in individually or together             SDOH assessments and interventions completed:  No     Care Coordination Interventions Activated:  Yes  Care Coordination  Interventions:  Yes, provided   Follow up plan: Follow up call scheduled for 4-6 weeks    Encounter Outcome:  Pt. Visit Completed   Christa See, MSW, [email or web address] Phone [phone] 5:22 PM

## 2022-04-24 NOTE — Patient Instructions (Signed)
Visit Information  Thank you for taking time to visit with me today. Please don't hesitate to contact me if I can be of assistance to you.   Following are the goals we discussed today:   Goals Addressed             This Visit's Progress    Management of MH Conditions   On track    Care Coordination Interventions: Solution-Focused Strategies employed:  Mindfulness or Relaxation training provided Active listening / Reflection utilized  Emotional Support Provided Provided psychoeducation for mental health needs  Caregiver stress acknowledged  Verbalization of feelings encouraged  LCSW spoke with both patient and spouse, John Medication has been helping a little; however, causes her to feel sleepy Family have met with specialists, per PCP recommendations. Follow up appts are in the process of being scheduled noting some are pending test results/availability Family are active in assisting pt with weight management noting prescribed medications have resulted in weight gain. Pt walks routinely. Spouse and adult daughter researches recipes to implement in pt's diet regimen Patient continues to receive strong support from family LCSW assessed symptoms of depression and anxiety. Pt continues to practice gratitude thinking and aspects of acceptance, which has positively impacted pt's success in management of mental health conditions. LCSW discussed additional strategies to assist with stress management Pt and spouse successfully identified healthy coping skills that they can participate in individually or together             Our next appointment is by telephone on 06/05/22 at 10:30 AM  Please call the care guide team at [phone] if you need to cancel or reschedule your appointment.   If you are experiencing a Mental Health or Behavioral Health Crisis or need someone to talk to, please call the Suicide and Crisis Lifeline: 988 call 911   Patient verbalizes understanding of  instructions and care plan provided today and agrees to view in MyChart. Active MyChart status and patient understanding of how to access instructions and care plan via MyChart confirmed with patient.      , MSW, LCSW [facility] [place]  [email or web address] Phone [phone] 5:23 PM     

## 2022-05-01 ENCOUNTER — Encounter: Payer: Self-pay | Admitting: Cardiology

## 2022-05-01 ENCOUNTER — Telehealth: Payer: Self-pay | Admitting: *Deleted

## 2022-05-01 ENCOUNTER — Ambulatory Visit: Payer: PPO | Attending: Cardiology | Admitting: Cardiology

## 2022-05-01 VITALS — BP 130/72 | HR 69 | Ht 62.0 in | Wt 180.4 lb

## 2022-05-01 DIAGNOSIS — G4733 Obstructive sleep apnea (adult) (pediatric): Secondary | ICD-10-CM

## 2022-05-01 DIAGNOSIS — I10 Essential (primary) hypertension: Secondary | ICD-10-CM

## 2022-05-01 NOTE — Progress Notes (Signed)
Date:  05/01/2022   ID:  [name] [name], DOB 12-08-1955, MRN [id number] The patient was identified using 2 identifiers.  PCP:  Nelwyn Salisbury, MD   [facility] Providers Cardiologist:  None     Evaluation Performed:  Follow-Up Visit  Chief Complaint:  OSA  History of Present Illness:    [name] [name] is a 66 y.o. female with a history of ADHD, bipolar disorder, hypertension, GERD, hyperlipidemia, obesity.  She has a hx of OSA on CPAP therapy.  Unfortunately she was intolerant to CPAP and requested ENT evaluation of  Inspire. Device.  She underwent hypoglossal nerve stimulator implant by Dr. Jenne Pane on 03/25/2019.    She was seen back on 05/01/2019 for device activation.  She was then seen in the office on 10/06/2019 and was complaining of feeling very sleepy and felt that her device need to be reprogrammed.  She was having a lot of daytime sleepiness as well as snoring.  Nerve stimulation was performed and her amplitude was increased from 1.7 to 2.5 V.  Her control range was changed to 2.3 to 3.3 V.  A few weeks later she had an in lab study done 04/30/2020 showed which showed no significant obstructive sleep apnea using her inspire device.    I then saw her back 07/31/2021 and she was doing great with her inspire device and was on level 5 which was 2.7 V.  She was feeling rested when she get up in the morning and had no daytime sleepiness.  She had no tongue sensitivity and her husband said she was very lightly snoring.  At that time an Itamar sleep study was ordered which showed moderate obstructive sleep apnea with an AHI of 17.9/h and O2 sats as low as 81%.  She is now brought back into the office for further evaluation due to an increase in her AHI.  At last office visit she was had really struggling with her inspire device and has not been able to sleep at night.  She was having a severe sore throat when she would wake up up in the morning and felt like her tongue was all the  way out of her mouth all night and very painful in the morning.  At that office visit s interrogation of her device was performed and showed that her thresholds had decreased to 1 V and it was likely that she had had a decrease in stimulation threshold over time due to healing and did not require a high pacing threshold any longer and was resulting in overstimulation of the hypoglossal nerve which was changing her anatomy  resulting in more apneas. Her parameters were changed with the stimulation range of 1.4 V to 1.8 V.  Her goal was to get to level 2 or 3 which would be 1.5 to 1.6 V.  Since I saw her last she underwent Innovar sleep study to make sure that her apneas were stable on current inspire settings.  This was done in June and showed mild obstructive sleep apnea with an AHI 7.3/h with no significant central events and no nocturnal hypoxemia.  She is here today.  Unfortunately she has started snoring again even when she is on her side.  She has gained over 15 lbs since May.   She is using her device 7.5 hours nightly.  She is currently on level 5 which is 1.8 volts.  She has recently been feeling sleepy recently.  She has been placed on Gabapentin that has  caused weight gain. She also was just dx with ILD and emphysema. The gabapentin makes her sleepy as well.  Past Medical History:  Diagnosis Date   Allergic rhinitis 01/23/2008   Angina pectoris 06/06/2016   Arthritis    Attention deficit hyperactivity disorder (ADHD) 02/07/2016   Back pain, lumbar 01/21/2009   Bell's palsy 02/06/2008   Bipolar disorder 12/13/2009   Chronic idiopathic constipation    Dizziness 04/16/2008   Edema, leg 01/21/2009   Epigastric pain    Essential hypertension 01/23/2008   Gastro-esophageal reflux disease without esophagitis 01/23/2008   Generalized anxiety disorder 01/23/2008   Headache 04/15/2007   Hidradenitis 10/04/2008   History of colonic polyps    Hyperlipidemia    Hypokalemia 07/23/2018    Insomnia 01/23/2008   Major depressive disorder 01/23/2008   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   Neck pain 05/06/2017   Obesity (BMI 30-39.9) 11/10/2015   OSA (obstructive sleep apnea) 04/15/2007   inspire implant; does not always remember to turn device on   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 Wynetta Emery   BREAST BIOPSY Right 1981   benign   BREAST EXCISIONAL BIOPSY Right 1981   benign   scar does not show   BREAST SURGERY Bilateral 12/11/2012   reductions per Dr. Ivin Booty in [facility]    CATARACT EXTRACTION, BILATERAL Bilateral last summer of 2018   at [facility]   COLONOSCOPY  03/07/2021   per Dr. Loreta Ave, adenomatous polyps, repeat in 7 yrs   DRUG INDUCED ENDOSCOPY N/A 01/30/2019   Procedure: DRUG INDUCED ENDOSCOPY;  Surgeon: Christia Reading, MD;  Location: [facility];  Service: ENT;  Laterality: N/A;   ESOPHAGOGASTRODUODENOSCOPY  04/23/2011   per Dr. Loreta Ave, normal    EYE SURGERY  06/18/2016   L eyelid    IMPLANTATION OF HYPOGLOSSAL NERVE STIMULATOR Right 03/25/2019   Procedure: IMPLANTATION OF HYPOGLOSSAL NERVE STIMULATOR;  Surgeon: Christia Reading, MD;  Location: [facility] OR;  Service: ENT;  Laterality: Right;   LUMBAR DISC SURGERY  2003   Dr. Wynetta Emery   MULTIPLE TOOTH EXTRACTIONS     ORIF WRIST FRACTURE Right 08/29/2018   REDUCTION MAMMAPLASTY Bilateral 2013   SPLIT NIGHT STUDY  11/11/2015   UVULOPALATOPHARYNGOPLASTY     VAGINAL HYSTERECTOMY  05/1995   secondary to fibroids.     Current Meds  Medication Sig   acyclovir (ZOVIRAX) 400 MG tablet TAKE 1 TABLET BY MOUTH 4 TIMES A DAY AS NEEDED   alprazolam (XANAX) 2 MG tablet TAKE 1 TABLET BY MOUTH 3 TIMES DAILY AS NEEDED FOR ANXIETY.    aspirin EC 81 MG tablet Take 81 mg by mouth daily. Swallow whole.   B Complex-C-Folic Acid (B-COMPLEX/VITAMIN C PO) Take 1 tablet by mouth daily.   Cholecalciferol (VITAMIN D3) 125 MCG (5000 UT) CAPS Take 5,000 Units by mouth once a week.   ezetimibe (ZETIA) 10 MG tablet Take 1 tablet (10 mg total) by mouth daily.   FLUoxetine (PROZAC) 40 MG capsule Take 1 capsule (40 mg total) by mouth daily.   gabapentin (NEURONTIN) 100 MG capsule Take 1 capsule (100 mg total) by mouth 2 (two) times daily.   hydrocortisone (ANUSOL-HC) 2.5 %  rectal cream Place 1 Application rectally 2 (two) times daily.   Magnesium 500 MG CAPS Take 500 mg by mouth daily.   omeprazole (PRILOSEC) 20 MG capsule TAKE 1 CAPSULE BY MOUTH 2 TIMES DAILY BEFORE A MEAL.   Polyethyl Glycol-Propyl Glycol (SYSTANE) 0.4-0.3 % GEL ophthalmic gel Place 1 drop into both eyes daily as needed.   polyethylene glycol (MIRALAX / GLYCOLAX) 17 g packet Use one packet daily   potassium chloride (KLOR-CON) 10 MEQ tablet Take 1 tablet (10 mEq total) by mouth daily.   rosuvastatin (CRESTOR) 40 MG tablet Take 40 mg by mouth daily.   traMADol (ULTRAM) 50 MG tablet TAKE 1 TABLET BY MOUTH EVERY 12 HOURS AS NEEDED FOR SEVERE PAIN.     Allergies:   Sulfonamide derivatives, Flexeril [cyclobenzaprine], Hydromorphone, Morphine and related, and Sulfamethoxazole   Social History   Tobacco Use   Smoking status: Former    Years: 30.00    Types: Cigarettes    Quit date: 12/2020    Years since quitting: 1.3   Smokeless tobacco: Never  Vaping Use   Vaping Use: Never used  Substance Use Topics   Alcohol use: Not Currently   Drug use: No     Family Hx: The patient's family history includes Alcohol abuse in an other family member; Arthritis in an other family member; Asthma in her father; Coronary artery disease in her brother, sister, and another family member; Dementia in her mother; Depression in an other family member; Diabetes in an other family member;  Hyperlipidemia in her brother, mother, sister, sister, and another family member; Hypertension in her brother, sister, sister, and another family member; Sudden death in an other family member; Thyroid disease in her sister.  ROS:   Please see the history of present illness.     All other systems reviewed and are negative.   Prior CV studies:   The following studies were reviewed today:  Home sleep study  Labs/Other Tests and Data Reviewed:    EKG:  No ECG reviewed.  Recent Labs: 11/30/2021: B Natriuretic Peptide 26.3 12/05/2021: BUN 13; Creatinine, Ser 0.72; Hemoglobin 12.6; Platelets 183; Potassium 4.0; Sodium 141 12/07/2021: ALT 19 12/20/2021: TSH 3.67   Recent Lipid Panel Lab Results  Component Value Date/Time   CHOL 203 (H) 12/20/2021 10:12 AM   TRIG 177.0 (H) 12/20/2021 10:12 AM   HDL 58.60 12/20/2021 10:12 AM   CHOLHDL 3 12/20/2021 10:12 AM   LDLCALC 109 (H) 12/20/2021 10:12 AM   LDLCALC 124 (H) 03/17/2020 11:52 AM   LDLDIRECT 129.0 12/07/2021 11:14 AM    Wt Readings from Last 3 Encounters:  05/01/22 180 lb 6.4 oz (81.8 kg)  04/03/22 185 lb (83.9 kg)  01/18/22 175 lb 9.6 oz (79.7 kg)     Risk Assessment/Calculations:          Objective:    Vital Signs:  BP 130/72   Pulse 69   Ht 5\' 2"  (1.575 m)   Wt 180 lb 6.4 oz (81.8 kg)   SpO2 97%   BMI 33.00 kg/m   GEN: Well nourished, well developed in no acute distress HEENT: Normal NECK: No JVD; No carotid bruits LYMPHATICS: No lymphadenopathy CARDIAC:RRR, no murmurs, rubs, gallops RESPIRATORY:  Clear to auscultation without rales, wheezing or rhonchi  ABDOMEN: Soft, non-tender, non-distended MUSCULOSKELETAL:  No edema; No deformity  SKIN: Warm and dry NEUROLOGIC:  Alert and oriented x 3 PSYCHIATRIC:  Normal affect  ASSESSMENT & PLAN:    OSA -intolerant to CPAP therapy -  s/p hypoglossal nerve stimulator -As stated above her stimulation range was increased to 2.3-3.3V at office visit 09/2019 with repeat  sleep study in November 2021 showing no residual sleep apnea -Itamar sleep study was done 08/08/2021 for follow-up which showed moderate obstructive sleep apnea with an AHI of 17.9/h -At last OV she was having a lot of difficulty with sore throat in the morning as well as feeling like her tongue is out of her mouth the hallway all night and has not been sleeping well at all -Her thresholds had decreased to 1 V and it was felt that she had had a decrease in stimulation threshold over time due to healing and did not require a high pacing threshold any longef resulting in overstimulation of the hypoglossal nerve which is changing her anatomy and was resulting in more apneas. -Her parameters were changed with the stimulation range of 1.4 V to 1.8 V with a  goal  to get to level 2 or 3 which would be 1.5 to 1.6 V. -She is doing great on the current settings and is on level 5 with complete resolution of her mouth dryness and sore throat and has no daytime sleepiness -A Itamar home sleep study in June 2023 showed residual mild obstructive sleep apnea on level 5 at 1.8V with an AHI of 7.3/h but down from 17/hour at her baseline study -she has had increased snoring on her side recently but has gained 15lbs since May and suspect that she is having more apneas -increased voltage was tried in office today but has significant protrusion with angulation to the left.  -will keep at level 5 at 1.8V and repeat Itamar HST to see if AHIs have increased with the 15lb weight gain  2.  HTN -BP is controlled on exam today -has not required any antihypertensive therapy     Time:   Today, I have spent 15 minutes with the patient with telehealth technology discussing the above problems.     Medication Adjustments/Labs and Tests Ordered: Current medicines are reviewed at length with the patient today.  Concerns regarding medicines are outlined above.   Tests Ordered: No orders of the defined types were placed in this  encounter.   Medication Changes: No orders of the defined types were placed in this encounter.   Follow Up:  Followup with me after sleep study  Signed, Armanda Magic, MD  05/01/2022 10:51 AM    [facility]

## 2022-05-01 NOTE — Patient Instructions (Signed)
Medication Instructions:  Your physician recommends that you continue on your current medications as directed. Please refer to the Current Medication list given to you today.  *If you need a refill on your cardiac medications before your next appointment, please call your pharmacy*   Testing/Procedures: Your physician has recommended that you have a sleep study. This test records several body functions during sleep, including: brain activity, eye movement, oxygen and carbon dioxide blood levels, heart rate and rhythm, breathing rate and rhythm, the flow of air through your mouth and nose, snoring, body muscle movements, and chest and belly movement.    Follow-Up: At [facility], you and your health needs are our priority.  As part of our continuing mission to provide you with exceptional heart care, we have created designated Provider Care Teams.  These Care Teams include your primary Cardiologist (physician) and Advanced Practice Providers (APPs -  Physician Assistants and Nurse Practitioners) who all work together to provide you with the care you need, when you need it.  Your next appointment:   6 month(s)  The format for your next appointment:   In Person  Provider:  Dr. Mayford Knife   Important Information About Sugar

## 2022-05-01 NOTE — Telephone Encounter (Signed)
Dr. Mayford Knife ordered another Itamar sleep study today, see ov notes from MD. Pt is agreeable to signed waiver and not open the box until she has been called with the PIN #.

## 2022-05-01 NOTE — Addendum Note (Signed)
Addended by: Alvin Critchley A on: 05/01/2022 11:07 AM   Modules accepted: Orders

## 2022-05-03 ENCOUNTER — Other Ambulatory Visit: Payer: Self-pay | Admitting: Family Medicine

## 2022-05-04 ENCOUNTER — Telehealth: Payer: Self-pay | Admitting: *Deleted

## 2022-05-04 NOTE — Telephone Encounter (Signed)
Notified Carol Fiato ok to activate itamar device. Per Nina no PA is required.  

## 2022-05-04 NOTE — Telephone Encounter (Signed)
Called and made the patient aware that she may proceed with the [facility] Sleep Study. PIN # provided to the patient. Patient made aware that she will be contacted after the test has been read with the results and any recommendations. Patient verbalized understanding and thanked me for the call.    Pt has been given PIN# [id number] and will do study between now and early next week.

## 2022-05-13 ENCOUNTER — Encounter (HOSPITAL_BASED_OUTPATIENT_CLINIC_OR_DEPARTMENT_OTHER): Payer: PPO | Admitting: Cardiology

## 2022-05-13 DIAGNOSIS — G4733 Obstructive sleep apnea (adult) (pediatric): Secondary | ICD-10-CM

## 2022-05-16 NOTE — Procedures (Signed)
      SLEEP STUDY REPORT Patient Information Study Date: 05/13/2022 Patient Name: [name] [name] Patient ID: [id number] Birth Date: 02-14-1956 Age: 66 Gender: Female BMI: 30.8 (W=167 lb, H=5' 2'') Referring Physician: Armanda Magic, MD  TEST DESCRIPTION: Home sleep apnea testing was completed using the WatchPat, a Type 1 device, utilizing peripheral arterial tonometry (PAT), chest movement, actigraphy, pulse oximetry, pulse rate, body position and snore. AHI was calculated with apnea and hypopnea using valid sleep time as the denominator. RDI includes apneas, hypopneas, and RERAs. The data acquired and the scoring of sleep and all associated events were performed in accordance with the recommended standards and specifications as outlined in the AASM Manual for the Scoring of Sleep and Associated Events 2.2.0 (2015).   FINDINGS:   1. Mild Obstructive Sleep Apnea with AHI 9/hr.   2. No Central Sleep Apnea with pAHIc 0.9/hr.   3. Mild snoring was present. O2 sats were < 88% for 0.9 min.   5. Total sleep time was 9 hrs and 1 min.   6. 17.4% of total sleep time was spent in REM sleep.   7. Normal sleep onset latency at 16 min.   8. Prolonged REM sleep onset latency at 210 min.   9. Total awakenings were 8 .  10. Arrhythmia detection: None.  DIAGNOSIS: Mild Obstructive Sleep Apnea (G47.33)  RECOMMENDATIONS:   1.  Clinical correlation of these findings is necessary.  The decision to treat obstructive sleep apnea (OSA) is usually based on the presence of apnea symptoms or the presence of associated medical conditions such as Hypertension, Congestive Heart Failure, Atrial Fibrillation or Obesity.  The most common symptoms of OSA are snoring, gasping for breath while sleeping, daytime sleepiness and fatigue.   2.  Recommend followup in office for further fine tuning of Inpsire device due to increased apneas and snoring.   3.  Healthy sleep recommendations include:  adequate nightly sleep  (normal 7-9 hrs/night), avoidance of caffeine after noon and alcohol near bedtime, and maintaining a sleep environment that is cool, dark and quiet.  4.  Weight loss for overweight patients is recommended.  Even modest amounts of weight loss can significantly improve the severity of sleep apnea.  5.  Snoring recommendations include:  weight loss where appropriate, side sleeping, and avoidance of alcohol before bed.  6.  Operation of motor vehicle should be avoided when sleepy.  Signature:   Armanda Magic, MD; [facility]; Diplomat, American Board of Sleep Medicine Electronically Signed: 05/16/2022

## 2022-05-17 ENCOUNTER — Telehealth: Payer: Self-pay | Admitting: *Deleted

## 2022-05-17 NOTE — Telephone Encounter (Signed)
-----   Message from Gaynelle Cage, CMA sent at 05/17/2022  9:18 AM EST -----  ----- Message ----- From: [name], MD Sent: 05/16/2022  12:33 PM EST To: Cv Div Sleep Studies  Please let patient know that AHIs have increased and please get back in office with Inspire rep to consider changes to her device

## 2022-05-17 NOTE — Telephone Encounter (Signed)
The patient has been notified of the result and verbalized understanding.  All questions (if any) were answered. Latrelle Dodrill, CMA 05/17/2022 5:47 PM

## 2022-05-21 ENCOUNTER — Telehealth: Payer: Self-pay | Admitting: Cardiology

## 2022-05-21 NOTE — Telephone Encounter (Signed)
Patient states that she was told to call in and speak with Carly regarding setting up an appointment with Dr. Mayford Knife and the inspire reps. Please advise.

## 2022-05-22 ENCOUNTER — Telehealth: Payer: Self-pay | Admitting: Pharmacist

## 2022-05-22 ENCOUNTER — Other Ambulatory Visit (INDEPENDENT_AMBULATORY_CARE_PROVIDER_SITE_OTHER): Payer: PPO

## 2022-05-22 DIAGNOSIS — E785 Hyperlipidemia, unspecified: Secondary | ICD-10-CM

## 2022-05-22 LAB — LIPID PANEL
Cholesterol: 157 mg/dL (ref 0–200)
HDL: 58.3 mg/dL (ref >39.00)
LDL Cholesterol: 71 mg/dL (ref 0–99)
NonHDL: 98.41
Total CHOL/HDL Ratio: 3
Triglycerides: 135 mg/dL (ref 0.0–149.0)
VLDL: 27 mg/dL (ref 0.0–40.0)

## 2022-05-22 LAB — HEPATIC FUNCTION PANEL
ALT: 25 U/L (ref 0–35)
AST: 29 U/L (ref 0–37)
Albumin: 4.5 g/dL (ref 3.5–5.2)
Alkaline Phosphatase: 51 U/L (ref 39–117)
Bilirubin, Direct: 0.1 mg/dL (ref 0.0–0.3)
Total Bilirubin: 0.4 mg/dL (ref 0.2–1.2)
Total Protein: 7.6 g/dL (ref 6.0–8.3)

## 2022-05-22 NOTE — Chronic Care Management (AMB) (Signed)
    Chronic Care Management Pharmacy Assistant   Name: [name]  MRN: [id number] DOB: 1955/11/23  05/22/22 APPOINTMENT REMINDER    Patient was reminded to have all medications, supplements and any blood glucose and blood pressure readings available for review with Gaylord Shih, Pharm. D, for telephone visit on 05/23/22 at 2:30.    Care Gaps: COVID Booster - Overdue Flu Vaccine - Overdue Mammogram - Postponed DEXA - Postponed Zoster Vaccine - Postponed Bp- 130/72 05/01/22 AWV- 07/26/21   Star Rating Drug: Rosuvastatin 40 mg - Last filled 03/24/22 90 DS at [facility]       Medications: Outpatient Encounter Medications as of 05/22/2022  Medication Sig   acyclovir (ZOVIRAX) 400 MG tablet TAKE 1 TABLET BY MOUTH 4 TIMES A DAY AS NEEDED   alprazolam (XANAX) 2 MG tablet TAKE 1 TABLET BY MOUTH 3 TIMES DAILY AS NEEDED FOR ANXIETY.   aspirin EC 81 MG tablet Take 81 mg by mouth daily. Swallow whole.   B Complex-C-Folic Acid (B-COMPLEX/VITAMIN C PO) Take 1 tablet by mouth daily.   Cholecalciferol (VITAMIN D3) 125 MCG (5000 UT) CAPS Take 5,000 Units by mouth once a week.   ezetimibe (ZETIA) 10 MG tablet Take 1 tablet (10 mg total) by mouth daily.   FLUoxetine (PROZAC) 40 MG capsule Take 1 capsule (40 mg total) by mouth daily.   gabapentin (NEURONTIN) 100 MG capsule TAKE 1 CAPSULE BY MOUTH 2 TIMES DAILY.   hydrocortisone (ANUSOL-HC) 2.5 % rectal cream Place 1 Application rectally 2 (two) times daily.   Magnesium 500 MG CAPS Take 500 mg by mouth daily.   omeprazole (PRILOSEC) 20 MG capsule TAKE 1 CAPSULE BY MOUTH 2 TIMES DAILY BEFORE A MEAL.   Polyethyl Glycol-Propyl Glycol (SYSTANE) 0.4-0.3 % GEL ophthalmic gel Place 1 drop into both eyes daily as needed.   polyethylene glycol (MIRALAX / GLYCOLAX) 17 g packet Use one packet daily   potassium chloride (KLOR-CON) 10 MEQ tablet Take 1 tablet (10 mEq total) by mouth daily.   rosuvastatin (CRESTOR) 40 MG tablet Take 40 mg by  mouth daily.   traMADol (ULTRAM) 50 MG tablet TAKE 1 TABLET BY MOUTH EVERY 12 HOURS AS NEEDED FOR SEVERE PAIN.   No facility-administered encounter medications on file as of 05/22/2022.      Pamala Duffel CMA Clinical Pharmacist Assistant [phone]

## 2022-05-23 ENCOUNTER — Ambulatory Visit (INDEPENDENT_AMBULATORY_CARE_PROVIDER_SITE_OTHER): Payer: PPO | Admitting: Pharmacist

## 2022-05-23 DIAGNOSIS — I10 Essential (primary) hypertension: Secondary | ICD-10-CM

## 2022-05-23 DIAGNOSIS — E785 Hyperlipidemia, unspecified: Secondary | ICD-10-CM

## 2022-05-23 NOTE — Telephone Encounter (Signed)
Spoke with the patient and scheduled her for an appointment.

## 2022-05-23 NOTE — Progress Notes (Signed)
Chronic Care Management Pharmacy Note  05/24/2022 Name:  [name] [name] MRN:  [id number] DOB:  1956-03-29  Summary: LDL is almost at goal < 70 Pt reports she is experiencing excessive sleepiness and not sleeping well at night   Recommendations/Changes made from today's visit: -Recommended follow up with cardiology to address inspire device settings -Recommend continuing with current cholesterol regimen but if headaches persist, consider switching Zetia to Nexletol   Plan: Tolerance assessment in 1 month BP assessment in 3 months Follow up in 6 months  Subjective: [name] [name] is an 66 y.o. year old female who is a primary patient of Laurey Morale, MD.  The CCM team was consulted for assistance with disease management and care coordination needs.    Engaged with patient by telephone for follow up visit in response to provider referral for pharmacy case management and/or care coordination services.   Consent to Services:  The patient was given information about Chronic Care Management services, agreed to services, and gave verbal consent prior to initiation of services.  Please see initial visit note for detailed documentation.   Patient Care Team: Laurey Morale, MD as PCP - General Sueanne Margarita, MD as Consulting Physician (Cardiology) Viona Gilmore, [facility] as Pharmacist (Pharmacist)  Recent office visits: 04/03/22 Laurey Morale, MD: Patient presented for dizziness. Recommended gabapentin 100 mg BID.   02/26/22 Telephone encounter: Zetia 10 mg daily and aspirin 81 mg were prescribed.   12/20/21 Laurey Morale, MD - Patient presented for Preventative health care and other concerns. Stopped Docusate Sodium. Stopped Melatonin.   12/07/21 Laurey Morale, MD - Patient presented for Bleeding external hemorrhoids and other concerns. No medication changes.  Recent consult visits: 05/13/22 Fransico Him, MD (cardiology): Patient presented for a sleep study.  05/01/22  Fransico Him, MD (cardiology): Patient presented for OSA follow up.  Plan for repeat sleep study.  03/22/22 Melida Quitter, MD (ENT): Patient presented for dizziness.   01/30/22 Rigoberto Noel, MD - Patient presented for Chronic respiratory failure with hypoxia and other concerns. Changed Fluoxetine.   12/14/21 Hessie Knows, MD (ENT) - Patient presented for Meniere's disease, unspecified laterality. No medication changes.   11/23/21 Sueanne Margarita, MD (Cardiology) - Patient presented for OSA. No medication changes.   10/18/21 Sueanne Margarita, MD (Cardiology) - Patient presented via video for OSA and other concerns. No medication changes.  09/05/21 Fransico Him, MD (cardiology): Patient presented for OSA follow up. Adjusted setting on inspire device.  08/16/21 Metta Clines, DO (neurology): Patient presented for chronic dizziness and memory deficits. Recommended PRN follow up.   Hospital visits: Medication Reconciliation was completed by comparing discharge summary, patient's EMR and Pharmacy list, and upon discussion with patient.   Patient presented to [facility] ED on 11/21/21 due to External Hemorrhoids. Patient was present for 6 hours.   New?Medications Started at [facility] Discharge:?? -started  hydrocortisone 2.5 % rectal cream   Medication Changes at Hospital Discharge: -Changed  none   Medications Discontinued at Hospital Discharge: -Stopped  none   Medications that remain the same after Hospital Discharge:??  -All other medications will remain the same.     Medication Reconciliation was completed by comparing discharge summary, patient's EMR and Pharmacy list, and upon discussion with patient.   Patient presented to [facility] ED on 11/30/21 due to right sided  chest pain. Patient was present for 5 hours.   New?Medications Started at [facility] Discharge:?? -started  none   Medication Changes at Hospital Discharge: -Changed  none   Medications  Discontinued at Hospital Discharge: -Stopped  none   Medications that remain the same after Hospital Discharge:??  -All other medications will remain the same.      Objective:  Lab Results  Component Value Date   CREATININE 0.72 12/05/2021   BUN 13 12/05/2021   GFR 90.38 06/22/2021   GFRNONAA >60 12/05/2021   GFRAA >60 03/23/2019   NA 141 12/05/2021   K 4.0 12/05/2021   CALCIUM 10.2 12/05/2021   CO2 32 12/05/2021   GLUCOSE 114 (H) 12/05/2021    Lab Results  Component Value Date/Time   HGBA1C 5.9 12/20/2021 10:12 AM   HGBA1C 6.1 05/01/2021 10:15 AM   GFR 90.38 06/22/2021 10:47 AM   GFR 83.28 05/01/2021 10:15 AM    Last diabetic Eye exam: No results found for: "HMDIABEYEEXA"  Last diabetic Foot exam: No results found for: "HMDIABFOOTEX"   Lab Results  Component Value Date   CHOL 157 05/22/2022   HDL 58.30 05/22/2022   LDLCALC 71 05/22/2022   LDLDIRECT 129.0 12/07/2021   TRIG 135.0 05/22/2022   CHOLHDL 3 05/22/2022       Latest Ref Rng & Units 05/22/2022   10:55 AM 12/07/2021   11:14 AM 11/30/2021   10:22 AM  Hepatic Function  Total Protein 6.0 - 8.3 g/dL 7.6  8.2  7.2   Albumin 3.5 - 5.2 g/dL 4.5  4.7  4.2   AST 0 - 37 U/L _0 ALT 0 - 35 U/L _1 Alk Phosphatase 39 - 117 U/L 51  53  45   Total Bilirubin 0.2 - 1.2 mg/dL 0.4  0.3  0.4   Bilirubin, Direct 0.0 - 0.3 mg/dL 0.1  0.1  0.1     Lab Results  Component Value Date/Time   TSH 3.67 12/20/2021 10:12 AM   TSH 2.46 05/01/2021 10:15 AM       Latest Ref Rng & Units 12/05/2021    9:24 PM 11/30/2021   10:16 AM 05/01/2021   10:15 AM  CBC  WBC 4.0 - 10.5 K/uL 6.6  5.6  6.7   Hemoglobin 12.0 - 15.0 g/dL 12.6  11.4  12.9   Hematocrit 36.0 - 46.0 % 38.8  35.0  38.4   Platelets 150 - 400 K/uL 183  156  178.0     Lab Results  Component Value Date/Time   VD25OH 54 03/17/2020 11:52 AM   VD25OH 52 04/20/2013 02:59 PM    Clinical ASCVD: No  The 10-year ASCVD risk score (Arnett DK, et al.,  2019) is: 13.1%   Values used to calculate the score:     Age: 66 years     Sex: Female     Is Non-Hispanic African American: No     Diabetic: No     Tobacco smoker: Yes     Systolic Blood Pressure: 625 mmHg     Is BP treated: Yes     HDL Cholesterol: 58.3 mg/dL     Total Cholesterol: 157 mg/dL       04/03/2022    1:48 PM 12/20/2021    9:43 AM 12/07/2021   12:05 PM  Depression screen PHQ 2/9  Decreased Interest 3 0 1  Down, Depressed, Hopeless 1 0 1  PHQ - 2 Score 4 0 2  Altered sleeping 3 0 3  Tired, decreased energy 3 1 2  Change in appetite 3 0 2  Feeling bad or failure about yourself  3 0 0  Trouble concentrating 3 0 1  Moving slowly or fidgety/restless 3 0 1  Suicidal thoughts 0 0 0  PHQ-9 Score _0 Difficult doing work/chores Extremely dIfficult Very difficult Somewhat difficult      Social History   Tobacco Use  Smoking Status Former   Years: 30.00   Types: Cigarettes   Quit date: 12/2020   Years since quitting: 1.4  Smokeless Tobacco Never   BP Readings from Last 3 Encounters:  05/01/22 130/72  04/03/22 118/70  01/18/22 116/72   Pulse Readings from Last 3 Encounters:  05/01/22 69  04/03/22 71  01/18/22 (!) 59   Wt Readings from Last 3 Encounters:  05/01/22 180 lb 6.4 oz (81.8 kg)  04/03/22 185 lb (83.9 kg)  01/18/22 175 lb 9.6 oz (79.7 kg)   BMI Readings from Last 3 Encounters:  05/01/22 33.00 kg/m  04/03/22 33.84 kg/m  01/18/22 32.12 kg/m    Assessment/Interventions: Review of patient past medical history, allergies, medications, health status, including review of consultants reports, laboratory and other test data, was performed as part of comprehensive evaluation and provision of chronic care management services.   SDOH:  (Social Determinants of Health) assessments and interventions performed: Yes  SDOH Interventions    Flowsheet Row Chronic Care Management from 05/23/2022 in [place] at [place] from  03/27/2022 in [place] Management from 02/16/2022 in [place] at [place] from 07/26/2021 in [place] at [place] Management from 03/22/2020 in [place] at [place] from 07/10/2018 in [place] at [place] Interventions -- -- -- Intervention Not Indicated -- --  Housing Interventions -- Intervention Not Indicated -- Intervention Not Indicated -- --  Transportation Interventions -- Intervention Not Indicated -- Intervention Not Indicated -- --  Depression Interventions/Treatment  -- -- -- Referral to Psychiatry -- Medication  [referral to behavioral health made. Advised to follow up with former psychiatrist, Elizebeth Koller, as well, if still practicing.]  Financial Strain Interventions Intervention Not Indicated -- Intervention Not Indicated Intervention Not Indicated Intervention Not Indicated --  Physical Activity Interventions -- -- -- Intervention Not Indicated -- --  Stress Interventions -- -- -- Provide Counseling -- --  Social Connections Interventions -- -- -- Intervention Not Indicated -- --      SDOH Screenings   Food Insecurity: No Food Insecurity (07/26/2021)  Housing: Low Risk  (03/27/2022)  Transportation Needs: No Transportation Needs (03/27/2022)  Alcohol Screen: Low Risk  (07/26/2021)  Depression (PHQ2-9): High Risk (04/03/2022)  Financial Resource Strain: Low Risk  (05/24/2022)  Physical Activity: Insufficiently Active (07/26/2021)  Social Connections: Socially Integrated (07/26/2021)  Stress: Stress Concern Present (07/26/2021)  Tobacco Use: Medium Risk (05/01/2022)    CCM Care Plan  Allergies  Allergen Reactions   Sulfonamide Derivatives Swelling    Facial swelling   Flexeril [Cyclobenzaprine] Other (See Comments)    Dizziness, made meniere's disease worse    Hydromorphone Itching    Morphine And Related Nausea And Vomiting   Sulfamethoxazole Other (See Comments)    Causes swelling     Medications Reviewed Today     Reviewed by Viona Gilmore, [facility] (Pharmacist) on 05/23/22 at 1634  Med List Status: <None>   Medication Order Taking? Sig Documenting Provider Last Dose Status Informant  acyclovir (ZOVIRAX) 400 MG tablet [id number]  TAKE 1 TABLET BY MOUTH 4 TIMES A DAY AS NEEDED Laurey Morale, MD  Active   alprazolam [name] 2 MG tablet [id number]  TAKE 1 TABLET BY MOUTH 3 TIMES DAILY AS NEEDED FOR ANXIETY. Laurey Morale, MD  Active   aspirin EC 81 MG tablet [id number]  Take 81 mg by mouth daily. Swallow whole. [provider]  Active   B Complex-C-Folic Acid (B-COMPLEX/VITAMIN C PO) [id number]  Take 1 tablet by mouth daily. [provider]  Active Self  Cholecalciferol (VITAMIN D3) 125 MCG (5000 UT) CAPS [id number]  Take 5,000 Units by mouth once a week. [provider]  Active Self  ezetimibe (ZETIA) 10 MG tablet [id number]  Take 1 tablet (10 mg total) by mouth daily. Laurey Morale, MD  Active   FLUoxetine (PROZAC) 40 MG capsule [id number]  Take 1 capsule (40 mg total) by mouth daily. Laurey Morale, MD  Active Self  gabapentin (NEURONTIN) 100 MG capsule [id number]  TAKE 1 CAPSULE BY MOUTH 2 TIMES DAILY. Laurey Morale, MD  Active   hydrocortisone (ANUSOL-HC) 2.5 % rectal cream [id number]  Place 1 Application rectally 2 (two) times daily. Ezequiel Essex, MD  Active   Magnesium 500 MG CAPS [id number]  Take 500 mg by mouth daily. [provider]  Active Self           Med Note (COX, CANDICE A   Tue May 01, 2022 10:29 AM)    omeprazole (PRILOSEC) 20 MG capsule [id number]  TAKE 1 CAPSULE BY MOUTH 2 TIMES DAILY BEFORE A MEAL. Laurey Morale, MD  Active Self           Med Note (COX, CANDICE A   Tue May 01, 2022 10:29 AM)    Polyethyl Glycol-Propyl Glycol (SYSTANE) 0.4-0.3 % GEL ophthalmic gel [id number]  Place 1 drop into both eyes daily as needed.  [provider]  Active   polyethylene glycol (MIRALAX / GLYCOLAX) 17 g packet [id number]  Use one packet daily Laurey Morale, MD  Active Self  potassium chloride (KLOR-CON) 10 MEQ tablet [id number]  Take 1 tablet (10 mEq total) by mouth daily. Laurey Morale, MD  Active   rosuvastatin (CRESTOR) 40 MG tablet [id number]  Take 40 mg by mouth daily. [provider]  Active Self  traMADol (ULTRAM) 50 MG tablet [id number]  TAKE 1 TABLET BY MOUTH EVERY 12 HOURS AS NEEDED FOR SEVERE PAIN. Laurey Morale, MD  Active             Patient Active Problem List   Diagnosis Date Noted   Chronic midline thoracic back pain 04/03/2022   Pulmonary nodule 1 cm or greater in diameter 01/18/2022   Bleeding external hemorrhoids 12/07/2021   ILD (interstitial lung disease) ([place]) 12/07/2021   Chronic idiopathic constipation    Epigastric pain    History of colonic polyps    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   Hypokalemia 07/23/2018   Neck pain 05/06/2017   Angina pectoris 06/06/2016   PUD (peptic ulcer disease) 06/06/2016   Attention deficit hyperactivity disorder (ADHD) 02/07/2016   Obesity (BMI 30-39.9) 11/10/2015   Bipolar disorder 12/13/2009   Back pain, lumbar 01/21/2009   Edema, leg 01/21/2009   Hidradenitis 10/04/2008   Dizziness 04/16/2008   Bell's palsy 02/06/2008   Generalized anxiety disorder 01/23/2008   Major depressive disorder 01/23/2008   Essential hypertension 01/23/2008   Allergic rhinitis 01/23/2008   Gastro-esophageal reflux disease without  esophagitis 01/23/2008   Insomnia 01/23/2008   Unspecified urinary incontinence 01/23/2008   Hyperlipidemia 04/15/2007   OSA (obstructive sleep apnea) 04/15/2007   Headache 04/15/2007    Immunization History  Administered Date(s) Administered   Influenza Split 04/26/2011   Influenza Whole 03/23/2010   Influenza,inj,Quad PF,6+ Mos 03/20/2013, 03/02/2015, 03/26/2016, 03/25/2017, 05/08/2019, 03/17/2020   Influenza-Unspecified  05/26/2014, 03/03/2018   Pneumococcal Conjugate-13 04/19/2016   Pneumococcal Polysaccharide-23 03/07/2018   Tdap 04/20/2013   Zoster Recombinat (Shingrix) 02/18/2019   Zoster, Live 03/26/2016   Patient is extremely sleepy quite often but is not sleeping well at night. She is aware that she needs adjustments to her inspire device and they already made one adjustment. She is not sleeping much and she has a follow up with them scheduled in 2 weeks. She was having 50 episodes and hour and now down to 9 episodes an hour.   Patient reports she was told to take the gabapentin with the tramadol for her back but together they cause a lot of sedation.   Patient reports having a headache almost every day and thinks it started around the time of starting on Zetia.  Patient is drinking plenty of water. Discussed how the poor quality and lack of sleep could be contributing to the headaches and patient had not yet thought of that.  Conditions to be addressed/monitored:  Hypertension, Hyperlipidemia, GERD, Depression, Anxiety, Allergic Rhinitis and ADHD and back pain  Conditions addressed this visit: Hypertension, hyperlipidemia, anxiety  Care Plan : CCM Pharmacy Care Plan  Updates made by Viona Gilmore, [place] since 05/24/2022 12:00 AM     Problem: Problem: Hypertension, Hyperlipidemia, GERD, Depression, Anxiety, Allergic Rhinitis and ADHD and back pain      Long-Range Goal: Patient-Specific Goal   Start Date: 09/21/2020  Expected End Date: 09/21/2021  Recent Progress: On track  Priority: High  Note:   Current Barriers:  Unable to independently monitor therapeutic efficacy Unable to self administer medications as prescribed  Pharmacist Clinical Goal(s):  Patient will achieve adherence to monitoring guidelines and medication adherence to achieve therapeutic efficacy achieve control of cholesterol as evidenced by next lipid panel  through collaboration with PharmD and provider.    Interventions: 1:1 collaboration with Laurey Morale, MD regarding development and update of comprehensive plan of care as evidenced by provider attestation and co-signature Inter-disciplinary care team collaboration (see longitudinal plan of care) Comprehensive medication review performed; medication list updated in electronic medical record  Hypertension (BP goal <130/80) -Controlled -Current treatment: No medications  -Medications previously tried: n/a  -Current home readings: 116/72 (brand new arm cuff - usually in the yellow 120-129 or blue is < 120) -Current dietary habits: been eating more fruit and vegetables and not eating out at all -Current exercise habits: would like to start walking and recommended trying yoga on youtube to improve balance -Denies hypotensive/hypertensive symptoms -Educated on Daily salt intake goal < 2300 mg; Exercise goal of 150 minutes per week; -Counseled to monitor BP at home as directed, document, and provide log at future appointments -Counseled on diet and exercise extensively -Recommended bring her BP cuff to next office visit to ensure accuracy.  Hyperlipidemia/CAD: (LDL goal < 70) -Controlled -Current treatment: Rosuvastatin 3m, 1 tablet once daily  - Appropriate, Query effective, Safe, Accessible Zetia 10 mg 1 tablet daily - Appropriate, Effective, Safe, Accessible Aspirin 81 mg 1 tablet daily - Appropriate, Effective, Safe, Accessible -Medications previously tried: none  -Current dietary patterns: doing much better with diet; not eating  out at all and eating lots of fruits. Seafood and stir fry vegetables -Current exercise habits: no consistent exercise -Educated on Cholesterol goals;  Benefits of statin for ASCVD risk reduction; Importance of limiting foods high in cholesterol; Exercise goal of 150 minutes per week; -Counseled on diet and exercise extensively Recommended follow up on tolerances and headaches with Zetia in 1  month.  Depression/Anxiety/bipolar disorder (Goal: minimize symptoms) -Not ideally controlled -Current treatment: fluoxetine 40 mg, 1 tablet once daily - Appropriate, Effective, Safe, Accessible alprazolam 74m, 1 tablet three times daily as needed (patient reports sometimes not taking once a day) - Appropriate, Effective, Query Safe, Accessible -Medications previously tried/failed: Seroquel, Lithium -PHQ9: 0 -GAD7: n/a -Connected with [facility] number ([phone] for mental health support -Educated on Benefits of medication for symptom control Benefits of cognitive-behavioral therapy with or without medication -Collaborated with CCM sEducation officer, museumfor assistance with finding counselor.  Insomnia (Goal: improve quality and quantity of sleep) -Controlled -Current treatment  Inspire device  Hydroxyzine 50 mg 1 tablet at bedtime as needed - Appropriate, Effective, Safe, Accessible -Medications previously tried: Seroquel, Zzquil, melatonin -Counseled on worsening of sleep apnea with use of alprazolam.  Allergic rhinitis (Goal: minimize symptoms) -Controlled -Current treatment  azelastine (Astelin) 0.1% nasal spray, 2 sprays into both nostrils twice daily as directed - Appropriate, Effective, Safe, Accessible -Medications previously tried: none  -Recommended to continue current medication  Arthritis pain (Goal: minimize pain) -Controlled -Current treatment  Osteo Bi-flex (glucosamine condroitin & vitamin D) 2000 units daily - Appropriate, Effective, Safe, Accessible -Medications previously tried: none  -Counseled on giving the medicine a few weeks trial before deciding if it is helping; discussed stopping vitamin D supplementation with this as the replacement  GERD (Goal: minimize symptoms) -Controlled -Current treatment  Omeprazole 20 mg 1 capsule twice daily (once daily) - Appropriate, Effective, Safe, Accessible -Medications previously tried: none   -Recommended to continue current medication  Low potassium (Goal: 3.5-5) -Controlled -Current treatment  Potassium chloride 10 mEq 1 tablet daily - Appropriate, Effective, Safe, Accessible -Medications previously tried: none  -Recommended repeat potassium level  Low back pain (Goal: minimize pain) -Not ideally controlled -Current treatment  Tramadol 50 mg 1 tablet every 12 hours as needed - Appropriate, Query effective, Query Safe, Accessible Gabapentin 100 mg 1 capsule twice daily - Appropriate, Query effective, Safe, Accessible -Medications previously tried: hydrocodone -Recommended trial of gabapentin only at night to see if this helps with sedation.  ADHD (Goal: remain focused) -Controlled -Current treatment  No medications -Medications previously tried: Adderall (no longer needed) -Recommended to continue current medication   Health Maintenance -Vaccine gaps: second dose of shingrix, COVID vaccine -Current therapy:  Vitamin E 1 tablet once a week Magnesium 500 mg daily Vitamin B complex 1 capsule daily Multivitamin 1 tablet daily Acyclovir 400 mg 1 tablet four times daily - patient only takes once daily -Educated on Cost vs benefit of each product must be carefully weighed by individual consumer -Patient is satisfied with current therapy and denies issues -Recommended to continue current medication  Patient Goals/Self-Care Activities Patient will:  - take medications as prescribed focus on medication adherence by filling out weekly pill box and marking calendar for doses taken target a minimum of 150 minutes of moderate intensity exercise weekly  Follow Up Plan: The care management team will reach out to the patient again over the next 30 days.          Medication Assistance: None required.  Patient affirms current  coverage meets needs.  Compliance/Adherence/Medication fill history: Care Gaps: COVID vaccine, shingrix (2nd dose), DEXA, influenza vaccine,  mammogram BP- 130/72 05/01/22    Star-[facility] Drugs: Rosuvastatin 40 mg - Last filled 03/24/22 90 DS at [facility]   Patient's preferred pharmacy is:  [facility], [place] [address] Phone: [phone] Fax: [place] [address] Phone: [phone] Fax: [phone]   Uses pill box? Yes Pt endorses 80% compliance - husband is filling the pillbox and they are using the calendar to mark the days she has taken her medications (filling pillbox on Mondays)  We discussed: Benefits of medication synchronization, packaging and delivery as well as enhanced pharmacist oversight with [employer]. Patient decided to: Continue current medication management strategy  Care Plan and Follow Up Patient Decision:  Patient agrees to Care Plan and Follow-up.  Plan: The care management team will reach out to the patient again over the next 30 days.  Jeni Salles, PharmD [facility] Clinical Pharmacist [place] at [place]

## 2022-05-24 NOTE — Patient Instructions (Signed)
Hi [name] [name],  It was great to catch up again! I am sorry you are having such difficulty with your sleep right now. I hope that the adjustments to your inspire help over the next few weeks. Let me know if the headaches become intolerable!  Please reach out to me if you have any questions or need anything before our follow up!  Best, [name]  Gaylord Shih, PharmD, [facility] Clinical Pharmacist [facility] [phone]   Visit Information   Goals Addressed   None    Patient Care Plan: CCM Pharmacy Care Plan     Problem Identified: Problem: Hypertension, Hyperlipidemia, GERD, Depression, Anxiety, Allergic Rhinitis and ADHD and back pain      Long-Range Goal: Patient-Specific Goal   Start Date: 09/21/2020  Expected End Date: 09/21/2021  Recent Progress: On track  Priority: High  Note:   Current Barriers:  Unable to independently monitor therapeutic efficacy Unable to self administer medications as prescribed  Pharmacist Clinical Goal(s):  Patient will achieve adherence to monitoring guidelines and medication adherence to achieve therapeutic efficacy achieve control of cholesterol as evidenced by next lipid panel  through collaboration with PharmD and provider.   Interventions: 1:1 collaboration with Nelwyn Salisbury, MD regarding development and update of comprehensive plan of care as evidenced by provider attestation and co-signature Inter-disciplinary care team collaboration (see longitudinal plan of care) Comprehensive medication review performed; medication list updated in electronic medical record  Hypertension (BP goal <130/80) -Controlled -Current treatment: No medications  -Medications previously tried: n/a  -Current home readings: 116/72 (brand new arm cuff - usually in the yellow 120-129 or blue is < 120) -Current dietary habits: been eating more fruit and vegetables and not eating out at all -Current exercise habits: would like to start walking and  recommended trying yoga on youtube to improve balance -Denies hypotensive/hypertensive symptoms -Educated on Daily salt intake goal < 2300 mg; Exercise goal of 150 minutes per week; -Counseled to monitor BP at home as directed, document, and provide log at future appointments -Counseled on diet and exercise extensively -Recommended bring her BP cuff to next office visit to ensure accuracy.  Hyperlipidemia/CAD: (LDL goal < 70) -Controlled -Current treatment: Rosuvastatin 40mg , 1 tablet once daily  - Appropriate, Query effective, Safe, Accessible Zetia 10 mg 1 tablet daily - Appropriate, Effective, Safe, Accessible Aspirin 81 mg 1 tablet daily - Appropriate, Effective, Safe, Accessible -Medications previously tried: none  -Current dietary patterns: doing much better with diet; not eating out at all and eating lots of fruits. Seafood and stir fry vegetables -Current exercise habits: no consistent exercise -Educated on Cholesterol goals;  Benefits of statin for ASCVD risk reduction; Importance of limiting foods high in cholesterol; Exercise goal of 150 minutes per week; -Counseled on diet and exercise extensively Recommended follow up on tolerances and headaches with Zetia in 1 month.  Depression/Anxiety/bipolar disorder (Goal: minimize symptoms) -Not ideally controlled -Current treatment: fluoxetine 40 mg, 1 tablet once daily - Appropriate, Effective, Safe, Accessible alprazolam 2mg , 1 tablet three times daily as needed (patient reports sometimes not taking once a day) - Appropriate, Effective, Query Safe, Accessible -Medications previously tried/failed: Seroquel, Lithium -PHQ9: 0 -GAD7: n/a -Connected with Health phone number [phone]) for mental health support -Educated on Benefits of medication for symptom control Benefits of cognitive-behavioral therapy with or without medication -Collaborated with CCM [facility] for assistance with finding  counselor.  Insomnia (Goal: improve quality and quantity of sleep) -Controlled -Current treatment  Inspire device  Hydroxyzine  50 mg 1 tablet at bedtime as needed - Appropriate, Effective, Safe, Accessible -Medications previously tried: Seroquel, Zzquil, melatonin -Counseled on worsening of sleep apnea with use of alprazolam.  Allergic rhinitis (Goal: minimize symptoms) -Controlled -Current treatment  azelastine (Astelin) 0.1% nasal spray, 2 sprays into both nostrils twice daily as directed - Appropriate, Effective, Safe, Accessible -Medications previously tried: none  -Recommended to continue current medication  Arthritis pain (Goal: minimize pain) -Controlled -Current treatment  Osteo Bi-flex (glucosamine condroitin & vitamin D) 2000 units daily - Appropriate, Effective, Safe, Accessible -Medications previously tried: none  -Counseled on giving the medicine a few weeks trial before deciding if it is helping; discussed stopping vitamin D supplementation with this as the replacement  GERD (Goal: minimize symptoms) -Controlled -Current treatment  Omeprazole 20 mg 1 capsule twice daily (once daily) - Appropriate, Effective, Safe, Accessible -Medications previously tried: none  -Recommended to continue current medication  Low potassium (Goal: 3.5-5) -Controlled -Current treatment  Potassium chloride 10 mEq 1 tablet daily - Appropriate, Effective, Safe, Accessible -Medications previously tried: none  -Recommended repeat potassium level  Low back pain (Goal: minimize pain) -Not ideally controlled -Current treatment  Tramadol 50 mg 1 tablet every 12 hours as needed - Appropriate, Query effective, Query Safe, Accessible Gabapentin 100 mg 1 capsule twice daily - Appropriate, Query effective, Safe, Accessible -Medications previously tried: hydrocodone -Recommended trial of gabapentin only at night to see if this helps with sedation.  ADHD (Goal: remain  focused) -Controlled -Current treatment  No medications -Medications previously tried: Adderall (no longer needed) -Recommended to continue current medication   Health Maintenance -Vaccine gaps: second dose of shingrix, COVID vaccine -Current therapy:  Vitamin E 1 tablet once a week Magnesium 500 mg daily Vitamin B complex 1 capsule daily Multivitamin 1 tablet daily Acyclovir 400 mg 1 tablet four times daily - patient only takes once daily -Educated on Cost vs benefit of each product must be carefully weighed by individual consumer -Patient is satisfied with current therapy and denies issues -Recommended to continue current medication  Patient Goals/Self-Care Activities Patient will:  - take medications as prescribed focus on medication adherence by filling out weekly pill box and marking calendar for doses taken target a minimum of 150 minutes of moderate intensity exercise weekly  Follow Up Plan: The care management team will reach out to the patient again over the next 30 days.         Patient verbalizes understanding of instructions and care plan provided today and agrees to view in [place]. Active MyChart status and patient understanding of how to access instructions and care plan via MyChart confirmed with patient.    The pharmacy team will reach out to the patient again over the next 30 days.   Viona Gilmore, [facility]

## 2022-05-28 DIAGNOSIS — R42 Dizziness and giddiness: Secondary | ICD-10-CM | POA: Diagnosis not present

## 2022-05-29 ENCOUNTER — Other Ambulatory Visit (HOSPITAL_COMMUNITY): Payer: Self-pay | Admitting: Neurosurgery

## 2022-05-29 ENCOUNTER — Telehealth: Payer: Self-pay | Admitting: Family Medicine

## 2022-05-29 DIAGNOSIS — M5440 Lumbago with sciatica, unspecified side: Secondary | ICD-10-CM

## 2022-05-29 NOTE — Telephone Encounter (Signed)
Pt Husband call and stated she need a call back to talk about her MRI that she couldn't get done.

## 2022-05-29 NOTE — Telephone Encounter (Signed)
Spoke with pt and Spouse regarding MRI for brain, advised that our referral coordinator has pt schedule for Sunday 06/03/22 at 3.45 pm at [facility]. Pt and spouse verbalized understanding

## 2022-06-03 ENCOUNTER — Ambulatory Visit (HOSPITAL_COMMUNITY)
Admission: RE | Admit: 2022-06-03 | Discharge: 2022-06-03 | Disposition: A | Discharge status: Home / Self Care | Payer: PPO | Source: Ambulatory Visit | Attending: Neurosurgery | Admitting: Neurosurgery

## 2022-06-03 ENCOUNTER — Ambulatory Visit (HOSPITAL_COMMUNITY)
Admission: RE | Admit: 2022-06-03 | Discharge: 2022-06-03 | Disposition: A | Discharge status: Home / Self Care | Payer: PPO | Source: Ambulatory Visit | Attending: Family Medicine | Admitting: Family Medicine

## 2022-06-03 DIAGNOSIS — M5440 Lumbago with sciatica, unspecified side: Secondary | ICD-10-CM | POA: Insufficient documentation

## 2022-06-03 DIAGNOSIS — M545 Low back pain, unspecified: Secondary | ICD-10-CM | POA: Diagnosis not present

## 2022-06-03 DIAGNOSIS — R42 Dizziness and giddiness: Secondary | ICD-10-CM | POA: Insufficient documentation

## 2022-06-03 MED ORDER — GADOBUTROL 1 MMOL/ML IV SOLN
8.0000 mL | Freq: Once | INTRAVENOUS | Status: AC | PRN
  Administered 2022-06-03: 8 mL via INTRAVENOUS

## 2022-06-05 ENCOUNTER — Ambulatory Visit: Payer: Self-pay | Admitting: Licensed Clinical Social Worker

## 2022-06-07 ENCOUNTER — Ambulatory Visit: Payer: PPO | Attending: Cardiology | Admitting: Cardiology

## 2022-06-07 ENCOUNTER — Encounter: Payer: Self-pay | Admitting: Cardiology

## 2022-06-07 VITALS — BP 126/78 | HR 72 | Ht 62.0 in | Wt 183.6 lb

## 2022-06-07 DIAGNOSIS — G4733 Obstructive sleep apnea (adult) (pediatric): Secondary | ICD-10-CM | POA: Diagnosis not present

## 2022-06-07 DIAGNOSIS — I10 Essential (primary) hypertension: Secondary | ICD-10-CM

## 2022-06-07 NOTE — Patient Instructions (Signed)
Medication Instructions:  Your physician recommends that you continue on your current medications as directed. Please refer to the Current Medication list given to you today.  *If you need a refill on your cardiac medications before your next appointment, please call your pharmacy*   Lab Work: None. If you have labs (blood work) drawn today and your tests are completely normal, you will receive your results only by: MyChart Message (if you have MyChart) OR A paper copy in the mail If you have any lab test that is abnormal or we need to change your treatment, we will call you to review the results.   Testing/Procedures: None.   Follow-Up: Jul 19, 2022 at 1000.  Provider:   Dr. Armanda Magic, MD    Important Information About Sugar

## 2022-06-07 NOTE — Progress Notes (Signed)
Date:  06/07/2022   ID:  [name] [name], DOB 1955-07-27, MRN [id number] The patient was identified using 2 identifiers.  PCP:  Nelwyn SalisburyFry, Stephen A, MD   [facility] Providers Cardiologist:  None     Evaluation Performed:  Follow-Up Visit  Chief Complaint:  OSA  History of Present Illness:    [name] [name] is a 66 y.o. female with a history of ADHD, bipolar disorder, hypertension, GERD, hyperlipidemia, obesity.  She has a hx of OSA on CPAP therapy.  Unfortunately she was intolerant to CPAP and requested ENT evaluation of  Inspire. Device.  She underwent hypoglossal nerve stimulator implant by Dr. Jenne PaneBates on 03/25/2019.    She was seen back on 05/01/2019 for device activation.  She was then seen in the office on 10/06/2019 and was complaining of feeling very sleepy and felt that her device need to be reprogrammed.  She was having a lot of daytime sleepiness as well as snoring.  Nerve stimulation was performed and her amplitude was increased from 1.7 to 2.5 V.  Her control range was changed to 2.3 to 3.3 V.  A few weeks later she had an in lab study done 04/30/2020 showed which showed no significant obstructive sleep apnea using her inspire device.    I then saw her back 07/31/2021 and she was doing great with her inspire device and was on level 5 which was 2.7 V.  She was feeling rested when she get up in the morning and had no daytime sleepiness.  She had no tongue sensitivity and her husband said she was very lightly snoring.  At that time an Itamar sleep study was ordered which showed moderate obstructive sleep apnea with an AHI of 17.9/h and O2 sats as low as 81%.  She is now brought back into the office for further evaluation due to an increase in her AHI.  At last office visit she was had really struggling with her inspire device and has not been able to sleep at night.  She was having a severe sore throat when she would wake up up in the morning and felt like her tongue was all the  way out of her mouth all night and very painful in the morning.  At that office visit  interrogation of her device was performed and showed that her thresholds had decreased to 1 V and it was likely that she had had a decrease in stimulation threshold over time due to healing and did not require a high pacing threshold any longer and was resulting in overstimulation of the hypoglossal nerve which was changing her anatomy  resulting in more apneas. Her parameters were changed with the stimulation range of 1.4 V to 1.8 V.  Her goal was to get to level 2 or 3 which would be 1.5 to 1.6 V.  She underwent Itamar sleep study 11/23/21 to make sure that her apneas were stable on current inspire settings.  This showed mild obstructive sleep apnea with an AHI 7.3/h  She was seen again 11.14.2023 due to recurrent snoring again even when she was on her side.  She had gained over 15 lbs since May.  She was on level 5 which is 1.8 volts.  She also had been more sleepy than usual.   She has been placed on Gabapentin that has caused weight gain. She also was just dx with ILD and emphysema. The gabapentin makes her sleepy as well.  At that OV her voltage was increased but  that resulted in significant protrusion of her tongue with angulation to the left.  She was kept at level 5 (1.8V).  She underwent Itamar HST which showed very mild OSA with an AHI of 9/hr and no central events.  She is now here for followup.   She is still having some issues with snoring but thinks it is gotten better.  She does have issues with insomnia at times but thinks that recently she has been sleeping better.  She is averaging 8.5 hours of sleep nightly and uses her device 100% of the time.  Past Medical History:  Diagnosis Date   Allergic rhinitis 01/23/2008   Angina pectoris 06/06/2016   Arthritis    Attention deficit hyperactivity disorder (ADHD) 02/07/2016   Back pain, lumbar 01/21/2009   Bell's palsy 02/06/2008   Bipolar disorder  12/13/2009   Chronic idiopathic constipation    Dizziness 04/16/2008   Edema, leg 01/21/2009   Epigastric pain    Essential hypertension 01/23/2008   Gastro-esophageal reflux disease without esophagitis 01/23/2008   Generalized anxiety disorder 01/23/2008   Headache 04/15/2007   Hidradenitis 10/04/2008   History of colonic polyps    Hyperlipidemia    Hypokalemia 07/23/2018   Insomnia 01/23/2008   Major depressive disorder 01/23/2008   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   Neck pain 05/06/2017   Obesity (BMI 30-39.9) 11/10/2015   OSA (obstructive sleep apnea) 04/15/2007   inspire implant; does not always remember to turn device on   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 Wynetta Emery   BREAST BIOPSY Right 1981   benign   BREAST EXCISIONAL BIOPSY Right 1981   benign   scar does not show   BREAST SURGERY Bilateral 12/11/2012   reductions per Dr. Ivin Booty in [facility]    CATARACT EXTRACTION, BILATERAL Bilateral last summer of 2018   at [facility]   COLONOSCOPY  03/07/2021   per Dr. Loreta Ave, adenomatous polyps, repeat in 7 yrs   DRUG INDUCED ENDOSCOPY N/A 01/30/2019   Procedure: DRUG INDUCED ENDOSCOPY;  Surgeon: Christia Reading, MD;  Location: [facility];  Service: ENT;  Laterality: N/A;   ESOPHAGOGASTRODUODENOSCOPY  04/23/2011   per Dr. Loreta Ave, normal    EYE SURGERY  06/18/2016   L eyelid    IMPLANTATION OF HYPOGLOSSAL NERVE STIMULATOR Right 03/25/2019   Procedure: IMPLANTATION OF HYPOGLOSSAL NERVE STIMULATOR;  Surgeon: Christia Reading, MD;  Location: [facility] OR;  Service: ENT;  Laterality: Right;   LUMBAR DISC SURGERY  2003   Dr. Wynetta Emery   MULTIPLE TOOTH EXTRACTIONS     ORIF WRIST FRACTURE Right  08/29/2018   REDUCTION MAMMAPLASTY Bilateral 2013   SPLIT NIGHT STUDY  11/11/2015   UVULOPALATOPHARYNGOPLASTY     VAGINAL HYSTERECTOMY  05/1995   secondary to fibroids.     Current Meds  Medication Sig   acyclovir (ZOVIRAX) 400 MG tablet TAKE 1 TABLET BY MOUTH 4 TIMES A DAY AS NEEDED   alprazolam (XANAX) 2 MG tablet TAKE 1 TABLET BY MOUTH 3 TIMES DAILY AS NEEDED FOR ANXIETY.   aspirin EC 81 MG tablet Take 81 mg by mouth daily. Swallow whole.   B Complex-C-Folic Acid (B-COMPLEX/VITAMIN C PO) Take  1 tablet by mouth daily.   Cholecalciferol (VITAMIN D3) 125 MCG (5000 UT) CAPS Take 5,000 Units by mouth once a week.   ezetimibe (ZETIA) 10 MG tablet Take 1 tablet (10 mg total) by mouth daily.   FLUoxetine (PROZAC) 40 MG capsule Take 1 capsule (40 mg total) by mouth daily.   gabapentin (NEURONTIN) 100 MG capsule TAKE 1 CAPSULE BY MOUTH 2 TIMES DAILY.   hydrocortisone (ANUSOL-HC) 2.5 % rectal cream Place 1 Application rectally 2 (two) times daily.   Magnesium 500 MG CAPS Take 500 mg by mouth daily.   omeprazole (PRILOSEC) 20 MG capsule TAKE 1 CAPSULE BY MOUTH 2 TIMES DAILY BEFORE A MEAL. (Patient taking differently: Take 20 mg by mouth daily.)   Polyethyl Glycol-Propyl Glycol (SYSTANE) 0.4-0.3 % GEL ophthalmic gel Place 1 drop into both eyes daily as needed.   polyethylene glycol (MIRALAX / GLYCOLAX) 17 g packet Use one packet daily   potassium chloride (KLOR-CON) 10 MEQ tablet Take 1 tablet (10 mEq total) by mouth daily.   rosuvastatin (CRESTOR) 40 MG tablet Take 40 mg by mouth daily.     Allergies:   Sulfonamide derivatives, Flexeril [cyclobenzaprine], Hydromorphone, Morphine and related, and Sulfamethoxazole   Social History   Tobacco Use   Smoking status: Former    Years: 30.00    Types: Cigarettes    Quit date: 12/2020    Years since quitting: 1.4   Smokeless tobacco: Never  Vaping Use   Vaping Use: Never used  Substance Use Topics   Alcohol use: Not Currently   Drug use: No      Family Hx: The patient's family history includes Alcohol abuse in an other family member; Arthritis in an other family member; Asthma in her father; Coronary artery disease in her brother, sister, and another family member; Dementia in her mother; Depression in an other family member; Diabetes in an other family member; Hyperlipidemia in her brother, mother, sister, sister, and another family member; Hypertension in her brother, sister, sister, and another family member; Sudden death in an other family member; Thyroid disease in her sister.  ROS:   Please see the history of present illness.     All other systems reviewed and are negative.   Prior CV studies:   The following studies were reviewed today:  Home sleep study  Labs/Other Tests and Data Reviewed:    EKG:  No ECG reviewed.  Recent Labs: 11/30/2021: B Natriuretic Peptide 26.3 12/05/2021: BUN 13; Creatinine, Ser 0.72; Hemoglobin 12.6; Platelets 183; Potassium 4.0; Sodium 141 12/20/2021: TSH 3.67 05/22/2022: ALT 25   Recent Lipid Panel Lab Results  Component Value Date/Time   CHOL 157 05/22/2022 10:55 AM   TRIG 135.0 05/22/2022 10:55 AM   HDL 58.30 05/22/2022 10:55 AM   CHOLHDL 3 05/22/2022 10:55 AM   LDLCALC 71 05/22/2022 10:55 AM   LDLCALC 124 (H) 03/17/2020 11:52 AM   LDLDIRECT 129.0 12/07/2021 11:14 AM    Wt Readings from Last 3 Encounters:  06/07/22 183 lb 9.6 oz (83.3 kg)  05/01/22 180 lb 6.4 oz (81.8 kg)  04/03/22 185 lb (83.9 kg)     Risk Assessment/Calculations:      STOP-Bang Score:  3      Objective:    Vital Signs:  BP 126/78   Pulse 72   Ht 5\' 2"  (1.575 m)   Wt 183 lb 9.6 oz (83.3 kg)   SpO2 99%   BMI 33.58 kg/m   Well nourished, well developed female in no  acute distress. Well appearing, alert and conversant, regular work of breathing,  good skin color  Eyes- anicteric mouth- oral mucosa is pink  neuro- grossly intact skin- no apparent rash or lesions or cyanosis ASSESSMENT & PLAN:     OSA -intolerant to CPAP therapy -s/p hypoglossal nerve stimulator -As stated above her stimulation range was increased to 2.3-3.3V at office visit 09/2019 with repeat sleep study in November 2021 showing no residual sleep apnea -Itamar sleep study was done 08/08/2021 for follow-up which showed moderate obstructive sleep apnea with an AHI of 17.9/h -At last OV she was having a lot of difficulty with sore throat in the morning as well as feeling like her tongue is out of her mouth the hallway all night and has not been sleeping well at all -Her thresholds had decreased to 1 V and it was felt that she had had a decrease in stimulation threshold over time due to healing and did not require a high pacing threshold any longef resulting in overstimulation of the hypoglossal nerve which is changing her anatomy and was resulting in more apneas. -Her parameters were changed with the stimulation range of 1.4 V to 1.8 V with a  goal  to get to level 2 or 3 which would be 1.5 to 1.6 V. -She is doing great on the current settings and is on level 5 with complete resolution of her mouth dryness and sore throat and has no daytime sleepiness -A Itamar home sleep study in June 2023 showed residual mild obstructive sleep apnea on level 5 at 1.8V with an AHI of 7.3/h but down from 17/hour at her baseline study -OV  05/01/2022 she had increased snoring on her side and had gained 15lbs since May and suspect that she is having more apneas>>increased voltage was tried in office but had significant protrusion with angulation to the left >> kept at  level 5 at 1.8V  -Repeat HST 05/13/2022 with AHI 9/hr -Office visit today 06/07/2022 her device was interrogated.  She is currently at level 5 which is 1.8 V and testing at this voltage shows prominent left-sided tongue deviation.  Suspect that this may be distorting anatomy and causing her neck to actually have more apneas and resulting in more snoring.  Her device was reprogrammed  after testing her tongue motion at multiple levels.  1.4 V showed good tongue motion.  Her device was reprogrammed with a range of 1.2 to 1.6 V.  Her optimal level will be level 3 which is 1.4 V. -Will do a 4-week check-in to see how she is doing on 1.4 V.  If she is not having any excessive daytime sleepiness and snoring is improved we will continue on that setting with no further testing  2.  Hypertension -BP is controlled at home -She is not on any antihypertensive therapy     Time:   Today, I have spent 30 minutes with the patient with telehealth technology discussing the above problems.     Medication Adjustments/Labs and Tests Ordered: Current medicines are reviewed at length with the patient today.  Concerns regarding medicines are outlined above.   Tests Ordered: No orders of the defined types were placed in this encounter.   Medication Changes: No orders of the defined types were placed in this encounter.   Follow Up:  Followup with me after sleep study  Signed, Armanda Magic, MD  06/07/2022 11:50 AM    [facility]

## 2022-06-08 ENCOUNTER — Ambulatory Visit: Payer: PPO | Admitting: Acute Care

## 2022-06-08 ENCOUNTER — Ambulatory Visit (INDEPENDENT_AMBULATORY_CARE_PROVIDER_SITE_OTHER): Payer: PPO | Admitting: Pulmonary Disease

## 2022-06-08 DIAGNOSIS — J9611 Chronic respiratory failure with hypoxia: Secondary | ICD-10-CM | POA: Diagnosis not present

## 2022-06-08 LAB — PULMONARY FUNCTION TEST
DL/VA % pred: 76 %
DL/VA: 3.24 ml/min/mmHg/L
DLCO cor % pred: 74 %
DLCO cor: 13.73 ml/min/mmHg
DLCO unc % pred: 74 %
DLCO unc: 13.73 ml/min/mmHg
FEF 25-75 Post: 3.28 L/sec
FEF 25-75 Pre: 2.71 L/sec
FEF2575-%Change-Post: 21 %
FEF2575-%Pred-Post: 166 %
FEF2575-%Pred-Pre: 137 %
FEV1-%Change-Post: 5 %
FEV1-%Pred-Post: 106 %
FEV1-%Pred-Pre: 100 %
FEV1-Post: 2.34 L
FEV1-Pre: 2.21 L
FEV1FVC-%Change-Post: 3 %
FEV1FVC-%Pred-Pre: 109 %
FEV6-%Change-Post: 2 %
FEV6-%Pred-Post: 97 %
FEV6-%Pred-Pre: 95 %
FEV6-Post: 2.7 L
FEV6-Pre: 2.63 L
FEV6FVC-%Pred-Post: 104 %
FEV6FVC-%Pred-Pre: 104 %
FVC-%Change-Post: 2 %
FVC-%Pred-Post: 93 %
FVC-%Pred-Pre: 91 %
FVC-Post: 2.7 L
FVC-Pre: 2.63 L
Post FEV1/FVC ratio: 87 %
Post FEV6/FVC ratio: 100 %
Pre FEV1/FVC ratio: 84 %
Pre FEV6/FVC Ratio: 100 %
RV % pred: 53 %
RV: 1.07 L
TLC % pred: 99 %
TLC: 4.72 L

## 2022-06-08 NOTE — Patient Instructions (Signed)
Visit Information  Thank you for taking time to visit with me today. Please don't hesitate to contact me if I can be of assistance to you.   Following are the goals we discussed today:   Goals Addressed             This Visit's Progress    Management of MH Conditions   On track    Care Coordination Interventions: Solution-Focused Strategies employed:  Mindfulness or Relaxation training provided Active listening / Reflection utilized  Emotional Support Provided Provided psychoeducation for mental health needs  Caregiver stress acknowledged  Verbalization of feelings encouraged  Patient reports that she read her scan results on MyChart. Patient is considering taking additional medication to assist with pain and mobility LCSW created safe space for processing emotions Per pt, she continues Vestibular therapy for her balance concerns; however, she has had to reduce repetitions to reduce injury Patient continues to use cane, staff, and spouse to ambulate safely Pt reports having "great" family support LCSW discussed strategies to assist with coping with grief during holidays. Daughters send positive affirmations and patient identified additional healthy coping skills Validation and encouragement provided LCSW reviewed upcoming appts. Spouse continues to provide transportation             Our next appointment is by telephone on 07/03/22 at 11 AM  Please call the care guide team at [phone] if you need to cancel or reschedule your appointment.   If you are experiencing a Mental Health or Behavioral Health Crisis or need someone to talk to, please call the Suicide and Crisis Lifeline: 988 call 911   Patient verbalizes understanding of instructions and care plan provided today and agrees to view in MyChart. Active MyChart status and patient understanding of how to access instructions and care plan via MyChart confirmed with patient.     [name], MSW, LCSW [facility] Care  Management [place]  [facility] [email or web address] Phone [phone] 7:11 AM

## 2022-06-08 NOTE — Patient Instructions (Signed)
Full PFT performed today. °

## 2022-06-08 NOTE — Progress Notes (Signed)
Full PFT performed today. °

## 2022-06-08 NOTE — Patient Outreach (Signed)
  Care Coordination Late Entry  Follow Up Visit Note   Outreach completed on 06/05/22 Name: [name] [name] MRN: [id number] DOB: [birth year]  [name] [name] is a 66 y.o. year old female who sees Nelwyn Salisbury, MD for primary care. I spoke with  [name] [name] by phone today.  What matters to the patients health and wellness today?  Coping Skills/Grief    Goals Addressed             This Visit's Progress    Management of MH Conditions   On track    Care Coordination Interventions: Solution-Focused Strategies employed:  Mindfulness or Relaxation training provided Active listening / Reflection utilized  Emotional Support Provided Provided psychoeducation for mental health needs  Caregiver stress acknowledged  Verbalization of feelings encouraged  Patient reports that she read her scan results on MyChart. Patient is considering taking additional medication to assist with pain and mobility LCSW created safe space for processing emotions Per pt, she continues Vestibular therapy for her balance concerns; however, she has had to reduce repetitions to reduce injury Patient continues to use cane, staff, and spouse to ambulate safely Pt reports having "great" family support LCSW discussed strategies to assist with coping with grief during holidays. Daughters send positive affirmations and patient identified additional healthy coping skills Validation and encouragement provided LCSW reviewed upcoming appts. Spouse continues to provide transportation             SDOH assessments and interventions completed:  No     Care Coordination Interventions:  Yes, provided   Follow up plan: Follow up call scheduled for 4-6 weeks    Encounter Outcome:  Pt. Visit Completed   [name], MSW, LCSW [facility]  [facility] [email or web address] Phone [phone] 7:10 AM

## 2022-06-12 ENCOUNTER — Other Ambulatory Visit: Payer: Self-pay | Admitting: Family Medicine

## 2022-06-17 DIAGNOSIS — I10 Essential (primary) hypertension: Secondary | ICD-10-CM

## 2022-06-17 DIAGNOSIS — E785 Hyperlipidemia, unspecified: Secondary | ICD-10-CM

## 2022-06-19 ENCOUNTER — Ambulatory Visit: Payer: PPO | Admitting: Acute Care

## 2022-06-21 ENCOUNTER — Telehealth: Payer: Self-pay | Admitting: Pulmonary Disease

## 2022-06-21 NOTE — Telephone Encounter (Signed)
Called and spoke with the pt to offer sooner appt than 06/25/22  She states that she feels much better since the time of her call, and she feels that she can wait until planned appt 06/25/22  She is aware to call back sooner if needed or seek emergent care if symptoms worsen again  Nothing further needed

## 2022-06-21 NOTE — Telephone Encounter (Signed)
PT has an appt w/Dr. Elsworth Soho on 1/8 but wonders if she can get some meds before the visit to help ease lung/back pain and low grade fever,  135/79 is Blood Pressure.94 O2 reading   Right now they are using Primatine Mist sparingly but seek more immediate relief or advise from nurse. Please call her or husband at [phone]  Pharm: [facility]

## 2022-06-25 ENCOUNTER — Encounter (HOSPITAL_BASED_OUTPATIENT_CLINIC_OR_DEPARTMENT_OTHER): Payer: Self-pay | Admitting: Pulmonary Disease

## 2022-06-25 ENCOUNTER — Telehealth: Payer: Self-pay | Admitting: Pharmacist

## 2022-06-25 ENCOUNTER — Ambulatory Visit (INDEPENDENT_AMBULATORY_CARE_PROVIDER_SITE_OTHER): Payer: PPO | Admitting: Pulmonary Disease

## 2022-06-25 VITALS — BP 124/68 | HR 68 | Temp 97.8°F | Ht 62.0 in | Wt 191.6 lb

## 2022-06-25 DIAGNOSIS — J849 Interstitial pulmonary disease, unspecified: Secondary | ICD-10-CM | POA: Diagnosis not present

## 2022-06-25 DIAGNOSIS — J432 Centrilobular emphysema: Secondary | ICD-10-CM

## 2022-06-25 DIAGNOSIS — R911 Solitary pulmonary nodule: Secondary | ICD-10-CM | POA: Diagnosis not present

## 2022-06-25 DIAGNOSIS — J9611 Chronic respiratory failure with hypoxia: Secondary | ICD-10-CM | POA: Diagnosis not present

## 2022-06-25 MED ORDER — ALBUTEROL SULFATE (2.5 MG/3ML) 0.083% IN NEBU: 2.5000 mg | INHALATION_SOLUTION | Freq: Four times a day (QID) | RESPIRATORY_TRACT | 12 refills | Status: DC | PRN

## 2022-06-25 NOTE — Progress Notes (Signed)
   Subjective:    Patient ID: [name] [name], female    DOB: 24-Nov-1955, 67 y.o.   MRN: [id number]  HPI 67 year old smoker for follow-up of emphysema, ILD and pulmonary nodule  Appears to date back to 05/2016 on CT abdomen She smoked for about 15 pack years, a pack would last for 3 days until she quit in July 2022.  Serology in the remote past in 2008 was negative ANA, RA factor, ACE level was 87   PMH - ADHD, bipolar disorder, hypertension -hypoglossal nerve stimulator implant by Dr. Redmond Baseman on 03/25/2019.   -Currently set between 1.4 and 1.8 V ,level 5 equals 1.8 V  Chief Complaint  Patient presents with   Follow-up    Pt states she has no energy at all. Pt is dizzy sometimes, back problems, and some depression. Pt is having SOB with exertion.    37-month follow-up visit.  She arrives accompanied by her husband She complains of intermittent severe back pain, we reviewed HRCT and PFTs today.  She states that she felt much improved after nebulizer treatment and requested treatment for the same.  She has quit smoking but admits to vaping She appears somewhat confused today but denies taking any drugs, she is on gabapentin, Xanax and tramadol     Significant tests/ events reviewed   PFTs 05/2022 no airway obstruction, ratio 84, FEV1 100%, FVC 91%, TLC 99%, DLCO 13.73/74% HRCT 01/2022 >> emphysema, probable UIP, no vomiting, dates back to 05/2016, unchanged left upper lobe nodule   CT chest 05/2021 showed emphysema, 13 mm left upper lobe nodule peripheral and bibasilar and peripheral reticular markings, 11 mm right paratracheal lymph node   CT chest with contrast 09/2021 -showed unchanged left upper lobe nodule and similar changes of ILD.   CT angiogram chest 11/2021 showed right paratracheal lymph node 1.3 cm, unchanged nodule and similar peripheral reticular opacities   04/2020 Inspire titration  07/2021 HST showed AHI of 18/hour on 2.7 V?  Overstimulation HST 11/2021 (on Inspire)  mild, AHI 7/h ,low desat 86% on 1.8 V  Review of Systems neg for any significant sore throat, dysphagia, itching, sneezing, nasal congestion or excess/ purulent secretions, fever, chills, sweats, unintended wt loss, pleuritic or exertional cp, hempoptysis, orthopnea pnd or change in chronic leg swelling. Also denies presyncope, palpitations, heartburn, abdominal pain, nausea, vomiting, diarrhea or change in bowel or urinary habits, dysuria,hematuria, rash, arthralgias, visual complaints, headache, numbness weakness or ataxia.     Objective:   Physical Exam  Gen. Pleasant, obese, in no distress ENT - no lesions, no post nasal drip Neck: No JVD, no thyromegaly, no carotid bruits Lungs: no use of accessory muscles, no dullness to percussion, decreased without rales or rhonchi  Cardiovascular: Rhythm regular, heart sounds  normal, no murmurs or gallops, no peripheral edema Musculoskeletal: No deformities, no cyanosis or clubbing , no tremors       Assessment & Plan:    PFTs appear relatively preserved, lung volumes are preserved without evidence of airway obstruction so it is unlikely that this is related to emphysema.  Difficult to interpret slight low DLCO whether this is related emphysema or ILD.  ILD appears slightly progressed compared to 2017 but stable compared to more recent imaging.  She had subjective benefit from albuterol so we will try this

## 2022-06-25 NOTE — Progress Notes (Signed)
Care Coordination Pharmacy Assistant   Name: [name] [name]  MRN: [id number] DOB: [birth year]  Reason for Encounter: Disease State   Conditions to be addressed/monitored: Headaches per MP  Recent office visits:  None  Recent consult visits:   07/04/22 [name], MD (Cardiology) - Patient presented for Obstructive sleep apnea and other concerns. No medication changes.  07/03/22 [name], LCSW  - Patient presented for [facility] Care Coordination Social Worker Visit. No medication changes.   06/30/22 Patient presented to [facility] for CT Chest High Resolution.  06/26/22 [name] MD, [name] (Neurosurgery) - Patient presented for back pain. No medication changes.   06/25/22 [name], MD - Patient presented for ILD and other concerns. Prescribed Albuterol. Stopped Zetia. Stopped Gabapentin. Stopped Systane. Stopped Ultram.  06/08/22 [name], MD - Patient presented for Chronic respiratory failure with hypoxia and other concerns. No medication changes.  06/07/22 [name], MD (Cardiology) - Patient presented for Obstructive sleep apnea syndrome and other concerns.   06/05/22 [name], LCSW  - Patient presented for [facility] Care Coordination via phone. No medication changes.   Hospital visits:  Patient presented to [facility] on 07/04/22 due to chest pain patient was present for 22 hours. No medications were changed.  Medications: Outpatient Encounter Medications as of 06/25/2022  Medication Sig   acyclovir (ZOVIRAX) 400 MG tablet TAKE 1 TABLET BY MOUTH 4 TIMES A DAY AS NEEDED   alprazolam (XANAX) 2 MG tablet TAKE 1 TABLET BY MOUTH 3 TIMES DAILY AS NEEDED FOR ANXIETY.   aspirin EC 81 MG tablet Take 81 mg by mouth daily. Swallow whole.   B Complex-C-Folic Acid (B-COMPLEX/VITAMIN C PO) Take 1 tablet by mouth daily.   Cholecalciferol (VITAMIN D3) 125 MCG (5000 UT) CAPS Take 5,000 Units by mouth once a week.   ezetimibe (ZETIA) 10 MG  tablet Take 1 tablet (10 mg total) by mouth daily.   FLUoxetine (PROZAC) 40 MG capsule TAKE 1 CAPSULE (40 MG TOTAL) BY MOUTH DAILY.   gabapentin (NEURONTIN) 100 MG capsule TAKE 1 CAPSULE BY MOUTH 2 TIMES DAILY.   hydrocortisone (ANUSOL-HC) 2.5 % rectal cream Place 1 Application rectally 2 (two) times daily.   Magnesium 500 MG CAPS Take 500 mg by mouth daily.   omeprazole (PRILOSEC) 20 MG capsule TAKE 1 CAPSULE BY MOUTH 2 TIMES DAILY BEFORE A MEAL. (Patient taking differently: Take 20 mg by mouth daily.)   Polyethyl Glycol-Propyl Glycol (SYSTANE) 0.4-0.3 % GEL ophthalmic gel Place 1 drop into both eyes daily as needed.   polyethylene glycol (MIRALAX / GLYCOLAX) 17 g packet Use one packet daily   potassium chloride (KLOR-CON) 10 MEQ tablet TAKE 1 TABLET (10 MEQ TOTAL) BY MOUTH DAILY.   rosuvastatin (CRESTOR) 40 MG tablet Take 40 mg by mouth daily.   traMADol (ULTRAM) 50 MG tablet TAKE 1 TABLET BY MOUTH EVERY 12 HOURS AS NEEDED FOR SEVERE PAIN. (Patient not taking: Reported on 06/07/2022)   No facility-administered encounter medications on file as of 06/25/2022.   Notes: Per MP Call to patient to see if her sleep and headaches are improved, Patient reports she is doing much better and feels like they have improved. Patient states she will call when she is ready to get scheduled with the pharmacist as she has no medication needs at this time.   Care Gaps: COVID Booster - Overdue Flu Vaccine - Overdue AWV- 07/26/21 Mammogram - Postponed DEXA - Postponed Zoster - Postponed  Star Rating Drugs: Rosuvastatin 40 mg - Last filled 03/24/22 90 DS at [place] Pharmacist Assistant [phone]

## 2022-06-25 NOTE — Patient Instructions (Signed)
X Rx for nebuliser  Albuterol nebs every 6h as needed #30  X trial of trelegy 100 - sample take once daily- rinse mouth after use  X high res CT chest in  45months  You have to QUIT smoking !!!

## 2022-06-26 ENCOUNTER — Telehealth: Payer: Self-pay | Admitting: Pulmonary Disease

## 2022-06-26 DIAGNOSIS — J432 Centrilobular emphysema: Secondary | ICD-10-CM | POA: Insufficient documentation

## 2022-06-26 DIAGNOSIS — M5440 Lumbago with sciatica, unspecified side: Secondary | ICD-10-CM | POA: Diagnosis not present

## 2022-06-26 NOTE — Assessment & Plan Note (Signed)
She is not a great candidate for antifibrotic's but we will have to look for definitive signs of disease progression before we start Repeat high-resolution CT chest in 3 months

## 2022-06-26 NOTE — Assessment & Plan Note (Signed)
Appears stable and likely benign. Follow-up CT chest in 3 months

## 2022-06-26 NOTE — Telephone Encounter (Signed)
Called Pt to ask where she wanted medication sent to. Pt stated that she would like it sent to [place] because of insurance purposes. Meds sent. Nothing further needed.

## 2022-06-26 NOTE — Telephone Encounter (Signed)
John would like to know where nebulizer prescription was sent. According to AVS, everything was sent to [facility] but according to Jenny Reichmann they are unable to [facility] if that is where it was sent. Can the prescription be sent to [facility]? Please advise.

## 2022-06-26 NOTE — Assessment & Plan Note (Signed)
X Rx for nebuliser  Albuterol nebs every 6h as needed #30  X trial of trelegy 100 - sample take once daily- rinse mouth after use

## 2022-06-27 ENCOUNTER — Telehealth: Payer: Self-pay | Admitting: Pulmonary Disease

## 2022-06-27 LAB — ANA+ENA+DNA/DS+SCL 70+SJOSSA/B
ANA Titer 1: NEGATIVE
ENA RNP Ab: 1 AI — ABNORMAL HIGH (ref 0.0–0.9)
ENA SM Ab Ser-aCnc: <0.2 AI (ref 0.0–0.9)
ENA SSA (RO) Ab: <0.2 AI (ref 0.0–0.9)
ENA SSB (LA) Ab: <0.2 AI (ref 0.0–0.9)
Scleroderma (Scl-70) (ENA) Antibody, IgG: <0.2 AI (ref 0.0–0.9)
dsDNA Ab: <1 IU/mL (ref 0–9)

## 2022-06-27 NOTE — Telephone Encounter (Signed)
Gregory patient. PA team please advise. Thanks!

## 2022-06-27 NOTE — Telephone Encounter (Signed)
Prior Auth Request needed By [employer].  Fax [phone]  When it is sent over please include a summary notes as to why she needs the nebulizer

## 2022-06-28 DIAGNOSIS — J9611 Chronic respiratory failure with hypoxia: Secondary | ICD-10-CM | POA: Diagnosis not present

## 2022-06-28 NOTE — Telephone Encounter (Signed)
We are not able to do PA's for medical equipment such as nebulizers, these are typically covered under the patients Part B.

## 2022-06-30 ENCOUNTER — Ambulatory Visit (HOSPITAL_BASED_OUTPATIENT_CLINIC_OR_DEPARTMENT_OTHER)
Admission: RE | Admit: 2022-06-30 | Discharge: 2022-06-30 | Disposition: A | Discharge status: Home / Self Care | Payer: PPO | Source: Ambulatory Visit | Attending: Pulmonary Disease | Admitting: Pulmonary Disease

## 2022-06-30 DIAGNOSIS — J9611 Chronic respiratory failure with hypoxia: Secondary | ICD-10-CM | POA: Insufficient documentation

## 2022-06-30 DIAGNOSIS — R918 Other nonspecific abnormal finding of lung field: Secondary | ICD-10-CM | POA: Diagnosis not present

## 2022-06-30 DIAGNOSIS — J849 Interstitial pulmonary disease, unspecified: Secondary | ICD-10-CM | POA: Diagnosis not present

## 2022-07-02 ENCOUNTER — Telehealth: Payer: Self-pay | Admitting: Pulmonary Disease

## 2022-07-02 ENCOUNTER — Other Ambulatory Visit: Payer: Self-pay

## 2022-07-02 ENCOUNTER — Other Ambulatory Visit: Payer: Self-pay | Admitting: Family Medicine

## 2022-07-02 MED ORDER — POTASSIUM CHLORIDE ER 10 MEQ PO TBCR: 10.0000 meq | EXTENDED_RELEASE_TABLET | Freq: Every day | ORAL | 0 refills | Status: DC

## 2022-07-02 NOTE — Telephone Encounter (Signed)
Last refill-sent in by historical provider Last lipid labs-05/22/22 Last OV-04/03/22  No future OV scheduled.

## 2022-07-02 NOTE — Telephone Encounter (Signed)
Office notes sent to [facility] for nebulizer need.

## 2022-07-02 NOTE — Telephone Encounter (Signed)
Called [name] [name] and told him I will fax order to [facility].  Order faxed.  Nothing further needed.

## 2022-07-02 NOTE — Telephone Encounter (Signed)
[facility], is there any way the order that was placed 1/8 can be sent to [facility] for pt instead of [facility] where it was originally sent.

## 2022-07-03 ENCOUNTER — Telehealth (HOSPITAL_BASED_OUTPATIENT_CLINIC_OR_DEPARTMENT_OTHER): Payer: Self-pay

## 2022-07-03 ENCOUNTER — Ambulatory Visit: Payer: Self-pay | Admitting: Licensed Clinical Social Worker

## 2022-07-03 ENCOUNTER — Telehealth: Payer: Self-pay | Admitting: Pulmonary Disease

## 2022-07-03 NOTE — Telephone Encounter (Signed)
LMOM for Pt to return call for the results of her High Res Ct Scan.

## 2022-07-03 NOTE — Telephone Encounter (Signed)
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 

## 2022-07-03 NOTE — Telephone Encounter (Signed)
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

## 2022-07-03 NOTE — Telephone Encounter (Signed)
Spoke wit Pt's husband about her high Res Ct Scan. Pt and her husband stated understanding and nothing further needed at this time.

## 2022-07-03 NOTE — Telephone Encounter (Signed)
Patient is returning a call regarding her CT results.  Please call patient back at [phone]

## 2022-07-03 NOTE — Telephone Encounter (Signed)
Spoke with Pt and her husband and gave the results of High Res Ct scan. Pt stated understanding and nothing further needed at this time.

## 2022-07-04 ENCOUNTER — Encounter: Payer: Self-pay | Admitting: Cardiology

## 2022-07-04 ENCOUNTER — Telehealth: Payer: Self-pay | Admitting: Pulmonary Disease

## 2022-07-04 ENCOUNTER — Emergency Department (HOSPITAL_COMMUNITY): Payer: PPO

## 2022-07-04 ENCOUNTER — Observation Stay (HOSPITAL_COMMUNITY)
Admission: EM | Admit: 2022-07-04 | Discharge: 2022-07-05 | Disposition: A | Discharge status: Home / Self Care | Payer: PPO | Attending: Student | Admitting: Student

## 2022-07-04 ENCOUNTER — Ambulatory Visit: Payer: PPO | Attending: Cardiology | Admitting: Cardiology

## 2022-07-04 ENCOUNTER — Other Ambulatory Visit: Payer: Self-pay

## 2022-07-04 ENCOUNTER — Encounter (HOSPITAL_COMMUNITY): Payer: Self-pay | Admitting: Family Medicine

## 2022-07-04 VITALS — BP 110/62 | HR 80 | Ht 62.0 in | Wt 187.8 lb

## 2022-07-04 DIAGNOSIS — R079 Chest pain, unspecified: Secondary | ICD-10-CM

## 2022-07-04 DIAGNOSIS — I200 Unstable angina: Secondary | ICD-10-CM | POA: Insufficient documentation

## 2022-07-04 DIAGNOSIS — J432 Centrilobular emphysema: Secondary | ICD-10-CM | POA: Diagnosis present

## 2022-07-04 DIAGNOSIS — D649 Anemia, unspecified: Secondary | ICD-10-CM | POA: Diagnosis not present

## 2022-07-04 DIAGNOSIS — F411 Generalized anxiety disorder: Secondary | ICD-10-CM | POA: Diagnosis not present

## 2022-07-04 DIAGNOSIS — G4733 Obstructive sleep apnea (adult) (pediatric): Secondary | ICD-10-CM | POA: Diagnosis not present

## 2022-07-04 DIAGNOSIS — E669 Obesity, unspecified: Secondary | ICD-10-CM | POA: Diagnosis present

## 2022-07-04 DIAGNOSIS — Z90710 Acquired absence of both cervix and uterus: Secondary | ICD-10-CM | POA: Diagnosis not present

## 2022-07-04 DIAGNOSIS — R072 Precordial pain: Principal | ICD-10-CM | POA: Diagnosis present

## 2022-07-04 DIAGNOSIS — J849 Interstitial pulmonary disease, unspecified: Secondary | ICD-10-CM | POA: Diagnosis present

## 2022-07-04 DIAGNOSIS — Z7982 Long term (current) use of aspirin: Secondary | ICD-10-CM | POA: Diagnosis not present

## 2022-07-04 DIAGNOSIS — Z87891 Personal history of nicotine dependence: Secondary | ICD-10-CM | POA: Diagnosis not present

## 2022-07-04 DIAGNOSIS — I10 Essential (primary) hypertension: Secondary | ICD-10-CM

## 2022-07-04 DIAGNOSIS — I2510 Atherosclerotic heart disease of native coronary artery without angina pectoris: Secondary | ICD-10-CM | POA: Diagnosis not present

## 2022-07-04 DIAGNOSIS — R0789 Other chest pain: Secondary | ICD-10-CM | POA: Diagnosis not present

## 2022-07-04 DIAGNOSIS — Z79899 Other long term (current) drug therapy: Secondary | ICD-10-CM | POA: Insufficient documentation

## 2022-07-04 DIAGNOSIS — G9340 Encephalopathy, unspecified: Secondary | ICD-10-CM | POA: Insufficient documentation

## 2022-07-04 DIAGNOSIS — I700 Atherosclerosis of aorta: Secondary | ICD-10-CM | POA: Insufficient documentation

## 2022-07-04 DIAGNOSIS — J439 Emphysema, unspecified: Secondary | ICD-10-CM | POA: Diagnosis not present

## 2022-07-04 DIAGNOSIS — I708 Atherosclerosis of other arteries: Secondary | ICD-10-CM | POA: Insufficient documentation

## 2022-07-04 DIAGNOSIS — K551 Chronic vascular disorders of intestine: Secondary | ICD-10-CM | POA: Diagnosis not present

## 2022-07-04 DIAGNOSIS — R0602 Shortness of breath: Secondary | ICD-10-CM | POA: Diagnosis not present

## 2022-07-04 LAB — ECHOCARDIOGRAM COMPLETE
Area-P 1/2: 3.21 cm2
Height: 62 in
S' Lateral: 2.4 cm
Weight: 3004.8 oz

## 2022-07-04 LAB — I-STAT VENOUS BLOOD GAS, ED
Acid-Base Excess: 2 mmol/L (ref 0.0–2.0)
Bicarbonate: 27.2 mmol/L (ref 20.0–28.0)
Calcium, Ion: 1.13 mmol/L — ABNORMAL LOW (ref 1.15–1.40)
HCT: 35 % — ABNORMAL LOW (ref 36.0–46.0)
Hemoglobin: 11.9 g/dL — ABNORMAL LOW (ref 12.0–15.0)
O2 Saturation: 100 %
Potassium: 3.6 mmol/L (ref 3.5–5.1)
Sodium: 138 mmol/L (ref 135–145)
TCO2: 29 mmol/L (ref 22–32)
pCO2, Ven: 44.9 mmHg (ref 44–60)
pH, Ven: 7.39 (ref 7.25–7.43)
pO2, Ven: 205 mmHg — ABNORMAL HIGH (ref 32–45)

## 2022-07-04 LAB — COMPREHENSIVE METABOLIC PANEL
ALT: 22 U/L (ref 0–44)
AST: 33 U/L (ref 15–41)
Albumin: 3.8 g/dL (ref 3.5–5.0)
Alkaline Phosphatase: 48 U/L (ref 38–126)
Anion gap: 9 (ref 5–15)
BUN: 24 mg/dL — ABNORMAL HIGH (ref 8–23)
CO2: 26 mmol/L (ref 22–32)
Calcium: 8.9 mg/dL (ref 8.9–10.3)
Chloride: 102 mmol/L (ref 98–111)
Creatinine, Ser: 0.75 mg/dL (ref 0.44–1.00)
GFR, Estimated: >60 mL/min (ref >60)
Glucose, Bld: 98 mg/dL (ref 70–99)
Potassium: 3.7 mmol/L (ref 3.5–5.1)
Sodium: 137 mmol/L (ref 135–145)
Total Bilirubin: 0.5 mg/dL (ref 0.3–1.2)
Total Protein: 7 g/dL (ref 6.5–8.1)

## 2022-07-04 LAB — CBC WITH DIFFERENTIAL/PLATELET
Abs Immature Granulocytes: 0.01 10*3/uL (ref 0.00–0.07)
Basophils Absolute: 0 10*3/uL (ref 0.0–0.1)
Basophils Relative: 1 %
Eosinophils Absolute: 0.2 10*3/uL (ref 0.0–0.5)
Eosinophils Relative: 2 %
HCT: 35.6 % — ABNORMAL LOW (ref 36.0–46.0)
Hemoglobin: 11.6 g/dL — ABNORMAL LOW (ref 12.0–15.0)
Immature Granulocytes: 0 %
Lymphocytes Relative: 22 %
Lymphs Abs: 1.7 10*3/uL (ref 0.7–4.0)
MCH: 30.3 pg (ref 26.0–34.0)
MCHC: 32.6 g/dL (ref 30.0–36.0)
MCV: 93 fL (ref 80.0–100.0)
Monocytes Absolute: 0.5 10*3/uL (ref 0.1–1.0)
Monocytes Relative: 7 %
Neutro Abs: 5.2 10*3/uL (ref 1.7–7.7)
Neutrophils Relative %: 68 %
Platelets: 158 10*3/uL (ref 150–400)
RBC: 3.83 MIL/uL — ABNORMAL LOW (ref 3.87–5.11)
RDW: 14.6 % (ref 11.5–15.5)
WBC: 7.7 10*3/uL (ref 4.0–10.5)
nRBC: 0 % (ref 0.0–0.2)

## 2022-07-04 LAB — TROPONIN I (HIGH SENSITIVITY)
Troponin I (High Sensitivity): <2 ng/L (ref <18)
Troponin I (High Sensitivity): <2 ng/L (ref <18)

## 2022-07-04 LAB — AMMONIA: Ammonia: 30 umol/L (ref 9–35)

## 2022-07-04 LAB — HEPARIN LEVEL (UNFRACTIONATED): Heparin Unfractionated: 0.68 IU/mL (ref 0.30–0.70)

## 2022-07-04 LAB — ETHANOL: Alcohol, Ethyl (B): <10 mg/dL (ref <10)

## 2022-07-04 LAB — CBG MONITORING, ED: Glucose-Capillary: 103 mg/dL — ABNORMAL HIGH (ref 70–99)

## 2022-07-04 LAB — BRAIN NATRIURETIC PEPTIDE: B Natriuretic Peptide: 5.4 pg/mL (ref 0.0–100.0)

## 2022-07-04 MED ORDER — ROSUVASTATIN CALCIUM 20 MG PO TABS
40.0000 mg | ORAL_TABLET | Freq: Every day | ORAL | Status: DC
  Administered 2022-07-05: 40 mg via ORAL
  Filled 2022-07-04: qty 2

## 2022-07-04 MED ORDER — OXYCODONE HCL 5 MG PO TABS: 5.0000 mg | ORAL_TABLET | ORAL | Status: DC | PRN

## 2022-07-04 MED ORDER — ONDANSETRON HCL 4 MG/2ML IJ SOLN: 4.0000 mg | Freq: Four times a day (QID) | INTRAMUSCULAR | Status: DC | PRN

## 2022-07-04 MED ORDER — ALPRAZOLAM 0.25 MG PO TABS: 2.0000 mg | ORAL_TABLET | Freq: Three times a day (TID) | ORAL | Status: DC | PRN

## 2022-07-04 MED ORDER — ASPIRIN 81 MG PO CHEW
324.0000 mg | CHEWABLE_TABLET | Freq: Once | ORAL | Status: AC
  Administered 2022-07-04: 324 mg via ORAL

## 2022-07-04 MED ORDER — ALBUTEROL SULFATE (2.5 MG/3ML) 0.083% IN NEBU: 2.5000 mg | INHALATION_SOLUTION | Freq: Four times a day (QID) | RESPIRATORY_TRACT | Status: DC | PRN

## 2022-07-04 MED ORDER — IOHEXOL 350 MG/ML SOLN
100.0000 mL | Freq: Once | INTRAVENOUS | Status: AC | PRN
  Administered 2022-07-04: 100 mL via INTRAVENOUS

## 2022-07-04 MED ORDER — HEPARIN BOLUS VIA INFUSION
4000.0000 [IU] | Freq: Once | INTRAVENOUS | Status: AC
  Administered 2022-07-04: 4000 [IU] via INTRAVENOUS
  Filled 2022-07-04: qty 4000

## 2022-07-04 MED ORDER — PANTOPRAZOLE SODIUM 40 MG PO TBEC
40.0000 mg | DELAYED_RELEASE_TABLET | Freq: Every day | ORAL | Status: DC
  Administered 2022-07-04 – 2022-07-05 (×2): 40 mg via ORAL
  Filled 2022-07-04 (×2): qty 1

## 2022-07-04 MED ORDER — SODIUM CHLORIDE 0.9 % IV SOLN
INTRAVENOUS | Status: AC
  Administered 2022-07-05: 250 mL via INTRAVENOUS

## 2022-07-04 MED ORDER — ACETAMINOPHEN 325 MG PO TABS: 650.0000 mg | ORAL_TABLET | ORAL | Status: DC | PRN

## 2022-07-04 MED ORDER — IPRATROPIUM-ALBUTEROL 0.5-2.5 (3) MG/3ML IN SOLN
3.0000 mL | Freq: Once | RESPIRATORY_TRACT | Status: AC
  Administered 2022-07-04: 3 mL via RESPIRATORY_TRACT
  Filled 2022-07-04: qty 3

## 2022-07-04 MED ORDER — ASPIRIN 81 MG PO TBEC: 81.0000 mg | DELAYED_RELEASE_TABLET | Freq: Every day | ORAL | Status: DC

## 2022-07-04 MED ORDER — NITROGLYCERIN 0.4 MG SL SUBL: 0.4000 mg | SUBLINGUAL_TABLET | SUBLINGUAL | Status: DC | PRN

## 2022-07-04 MED ORDER — HEPARIN (PORCINE) 25000 UT/250ML-% IV SOLN
950.0000 [IU]/h | INTRAVENOUS | Status: DC
  Administered 2022-07-04: 1000 [IU]/h via INTRAVENOUS
  Filled 2022-07-04: qty 250

## 2022-07-04 MED ORDER — FLUOXETINE HCL 20 MG PO CAPS
40.0000 mg | ORAL_CAPSULE | Freq: Every day | ORAL | Status: DC
  Administered 2022-07-05: 40 mg via ORAL
  Filled 2022-07-04 (×2): qty 2

## 2022-07-04 NOTE — H&P (Signed)
History and Physical    [name] [name] GNF:[id number] DOB: [birth year] DOA: 07/04/2022  PCP: Nelwyn Salisbury, MD   Patient coming from: Home   Chief Complaint: Chest pain, increased SOB   HPI: [name] [name] is a pleasant 67 y.o. female with medical history significant for emphysema, interstitial lung disease, OSA with Inspire implant, and anxiety, now presenting to the emergency department at the direction of her cardiologist for evaluation of worsening shortness of breath and chest pain.  Patient had been experiencing pain radiating around from below the bilateral breasts to her back for the past month, but reports that this resolved with albuterol.  She then developed a different type of chest discomfort this morning which was localized to the central chest.  She has been experiencing some left arm discomfort but believes that this is a separate issue.  She was having severe exertional dyspnea associated with the chest discomfort this morning, and was unable to ambulate across the room without severe symptoms.  She was able to get in to the cardiology clinic for evaluation today, there was concern for unstable angina, and she was sent to the ED.  ED Course: Upon arrival to the ED, patient is found to be afebrile and saturating well with normal heart rate and systolic blood pressure of 110 and greater.  EKG demonstrates sinus rhythm, initial troponin is normal, and BNP is normal.  Chemistry panel notable for elevated BUN to creatinine ratio.  Chest x-ray and CTA chest are both negative for acute findings.  Cardiology was consulted by the ED physician and the patient was started on IV heparin.  Review of Systems:  All other systems reviewed and apart from HPI, are negative.  Past Medical History:  Diagnosis Date   Allergic rhinitis 01/23/2008   Angina pectoris 06/06/2016   Arthritis    Attention deficit hyperactivity disorder (ADHD) 02/07/2016   Back pain, lumbar 01/21/2009    Bell's palsy 02/06/2008   Bipolar disorder 12/13/2009   Chronic idiopathic constipation    Dizziness 04/16/2008   Edema, leg 01/21/2009   Epigastric pain    Essential hypertension 01/23/2008   Gastro-esophageal reflux disease without esophagitis 01/23/2008   Generalized anxiety disorder 01/23/2008   Headache 04/15/2007   Hidradenitis 10/04/2008   History of colonic polyps    Hyperlipidemia    Hypokalemia 07/23/2018   Insomnia 01/23/2008   Major depressive disorder 01/23/2008   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   Neck pain 05/06/2017   Obesity (BMI 30-39.9) 11/10/2015   OSA (obstructive sleep apnea) 04/15/2007   inspire implant; does not always remember to turn device on   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 Wynetta Emery   BREAST BIOPSY Right 1981   benign   BREAST EXCISIONAL BIOPSY Right 1981   benign   scar does not show   BREAST SURGERY Bilateral 12/11/2012   reductions per Dr. Ivin Booty in [facility]    CATARACT EXTRACTION, BILATERAL Bilateral last summer of 2018   at [facility]   COLONOSCOPY  03/07/2021   per Dr. Loreta Ave, adenomatous polyps, repeat in 7 yrs   DRUG INDUCED ENDOSCOPY N/A 01/30/2019   Procedure: DRUG INDUCED ENDOSCOPY;  Surgeon: Melida Quitter, MD;  Location: [facility];  Service: ENT;  Laterality: N/A;   ESOPHAGOGASTRODUODENOSCOPY  04/23/2011   per Dr. Collene Mares, normal    EYE SURGERY  06/18/2016   L eyelid    IMPLANTATION OF HYPOGLOSSAL NERVE STIMULATOR Right 03/25/2019   Procedure: IMPLANTATION OF HYPOGLOSSAL NERVE STIMULATOR;  Surgeon: Melida Quitter, MD;  Location: [place];  Service: ENT;  Laterality: Right;   [place]  2003   Dr. Saintclair Halsted   MULTIPLE TOOTH  EXTRACTIONS     ORIF WRIST FRACTURE Right 08/29/2018   REDUCTION MAMMAPLASTY Bilateral 2013   SPLIT NIGHT STUDY  11/11/2015   UVULOPALATOPHARYNGOPLASTY     VAGINAL HYSTERECTOMY  05/1995   secondary to fibroids.    Social History:   reports that she quit smoking about 18 months ago. Her smoking use included cigarettes. She has never used smokeless tobacco. She reports that she does not currently use alcohol. She reports that she does not use drugs.  Allergies  Allergen Reactions   Sulfonamide Derivatives Swelling    Facial swelling   Flexeril [Cyclobenzaprine] Other (See Comments)    Dizziness, made meniere's disease worse    Hydromorphone Itching   Morphine And Related Nausea And Vomiting   Sulfamethoxazole Other (See Comments)    Causes swelling     Family History  Problem Relation Age of Onset   Dementia Mother    Hyperlipidemia Mother    Asthma Father    Hypertension Sister    Hyperlipidemia Sister    Coronary artery disease Sister    Thyroid disease Sister    Hypertension Brother    Hyperlipidemia Brother    Coronary artery disease Brother    Hypertension Sister    Hyperlipidemia Sister    Alcohol abuse Other        fhx   Arthritis Other        fhx   Coronary artery disease Other        fhx   Depression Other        fhx   Diabetes Other        fhx   Hyperlipidemia Other        fhx   Hypertension Other        fhx   Sudden death Other        fhx     Prior to Admission medications   Medication Sig Start Date End Date Taking? Authorizing Provider  acyclovir (ZOVIRAX) 400 MG tablet TAKE 1 TABLET BY MOUTH 4 TIMES A DAY AS NEEDED 03/20/22   Laurey Morale, MD  albuterol (PROVENTIL) (2.5 MG/3ML) 0.083% nebulizer solution Take 3 mLs (2.5 mg total) by nebulization every 6 (six) hours as needed for wheezing or shortness of breath. 06/25/22   Rigoberto Noel, MD  alprazolam [name] 2 MG tablet TAKE 1 TABLET BY MOUTH 3 TIMES DAILY AS NEEDED FOR ANXIETY. 03/02/22   Laurey Morale, MD  aspirin EC 81 MG tablet Take 81 mg by mouth daily. Swallow whole.    [provider]  B Complex-C-Folic Acid (B-COMPLEX/VITAMIN C PO) Take 1 tablet by mouth daily.    [provider]  Cholecalciferol (VITAMIN D3) 125 MCG (5000 UT) CAPS Take 5,000 Units by mouth once a week.    [provider]  FLUoxetine (PROZAC) 40 MG capsule TAKE 1 CAPSULE (40 MG TOTAL) BY MOUTH DAILY. 06/12/22   Laurey Morale, MD  hydrocortisone (ANUSOL-HC) 2.5 % rectal cream Place 1 Application rectally  2 (two) times daily. 12/05/21   Rancour, Jeannett SeniorStephen, MD  Magnesium 500 MG CAPS Take 500 mg by mouth daily.    [provider]  omeprazole (PRILOSEC) 20 MG capsule TAKE 1 CAPSULE BY MOUTH 2 TIMES DAILY BEFORE A MEAL. 10/09/21   Nelwyn SalisburyFry, Stephen A, MD  polyethylene glycol [facility](MIRALAX / Ethelene HalGLYCOLAX) 17 g packet Use one packet daily 10/10/21   Nelwyn SalisburyFry, Stephen A, MD  potassium chloride (KLOR-CON) 10 MEQ tablet Take 1 tablet (10 mEq total) by mouth daily. 07/02/22   Nelwyn SalisburyFry, Stephen A, MD  rosuvastatin (CRESTOR) 40 MG tablet TAKE 1 TABLET (40 MG TOTAL) BY MOUTH DAILY. 07/02/22   Nelwyn SalisburyFry, Stephen A, MD    Physical Exam: Vitals:   07/04/22 1630 07/04/22 1745 07/04/22 1800 07/04/22 1915  BP: 115/84 122/86 (!) 112/45 117/78  Pulse: 70 65 73 76  Resp: 12 11 15  (!) 21  Temp:      TempSrc:      SpO2: 97% 95% 93% 95%    Constitutional: NAD, calm  Eyes: PERTLA, lids and conjunctivae normal ENMT: Mucous membranes are moist. Posterior pharynx clear of any exudate or lesions.   Neck: supple, no masses  Respiratory: no wheezing, no crackles. No accessory muscle use.  Cardiovascular: S1 & S2 heard, regular rate and rhythm. No JVD. Abdomen: No distension, no tenderness, soft. Bowel sounds active.  Musculoskeletal: no clubbing / cyanosis. No joint deformity upper and lower extremities.   Skin: no significant rashes, lesions, ulcers. Warm, dry, well-perfused. Neurologic: CN 2-12 grossly intact. Moving all  extremities. Alert and oriented.  Psychiatric: Pleasant. Cooperative.    Labs and Imaging on Admission: I have personally reviewed following labs and imaging studies  CBC: Recent Labs  Lab 07/04/22 1600  WBC 7.7  NEUTROABS 5.2  HGB 11.6*  HCT 35.6*  MCV 93.0  PLT 158   Basic Metabolic Panel: Recent Labs  Lab 07/04/22 1600  NA 137  K 3.7  CL 102  CO2 26  GLUCOSE 98  BUN 24*  CREATININE 0.75  CALCIUM 8.9   GFR: Estimated Creatinine Clearance: 69.1 mL/min (by C-G formula based on SCr of 0.75 mg/dL). Liver Function Tests: Recent Labs  Lab 07/04/22 1600  AST 33  ALT 22  ALKPHOS 48  BILITOT 0.5  PROT 7.0  ALBUMIN 3.8   No results for input(s): "LIPASE", "AMYLASE" in the last 168 hours. No results for input(s): "AMMONIA" in the last 168 hours. Coagulation Profile: No results for input(s): "INR", "PROTIME" in the last 168 hours. Cardiac Enzymes: No results for input(s): "CKTOTAL", "CKMB", "CKMBINDEX", "TROPONINI" in the last 168 hours. BNP (last 3 results) No results for input(s): "PROBNP" in the last 8760 hours. HbA1C: No results for input(s): "HGBA1C" in the last 72 hours. CBG: Recent Labs  Lab 07/04/22 1821  GLUCAP 103*   Lipid Profile: No results for input(s): "CHOL", "HDL", "LDLCALC", "TRIG", "CHOLHDL", "LDLDIRECT" in the last 72 hours. Thyroid Function Tests: No results for input(s): "TSH", "T4TOTAL", "FREET4", "T3FREE", "THYROIDAB" in the last 72 hours. Anemia Panel: No results for input(s): "VITAMINB12", "FOLATE", "FERRITIN", "TIBC", "IRON", "RETICCTPCT" in the last 72 hours. Urine analysis:    Component Value Date/Time   COLORURINE YELLOW 11/30/2021 1230   APPEARANCEUR CLEAR 11/30/2021 1230   LABSPEC 1.030 11/30/2021 1230   PHURINE 5.0 11/30/2021 1230   GLUCOSEU NEGATIVE 11/30/2021 1230   HGBUR NEGATIVE 11/30/2021 1230   HGBUR negative 05/10/2009 0946   BILIRUBINUR NEGATIVE 11/30/2021 1230   BILIRUBINUR n 06/30/2018 1456   KETONESUR  NEGATIVE 11/30/2021 1230   PROTEINUR NEGATIVE 11/30/2021 1230   UROBILINOGEN 0.2 06/30/2018 1456   UROBILINOGEN 0.2 01/03/2015 1050   NITRITE NEGATIVE 11/30/2021 1230   LEUKOCYTESUR NEGATIVE 11/30/2021 1230   Sepsis Labs: @LABRCNTIP (procalcitonin:4,lacticidven:4) )No results found for this or any previous visit (from the past 240 hour(s)).   Radiological Exams on Admission: CT Angio Chest/Abd/Pel for Dissection W and/or Wo Contrast  Result Date: 07/04/2022 CLINICAL DATA:  CP radiating to back, sob, decrease left DP pulse EXAM: CT ANGIOGRAPHY CHEST, ABDOMEN AND PELVIS TECHNIQUE: Non-contrast CT of the chest was initially obtained. Multidetector CT imaging through the chest, abdomen and pelvis was performed using the standard protocol during bolus administration of intravenous contrast. Multiplanar reconstructed images and MIPs were obtained and reviewed to evaluate the vascular anatomy. RADIATION DOSE REDUCTION: This exam was performed according to the departmental dose-optimization program which includes automated exposure control, adjustment of the mA and/or kV according to patient size and/or use of iterative reconstruction technique. CONTRAST:  07/06/2022 OMNIPAQUE IOHEXOL 350 MG/ML SOLN COMPARISON:  CT chest high-resolution 01/25/2022, CT angio chest 11/30/2021, CT abdomen pelvis 12/05/2021 FINDINGS: CTA CHEST FINDINGS Cardiovascular: Preferential opacification of the thoracic aorta. No evidence of thoracic aortic aneurysm or dissection. Normal heart size. No significant pericardial effusion. Mild-to-moderate atherosclerotic plaque of the thoracic aorta. Four-vessel coronary artery calcifications. Mediastinum/Nodes: No enlarged mediastinal, hilar, or axillary lymph nodes. Thyroid gland, trachea, and esophagus demonstrate no significant findings. Lungs/Pleura: Peripheral reticulations and cystic changes-findings better evaluated on CT chest 01/25/2022. No focal consolidation. No pulmonary nodule. No  pulmonary mass. No pleural effusion. No pneumothorax. Musculoskeletal: No chest wall abnormality. Chronic appearing calcification versus suture material along the right lower anterolateral chest (7:102). No suspicious lytic or blastic osseous lesions. No acute displaced fracture. Review of the MIP images confirms the above findings. CTA ABDOMEN AND PELVIS FINDINGS VASCULAR Aorta: Severe atherosclerotic plaque. Normal caliber aorta without aneurysm, dissection, vasculitis or significant stenosis. Celiac: Mild atherosclerotic plaque. Patent without evidence of aneurysm, dissection, vasculitis or significant stenosis. SMA: Mild atherosclerotic plaque. Patent without evidence of aneurysm, dissection, vasculitis or significant stenosis. Renals: Mild atherosclerotic plaque. Both renal arteries are patent without evidence of aneurysm, dissection, vasculitis, fibromuscular dysplasia or significant stenosis. IMA: Patent without evidence of aneurysm, dissection, vasculitis or significant stenosis. Inflow: Moderate to severe narrowing of the left common iliac artery lumen down to a caliber of 4 x 6 mm. Patent without evidence of aneurysm, dissection, vasculitis or significant stenosis. Veins: No obvious venous abnormality within the limitations of this arterial phase study. Review of the MIP images confirms the above findings. NON-VASCULAR Hepatobiliary: No focal liver abnormality. No gallstones, gallbladder wall thickening, or pericholecystic fluid. No biliary dilatation. Pancreas: No focal lesion. Normal pancreatic contour. No surrounding inflammatory changes. No main pancreatic ductal dilatation. Spleen: Normal in size without focal abnormality. Adrenals/Urinary Tract: No adrenal nodule bilaterally. Bilateral kidneys enhance symmetrically. No hydronephrosis. No hydroureter. The urinary bladder is unremarkable. Stomach/Bowel: Stomach is within normal limits. No evidence of bowel wall thickening or dilatation. Appendix  appears normal. Lymphatic: No lymphadenopathy. Reproductive: Status post hysterectomy. No adnexal masses. Other: No intraperitoneal free fluid. No intraperitoneal free gas. No organized fluid collection. Musculoskeletal: No abdominal wall hernia or abnormality. No suspicious lytic or blastic osseous lesions. No acute displaced fracture. Multilevel degenerative changes of the spine. Review of the MIP images confirms the above findings. IMPRESSION: 1. No acute aortic abnormality. 2. Aortic Atherosclerosis (ICD10-I70.0) -severe with moderate to severe narrowing of the left common iliac artery lumen down to  a caliber of 4 x 6 mm. 3. No acute nonvascular intrathoracic, intra-abdominal, intrapelvic abnormality. 4. Pulmonary fibrosis-better evaluated on CT chest high-resolution 01/25/2022. Electronically Signed   By: Tish Frederickson M.D.   On: 07/04/2022 18:25   ECHOCARDIOGRAM COMPLETE  Result Date: 07/04/2022    ECHOCARDIOGRAM REPORT   Patient Name:   TRESSA MALDONADO Hillier Date of Exam: 07/04/2022 Medical Rec #:  [id number]          Height:       62.0 in Accession #:    [id number]         Weight:       187.8 lb Date of Birth:  07-17-1955         BSA:          1.861 m Patient Age:    66 years           BP:           117/64 mmHg Patient Gender: F                  HR:           70 bpm. Exam Location:  Inpatient Procedure: 2D Echo Indications:    chest pain  History:        Patient has no prior history of Echocardiogram examinations.                 Signs/Symptoms:Edema; Risk Factors:Sleep Apnea, Hypertension and                 Dyslipidemia.  Sonographer:    Delcie Roch RDCS Referring Phys: [id number] Perlie Gold IMPRESSIONS  1. Left ventricular ejection fraction, by estimation, is 60 to 65%. The left ventricle has normal function. The left ventricle has no regional wall motion abnormalities. Left ventricular diastolic parameters were normal.  2. Right ventricular systolic function is normal. The right ventricular size  is normal.  3. The mitral valve is normal in structure. No evidence of mitral valve regurgitation. No evidence of mitral stenosis.  4. The aortic valve has an indeterminant number of cusps. Aortic valve regurgitation is not visualized. No aortic stenosis is present.  5. The inferior vena cava is normal in size with greater than 50% respiratory variability, suggesting right atrial pressure of 3 mmHg. FINDINGS  Left Ventricle: Left ventricular ejection fraction, by estimation, is 60 to 65%. The left ventricle has normal function. The left ventricle has no regional wall motion abnormalities. The left ventricular internal cavity size was normal in size. There is  no left ventricular hypertrophy. Left ventricular diastolic parameters were normal. Right Ventricle: The right ventricular size is normal. Right ventricular systolic function is normal. Left Atrium: Left atrial size was normal in size. Right Atrium: Right atrial size was normal in size. Pericardium: There is no evidence of pericardial effusion. Mitral Valve: The mitral valve is normal in structure. No evidence of mitral valve regurgitation. No evidence of mitral valve stenosis. Tricuspid Valve: The tricuspid valve is normal in structure. Tricuspid valve regurgitation is not demonstrated. No evidence of tricuspid stenosis. Aortic Valve: The aortic valve has an indeterminant number of cusps. Aortic valve regurgitation is not visualized. No aortic stenosis is present. Pulmonic Valve: The pulmonic valve was normal in structure. Pulmonic valve regurgitation is not visualized. No evidence of pulmonic stenosis. Aorta: The aortic root is normal in size and structure. Venous: The inferior vena cava is normal in size with greater than 50% respiratory variability, suggesting right atrial pressure of 3  mmHg. IAS/Shunts: No atrial level shunt detected by color flow Doppler.  LEFT VENTRICLE PLAX 2D LVIDd:         3.60 cm   Diastology LVIDs:         2.40 cm   LV e' medial:     8.16 cm/s LV PW:         1.00 cm   LV E/e' medial:  8.8 LV IVS:        0.90 cm   LV e' lateral:   8.59 cm/s LVOT diam:     1.80 cm   LV E/e' lateral: 8.4 LV SV:         61 LV SV Index:   33 LVOT Area:     2.54 cm  RIGHT VENTRICLE            IVC RV Basal diam:  2.70 cm    IVC diam: 1.10 cm RV S prime:     9.68 cm/s TAPSE (M-mode): 1.8 cm LEFT ATRIUM             Index        RIGHT ATRIUM           Index LA diam:        2.70 cm 1.45 cm/m   RA Area:     11.00 cm LA Vol (A2C):   29.1 ml 15.63 ml/m  RA Volume:   22.70 ml  12.20 ml/m LA Vol (A4C):   20.4 ml 10.96 ml/m LA Biplane Vol: 24.9 ml 13.38 ml/m  AORTIC VALVE LVOT Vmax:   125.00 cm/s LVOT Vmean:  75.300 cm/s LVOT VTI:    0.238 m  AORTA Ao Root diam: 2.60 cm Ao Asc diam:  3.10 cm MITRAL VALVE MV Area (PHT): 3.21 cm    SHUNTS MV Decel Time: 236 msec    Systemic VTI:  0.24 m MV E velocity: 72.20 cm/s  Systemic Diam: 1.80 cm MV A velocity: 89.50 cm/s MV E/A ratio:  0.81 Olga MillersBrian Crenshaw MD Electronically signed by Olga MillersBrian Crenshaw MD Signature Date/Time: 07/04/2022/5:25:09 PM    Final    DG Chest Portable 1 View  Result Date: 07/04/2022 CLINICAL DATA:  Chest pain and shortness of breath EXAM: PORTABLE CHEST 1 VIEW COMPARISON:  Chest x-ray dated November 30, 2021; chest CT dated June 30, 2022 FINDINGS: Cardiac and mediastinal contours are within normal limits. Right chest wall generator pack with single lead extending superiorly outside the field of view. Emphysema. No consolidation. No pleural effusion or pneumothorax. IMPRESSION: No active disease. Electronically Signed   By: Allegra LaiLeah  Strickland M.D.   On: 07/04/2022 16:44    EKG: Independently reviewed. Sinus rhythm.   Assessment/Plan   1. Chest pain  - Sent for possible unstable angina  - EKG appears non-ischemic and initial troponin is normal  - TTE in ED has normal EF, no wall-motion abnormality  - Check second troponin, continue IV heparin infusion, continue cardiac monitoring, continue ASA and  statin, check lipids in am    2. Emphysema; ILD  - Stable, not in exacerbation on admission   - Continue as-needed albuterol    3. Anxiety  - Continue Prozac and as-needed Xanax    4. Acute encephalopathy  - There was concern for confusion in ED but this appears to have resolved, she is alert and fully oriented at time of admission  - Continue to monitor     DVT prophylaxis: IV heparin  Code Status: Full  Level of Care: Level of  care: Telemetry Cardiac Family Communication: ex-husband at bedside   Disposition Plan:  Patient is from: home  Anticipated d/c is to: home  Anticipated d/c date is: Possibly as early as 1/18 or 07/06/22 Patient currently: Pending cardiology consultation, likely Scott called: cardiology  Admission status: Observation     Vianne Bulls, MD [facility]  07/04/2022, 8:07 PM

## 2022-07-04 NOTE — Addendum Note (Signed)
Addended by: Joni Reining on: 07/04/2022 03:09 PM   Modules accepted: Orders

## 2022-07-04 NOTE — Patient Instructions (Signed)
Medication Instructions:  Aspirin was given.  *If you need a refill on your cardiac medications before your next appointment, please call your pharmacy*   Lab Work: None. If you have labs (blood work) drawn today and your tests are completely normal, you will receive your results only by: [place] (if you have MyChart) OR A paper copy in the mail If you have any lab test that is abnormal or we need to change your treatment, we will call you to review the results.   Testing/Procedures: None.   Follow-Up: At [facility], you and your health needs are our priority.  As part of our continuing mission to provide you with exceptional heart care, we have created designated Provider Care Teams.  These Care Teams include your primary Cardiologist (physician) and Advanced Practice Providers (APPs -  Physician Assistants and Nurse Practitioners) who all work together to provide you with the care you need, when you need it.  We recommend signing up for the patient portal called "MyChart".  Sign up information is provided on this After Visit Summary.  MyChart is used to connect with patients for Virtual Visits (Telemedicine).  Patients are able to view lab/test results, encounter notes, upcoming appointments, etc.  Non-urgent messages can be sent to your provider as well.   To learn more about what you can do with MyChart, go to [email or web address].    Your next appointment:  Will be dependent upon your ER visit.  Provider:   Dr. Fransico Him, MD   Other Instructions You were sent to the ED for chest pain via EMS. Please call us to schedule a follow up appointment after you are discharged from the ED.

## 2022-07-04 NOTE — ED Provider Notes (Signed)
[facility] EMERGENCY DEPARTMENT Provider Note   CSN: [id number] Arrival date & time: 07/04/22  1548     History  Chief Complaint  Patient presents with   Chest Pain    [name] [name] is a 67 y.o. female.  With PMH of obesity, HTN, HLD, OSA, ILD who is sent in today by Dr. Radford Pax of cardiology for concerns of unstable angina.  Patient given nitroglycerin sublingual tablet and 324 mg aspirin from EMS with improvement.  Patient notes she has had intermittent substernal chest tightness and pressure associated with shortness of breath mainly with exertion.  However today around 7 AM when she was walking to the fridge she felt the same symptoms again but it did not relieved with rest. It was associated with pain in LUE and pain in her bra strap area on her back but does not feel like it is radiating to her back. She was still experiencing it when she went to her cardiology appointment and they were concerned for unstable angina so sent her to the ED for plans for cardiac catheterization tomorrow.  Patient denies any recent fevers, cough, congestion or rhinorrhea.  She has had no leg pain or swelling.  She denies any history of MI, PE or DVT.  She is compliant with her aspirin daily.   Chest Pain      Home Medications Prior to Admission medications   Medication Sig Start Date End Date Taking? Authorizing Provider  acyclovir (ZOVIRAX) 400 MG tablet TAKE 1 TABLET BY MOUTH 4 TIMES A DAY AS NEEDED 03/20/22   Laurey Morale, MD  albuterol (PROVENTIL) (2.5 MG/3ML) 0.083% nebulizer solution Take 3 mLs (2.5 mg total) by nebulization every 6 (six) hours as needed for wheezing or shortness of breath. 06/25/22   Rigoberto Noel, MD  alprazolam [name] 2 MG tablet TAKE 1 TABLET BY MOUTH 3 TIMES DAILY AS NEEDED FOR ANXIETY. 03/02/22   Laurey Morale, MD  aspirin EC 81 MG tablet Take 81 mg by mouth daily. Swallow whole.    [provider]  B Complex-C-Folic Acid (B-COMPLEX/VITAMIN  C PO) Take 1 tablet by mouth daily.    [provider]  Cholecalciferol (VITAMIN D3) 125 MCG (5000 UT) CAPS Take 5,000 Units by mouth once a week.    [provider]  FLUoxetine (PROZAC) 40 MG capsule TAKE 1 CAPSULE (40 MG TOTAL) BY MOUTH DAILY. 06/12/22   Laurey Morale, MD  hydrocortisone (ANUSOL-HC) 2.5 % rectal cream Place 1 Application rectally 2 (two) times daily. 12/05/21   Rancour, Annie Main, MD  Magnesium 500 MG CAPS Take 500 mg by mouth daily.    [provider]  omeprazole (PRILOSEC) 20 MG capsule TAKE 1 CAPSULE BY MOUTH 2 TIMES DAILY BEFORE A MEAL. 10/09/21   Laurey Morale, MD  polyethylene glycol [facility] / Floria Raveling) 17 g packet Use one packet daily 10/10/21   Laurey Morale, MD  potassium chloride (KLOR-CON) 10 MEQ tablet Take 1 tablet (10 mEq total) by mouth daily. 07/02/22   Laurey Morale, MD  rosuvastatin (CRESTOR) 40 MG tablet TAKE 1 TABLET (40 MG TOTAL) BY MOUTH DAILY. 07/02/22   Laurey Morale, MD      Allergies    Sulfonamide derivatives, Flexeril [cyclobenzaprine], Hydromorphone, Morphine and related, and Sulfamethoxazole    Review of Systems   Review of Systems  Cardiovascular:  Positive for chest pain.    Physical Exam Updated Vital Signs BP 117/64   Pulse 74  Temp 98 F (36.7 C) (Oral)   Resp (!) 23   SpO2 94%  Physical Exam Constitutional: Alert and oriented. Well appearing and in no distress. Eyes: Conjunctivae are normal. ENT      Head: Normocephalic and atraumatic.      Nose: No congestion.      Mouth/Throat: Mucous membranes are moist.      Neck: No stridor. Cardiovascular: S1, S2, regular rate and rhythm, equal palpable radial pulses, decreased left-sided DP pulse compared to right DP however LLE warm and dry Respiratory: Normal respiratory effort. Breath sounds are normal. O2 sat 94 on RA Gastrointestinal: Soft and nontender.  Musculoskeletal: Normal range of motion in all extremities.      Right lower leg: No tenderness or  edema.      Left lower leg: No tenderness or edema. Neurologic: Normal speech and language. No facial droop. Moving extremities x4. Sensation grossly intact. No gross focal neurologic deficits are appreciated. Skin: Skin is warm, dry and intact. No rash noted. Psychiatric: Mood and affect are normal. Speech and behavior are normal.  ED Results / Procedures / Treatments   Labs (all labs ordered are listed, but only abnormal results are displayed) Labs Reviewed  COMPREHENSIVE METABOLIC PANEL  BRAIN NATRIURETIC PEPTIDE  CBC WITH DIFFERENTIAL/PLATELET  TROPONIN I (HIGH SENSITIVITY)    EKG EKG Interpretation  Date/Time:  Wednesday July 04 2022 15:56:51 EST Ventricular Rate:  73 PR Interval:  156 QRS Duration: 98 QT Interval:  432 QTC Calculation: 477 R Axis:   61 Text Interpretation: Sinus rhythm No changes since prior EKG Confirmed by Vivien Rossetti ([id number]) on 07/04/2022 4:09:43 PM  Radiology No results found.  Procedures .Critical Care  Performed by: Mardene Sayer, MD Authorized by: Mardene Sayer, MD   Critical care provider statement:    Critical care time (minutes):  40   Critical care was necessary to treat or prevent imminent or life-threatening deterioration of the following conditions:  Cardiac failure   Critical care was time spent personally by me on the following activities:  Development of treatment plan with patient or surrogate, discussions with consultants, evaluation of patient's response to treatment, examination of patient, ordering and review of laboratory studies, ordering and review of radiographic studies, ordering and performing treatments and interventions, pulse oximetry, re-evaluation of patient's condition, review of old charts and obtaining history from patient or surrogate   Care discussed with: admitting provider       Medications Ordered in ED Medications  heparin ADULT infusion 100 units/mL (25000 units/21mL) (1,000 Units/hr  Intravenous New Bag/Given 07/04/22 1619)  heparin bolus via infusion 4,000 Units (4,000 Units Intravenous Bolus from Bag 07/04/22 1619)    ED Course/ Medical Decision Making/ A&P Clinical Course as of 07/04/22 2002  Wed Jul 04, 2022  2000 Troponin I (High Sensitivity): <2 [VB]  2001 S/w Dr Antionette Char hospitalist will admit patient with plans for catheterization by cardiology tomorrow for concerns for unstable angina. [VB]    Clinical Course User Index [VB] Mardene Sayer, MD   {                            Medical Decision Making [name] [name] is a 67 y.o. female.  With PMH of obesity, HTN, HLD, OSA, ILD who is sent in today by Dr. Mayford Knife of cardiology for concerns of unstable angina.  Patient given nitroglycerin sublingual tablet and 324 mg aspirin from EMS with improvement.  EKG on arrival reviewed unremarkable normal sinus rhythm with nonspecific T wave flattening aVL which is unchanged from prior EKG.  Her pain improved with aspirin and nitroglycerin administered by EMS.  Patient seen by Dr. Radford Pax of cardiology today who recommended heparin infusion as well as nitroglycerin infusion with plans for catheterization tomorrow for concerns for unstable angina.  Holding off from nitroglycerin infusion due to stable blood pressure 117/64 on arrival. Cardiology requesting medicine admission.  Chest x-ray obtained which I personally reviewed no pneumonia, no pneumothorax, no pulmonary edema.  Obtain CTA dissection although well appearance due to concern for decreased DP pulse of the left lower extremity and pain wrapping around to back.  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  High sensitive troponin undetected.  S/w Dr Myna Hidalgo hospitalist will admit patient with plans for catheterization by cardiology tomorrow for concerns for unstable angina.   Amount and/or Complexity of  Data Reviewed Labs: ordered. Decision-making details documented in ED Course. Radiology: ordered.  Risk Prescription drug management. Decision regarding hospitalization.    Final Clinical Impression(s) / ED Diagnoses Final diagnoses:  Unstable angina (HCC)  Chest pain, unspecified type    Rx / DC Orders ED Discharge Orders     None         Elgie Congo, MD 07/04/22 2002

## 2022-07-04 NOTE — ED Notes (Signed)
Patient transported to vascular. 

## 2022-07-04 NOTE — Progress Notes (Addendum)
CARDIOLOGY CONSULT NOTE  Date:  07/04/2022   ID:  [name] [name], DOB 17-Apr-1956, MRN [id number] The patient was identified using 2 identifiers.  PCP:  [name] Morale, MD   [name] [name] Cardiologist:  None     Evaluation Performed:  Follow-Up Visit  Chief Complaint:  OSA  History of Present Illness:    [name] [name] is a 67 y.o. female with a history of ADHD, bipolar disorder, hypertension, GERD, hyperlipidemia, obesity.  She has a hx of OSA on CPAP therapy.  Unfortunately she was intolerant to CPAP and requested ENT evaluation of  Inspire. Device.  She underwent hypoglossal nerve stimulator implant by Dr. Redmond [name] on 03/25/2019.    She was seen back on 05/01/2019 for device activation.  She was then seen in the office on 10/06/2019 and was complaining of feeling very sleepy and felt that her device need to be reprogrammed.  She was having a lot of daytime sleepiness as well as snoring.  Nerve stimulation was performed and her amplitude was increased from 1.7 to 2.5 V.  Her control range was changed to 2.3 to 3.3 V.  A few weeks later she had an in lab study done 04/30/2020 showed which showed no significant obstructive sleep apnea using her inspire device.    I then saw her back 07/31/2021 and she was doing great with her inspire device and was on level 5 which was 2.7 V.  She was feeling rested when she get up in the morning and had no daytime sleepiness.  She had no tongue sensitivity and her husband said she was very lightly snoring.  At that time an Itamar sleep study was ordered which showed moderate obstructive sleep apnea with an AHI of 17.9/h and O2 sats as low as 81%.  She is now brought back into the office for further evaluation due to an increase in her AHI.  At last office visit she was had really struggling with her inspire device and has not been able to sleep at night.  She was having a severe sore throat when she would wake up up in the morning and felt  like her tongue was all the way out of her mouth all night and very painful in the morning.  At that office visit  interrogation of her device was performed and showed that her thresholds had decreased to 1 V and it was likely that she had had a decrease in stimulation threshold over time due to healing and did not require a high pacing threshold any longer and was resulting in overstimulation of the hypoglossal nerve which was changing her anatomy  resulting in more apneas. Her parameters were changed with the stimulation range of 1.4 V to 1.8 V.  Her goal was to get to level 2 or 3 which would be 1.5 to 1.6 V.  She underwent Itamar sleep study 11/23/21 to make sure that her apneas were stable on current inspire settings.  This showed mild obstructive sleep apnea with an AHI 7.3/h  She was seen again 11.14.2023 due to recurrent snoring again even when she was on her side.  She had gained over 15 lbs since May.  She was on level 5 which is 1.8 volts.  She also had been more sleepy than usual.   She has been placed on Gabapentin that has caused weight gain. She also was just dx with ILD and emphysema. The gabapentin makes her sleepy as well.  At that [place] her  voltage was increased but that resulted in significant protrusion of her tongue with angulation to the left.  She was kept at level 5 (1.8V).  She underwent Itamar HST which showed very mild OSA with an AHI of 9/hr and no central events.  She is now here for followup.   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  She is being followed by Dr. Vassie [name] with Pulmonary.  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  She was started on Albuterol. She has never been dx with CAD in the past.  She says that her SOB has gotten worse recently but has also been having chest pain.  She says that the pain is a tightness and heaviness.  Today she got up at 5am and around 7am started  having chest pain.  She currently rates her pain a 7/10 at present.  She gets nauseated with the pain and actually has vomited last night with the pain.    She is doing well today.  She is averaging 8 hours nightly of sleep. She is getting into REM sleep with dreams.  She is currently on level 3 which corresponds to 1.4 V.  She does not have any tongue sensitivity or sore throat.  She is feeling exhausted all the time but is having CP and SOB with lung issues.  She says that she does not feel sleepy during the day just exhausted.  Past Medical History:  Diagnosis Date   Allergic rhinitis 01/23/2008   Angina pectoris 06/06/2016   Arthritis    Attention deficit hyperactivity disorder (ADHD) 02/07/2016   Back pain, lumbar 01/21/2009   Bell's palsy 02/06/2008   Bipolar disorder 12/13/2009   Chronic idiopathic constipation    Dizziness 04/16/2008   Edema, leg 01/21/2009   Epigastric pain    Essential hypertension 01/23/2008   Gastro-esophageal reflux disease without esophagitis 01/23/2008   Generalized anxiety disorder 01/23/2008   Headache 04/15/2007   Hidradenitis 10/04/2008   History of colonic polyps    Hyperlipidemia    Hypokalemia 07/23/2018   Insomnia 01/23/2008   Major depressive disorder 01/23/2008   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   Neck pain 05/06/2017   Obesity (BMI 30-39.9) 11/10/2015   OSA (obstructive sleep apnea) 04/15/2007   inspire implant; does not always remember to turn device on   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 Wynetta Emery   BREAST BIOPSY Right 1981   benign   BREAST EXCISIONAL BIOPSY Right 1981   benign   scar does not show   BREAST SURGERY Bilateral 12/11/2012   reductions per  Dr. Ivin Booty in [facility]    CATARACT EXTRACTION, BILATERAL Bilateral last summer of 2018   at [facility]   COLONOSCOPY  03/07/2021   per Dr. Loreta Ave, adenomatous polyps, repeat in 7 yrs   DRUG INDUCED ENDOSCOPY N/A 01/30/2019   Procedure: DRUG INDUCED ENDOSCOPY;  Surgeon: Christia Reading, MD;  Location: [facility];  Service: ENT;  Laterality: N/A;   ESOPHAGOGASTRODUODENOSCOPY  04/23/2011   per Dr. Loreta Ave, normal    EYE SURGERY  06/18/2016  L eyelid    IMPLANTATION OF HYPOGLOSSAL NERVE STIMULATOR Right 03/25/2019   Procedure: IMPLANTATION OF HYPOGLOSSAL NERVE STIMULATOR;  Surgeon: Christia Reading, MD;  Location: [facility] OR;  Service: ENT;  Laterality: Right;   LUMBAR DISC SURGERY  2003   Dr. Wynetta Emery   MULTIPLE TOOTH EXTRACTIONS     ORIF WRIST FRACTURE Right 08/29/2018   REDUCTION MAMMAPLASTY Bilateral 2013   SPLIT NIGHT STUDY  11/11/2015   UVULOPALATOPHARYNGOPLASTY     VAGINAL HYSTERECTOMY  05/1995   secondary to fibroids.     No outpatient medications have been marked as taking for the 07/04/22 encounter (Office Visit) with [name], MD.     Allergies:   Sulfonamide derivatives, Flexeril [cyclobenzaprine], Hydromorphone, Morphine and related, and Sulfamethoxazole   Social History   Tobacco Use   Smoking status: Former    Years: 30.00    Types: Cigarettes    Quit date: 12/2020    Years since quitting: 1.5   Smokeless tobacco: Never  Vaping Use   Vaping Use: Never used  Substance Use Topics   Alcohol use: Not Currently   Drug use: No     Family Hx: The patient's family history includes Alcohol abuse in an other family member; Arthritis in an other family member; Asthma in her father; Coronary artery disease in her brother, sister, and another family member; Dementia in her mother; Depression in an other family member; Diabetes in an other family member; Hyperlipidemia in her brother, mother, sister, sister, and another family member; Hypertension in her  brother, sister, sister, and another family member; Sudden death in an other family member; Thyroid disease in her sister.  ROS:   Please see the history of present illness.     All other systems reviewed and are negative.   Prior CV studies:   The following studies were reviewed today:  Home sleep study  Labs/Other Tests and Data Reviewed:    EKG:  EKG performed today demonstrates NSR with no acute ST changes  Recent Labs: 11/30/2021: B Natriuretic Peptide 26.3 12/05/2021: BUN 13; Creatinine, Ser 0.72; Hemoglobin 12.6; Platelets 183; Potassium 4.0; Sodium 141 12/20/2021: TSH 3.67 05/22/2022: ALT 25   Recent Lipid Panel Lab Results  Component Value Date/Time   CHOL 157 05/22/2022 10:55 AM   TRIG 135.0 05/22/2022 10:55 AM   HDL 58.30 05/22/2022 10:55 AM   CHOLHDL 3 05/22/2022 10:55 AM   LDLCALC 71 05/22/2022 10:55 AM   LDLCALC 124 (H) 03/17/2020 11:52 AM   LDLDIRECT 129.0 12/07/2021 11:14 AM    Wt Readings from Last 3 Encounters:  06/25/22 191 lb 9.6 oz (86.9 kg)  06/07/22 183 lb 9.6 oz (83.3 kg)  05/01/22 180 lb 6.4 oz (81.8 kg)     Risk Assessment/Calculations:      STOP-Bang Score:  3      Objective:    Vital Signs:  There were no vitals taken for this visit.  GEN: Well nourished, well developed in no acute distress HEENT: Normal NECK: No JVD; No carotid bruits LYMPHATICS: No lymphadenopathy CARDIAC:RRR, no murmurs, rubs, gallops RESPIRATORY:  Clear to auscultation without rales, wheezing or rhonchi  ABDOMEN: Soft, non-tender, non-distended MUSCULOSKELETAL:  No edema; No deformity  SKIN: Warm and dry NEUROLOGIC:  Alert and oriented x 3 PSYCHIATRIC:  Normal affect  ASSESSMENT & PLAN:    OSA -intolerant to CPAP therapy -s/p hypoglossal nerve stimulator -As stated above her stimulation range was increased to 2.3-3.3V at office visit 09/2019 with repeat sleep study in November 2021  showing no residual sleep apnea -Itamar sleep study was done 08/08/2021 for  follow-up which showed moderate obstructive sleep apnea with an AHI of 17.9/h -At last OV she was having a lot of difficulty with sore throat in the morning as well as feeling like her tongue is out of her mouth the hallway all night and has not been sleeping well at all -Her thresholds had decreased to 1 V and it was felt that she had had a decrease in stimulation threshold over time due to healing and did not require a high pacing threshold any longef resulting in overstimulation of the hypoglossal nerve which is changing her anatomy and was resulting in more apneas. -Her parameters were changed with the stimulation range of 1.4 V to 1.8 V with a  goal  to get to level 2 or 3 which would be 1.5 to 1.6 V. -She is doing great on the current settings and is on level 5 with complete resolution of her mouth dryness and sore throat and has no daytime sleepiness -A Itamar home sleep study in June 2023 showed residual mild obstructive sleep apnea on level 5 at 1.8V with an AHI of 7.3/h but down from 17/hour at her baseline study -OV  05/01/2022 she had increased snoring on her side and had gained 15lbs since May and suspect that she is having more apneas>>increased voltage was tried in office but had significant protrusion with angulation to the left >> kept at  level 5 at 1.8V  -Repeat HST 05/13/2022 with AHI 9/hr -Office visit today 06/07/2022 her device was interrogated.  She is currently at level 5 which is 1.8 V and testing at this voltage shows prominent left-sided tongue deviation.  Suspect that this may be distorting anatomy and causing her neck to actually have more apneas and resulting in more snoring.  Her device was reprogrammed after testing her tongue motion at multiple levels.  1.4 V showed good tongue motion.  Her device was reprogrammed with a range of 1.2 to 1.6 V.  Her optimal level will be level 3 which is 1.4 V. -She is doing well today.  She is sleeping 8 hours nightly and feels refreshed in  the morning with no daytime sleepiness.  She is on level 3 which is 1.4V. -No changes were made to her therapy today.    2.  Hypertension -BP is c controlled -She is not on any antihypertensive therapy  3.  Chest pain/Unstable Angina/SOB -she recently started having worsening SOB and had a chest HRCT 1/24 showing progression of UIP -her SOB has gotten worse and is now having chest pain>>her CT showed diffuse coronary artery calcifications in 3 epicardial vessels.  -currently CP 7/10 and got nauseated with the pain last night and vomited>>EKG today is nonischemia -Will send to ER by EMS for admission by [facility] with Cardiology consulting -check hsTrop x 2 -start IV Heparin gtt for possible ACS -check 2D echo -make NPO after MN for LHC in am -Shared Decision Making/Informed Consent The risks [stroke (1 in 1000), death (1 in 1000), kidney failure [usually temporary] (1 in 500), bleeding (1 in 200), allergic reaction [possibly serious] (1 in 200)], benefits (diagnostic support and management of coronary artery disease) and alternatives of a cardiac catheterization were discussed in detail with Ms. Aki and she is willing to proceed. -Continue ASA 81mg  daily, Crestor 40mg  daily -start IV NTG gtt and titrate for CP -check FLP in am     Time:   Today, I  have spent 30 minutes with the patient with telehealth technology discussing the above problems.     Medication Adjustments/Labs and Tests Ordered: Current medicines are reviewed at length with the patient today.  Concerns regarding medicines are outlined above.   Tests Ordered: No orders of the defined types were placed in this encounter.   Medication Changes: No orders of the defined types were placed in this encounter.   Follow Up:  Followup with me after sleep study  Signed, Fransico Him, MD  07/04/2022 2:10 PM    [place]

## 2022-07-04 NOTE — ED Notes (Signed)
Pt transported to CT with RN and back to room. Pt is able to answer orientation questions appropriately, but is presenting as mildly confused, appears very fatigued. RN to check blood sugar and follow up with MD.

## 2022-07-04 NOTE — ED Triage Notes (Addendum)
Pt arrived via [facility] for chest pain from [facility] cardiology. Pt reports waking up at 5am with weakness/fatigue, sudden onset center, dull chest pain starting at 7am, worse with exertion. Pt attended 2pm doctors apt, conveyed chest pain. 324 ASA administered and EMS called. EMS administered 1 SL Nitro with decrease in chest pain from 5/10 to 4/10. 18g LFA IV. Heart cath likely per MD at office.   PTA Vitals  HR 66 BP 157/97 -> 117/68 post nitro SPO2 98% 2L [place] RR 18 CBG 118

## 2022-07-04 NOTE — Progress Notes (Signed)
ANTICOAGULATION CONSULT NOTE - Initial Consult  Pharmacy Consult for heparin Indication: chest pain/ACS  Allergies  Allergen Reactions   Sulfonamide Derivatives Swelling    Facial swelling   Flexeril [Cyclobenzaprine] Other (See Comments)    Dizziness, made meniere's disease worse    Hydromorphone Itching   Morphine And Related Nausea And Vomiting   Sulfamethoxazole Other (See Comments)    Causes swelling     Vital Signs: Temp: 98 F (36.7 C) (01/17 1615) Temp Source: Oral (01/17 1615) BP: 115/84 (01/17 1630) Pulse Rate: 70 (01/17 1630)  Labs: Recent Labs    07/04/22 1600  HGB 11.6*  HCT 35.6*  PLT 158  CREATININE 0.75  TROPONINIHS <2    Estimated Creatinine Clearance: 69.1 mL/min (by C-G formula based on SCr of 0.75 mg/dL).   Medical History: Past Medical History:  Diagnosis Date   Allergic rhinitis 01/23/2008   Angina pectoris 06/06/2016   Arthritis    Attention deficit hyperactivity disorder (ADHD) 02/07/2016   Back pain, lumbar 01/21/2009   Bell's palsy 02/06/2008   Bipolar disorder 12/13/2009   Chronic idiopathic constipation    Dizziness 04/16/2008   Edema, leg 01/21/2009   Epigastric pain    Essential hypertension 01/23/2008   Gastro-esophageal reflux disease without esophagitis 01/23/2008   Generalized anxiety disorder 01/23/2008   Headache 04/15/2007   Hidradenitis 10/04/2008   History of colonic polyps    Hyperlipidemia    Hypokalemia 07/23/2018   Insomnia 01/23/2008   Major depressive disorder 01/23/2008   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 Not on AC PTA. Pharmacy consulted to dose heparin.   Hgb 11.6 and plt 158. Plan for cath tomorrow 1/18.   Goal of Therapy:  Heparin level 0.3-0.7 units/ml Monitor platelets by anticoagulation protocol: Yes   Plan:  Give heparin 4000 units IV x1  Start heparin infusion at 1000 units/hr  Will f/u heparin level in 6 hours Monitor daily heparin level and CBC Continue to monitor H&H     Gena Fray, PharmD PGY1 Pharmacy Resident   07/04/2022 5:55 PM

## 2022-07-04 NOTE — Addendum Note (Signed)
Addended by: Patterson Hammersmith A on: 07/04/2022 02:53 PM   Modules accepted: Orders

## 2022-07-04 NOTE — Progress Notes (Signed)
ANTICOAGULATION CONSULT NOTE  Pharmacy Consult for heparin Indication: chest pain/ACS  Allergies  Allergen Reactions   Sulfonamide Derivatives Swelling    Facial swelling   Flexeril [Cyclobenzaprine] Other (See Comments)    Dizziness, made meniere's disease worse    Hydromorphone Itching   Morphine And Related Nausea And Vomiting   Sulfamethoxazole Other (See Comments)    Causes swelling     Vital Signs: Temp: 97.4 F (36.3 C) (01/17 2120) Temp Source: Oral (01/17 2120) BP: 121/62 (01/17 2200) Pulse Rate: 75 (01/17 2200)  Labs: Recent Labs    07/04/22 1600 07/04/22 1941 07/04/22 2011 07/04/22 2217  HGB 11.6*  --  11.9*  --   HCT 35.6*  --  35.0*  --   PLT 158  --   --   --   HEPARINUNFRC  --   --   --  0.68  CREATININE 0.75  --   --   --   TROPONINIHS <2 <2  --   --      Estimated Creatinine Clearance: 68 mL/min (by C-G formula based on SCr of 0.75 mg/dL).   Medical History: Past Medical History:  Diagnosis Date   Allergic rhinitis 01/23/2008   Angina pectoris 06/06/2016   Arthritis    Attention deficit hyperactivity disorder (ADHD) 02/07/2016   Back pain, lumbar 01/21/2009   Bell's palsy 02/06/2008   Bipolar disorder 12/13/2009   Chronic idiopathic constipation    Dizziness 04/16/2008   Edema, leg 01/21/2009   Epigastric pain    Essential hypertension 01/23/2008   Gastro-esophageal reflux disease without esophagitis 01/23/2008   Generalized anxiety disorder 01/23/2008   Headache 04/15/2007   Hidradenitis 10/04/2008   History of colonic polyps    Hyperlipidemia    Hypokalemia 07/23/2018   Insomnia 01/23/2008   Major depressive disorder 01/23/2008   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 Not on AC PTA. Pharmacy consulted to dose heparin.   Initial heparin level upper end therapeutic on 1000 units/hr  Goal of Therapy:  Heparin level 0.3-0.7 units/ml Monitor platelets by anticoagulation protocol: Yes   Plan:  Slight decrease to 950 units/hr Daily heparin level, CBC, s/s bleeding  Bertis Ruddy, PharmD, [facility] Clinical Pharmacist ED Pharmacist Phone # [phone] 07/04/2022 10:57 PM

## 2022-07-05 ENCOUNTER — Telehealth (HOSPITAL_BASED_OUTPATIENT_CLINIC_OR_DEPARTMENT_OTHER): Payer: Self-pay | Admitting: Pulmonary Disease

## 2022-07-05 ENCOUNTER — Ambulatory Visit (HOSPITAL_COMMUNITY)
Admission: EM | Disposition: A | Discharge status: Home / Self Care | Payer: Self-pay | Source: Home / Self Care | Attending: Emergency Medicine

## 2022-07-05 ENCOUNTER — Ambulatory Visit (HOSPITAL_COMMUNITY): Admission: RE | Admit: 2022-07-05 | Payer: PPO | Source: Home / Self Care | Admitting: Cardiology

## 2022-07-05 DIAGNOSIS — R072 Precordial pain: Secondary | ICD-10-CM

## 2022-07-05 DIAGNOSIS — E669 Obesity, unspecified: Secondary | ICD-10-CM | POA: Diagnosis not present

## 2022-07-05 DIAGNOSIS — G4733 Obstructive sleep apnea (adult) (pediatric): Secondary | ICD-10-CM

## 2022-07-05 DIAGNOSIS — I2510 Atherosclerotic heart disease of native coronary artery without angina pectoris: Secondary | ICD-10-CM | POA: Diagnosis not present

## 2022-07-05 DIAGNOSIS — F411 Generalized anxiety disorder: Secondary | ICD-10-CM | POA: Diagnosis not present

## 2022-07-05 DIAGNOSIS — D649 Anemia, unspecified: Secondary | ICD-10-CM | POA: Insufficient documentation

## 2022-07-05 DIAGNOSIS — I708 Atherosclerosis of other arteries: Secondary | ICD-10-CM | POA: Diagnosis not present

## 2022-07-05 DIAGNOSIS — I700 Atherosclerosis of aorta: Secondary | ICD-10-CM | POA: Diagnosis not present

## 2022-07-05 DIAGNOSIS — J849 Interstitial pulmonary disease, unspecified: Secondary | ICD-10-CM | POA: Diagnosis not present

## 2022-07-05 DIAGNOSIS — J432 Centrilobular emphysema: Secondary | ICD-10-CM | POA: Diagnosis not present

## 2022-07-05 HISTORY — PX: LEFT HEART CATH AND CORONARY ANGIOGRAPHY: CATH118249

## 2022-07-05 LAB — RAPID URINE DRUG SCREEN, HOSP PERFORMED
Amphetamines: NOT DETECTED
Barbiturates: NOT DETECTED
Benzodiazepines: POSITIVE — AB
Cocaine: NOT DETECTED
Opiates: NOT DETECTED
Tetrahydrocannabinol: NOT DETECTED

## 2022-07-05 LAB — HEPARIN LEVEL (UNFRACTIONATED): Heparin Unfractionated: 0.52 IU/mL (ref 0.30–0.70)

## 2022-07-05 LAB — CBC
HCT: 33.3 % — ABNORMAL LOW (ref 36.0–46.0)
Hemoglobin: 10.8 g/dL — ABNORMAL LOW (ref 12.0–15.0)
MCH: 30.1 pg (ref 26.0–34.0)
MCHC: 32.4 g/dL (ref 30.0–36.0)
MCV: 92.8 fL (ref 80.0–100.0)
Platelets: 145 10*3/uL — ABNORMAL LOW (ref 150–400)
RBC: 3.59 MIL/uL — ABNORMAL LOW (ref 3.87–5.11)
RDW: 14.6 % (ref 11.5–15.5)
WBC: 5.5 10*3/uL (ref 4.0–10.5)
nRBC: 0 % (ref 0.0–0.2)

## 2022-07-05 LAB — LIPID PANEL
Cholesterol: 145 mg/dL (ref 0–200)
HDL: 63 mg/dL (ref >40)
LDL Cholesterol: 68 mg/dL (ref 0–99)
Total CHOL/HDL Ratio: 2.3 RATIO
Triglycerides: 70 mg/dL (ref <150)
VLDL: 14 mg/dL (ref 0–40)

## 2022-07-05 LAB — HIV ANTIBODY (ROUTINE TESTING W REFLEX): HIV Screen 4th Generation wRfx: NONREACTIVE

## 2022-07-05 SURGERY — LEFT HEART CATH AND CORONARY ANGIOGRAPHY
Anesthesia: LOCAL

## 2022-07-05 MED ORDER — HEPARIN (PORCINE) IN NACL 1000-0.9 UT/500ML-% IV SOLN
INTRAVENOUS | Status: DC | PRN
  Administered 2022-07-05 (×2): 500 mL

## 2022-07-05 MED ORDER — LIDOCAINE HCL (PF) 1 % IJ SOLN
INTRAMUSCULAR | Status: DC | PRN
  Administered 2022-07-05: 2 mL

## 2022-07-05 MED ORDER — MIDAZOLAM HCL 2 MG/2ML IJ SOLN
INTRAMUSCULAR | Status: DC | PRN
  Administered 2022-07-05: 1 mg via INTRAVENOUS

## 2022-07-05 MED ORDER — FENTANYL CITRATE (PF) 100 MCG/2ML IJ SOLN
INTRAMUSCULAR | Status: AC
  Filled 2022-07-05: qty 2

## 2022-07-05 MED ORDER — MIDAZOLAM HCL 2 MG/2ML IJ SOLN
INTRAMUSCULAR | Status: AC
  Filled 2022-07-05: qty 2

## 2022-07-05 MED ORDER — SODIUM CHLORIDE 0.9% FLUSH: 3.0000 mL | Freq: Two times a day (BID) | INTRAVENOUS | Status: DC

## 2022-07-05 MED ORDER — IOHEXOL 350 MG/ML SOLN
INTRAVENOUS | Status: DC | PRN
  Administered 2022-07-05: 40 mL via INTRA_ARTERIAL

## 2022-07-05 MED ORDER — VERAPAMIL HCL 2.5 MG/ML IV SOLN: INTRAVENOUS | Status: DC | PRN

## 2022-07-05 MED ORDER — SODIUM CHLORIDE 0.9 % WEIGHT BASED INFUSION: 1.0000 mL/kg/h | INTRAVENOUS | Status: DC

## 2022-07-05 MED ORDER — VERAPAMIL HCL 2.5 MG/ML IV SOLN
INTRAVENOUS | Status: AC
  Filled 2022-07-05: qty 2

## 2022-07-05 MED ORDER — ASPIRIN 81 MG PO CHEW: 81.0000 mg | CHEWABLE_TABLET | ORAL | Status: DC

## 2022-07-05 MED ORDER — SODIUM CHLORIDE 0.9 % WEIGHT BASED INFUSION
1.0000 mL/kg/h | INTRAVENOUS | Status: DC
  Administered 2022-07-05: 1 mL/kg/h via INTRAVENOUS

## 2022-07-05 MED ORDER — HEPARIN SODIUM (PORCINE) 1000 UNIT/ML IJ SOLN
INTRAMUSCULAR | Status: AC
  Filled 2022-07-05: qty 10

## 2022-07-05 MED ORDER — HEPARIN (PORCINE) IN NACL 1000-0.9 UT/500ML-% IV SOLN
INTRAVENOUS | Status: AC
  Filled 2022-07-05: qty 1000

## 2022-07-05 MED ORDER — FENTANYL CITRATE (PF) 100 MCG/2ML IJ SOLN: INTRAMUSCULAR | Status: DC | PRN

## 2022-07-05 MED ORDER — SODIUM CHLORIDE 0.9 % WEIGHT BASED INFUSION: 3.0000 mL/kg/h | INTRAVENOUS | Status: DC

## 2022-07-05 MED ORDER — SODIUM CHLORIDE 0.9 % WEIGHT BASED INFUSION
3.0000 mL/kg/h | INTRAVENOUS | Status: AC
  Administered 2022-07-05: 3 mL/kg/h via INTRAVENOUS

## 2022-07-05 MED ORDER — ASPIRIN 81 MG PO CHEW
81.0000 mg | CHEWABLE_TABLET | ORAL | Status: AC
  Administered 2022-07-05: 81 mg via ORAL
  Filled 2022-07-05: qty 1

## 2022-07-05 MED ORDER — LIDOCAINE HCL (PF) 1 % IJ SOLN
INTRAMUSCULAR | Status: AC
  Filled 2022-07-05: qty 30

## 2022-07-05 MED ORDER — LEVALBUTEROL HCL 0.63 MG/3ML IN NEBU: 0.6300 mg | INHALATION_SOLUTION | Freq: Four times a day (QID) | RESPIRATORY_TRACT | Status: DC | PRN

## 2022-07-05 MED ORDER — HEPARIN SODIUM (PORCINE) 1000 UNIT/ML IJ SOLN
INTRAMUSCULAR | Status: DC | PRN
  Administered 2022-07-05: 4500 [IU] via INTRAVENOUS

## 2022-07-05 MED ORDER — SODIUM CHLORIDE 0.9% FLUSH: 3.0000 mL | INTRAVENOUS | Status: DC | PRN

## 2022-07-05 MED ORDER — SODIUM CHLORIDE 0.9 % IV SOLN: 250.0000 mL | INTRAVENOUS | Status: DC | PRN

## 2022-07-05 SURGICAL SUPPLY — 10 items
CATH 5FR JL3.5 JR4 ANG PIG MP (CATHETERS) IMPLANT
DEVICE RAD COMP TR BAND LRG (VASCULAR PRODUCTS) IMPLANT
GLIDESHEATH SLEND SS 6F .021 (SHEATH) IMPLANT
GUIDEWIRE INQWIRE 1.5J.035X260 (WIRE) IMPLANT
INQWIRE 1.5J .035X260CM (WIRE) ×1
KIT HEART LEFT (KITS) ×2 IMPLANT
PACK CARDIAC CATHETERIZATION (CUSTOM PROCEDURE TRAY) ×2 IMPLANT
TRANSDUCER W/STOPCOCK (MISCELLANEOUS) ×2 IMPLANT
TUBING CIL FLEX 10 FLL-RA (TUBING) ×2 IMPLANT
WIRE HI TORQ VERSACORE-J 145CM (WIRE) IMPLANT

## 2022-07-05 NOTE — ED Notes (Signed)
Pt placed on 2L [place] at this time.

## 2022-07-05 NOTE — Discharge Instructions (Signed)

## 2022-07-05 NOTE — Interval H&P Note (Signed)
History and Physical Interval Note:  07/05/2022 10:39 AM  [name] [name]  has presented today for surgery, with the diagnosis of chest pain.  The various methods of treatment have been discussed with the patient and family. After consideration of risks, benefits and other options for treatment, the patient has consented to  Procedure(s): LEFT HEART CATH AND CORONARY ANGIOGRAPHY (N/A) as a surgical intervention.  The patient's history has been reviewed, patient examined, no change in status, stable for surgery.  I have reviewed the patient's chart and labs.  Questions were answered to the patient's satisfaction.    Cath Lab Visit (complete for each Cath Lab visit)  Clinical Evaluation Leading to the Procedure:   ACS: Yes.    Non-ACS:    Anginal Classification: CCS IV  Anti-ischemic medical therapy: No Therapy  Non-Invasive Test Results: No non-invasive testing performed  Prior CABG: No previous CABG       Collier Salina [facility] 07/05/2022 10:39 AM

## 2022-07-05 NOTE — Progress Notes (Signed)
TR BAND REMOVAL  LOCATION:    right radial  DEFLATED PER PROTOCOL:    Yes.    TIME BAND OFF / DRESSING APPLIED:    1345 gauze dressing applied    SITE UPON ARRIVAL:    Level 0  SITE AFTER BAND REMOVAL:    Level 0  CIRCULATION SENSATION AND MOVEMENT:    Within Normal Limits   Yes.    COMMENTS:   no issues noted

## 2022-07-05 NOTE — ED Notes (Signed)
Took over pt care at this time.

## 2022-07-05 NOTE — H&P (View-Only) (Signed)
 Rounding Note    Patient Name: [name] [name] Date of Encounter: 07/05/2022  [facility] Cardiologist: None   Subjective   Patient denies chest pain. Has some back pain that has been chronic. Believes it is from laying in bed for so long. Reports being uncomfortable in the ER due to issues with purewick   Inpatient Medications    Scheduled Meds:  aspirin  81 mg Oral Pre-Cath   aspirin EC  81 mg Oral Daily   FLUoxetine  40 mg Oral Daily   pantoprazole  40 mg Oral Daily   rosuvastatin  40 mg Oral Daily   Continuous Infusions:  sodium chloride 100 mL/hr at 07/04/22 2118   sodium chloride     heparin 950 Units/hr (07/04/22 2312)   PRN Meds: acetaminophen, albuterol, alprazolam, nitroGLYCERIN, ondansetron (ZOFRAN) IV, oxyCODONE   Vital Signs    Vitals:   07/05/22 0451 07/05/22 0550 07/05/22 0600 07/05/22 0723  BP:  126/68 108/66 112/76  Pulse:  69 66 65  Resp:  12 13 13  Temp: 99 F (37.2 C)   97.8 F (36.6 C)  TempSrc: Oral   Oral  SpO2:  98% 99% 99%  Weight:      Height:       No intake or output data in the 24 hours ending 07/05/22 0800    07/04/2022    9:19 PM 07/04/2022    2:17 PM 06/25/2022    3:44 PM  Last 3 Weights  Weight (lbs) 182 lb 187 lb 12.8 oz 191 lb 9.6 oz  Weight (kg) 82.555 kg 85.186 kg 86.909 kg      Telemetry    NSR - Personally Reviewed  ECG    Sinus rhythm, HR 73 BPM - Personally Reviewed  Physical Exam   GEN: No acute distress. Laying flat in the bed  Neck: No JVD Cardiac: RRR, no murmurs, rubs, or gallops. Radial pulses 2+bilaterally  Respiratory: Fine crackles throughout all lung fields  GI: Soft, nontender, non-distended  MS: No edema in BLE; No deformity. Neuro:  Nonfocal  Psych: Normal affect   Labs    High Sensitivity Troponin:   Recent Labs  Lab 07/04/22 1600 07/04/22 1941  TROPONINIHS <2 <2     Chemistry Recent Labs  Lab 07/04/22 1600 07/04/22 2011  NA 137 138  K 3.7 3.6  CL 102  --    CO2 26  --   GLUCOSE 98  --   BUN 24*  --   CREATININE 0.75  --   CALCIUM 8.9  --   PROT 7.0  --   ALBUMIN 3.8  --   AST 33  --   ALT 22  --   ALKPHOS 48  --   BILITOT 0.5  --   GFRNONAA >60  --   ANIONGAP 9  --     Lipids  Recent Labs  Lab 07/05/22 0446  CHOL 145  TRIG 70  HDL 63  LDLCALC 68  CHOLHDL 2.3    Hematology Recent Labs  Lab 07/04/22 1600 07/04/22 2011 07/05/22 0446  WBC 7.7  --  5.5  RBC 3.83*  --  3.59*  HGB 11.6* 11.9* 10.8*  HCT 35.6* 35.0* 33.3*  MCV 93.0  --  92.8  MCH 30.3  --  30.1  MCHC 32.6  --  32.4  RDW 14.6  --  14.6  PLT 158  --  145*   Thyroid No results for input(s): "TSH", "FREET4" in the last   168 hours.  BNP Recent Labs  Lab 07/04/22 1600  BNP 5.4    DDimer No results for input(s): "DDIMER" in the last 168 hours.   Radiology    CT Angio Chest/Abd/Pel for Dissection W and/or Wo Contrast  Result Date: 07/04/2022 CLINICAL DATA:  CP radiating to back, sob, decrease left DP pulse EXAM: CT ANGIOGRAPHY CHEST, ABDOMEN AND PELVIS TECHNIQUE: Non-contrast CT of the chest was initially obtained. Multidetector CT imaging through the chest, abdomen and pelvis was performed using the standard protocol during bolus administration of intravenous contrast. Multiplanar reconstructed images and MIPs were obtained and reviewed to evaluate the vascular anatomy. RADIATION DOSE REDUCTION: This exam was performed according to the departmental dose-optimization program which includes automated exposure control, adjustment of the mA and/or kV according to patient size and/or use of iterative reconstruction technique. CONTRAST:  133mL OMNIPAQUE IOHEXOL 350 MG/ML SOLN COMPARISON:  CT chest high-resolution 01/25/2022, CT angio chest 11/30/2021, CT abdomen pelvis 12/05/2021 FINDINGS: CTA CHEST FINDINGS Cardiovascular: Preferential opacification of the thoracic aorta. No evidence of thoracic aortic aneurysm or dissection. Normal heart size. No significant  pericardial effusion. Mild-to-moderate atherosclerotic plaque of the thoracic aorta. Four-vessel coronary artery calcifications. Mediastinum/Nodes: No enlarged mediastinal, hilar, or axillary lymph nodes. Thyroid gland, trachea, and esophagus demonstrate no significant findings. Lungs/Pleura: Peripheral reticulations and cystic changes-findings better evaluated on CT chest 01/25/2022. No focal consolidation. No pulmonary nodule. No pulmonary mass. No pleural effusion. No pneumothorax. Musculoskeletal: No chest wall abnormality. Chronic appearing calcification versus suture material along the right lower anterolateral chest (7:102). No suspicious lytic or blastic osseous lesions. No acute displaced fracture. Review of the MIP images confirms the above findings. CTA ABDOMEN AND PELVIS FINDINGS VASCULAR Aorta: Severe atherosclerotic plaque. Normal caliber aorta without aneurysm, dissection, vasculitis or significant stenosis. Celiac: Mild atherosclerotic plaque. Patent without evidence of aneurysm, dissection, vasculitis or significant stenosis. SMA: Mild atherosclerotic plaque. Patent without evidence of aneurysm, dissection, vasculitis or significant stenosis. Renals: Mild atherosclerotic plaque. Both renal arteries are patent without evidence of aneurysm, dissection, vasculitis, fibromuscular dysplasia or significant stenosis. IMA: Patent without evidence of aneurysm, dissection, vasculitis or significant stenosis. Inflow: Moderate to severe narrowing of the left common iliac artery lumen down to a caliber of 4 x 6 mm. Patent without evidence of aneurysm, dissection, vasculitis or significant stenosis. Veins: No obvious venous abnormality within the limitations of this arterial phase study. Review of the MIP images confirms the above findings. NON-VASCULAR Hepatobiliary: No focal liver abnormality. No gallstones, gallbladder wall thickening, or pericholecystic fluid. No biliary dilatation. Pancreas: No focal lesion.  Normal pancreatic contour. No surrounding inflammatory changes. No main pancreatic ductal dilatation. Spleen: Normal in size without focal abnormality. Adrenals/Urinary Tract: No adrenal nodule bilaterally. Bilateral kidneys enhance symmetrically. No hydronephrosis. No hydroureter. The urinary bladder is unremarkable. Stomach/Bowel: Stomach is within normal limits. No evidence of bowel wall thickening or dilatation. Appendix appears normal. Lymphatic: No lymphadenopathy. Reproductive: Status post hysterectomy. No adnexal masses. Other: No intraperitoneal free fluid. No intraperitoneal free gas. No organized fluid collection. Musculoskeletal: No abdominal wall hernia or abnormality. No suspicious lytic or blastic osseous lesions. No acute displaced fracture. Multilevel degenerative changes of the spine. Review of the MIP images confirms the above findings. IMPRESSION: 1. No acute aortic abnormality. 2. Aortic Atherosclerosis (ICD10-I70.0) -severe with moderate to severe narrowing of the left common iliac artery lumen down to a caliber of 4 x 6 mm. 3. No acute nonvascular intrathoracic, intra-abdominal, intrapelvic abnormality. 4. Pulmonary fibrosis-better evaluated on CT chest high-resolution 01/25/2022. Electronically  Signed   By: Iven Finn M.D.   On: 07/04/2022 18:25   ECHOCARDIOGRAM COMPLETE  Result Date: 07/04/2022    ECHOCARDIOGRAM REPORT   Patient Name:   SAVANAH BAYLES Brafford Date of Exam: 07/04/2022 Medical Rec #:  [id number]          Height:       62.0 in Accession #:    [id number]         Weight:       187.8 lb Date of Birth:  December 15, 1955         BSA:          1.861 m Patient Age:    60 years           BP:           117/64 mmHg Patient Gender: F                  HR:           70 bpm. Exam Location:  Inpatient Procedure: 2D Echo Indications:    chest pain  History:        Patient has no prior history of Echocardiogram examinations.                 Signs/Symptoms:Edema; Risk Factors:Sleep Apnea,  Hypertension and                 Dyslipidemia.  Sonographer:    Johny Chess RDCS Referring Phys: [id number] [place]  1. Left ventricular ejection fraction, by estimation, is 60 to 65%. The left ventricle has normal function. The left ventricle has no regional wall motion abnormalities. Left ventricular diastolic parameters were normal.  2. Right ventricular systolic function is normal. The right ventricular size is normal.  3. The mitral valve is normal in structure. No evidence of mitral valve regurgitation. No evidence of mitral stenosis.  4. The aortic valve has an indeterminant number of cusps. Aortic valve regurgitation is not visualized. No aortic stenosis is present.  5. The inferior vena cava is normal in size with greater than 50% respiratory variability, suggesting right atrial pressure of 3 mmHg. FINDINGS  Left Ventricle: Left ventricular ejection fraction, by estimation, is 60 to 65%. The left ventricle has normal function. The left ventricle has no regional wall motion abnormalities. The left ventricular internal cavity size was normal in size. There is  no left ventricular hypertrophy. Left ventricular diastolic parameters were normal. Right Ventricle: The right ventricular size is normal. Right ventricular systolic function is normal. Left Atrium: Left atrial size was normal in size. Right Atrium: Right atrial size was normal in size. Pericardium: There is no evidence of pericardial effusion. Mitral Valve: The mitral valve is normal in structure. No evidence of mitral valve regurgitation. No evidence of mitral valve stenosis. Tricuspid Valve: The tricuspid valve is normal in structure. Tricuspid valve regurgitation is not demonstrated. No evidence of tricuspid stenosis. Aortic Valve: The aortic valve has an indeterminant number of cusps. Aortic valve regurgitation is not visualized. No aortic stenosis is present. Pulmonic Valve: The pulmonic valve was normal in structure. Pulmonic  valve regurgitation is not visualized. No evidence of pulmonic stenosis. Aorta: The aortic root is normal in size and structure. Venous: The inferior vena cava is normal in size with greater than 50% respiratory variability, suggesting right atrial pressure of 3 mmHg. IAS/Shunts: No atrial level shunt detected by color flow Doppler.  LEFT VENTRICLE PLAX 2D LVIDd:  3.60 cm   Diastology LVIDs:         2.40 cm   LV e' medial:    8.16 cm/s LV PW:         1.00 cm   LV E/e' medial:  8.8 LV IVS:        0.90 cm   LV e' lateral:   8.59 cm/s LVOT diam:     1.80 cm   LV E/e' lateral: 8.4 LV SV:         61 LV SV Index:   33 LVOT Area:     2.54 cm  RIGHT VENTRICLE            IVC RV Basal diam:  2.70 cm    IVC diam: 1.10 cm RV S prime:     9.68 cm/s TAPSE (M-mode): 1.8 cm LEFT ATRIUM             Index        RIGHT ATRIUM           Index LA diam:        2.70 cm 1.45 cm/m   RA Area:     11.00 cm LA Vol (A2C):   29.1 ml 15.63 ml/m  RA Volume:   22.70 ml  12.20 ml/m LA Vol (A4C):   20.4 ml 10.96 ml/m LA Biplane Vol: 24.9 ml 13.38 ml/m  AORTIC VALVE LVOT Vmax:   125.00 cm/s LVOT Vmean:  75.300 cm/s LVOT VTI:    0.238 m  AORTA Ao Root diam: 2.60 cm Ao Asc diam:  3.10 cm MITRAL VALVE MV Area (PHT): 3.21 cm    SHUNTS MV Decel Time: 236 msec    Systemic VTI:  0.24 m MV E velocity: 72.20 cm/s  Systemic Diam: 1.80 cm MV A velocity: 89.50 cm/s MV E/A ratio:  0.81 Brian Crenshaw MD Electronically signed by Brian Crenshaw MD Signature Date/Time: 07/04/2022/5:25:09 PM    Final    DG Chest Portable 1 View  Result Date: 07/04/2022 CLINICAL DATA:  Chest pain and shortness of breath EXAM: PORTABLE CHEST 1 VIEW COMPARISON:  Chest x-ray dated November 30, 2021; chest CT dated June 30, 2022 FINDINGS: Cardiac and mediastinal contours are within normal limits. Right chest wall generator pack with single lead extending superiorly outside the field of view. Emphysema. No consolidation. No pleural effusion or pneumothorax. IMPRESSION: No  active disease. Electronically Signed   By: Leah  Strickland M.D.   On: 07/04/2022 16:44    Cardiac Studies   Echocardiogram 07/04/22 1. Left ventricular ejection fraction, by estimation, is 60 to 65%. The  left ventricle has normal function. The left ventricle has no regional  wall motion abnormalities. Left ventricular diastolic parameters were  normal.   2. Right ventricular systolic function is normal. The right ventricular  size is normal.   3. The mitral valve is normal in structure. No evidence of mitral valve  regurgitation. No evidence of mitral stenosis.   4. The aortic valve has an indeterminant number of cusps. Aortic valve  regurgitation is not visualized. No aortic stenosis is present.   5. The inferior vena cava is normal in size with greater than 50%  respiratory variability, suggesting right atrial pressure of 3 mmHg.   Patient Profile     66 y.o. female with a past medical history of emphysema, interstitial lung disease, OSA with Inspire implant, anxiety, ADHD, bipolar disorder, HTN, HLD, obesity. Patient was seen yesterday by Dr. Turner for an outpatient OSA follow up.   At that appointment, patient complained of worsening SOB and chest pain. She was sent to the ED for further evaluation. Admitted by the [facility] service, cardiology consulting for evaluation of chest pain. Planned for cardiac catheterization today   Assessment & Plan    Chest Pain/Unstable Angina CAD on Chest CT  - Patient seen in office by Dr. Mayford Knife yesterday. Complained of chest pain, worsening SOB. Recent CT chest in 06/2022 showed three-vessel coronary atherosclerosis. Sent to the ED for further evaluation  - IV heparin was started on arrival to the ED due to concerns of ACS - hsTn in the ED negative x2. Stopped heparin this AM  - Patient scheduled for left heart catheterization this AM. Has been NPO since midnight  - Continue daily ASA  - Continue crestor 40 mg daily  - Echo this admission showed EF  60-65%, no regional wall motion abnormalities, normal RV systolic function  - Further recommendations pending cath   Otherwise per primary - Emphysema, ILD - Anxiety   For questions or updates, please contact [facility] Please consult [email or web address] for contact info under        Signed, Jonita Albee, PA-C  07/05/2022, 8:00 AM    Personally seen and examined. Agree with above.  67 year old patient of Dr. Norris Cross here with chest pain, known multivessel coronary atherosclerosis/calcification seen on prior CT scans with excessive fatigue.  Thankfully troponins were normal.  We will go ahead and proceed with left heart catheterization.  Risks and benefits been explained including stroke heart attack death renal impairment bleeding.  She is currently NPO.  Echocardiogram showed reassuring pump function 65%.  Lab work reviewed.  Anxiety noted.  Chest is regular rate and rhythm normal lung sounds.  If catheterization is reassuring, discharge later today.  Donato Schultz, MD

## 2022-07-05 NOTE — Progress Notes (Addendum)
Rounding Note    Patient Name: [name] [name] Date of Encounter: 07/05/2022  [facility] Cardiologist: None   Subjective   Patient denies chest pain. Has some back pain that has been chronic. Believes it is from laying in bed for so long. Reports being uncomfortable in the ER due to issues with purewick   Inpatient Medications    Scheduled Meds:  aspirin  81 mg Oral Pre-Cath   aspirin EC  81 mg Oral Daily   FLUoxetine  40 mg Oral Daily   pantoprazole  40 mg Oral Daily   rosuvastatin  40 mg Oral Daily   Continuous Infusions:  sodium chloride 100 mL/hr at 07/04/22 2118   sodium chloride     heparin 950 Units/hr (07/04/22 2312)   PRN Meds: acetaminophen, albuterol, alprazolam, nitroGLYCERIN, ondansetron (ZOFRAN) IV, oxyCODONE   Vital Signs    Vitals:   07/05/22 0451 07/05/22 0550 07/05/22 0600 07/05/22 0723  BP:  126/68 108/66 112/76  Pulse:  69 66 65  Resp:  12 13 13   Temp: 99 F (37.2 C)   97.8 F (36.6 C)  TempSrc: Oral   Oral  SpO2:  98% 99% 99%  Weight:      Height:       No intake or output data in the 24 hours ending 07/05/22 0800    07/04/2022    9:19 PM 07/04/2022    2:17 PM 06/25/2022    3:44 PM  Last 3 Weights  Weight (lbs) 182 lb 187 lb 12.8 oz 191 lb 9.6 oz  Weight (kg) 82.555 kg 85.186 kg 86.909 kg      Telemetry    NSR - Personally Reviewed  ECG    Sinus rhythm, HR 73 BPM - Personally Reviewed  Physical Exam   GEN: No acute distress. Laying flat in the bed  Neck: No JVD Cardiac: RRR, no murmurs, rubs, or gallops. Radial pulses 2+bilaterally  Respiratory: Fine crackles throughout all lung fields  GI: Soft, nontender, non-distended  MS: No edema in BLE; No deformity. Neuro:  Nonfocal  Psych: Normal affect   Labs    High Sensitivity Troponin:   Recent Labs  Lab 07/04/22 1600 07/04/22 1941  TROPONINIHS <2 <2     Chemistry Recent Labs  Lab 07/04/22 1600 07/04/22 2011  NA 137 138  K 3.7 3.6  CL 102  --    CO2 26  --   GLUCOSE 98  --   BUN 24*  --   CREATININE 0.75  --   CALCIUM 8.9  --   PROT 7.0  --   ALBUMIN 3.8  --   AST 33  --   ALT 22  --   ALKPHOS 48  --   BILITOT 0.5  --   GFRNONAA >60  --   ANIONGAP 9  --     Lipids  Recent Labs  Lab 07/05/22 0446  CHOL 145  TRIG 70  HDL 63  LDLCALC 68  CHOLHDL 2.3    Hematology Recent Labs  Lab 07/04/22 1600 07/04/22 2011 07/05/22 0446  WBC 7.7  --  5.5  RBC 3.83*  --  3.59*  HGB 11.6* 11.9* 10.8*  HCT 35.6* 35.0* 33.3*  MCV 93.0  --  92.8  MCH 30.3  --  30.1  MCHC 32.6  --  32.4  RDW 14.6  --  14.6  PLT 158  --  145*   Thyroid No results for input(s): "TSH", "FREET4" in the last  168 hours.  BNP Recent Labs  Lab 07/04/22 1600  BNP 5.4    DDimer No results for input(s): "DDIMER" in the last 168 hours.   Radiology    CT Angio Chest/Abd/Pel for Dissection W and/or Wo Contrast  Result Date: 07/04/2022 CLINICAL DATA:  CP radiating to back, sob, decrease left DP pulse EXAM: CT ANGIOGRAPHY CHEST, ABDOMEN AND PELVIS TECHNIQUE: Non-contrast CT of the chest was initially obtained. Multidetector CT imaging through the chest, abdomen and pelvis was performed using the standard protocol during bolus administration of intravenous contrast. Multiplanar reconstructed images and MIPs were obtained and reviewed to evaluate the vascular anatomy. RADIATION DOSE REDUCTION: This exam was performed according to the departmental dose-optimization program which includes automated exposure control, adjustment of the mA and/or kV according to patient size and/or use of iterative reconstruction technique. CONTRAST:  133mL OMNIPAQUE IOHEXOL 350 MG/ML SOLN COMPARISON:  CT chest high-resolution 01/25/2022, CT angio chest 11/30/2021, CT abdomen pelvis 12/05/2021 FINDINGS: CTA CHEST FINDINGS Cardiovascular: Preferential opacification of the thoracic aorta. No evidence of thoracic aortic aneurysm or dissection. Normal heart size. No significant  pericardial effusion. Mild-to-moderate atherosclerotic plaque of the thoracic aorta. Four-vessel coronary artery calcifications. Mediastinum/Nodes: No enlarged mediastinal, hilar, or axillary lymph nodes. Thyroid gland, trachea, and esophagus demonstrate no significant findings. Lungs/Pleura: Peripheral reticulations and cystic changes-findings better evaluated on CT chest 01/25/2022. No focal consolidation. No pulmonary nodule. No pulmonary mass. No pleural effusion. No pneumothorax. Musculoskeletal: No chest wall abnormality. Chronic appearing calcification versus suture material along the right lower anterolateral chest (7:102). No suspicious lytic or blastic osseous lesions. No acute displaced fracture. Review of the MIP images confirms the above findings. CTA ABDOMEN AND PELVIS FINDINGS VASCULAR Aorta: Severe atherosclerotic plaque. Normal caliber aorta without aneurysm, dissection, vasculitis or significant stenosis. Celiac: Mild atherosclerotic plaque. Patent without evidence of aneurysm, dissection, vasculitis or significant stenosis. SMA: Mild atherosclerotic plaque. Patent without evidence of aneurysm, dissection, vasculitis or significant stenosis. Renals: Mild atherosclerotic plaque. Both renal arteries are patent without evidence of aneurysm, dissection, vasculitis, fibromuscular dysplasia or significant stenosis. IMA: Patent without evidence of aneurysm, dissection, vasculitis or significant stenosis. Inflow: Moderate to severe narrowing of the left common iliac artery lumen down to a caliber of 4 x 6 mm. Patent without evidence of aneurysm, dissection, vasculitis or significant stenosis. Veins: No obvious venous abnormality within the limitations of this arterial phase study. Review of the MIP images confirms the above findings. NON-VASCULAR Hepatobiliary: No focal liver abnormality. No gallstones, gallbladder wall thickening, or pericholecystic fluid. No biliary dilatation. Pancreas: No focal lesion.  Normal pancreatic contour. No surrounding inflammatory changes. No main pancreatic ductal dilatation. Spleen: Normal in size without focal abnormality. Adrenals/Urinary Tract: No adrenal nodule bilaterally. Bilateral kidneys enhance symmetrically. No hydronephrosis. No hydroureter. The urinary bladder is unremarkable. Stomach/Bowel: Stomach is within normal limits. No evidence of bowel wall thickening or dilatation. Appendix appears normal. Lymphatic: No lymphadenopathy. Reproductive: Status post hysterectomy. No adnexal masses. Other: No intraperitoneal free fluid. No intraperitoneal free gas. No organized fluid collection. Musculoskeletal: No abdominal wall hernia or abnormality. No suspicious lytic or blastic osseous lesions. No acute displaced fracture. Multilevel degenerative changes of the spine. Review of the MIP images confirms the above findings. IMPRESSION: 1. No acute aortic abnormality. 2. Aortic Atherosclerosis (ICD10-I70.0) -severe with moderate to severe narrowing of the left common iliac artery lumen down to a caliber of 4 x 6 mm. 3. No acute nonvascular intrathoracic, intra-abdominal, intrapelvic abnormality. 4. Pulmonary fibrosis-better evaluated on CT chest high-resolution 01/25/2022. Electronically  Signed   By: Iven Finn M.D.   On: 07/04/2022 18:25   ECHOCARDIOGRAM COMPLETE  Result Date: 07/04/2022    ECHOCARDIOGRAM REPORT   Patient Name:   SAVANAH BAYLES Brafford Date of Exam: 07/04/2022 Medical Rec #:  [id number]          Height:       62.0 in Accession #:    [id number]         Weight:       187.8 lb Date of Birth:  December 15, 1955         BSA:          1.861 m Patient Age:    60 years           BP:           117/64 mmHg Patient Gender: F                  HR:           70 bpm. Exam Location:  Inpatient Procedure: 2D Echo Indications:    chest pain  History:        Patient has no prior history of Echocardiogram examinations.                 Signs/Symptoms:Edema; Risk Factors:Sleep Apnea,  Hypertension and                 Dyslipidemia.  Sonographer:    Johny Chess RDCS Referring Phys: [id number] Nelson  1. Left ventricular ejection fraction, by estimation, is 60 to 65%. The left ventricle has normal function. The left ventricle has no regional wall motion abnormalities. Left ventricular diastolic parameters were normal.  2. Right ventricular systolic function is normal. The right ventricular size is normal.  3. The mitral valve is normal in structure. No evidence of mitral valve regurgitation. No evidence of mitral stenosis.  4. The aortic valve has an indeterminant number of cusps. Aortic valve regurgitation is not visualized. No aortic stenosis is present.  5. The inferior vena cava is normal in size with greater than 50% respiratory variability, suggesting right atrial pressure of 3 mmHg. FINDINGS  Left Ventricle: Left ventricular ejection fraction, by estimation, is 60 to 65%. The left ventricle has normal function. The left ventricle has no regional wall motion abnormalities. The left ventricular internal cavity size was normal in size. There is  no left ventricular hypertrophy. Left ventricular diastolic parameters were normal. Right Ventricle: The right ventricular size is normal. Right ventricular systolic function is normal. Left Atrium: Left atrial size was normal in size. Right Atrium: Right atrial size was normal in size. Pericardium: There is no evidence of pericardial effusion. Mitral Valve: The mitral valve is normal in structure. No evidence of mitral valve regurgitation. No evidence of mitral valve stenosis. Tricuspid Valve: The tricuspid valve is normal in structure. Tricuspid valve regurgitation is not demonstrated. No evidence of tricuspid stenosis. Aortic Valve: The aortic valve has an indeterminant number of cusps. Aortic valve regurgitation is not visualized. No aortic stenosis is present. Pulmonic Valve: The pulmonic valve was normal in structure. Pulmonic  valve regurgitation is not visualized. No evidence of pulmonic stenosis. Aorta: The aortic root is normal in size and structure. Venous: The inferior vena cava is normal in size with greater than 50% respiratory variability, suggesting right atrial pressure of 3 mmHg. IAS/Shunts: No atrial level shunt detected by color flow Doppler.  LEFT VENTRICLE PLAX 2D LVIDd:  3.60 cm   Diastology LVIDs:         2.40 cm   LV e' medial:    8.16 cm/s LV PW:         1.00 cm   LV E/e' medial:  8.8 LV IVS:        0.90 cm   LV e' lateral:   8.59 cm/s LVOT diam:     1.80 cm   LV E/e' lateral: 8.4 LV SV:         61 LV SV Index:   33 LVOT Area:     2.54 cm  RIGHT VENTRICLE            IVC RV Basal diam:  2.70 cm    IVC diam: 1.10 cm RV S prime:     9.68 cm/s TAPSE (M-mode): 1.8 cm LEFT ATRIUM             Index        RIGHT ATRIUM           Index LA diam:        2.70 cm 1.45 cm/m   RA Area:     11.00 cm LA Vol (A2C):   29.1 ml 15.63 ml/m  RA Volume:   22.70 ml  12.20 ml/m LA Vol (A4C):   20.4 ml 10.96 ml/m LA Biplane Vol: 24.9 ml 13.38 ml/m  AORTIC VALVE LVOT Vmax:   125.00 cm/s LVOT Vmean:  75.300 cm/s LVOT VTI:    0.238 m  AORTA Ao Root diam: 2.60 cm Ao Asc diam:  3.10 cm MITRAL VALVE MV Area (PHT): 3.21 cm    SHUNTS MV Decel Time: 236 msec    Systemic VTI:  0.24 m MV E velocity: 72.20 cm/s  Systemic Diam: 1.80 cm MV A velocity: 89.50 cm/s MV E/A ratio:  0.81 Olga Millers MD Electronically signed by Olga Millers MD Signature Date/Time: 07/04/2022/5:25:09 PM    Final    DG Chest Portable 1 View  Result Date: 07/04/2022 CLINICAL DATA:  Chest pain and shortness of breath EXAM: PORTABLE CHEST 1 VIEW COMPARISON:  Chest x-ray dated November 30, 2021; chest CT dated June 30, 2022 FINDINGS: Cardiac and mediastinal contours are within normal limits. Right chest wall generator pack with single lead extending superiorly outside the field of view. Emphysema. No consolidation. No pleural effusion or pneumothorax. IMPRESSION: No  active disease. Electronically Signed   By: Allegra Lai M.D.   On: 07/04/2022 16:44    Cardiac Studies   Echocardiogram 07/04/22 1. Left ventricular ejection fraction, by estimation, is 60 to 65%. The  left ventricle has normal function. The left ventricle has no regional  wall motion abnormalities. Left ventricular diastolic parameters were  normal.   2. Right ventricular systolic function is normal. The right ventricular  size is normal.   3. The mitral valve is normal in structure. No evidence of mitral valve  regurgitation. No evidence of mitral stenosis.   4. The aortic valve has an indeterminant number of cusps. Aortic valve  regurgitation is not visualized. No aortic stenosis is present.   5. The inferior vena cava is normal in size with greater than 50%  respiratory variability, suggesting right atrial pressure of 3 mmHg.   Patient Profile     67 y.o. female with a past medical history of emphysema, interstitial lung disease, OSA with Inspire implant, anxiety, ADHD, bipolar disorder, HTN, HLD, obesity. Patient was seen yesterday by Dr. Mayford Knife for an outpatient OSA follow up.  At that appointment, patient complained of worsening SOB and chest pain. She was sent to the ED for further evaluation. Admitted by the [facility] service, cardiology consulting for evaluation of chest pain. Planned for cardiac catheterization today   Assessment & Plan    Chest Pain/Unstable Angina CAD on Chest CT  - Patient seen in office by Dr. Mayford Knife yesterday. Complained of chest pain, worsening SOB. Recent CT chest in 06/2022 showed three-vessel coronary atherosclerosis. Sent to the ED for further evaluation  - IV heparin was started on arrival to the ED due to concerns of ACS - hsTn in the ED negative x2. Stopped heparin this AM  - Patient scheduled for left heart catheterization this AM. Has been NPO since midnight  - Continue daily ASA  - Continue crestor 40 mg daily  - Echo this admission showed EF  60-65%, no regional wall motion abnormalities, normal RV systolic function  - Further recommendations pending cath   Otherwise per primary - Emphysema, ILD - Anxiety   For questions or updates, please contact [facility] Please consult [email or web address] for contact info under        Signed, Jonita Albee, PA-C  07/05/2022, 8:00 AM    Personally seen and examined. Agree with above.  67 year old patient of Dr. Norris Cross here with chest pain, known multivessel coronary atherosclerosis/calcification seen on prior CT scans with excessive fatigue.  Thankfully troponins were normal.  We will go ahead and proceed with left heart catheterization.  Risks and benefits been explained including stroke heart attack death renal impairment bleeding.  She is currently NPO.  Echocardiogram showed reassuring pump function 65%.  Lab work reviewed.  Anxiety noted.  Chest is regular rate and rhythm normal lung sounds.  If catheterization is reassuring, discharge later today.  Donato Schultz, MD

## 2022-07-05 NOTE — Discharge Summary (Signed)
Physician Discharge Summary  [name] [name] UPJ:[id number] DOB: January 04, 1956 DOA: 07/04/2022  PCP: Nelwyn Salisbury, MD  Admit date: 07/04/2022 Discharge date: 07/05/2022 Admitted From: Home Disposition: Home Recommendations for Outpatient Follow-up:  Follow up with PCP in in 1 to 2 weeks or sooner if needed Cardiology to arrange outpatient follow-up Check BMP and CBC at follow-up Follow-up with vascular surgery about aortoiliac atherosclerosis Patient with significant anxiety.  She is on Xanax 2 mg 3 times daily and high risk for withdrawal Please follow up on the following pending results: None  Home Health: None Equipment/Devices: None  Discharge Condition: Stable CODE STATUS: Full code  Follow-up Information     Nelwyn Salisbury, MD. Schedule an appointment as soon as possible for a visit in 1 week(s).   Specialty: Family Medicine Contact information: [address] [place] [address] [phone]         [name], MD Follow up on 07/19/2022.   Specialty: Cardiology Why: Video Visit at 10 AM Contact information: [address] [place] [address] [phone]                 Hospital course 67 year old F with PMH of emphysema, ILD, OSA with inspire implant, anxiety, obesity, chronic back pain and PUD presenting from cardiology office with worsening dyspnea, DOE, and chest pain, and admitted for a ACS rule out.  In ED, stable vitals.  Afebrile.  EKG sinus rhythm without acute ischemic finding.  Serial troponin and BNP negative.  CBC and chemistry without significant finding.  CXR and CTA chest without acute finding but severe aortic sclerosis with moderate to severe narrowing of the left common iliac artery lumen down to the caliber of 4 x 6 mm.  Patient was started on IV heparin.    The next day, symptoms resolved.  She underwent left heart catheterization which showed mild nonobstructive CAD.  She is cleared for discharge by  cardiology for outpatient follow-up.  She is already on aspirin, high intensity statin and Zetia.   Patient appears to have significant anxiety probably contributing to her symptoms.  See individual problem list below for more.   Problems addressed during this hospitalization Principal Problem:   Precordial chest pain Active Problems:   Generalized anxiety disorder   OSA (obstructive sleep apnea)   Obesity (BMI 30-39.9)   ILD (interstitial lung disease) (HCC)   Centrilobular emphysema (HCC)   Aorto-iliac atherosclerosis (HCC)   Normocytic anemia   Atypical chest pain/dyspnea/dyspnea on exertion: ACS ruled out.  She appears very anxious which might be contributing.  She is on significant dose of Xanax and would be at risk of withdrawal anxiety.  Severe aortic atherosclerosis with left common iliac artery stenosis: Recent ABI in 03/2022 without significant finding.  She had significant smoking history -Continue aspirin, Crestor and Zetia -Consider referral to vascular surgery outpatient  Generalized anxiety disorder: Appears very anxious.  She is on Xanax 2 mg 3 times daily as needed.  High risk for polypharmacy and withdrawal anxiety.  May consider long-acting benzodiazepines if she cannot come off Xanax.  May consider adding SSRI and buspirone if has not tried  Other medical conditions stable.  Vital signs Vitals:   07/05/22 1208 07/05/22 1221 07/05/22 1236 07/05/22 1251  BP: (!) 146/67 (!) 148/65 (!) 137/57 109/81  Pulse: 67 68 68 64  Temp:      Resp:      Height:      Weight:  SpO2: 96% 96% 95% 96%  TempSrc:      BMI (Calculated):         Discharge exam  GENERAL: No apparent distress.  Nontoxic. HEENT: MMM.  Vision and hearing grossly intact.  NECK: Supple.  No apparent JVD.  RESP:  No IWOB.  Fair aeration bilaterally. CVS:  RRR. Heart sounds normal.  ABD/GI/GU: BS+. Abd soft, NTND.  MSK/EXT:  Moves extremities. No apparent deformity. No edema.  SKIN: no  apparent skin lesion or wound NEURO: Awake and alert. Oriented appropriately.  No apparent focal neuro deficit. PSYCH: Appears anxious.  Discharge Instructions Discharge Instructions     Call MD for:  difficulty breathing, headache or visual disturbances   Complete by: As directed    Call MD for:  extreme fatigue   Complete by: As directed    Call MD for:  persistant dizziness or light-headedness   Complete by: As directed    Diet - low sodium heart healthy   Complete by: As directed    Discharge instructions   Complete by: As directed    It has been a pleasure taking care of you!  You were hospitalized due to chest pain and shortness of breath with activity.  Your heart catheterization did not show heart attack (blockage of blood vessels in your heart).  Follow instruction by cardiologist about your heart.  Note that you CT abdomen showed some narrowing of blood vessels to your left leg due to atherosclerosis.  Continue taking your Crestor and aspirin.  You may ask your primary care doctor for referral to vascular surgery for further evaluation.   Take care,   Increase activity slowly   Complete by: As directed       Allergies as of 07/05/2022       Reactions   Sulfonamide Derivatives Swelling   Facial swelling   Flexeril [cyclobenzaprine] Other (See Comments)   Dizziness, made meniere's disease worse   Hydromorphone Itching   Morphine And Related Nausea And Vomiting   Sulfamethoxazole Other (See Comments)   Causes swelling         Medication List     TAKE these medications    acyclovir 400 MG tablet Commonly known as: ZOVIRAX TAKE 1 TABLET BY MOUTH 4 TIMES A DAY AS NEEDED What changed: See the new instructions.   albuterol (2.5 MG/3ML) 0.083% nebulizer solution Commonly known as: PROVENTIL Take 3 mLs (2.5 mg total) by nebulization every 6 (six) hours as needed for wheezing or shortness of breath.   alprazolam 2 MG tablet Commonly known as: XANAX TAKE 1  TABLET BY MOUTH 3 TIMES DAILY AS NEEDED FOR ANXIETY.   aspirin EC 81 MG tablet Take 81 mg by mouth daily. Swallow whole.   B-COMPLEX/VITAMIN C PO Take 1 tablet by mouth daily.   ezetimibe 10 MG tablet Commonly known as: ZETIA Take 10 mg by mouth daily.   FLUoxetine 40 MG capsule Commonly known as: PROZAC TAKE 1 CAPSULE (40 MG TOTAL) BY MOUTH DAILY.   Magnesium 500 MG Caps Take 500 mg by mouth daily.   omeprazole 20 MG capsule Commonly known as: PRILOSEC TAKE 1 CAPSULE BY MOUTH 2 TIMES DAILY BEFORE A MEAL. What changed: See the new instructions.   polyethylene glycol 17 g packet Commonly known as: MIRALAX / GLYCOLAX Use one packet daily What changed:  how much to take how to take this when to take this reasons to take this additional instructions   potassium chloride 10 MEQ tablet Commonly known  as: KLOR-CON Take 1 tablet (10 mEq total) by mouth daily.   rosuvastatin 40 MG tablet Commonly known as: CRESTOR TAKE 1 TABLET (40 MG TOTAL) BY MOUTH DAILY.   triamterene-hydrochlorothiazide 37.5-25 MG capsule Commonly known as: DYAZIDE Take 1 capsule by mouth every morning.   Vitamin D3 125 MCG (5000 UT) Caps Take 5,000 Units by mouth once a week.        Consultations: Cardiology  Procedures/Studies: 1/18-left heart catheterization with mild nonobstructive CAD.   CARDIAC CATHETERIZATION  Result Date: 07/05/2022   Prox LAD to Mid LAD lesion is 20% stenosed.   Prox RCA lesion is 40% stenosed.   LV end diastolic pressure is mildly elevated. Nonobstructive CAD Mildly elevated LVEDP 25 mm Hg Plan: risk factor modification. Anticipate same day DC.   CT Angio Chest/Abd/Pel for Dissection W and/or Wo Contrast  Result Date: 07/04/2022 CLINICAL DATA:  CP radiating to back, sob, decrease left DP pulse EXAM: CT ANGIOGRAPHY CHEST, ABDOMEN AND PELVIS TECHNIQUE: Non-contrast CT of the chest was initially obtained. Multidetector CT imaging through the chest, abdomen and  pelvis was performed using the standard protocol during bolus administration of intravenous contrast. Multiplanar reconstructed images and MIPs were obtained and reviewed to evaluate the vascular anatomy. RADIATION DOSE REDUCTION: This exam was performed according to the departmental dose-optimization program which includes automated exposure control, adjustment of the mA and/or kV according to patient size and/or use of iterative reconstruction technique. CONTRAST:  OMNIPAQUE IOHEXOL 350 MG/ML SOLN COMPARISON:  CT chest high-resolution 01/25/2022, CT angio chest 11/30/2021, CT abdomen pelvis 12/05/2021 FINDINGS: CTA CHEST FINDINGS Cardiovascular: Preferential opacification of the thoracic aorta. No evidence of thoracic aortic aneurysm or dissection. Normal heart size. No significant pericardial effusion. Mild-to-moderate atherosclerotic plaque of the thoracic aorta. Four-vessel coronary artery calcifications. Mediastinum/Nodes: No enlarged mediastinal, hilar, or axillary lymph nodes. Thyroid gland, trachea, and esophagus demonstrate no significant findings. Lungs/Pleura: Peripheral reticulations and cystic changes-findings better evaluated on CT chest 01/25/2022. No focal consolidation. No pulmonary nodule. No pulmonary mass. No pleural effusion. No pneumothorax. Musculoskeletal: No chest wall abnormality. Chronic appearing calcification versus suture material along the right lower anterolateral chest (7:102). No suspicious lytic or blastic osseous lesions. No acute displaced fracture. Review of the MIP images confirms the above findings. CTA ABDOMEN AND PELVIS FINDINGS VASCULAR Aorta: Severe atherosclerotic plaque. Normal caliber aorta without aneurysm, dissection, vasculitis or significant stenosis. Celiac: Mild atherosclerotic plaque. Patent without evidence of aneurysm, dissection, vasculitis or significant stenosis. SMA: Mild atherosclerotic plaque. Patent without evidence of aneurysm, dissection,  vasculitis or significant stenosis. Renals: Mild atherosclerotic plaque. Both renal arteries are patent without evidence of aneurysm, dissection, vasculitis, fibromuscular dysplasia or significant stenosis. IMA: Patent without evidence of aneurysm, dissection, vasculitis or significant stenosis. Inflow: Moderate to severe narrowing of the left common iliac artery lumen down to a caliber of 4 x 6 mm. Patent without evidence of aneurysm, dissection, vasculitis or significant stenosis. Veins: No obvious venous abnormality within the limitations of this arterial phase study. Review of the MIP images confirms the above findings. NON-VASCULAR Hepatobiliary: No focal liver abnormality. No gallstones, gallbladder wall thickening, or pericholecystic fluid. No biliary dilatation. Pancreas: No focal lesion. Normal pancreatic contour. No surrounding inflammatory changes. No main pancreatic ductal dilatation. Spleen: Normal in size without focal abnormality. Adrenals/Urinary Tract: No adrenal nodule bilaterally. Bilateral kidneys enhance symmetrically. No hydronephrosis. No hydroureter. The urinary bladder is unremarkable. Stomach/Bowel: Stomach is within normal limits. No evidence of bowel wall thickening or dilatation. Appendix appears normal. Lymphatic: No lymphadenopathy.  Reproductive: Status post hysterectomy. No adnexal masses. Other: No intraperitoneal free fluid. No intraperitoneal free gas. No organized fluid collection. Musculoskeletal: No abdominal wall hernia or abnormality. No suspicious lytic or blastic osseous lesions. No acute displaced fracture. Multilevel degenerative changes of the spine. Review of the MIP images confirms the above findings. IMPRESSION: 1. No acute aortic abnormality. 2. Aortic Atherosclerosis (ICD10-I70.0) -severe with moderate to severe narrowing of the left common iliac artery lumen down to a caliber of 4 x 6 mm. 3. No acute nonvascular intrathoracic, intra-abdominal, intrapelvic  abnormality. 4. Pulmonary fibrosis-better evaluated on CT chest high-resolution 01/25/2022. Electronically Signed   By: Tish FredericksonMorgane  Naveau M.D.   On: 07/04/2022 18:25   ECHOCARDIOGRAM COMPLETE  Result Date: 07/04/2022    ECHOCARDIOGRAM REPORT   Patient Name:   [name] [name] Date of Exam: 07/04/2022 Medical Rec #:  [id number]          Height:       62.0 in Accession #:    [id number]         Weight:       187.8 lb Date of Birth:  Jan 17, 1956         BSA:          1.861 m Patient Age:    66 years           BP:           117/64 mmHg Patient Gender: F                  HR:           70 bpm. Exam Location:  Inpatient Procedure: 2D Echo Indications:    chest pain  History:        Patient has no prior history of Echocardiogram examinations.                 Signs/Symptoms:Edema; Risk Factors:Sleep Apnea, Hypertension and                 Dyslipidemia.  Sonographer:    Delcie RochLauren Pennington RDCS Referring Phys: [id number] Perlie GoldEVAN WILLIAMS IMPRESSIONS  1. Left ventricular ejection fraction, by estimation, is 60 to 65%. The left ventricle has normal function. The left ventricle has no regional wall motion abnormalities. Left ventricular diastolic parameters were normal.  2. Right ventricular systolic function is normal. The right ventricular size is normal.  3. The mitral valve is normal in structure. No evidence of mitral valve regurgitation. No evidence of mitral stenosis.  4. The aortic valve has an indeterminant number of cusps. Aortic valve regurgitation is not visualized. No aortic stenosis is present.  5. The inferior vena cava is normal in size with greater than 50% respiratory variability, suggesting right atrial pressure of 3 mmHg. FINDINGS  Left Ventricle: Left ventricular ejection fraction, by estimation, is 60 to 65%. The left ventricle has normal function. The left ventricle has no regional wall motion abnormalities. The left ventricular internal cavity size was normal in size. There is  no left ventricular hypertrophy.  Left ventricular diastolic parameters were normal. Right Ventricle: The right ventricular size is normal. Right ventricular systolic function is normal. Left Atrium: Left atrial size was normal in size. Right Atrium: Right atrial size was normal in size. Pericardium: There is no evidence of pericardial effusion. Mitral Valve: The mitral valve is normal in structure. No evidence of mitral valve regurgitation. No evidence of mitral valve stenosis. Tricuspid Valve: The tricuspid valve is normal in structure. Tricuspid valve regurgitation is not  demonstrated. No evidence of tricuspid stenosis. Aortic Valve: The aortic valve has an indeterminant number of cusps. Aortic valve regurgitation is not visualized. No aortic stenosis is present. Pulmonic Valve: The pulmonic valve was normal in structure. Pulmonic valve regurgitation is not visualized. No evidence of pulmonic stenosis. Aorta: The aortic root is normal in size and structure. Venous: The inferior vena cava is normal in size with greater than 50% respiratory variability, suggesting right atrial pressure of 3 mmHg. IAS/Shunts: No atrial level shunt detected by color flow Doppler.  LEFT VENTRICLE PLAX 2D LVIDd:         3.60 cm   Diastology LVIDs:         2.40 cm   LV e' medial:    8.16 cm/s LV PW:         1.00 cm   LV E/e' medial:  8.8 LV IVS:        0.90 cm   LV e' lateral:   8.59 cm/s LVOT diam:     1.80 cm   LV E/e' lateral: 8.4 LV SV:         61 LV SV Index:   33 LVOT Area:     2.54 cm  RIGHT VENTRICLE            IVC RV Basal diam:  2.70 cm    IVC diam: 1.10 cm RV S prime:     9.68 cm/s TAPSE (M-mode): 1.8 cm LEFT ATRIUM             Index        RIGHT ATRIUM           Index LA diam:        2.70 cm 1.45 cm/m   RA Area:     11.00 cm LA Vol (A2C):   29.1 ml 15.63 ml/m  RA Volume:   22.70 ml  12.20 ml/m LA Vol (A4C):   20.4 ml 10.96 ml/m LA Biplane Vol: 24.9 ml 13.38 ml/m  AORTIC VALVE LVOT Vmax:   125.00 cm/s LVOT Vmean:  75.300 cm/s LVOT VTI:    0.238 m   AORTA Ao Root diam: 2.60 cm Ao Asc diam:  3.10 cm MITRAL VALVE MV Area (PHT): 3.21 cm    SHUNTS MV Decel Time: 236 msec    Systemic VTI:  0.24 m MV E velocity: 72.20 cm/s  Systemic Diam: 1.80 cm MV A velocity: 89.50 cm/s MV E/A ratio:  0.81 Kirk Ruths MD Electronically signed by Kirk Ruths MD Signature Date/Time: 07/04/2022/5:25:09 PM    Final    DG Chest Portable 1 View  Result Date: 07/04/2022 CLINICAL DATA:  Chest pain and shortness of breath EXAM: PORTABLE CHEST 1 VIEW COMPARISON:  Chest x-ray dated November 30, 2021; chest CT dated June 30, 2022 FINDINGS: Cardiac and mediastinal contours are within normal limits. Right chest wall generator pack with single lead extending superiorly outside the field of view. Emphysema. No consolidation. No pleural effusion or pneumothorax. IMPRESSION: No active disease. Electronically Signed   By: Yetta Glassman M.D.   On: 07/04/2022 16:44   CT Chest High Resolution  Result Date: 07/01/2022 CLINICAL DATA:  Dyspnea.  Follow-up interstitial lung disease. EXAM: CT CHEST WITHOUT CONTRAST TECHNIQUE: Multidetector CT imaging of the chest was performed following the standard protocol without intravenous contrast. High resolution imaging of the lungs, as well as inspiratory and expiratory imaging, was performed. RADIATION DOSE REDUCTION: This exam was performed according to the departmental dose-optimization program which includes automated exposure  control, adjustment of the mA and/or kV according to patient size and/or use of iterative reconstruction technique. COMPARISON:  01/25/2022 high-resolution chest CT. FINDINGS: Cardiovascular: Normal heart size. No significant pericardial effusion/thickening. Three-vessel coronary atherosclerosis. Atherosclerotic nonaneurysmal thoracic aorta. Normal caliber pulmonary arteries. Mediastinum/Nodes: No significant thyroid nodules. Unremarkable esophagus. No pathologically enlarged axillary, mediastinal or hilar lymph nodes,  noting limited sensitivity for the detection of hilar adenopathy on this noncontrast study. Lungs/Pleura: No pneumothorax. No pleural effusion. Solid subpleural anterior left upper lobe 1.3 x 0.8 cm pulmonary nodule, previously 1.3 x 0.8 cm on 01/25/2022 CT and 06/01/2021 CT using similar measurement technique, stable and presumably benign. Chronic lead arising from the subcutaneous stimulator device in the ventral high right chest wall terminates at the periphery of the right middle lobe, unchanged. No acute consolidative airspace disease, lung masses or new significant pulmonary nodules. No significant lobular air trapping or evidence of tracheobronchomalacia on the expiration sequence. Mild-to-moderate centrilobular and paraseptal emphysema with mild diffuse bronchial wall thickening. Mild to moderate patchy confluent subpleural reticulation and ground-glass opacity throughout both lungs with associated mild traction bronchiectasis and architectural distortion. No clear apicobasilar gradient to these findings. No frank honeycombing. No appreciable interval progression since 01/25/2022 CT or 06/01/2021 CT. As noted on the prior scan, there is evidence of progression at the lung bases since 05/28/2016 CT abdomen study. Upper abdomen: No acute abnormality. Musculoskeletal: No aggressive appearing focal osseous lesions. Partially visualized surgical hardware from ACDF. Moderate thoracic spondylosis. IMPRESSION: 1. Spectrum of findings compatible with mild-to-moderate fibrotic interstitial lung disease without frank honeycombing and without clear apicobasilar gradient, without appreciable interval progression since 01/25/2022 CT or 06/01/2021 CT. As noted on the prior scan, there is evidence of progression at the lung bases since 05/28/2016 CT abdomen study. 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 Am Rosezetta SchlatterJ Respir Crit Care Med Vol 198, Iss 5, [phone], Feb 16 2017. 2. Solid 1.3 cm anterior left upper lobe pulmonary nodule, stable since 06/01/2021 CT, presumably benign. 3. Three-vessel coronary atherosclerosis. 4. Aortic Atherosclerosis (ICD10-I70.0) and Emphysema (ICD10-J43.9). Electronically Signed   By: Delbert PhenixJason A Poff M.D.   On: 07/01/2022 21:24       The results of significant diagnostics from this hospitalization (including imaging, microbiology, ancillary and laboratory) are listed below for reference.     Microbiology: No results found for this or any previous visit (from the past 240 hour(s)).   Labs:  CBC: Recent Labs  Lab 07/04/22 1600 07/04/22 2011 07/05/22 0446  WBC 7.7  --  5.5  NEUTROABS 5.2  --   --   HGB 11.6* 11.9* 10.8*  HCT 35.6* 35.0* 33.3*  MCV 93.0  --  92.8  PLT 158  --  145*   BMP &GFR Recent Labs  Lab 07/04/22 1600 07/04/22 2011  NA 137 138  K 3.7 3.6  CL 102  --   CO2 26  --   GLUCOSE 98  --   BUN 24*  --   CREATININE 0.75  --   CALCIUM 8.9  --    Estimated Creatinine Clearance: 68 mL/min (by C-G formula based on SCr of 0.75 mg/dL). Liver & Pancreas: Recent Labs  Lab 07/04/22 1600  AST 33  ALT 22  ALKPHOS 48  BILITOT 0.5  PROT 7.0  ALBUMIN 3.8   No results for input(s): "LIPASE", "AMYLASE" in the last 168 hours. Recent Labs  Lab 07/04/22 1941  AMMONIA 30   Diabetic: No results for input(s): "HGBA1C" in the last 72 hours. Recent Labs  Lab 07/04/22 1821  GLUCAP 103*   Cardiac Enzymes: No results for input(s): "CKTOTAL", "CKMB", "CKMBINDEX", "TROPONINI" in the last 168 hours. No results for input(s): "PROBNP" in the last 8760 hours. Coagulation Profile: No results for input(s): "INR", "PROTIME" in the last 168 hours. Thyroid Function Tests: No results for input(s): "TSH", "T4TOTAL", "FREET4", "T3FREE", "THYROIDAB" in the last 72 hours. Lipid Profile: Recent Labs    07/05/22 0446  CHOL 145   HDL 63  LDLCALC 68  TRIG 70  CHOLHDL 2.3   Anemia Panel: No results for input(s): "VITAMINB12", "FOLATE", "FERRITIN", "TIBC", "IRON", "RETICCTPCT" in the last 72 hours. Urine analysis:    Component Value Date/Time   COLORURINE YELLOW 11/30/2021 1230   APPEARANCEUR CLEAR 11/30/2021 1230   LABSPEC 1.030 11/30/2021 1230   PHURINE 5.0 11/30/2021 1230   GLUCOSEU NEGATIVE 11/30/2021 1230   HGBUR NEGATIVE 11/30/2021 1230   HGBUR negative 05/10/2009 0946   BILIRUBINUR NEGATIVE 11/30/2021 1230   BILIRUBINUR n 06/30/2018 1456   KETONESUR NEGATIVE 11/30/2021 1230   PROTEINUR NEGATIVE 11/30/2021 1230   UROBILINOGEN 0.2 06/30/2018 1456   UROBILINOGEN 0.2 01/03/2015 1050   NITRITE NEGATIVE 11/30/2021 1230   LEUKOCYTESUR NEGATIVE 11/30/2021 1230   Sepsis Labs: Invalid input(s): "PROCALCITONIN", "LACTICIDVEN"   SIGNED:  Mercy Riding, MD  [facility] 07/05/2022, 3:26 PM

## 2022-07-05 NOTE — ED Notes (Signed)
Pt denies CP, but reports a tightness that is uncomfortable. Pt c/o low back pain which she reports she thinks is d/t the stretcher.

## 2022-07-05 NOTE — Plan of Care (Signed)
   Cardiac catheterization on 1/18 showed nonobstructive CAD. Per Dr. Doug Sou recommendations, we will continue risk factor modification. Continue daily ASA 81 mg, crestor 40 mg daily. Patient has been on ezetimibe 10 mg daily, but reports that she gets a headache every time she takes it. She would like to stop taking it due to side effects. Ok to stop taking ezetimibe.   Patient seen after cardiac catheterization. Right radial cath site stable.   Discussed radial cath site care and provided written copy in her discharge instructions. All questions answered. Patient has a video visit on 2/1 with Dr. Radford Pax. OK for discharge once TR band removed.   Margie Billet, PA-C 07/05/2022 1:13 PM

## 2022-07-06 ENCOUNTER — Encounter (HOSPITAL_COMMUNITY): Payer: Self-pay | Admitting: Cardiology

## 2022-07-06 MED FILL — Fentanyl Citrate Preservative Free (PF) Inj 100 MCG/2ML: INTRAMUSCULAR | Qty: 2 | Status: AC

## 2022-07-06 NOTE — Patient Outreach (Signed)
  Care Coordination Late Entry  Follow Up Visit Note   Outreach completed 07/03/22 Name: [name] [name] MRN: [id number] DOB: [birth year]  [name] [name] is a 67 y.o. year old female who sees Laurey Morale, MD for primary care. I spoke with  [name] [name] by phone today.  What matters to the patients health and wellness today?  Chronic Health Conditions    Goals Addressed             This Visit's Progress    Management of MH Conditions   On track    Care Coordination Interventions: Solution-Focused Strategies employed:  Mindfulness or Relaxation training provided Active listening / Reflection utilized  Emotional Support Provided Provided psychoeducation for mental health needs  Caregiver stress acknowledged  Verbalization of feelings encouraged  LCSW spoke with patient and spouse, [name] [name] Family provided LCSW with an update on management of chronic health conditions. Upcoming appts with specialists discussed  Patient has implemented breathing tx and has a nebulizer, to assist. She has discontinued tramadol and gabapentin "weeks ago" [name] [name] has made adjustments and pt's sleep has improved Healthy coping skills discussed              SDOH assessments and interventions completed:  No     Care Coordination Interventions:  Yes, provided   Follow up plan: Follow up call scheduled for 4-6 weeks    Encounter Outcome:  Pt. Visit Completed   Christa See, MSW, [email or web address] Phone [phone] 5:06 PM

## 2022-07-06 NOTE — Patient Instructions (Signed)
Visit Information  Thank you for taking time to visit with me today. Please don't hesitate to contact me if I can be of assistance to you.   Following are the goals we discussed today:   Goals Addressed             This Visit's Progress    Management of MH Conditions   On track    Care Coordination Interventions: Solution-Focused Strategies employed:  Mindfulness or Relaxation training provided Active listening / Reflection utilized  Emotional Support Provided Provided psychoeducation for mental health needs  Caregiver stress acknowledged  Verbalization of feelings encouraged  LCSW spoke with patient and spouse, Jenny Reichmann Family provided LCSW with an update on management of chronic health conditions. Upcoming appts with specialists discussed  Patient has implemented breathing tx and has a nebulizer, to assist. She has discontinued tramadol and gabapentin "weeks ago" Dawna Part has made adjustments and pt's sleep has improved Healthy coping skills discussed              Our next appointment is by telephone on 08/16/22 at 11 AM  Please call the care guide team at [phone] if you need to cancel or reschedule your appointment.   If you are experiencing a Mental Health or [place] or need someone to talk to, please call the Suicide and Crisis Lifeline: 988 call 911   Patient verbalizes understanding of instructions and care plan provided today and agrees to view in [place]. Active MyChart status and patient understanding of how to access instructions and care plan via MyChart confirmed with patient.     Christa See, MSW, [email or web address] Phone [phone] 5:07 PM

## 2022-07-06 NOTE — Telephone Encounter (Signed)
Pt was just in the hospital and had a heart cath performed by cardiology. No other procedure scheduled by our office.

## 2022-07-09 NOTE — Telephone Encounter (Signed)
Called and spoke with pt and spouse asking how often pt was having to use the albuterol neb sol. Pt said she had been having to use it 4-5 times a day. Pt's spouse stated that pt was given samples of Trelegy at last OV for pt to try.   When pt uses the albuterol neb sol, it will end up giving pt the shakes. Due to this, they want to know if there is anything different that pt might be able to be put on instead.  Dr. Elsworth Soho, please advise.

## 2022-07-10 ENCOUNTER — Telehealth: Payer: Self-pay | Admitting: Pulmonary Disease

## 2022-07-10 DIAGNOSIS — M47816 Spondylosis without myelopathy or radiculopathy, lumbar region: Secondary | ICD-10-CM | POA: Diagnosis not present

## 2022-07-10 MED ORDER — TRELEGY ELLIPTA 100-62.5-25 MCG/ACT IN AEPB: 1.0000 | INHALATION_SPRAY | Freq: Every day | RESPIRATORY_TRACT | 5 refills | Status: DC

## 2022-07-10 NOTE — Telephone Encounter (Signed)
Called and spoke with pt's spouse letting him know info per Dr. Elsworth Soho and he verbalized understanding. Pt needed Rx for Trelegy and Rx has been sent to pharmacy for pt. Nothing further needed.

## 2022-07-10 NOTE — Telephone Encounter (Signed)
Called and spoke with Crystal from [employer] who stated they received clinical notes on pt but do not know what to do with them. Found phone note that stated that clinical notes were needed to show why pt was needing a nebulizer. Crystal stated that she would get the notes over to pharmacy. Nothing further needed.

## 2022-07-18 NOTE — Progress Notes (Unsigned)
SLEEP MEDICINE VIRTUAL VISIT      Virtual Visit via Video Note   Because of [name] [name]'s co-morbid illnesses, she is at least at moderate risk for complications without adequate follow up.  This format is felt to be most appropriate for this patient at this time.  All issues noted in this document were discussed and addressed.  A limited physical exam was performed with this format.  Please refer to the patient's chart for her consent to telehealth for [facility].       Date:  07/19/2022   ID:  [name], DOB [birth year], MRN [id number] The patient was identified using 2 identifiers.  Patient Location: Home Provider Location: Home Office   PCP:  Laurey Morale, MD   [place] Providers Cardiologist:  None     Evaluation Performed:  Follow-Up Visit  Chief Complaint:  OSA  History of Present Illness:    [name] [name] is a 67 y.o. female with a history of ADHD, bipolar disorder, hypertension, GERD, hyperlipidemia, obesity.  She has a hx of OSA on CPAP therapy.  Unfortunately she was intolerant to CPAP and requested ENT evaluation of  Inspire. Device.  She underwent hypoglossal nerve stimulator implant by Dr. Redmond Baseman on 03/25/2019.     She was seen back on 05/01/2019 for device activation.  She was then seen in the office on 10/06/2019 and was complaining of feeling very sleepy and felt that her device need to be reprogrammed.  She was having a lot of daytime sleepiness as well as snoring.  Nerve stimulation was performed and her amplitude was increased from 1.7 to 2.5 V.  Her control range was changed to 2.3 to 3.3 V.  A few weeks later she had an in lab study done 04/30/2020 showed which showed no significant obstructive sleep apnea using her inspire device.     I then saw her back 07/31/2021 and she was doing great with her inspire device and was on level 5 which was 2.7 V.  She was feeling rested when she get up in the morning and had no daytime  sleepiness.  She had no tongue sensitivity and her husband said she was very lightly snoring.  At that time an Itamar sleep study was ordered which showed moderate obstructive sleep apnea with an AHI of 17.9/h and O2 sats as low as 81%.   She is now brought back into the office for further evaluation due to an increase in her AHI.  At last office visit she was had really struggling with her inspire device and has not been able to sleep at night.  She was having a severe sore throat when she would wake up up in the morning and felt like her tongue was all the way out of her mouth all night and very painful in the morning.   At that office visit  interrogation of her device was performed and showed that her thresholds had decreased to 1 V and it was likely that she had had a decrease in stimulation threshold over time due to healing and did not require a high pacing threshold any longer and was resulting in overstimulation of the hypoglossal nerve which was changing her anatomy  resulting in more apneas. Her parameters were changed with the stimulation range of 1.4 V to 1.8 V.  Her goal was to get to level 2 or 3 which would be 1.5 to 1.6 V.   She underwent Itamar sleep study 11/23/21  to make sure that her apneas were stable on current inspire settings.  This showed mild obstructive sleep apnea with an AHI 7.3/h   She was seen again 11.14.2023 due to recurrent snoring again even when she was on her side.  She had gained over 15 lbs since May.  She was on level 5 which is 1.8 volts.  She also had been more sleepy than usual.   She has been placed on Gabapentin that has caused weight gain. She also was just dx with ILD and emphysema. The gabapentin makes her sleepy as well. Her voltage was increased but that resulted in significant protrusion of her tongue with angulation to the left.  She was kept at level 5 (1.8V).  She underwent Itamar HST which showed very mild OSA with an AHI of 9/hr and no central events.     Office visit today 06/07/2022 her device was interrogated.  She was at level 5 which was 1.8 V and testing at this voltage showed prominent left-sided tongue deviation. Suspected that this was distorting anatomy and causing her  to actually have more apneas and resulting in more snoring.  Her device was reprogrammed after testing her tongue motion at multiple levels.  1.4 V showed good tongue motion.  Her device was reprogrammed with a range of 1.2 to 1.6 V.  Her optimal level will be level 3 which is 1.4 V.  She is now presenting for followup.  She is doing well and is currently on level 3 which is 1.4V.  She is sleeping 7 hours nightly and feeling good when she gets up in the am.  She occasionally she will wake up during the night but over the past month she has slept through the night. She does not have any sleepiness during the day.  She has not been snoring any.    Past Medical History:  Diagnosis Date   Allergic rhinitis 01/23/2008   Angina pectoris 06/06/2016   Arthritis    Attention deficit hyperactivity disorder (ADHD) 02/07/2016   Back pain, lumbar 01/21/2009   Bell's palsy 02/06/2008   Bipolar disorder 12/13/2009   Chronic idiopathic constipation    Dizziness 04/16/2008   Edema, leg 01/21/2009   Epigastric pain    Essential hypertension 01/23/2008   Gastro-esophageal reflux disease without esophagitis 01/23/2008   Generalized anxiety disorder 01/23/2008   Headache 04/15/2007   Hidradenitis 10/04/2008   History of colonic polyps    Hyperlipidemia    Hypokalemia 07/23/2018   Insomnia 01/23/2008   Major depressive disorder 01/23/2008   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   Neck pain 05/06/2017   Obesity (BMI 30-39.9) 11/10/2015   OSA (obstructive sleep apnea) 04/15/2007   inspire implant; does not always remember to turn device on   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 Wynetta Emery   BREAST BIOPSY Right 1981   benign   BREAST EXCISIONAL BIOPSY Right 1981   benign   scar does not show   BREAST SURGERY Bilateral 12/11/2012   reductions per Dr. Ivin Booty in [facility]    CATARACT EXTRACTION, BILATERAL Bilateral last summer of 2018   at [facility]   COLONOSCOPY  03/07/2021  per Dr. Loreta Ave, adenomatous polyps, repeat in 7 yrs   DRUG INDUCED ENDOSCOPY N/A 01/30/2019   Procedure: DRUG INDUCED ENDOSCOPY;  Surgeon: Christia Reading, MD;  Location: [facility];  Service: ENT;  Laterality: N/A;   ESOPHAGOGASTRODUODENOSCOPY  04/23/2011   per Dr. Loreta Ave, normal    EYE SURGERY  06/18/2016   L eyelid    IMPLANTATION OF HYPOGLOSSAL NERVE STIMULATOR Right 03/25/2019   Procedure: IMPLANTATION OF HYPOGLOSSAL NERVE STIMULATOR;  Surgeon: Christia Reading, MD;  Location: [facility] OR;  Service: ENT;  Laterality: Right;   LEFT HEART CATH AND CORONARY ANGIOGRAPHY N/A 07/05/2022   Procedure: LEFT HEART CATH AND CORONARY ANGIOGRAPHY;  Surgeon: Swazil[place], Peter M, MD;  Location: [facility] INVASIVE CV LAB;  Service: Cardiovascular;  Laterality: N/A;   LUMBAR DISC SURGERY  2003   Dr. Wynetta Emery   MULTIPLE TOOTH EXTRACTIONS     ORIF WRIST FRACTURE Right 08/29/2018   REDUCTION MAMMAPLASTY Bilateral 2013   SPLIT NIGHT STUDY  11/11/2015   UVULOPALATOPHARYNGOPLASTY     VAGINAL HYSTERECTOMY  05/1995   secondary to fibroids.     Current Meds  Medication Sig   acyclovir (ZOVIRAX) 400 MG tablet TAKE 1 TABLET BY MOUTH 4 TIMES A DAY AS NEEDED (Patient taking differently: Take 800 mg by mouth 2 (two) times daily as needed (for sores in nose).)   albuterol (PROVENTIL) (2.5 MG/3ML) 0.083% nebulizer solution Take 3 mLs (2.5 mg total) by nebulization every 6 (six) hours as needed for wheezing or shortness of breath.    alprazolam (XANAX) 2 MG tablet TAKE 1 TABLET BY MOUTH 3 TIMES DAILY AS NEEDED FOR ANXIETY.   aspirin EC 81 MG tablet Take 81 mg by mouth daily. Swallow whole.   B Complex-C-Folic Acid (B-COMPLEX/VITAMIN C PO) Take 1 tablet by mouth daily.   Cholecalciferol (VITAMIN D3) 125 MCG (5000 UT) CAPS Take 5,000 Units by mouth once a week.   FLUoxetine (PROZAC) 40 MG capsule TAKE 1 CAPSULE (40 MG TOTAL) BY MOUTH DAILY.   Fluticasone-Umeclidin-Vilant (TRELEGY ELLIPTA) 100-62.5-25 MCG/ACT AEPB Inhale 1 puff into the lungs daily.   Magnesium 500 MG CAPS Take 500 mg by mouth daily.   omeprazole (PRILOSEC) 20 MG capsule TAKE 1 CAPSULE BY MOUTH 2 TIMES DAILY BEFORE A MEAL. (Patient taking differently: Take 20 mg by mouth at bedtime.)   polyethylene glycol (MIRALAX / GLYCOLAX) 17 g packet Use one packet daily (Patient taking differently: Take 17 g by mouth 2 (two) times daily as needed for moderate constipation.)   potassium chloride (KLOR-CON) 10 MEQ tablet Take 1 tablet (10 mEq total) by mouth daily.   rosuvastatin (CRESTOR) 40 MG tablet TAKE 1 TABLET (40 MG TOTAL) BY MOUTH DAILY.   triamterene-hydrochlorothiazide (DYAZIDE) 37.5-25 MG capsule Take 1 capsule by mouth every morning.     Allergies:   Sulfonamide derivatives, Flexeril [cyclobenzaprine], Hydromorphone, Morphine and related, and Sulfamethoxazole   Social History   Tobacco Use   Smoking status: Former    Years: 30.00    Types: Cigarettes    Quit date: 12/2020    Years since quitting: 1.5   Smokeless tobacco: Never  Vaping Use   Vaping Use: Never used  Substance Use Topics   Alcohol use: Not Currently   Drug use: No     Family Hx: The patient's family history includes Alcohol abuse in an other family member; Arthritis in an other family member; Asthma in her father; Coronary artery disease in her brother, sister, and another family member; Dementia in her mother;  Depression in an other family member; Diabetes in an other family member;  Hyperlipidemia in her brother, mother, sister, sister, and another family member; Hypertension in her brother, sister, sister, and another family member; Sudden death in an other family member; Thyroid disease in her sister.  ROS:   Please see the history of present illness.     All other systems reviewed and are negative.   Prior Sleep studies:   The following studies were reviewed today:  none  Labs/Other Tests and Data Reviewed:     Recent Labs: 12/20/2021: TSH 3.67 07/04/2022: ALT 22; B Natriuretic Peptide 5.4; BUN 24; Creatinine, Ser 0.75; Potassium 3.6; Sodium 138 07/05/2022: Hemoglobin 10.8; Platelets 145   Wt Readings from Last 3 Encounters:  07/19/22 178 lb (80.7 kg)  07/04/22 182 lb (82.6 kg)  07/04/22 187 lb 12.8 oz (85.2 kg)     Risk Assessment/Calculations:      Objective:    Vital Signs:  Ht 5\' 2"  (1.575 m)   Wt 178 lb (80.7 kg)   BMI 32.56 kg/m    VITAL SIGNS:  reviewed GEN:  no acute distress EYES:  sclerae anicteric, EOMI - Extraocular Movements Intact RESPIRATORY:  normal respiratory effort, symmetric expansion CARDIOVASCULAR:  no peripheral edema SKIN:  no rash, lesions or ulcers. MUSCULOSKELETAL:  no obvious deformities. NEURO:  alert and oriented x 3, no obvious focal deficit PSYCH:  normal affect  ASSESSMENT & PLAN:     OSA -intolerant to CPAP therapy -s/p hypoglossal nerve stimulator -As stated above her stimulation range was increased to 2.3-3.3V at office visit 09/2019 with repeat sleep study in November 2021 showing no residual sleep apnea -Itamar sleep study was done 08/08/2021 for follow-up which showed moderate obstructive sleep apnea with an AHI of 17.9/h -At last OV she was having a lot of difficulty with sore throat in the morning as well as feeling like her tongue is out of her mouth the hallway all night and has not been sleeping well at all -Her thresholds had decreased to 1 V and it was felt that she had had a decrease in  stimulation threshold over time due to healing and did not require a high pacing threshold any longef resulting in overstimulation of the hypoglossal nerve which is changing her anatomy and was resulting in more apneas. -Her parameters were changed with the stimulation range of 1.4 V to 1.8 V with a  goal  to get to level 2 or 3 which would be 1.5 to 1.6 V. -She is doing great on the current settings and is on level 5 with complete resolution of her mouth dryness and sore throat and has no daytime sleepiness -A Itamar home sleep study in June 2023 showed residual mild obstructive sleep apnea on level 5 at 1.8V with an AHI of 7.3/h but down from 17/hour at her baseline study -OV  05/01/2022 she had increased snoring on her side and had gained 15lbs since May and suspect that she is having more apneas>>increased voltage was tried in office but had significant protrusion with angulation to the left >> kept at  level 5 at 1.8V  -Repeat HST 05/13/2022 with AHI 9/hr -Office visit 06/07/2022 her device was interrogated.  She was at level 5 which was 1.8 V and testing at this voltage showed prominent left-sided tongue deviation.  Suspected that this may be distorting anatomy and causing her  to actually have more apneas and resulting in more snoring.  Her device was reprogrammed after testing  her tongue motion at multiple levels.  1.4 V showed good tongue motion.  Her device was reprogrammed with a range of 1.2 to 1.6 V.  Her optimal level will be level 3 which is 1.4 V. -OV today 07/19/21 she is doing well and has lost 9lbs.  She feels great and is sleeping through the night with no snoring and no daytime sleepiness.  She is also getting into REM sleep and dreaming.  Will keep at current setting of level 3 which is 1.4V.   2.  Hypertension -BP is controlled at home -She is not on any antihypertensive therapy  Total time of encounter: 15 minutes total time of encounter, including 10 minutes spent in face-to-face  patient care on the date of this encounter. This time includes coordination of care and counseling regarding above mentioned problem list. Remainder of non-face-to-face time involved reviewing chart documents/testing relevant to the patient encounter and documentation in the medical record. I have independently reviewed documentation from referring provider.    Medication Adjustments/Labs and Tests Ordered: Current medicines are reviewed at length with the patient today.  Concerns regarding medicines are outlined above.   Tests Ordered: No orders of the defined types were placed in this encounter.   Medication Changes: No orders of the defined types were placed in this encounter.   Follow Up:  In Person in 4 week(s) with Inspire Rep  Signed, Fransico Him, MD  07/19/2022 9:52 AM    [place]

## 2022-07-19 ENCOUNTER — Ambulatory Visit: Payer: PPO | Attending: Cardiology | Admitting: Cardiology

## 2022-07-19 ENCOUNTER — Encounter: Payer: Self-pay | Admitting: Cardiology

## 2022-07-19 VITALS — Ht 62.0 in | Wt 178.0 lb

## 2022-07-19 DIAGNOSIS — G4733 Obstructive sleep apnea (adult) (pediatric): Secondary | ICD-10-CM | POA: Diagnosis not present

## 2022-07-19 DIAGNOSIS — I10 Essential (primary) hypertension: Secondary | ICD-10-CM

## 2022-07-30 ENCOUNTER — Ambulatory Visit (INDEPENDENT_AMBULATORY_CARE_PROVIDER_SITE_OTHER): Payer: PPO

## 2022-07-30 VITALS — Ht 62.0 in | Wt 178.5 lb

## 2022-07-30 DIAGNOSIS — Z0000 Encounter for general adult medical examination without abnormal findings: Secondary | ICD-10-CM

## 2022-07-30 NOTE — Patient Instructions (Addendum)
[name] [name] , Thank you for taking time to come for your Medicare Wellness Visit. I appreciate your ongoing commitment to your health goals. Please review the following plan we discussed and let me know if I can assist you in the future.   These are the goals we discussed:  Goals       Exercise 150 min/wk Moderate Activity (pt-stated)      Increase physical activity.       Management of MH Conditions      Care Coordination Interventions: Solution-Focused Strategies employed:  Mindfulness or Relaxation training provided Active listening / Reflection utilized  Emotional Support Provided Provided psychoeducation for mental health needs  Caregiver stress acknowledged  Verbalization of feelings encouraged  LCSW spoke with patient and spouse, [name] [name] Family provided LCSW with an update on management of chronic health conditions. Upcoming appts with specialists discussed  Patient has implemented breathing tx and has a nebulizer, to assist. She has discontinued tramadol and gabapentin "weeks ago" [name] [name] has made adjustments and pt's sleep has improved Healthy coping skills discussed            Patient Stated      Keep active and walk more.      Quit Smoking      Find effective way to deal with negative energy such as exercise and staying busy with a project        This is a list of the screening recommended for you and due dates:  Health Maintenance  Topic Date Due   COVID-19 Vaccine (1) 10/07/2022*   Zoster (Shingles) Vaccine (2 of 2) 01/16/2023*   Mammogram  07/31/2023*   DEXA scan (bone density measurement)  07/31/2023*   Flu Shot  01/17/2023   Pneumonia Vaccine (3 of 3 - PPSV23 or PCV20) 03/08/2023   DTaP/Tdap/Td vaccine (2 - Td or Tdap) 04/21/2023   Medicare Annual Wellness Visit  07/31/2023   Colon Cancer Screening  05/20/2031   Hepatitis C Screening: USPSTF Recommendation to screen - Ages 18-79 yo.  Completed   HPV Vaccine  Aged Out  *Topic was postponed. The date  shown is not the original due date.    Advanced directives: Please bring a copy of your health care power of attorney and living will to the office to be added to your chart at your convenience.   Conditions/risks identified: None  Next appointment: Follow up in one year for your annual wellness visit     Preventive Care 65 Years and Older, Female Preventive care refers to lifestyle choices and visits with your health care provider that can promote health and wellness. What does preventive care include? A yearly physical exam. This is also called an annual well check. Dental exams once or twice a year. Routine eye exams. Ask your health care provider how often you should have your eyes checked. Personal lifestyle choices, including: Daily care of your teeth and gums. Regular physical activity. Eating a healthy diet. Avoiding tobacco and drug use. Limiting alcohol use. Practicing safe sex. Taking low-dose aspirin every day. Taking vitamin and mineral supplements as recommended by your health care provider. What happens during an annual well check? The services and screenings done by your health care provider during your annual well check will depend on your age, overall health, lifestyle risk factors, and family history of disease. Counseling  Your health care provider may ask you questions about your: Alcohol use. Tobacco use. Drug use. Emotional well-being. Home and relationship well-being. Sexual activity. Eating habits. History  of falls. Memory and ability to understand (cognition). Work and work Statistician. Reproductive health. Screening  You may have the following tests or measurements: Height, weight, and BMI. Blood pressure. Lipid and cholesterol levels. These may be checked every 5 years, or more frequently if you are over 30 years old. Skin check. Lung cancer screening. You may have this screening every year starting at age 64 if you have a 30-pack-year  history of smoking and currently smoke or have quit within the past 15 years. Fecal occult blood test (FOBT) of the stool. You may have this test every year starting at age 70. Flexible sigmoidoscopy or colonoscopy. You may have a sigmoidoscopy every 5 years or a colonoscopy every 10 years starting at age 53. Hepatitis C blood test. Hepatitis B blood test. Sexually transmitted disease (STD) testing. Diabetes screening. This is done by checking your blood sugar (glucose) after you have not eaten for a while (fasting). You may have this done every 1-3 years. Bone density scan. This is done to screen for osteoporosis. You may have this done starting at age 51. Mammogram. This may be done every 1-2 years. Talk to your health care provider about how often you should have regular mammograms. Talk with your health care provider about your test results, treatment options, and if necessary, the need for more tests. Vaccines  Your health care provider may recommend certain vaccines, such as: Influenza vaccine. This is recommended every year. Tetanus, diphtheria, and acellular pertussis (Tdap, Td) vaccine. You may need a Td booster every 10 years. Zoster vaccine. You may need this after age 36. Pneumococcal 13-valent conjugate (PCV13) vaccine. One dose is recommended after age 41. Pneumococcal polysaccharide (PPSV23) vaccine. One dose is recommended after age 37. Talk to your health care provider about which screenings and vaccines you need and how often you need them. This information is not intended to replace advice given to you by your health care provider. Make sure you discuss any questions you have with your health care provider. Document Released: 07/01/2015 Document Revised: 02/22/2016 Document Reviewed: 04/05/2015 Elsevier Interactive Patient Education  2017 [place] Prevention in the Home Falls can cause injuries. They can happen to people of all ages. There are many things you can  do to make your home safe and to help prevent falls. What can I do on the outside of my home? Regularly fix the edges of walkways and driveways and fix any cracks. Remove anything that might make you trip as you walk through a door, such as a raised step or threshold. Trim any bushes or trees on the path to your home. Use bright outdoor lighting. Clear any walking paths of anything that might make someone trip, such as rocks or tools. Regularly check to see if handrails are loose or broken. Make sure that both sides of any steps have handrails. Any raised decks and porches should have guardrails on the edges. Have any leaves, snow, or ice cleared regularly. Use sand or salt on walking paths during winter. Clean up any spills in your garage right away. This includes oil or grease spills. What can I do in the bathroom? Use night lights. Install grab bars by the toilet and in the tub and shower. Do not use towel bars as grab bars. Use non-skid mats or decals in the tub or shower. If you need to sit down in the shower, use a plastic, non-slip stool. Keep the floor dry. Clean up any water that spills on  the floor as soon as it happens. Remove soap buildup in the tub or shower regularly. Attach bath mats securely with double-sided non-slip rug tape. Do not have throw rugs and other things on the floor that can make you trip. What can I do in the bedroom? Use night lights. Make sure that you have a light by your bed that is easy to reach. Do not use any sheets or blankets that are too big for your bed. They should not hang down onto the floor. Have a firm chair that has side arms. You can use this for support while you get dressed. Do not have throw rugs and other things on the floor that can make you trip. What can I do in the kitchen? Clean up any spills right away. Avoid walking on wet floors. Keep items that you use a lot in easy-to-reach places. If you need to reach something above you,  use a strong step stool that has a grab bar. Keep electrical cords out of the way. Do not use floor polish or wax that makes floors slippery. If you must use wax, use non-skid floor wax. Do not have throw rugs and other things on the floor that can make you trip. What can I do with my stairs? Do not leave any items on the stairs. Make sure that there are handrails on both sides of the stairs and use them. Fix handrails that are broken or loose. Make sure that handrails are as long as the stairways. Check any carpeting to make sure that it is firmly attached to the stairs. Fix any carpet that is loose or worn. Avoid having throw rugs at the top or bottom of the stairs. If you do have throw rugs, attach them to the floor with carpet tape. Make sure that you have a light switch at the top of the stairs and the bottom of the stairs. If you do not have them, ask someone to add them for you. What else can I do to help prevent falls? Wear shoes that: Do not have high heels. Have rubber bottoms. Are comfortable and fit you well. Are closed at the toe. Do not wear sandals. If you use a stepladder: Make sure that it is fully opened. Do not climb a closed stepladder. Make sure that both sides of the stepladder are locked into place. Ask someone to hold it for you, if possible. Clearly mark and make sure that you can see: Any grab bars or handrails. First and last steps. Where the edge of each step is. Use tools that help you move around (mobility aids) if they are needed. These include: Canes. Walkers. Scooters. Crutches. Turn on the lights when you go into a dark area. Replace any light bulbs as soon as they burn out. Set up your furniture so you have a clear path. Avoid moving your furniture around. If any of your floors are uneven, fix them. If there are any pets around you, be aware of where they are. Review your medicines with your doctor. Some medicines can make you feel dizzy. This can  increase your chance of falling. Ask your doctor what other things that you can do to help prevent falls. This information is not intended to replace advice given to you by your health care provider. Make sure you discuss any questions you have with your health care provider. Document Released: 03/31/2009 Document Revised: 11/10/2015 Document Reviewed: 07/09/2014 Elsevier Interactive Patient Education  2017 [employer].

## 2022-07-30 NOTE — Telephone Encounter (Signed)
Prior Authorization for [facility] sent to [facility] via Phone. Reference # .  NO PA REQ

## 2022-07-30 NOTE — Progress Notes (Addendum)
Subjective:   [name] [name] is a 67 y.o. female who presents for Medicare Annual (Subsequent) preventive examination.  Review of Systems    Virtual Visit via Telephone Note  I connected with  [name] [name] on 2/12/24at  3:45 PM EST by telephone and verified that I am speaking with the correct person using two identifiers.  Location: Patient: Home Provider: Office Persons participating in the virtual visit: patient/Nurse Health Advisor   I discussed the limitations, risks, security and privacy concerns of performing an evaluation and management service by telephone and the availability of in person appointments. The patient expressed understanding and agreed to proceed.  Interactive audio and video telecommunications were attempted between this nurse and patient, however failed, due to patient having technical difficulties OR patient did not have access to video capability.  We continued and completed visit with audio only.  Some vital signs may be absent or patient reported.   [name] Rung, LPN  Cardiac Risk Factors include: advanced age (>2men, >52 women);hypertension     Objective:    Today's Vitals   07/30/22 1526  Weight: 178 lb 8 oz (81 kg)  Height: 5\' 2"  (1.575 m)   Body mass index is 32.65 kg/m.     07/30/2022    3:39 PM 12/05/2021    4:53 PM 11/30/2021    9:41 AM 08/16/2021    8:55 AM 07/26/2021    4:02 PM 07/28/2020    7:45 PM 07/20/2020    3:27 PM  Advanced Directives  Does Patient Have a Medical Advance Directive? Yes No No Yes Yes Yes Yes  Type of Estate agent of [place];Living will   Healthcare Power of [place];Living will Healthcare Power of [place];Living will Healthcare Power of [employer] of [place];Living will  Does patient want to make changes to medical advance directive?     No - Patient declined No - Patient declined   Copy of Healthcare Power of Attorney in Chart? No - copy requested    No - copy  requested No - copy requested No - copy requested  Would patient like information on creating a medical advance directive?  No - Patient declined         Current Medications (verified) Outpatient Encounter Medications as of 07/30/2022  Medication Sig   acyclovir (ZOVIRAX) 400 MG tablet TAKE 1 TABLET BY MOUTH 4 TIMES A DAY AS NEEDED (Patient taking differently: Take 800 mg by mouth 2 (two) times daily as needed (for sores in nose).)   albuterol (PROVENTIL) (2.5 MG/3ML) 0.083% nebulizer solution Take 3 mLs (2.5 mg total) by nebulization every 6 (six) hours as needed for wheezing or shortness of breath.   alprazolam (XANAX) 2 MG tablet TAKE 1 TABLET BY MOUTH 3 TIMES DAILY AS NEEDED FOR ANXIETY.   aspirin EC 81 MG tablet Take 81 mg by mouth daily. Swallow whole.   B Complex-C-Folic Acid (B-COMPLEX/VITAMIN C PO) Take 1 tablet by mouth daily.   Cholecalciferol (VITAMIN D3) 125 MCG (5000 UT) CAPS Take 5,000 Units by mouth once a week.   ezetimibe (ZETIA) 10 MG tablet Take 10 mg by mouth daily. (Patient not taking: Reported on 07/19/2022)   FLUoxetine (PROZAC) 40 MG capsule TAKE 1 CAPSULE (40 MG TOTAL) BY MOUTH DAILY.   Fluticasone-Umeclidin-Vilant (TRELEGY ELLIPTA) 100-62.5-25 MCG/ACT AEPB Inhale 1 puff into the lungs daily.   Magnesium 500 MG CAPS Take 500 mg by mouth daily.   omeprazole (PRILOSEC) 20 MG capsule TAKE 1 CAPSULE BY  MOUTH 2 TIMES DAILY BEFORE A MEAL. (Patient taking differently: Take 20 mg by mouth at bedtime.)   polyethylene glycol (MIRALAX / GLYCOLAX) 17 g packet Use one packet daily (Patient taking differently: Take 17 g by mouth 2 (two) times daily as needed for moderate constipation.)   potassium chloride (KLOR-CON) 10 MEQ tablet Take 1 tablet (10 mEq total) by mouth daily.   rosuvastatin (CRESTOR) 40 MG tablet TAKE 1 TABLET (40 MG TOTAL) BY MOUTH DAILY.   triamterene-hydrochlorothiazide (DYAZIDE) 37.5-25 MG capsule Take 1 capsule by mouth every morning.   No facility-administered  encounter medications on file as of 07/30/2022.    Allergies (verified) Sulfonamide derivatives, Flexeril [cyclobenzaprine], Hydromorphone, Morphine and related, and Sulfamethoxazole   History: Past Medical History:  Diagnosis Date   Allergic rhinitis 01/23/2008   Angina pectoris 06/06/2016   Arthritis    Attention deficit hyperactivity disorder (ADHD) 02/07/2016   Back pain, lumbar 01/21/2009   Bell's palsy 02/06/2008   Bipolar disorder 12/13/2009   Chronic idiopathic constipation    Dizziness 04/16/2008   Edema, leg 01/21/2009   Epigastric pain    Essential hypertension 01/23/2008   Gastro-esophageal reflux disease without esophagitis 01/23/2008   Generalized anxiety disorder 01/23/2008   Headache 04/15/2007   Hidradenitis 10/04/2008   History of colonic polyps    Hyperlipidemia    Hypokalemia 07/23/2018   Insomnia 01/23/2008   Major depressive disorder 01/23/2008   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   Neck pain 05/06/2017   Obesity (BMI 30-39.9) 11/10/2015   OSA (obstructive sleep apnea) 04/15/2007   inspire implant; does not always remember to turn device on   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/2008   Qualifier: Diagnosis of  By: Clent Ridges MD, Jeannett Senior A    PUD (peptic ulcer disease) 06/06/2016   Unspecified urinary incontinence 01/23/2008   Wears dentures    Wears glasses    Past Surgical History:  Procedure Laterality Date   ABDOMINAL HYSTERECTOMY     ANTERIOR FUSION CERVICAL SPINE  2006   Dr. Wynetta Emery   BREAST BIOPSY Right 1981   benign   BREAST EXCISIONAL BIOPSY Right 1981   benign   scar does not show   BREAST SURGERY Bilateral 12/11/2012   reductions [facility] Dr. Ivin Booty in [facility]    CATARACT EXTRACTION, BILATERAL Bilateral last summer of 2018   at [facility]   COLONOSCOPY  03/07/2021   per Dr. Loreta Ave, adenomatous polyps, repeat in 7 yrs   DRUG INDUCED ENDOSCOPY N/A 01/30/2019   Procedure: DRUG INDUCED ENDOSCOPY;  Surgeon: Christia Reading, MD;  Location: [facility];  Service: ENT;  Laterality: N/A;   ESOPHAGOGASTRODUODENOSCOPY  04/23/2011   per Dr. Loreta Ave, normal    EYE SURGERY  06/18/2016   L eyelid    IMPLANTATION OF HYPOGLOSSAL NERVE STIMULATOR Right 03/25/2019   Procedure: IMPLANTATION OF HYPOGLOSSAL NERVE STIMULATOR;  Surgeon: Christia Reading, MD;  Location: [facility] OR;  Service: ENT;  Laterality: Right;   LEFT HEART CATH AND CORONARY ANGIOGRAPHY N/A 07/05/2022   Procedure: LEFT HEART CATH AND CORONARY ANGIOGRAPHY;  Surgeon: [place]az[place], Peter M, MD;  Location: [facility] INVASIVE CV LAB;  Service: Cardiovascular;  Laterality: N/A;   LUMBAR DISC SURGERY  2003   Dr. Wynetta Emery   MULTIPLE TOOTH EXTRACTIONS     ORIF WRIST FRACTURE Right 08/29/2018   REDUCTION MAMMAPLASTY Bilateral 2013   SPLIT NIGHT STUDY  11/11/2015   UVULOPALATOPHARYNGOPLASTY     VAGINAL HYSTERECTOMY  05/1995   secondary to fibroids.   Family History  Problem Relation Age  of Onset   Dementia Mother    Hyperlipidemia Mother    Asthma Father    Hypertension Sister    Hyperlipidemia Sister    Coronary artery disease Sister    Thyroid disease Sister    Hypertension Brother    Hyperlipidemia Brother    Coronary artery disease Brother    Hypertension Sister    Hyperlipidemia Sister    Alcohol abuse Other        fhx   Arthritis Other        fhx   Coronary artery disease Other        fhx   Depression Other        fhx   Diabetes Other        fhx   Hyperlipidemia Other        fhx   Hypertension Other        fhx   Sudden death Other        fhx   Social History   Socioeconomic History   Marital status: Married    Spouse name: Not on file   Number of children: 2   Years of education: 14   Highest education level: Associate degree: academic program  Occupational History   Occupation: Disabled  Tobacco Use   Smoking status: Former    Years: 30    Types: Cigarettes    Quit date: 12/2020    Years since quitting: 1.8   Smokeless tobacco: Never  Vaping Use   Vaping Use: Never  used  Substance and Sexual Activity   Alcohol use: Not Currently   Drug use: No   Sexual activity: Not Currently    Partners: Male    Birth control/protection: Surgical  Other Topics Concern   Not on file  Social History Narrative   Pt lives in [place], [place].   Right handed    Occasional use of caffeine   Lives with ex-husband         07/10/2018: Lives with ex-husband on one-level house. Divorced, according to husband, for financial benefit only    Has two children, one local and one in [place]. Has several grandchildren locally who can help her occasionally if needed.   Social Determinants of Health   Financial Resource Strain: Low Risk  (07/30/2022)   Overall Financial Resource Strain (CARDIA)    Difficulty of Paying Living Expenses: Not hard at all  Food Insecurity: No Food Insecurity (07/30/2022)   Hunger Vital Sign    Worried About Running Out of Food in the Last Year: Never true    Ran Out of Food in the Last Year: Never true  Transportation Needs: No Transportation Needs (07/30/2022)   PRAPARE - Administrator, Civil Service (Medical): No    Lack of Transportation (Non-Medical): No  Physical Activity: Insufficiently Active (07/30/2022)   Exercise Vital Sign    Days of Exercise per Week: 3 days    Minutes of Exercise per Session: 30 min  Stress: No Stress Concern Present (07/30/2022)   [employer] of Occupational Health - Occupational Stress Questionnaire    Feeling of Stress : Not at all  Social Connections: Socially Integrated (07/30/2022)   Social Connection and Isolation Panel [NHANES]    Frequency of Communication with Friends and Family: More than three times a week    Frequency of Social Gatherings with Friends and Family: More than three times a week    Attends Religious Services: More than 4 times per year  Active Member of Clubs or Organizations: Yes    Attends Music therapist: More than 4 times per year    Marital Status: Married     Tobacco Counseling Counseling given: Not Answered   Clinical Intake:  Pre-visit preparation completed: No  Pain : No/denies pain     BMI - recorded: 32.65 Nutritional Status: BMI > 30  Obese Nutritional Risks: None Diabetes: No  How often do you need to have someone help you when you read instructions, pamphlets, or other written materials from your doctor or pharmacy?: 1 - Never  Diabetic?  No  Interpreter Needed?: No  Information entered by :: Rolene Arbour LPN   Activities of Daily Living    07/30/2022    3:35 PM  In your present state of health, do you have any difficulty performing the following activities:  Hearing? 0  Vision? 0  Difficulty concentrating or making decisions? 0  Walking or climbing stairs? 0  Dressing or bathing? 0  Doing errands, shopping? 0  Preparing Food and eating ? N  Using the Toilet? N  In the past six months, have you accidently leaked urine? Y  Comment Wears Pads and breifs, Followed by Urologist  Do you have problems with loss of bowel control? N  Managing your Medications? N  Managing your Finances? N  Housekeeping or managing your Housekeeping? N    Patient Care Team: Laurey Morale, MD as PCP - General Sueanne Margarita, MD as Consulting Physician (Cardiology) Viona Gilmore, [facility] (Inactive) as Pharmacist (Pharmacist) Lbcardiology, Rounding, MD as Rounding Team (Cardiology)  Indicate any recent Medical Services you may have received from other than [facility] providers in the past year (date may be approximate).     Assessment:   This is a routine wellness examination for [name] [name].  Hearing/Vision screen Hearing Screening - Comments:: Denies hearing difficulties   Vision Screening - Comments:: Wears rx glasses - up to date with routine eye exams with  [place] issues and exercise activities discussed: Exercise limited by: None identified   Goals Addressed               This Visit's Progress      Exercise 150 min/wk Moderate Activity (pt-stated)        Increase physical activity.        Depression Screen    07/30/2022    3:31 PM 04/03/2022    1:48 PM 12/20/2021    9:43 AM 12/07/2021   12:05 PM 07/26/2021    3:36 PM 07/20/2020    3:24 PM 06/08/2020    1:34 PM  PHQ 2/9 Scores  PHQ - 2 Score 0 4 0 2 6 0 6  PHQ- 9 Score  22 1 11 18  20    $ Fall Risk    07/30/2022    3:37 PM 04/03/2022    1:48 PM 12/20/2021    9:44 AM 12/07/2021   12:01 PM 08/16/2021    8:54 AM  Fall Risk   Falls in the past year? 0 1 1 0 1  Number falls in past yr: 0 1 1 1 $ 0  Injury with Fall? 0 1 1 0 0  Comment  Concussion/ minor bruising Bruises    Risk for fall due to : No Fall Risks History of fall(s) No Fall Risks    Follow up Falls prevention discussed Falls evaluation completed Falls evaluation completed      [place]:  Any stairs in or around the home? Yes  If so, are there any without handrails? No  Home free of loose throw rugs in walkways, pet beds, electrical cords, etc? Yes  Adequate lighting in your home to reduce risk of falls? Yes   ASSISTIVE DEVICES UTILIZED TO PREVENT FALLS:  Life alert? No  Use of a cane, walker or w/c? Yes Grab bars in the bathroom? Yes  Shower chair or bench in shower? Yes  Elevated toilet seat or a handicapped toilet? No   TIMED UP AND GO:  Was the test performed? No .    Cognitive Function:    03/30/2016    3:10 PM  MMSE - Mini Mental State Exam  Orientation to time 5  Orientation to Place 4  Registration 3  Attention/ Calculation 4  Recall 2  Language- name 2 objects 2  Language- repeat 1  Language- follow 3 step command 3  Language- read & follow direction 1  Write a sentence 1  Copy design 1  Total score 27        07/30/2022    3:39 PM 07/26/2021    4:03 PM 07/20/2020    3:33 PM 07/14/2019   11:23 AM  6CIT Screen  What Year? 0 points 0 points 0 points 0 points  What month? 0 points 0 points 0 points 0  points  What time? 0 points 0 points  0 points  Count back from 20 0 points 0 points 0 points 0 points  Months in reverse 0 points 0 points 2 points 0 points  Repeat phrase 0 points 0 points 4 points 0 points  Total Score 0 points 0 points  0 points    Immunizations Immunization History  Administered Date(s) Administered   Influenza Split 04/26/2011   Influenza Whole 03/23/2010   Influenza,inj,Quad PF,6+ Mos 03/20/2013, 03/02/2015, 03/26/2016, 03/25/2017, 05/08/2019, 03/17/2020   Influenza-Unspecified 05/26/2014, 03/03/2018   Pneumococcal Conjugate-13 04/19/2016   Pneumococcal Polysaccharide-23 03/07/2018   Tdap 04/20/2013   Zoster Recombinat (Shingrix) 02/18/2019   Zoster, Live 03/26/2016    TDAP status: Up to date   Pneumonia Vaccine: Up to date  Covid-19 vaccine status: Declined, Education has been provided regarding the importance of this vaccine but patient still declined. Advised may receive this vaccine at local pharmacy or Health Dept.or vaccine clinic. Aware to provide a copy of the vaccination record if obtained from local pharmacy or Health Dept. Verbalized acceptance and understanding.  Qualifies for Shingles Vaccine? Yes   Zostavax completed Yes   Shingrix Completed?: Yes  Screening Tests Health Maintenance  Topic Date Due   COVID-19 Vaccine (1) Never done   Zoster Vaccines- Shingrix (2 of 2) 01/16/2023 (Originally 04/15/2019)   MAMMOGRAM  07/31/2023 (Originally 03/26/2020)   DEXA SCAN  07/31/2023 (Originally 06/14/2020)   INFLUENZA VACCINE  01/17/2023   Pneumonia Vaccine 1965+ Years old (3 of 3 - PPSV23 or PCV20) 03/08/2023   DTaP/Tdap/Td (2 - Td or Tdap) 04/21/2023   Medicare Annual Wellness (AWV)  07/31/2023   COLONOSCOPY (Pts 45-6311yrs Insurance coverage will need to be confirmed)  05/20/2031   Hepatitis C Screening  Completed   HPV VACCINES  Aged Out    Health Maintenance  Health Maintenance Due  Topic Date Due   COVID-19 Vaccine (1) Never done      Colorectal cancer screening: Type of screening: Colonoscopy. Completed 05/19/21. Repeat every 10 years  Mammogram status: Ordered Patient deferred. Pt provided with contact info and advised to call to schedule appt.  Bone Density status: Ordered Patient deferred. Pt provided with contact info and advised to call to schedule appt.  Lung Cancer Screening: (Low Dose CT Chest recommended if Age 24-80 years, 30 pack-year currently smoking OR have quit w/in 15years.) does not qualify.     Additional Screening:  Hepatitis C Screening: does qualify; Completed 08/08/15  Vision Screening: Recommended annual ophthalmology exams for early detection of glaucoma and other disorders of the eye. Is the patient up to date with their annual eye exam?  Yes  Who is the provider or what is the name of the office in which the patient attends annual eye exams? [facility] If pt is not established with a provider, would they like to be referred to a provider to establish care? No .   Dental Screening: Recommended annual dental exams for proper oral hygiene  Community Resource Referral / Chronic Care Management:  CRR required this visit?  No   CCM required this visit?  No      Plan:     I have personally reviewed and noted the following in the patient's chart:   Medical and social history Use of alcohol, tobacco or illicit drugs  Current medications and supplements including opioid prescriptions. Patient is not currently taking opioid prescriptions. Functional ability and status Nutritional status Physical activity Advanced directives List of other physicians Hospitalizations, surgeries, and ER visits in previous 12 months Vitals Screenings to include cognitive, depression, and falls Referrals and appointments  In addition, I have reviewed and discussed with patient certain preventive protocols, quality metrics, and best practice recommendations. A written personalized care plan for  preventive services as well as general preventive health recommendations were provided to patient.     [name] Rung, LPN   1/61/09  Nurse Notes: None

## 2022-08-16 ENCOUNTER — Ambulatory Visit: Payer: Self-pay | Admitting: Licensed Clinical Social Worker

## 2022-08-16 NOTE — Patient Outreach (Signed)
  Care Coordination   Follow Up Visit Note   08/16/2022 Name: [name] [name] MRN: [id number] DOB: 1956/03/25  Sherilyn Hauswirth is a 67 y.o. year old female who sees Laurey Morale, MD for primary care. I spoke with  [name] by phone today.  What matters to the patients health and wellness today?  Symptom Management Patient continues to maintain positive progress with goals.    Goals Addressed             This Visit's Progress    Management of MH Conditions   On track    Care Coordination Interventions: Solution-Focused Strategies employed:  Mindfulness or Relaxation training provided Active listening / Reflection utilized  Emotional Support Provided Provided psychoeducation for mental health needs  Caregiver stress acknowledged  Verbalization of feelings encouraged  LCSW spoke with patient and spouse, Jenny Reichmann Family provided LCSW with an update on management of chronic health conditions. Upcoming appts with specialists discussed  Patient has implemented breathing tx and has a nebulizer, to assist. She has discontinued tramadol and gabapentin "weeks ago" Dawna Part has made adjustments and pt's sleep has improved Healthy coping skills discussed              SDOH assessments and interventions completed:  No     Care Coordination Interventions:  Yes, provided  Interventions Today    Flowsheet Row Most Recent Value  Chronic Disease   Chronic disease during today's visit Other  [Bipolar Disorder, MDD, GAD]  General Interventions   General Interventions Discussed/Reviewed General Interventions Reviewed  Mental Health Interventions   Mental Health Discussed/Reviewed Mental Health Reviewed, Coping Strategies, Depression  [LCSW reviewed last two PHQ9 screens and commended pt for management of symptoms. Mindfulness strategies discussed. Assessed forms of support. LCSW continued to encourage pt to implement rest and self-care to promote overall health  management]       Follow up plan: Follow up call scheduled for 6-8 weeks    Encounter Outcome:  Pt. Visit Completed   Christa See, MSW, [email or web address] Phone [phone] 11:43 AM

## 2022-08-16 NOTE — Patient Instructions (Signed)
Visit Information  Thank you for taking time to visit with me today. Please don't hesitate to contact me if I can be of assistance to you.   Following are the goals we discussed today:   Goals Addressed             This Visit's Progress    Management of MH Conditions   On track    Care Coordination Interventions: Solution-Focused Strategies employed:  Mindfulness or Relaxation training provided Active listening / Reflection utilized  Emotional Support Provided Provided psychoeducation for mental health needs  Caregiver stress acknowledged  Verbalization of feelings encouraged  LCSW spoke with patient and spouse, Jenny Reichmann Family provided LCSW with an update on management of chronic health conditions. Upcoming appts with specialists discussed  Patient has implemented breathing tx and has a nebulizer, to assist. She has discontinued tramadol and gabapentin "weeks ago" Dawna Part has made adjustments and pt's sleep has improved Healthy coping skills discussed              Our next appointment is by telephone on 10/11/22 at 11 AM  Please call the care guide team at [phone] if you need to cancel or reschedule your appointment.   If you are experiencing a Mental Health or [place] or need someone to talk to, please call the Suicide and Crisis Lifeline: 988 call 911   Patient verbalizes understanding of instructions and care plan provided today and agrees to view in [place]. Active MyChart status and patient understanding of how to access instructions and care plan via MyChart confirmed with patient.     Christa See, MSW, [email or web address] Phone [phone] 11:44 AM

## 2022-08-17 ENCOUNTER — Ambulatory Visit: Payer: PPO | Admitting: Cardiology

## 2022-09-12 ENCOUNTER — Telehealth: Payer: Self-pay | Admitting: Family Medicine

## 2022-09-12 NOTE — Telephone Encounter (Signed)
Patient says she stopped taking all pain meds she was prescribed, now requesting something for her back and leg pain.  [place], [place] Phone: [phone]  Fax: [phone]

## 2022-09-12 NOTE — Telephone Encounter (Signed)
Last OV-06/08/22

## 2022-09-13 MED ORDER — TRAMADOL HCL 50 MG PO TABS: 100.0000 mg | ORAL_TABLET | Freq: Four times a day (QID) | ORAL | 0 refills | Status: DC | PRN

## 2022-09-13 NOTE — Telephone Encounter (Signed)
Called patient informed prescription sent to [facility] drug

## 2022-09-13 NOTE — Addendum Note (Signed)
Addended by: Alysia Penna A on: 09/13/2022 12:33 PM   Modules accepted: Orders

## 2022-09-13 NOTE — Telephone Encounter (Signed)
I sent in some Tramadol 

## 2022-09-17 ENCOUNTER — Telehealth: Payer: Self-pay | Admitting: Family Medicine

## 2022-09-17 NOTE — Telephone Encounter (Signed)
She was in a pain management program with [place] for years until she decided to stop that medication. If she needs something stronger than Tramadol, she will need to start in a PM program again. Have her make an in person visit sowe can sign a contract, get a UDS, etc

## 2022-09-17 NOTE — Telephone Encounter (Signed)
Patient states the traMADol (ULTRAM) 50 MG tablet is not helping. Requesting a substitution. Says another provider prescribed something when she broke her wrist a few years and she would like to have that again (could not remember the name of the medication)

## 2022-09-18 NOTE — Telephone Encounter (Signed)
Spoke with pt scheduled appointment for PMV/ UDS on 10/19/22 per Dr Sarajane Jews

## 2022-09-19 ENCOUNTER — Ambulatory Visit: Payer: PPO | Admitting: Cardiology

## 2022-09-19 NOTE — Addendum Note (Signed)
Addended by: Adalberto Cole E on: 09/19/2022 11:57 AM   Modules accepted: Level of Service

## 2022-09-25 ENCOUNTER — Ambulatory Visit (INDEPENDENT_AMBULATORY_CARE_PROVIDER_SITE_OTHER): Payer: PPO | Admitting: Pulmonary Disease

## 2022-09-25 ENCOUNTER — Encounter (HOSPITAL_BASED_OUTPATIENT_CLINIC_OR_DEPARTMENT_OTHER): Payer: Self-pay | Admitting: Pulmonary Disease

## 2022-09-25 VITALS — BP 132/76 | HR 66 | Temp 97.9°F | Ht 62.0 in | Wt 192.9 lb

## 2022-09-25 DIAGNOSIS — J849 Interstitial pulmonary disease, unspecified: Secondary | ICD-10-CM | POA: Diagnosis not present

## 2022-09-25 DIAGNOSIS — J9611 Chronic respiratory failure with hypoxia: Secondary | ICD-10-CM | POA: Diagnosis not present

## 2022-09-25 DIAGNOSIS — J432 Centrilobular emphysema: Secondary | ICD-10-CM | POA: Diagnosis not present

## 2022-09-25 NOTE — Progress Notes (Signed)
   Subjective:    Patient ID: [name] [name], female    DOB: [birth year], 67 y.o.   MRN: [id number]  HPI  67 yo smoker for follow-up of emphysema, ILD and pulmonary nodule   Appears to date back to 05/2016 on CT abdomen She smoked for about 15 pack years, a pack would last for 3 days until she quit in July 2022.  Serology in the remote past in 2008 was negative ANA, RA factor, ACE level was 46   PMH - ADHD, bipolar disorder, hypertension -hypoglossal nerve stimulator implant by Dr. Jenne Pane on 03/25/2019.   -Currently set at level 3=1.4 V (Dr Mayford Knife 08/2022)  3-month follow-up visit. On her last office visit 06/2022, has restarted Trelegy and given albuterol nebs to use for rescue.  I get conflicting reports today.  Patient herself states that Trelegy helped and albuterol helps some but husband states that these medications do not help, albuterol makes her jittery and nervous. She continues to have "attacks" where she gets short of breath and this seems to settle down after a few hours.  She reports chest pain in her bra distribution 3 days ago. She had an ED visit for chest pain 07/04/2022, CT angiogram was negative for pulmonary Buddhism.  We reviewed HRCT and PFTs  On ambulation she desaturated from 96 to 83% after 1 lap and was placed on 2 L pulse and was able to maintain oxygen saturation  Significant tests/ events reviewed  Serology 06/2022 ENA RNP + 1.0  LHC 06/2022 LVEDP 25, non obs CAD   PFTs 05/2022 no airway obstruction, ratio 84, FEV1 100%, FVC 91%, TLC 99%, DLCO 13.73/74%  06/2022  HRCT chest>> probable UIP pattern, left upper lobe nodule unchanged  HRCT 01/2022 >> emphysema, probable UIP, dates back to 05/2016, unchanged left upper lobe nodule   CT chest 05/2021 showed emphysema, 13 mm left upper lobe nodule peripheral and bibasilar and peripheral reticular markings, 11 mm right paratracheal lymph node    CT chest with contrast 09/2021 -showed unchanged left upper lobe  nodule and similar changes of ILD.   CT angiogram chest 11/2021 showed right paratracheal lymph node 1.3 cm, unchanged nodule and similar peripheral reticular opacities   04/2020 Inspire titration  07/2021 HST showed AHI of 18/hour on 2.7 V?  Overstimulation HST 11/2021 (on Inspire) mild, AHI 7/h ,low desat 86% on 1.8 V  Review of Systems neg for any significant sore throat, dysphagia, itching, sneezing, nasal congestion or excess/ purulent secretions, fever, chills, sweats, unintended wt loss, pleuritic or exertional cp, hempoptysis, orthopnea pnd or change in chronic leg swelling. Also denies presyncope, palpitations, heartburn, abdominal pain, nausea, vomiting, diarrhea or change in bowel or urinary habits, dysuria,hematuria, rash, arthralgias, visual complaints, headache, numbness weakness or ataxia.     Objective:   Physical Exam  Gen. Pleasant, well-nourished, in no distress ENT - no thrush, no pallor/icterus,no post nasal drip Neck: No JVD, no thyromegaly, no carotid bruits Lungs: no use of accessory muscles, no dullness to percussion, bibasal fine crackles Cardiovascular: Rhythm regular, heart sounds  normal, no murmurs or gallops, no peripheral edema Musculoskeletal: No deformities, no cyanosis or clubbing        Assessment & Plan:

## 2022-09-25 NOTE — Patient Instructions (Signed)
X amb sat  Continue trelegy  Use albuterol only as needed

## 2022-09-25 NOTE — Assessment & Plan Note (Signed)
Some emphysema noted on CT scan however no significant airway obstruction on PFTs.  Not convinced that we need to continue Trelegy inhaler but she reports subjective benefit so we will continue at this point and reassess over the next visit I am concerned that albuterol nebs may be increasing her anxiety and have not really asked her to use it for her "attacks" which I think may be panic attacks Only use albuterol for wheezing and coughing

## 2022-09-25 NOTE — Assessment & Plan Note (Addendum)
She was noted to desaturate on walking today.  Will provide her with oxygen.  Perhaps this will decrease the frequency of her "attacks". Surprisingly her PFTs are now normal.  No evidence of pulmonary hypertension on echo.  We will reassess how she is doing on oxygen in 1 month follow-up visit

## 2022-09-25 NOTE — Assessment & Plan Note (Signed)
I think this may be the main issue.  This has been stable since 2022 but has shown mild progression compared to CT abdomen in 2017.  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  I would favor IPF or CT ILD she does not have any stigmata of collagen vascular disease serology was negative except for borderline RNP but with no joint disease I doubt diagnosis of mixed connective tissue disease.  We may obtain rheumatology opinion to clarify I discussed antifibrotic's with her and side effect profile.  She would like to avoid it at this point unless there is definite indication of disease progression

## 2022-10-03 DIAGNOSIS — J9611 Chronic respiratory failure with hypoxia: Secondary | ICD-10-CM | POA: Diagnosis not present

## 2022-10-11 ENCOUNTER — Ambulatory Visit: Payer: Self-pay | Admitting: Licensed Clinical Social Worker

## 2022-10-11 NOTE — Patient Outreach (Signed)
  Care Coordination   Follow Up Visit Note   10/11/2022 Name: [name] [name] MRN: [id number] DOB: 08-20-1955  [name] [name] is a 67 y.o. year old female who sees Nelwyn Salisbury, MD for primary care. I spoke with  [name] [name] by phone today.  What matters to the patients health and wellness today?  Symptom Management    Goals Addressed             This Visit's Progress    Management of MH Conditions   On track    Activities and task to complete in order to accomplish goals.   Keep all upcoming appointments discussed today Continue with compliance of taking medication prescribed by Doctor and utilizing oxygen machine Implement healthy coping skills discussed to assist with management of symptoms Continue working with [facility] care team to assist with goals identified             SDOH assessments and interventions completed:  No     Care Coordination Interventions:  Yes, provided  Interventions Today    Flowsheet Row Most Recent Value  Chronic Disease   Chronic disease during today's visit Other  [PUD, Bell's Palsy, Meniere's disease, Bipolar disorder, GAD, MDD]  General Interventions   General Interventions Discussed/Reviewed General Interventions Reviewed, Doctor Visits  Doctor Visits Discussed/Reviewed Doctor Visits Reviewed  [Pt has upcoming appt scheduled with OB/GYN and Rhematologist]  Exercise Interventions   Exercise Discussed/Reviewed Exercise Reviewed  [Pt hopes to return to walking after meeting with Rheumatologist]  Mental Health Interventions   Mental Health Discussed/Reviewed Mental Health Reviewed, Coping Strategies, Depression, Anxiety  [Stressors identified. Healthy coping skills discussed to assist with stress management]       Follow up plan: Follow up call scheduled for 6-8 weeks    Encounter Outcome:  Pt. Visit Completed   [name], MSW, LCSW [facility] Care Management [facility] Health  [facility] [email or web address] Phone [phone] 12:03 PM

## 2022-10-11 NOTE — Patient Instructions (Signed)
Visit Information  Thank you for taking time to visit with me today. Please don't hesitate to contact me if I can be of assistance to you.   Following are the goals we discussed today:   Goals Addressed             This Visit's Progress    Management of MH Conditions   On track    Activities and task to complete in order to accomplish goals.   Keep all upcoming appointments discussed today Continue with compliance of taking medication prescribed by Doctor and utilizing oxygen machine Implement healthy coping skills discussed to assist with management of symptoms Continue working with [facility] care team to assist with goals identified             Our next appointment is by telephone on 07/18 at 11 AM  Please call the care guide team at [phone] if you need to cancel or reschedule your appointment.   If you are experiencing a Mental Health or Behavioral Health Crisis or need someone to talk to, please call the Suicide and Crisis Lifeline: 988 call 911   Patient verbalizes understanding of instructions and care plan provided today and agrees to view in MyChart. Active MyChart status and patient understanding of how to access instructions and care plan via MyChart confirmed with patient.     [name], MSW, LCSW [facility] Care Management [place]  [facility] [email or web address] Phone [phone] 12:09 PM

## 2022-10-19 ENCOUNTER — Telehealth: Payer: Self-pay | Admitting: Family Medicine

## 2022-10-19 ENCOUNTER — Encounter: Payer: Self-pay | Admitting: Family Medicine

## 2022-10-19 ENCOUNTER — Ambulatory Visit (INDEPENDENT_AMBULATORY_CARE_PROVIDER_SITE_OTHER): Payer: PPO | Admitting: Family Medicine

## 2022-10-19 VITALS — BP 128/82 | HR 71 | Temp 97.9°F | Wt 187.0 lb

## 2022-10-19 DIAGNOSIS — F1190 Opioid use, unspecified, uncomplicated: Secondary | ICD-10-CM

## 2022-10-19 DIAGNOSIS — G8929 Other chronic pain: Secondary | ICD-10-CM | POA: Diagnosis not present

## 2022-10-19 DIAGNOSIS — M5440 Lumbago with sciatica, unspecified side: Secondary | ICD-10-CM

## 2022-10-19 MED ORDER — OXYCODONE HCL 10 MG PO TABS: 10.0000 mg | ORAL_TABLET | Freq: Four times a day (QID) | ORAL | 0 refills | Status: AC | PRN

## 2022-10-19 MED ORDER — OXYCODONE HCL 10 MG PO TABS: 10.0000 mg | ORAL_TABLET | Freq: Four times a day (QID) | ORAL | 0 refills | Status: DC | PRN

## 2022-10-19 NOTE — Progress Notes (Signed)
   Subjective:    Patient ID: [name] [name], female    DOB: Oct 19, 1955, 67 y.o.   MRN: [id number]  HPI Here to restart a pain management program. Up until 3 years ago we had treated her with Hydrocodone or with Tramadol, but these no longer work well. Her low back pain has gotten worse, and she is now taking a lot of Goody Powders. She is scheduled see Dr. Sheliah Hatch for a Rheumatology consult on 02-03-22.   Review of Systems  Constitutional: Negative.   Musculoskeletal:  Positive for back pain.       Objective:   Physical Exam Constitutional:      Comments: Walks with a cane   Neurological:     Mental Status: She is alert.           Assessment & Plan:  Pain management. Indication for chronic opioid: low back pain Medication and dose: Oxycodone 10 mg # pills per month: 120 Last UDS date: 10-19-22 Opioid Treatment Agreement signed (Y/N): 10-19-22 Opioid Treatment Agreement last reviewed with patient:  10-19-22 [facility] reviewed this encounter (include red flags): Yes We sent in RX for the Oxycodone. She can supplement this with Tylenol as needed.  Gershon Crane, MD

## 2022-10-19 NOTE — Telephone Encounter (Signed)
Pt took Oxycodone HCl 10 MG TABS and tylenol, pain was still the same 45 miniutes later so she took another oxy. Says the meds took almost an hour and a half to give relief. Wanted this noted.

## 2022-10-19 NOTE — Telephone Encounter (Signed)
FYI

## 2022-10-22 ENCOUNTER — Other Ambulatory Visit: Payer: Self-pay | Admitting: Family Medicine

## 2022-10-22 ENCOUNTER — Telehealth: Payer: Self-pay | Admitting: Family Medicine

## 2022-10-22 NOTE — Telephone Encounter (Signed)
[facility] received urine sample without  lab requisition please fax to [phone]

## 2022-10-22 NOTE — Telephone Encounter (Signed)
Done

## 2022-10-23 ENCOUNTER — Other Ambulatory Visit: Payer: Self-pay

## 2022-10-23 NOTE — Telephone Encounter (Signed)
Spoke with our office lab tech stated that she left a message for the lab to cancel the urine since its been 4 days

## 2022-10-24 ENCOUNTER — Other Ambulatory Visit: Payer: Self-pay

## 2022-10-24 DIAGNOSIS — F1190 Opioid use, unspecified, uncomplicated: Secondary | ICD-10-CM

## 2022-10-24 NOTE — Telephone Encounter (Signed)
Noted! Order placed on epic

## 2022-10-24 NOTE — Telephone Encounter (Signed)
[name] [name]...  Just an FYI - Pt will be here on Friday afternoon for labs (urine sample)  Not sure if you needed to put in an order.

## 2022-10-24 NOTE — Telephone Encounter (Signed)
Spoke with pt this morning advised to stop by the office to give urine for a UDS due to the sample going to the wrong lab.

## 2022-10-26 ENCOUNTER — Other Ambulatory Visit: Payer: PPO

## 2022-10-26 DIAGNOSIS — F1190 Opioid use, unspecified, uncomplicated: Secondary | ICD-10-CM | POA: Diagnosis not present

## 2022-10-26 DIAGNOSIS — G8929 Other chronic pain: Secondary | ICD-10-CM | POA: Diagnosis not present

## 2022-10-27 LAB — DRUG MONITOR, PANEL 1, SCREEN, URINE
Amphetamines: NEGATIVE ng/mL (ref <500)
Barbiturates: NEGATIVE ng/mL (ref <300)
Benzodiazepines: POSITIVE ng/mL — AB (ref <100)
Cocaine Metabolite: NEGATIVE ng/mL (ref <150)
Creatinine: 181.8 mg/dL (ref >=20.0)
Marijuana Metabolite: POSITIVE ng/mL — AB (ref <20)
Methadone Metabolite: NEGATIVE ng/mL (ref <100)
Opiates: POSITIVE ng/mL — AB (ref <100)
Oxidant: NEGATIVE ug/mL (ref <200)
Oxycodone: POSITIVE ng/mL — AB (ref <100)
Phencyclidine: NEGATIVE ng/mL (ref <25)
pH: 5.7 (ref 4.5–9.0)

## 2022-10-27 LAB — DM TEMPLATE

## 2022-10-30 ENCOUNTER — Ambulatory Visit: Payer: PPO | Admitting: Cardiology

## 2022-11-02 DIAGNOSIS — J9611 Chronic respiratory failure with hypoxia: Secondary | ICD-10-CM | POA: Diagnosis not present

## 2022-12-03 DIAGNOSIS — J9611 Chronic respiratory failure with hypoxia: Secondary | ICD-10-CM | POA: Diagnosis not present

## 2022-12-04 IMAGING — CT CT ABD-PELV W/ CM
2 of 5 series · 16 of 46 positions shown, 18 images · IV contrast (agent unspecified)
Comparison: CT abdomen pelvis dated 06/26/2021.

CLINICAL DATA: Lower GI bleed.  History of hemorrhoids.

EXAM:
CT ABDOMEN AND PELVIS WITH CONTRAST
TECHNIQUE: Multidetector CT imaging of the abdomen and pelvis was performed
using the standard protocol following bolus administration of
intravenous contrast.

[Series 2: axial st · axial · 0.73mm/px · z∈[-520,-130]mm · 13 of 92 slices shown, 15 images]
[im 7/92  soft-tissue]
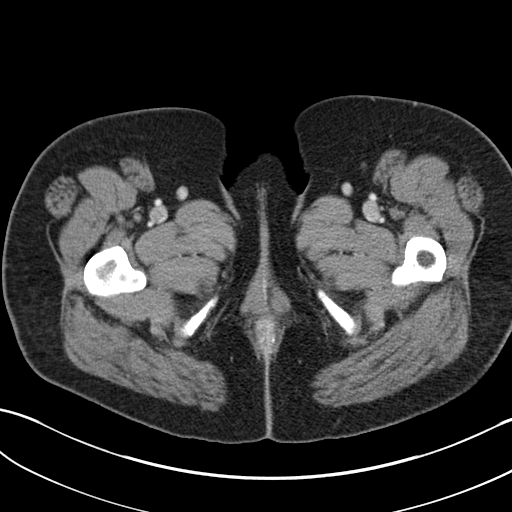
[im 7/92  bone]
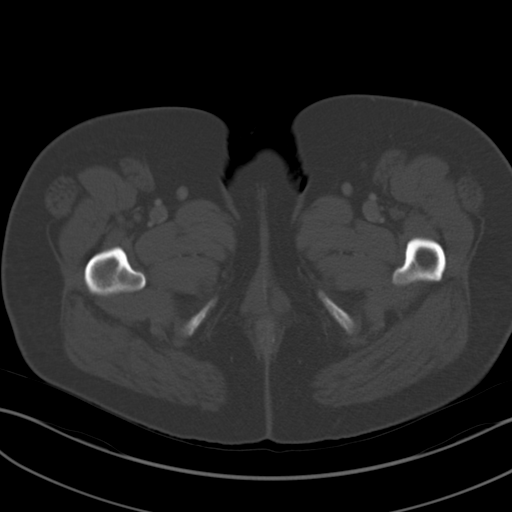
[im 14/92  soft-tissue]
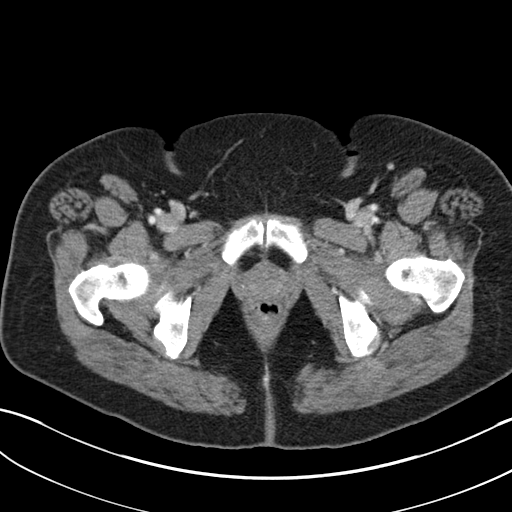
[im 20/92  soft-tissue]
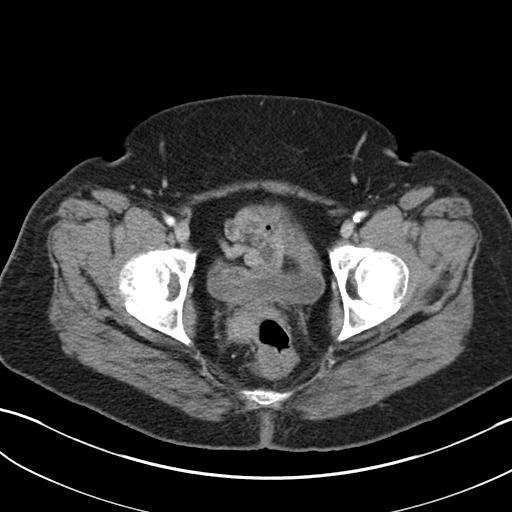
[im 27/92  soft-tissue]
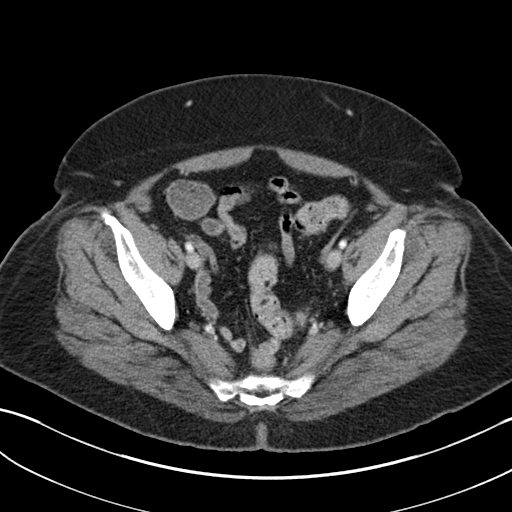
[im 33/92  soft-tissue]
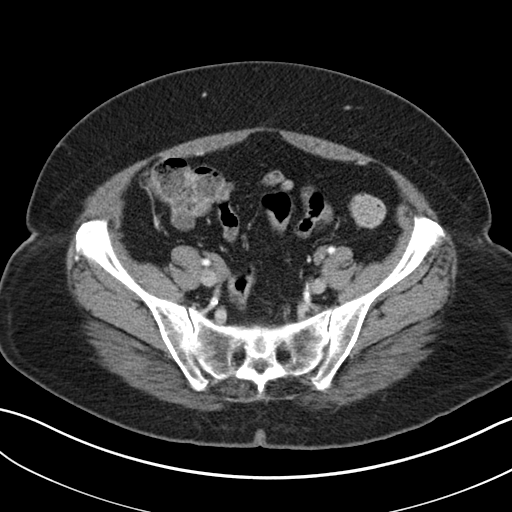
[im 40/92  soft-tissue]
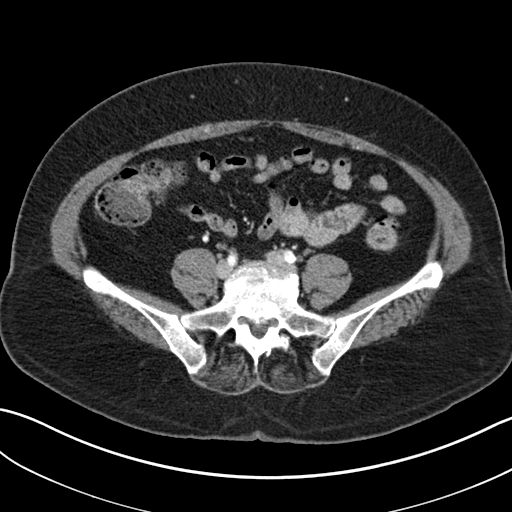
[im 46/92  soft-tissue]
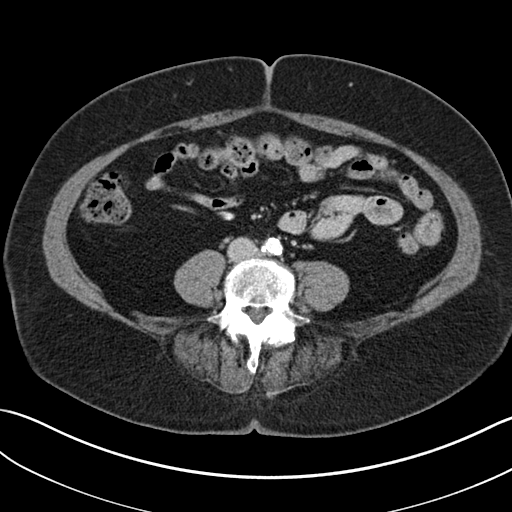
[im 53/92  soft-tissue]
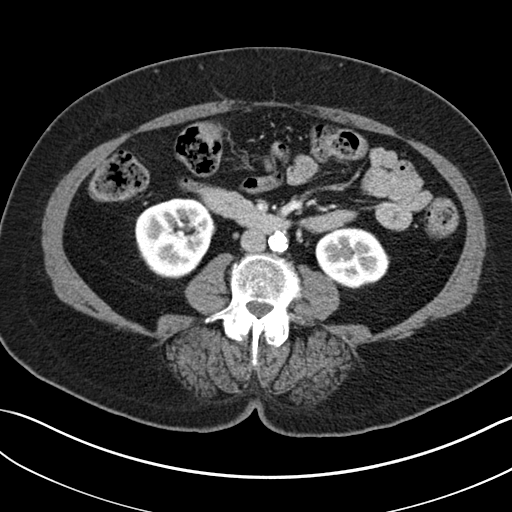
[im 59/92  soft-tissue]
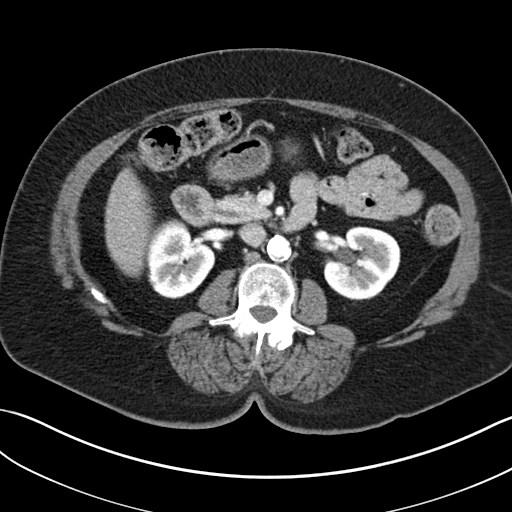
[im 59/92  bone]
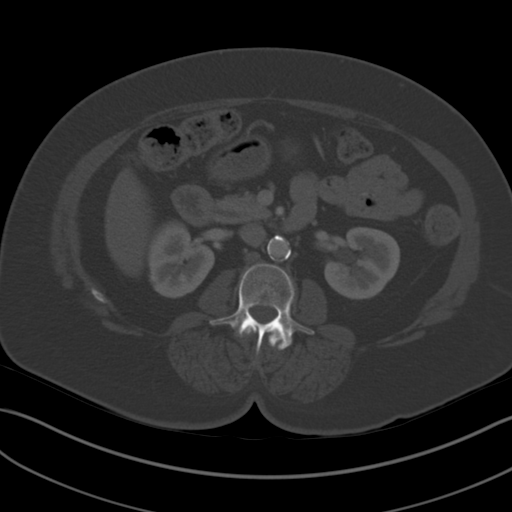
[im 66/92  soft-tissue]
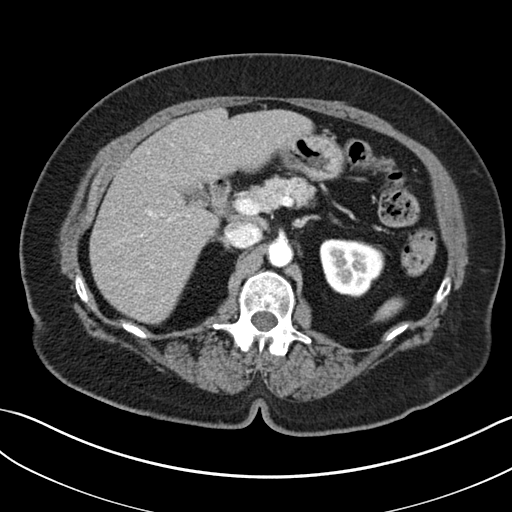
[im 72/92  soft-tissue]
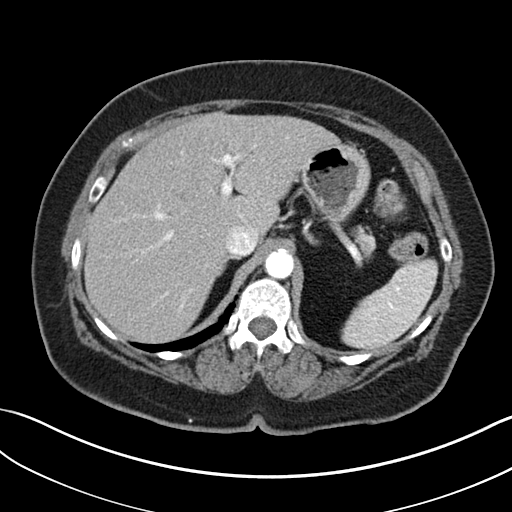
[im 79/92  soft-tissue]
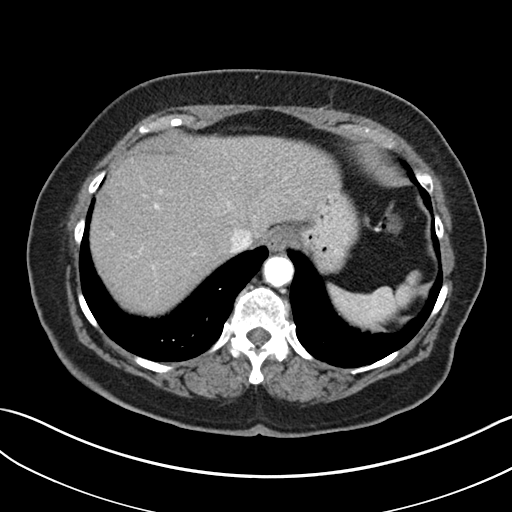
[im 85/92  soft-tissue]
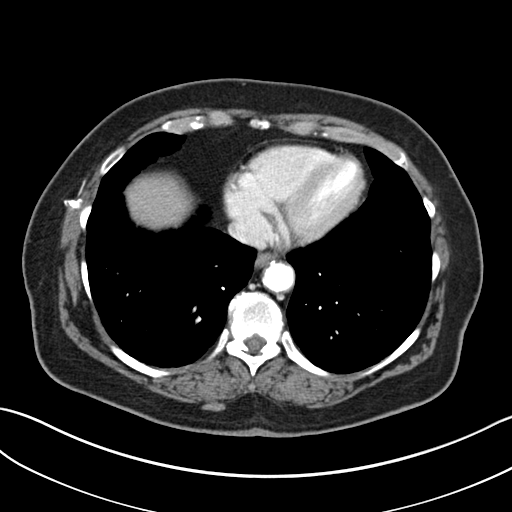

[Series 5: coronal st · coronal · 0.87mm/px · 3 of 150 slices shown]
[im 50/150  soft-tissue]
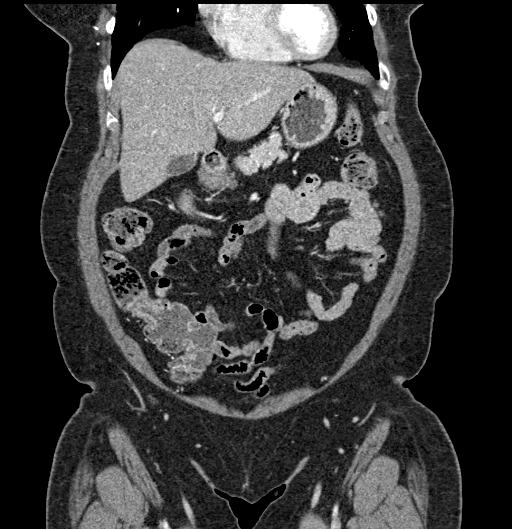
[im 67/150  soft-tissue]
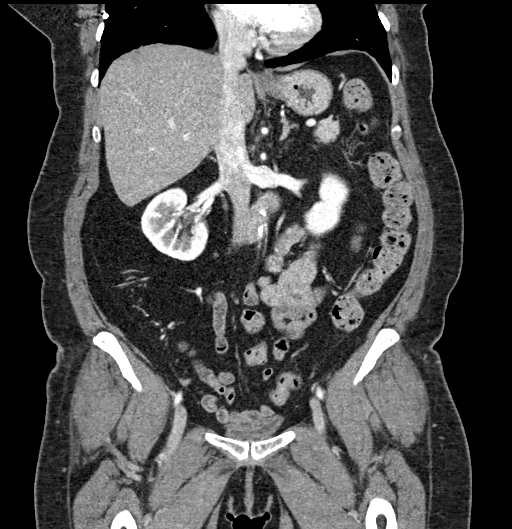
[im 83/150  soft-tissue]
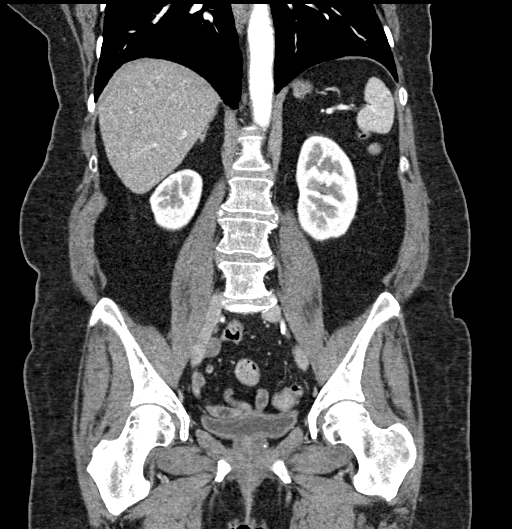

[16 of 46 positions shown; findings below may reference images not displayed]

RADIATION DOSE REDUCTION: This exam was performed according to the
departmental dose-optimization program which includes automated
exposure control, adjustment of the mA and/or kV according to
patient size and/or use of iterative reconstruction technique.

CONTRAST:  100mL OMNIPAQUE IOHEXOL 300 MG/ML  SOLN
FINDINGS: Lower chest: Bibasilar dependent atelectasis. The visualized lung
bases are clear. Coronary vascular calcification.

No intra-abdominal free air or free fluid.

Hepatobiliary: No focal liver abnormality is seen. No gallstones,
gallbladder wall thickening, or biliary dilatation.

Pancreas: Unremarkable. No pancreatic ductal dilatation or
surrounding inflammatory changes.

Spleen: Normal in size without focal abnormality.

Adrenals/Urinary Tract: The adrenal glands are unremarkable. The
kidneys, visualized ureters, and urinary bladder appear
unremarkable.

Stomach/Bowel: There is no bowel obstruction or active inflammation.
The appendix is normal.

Vascular/Lymphatic: Advanced aortoiliac atherosclerotic disease. The
IVC is unremarkable. No portal venous gas. There is no adenopathy.

Reproductive: Hysterectomy.  No adnexal masses.

Other: None

Musculoskeletal: Osteopenia with degenerative changes of the spine.
Mild bilateral femoral head avascular necrosis. No cortical
collapse. No acute osseous pathology.
IMPRESSION: 1. No acute intra-abdominal or pelvic pathology. No bowel
obstruction. Normal appendix.
2. Aortic Atherosclerosis ([id number]).

## 2022-12-18 DIAGNOSIS — N760 Acute vaginitis: Secondary | ICD-10-CM | POA: Diagnosis not present

## 2022-12-18 DIAGNOSIS — N390 Urinary tract infection, site not specified: Secondary | ICD-10-CM | POA: Diagnosis not present

## 2022-12-25 ENCOUNTER — Ambulatory Visit: Payer: PPO | Admitting: Adult Health

## 2022-12-26 ENCOUNTER — Telehealth: Payer: Self-pay | Admitting: *Deleted

## 2022-12-26 ENCOUNTER — Encounter: Payer: Self-pay | Admitting: Adult Health

## 2022-12-26 ENCOUNTER — Ambulatory Visit (INDEPENDENT_AMBULATORY_CARE_PROVIDER_SITE_OTHER): Payer: PPO | Admitting: Adult Health

## 2022-12-26 DIAGNOSIS — J849 Interstitial pulmonary disease, unspecified: Secondary | ICD-10-CM

## 2022-12-26 DIAGNOSIS — J432 Centrilobular emphysema: Secondary | ICD-10-CM

## 2022-12-26 DIAGNOSIS — J9611 Chronic respiratory failure with hypoxia: Secondary | ICD-10-CM

## 2022-12-26 MED ORDER — ALBUTEROL SULFATE HFA 108 (90 BASE) MCG/ACT IN AERS: 1.0000 | INHALATION_SPRAY | Freq: Four times a day (QID) | RESPIRATORY_TRACT | 2 refills | Status: DC | PRN

## 2022-12-26 NOTE — Patient Instructions (Addendum)
Continue on Trelegy 1 puff daily, rinse after use Albuterol inhaler or neb as needed Activity as tolerated.  Follow up with Rheumatology next month  Continue on Oxygen 2l/m with activity, goal is to keep O2 sats >88-90%. Inspire At bedtime.  Follow up Dr. Vassie Loll  in 4 months with Spirometry with DLCO

## 2022-12-26 NOTE — Progress Notes (Unsigned)
@Patient  ID: [name] [name], female    DOB: [birth year], 67 y.o.   MRN: [id number]  Chief Complaint  Patient presents with   Follow-up    Referring provider: Nelwyn Salisbury, MD  HPI: 67 year old female former smoker followed for COPD with emphysema, interstitial lung disease and pulmonary nodule, chronic respiratory failure (started on oxygen April 2024) History of sleep apnea status post inspire device October 2020 managed by Dr. Mayford Knife, DJD/DDD with chronic back pain.    TEST/EVENTS :  Serology 06/2022 ENA RNP + 1.0   LHC 06/2022 LVEDP 25, non obs CAD   PFTs 05/2022 no airway obstruction, ratio 84, FEV1 100%, FVC 91%, TLC 99%, DLCO 13.73/74%   06/2022  HRCT chest>> probable UIP pattern, left upper lobe nodule unchanged   HRCT 01/2022 >> emphysema, probable UIP, dates back to 05/2016, unchanged left upper lobe nodule   CT chest 05/2021 showed emphysema, 13 mm left upper lobe nodule peripheral and bibasilar and peripheral reticular markings, 11 mm right paratracheal lymph node    CT chest with contrast 09/2021 -showed unchanged left upper lobe nodule and similar changes of ILD.   CT angiogram chest 11/2021 showed right paratracheal lymph node 1.3 cm, unchanged nodule and similar peripheral reticular opacities   04/2020 Inspire titration  07/2021 HST showed AHI of 18/hour on 2.7 V?  Overstimulation HST 11/2021 (on Inspire) mild, AHI 7/h ,low desat 86% on 1.8 V  12/27/2022 Follow up ; COPD, ILD , Lung nodule  Patient returns for 31-month follow-up.  Patient has underlying COPD with emphysema.  PFTs with no significant airflow obstruction.  Since last visit patient says her breathing is doing the same.  She remains on Trelegy daily.  Rare use of albuterol.  Says she gets short of breath with activities.  Especially with prolonged walking.  Last visit patient had some exertional desaturations was started on oxygen.  Patient needs a portable system as she cannot carry the heavy tanks so  therefore cannot use oxygen away from home.  Walk test today in the office shows O2 saturations on room air with ambulation at 88%.  Required oxygen 2 L pulsed with ambulation to keep O2 saturations greater than 88 to 90%.  Patient has underlying interstitial lung disease.  With probable UIP pattern on CT chest.  Last high-resolution CT chest January 2024 showed mild progression. ILD workup has shown weakly positive ANA in past and elevated ENA RNP Ab.  She has an appointment with rheumatology next month.  She does have chronic joint and back pain.  2D echo January 2024 showed normal EF, normal RV size.  Patient has minimal cough.  Does get short of breath with heavy activities.   Allergies  Allergen Reactions   Sulfonamide Derivatives Swelling    Facial swelling   Flexeril [Cyclobenzaprine] Other (See Comments)    Dizziness, made meniere's disease worse    Hydromorphone Itching   Morphine And Codeine Nausea And Vomiting   Sulfamethoxazole Other (See Comments)    Causes swelling     Immunization History  Administered Date(s) Administered   Influenza Split 04/26/2011   Influenza Whole 03/23/2010   Influenza,inj,Quad PF,6+ Mos 03/20/2013, 03/02/2015, 03/26/2016, 03/25/2017, 05/08/2019, 03/17/2020   Influenza-Unspecified 05/26/2014, 03/03/2018   Pneumococcal Conjugate-13 04/19/2016   Pneumococcal Polysaccharide-23 03/07/2018   Tdap 04/20/2013   Zoster Recombinant(Shingrix) 02/18/2019   Zoster, Live 03/26/2016    Past Medical History:  Diagnosis Date   Allergic rhinitis 01/23/2008   Angina pectoris 06/06/2016   Arthritis  Attention deficit hyperactivity disorder (ADHD) 02/07/2016   Back pain, lumbar 01/21/2009   Bell's palsy 02/06/2008   Bipolar disorder 12/13/2009   Chronic idiopathic constipation    Dizziness 04/16/2008   Edema, leg 01/21/2009   Epigastric pain    Essential hypertension 01/23/2008   Gastro-esophageal reflux disease without esophagitis 01/23/2008    Generalized anxiety disorder 01/23/2008   Headache 04/15/2007   Hidradenitis 10/04/2008   History of colonic polyps    Hyperlipidemia    Hypokalemia 07/23/2018   Insomnia 01/23/2008   Major depressive disorder 01/23/2008   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   Neck pain 05/06/2017   Obesity (BMI 30-39.9) 11/10/2015   OSA (obstructive sleep apnea) 04/15/2007   inspire implant; does not always remember to turn device on   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/2008   Qualifier: Diagnosis of  By: Clent Ridges MD, Tera Mater    PUD (peptic ulcer disease) 06/06/2016   Unspecified urinary incontinence 01/23/2008   Wears dentures    Wears glasses     Tobacco History: Social History   Tobacco Use  Smoking Status Former   Current packs/day: 0.00   Types: Cigarettes   Start date: 12/1990   Quit date: 12/2020   Years since quitting: 2.0  Smokeless Tobacco Never   Counseling given: Not Answered   Outpatient Medications Prior to Visit  Medication Sig Dispense Refill   acyclovir (ZOVIRAX) 400 MG tablet TAKE 1 TABLET BY MOUTH 4 TIMES A DAY AS NEEDED (Patient taking differently: Take 800 mg by mouth 2 (two) times daily as needed (for sores in nose).) 120 tablet 5   albuterol (PROVENTIL) (2.5 MG/3ML) 0.083% nebulizer solution Take 3 mLs (2.5 mg total) by nebulization every 6 (six) hours as needed for wheezing or shortness of breath. 75 mL 12   alprazolam (XANAX) 2 MG tablet TAKE 1 TABLET BY MOUTH 3 TIMES DAILY AS NEEDED FOR ANXIETY. 90 tablet 5   aspirin EC 81 MG tablet Take 81 mg by mouth daily. Swallow whole.     B Complex-C-Folic Acid (B-COMPLEX/VITAMIN C PO) Take 1 tablet by mouth daily.     Cholecalciferol (VITAMIN D3) 125 MCG (5000 UT) CAPS Take 5,000 Units by mouth once a week.     FLUoxetine (PROZAC) 40 MG capsule TAKE 1 CAPSULE (40 MG TOTAL) BY MOUTH DAILY. 90 capsule 1   Fluticasone-Umeclidin-Vilant (TRELEGY ELLIPTA) 100-62.5-25 MCG/ACT AEPB Inhale 1 puff into the lungs daily. 60 each 5   Magnesium 500 MG CAPS  Take 500 mg by mouth daily.     omeprazole (PRILOSEC) 20 MG capsule TAKE 1 CAPSULE BY MOUTH 2 TIMES DAILY BEFORE A MEAL. (Patient taking differently: Take 20 mg by mouth at bedtime.) 180 capsule 1   Oxycodone HCl 10 MG TABS Take 1 tablet (10 mg total) by mouth every 6 (six) hours as needed (pain). 120 tablet 0   polyethylene glycol (MIRALAX / GLYCOLAX) 17 g packet Use one packet daily (Patient taking differently: Take 17 g by mouth 2 (two) times daily as needed for moderate constipation.) 90 each 3   potassium chloride (KLOR-CON) 10 MEQ tablet Take 1 tablet (10 mEq total) by mouth daily. 90 tablet 0   rosuvastatin (CRESTOR) 40 MG tablet TAKE 1 TABLET (40 MG TOTAL) BY MOUTH DAILY. 90 tablet 3   triamterene-hydrochlorothiazide (DYAZIDE) 37.5-25 MG capsule Take 1 capsule by mouth every morning.     ezetimibe (ZETIA) 10 MG tablet Take 10 mg by mouth daily. (Patient not taking: Reported on 07/19/2022)     No facility-administered  medications prior to visit.     Review of Systems:   Constitutional:   No  weight loss, night sweats,  Fevers, chills, + fatigue, or  lassitude.  HEENT:   No headaches,  Difficulty swallowing,  Tooth/dental problems, or  Sore throat,                No sneezing, itching, ear ache, nasal congestion, post nasal drip,   CV:  No chest pain,  Orthopnea, PND, swelling in lower extremities, anasarca, dizziness, palpitations, syncope.   GI  No heartburn, indigestion, abdominal pain, nausea, vomiting, diarrhea, change in bowel habits, loss of appetite, bloody stools.   Resp:   No chest wall deformity  Skin: no rash or lesions.  GU: no dysuria, change in color of urine, no urgency or frequency.  No flank pain, no hematuria   MS: Chronic back pain.    Physical Exam  BP 124/78 (BP Location: Left Arm, Patient Position: Sitting, Cuff Size: Large)   Pulse 80   Temp 98.2 F (36.8 C) (Oral)   Ht 5\' 2"  (1.575 m)   Wt 187 lb 6.4 oz (85 kg)   SpO2 95%   BMI 34.28 kg/m    GEN: A/Ox3; pleasant , NAD, well nourished    HEENT:  [place]/AT,   NOSE-clear, THROAT-clear, no lesions, no postnasal drip or exudate noted.   NECK:  Supple w/ fair ROM; no JVD; normal carotid impulses w/o bruits; no thyromegaly or nodules palpated; no lymphadenopathy.    RESP  Clear  P & A; w/o, wheezes/ rales/ or rhonchi. no accessory muscle use, no dullness to percussion  CARD:  RRR, no m/r/g, no peripheral edema, pulses intact, no cyanosis or clubbing.  GI:   Soft & nt; nml bowel sounds; no organomegaly or masses detected.   Musco: Warm bil, no deformities or joint swelling noted.   Neuro: alert, no focal deficits noted.    Skin: Warm, no lesions or rashes    Lab Results:  CBC   BNP   ProBNP No results found for: "PROBNP"  Imaging: No results found.  Administration History     None          Latest Ref Rng & Units 06/08/2022    9:58 AM  PFT Results  FVC-Pre L 2.63   FVC-Predicted Pre % 91   FVC-Post L 2.70   FVC-Predicted Post % 93   Pre FEV1/FVC % % 84   Post FEV1/FCV % % 87   FEV1-Pre L 2.21   FEV1-Predicted Pre % 100   FEV1-Post L 2.34   DLCO uncorrected ml/min/mmHg 13.73   DLCO UNC% % 74   DLCO corrected ml/min/mmHg 13.73   DLCO COR %Predicted % 74   DLVA Predicted % 76   TLC L 4.72   TLC % Predicted % 99   RV % Predicted % 53     No results found for: "NITRICOXIDE"      Assessment & Plan:   Centrilobular emphysema (HCC) Appears stable on Trelegy.  No changes.  Plan  Patient Instructions  Continue on Trelegy 1 puff daily, rinse after use Albuterol inhaler or neb as needed Activity as tolerated.  Follow up with Rheumatology next month  Continue on Oxygen 2l/m with activity, goal is to keep O2 sats >88-90%. Inspire At bedtime.  Follow up Dr. Vassie Loll  in 4 months with Spirometry with DLCO       ILD (interstitial lung disease) (HCC) Interstitial lung disease with probable UIP pattern on  CT chest.  Most recent CT chest showed  mild progression.  Autoimmune/connective tissue serology did show mildly positive ANA and ENA RNP antibody-she has an appointment with rheumatology next month.  Will check spirometry with DLCO on return visit.  Once rheumatology workup is complete can consider treatment plan.  Recent echo-showed normal RV size.  If continues to have increased oxygen demand could consider referral for right heart cath to rule out pulmonary hypertension  Plan  Patient Instructions  Continue on Trelegy 1 puff daily, rinse after use Albuterol inhaler or neb as needed Activity as tolerated.  Follow up with Rheumatology next month  Continue on Oxygen 2l/m with activity, goal is to keep O2 sats >88-90%. Inspire At bedtime.  Follow up Dr. Vassie Loll  in 4 months with Spirometry with DLCO         Rubye Oaks, NP 12/27/2022

## 2022-12-26 NOTE — Telephone Encounter (Signed)
ATC patient to verify which DME company she wants the order for the POC to go to.  LVM to return call.

## 2022-12-27 NOTE — Assessment & Plan Note (Addendum)
Continue on oxygen to maintain O2 saturations greater than 88 to 90%. Order for POC.  

## 2022-12-27 NOTE — Telephone Encounter (Signed)
Called [facility] to verify if she gets her oxygen through them.  Verified that she uses [place] for her oxygen.  Order sent.  Nothing further needed.

## 2022-12-27 NOTE — Assessment & Plan Note (Signed)
Appears stable on Trelegy.  No changes.  Plan  Patient Instructions  Continue on Trelegy 1 puff daily, rinse after use Albuterol inhaler or neb as needed Activity as tolerated.  Follow up with Rheumatology next month  Continue on Oxygen 2l/m with activity, goal is to keep O2 sats >88-90%. Inspire At bedtime.  Follow up Dr. Vassie Loll  in 4 months with Spirometry with DLCO

## 2022-12-27 NOTE — Assessment & Plan Note (Signed)
Interstitial lung disease with probable UIP pattern on CT chest.  Most recent CT chest showed mild progression.  Autoimmune/connective tissue serology did show mildly positive ANA and ENA RNP antibody-she has an appointment with rheumatology next month.  Will check spirometry with DLCO on return visit.  Once rheumatology workup is complete can consider treatment plan.  Recent echo-showed normal RV size.  If continues to have increased oxygen demand could consider referral for right heart cath to rule out pulmonary hypertension  Plan  Patient Instructions  Continue on Trelegy 1 puff daily, rinse after use Albuterol inhaler or neb as needed Activity as tolerated.  Follow up with Rheumatology next month  Continue on Oxygen 2l/m with activity, goal is to keep O2 sats >88-90%. Inspire At bedtime.  Follow up Dr. Vassie Loll  in 4 months with Spirometry with DLCO

## 2023-01-02 DIAGNOSIS — J9611 Chronic respiratory failure with hypoxia: Secondary | ICD-10-CM | POA: Diagnosis not present

## 2023-01-03 ENCOUNTER — Encounter: Payer: Self-pay | Admitting: Licensed Clinical Social Worker

## 2023-01-07 ENCOUNTER — Telehealth: Payer: Self-pay | Admitting: Licensed Clinical Social Worker

## 2023-01-09 ENCOUNTER — Telehealth: Payer: Self-pay | Admitting: Licensed Clinical Social Worker

## 2023-01-09 NOTE — Patient Instructions (Signed)
Visit Information  Thank you for taking time to visit with me today. Please don't hesitate to contact me if I can be of assistance to you.   Following are the goals we discussed today:   Goals Addressed             This Visit's Progress    Management of MH Conditions       Activities and task to complete in order to accomplish goals.   Keep all upcoming appointments discussed today Continue with compliance of taking medication prescribed by Doctor and utilizing oxygen machine Implement healthy coping skills discussed to assist with management of symptoms Continue working with [facility] care team to assist with goals identified Review listing of mental health professionals that provide psychoanalytic services             Our next appointment is by telephone on 07/31 at 3 PM  Please call the care guide team at [phone] if you need to cancel or reschedule your appointment.   If you are experiencing a Mental Health or Behavioral Health Crisis or need someone to talk to, please call the Suicide and Crisis Lifeline: 988 call 911   Patient verbalizes understanding of instructions and care plan provided today and agrees to view in MyChart. Active MyChart status and patient understanding of how to access instructions and care plan via MyChart confirmed with patient.     [name], MSW, LCSW [facility] Care Management [place]  [facility] [email or web address] Phone [phone] 3:41 PM

## 2023-01-09 NOTE — Patient Outreach (Signed)
  Care Coordination   Follow Up Visit Note   01/09/2023 Name: [name] [name] MRN: [id number] DOB: 1955-08-10  [name] [name] is a 67 y.o. year old female who sees Nelwyn Salisbury, MD for primary care. I spoke with  [name] [name] by email today.  What matters to the patients health and wellness today?  MH resources    Goals Addressed             This Visit's Progress    Management of MH Conditions       Activities and task to complete in order to accomplish goals.   Keep all upcoming appointments discussed today Continue with compliance of taking medication prescribed by Doctor and utilizing oxygen machine Implement healthy coping skills discussed to assist with management of symptoms Continue working with [facility] care team to assist with goals identified Review listing of mental health professionals that provide psychoanalytic services             SDOH assessments and interventions completed:  No     Care Coordination Interventions:  Yes, provided  Interventions Today    Flowsheet Row Most Recent Value  Chronic Disease   Chronic disease during today's visit Other  [Bipolar, MDD, GAD, ADHD]  Mental Health Interventions   Mental Health Discussed/Reviewed Anxiety, Depression, Coping Strategies  [LCSW provided listing of mental health professionals that provide psychoanalytic services, per pt request, via email]       Follow up plan: Follow up call scheduled for 2-4 weeks    Encounter Outcome:  Pt. Visit Completed   [name], MSW, LCSW [facility] Care Management [facility] Health  [facility] [email or web address] Phone [phone] 3:40 PM

## 2023-01-09 NOTE — Patient Instructions (Signed)
Visit Information  Thank you for taking time to visit with me today. Please don't hesitate to contact me if I can be of assistance to you.   Following are the goals we discussed today:   Goals Addressed             This Visit's Progress    Management of MH Conditions   On track    Activities and task to complete in order to accomplish goals.   Keep all upcoming appointments discussed today Continue with compliance of taking medication prescribed by Doctor and utilizing oxygen machine Implement healthy coping skills discussed to assist with management of symptoms Continue working with [facility] care team to assist with goals identified Review listing of mental health professionals that provide psychoanalytic services             Our next appointment is by telephone on 07/31 at 3 PM  Please call the care guide team at [phone] if you need to cancel or reschedule your appointment.   If you are experiencing a Mental Health or Behavioral Health Crisis or need someone to talk to, please call the Suicide and Crisis Lifeline: 988 call 911   Patient verbalizes understanding of instructions and care plan provided today and agrees to view in MyChart. Active MyChart status and patient understanding of how to access instructions and care plan via MyChart confirmed with patient.     [name], MSW, LCSW [facility] Care Management [place]  [facility] [email or web address] Phone [phone] 3:52 PM

## 2023-01-09 NOTE — Patient Outreach (Signed)
  Care Coordination   Follow Up Visit Note   01/07/2023 Name: [name] [name] MRN: [id number] DOB: 08/10/1955  [name] [name] is a 67 y.o. year old female who sees Nelwyn Salisbury, MD for primary care. I spoke with  [name] [name] by phone today.  What matters to the patients health and wellness today?  Symptom Management    Goals Addressed             This Visit's Progress    Management of MH Conditions   On track    Activities and task to complete in order to accomplish goals.   Keep all upcoming appointments discussed today Continue with compliance of taking medication prescribed by Doctor and utilizing oxygen machine Implement healthy coping skills discussed to assist with management of symptoms Continue working with [facility] care team to assist with goals identified Review listing of mental health professionals that provide psychoanalytic services             SDOH assessments and interventions completed:  No     Care Coordination Interventions:  Yes, provided  Interventions Today    Flowsheet Row Most Recent Value  Chronic Disease   Chronic disease during today's visit Other  [Bell's Palsy, Meniere's Disease, Chronic Pain, Bipolar, MDD, GAD, ADHD]  General Interventions   General Interventions Discussed/Reviewed General Interventions Reviewed, Doctor Visits  [Patient endorses increase in stress symptoms triggered by managing chronic health conditions.]  Doctor Visits Discussed/Reviewed Doctor Visits Reviewed  Exercise Interventions   Exercise Discussed/Reviewed Exercise Reviewed, Physical Activity  Mental Health Interventions   Mental Health Discussed/Reviewed Mental Health Reviewed, Coping Strategies, Anxiety, Depression  [Interested in psychoanalytic provider to assist with mental health conditions]  Nutrition Interventions   Nutrition Discussed/Reviewed Nutrition Reviewed  Pharmacy Interventions   Pharmacy Dicussed/Reviewed Pharmacy Topics Reviewed,  Medication Adherence  [Pt reports some medications were causing worsening balance, cognitive functioning, and GI symptoms resulting in discontinuing. She has upcoming appts to address with providers]  Safety Interventions   Safety Discussed/Reviewed Safety Reviewed, Fall Risk       Follow up plan: Follow up call scheduled for 7/31    Encounter Outcome:  Pt. Visit Completed   [name], MSW, LCSW [facility] Care Management [facility] Health  [facility] [email or web address] Phone [phone] 3:52 PM

## 2023-01-11 ENCOUNTER — Ambulatory Visit: Payer: PPO | Attending: Internal Medicine | Admitting: Internal Medicine

## 2023-01-11 ENCOUNTER — Encounter: Payer: Self-pay | Admitting: Internal Medicine

## 2023-01-11 VITALS — BP 140/83 | HR 60 | Resp 14 | Ht 63.5 in | Wt 196.0 lb

## 2023-01-11 DIAGNOSIS — R768 Other specified abnormal immunological findings in serum: Secondary | ICD-10-CM | POA: Diagnosis not present

## 2023-01-11 DIAGNOSIS — G8929 Other chronic pain: Secondary | ICD-10-CM

## 2023-01-11 DIAGNOSIS — N760 Acute vaginitis: Secondary | ICD-10-CM | POA: Diagnosis not present

## 2023-01-11 DIAGNOSIS — J849 Interstitial pulmonary disease, unspecified: Secondary | ICD-10-CM | POA: Diagnosis not present

## 2023-01-11 DIAGNOSIS — M5440 Lumbago with sciatica, unspecified side: Secondary | ICD-10-CM | POA: Diagnosis not present

## 2023-01-11 DIAGNOSIS — M5441 Lumbago with sciatica, right side: Secondary | ICD-10-CM

## 2023-01-11 DIAGNOSIS — Z6835 Body mass index (BMI) 35.0-35.9, adult: Secondary | ICD-10-CM | POA: Diagnosis not present

## 2023-01-11 DIAGNOSIS — R7689 Other specified abnormal immunological findings in serum: Secondary | ICD-10-CM

## 2023-01-11 DIAGNOSIS — Z01419 Encounter for gynecological examination (general) (routine) without abnormal findings: Secondary | ICD-10-CM | POA: Diagnosis not present

## 2023-01-11 DIAGNOSIS — M546 Pain in thoracic spine: Secondary | ICD-10-CM

## 2023-01-11 DIAGNOSIS — Z1231 Encounter for screening mammogram for malignant neoplasm of breast: Secondary | ICD-10-CM | POA: Diagnosis not present

## 2023-01-11 LAB — C3 AND C4
C3 Complement: 159 mg/dL (ref 83–193)
C4 Complement: 18 mg/dL (ref 15–57)

## 2023-01-11 LAB — SEDIMENTATION RATE: Sed Rate: 38 mm/h — ABNORMAL HIGH (ref 0–30)

## 2023-01-11 NOTE — Progress Notes (Unsigned)
Office Visit Note  Patient: [name] [name]             Date of Birth: 12-28-1955           MRN: [id number]             PCP: Nelwyn Salisbury, MD Referring: [name], MD Visit Date: 01/11/2023   Subjective:  New Patient (Initial Visit) (Patient states she has pain all over. Patient states she cannot walk straight without her walker. Patient states her oxygen saturation is not great. Patient states she feels she has lost the quality of life. Patient states her lungs are not good. Patient states her back is in a lot of pain. )   History of Present Illness: Pipper Underhill is a 67 y.o. female here for evaluation with positive ANA and RNP Abs associated with ILD. ***   Back pain longstanding Nervous breakdown in 2009, dizziness Saw ENT negtaibv for menieres, discussed shunt never tried Thoracic/chest pain around 2 yrs Did not improive with pain medicine Imprived with albuterol CT findings Sees dr d=cram last 3 mos ago MRI lumbar spine DJd Back sugeries Wakes with hand and fot swelling, less swelling into the day Uses walker for dizzinesss long term, also back pain 2ndary ***   Activities of Daily Living:  Patient reports morning stiffness for 20 minutes.   Patient Reports nocturnal pain.  Difficulty dressing/grooming: Reports Difficulty climbing stairs: Reports Difficulty getting out of chair: Reports Difficulty using hands for taps, buttons, cutlery, and/or writing: Reports  Review of Systems  Constitutional:  Positive for fatigue.  HENT:  Positive for mouth dryness. Negative for mouth sores.   Eyes:  Positive for dryness.  Respiratory:  Positive for shortness of breath.   Cardiovascular:  Positive for chest pain. Negative for palpitations.  Gastrointestinal:  Positive for constipation and diarrhea. Negative for blood in stool.  Endocrine: Positive for increased urination.  Genitourinary:  Positive for involuntary urination.  Musculoskeletal:  Positive for  joint pain, gait problem, joint pain, joint swelling, myalgias, muscle weakness, morning stiffness, muscle tenderness and myalgias.  Skin:  Positive for sensitivity to sunlight. Negative for color change, rash and hair loss.  Allergic/Immunologic: Negative for susceptible to infections.  Neurological:  Positive for dizziness and headaches.  Hematological:  Negative for swollen glands.  Psychiatric/Behavioral:  Positive for depressed mood and sleep disturbance. The patient is nervous/anxious.     PMFS History:  Patient Active Problem List   Diagnosis Date Noted   Positive ANA (antinuclear antibody) 01/11/2023   Chronic respiratory failure with hypoxia (HCC) 09/25/2022   Aorto-iliac atherosclerosis (HCC) 07/05/2022   Normocytic anemia 07/05/2022   Precordial chest pain 07/04/2022   Centrilobular emphysema (HCC) 06/26/2022   Chronic midline thoracic back pain 04/03/2022   Pulmonary nodule 1 cm or greater in diameter 01/18/2022   Bleeding external hemorrhoids 12/07/2021   ILD (interstitial lung disease) (HCC) 12/07/2021   Chronic idiopathic constipation    Epigastric pain    History of colonic polyps    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   Hypokalemia 07/23/2018   Neck pain 05/06/2017   Angina pectoris 06/06/2016   PUD (peptic ulcer disease) 06/06/2016   Attention deficit hyperactivity disorder (ADHD) 02/07/2016   Obesity (BMI 30-39.9) 11/10/2015   Bipolar disorder 12/13/2009   Back pain, lumbar 01/21/2009   Edema, leg 01/21/2009   Hidradenitis 10/04/2008   Dizziness 04/16/2008   Bell's palsy 02/06/2008   Generalized anxiety disorder 01/23/2008   Major depressive disorder 01/23/2008  Allergic rhinitis 01/23/2008   Gastro-esophageal reflux disease without esophagitis 01/23/2008   Insomnia 01/23/2008   Unspecified urinary incontinence 01/23/2008   Hyperlipidemia 04/15/2007   OSA (obstructive sleep apnea) 04/15/2007   Headache 04/15/2007    Past Medical History:  Diagnosis Date    Allergic rhinitis 01/23/2008   Angina pectoris 06/06/2016   Arthritis    Attention deficit hyperactivity disorder (ADHD) 02/07/2016   Back pain, lumbar 01/21/2009   Bell's palsy 02/06/2008   Bipolar disorder 12/13/2009   Chronic idiopathic constipation    Dizziness 04/16/2008   Edema, leg 01/21/2009   Epigastric pain    Essential hypertension 01/23/2008   Gastro-esophageal reflux disease without esophagitis 01/23/2008   Generalized anxiety disorder 01/23/2008   Headache 04/15/2007   Hidradenitis 10/04/2008   History of colonic polyps    Hyperlipidemia    Hypokalemia 07/23/2018   Insomnia 01/23/2008   Major depressive disorder 01/23/2008   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   Neck pain 05/06/2017   Obesity (BMI 30-39.9) 11/10/2015   OSA (obstructive sleep apnea) 04/15/2007   inspire implant; does not always remember to turn device on   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/2008   Qualifier: Diagnosis of  By: Clent Ridges MD, Tera Mater    PUD (peptic ulcer disease) 06/06/2016   Unspecified urinary incontinence 01/23/2008   Wears dentures    Wears glasses     Family History  Problem Relation Age of Onset   Dementia Mother    Hyperlipidemia Mother    Asthma Father    Hypertension Sister    Hyperlipidemia Sister    Coronary artery disease Sister    Thyroid disease Sister    Hypertension Sister    Hyperlipidemia Sister    Hypertension Brother    Hyperlipidemia Brother    Coronary artery disease Brother    Alcohol abuse Other        fhx   Arthritis Other        fhx   Coronary artery disease Other        fhx   Depression Other        fhx   Diabetes Other        fhx   Hyperlipidemia Other        fhx   Hypertension Other        fhx   Sudden death Other        fhx   Past Surgical History:  Procedure Laterality Date   ABDOMINAL HYSTERECTOMY     ANTERIOR FUSION CERVICAL SPINE  2006   Dr. Wynetta Emery   BREAST BIOPSY Right 1981   benign   BREAST EXCISIONAL BIOPSY Right 1981   benign   scar does not  show   BREAST SURGERY Bilateral 12/11/2012   reductions per Dr. Ivin Booty in [facility]    CATARACT EXTRACTION, BILATERAL Bilateral last summer of 2018   at [facility]   COLONOSCOPY  03/07/2021   per Dr. Loreta Ave, adenomatous polyps, repeat in 7 yrs   DRUG INDUCED ENDOSCOPY N/A 01/30/2019   Procedure: DRUG INDUCED ENDOSCOPY;  Surgeon: Christia Reading, MD;  Location: [facility];  Service: ENT;  Laterality: N/A;   ESOPHAGOGASTRODUODENOSCOPY  04/23/2011   per Dr. Loreta Ave, normal    EYE SURGERY  06/18/2016   L eyelid    IMPLANTATION OF HYPOGLOSSAL NERVE STIMULATOR Right 03/25/2019   Procedure: IMPLANTATION OF HYPOGLOSSAL NERVE STIMULATOR;  Surgeon: Christia Reading, MD;  Location: [facility] OR;  Service: ENT;  Laterality: Right;   LEFT HEART CATH AND CORONARY ANGIOGRAPHY  N/A 07/05/2022   Procedure: LEFT HEART CATH AND CORONARY ANGIOGRAPHY;  Surgeon: [place]az[place], Peter M, MD;  Location: [facility] INVASIVE CV LAB;  Service: Cardiovascular;  Laterality: N/A;   LUMBAR DISC SURGERY  2003   Dr. Wynetta Emery   MULTIPLE TOOTH EXTRACTIONS     ORIF WRIST FRACTURE Right 08/29/2018   REDUCTION MAMMAPLASTY Bilateral 2013   SPLIT NIGHT STUDY  11/11/2015   UVULOPALATOPHARYNGOPLASTY     VAGINAL HYSTERECTOMY  05/1995   secondary to fibroids.   Social History   Social History Narrative   Pt lives in [place], [place].   Right handed    Occasional use of caffeine   Lives with ex-husband         07/10/2018: Lives with ex-husband on one-level house. Divorced, according to husband, for financial benefit only    Has two children, one local and one in [place]. Has several grandchildren locally who can help her occasionally if needed.   Immunization History  Administered Date(s) Administered   Influenza Split 04/26/2011   Influenza Whole 03/23/2010   Influenza,inj,Quad PF,6+ Mos 03/20/2013, 03/02/2015, 03/26/2016, 03/25/2017, 05/08/2019, 03/17/2020   Influenza-Unspecified 05/26/2014, 03/03/2018   Pneumococcal Conjugate-13  04/19/2016   Pneumococcal Polysaccharide-23 03/07/2018   Tdap 04/20/2013   Zoster Recombinant(Shingrix) 02/18/2019   Zoster, Live 03/26/2016     Objective: Vital Signs: BP (!) 140/83 (BP Location: Right Arm, Patient Position: Sitting, Cuff Size: Normal)   Pulse 60   Resp 14   Ht 5' 3.5" (1.613 m)   Wt 196 lb (88.9 kg)   BMI 34.18 kg/m    Physical Exam Constitutional:      Appearance: She is obese.  HENT:     Mouth/Throat:     Comments: Flattening of lingual papillae Eyes:     Conjunctiva/sclera: Conjunctivae normal.  Cardiovascular:     Rate and Rhythm: Normal rate and regular rhythm.  Pulmonary:     [place]: Pulmonary effort is normal.     Breath sounds: Normal breath sounds.  Musculoskeletal:     Right lower leg: Edema present.     Left lower leg: Edema present.     Comments: 1+ pitting edema both ankles  Lymphadenopathy:     Cervical: No cervical adenopathy.  Skin:    General: Skin is warm and dry.     Findings: No rash.  Neurological:     General: No focal deficit present.     Mental Status: She is alert.  Psychiatric:        Mood and Affect: Mood normal.      Musculoskeletal Exam:  Neck full ROM no tenderness Shoulders full ROM no tenderness or swelling Elbows full ROM no tenderness or swelling Wrists full ROM no tenderness or swelling Fingers full ROM no tenderness or swelling Knees full ROM no tenderness or swelling Middle back paraspinal muscle tenderness to pressure without radiation, low back postsurgical changes  Investigation: No additional findings.  Imaging: No results found.  Recent Labs: Lab Results  Component Value Date   WBC 5.5 07/05/2022   HGB 10.8 (L) 07/05/2022   PLT 145 (L) 07/05/2022   NA 138 07/04/2022   K 3.6 07/04/2022   CL 102 07/04/2022   CO2 26 07/04/2022   GLUCOSE 98 07/04/2022   BUN 24 (H) 07/04/2022   CREATININE 0.75 07/04/2022   BILITOT 0.5 07/04/2022   ALKPHOS 48 07/04/2022   AST 33 07/04/2022   ALT 22  07/04/2022   PROT 7.0 07/04/2022   ALBUMIN 3.8 07/04/2022   CALCIUM 8.9  07/04/2022   GFRAA >60 03/23/2019    Speciality Comments: No specialty comments available.  Procedures:  No procedures performed Allergies: Sulfonamide derivatives, Flexeril [cyclobenzaprine], Hydromorphone, Morphine and codeine, and Sulfamethoxazole   Assessment / Plan:     Visit Diagnoses: Positive ANA (antinuclear antibody) - Plan: Sedimentation rate, C-reactive protein, IgG, IgA, IgM, RNP Antibody, C3 and C4, Chromatin (Nucleosomal) Antibody  ILD (interstitial lung disease) (HCC)  Chronic midline thoracic back pain  Chronic bilateral low back pain with sciatica, sciatica laterality unspecified  Orders: Orders Placed This Encounter  Procedures   Sedimentation rate   C-reactive protein   IgG, IgA, IgM   RNP Antibody   C3 and C4   Chromatin (Nucleosomal) Antibody   No orders of the defined types were placed in this encounter.   Face-to-face time spent with patient was *** minutes. Greater than 50% of time was spent in counseling and coordination of care.  Follow-Up Instructions: No follow-ups on file.   Fuller Plan, MD  Note - This record has been created using [employer].  Chart creation errors have been sought, but may not always  have been located. Such creation errors do not reflect on  the standard of medical care.

## 2023-01-15 ENCOUNTER — Other Ambulatory Visit: Payer: Self-pay | Admitting: Obstetrics & Gynecology

## 2023-01-15 DIAGNOSIS — R928 Other abnormal and inconclusive findings on diagnostic imaging of breast: Secondary | ICD-10-CM

## 2023-01-16 ENCOUNTER — Ambulatory Visit: Payer: Self-pay | Admitting: Licensed Clinical Social Worker

## 2023-01-16 ENCOUNTER — Telehealth: Payer: Self-pay | Admitting: *Deleted

## 2023-01-16 NOTE — Telephone Encounter (Signed)
Patient's spouse Melvern Sample contacted the office on the patient's behalf. He is not on dpr but patient did give verbal permission for [place] to speak with [name]. Advised patient to add to dpr at next visit.   John would like to discuss the abnormal lab results and what the follow up should be. He states the patient is in pain and "going through events." Please review results and advise.

## 2023-01-16 NOTE — Telephone Encounter (Signed)
Addressed in associated result note today.

## 2023-01-16 NOTE — Progress Notes (Signed)
Sedimentation rate is 38 this is mildly above normal but unchanged compared to remote previous labs she had check 15 years ago.  RNP antibody remains borderline at 1.1.  CRP complements and immunoglobulin levels are all completely normal.  I do not think these findings indicate a underlying systemic inflammatory disease as a cause of her back pain or interstitial lung disease.  I think she needs to follow-up with her neurosurgery or other pain management office for the ongoing back pain.

## 2023-01-17 ENCOUNTER — Other Ambulatory Visit: Payer: Self-pay | Admitting: Family Medicine

## 2023-01-17 NOTE — Patient Outreach (Signed)
  Care Coordination   Follow Up Visit Note   01/16/2023 Name: Iylah Ghuman MRN: [id number] DOB: 1955/08/23  Hettie Gallmeyer is a 67 y.o. year old female who sees Nelwyn Salisbury, MD for primary care. I spoke with  [name] by phone today.  What matters to the patients health and wellness today?  Pain management, MH Symptom management    Goals Addressed             This Visit's Progress    Management of MH Conditions   On track    Activities and task to complete in order to accomplish goals.   Keep all upcoming appointments discussed today Continue with compliance of taking medication prescribed by Doctor and utilizing oxygen machine. F/up with preferred pharmacy about medication refills Implement healthy coping skills discussed to assist with management of symptoms Continue working with [facility] care team to assist with goals identified Review listing of mental health professionals that provide psychoanalytic services             SDOH assessments and interventions completed:  No     Care Coordination Interventions:  Yes, provided  Interventions Today    Flowsheet Row Most Recent Value  Chronic Disease   Chronic disease during today's visit Other  [Bipolar, GAD, MDD, ADHD]  General Interventions   General Interventions Discussed/Reviewed General Interventions Reviewed, Labs  [Pt reports difficulty managing chronic health conditions. Endorses continued cognitive decline and limited mobility due to pulmonary conditions. Family are awaiting a callback from rheumatologist to inform of lab results.]  Doctor Visits Discussed/Reviewed Doctor Visits Reviewed, Specialist, PCP  Exercise Interventions   Exercise Discussed/Reviewed Exercise Reviewed, Physical Activity  Mental Health Interventions   Mental Health Discussed/Reviewed Mental Health Reviewed, Coping Strategies, Depression, Anxiety  Nutrition Interventions   Nutrition Discussed/Reviewed Nutrition  Reviewed  Pharmacy Interventions   Pharmacy Dicussed/Reviewed Pharmacy Topics Reviewed, Medication Adherence  Safety Interventions   Safety Discussed/Reviewed Safety Reviewed, Fall Risk, Home Safety       Follow up plan: Follow up call scheduled for 1-3 weeks    Encounter Outcome:  Pt. Visit Completed   [name], MSW, LCSW [facility] Care Management [facility] Health  [facility] [email or web address] Phone [phone] 3:12 PM

## 2023-01-17 NOTE — Patient Instructions (Signed)
Visit Information  Thank you for taking time to visit with me today. Please don't hesitate to contact me if I can be of assistance to you.   Following are the goals we discussed today:   Goals Addressed             This Visit's Progress    Management of MH Conditions   On track    Activities and task to complete in order to accomplish goals.   Keep all upcoming appointments discussed today Continue with compliance of taking medication prescribed by Doctor and utilizing oxygen machine. F/up with preferred pharmacy about medication refills Implement healthy coping skills discussed to assist with management of symptoms Continue working with [facility] care team to assist with goals identified Review listing of mental health professionals that provide psychoanalytic services             Our next appointment is by telephone on 08/27 at 3:30 PM  Please call the care guide team at [phone] if you need to cancel or reschedule your appointment.   If you are experiencing a Mental Health or Behavioral Health Crisis or need someone to talk to, please call the Suicide and Crisis Lifeline: 988 call 911   Patient verbalizes understanding of instructions and care plan provided today and agrees to view in MyChart. Active MyChart status and patient understanding of how to access instructions and care plan via MyChart confirmed with patient.     [name], MSW, LCSW [facility] Care Management [place]  [facility] [email or web address] Phone [phone] 3:14 PM

## 2023-01-21 ENCOUNTER — Emergency Department (HOSPITAL_BASED_OUTPATIENT_CLINIC_OR_DEPARTMENT_OTHER)
Admission: EM | Admit: 2023-01-21 | Discharge: 2023-01-21 | Disposition: A | Discharge status: Home / Self Care | Payer: PPO | Attending: Emergency Medicine | Admitting: Emergency Medicine

## 2023-01-21 ENCOUNTER — Other Ambulatory Visit: Payer: Self-pay

## 2023-01-21 ENCOUNTER — Emergency Department (HOSPITAL_BASED_OUTPATIENT_CLINIC_OR_DEPARTMENT_OTHER): Payer: PPO | Admitting: Radiology

## 2023-01-21 ENCOUNTER — Encounter (HOSPITAL_BASED_OUTPATIENT_CLINIC_OR_DEPARTMENT_OTHER): Payer: Self-pay | Admitting: Emergency Medicine

## 2023-01-21 ENCOUNTER — Emergency Department (HOSPITAL_BASED_OUTPATIENT_CLINIC_OR_DEPARTMENT_OTHER): Payer: PPO

## 2023-01-21 DIAGNOSIS — Z87891 Personal history of nicotine dependence: Secondary | ICD-10-CM | POA: Insufficient documentation

## 2023-01-21 DIAGNOSIS — S8261XA Displaced fracture of lateral malleolus of right fibula, initial encounter for closed fracture: Secondary | ICD-10-CM | POA: Diagnosis not present

## 2023-01-21 DIAGNOSIS — W01198A Fall on same level from slipping, tripping and stumbling with subsequent striking against other object, initial encounter: Secondary | ICD-10-CM | POA: Insufficient documentation

## 2023-01-21 DIAGNOSIS — M7731 Calcaneal spur, right foot: Secondary | ICD-10-CM | POA: Diagnosis not present

## 2023-01-21 DIAGNOSIS — Z79899 Other long term (current) drug therapy: Secondary | ICD-10-CM | POA: Diagnosis not present

## 2023-01-21 DIAGNOSIS — S82831A Other fracture of upper and lower end of right fibula, initial encounter for closed fracture: Secondary | ICD-10-CM | POA: Insufficient documentation

## 2023-01-21 DIAGNOSIS — I10 Essential (primary) hypertension: Secondary | ICD-10-CM | POA: Diagnosis not present

## 2023-01-21 DIAGNOSIS — Z7982 Long term (current) use of aspirin: Secondary | ICD-10-CM | POA: Diagnosis not present

## 2023-01-21 DIAGNOSIS — S82421A Displaced transverse fracture of shaft of right fibula, initial encounter for closed fracture: Secondary | ICD-10-CM | POA: Diagnosis not present

## 2023-01-21 DIAGNOSIS — S99911A Unspecified injury of right ankle, initial encounter: Secondary | ICD-10-CM | POA: Diagnosis present

## 2023-01-21 MED ORDER — OXYCODONE-ACETAMINOPHEN 5-325 MG PO TABS
1.0000 | ORAL_TABLET | Freq: Once | ORAL | Status: AC
  Administered 2023-01-21: 1 via ORAL
  Filled 2023-01-21: qty 1

## 2023-01-21 NOTE — ED Triage Notes (Signed)
Pt was sitting on the bed and foot fell asleep last night. When she got up, her "foot folded" and hit her head on the dresser. 81 mg Aspirin daily. She is still having pain and difficulty putting any pressure on R ankle.   Cardiac hx, wears 2L O2 at home. Does not have portable tank.

## 2023-01-21 NOTE — ED Notes (Signed)
Pt given discharge instructions. Opportunities given for questions. Pt verbalizes understanding. , R, RN 

## 2023-01-21 NOTE — Discharge Instructions (Addendum)
We evaluated you for your ankle pain.  Your x-ray shows a very small fracture to your fibula, bone in your ankle.  It is safe to walk with this type of injury.  We have given you a cam boot to help stabilize your ankle.  Please follow-up with the orthopedic doctor in around 1 week.  Please elevate your leg to help with swelling.  You can also apply ice.  Please take 1000 mg of Tylenol every 6 hours as needed for pain. Please return for any new symptoms such as severe pain, tingling, chills, severe headaches, vomiting, confusion, or any other concerning symptoms.

## 2023-01-21 NOTE — ED Provider Notes (Signed)
[place] EMERGENCY DEPARTMENT AT [facility] Provider Note  CSN: [id number] Arrival date & time: 01/21/23 4098  Chief Complaint(s) Ankle Pain  HPI [name] [name] is a 67 y.o. female with history of interstitial lung disease, on chronic home oxygen presenting to the emergency department with right ankle pain.  Patient reports that yesterday night she got up after her foot fell asleep, lost her balance and then fell.  Subsequently developed pain in the right ankle.  She reports she grazed her head on the dresser but did not directly strike it.  No loss of consciousness, vomiting, headache or dizziness.  No other injuries, no neck or back pain, upper extremity pain, left lower extremity pain.   Past Medical History Past Medical History:  Diagnosis Date   Allergic rhinitis 01/23/2008   Angina pectoris 06/06/2016   Arthritis    Attention deficit hyperactivity disorder (ADHD) 02/07/2016   Back pain, lumbar 01/21/2009   Bell's palsy 02/06/2008   Bipolar disorder 12/13/2009   Chronic idiopathic constipation    Dizziness 04/16/2008   Edema, leg 01/21/2009   Epigastric pain    Essential hypertension 01/23/2008   Gastro-esophageal reflux disease without esophagitis 01/23/2008   Generalized anxiety disorder 01/23/2008   Headache 04/15/2007   Hidradenitis 10/04/2008   History of colonic polyps    Hyperlipidemia    Hypokalemia 07/23/2018   Insomnia 01/23/2008   Major depressive disorder 01/23/2008   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   Neck pain 05/06/2017   Obesity (BMI 30-39.9) 11/10/2015   OSA (obstructive sleep apnea) 04/15/2007   inspire implant; does not always remember to turn device on   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/2008   Qualifier: Diagnosis of  By: Clent Ridges MD, Tera Mater    PUD (peptic ulcer disease) 06/06/2016   Unspecified urinary incontinence 01/23/2008   Wears dentures    Wears glasses    Patient Active Problem List   Diagnosis Date Noted   Positive ANA (antinuclear  antibody) 01/11/2023   Chronic respiratory failure with hypoxia (HCC) 09/25/2022   Aorto-iliac atherosclerosis (HCC) 07/05/2022   Normocytic anemia 07/05/2022   Precordial chest pain 07/04/2022   Centrilobular emphysema (HCC) 06/26/2022   Chronic midline thoracic back pain 04/03/2022   Pulmonary nodule 1 cm or greater in diameter 01/18/2022   Bleeding external hemorrhoids 12/07/2021   ILD (interstitial lung disease) (HCC) 12/07/2021   Chronic idiopathic constipation    Epigastric pain    History of colonic polyps    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   Hypokalemia 07/23/2018   Neck pain 05/06/2017   Angina pectoris 06/06/2016   PUD (peptic ulcer disease) 06/06/2016   Attention deficit hyperactivity disorder (ADHD) 02/07/2016   Obesity (BMI 30-39.9) 11/10/2015   Bipolar disorder 12/13/2009   Back pain, lumbar 01/21/2009   Edema, leg 01/21/2009   Hidradenitis 10/04/2008   Dizziness 04/16/2008   Bell's palsy 02/06/2008   Generalized anxiety disorder 01/23/2008   Major depressive disorder 01/23/2008   Allergic rhinitis 01/23/2008   Gastro-esophageal reflux disease without esophagitis 01/23/2008   Insomnia 01/23/2008   Unspecified urinary incontinence 01/23/2008   Hyperlipidemia 04/15/2007   OSA (obstructive sleep apnea) 04/15/2007   Headache 04/15/2007   Home Medication(s) Prior to Admission medications   Medication Sig Start Date End Date Taking? Authorizing Provider  acyclovir (ZOVIRAX) 400 MG tablet TAKE 1 TABLET BY MOUTH 4 TIMES A DAY AS NEEDED Patient taking differently: Take 800 mg by mouth 2 (two) times daily as needed (for sores in nose). 03/20/22   Nelwyn Salisbury, MD  albuterol (PROVENTIL) (2.5 MG/3ML) 0.083% nebulizer solution Take 3 mLs (2.5 mg total) by nebulization every 6 (six) hours as needed for wheezing or shortness of breath. 06/25/22   [name], MD  albuterol (VENTOLIN HFA) 108 (90 Base) MCG/ACT inhaler Inhale 1-2 puffs into the lungs every 6 (six) hours as  needed. 12/26/22   Parrett, Virgel Bouquet, NP  alprazolam Prudy Feeler) 2 MG tablet TAKE 1 TABLET BY MOUTH 3 TIMES DAILY AS NEEDED FOR ANXIETY. 10/23/22   Nelwyn Salisbury, MD  Ascorbic Acid (VITAMIN C PO) Take by mouth.    [provider]  aspirin EC 81 MG tablet Take 81 mg by mouth daily. Swallow whole.    [provider]  B Complex-C-Folic Acid (B-COMPLEX/VITAMIN C PO) Take 1 tablet by mouth daily.    [provider]  Cholecalciferol (VITAMIN D3) 125 MCG (5000 UT) CAPS Take 5,000 Units by mouth once a week.    [provider]  FLUoxetine (PROZAC) 40 MG capsule TAKE 1 CAPSULE (40 MG TOTAL) BY MOUTH DAILY. 06/12/22   Nelwyn Salisbury, MD  Fluticasone-Umeclidin-Vilant (TRELEGY ELLIPTA) 100-62.5-25 MCG/ACT AEPB Inhale 1 puff into the lungs daily. 07/10/22   [name], MD  hydrocortisone 2.5 % cream PLACE 1 APPLICATION RECTALLY 2 TIMES DAILY.    [provider]  Magnesium 500 MG CAPS Take 500 mg by mouth daily.    [provider]  MAGNESIUM PO Take by mouth.    [provider]  Multiple Vitamins-Minerals (MULTIVITAMIN WITH MINERALS) tablet Take 1 tablet by mouth daily.    [provider]  omeprazole (PRILOSEC) 20 MG capsule TAKE 1 CAPSULE BY MOUTH 2 TIMES DAILY BEFORE A MEAL. Patient taking differently: Take 20 mg by mouth at bedtime. 10/09/21   Nelwyn Salisbury, MD  oxyCODONE (OXYCONTIN) 10 mg 12 hr tablet Take 10 mg by mouth every 12 (twelve) hours.    [provider]  polyethylene glycol (MIRALAX / GLYCOLAX) 17 g packet Use one packet daily Patient taking differently: Take 17 g by mouth 2 (two) times daily as needed for moderate constipation. 10/10/21   Nelwyn Salisbury, MD  potassium chloride (KLOR-CON) 10 MEQ tablet Take 1 tablet (10 mEq total) by mouth daily. 07/02/22   Nelwyn Salisbury, MD  rosuvastatin (CRESTOR) 40 MG tablet TAKE 1 TABLET (40 MG TOTAL) BY MOUTH DAILY. 07/02/22   Nelwyn Salisbury, MD  triamterene-hydrochlorothiazide  (DYAZIDE) 37.5-25 MG capsule Take 1 capsule by mouth every morning. 07/02/22   [provider]                                                                                                                                    Past Surgical History Past Surgical History:  Procedure Laterality Date   ABDOMINAL HYSTERECTOMY     ANTERIOR FUSION CERVICAL SPINE  2006   Dr. Wynetta Emery   BREAST BIOPSY Right 1981  benign   BREAST EXCISIONAL BIOPSY Right 1981   benign   scar does not show   BREAST SURGERY Bilateral 12/11/2012   reductions per Dr. Ivin Booty in [facility]    CATARACT EXTRACTION, BILATERAL Bilateral last summer of 2018   at [facility]   COLONOSCOPY  03/07/2021   per Dr. Loreta Ave, adenomatous polyps, repeat in 7 yrs   DRUG INDUCED ENDOSCOPY N/A 01/30/2019   Procedure: DRUG INDUCED ENDOSCOPY;  Surgeon: Christia Reading, MD;  Location: [facility];  Service: ENT;  Laterality: N/A;   ESOPHAGOGASTRODUODENOSCOPY  04/23/2011   per Dr. Loreta Ave, normal    EYE SURGERY  06/18/2016   L eyelid    IMPLANTATION OF HYPOGLOSSAL NERVE STIMULATOR Right 03/25/2019   Procedure: IMPLANTATION OF HYPOGLOSSAL NERVE STIMULATOR;  Surgeon: Christia Reading, MD;  Location: [facility] OR;  Service: ENT;  Laterality: Right;   LEFT HEART CATH AND CORONARY ANGIOGRAPHY N/A 07/05/2022   Procedure: LEFT HEART CATH AND CORONARY ANGIOGRAPHY;  Surgeon: Swazil[place], Peter M, MD;  Location: [facility] INVASIVE CV LAB;  Service: Cardiovascular;  Laterality: N/A;   LUMBAR DISC SURGERY  2003   Dr. Wynetta Emery   MULTIPLE TOOTH EXTRACTIONS     ORIF WRIST FRACTURE Right 08/29/2018   REDUCTION MAMMAPLASTY Bilateral 2013   SPLIT NIGHT STUDY  11/11/2015   UVULOPALATOPHARYNGOPLASTY     VAGINAL HYSTERECTOMY  05/1995   secondary to fibroids.   Family History Family History  Problem Relation Age of Onset   Dementia Mother    Hyperlipidemia Mother    Asthma Father    Hypertension Sister    Hyperlipidemia Sister    Coronary artery disease  Sister    Thyroid disease Sister    Hypertension Sister    Hyperlipidemia Sister    Hypertension Brother    Hyperlipidemia Brother    Coronary artery disease Brother    Alcohol abuse Other        fhx   Arthritis Other        fhx   Coronary artery disease Other        fhx   Depression Other        fhx   Diabetes Other        fhx   Hyperlipidemia Other        fhx   Hypertension Other        fhx   Sudden death Other        fhx    Social History Social History   Tobacco Use   Smoking status: Former    Current packs/day: 0.00    Types: Cigarettes    Start date: 12/1990    Quit date: 12/2020    Years since quitting: 2.0    Passive exposure: Past (occasional)   Smokeless tobacco: Never  Vaping Use   Vaping status: Former  Substance Use Topics   Alcohol use: Not Currently   Drug use: No   Allergies Sulfonamide derivatives, Flexeril [cyclobenzaprine], Hydromorphone, Morphine and codeine, and Sulfamethoxazole  Review of Systems Review of Systems  All other systems reviewed and are negative.   Physical Exam Vital Signs  I have reviewed the triage vital signs BP 119/86   Pulse 84   Temp 98.7 F (37.1 C) (Oral)   Resp 19   Ht 5\' 3"  (1.6 m)   Wt 86.2 kg   SpO2 94%   BMI 33.66 kg/m  Physical Exam Vitals and nursing note reviewed.  Constitutional:      General: She is not in  acute distress.    Appearance: She is well-developed.  HENT:     Head: Normocephalic and atraumatic.     Mouth/Throat:     Mouth: Mucous membranes are moist.  Eyes:     Pupils: Pupils are equal, round, and reactive to light.  Cardiovascular:     Rate and Rhythm: Normal rate and regular rhythm.     Heart sounds: No murmur heard. Pulmonary:     Effort: Pulmonary effort is normal. No respiratory distress.     Breath sounds: Normal breath sounds.  Abdominal:     General: Abdomen is flat.     Palpations: Abdomen is soft.     Tenderness: There is no abdominal tenderness.   Musculoskeletal:        General: No tenderness.     Right lower leg: No edema.     Left lower leg: No edema.     Comments: No midline C, T, L-spine tenderness.  No chest wall tenderness or crepitus.  Full active range of motion in the bilateral upper and left lower extremity.  Mild tenderness to palpation over the right anterior lateral ankle and lateral right foot.  No tenderness to the base of the foot.  Mild soft tissue swelling over the anterior ankle with no deformity.  No tenderness to the malleoli.  Skin:    General: Skin is warm and dry.  Neurological:     General: No focal deficit present.     Mental Status: She is alert and oriented to person, place, and time. Mental status is at baseline.  Psychiatric:        Mood and Affect: Mood normal.        Behavior: Behavior normal.     ED Results and Treatments Labs (all labs ordered are listed, but only abnormal results are displayed) Labs Reviewed - No data to display                                                                                                                        Radiology DG Foot Complete Right  Result Date: 01/21/2023 CLINICAL DATA:  Foot pain. Patient was sitting on the bed and foot fell asleep last night. EXAM: RIGHT FOOT COMPLETE - 3+ VIEW COMPARISON:  Right ankle radiographs 01/21/2023, right foot radiographs 11/07/2019 FINDINGS: Redemonstration of acute transverse fracture of the distal aspect of the fibula with approximately 1 mm diastasis, as seen on dedicated right ankle radiographs today. Moderate lateral ankle soft tissue swelling. Mild-to-moderate soft tissue swelling medial to the navicular. Tiny plantar calcaneal heel spur. No additional acute fracture is seen within the right foot. IMPRESSION: 1. Redemonstration of acute transverse fracture of the distal aspect of the fibula with approximately 1 mm diastasis, as seen on dedicated right ankle radiographs today. 2. No additional acute fracture is seen  within the right foot. Electronically Signed   By: Neita Garnet M.D.   On: 01/21/2023 08:58   DG Ankle Right Port  Result Date: 01/21/2023  CLINICAL DATA:  [id number] Fall [id number] EXAM: PORTABLE RIGHT ANKLE - 2 VIEW COMPARISON:  None Available. FINDINGS: Transverse fracture of the inferior aspect lateral malleolus, distracted approximately 1 mm. Overlying soft tissue swelling. Ankle mortise intact. Small calcaneal spur. IMPRESSION: Minimally distracted lateral malleolar fracture. Electronically Signed   By: Corlis Leak M.D.   On: 01/21/2023 08:07    Pertinent labs & imaging results that were available during my care of the patient were reviewed by me and considered in my medical decision making (see MDM for details).  Medications Ordered in ED Medications  oxyCODONE-acetaminophen (PERCOCET/ROXICET) 5-325 MG per tablet 1 tablet (1 tablet Oral Given 01/21/23 0820)                                                                                                                                     Procedures Procedures  (including critical care time)  Medical Decision Making / ED Course   MDM:  67 year old female presenting to the emergency department after twisting ankle.  Patient overall well-appearing, has some tenderness to the anterior right ankle.  Will obtain x-ray.  She has some tenderness to the foot so obtain x-ray of the foot.  She reports that she grazed her head on a dresser, overall very low concern for intracranial process, head atraumatic, offered CT head but they refused which I think is reasonable.  Will reassess.  Clinical Course as of 01/21/23 2536  [facility] Jan 21, 2023  6440 Patient has small distal fibula fracture. Mortise intact. Doubt maissoneuve fracture, no proximal ttp. No foot fracture. Placed in CAM boot. Will discharge patient to home. All questions answered. Patient comfortable with plan of discharge. Return precautions discussed with patient and specified on the after visit  summary.  [WS]    Clinical Course User Index [WS] Lonell Grandchild, MD     Additional history obtained: -Additional history obtained from spouse     Imaging Studies ordered: I ordered imaging studies including ankle xray On my interpretation imaging demonstrates distal fibula fracture I independently visualized and interpreted imaging. I agree with the radiologist interpretation   Medicines ordered and prescription drug management: Meds ordered this encounter  Medications   oxyCODONE-acetaminophen (PERCOCET/ROXICET) 5-325 MG per tablet 1 tablet    -I have reviewed the patients home medicines and have made adjustments as needed    Cardiac Monitoring: The patient was maintained on a cardiac monitor.  I personally viewed and interpreted the cardiac monitored which showed an underlying rhythm of: NSR  Social Determinants of Health:  Diagnosis or treatment significantly limited by social determinants of health: obesity   Reevaluation: After the interventions noted above, I reevaluated the patient and found that their symptoms have improved  Co morbidities that complicate the patient evaluation  Past Medical History:  Diagnosis Date   Allergic rhinitis 01/23/2008   Angina pectoris 06/06/2016   Arthritis    Attention deficit hyperactivity disorder (ADHD)  02/07/2016   Back pain, lumbar 01/21/2009   Bell's palsy 02/06/2008   Bipolar disorder 12/13/2009   Chronic idiopathic constipation    Dizziness 04/16/2008   Edema, leg 01/21/2009   Epigastric pain    Essential hypertension 01/23/2008   Gastro-esophageal reflux disease without esophagitis 01/23/2008   Generalized anxiety disorder 01/23/2008   Headache 04/15/2007   Hidradenitis 10/04/2008   History of colonic polyps    Hyperlipidemia    Hypokalemia 07/23/2018   Insomnia 01/23/2008   Major depressive disorder 01/23/2008   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    Final Clinical Impression(s) / ED Diagnoses Final diagnoses:  Closed fracture of distal end of right fibula, unspecified fracture morphology, initial encounter     This chart was dictated using voice recognition software.  Despite best efforts to proofread,  errors can occur which can change the documentation meaning.    Lonell Grandchild, MD 01/21/23 [phone]

## 2023-01-22 ENCOUNTER — Telehealth (HOSPITAL_BASED_OUTPATIENT_CLINIC_OR_DEPARTMENT_OTHER): Payer: Self-pay | Admitting: Family

## 2023-01-22 ENCOUNTER — Telehealth (INDEPENDENT_AMBULATORY_CARE_PROVIDER_SITE_OTHER): Payer: PPO | Admitting: Family

## 2023-01-22 ENCOUNTER — Encounter (HOSPITAL_BASED_OUTPATIENT_CLINIC_OR_DEPARTMENT_OTHER): Payer: Self-pay | Admitting: Family

## 2023-01-22 VITALS — BP 122/76 | HR 87 | Ht 63.0 in | Wt 187.0 lb

## 2023-01-22 DIAGNOSIS — I10 Essential (primary) hypertension: Secondary | ICD-10-CM | POA: Diagnosis not present

## 2023-01-22 DIAGNOSIS — R5383 Other fatigue: Secondary | ICD-10-CM

## 2023-01-22 DIAGNOSIS — I25118 Atherosclerotic heart disease of native coronary artery with other forms of angina pectoris: Secondary | ICD-10-CM

## 2023-01-22 DIAGNOSIS — R600 Localized edema: Secondary | ICD-10-CM | POA: Diagnosis not present

## 2023-01-22 DIAGNOSIS — E785 Hyperlipidemia, unspecified: Secondary | ICD-10-CM

## 2023-01-22 DIAGNOSIS — G4733 Obstructive sleep apnea (adult) (pediatric): Secondary | ICD-10-CM

## 2023-01-22 DIAGNOSIS — I2510 Atherosclerotic heart disease of native coronary artery without angina pectoris: Secondary | ICD-10-CM | POA: Diagnosis not present

## 2023-01-22 NOTE — Telephone Encounter (Signed)
Returned call to patient,   They are able to check blood pressure at home! Switching to mychart video visit!

## 2023-01-22 NOTE — Progress Notes (Unsigned)
Virtual Visit via Video Note   Because of [name] [name]'s co-morbid illnesses, she is at least at moderate risk for complications without adequate follow up.  This format is felt to be most appropriate for this patient at this time.  All issues noted in this document were discussed and addressed.  A limited physical exam was performed with this format.  Please refer to the patient's chart for her consent to telehealth for [facility].     Date:  01/22/2023   ID:  [name] [name], DOB [birth year], MRN [id number] The patient was identified using 2 identifiers.  Patient Location: Home Provider Location: Office/Clinic   PCP:  Nelwyn Salisbury, MD   [facility] Providers Cardiologist:  Armanda Magic, MD { Click to update primary MD,subspecialty MD or APP then REFRESH:1}    Evaluation Performed:  Follow-Up Visit  Chief Complaint:  ***  History of Present Illness:    [name] [name] is a 67 y.o. female with ***  Eats predominantly at home. ***  Feels like pins and needles Sittin on her bed [place] style on her bed, got up and foot was asleep, fell.  Does have difficulty with balance.   Had mamogram last week with which her Inspire device caused more musculoskeletal pain.   Still struggling with sleep -   Sonw ith myriad of concerns  Has gained some weight - feels her heart is requiring more oxygen - feels lungs deteriorating and not providing enough oxygen to the heart. Feeling very lethargic. SOn is worried about her lethargy.   In ED visit - son averaged her 7  BP readings 116/89 -   Bilateral edema  Echo 06/2022 LVEF 60-65%, normal diastolic function, no significant valvular abnormalities.    Concerned as she is sleeping a lot. 2 years did not get out of bed - lost her brother, brother in law, 3 SIL   Past Medical History:  Diagnosis Date   Allergic rhinitis 01/23/2008   Angina pectoris 06/06/2016   Arthritis    Attention deficit  hyperactivity disorder (ADHD) 02/07/2016   Back pain, lumbar 01/21/2009   Bell's palsy 02/06/2008   Bipolar disorder 12/13/2009   Chronic idiopathic constipation    Dizziness 04/16/2008   Edema, leg 01/21/2009   Epigastric pain    Essential hypertension 01/23/2008   Gastro-esophageal reflux disease without esophagitis 01/23/2008   Generalized anxiety disorder 01/23/2008   Headache 04/15/2007   Hidradenitis 10/04/2008   History of colonic polyps    Hyperlipidemia    Hypokalemia 07/23/2018   Insomnia 01/23/2008   Major depressive disorder 01/23/2008   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   Neck pain 05/06/2017   Obesity (BMI 30-39.9) 11/10/2015   OSA (obstructive sleep apnea) 04/15/2007   inspire implant; does not always remember to turn device on   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 Wynetta Emery   BREAST BIOPSY Right 1981   benign   BREAST EXCISIONAL BIOPSY Right 1981   benign   scar does not show   BREAST SURGERY Bilateral 12/11/2012   reductions per Dr. Ivin Booty in [facility]    CATARACT EXTRACTION, BILATERAL Bilateral last summer of  2018   at [facility]   COLONOSCOPY  03/07/2021   per Dr. Loreta Ave, adenomatous polyps, repeat in 7 yrs   DRUG INDUCED ENDOSCOPY N/A 01/30/2019   Procedure: DRUG INDUCED ENDOSCOPY;  Surgeon: Christia Reading, MD;  Location: [facility];  Service: ENT;  Laterality: N/A;   ESOPHAGOGASTRODUODENOSCOPY  04/23/2011   per Dr. Loreta Ave, normal    EYE SURGERY  06/18/2016   L eyelid    IMPLANTATION OF HYPOGLOSSAL NERVE STIMULATOR Right 03/25/2019   Procedure: IMPLANTATION OF HYPOGLOSSAL NERVE STIMULATOR;  Surgeon: Christia Reading, MD;  Location: [facility];  Service: ENT;   Laterality: Right;   LEFT HEART CATH AND CORONARY ANGIOGRAPHY N/A 07/05/2022   Procedure: LEFT HEART CATH AND CORONARY ANGIOGRAPHY;  Surgeon: Swaziland, Peter M, MD;  Location: [facility] INVASIVE CV LAB;  Service: Cardiovascular;  Laterality: N/A;   LUMBAR DISC SURGERY  2003   Dr. Wynetta Emery   MULTIPLE TOOTH EXTRACTIONS     ORIF WRIST FRACTURE Right 08/29/2018   REDUCTION MAMMAPLASTY Bilateral 2013   SPLIT NIGHT STUDY  11/11/2015   UVULOPALATOPHARYNGOPLASTY     VAGINAL HYSTERECTOMY  05/1995   secondary to fibroids.     Current Meds  Medication Sig   acyclovir (ZOVIRAX) 400 MG tablet TAKE 1 TABLET BY MOUTH 4 TIMES A DAY AS NEEDED (Patient taking differently: Take 800 mg by mouth 2 (two) times daily as needed (for sores in nose).)   albuterol (PROVENTIL) (2.5 MG/3ML) 0.083% nebulizer solution Take 3 mLs (2.5 mg total) by nebulization every 6 (six) hours as needed for wheezing or shortness of breath.   albuterol (VENTOLIN HFA) 108 (90 Base) MCG/ACT inhaler Inhale 1-2 puffs into the lungs every 6 (six) hours as needed.   alprazolam (XANAX) 2 MG tablet TAKE 1 TABLET BY MOUTH 3 TIMES DAILY AS NEEDED FOR ANXIETY.   Ascorbic Acid (VITAMIN C PO) Take by mouth.   aspirin EC 81 MG tablet Take 81 mg by mouth daily. Swallow whole.   B Complex-C-Folic Acid (B-COMPLEX/VITAMIN C PO) Take 1 tablet by mouth daily.   Cholecalciferol (VITAMIN D3) 125 MCG (5000 UT) CAPS Take 5,000 Units by mouth once a week.   FLUoxetine (PROZAC) 40 MG capsule TAKE 1 CAPSULE (40 MG TOTAL) BY MOUTH DAILY.   Fluticasone-Umeclidin-Vilant (TRELEGY ELLIPTA) 100-62.5-25 MCG/ACT AEPB Inhale 1 puff into the lungs daily.   hydrocortisone 2.5 % cream PLACE 1 APPLICATION RECTALLY 2 TIMES DAILY.   Magnesium 500 MG CAPS Take 500 mg by mouth daily.   MAGNESIUM PO Take by mouth.   Multiple Vitamins-Minerals (MULTIVITAMIN WITH MINERALS) tablet Take 1 tablet by mouth daily.   omeprazole (PRILOSEC) 20 MG capsule TAKE 1 CAPSULE BY MOUTH 2 TIMES DAILY BEFORE A  MEAL.   oxyCODONE (OXYCONTIN) 10 mg 12 hr tablet Take 10 mg by mouth every 12 (twelve) hours.   polyethylene glycol (MIRALAX / GLYCOLAX) 17 g packet Use one packet daily (Patient taking differently: Take 17 g by mouth 2 (two) times daily as needed for moderate constipation.)   potassium chloride (KLOR-CON) 10 MEQ tablet TAKE 1 TABLET (10 MEQ TOTAL) BY MOUTH DAILY.   rosuvastatin (CRESTOR) 40 MG tablet TAKE 1 TABLET (40 MG TOTAL) BY MOUTH DAILY.   triamterene-hydrochlorothiazide (DYAZIDE) 37.5-25 MG capsule Take 1 capsule by mouth every morning.     Allergies:   Sulfonamide derivatives, Flexeril [cyclobenzaprine], Hydromorphone, Morphine and codeine, and Sulfamethoxazole   Social History   Tobacco Use   Smoking status: Former    Current packs/day: 0.00  Types: Cigarettes    Start date: 12/1990    Quit date: 12/2020    Years since quitting: 2.1    Passive exposure: Past (occasional)   Smokeless tobacco: Never  Vaping Use   Vaping status: Former  Substance Use Topics   Alcohol use: Not Currently   Drug use: No     Family Hx: The patient's family history includes Alcohol abuse in an other family member; Arthritis in an other family member; Asthma in her father; Coronary artery disease in her brother, sister, and another family member; Dementia in her mother; Depression in an other family member; Diabetes in an other family member; Hyperlipidemia in her brother, mother, sister, sister, and another family member; Hypertension in her brother, sister, sister, and another family member; Sudden death in an other family member; Thyroid disease in her sister.  ROS:   Please see the history of present illness.     All other systems reviewed and are negative.   Prior CV studies:   The following studies were reviewed today:  Cardiac Studies & Procedures   CARDIAC CATHETERIZATION  CARDIAC CATHETERIZATION 07/05/2022  Narrative   Prox LAD to Mid LAD lesion is 20% stenosed.   Prox RCA  lesion is 40% stenosed.   LV end diastolic pressure is mildly elevated.  Nonobstructive CAD Mildly elevated LVEDP 25 mm Hg  Plan: risk factor modification. Anticipate same day DC.  Findings Coronary Findings Diagnostic  Dominance: Right  Left Main Vessel was injected. Vessel is normal in caliber. Vessel is angiographically normal.  Left Anterior Descending Prox LAD to Mid LAD lesion is 20% stenosed. The lesion is severely calcified.  Left Circumflex Vessel was injected. Vessel is normal in caliber. Vessel is angiographically normal.  Right Coronary Artery Prox RCA lesion is 40% stenosed. The lesion is calcified.  Intervention  No interventions have been documented.     ECHOCARDIOGRAM  ECHOCARDIOGRAM COMPLETE 07/04/2022  Narrative ECHOCARDIOGRAM REPORT    Patient Name:   [name] [name] Date of Exam: 07/04/2022 Medical Rec #:  [id number]          Height:       62.0 in Accession #:    [id number]         Weight:       187.8 lb Date of Birth:  [birth year]         BSA:          1.861 m Patient Age:    66 years           BP:           117/64 mmHg Patient Gender: F                  HR:           70 bpm. Exam Location:  Inpatient  Procedure: 2D Echo  Indications:    chest pain  History:        Patient has no prior history of Echocardiogram examinations. Signs/Symptoms:Edema; Risk Factors:Sleep Apnea, Hypertension and Dyslipidemia.  Sonographer:    Delcie Roch RDCS Referring Phys: [id number] Perlie Gold  IMPRESSIONS   1. Left ventricular ejection fraction, by estimation, is 60 to 65%. The left ventricle has normal function. The left ventricle has no regional wall motion abnormalities. Left ventricular diastolic parameters were normal. 2. Right ventricular systolic function is normal. The right ventricular size is normal. 3. The mitral valve is normal in structure. No evidence of mitral valve regurgitation. No evidence of mitral stenosis.  4. The aortic valve  has an indeterminant number of cusps. Aortic valve regurgitation is not visualized. No aortic stenosis is present. 5. The inferior vena cava is normal in size with greater than 50% respiratory variability, suggesting right atrial pressure of 3 mmHg.  FINDINGS Left Ventricle: Left ventricular ejection fraction, by estimation, is 60 to 65%. The left ventricle has normal function. The left ventricle has no regional wall motion abnormalities. The left ventricular internal cavity size was normal in size. There is no left ventricular hypertrophy. Left ventricular diastolic parameters were normal.  Right Ventricle: The right ventricular size is normal. Right ventricular systolic function is normal.  Left Atrium: Left atrial size was normal in size.  Right Atrium: Right atrial size was normal in size.  Pericardium: There is no evidence of pericardial effusion.  Mitral Valve: The mitral valve is normal in structure. No evidence of mitral valve regurgitation. No evidence of mitral valve stenosis.  Tricuspid Valve: The tricuspid valve is normal in structure. Tricuspid valve regurgitation is not demonstrated. No evidence of tricuspid stenosis.  Aortic Valve: The aortic valve has an indeterminant number of cusps. Aortic valve regurgitation is not visualized. No aortic stenosis is present.  Pulmonic Valve: The pulmonic valve was normal in structure. Pulmonic valve regurgitation is not visualized. No evidence of pulmonic stenosis.  Aorta: The aortic root is normal in size and structure.  Venous: The inferior vena cava is normal in size with greater than 50% respiratory variability, suggesting right atrial pressure of 3 mmHg.  IAS/Shunts: No atrial level shunt detected by color flow Doppler.   LEFT VENTRICLE PLAX 2D LVIDd:         3.60 cm   Diastology LVIDs:         2.40 cm   LV e' medial:    8.16 cm/s LV PW:         1.00 cm   LV E/e' medial:  8.8 LV IVS:        0.90 cm   LV e' lateral:   8.59  cm/s LVOT diam:     1.80 cm   LV E/e' lateral: 8.4 LV SV:         61 LV SV Index:   33 LVOT Area:     2.54 cm   RIGHT VENTRICLE            IVC RV Basal diam:  2.70 cm    IVC diam: 1.10 cm RV S prime:     9.68 cm/s TAPSE (M-mode): 1.8 cm  LEFT ATRIUM             Index        RIGHT ATRIUM           Index LA diam:        2.70 cm 1.45 cm/m   RA Area:     11.00 cm LA Vol (A2C):   29.1 ml 15.63 ml/m  RA Volume:   22.70 ml  12.20 ml/m LA Vol (A4C):   20.4 ml 10.96 ml/m LA Biplane Vol: 24.9 ml 13.38 ml/m AORTIC VALVE LVOT Vmax:   125.00 cm/s LVOT Vmean:  75.300 cm/s LVOT VTI:    0.238 m  AORTA Ao Root diam: 2.60 cm Ao Asc diam:  3.10 cm  MITRAL VALVE MV Area (PHT): 3.21 cm    SHUNTS MV Decel Time: 236 msec    Systemic VTI:  0.24 m MV E velocity: 72.20 cm/s  Systemic Diam: 1.80 cm MV A velocity: 89.50 cm/s  MV E/A ratio:  0.81  Olga Millers MD Electronically signed by Olga Millers MD Signature Date/Time: 07/04/2022/5:25:09 PM    Final              Labs/Other Tests and Data Reviewed:    EKG:  No ECG reviewed.  Recent Labs: 07/04/2022: ALT 22; B Natriuretic Peptide 5.4; BUN 24; Creatinine, Ser 0.75; Potassium 3.6; Sodium 138 07/05/2022: Hemoglobin 10.8; Platelets 145   Recent Lipid Panel Lab Results  Component Value Date/Time   CHOL 145 07/05/2022 04:46 AM   TRIG 70 07/05/2022 04:46 AM   HDL 63 07/05/2022 04:46 AM   CHOLHDL 2.3 07/05/2022 04:46 AM   LDLCALC 68 07/05/2022 04:46 AM   LDLCALC 124 (H) 03/17/2020 11:52 AM   LDLDIRECT 129.0 12/07/2021 11:14 AM    Wt Readings from Last 3 Encounters:  01/22/23 187 lb (84.8 kg)  01/21/23 190 lb (86.2 kg)  01/11/23 196 lb (88.9 kg)     Risk Assessment/Calculations:      STOP-Bang Score:  3  { Consider Dx Sleep Disordered Breathing or Sleep Apnea  ICD G47.33          :1}    Objective:    Vital Signs:  BP 122/76   Pulse 87   Ht 5\' 3"  (1.6 m)   Wt 187 lb (84.8 kg)   SpO2 94%   BMI 33.13 kg/m     VITAL SIGNS:  reviewed GEN:  no acute distress RESPIRATORY:  normal respiratory effort, symmetric expansion CARDIOVASCULAR:  no peripheral edema MUSCULOSKELETAL:  no obvious deformities.  ASSESSMENT & PLAN:     OSA - s/p hypoglossal nerve stimulator. Follows with Dr. Mayford Knife.   HTN - BP well controlled. Not requiring antihypertensive agent. Discussed to monitor BP at home at least 2 hours after medications and sitting for 5-10 minutes.   Nonobstructive CAD / HLD, LDL goal <70 - 06/2022 LDL 68. GDMT aspirin, Rosuvastatin. Heart healthy diet and regular cardiovascular exercise encouraged.        {Are you ordering a CV Procedure (e.g. stress test, cath, DCCV, TEE, etc)?   Press F2        :[id number]}     Time:   Today, I have spent *** minutes with the patient with telehealth technology discussing the above problems.     Medication Adjustments/Labs and Tests Ordered: Current medicines are reviewed at length with the patient today.  Concerns regarding medicines are outlined above.   Tests Ordered: No orders of the defined types were placed in this encounter.   Medication Changes: No orders of the defined types were placed in this encounter.   Follow Up:  In Person {follow up:15908}  Signed, Alver Sorrow, NP  01/22/2023 3:22 PM    [facility]

## 2023-01-22 NOTE — Patient Instructions (Addendum)
Medication Instructions:  Continue your current medications.   *If you need a refill on your cardiac medications before your next appointment, please call your pharmacy*   Lab Work:  Have labs done tomorrow at the [facility] (where you usually see Dr. Mayford Knife) for CMP, BNP, CBC, thyroid panel.   If you have labs (blood work) drawn today and your tests are completely normal, you will receive your results only by: MyChart Message (if you have MyChart) OR A paper copy in the mail If you have any lab test that is abnormal or we need to change your treatment, we will call you to review the results.  Follow-Up: At [facility], you and your health needs are our priority.  As part of our continuing mission to provide you with exceptional heart care, we have created designated Provider Care Teams.  These Care Teams include your primary Cardiologist (physician) and Advanced Practice Providers (APPs -  Physician Assistants and Nurse Practitioners) who all work together to provide you with the care you need, when you need it.  We recommend signing up for the patient portal called "MyChart".  Sign up information is provided on this After Visit Summary.  MyChart is used to connect with patients for Virtual Visits (Telemedicine).  Patients are able to view lab/test results, encounter notes, upcoming appointments, etc.  Non-urgent messages can be sent to your provider as well.   To learn more about what you can do with MyChart, go to [email or web address].    Your next appointment:   In person with Dr. Mayford Knife - we will reach out to you to schedule

## 2023-01-22 NOTE — Telephone Encounter (Signed)
New Message:      Patient does not feel well enough to come in  the office. Husband wants to know if a Virtual Visit is possible please?

## 2023-01-23 ENCOUNTER — Ambulatory Visit: Admission: RE | Admit: 2023-01-23 | Payer: PPO | Source: Ambulatory Visit

## 2023-01-23 ENCOUNTER — Ambulatory Visit: Payer: PPO

## 2023-01-23 ENCOUNTER — Other Ambulatory Visit: Payer: Self-pay | Admitting: Family

## 2023-01-23 ENCOUNTER — Ambulatory Visit
Admission: RE | Admit: 2023-01-23 | Discharge: 2023-01-23 | Disposition: A | Discharge status: Home / Self Care | Payer: PPO | Source: Ambulatory Visit | Attending: Obstetrics & Gynecology | Admitting: Obstetrics & Gynecology

## 2023-01-23 DIAGNOSIS — R928 Other abnormal and inconclusive findings on diagnostic imaging of breast: Secondary | ICD-10-CM

## 2023-01-23 DIAGNOSIS — R5383 Other fatigue: Secondary | ICD-10-CM | POA: Diagnosis not present

## 2023-01-23 DIAGNOSIS — I10 Essential (primary) hypertension: Secondary | ICD-10-CM | POA: Diagnosis not present

## 2023-01-24 LAB — CBC
Hematocrit: 34.5 % (ref 34.0–46.6)
Hemoglobin: 11.3 g/dL (ref 11.1–15.9)
MCH: 29.1 pg (ref 26.6–33.0)
MCHC: 32.8 g/dL (ref 31.5–35.7)
MCV: 89 fL (ref 79–97)
Platelets: 188 x10E3/uL (ref 150–450)
RBC: 3.88 x10E6/uL (ref 3.77–5.28)
RDW: 14.1 % (ref 11.7–15.4)
WBC: 5.5 x10E3/uL (ref 3.4–10.8)

## 2023-01-24 LAB — THYROID PANEL WITH TSH
Free Thyroxine Index: 1.1 — ABNORMAL LOW (ref 1.2–4.9)
T3 Uptake Ratio: 20 % — ABNORMAL LOW (ref 24–39)
T4, Total: 5.7 ug/dL (ref 4.5–12.0)
TSH: 2.19 u[IU]/mL (ref 0.450–4.500)

## 2023-01-28 ENCOUNTER — Telehealth: Payer: Self-pay

## 2023-01-28 NOTE — Telephone Encounter (Signed)
Transition Care Management Unsuccessful Follow-up Telephone Call  Date of discharge and from where:  [facility] 8/5  Attempts:  1st Attempt  Reason for unsuccessful TCM follow-up call:  No answer/busy   Lenard Forth [facility] Guide, [facility] [phone] [address] [place], [place], [place] [address] Phone: [phone] Email: [email or web address]

## 2023-01-28 NOTE — Telephone Encounter (Signed)
Transition Care Management Unsuccessful Follow-up Telephone Call  Date of discharge and from where:  [facility]  Attempts:  2nd Attempt  Reason for unsuccessful TCM follow-up call:  No answer/busy   Lenard Forth [facility] Guide, [facility] [phone] [address] [place], [place], [place] [address] Phone: [phone] Email: [email or web address]

## 2023-01-29 ENCOUNTER — Telehealth: Payer: Self-pay

## 2023-01-29 NOTE — Telephone Encounter (Signed)
Called to get patient scheduled for appt with Dr. Mayford Knife and Earnest Bailey Rep, appt scheduled for 02/27/23 at 11 AM.

## 2023-01-29 NOTE — Telephone Encounter (Signed)
-----   Message from Armanda Magic sent at 01/26/2023  3:28 PM EDT ----- Yes she is having daytime sleepiness ----- Message ----- From: Luellen Pucker, RN Sent: 01/25/2023   2:40 PM EDT To: [name], MD  I have a note that I need to schedule this patient to see the Inspire Rep-but I don't know if that came from you or if C. Dan Humphreys was asking if patient needed to be seen? Alcario Drought

## 2023-02-02 DIAGNOSIS — J9611 Chronic respiratory failure with hypoxia: Secondary | ICD-10-CM | POA: Diagnosis not present

## 2023-02-04 ENCOUNTER — Encounter: Payer: PPO | Admitting: Internal Medicine

## 2023-02-05 ENCOUNTER — Other Ambulatory Visit: Payer: Self-pay | Admitting: Family Medicine

## 2023-02-05 DIAGNOSIS — S8264XA Nondisplaced fracture of lateral malleolus of right fibula, initial encounter for closed fracture: Secondary | ICD-10-CM | POA: Diagnosis not present

## 2023-02-05 NOTE — Telephone Encounter (Signed)
Pt's ex-husband is calling to request this be filled immediately, patient is having a reaction to not having meds in system, says she ran out in June

## 2023-02-06 NOTE — Telephone Encounter (Signed)
Calling to check on progress of refill of FLUoxetine (PROZAC) 40 MG capsule. Says it's an emergency, patient ran out in June

## 2023-02-12 ENCOUNTER — Telehealth: Payer: Self-pay | Admitting: Family Medicine

## 2023-02-12 ENCOUNTER — Encounter: Payer: PPO | Admitting: Licensed Clinical Social Worker

## 2023-02-12 ENCOUNTER — Ambulatory Visit: Payer: Self-pay | Admitting: Licensed Clinical Social Worker

## 2023-02-12 NOTE — Telephone Encounter (Signed)
Pt LOV was on 10/19/22 Last refill was done on 10/23/22 Please advise

## 2023-02-12 NOTE — Telephone Encounter (Signed)
Prescription Request  02/12/2023  LOV: 10/19/2022  What is the name of the medication or equipment? alprazolam alprazolam Prudy Feeler) 2 MG tablet  Have you contacted your pharmacy to request a refill? Yes   Which pharmacy would you like this sent to?  [facility] - [place], [place] - [address] [place] [address] Phone: [phone] Fax: [phone]    Patient notified that their request is being sent to the clinical staff for review and that they should receive a response within 2 business days.   Please advise at Mobile [phone] (mobile)

## 2023-02-13 MED ORDER — ALPRAZOLAM 2 MG PO TABS: 2.0000 mg | ORAL_TABLET | Freq: Three times a day (TID) | ORAL | 5 refills | Status: DC | PRN

## 2023-02-13 NOTE — Telephone Encounter (Signed)
Done

## 2023-02-15 NOTE — Patient Instructions (Signed)
Visit Information  Thank you for taking time to visit with me today. Please don't hesitate to contact me if I can be of assistance to you.   Following are the goals we discussed today:   Goals Addressed             This Visit's Progress    Management of MH Conditions   On track    Activities and task to complete in order to accomplish goals.   Keep all upcoming appointments discussed today Continue with compliance of taking medication prescribed by Doctor and utilizing oxygen machine. F/up with preferred pharmacy about medication refills Implement healthy coping skills discussed to assist with management of symptoms Continue working with [facility] care team to assist with goals identified Review listing of mental health professionals that provide psychoanalytic services F/up with [facility] to initiate services             Our next appointment is by telephone on 09/06 at 3 PM  Please call the care guide team at [phone] if you need to cancel or reschedule your appointment.   If you are experiencing a Mental Health or Behavioral Health Crisis or need someone to talk to, please call the Suicide and Crisis Lifeline: 988 call 911   Patient verbalizes understanding of instructions and care plan provided today and agrees to view in MyChart. Active MyChart status and patient understanding of how to access instructions and care plan via MyChart confirmed with patient.     [name], MSW, LCSW [facility] Care Management [place]  [facility] [email or web address] Phone [phone] 5:22 AM

## 2023-02-15 NOTE — Patient Outreach (Signed)
  Care Coordination   Follow Up Visit Note   02/12/2023 Name: Lakashia Fosco MRN: [id number] DOB: 12-19-1955  Kelise Kopera is a 67 y.o. year old female who sees Nelwyn Salisbury, MD for primary care. I spoke with  [name] by phone today.  What matters to the patients health and wellness today?  Symptom Management    Goals Addressed             This Visit's Progress    Management of MH Conditions   On track    Activities and task to complete in order to accomplish goals.   Keep all upcoming appointments discussed today Continue with compliance of taking medication prescribed by Doctor and utilizing oxygen machine. F/up with preferred pharmacy about medication refills Implement healthy coping skills discussed to assist with management of symptoms Continue working with [facility] care team to assist with goals identified Review listing of mental health professionals that provide psychoanalytic services F/up with [facility] to initiate services             SDOH assessments and interventions completed:  No     Care Coordination Interventions:  Yes, provided  Interventions Today    Flowsheet Row Most Recent Value  Chronic Disease   Chronic disease during today's visit --  [Bipolar Disorder, GAD, MDD]  General Interventions   General Interventions Discussed/Reviewed General Interventions Reviewed, [facility], Doctor Visits  Doctor Visits Discussed/Reviewed Doctor Visits Reviewed  Mental Health Interventions   Mental Health Discussed/Reviewed Mental Health Reviewed, Coping Strategies, Anxiety, Depression, Grief and Loss  Nutrition Interventions   Nutrition Discussed/Reviewed Nutrition Reviewed  Pharmacy Interventions   Pharmacy Dicussed/Reviewed Pharmacy Topics Reviewed, Medication Adherence       Follow up plan: Follow up call scheduled for 2-4 weeks    Encounter Outcome:  Pt. Visit Completed   [name], MSW, LCSW [facility] Care  Management [facility]  [facility] [email or web address] Phone [phone] 5:21 AM

## 2023-02-22 ENCOUNTER — Encounter: Payer: Self-pay | Admitting: Licensed Clinical Social Worker

## 2023-02-25 ENCOUNTER — Telehealth: Payer: Self-pay | Admitting: Licensed Clinical Social Worker

## 2023-02-25 NOTE — Patient Outreach (Signed)
  Care Coordination   02/22/2023 Name: [name] [name] MRN: [id number] DOB: December 26, 1955   Care Coordination Outreach Attempts:  An unsuccessful telephone outreach was attempted for a scheduled appointment today.  Follow Up Plan:  Additional outreach attempts will be made to offer the patient care coordination information and services.   Encounter Outcome:  No Answer   Care Coordination Interventions:  No, not indicated    [name], MSW, LCSW [facility] Care Management [place]  [facility] [email or web address] Phone [phone] 3:55 PM

## 2023-02-27 ENCOUNTER — Ambulatory Visit: Payer: PPO | Attending: Cardiology | Admitting: Cardiology

## 2023-02-27 ENCOUNTER — Ambulatory Visit: Payer: PPO | Admitting: Cardiology

## 2023-02-27 ENCOUNTER — Encounter: Payer: Self-pay | Admitting: Cardiology

## 2023-02-27 VITALS — BP 120/68 | HR 68 | Ht 63.0 in | Wt 190.0 lb

## 2023-02-27 DIAGNOSIS — G4733 Obstructive sleep apnea (adult) (pediatric): Secondary | ICD-10-CM

## 2023-02-27 DIAGNOSIS — I10 Essential (primary) hypertension: Secondary | ICD-10-CM

## 2023-02-27 NOTE — Progress Notes (Signed)
Date:  02/27/2023   ID:  [name] [name], DOB 1956/03/21, MRN [id number] The patient was identified using 2 identifiers.  Patient Location: Home Provider Location: Home Office   PCP:  Nelwyn Salisbury, MD   [facility] Providers Cardiologist:  Armanda Magic, MD     Evaluation Performed:  Follow-Up Visit  Chief Complaint:  OSA  History of Present Illness:    [name] [name] is a 67 y.o. female with a history of ADHD, bipolar disorder, hypertension, GERD, hyperlipidemia, obesity.  She has a hx of OSA on CPAP therapy.  Unfortunately she was intolerant to CPAP and requested ENT evaluation of  Inspire. Device.  She underwent hypoglossal nerve stimulator implant by Dr. Jenne Pane on 03/25/2019.     She was seen back on 05/01/2019 for device activation.  She was then seen in the office on 10/06/2019 and was complaining of feeling very sleepy and felt that her device need to be reprogrammed.  She was having a lot of daytime sleepiness as well as snoring.  Nerve stimulation was performed and her amplitude was increased from 1.7 to 2.5 V.  Her control range was changed to 2.3 to 3.3 V.  A few weeks later she had an in lab study done 04/30/2020 showed which showed no significant obstructive sleep apnea using her inspire device.     I then saw her back 07/31/2021 and she was doing great with her inspire device and was on level 5 which was 2.7 V.  She was feeling rested when she get up in the morning and had no daytime sleepiness.  She had no tongue sensitivity and her husband said she was very lightly snoring.  At that time an Itamar sleep study was ordered which showed moderate obstructive sleep apnea with an AHI of 17.9/h and O2 sats as low as 81%.   She is now brought back into the office for further evaluation due to an increase in her AHI.  At last office visit she was had really struggling with her inspire device and has not been able to sleep at night.  She was having a severe sore throat  when she would wake up up in the morning and felt like her tongue was all the way out of her mouth all night and very painful in the morning.   At that office visit  interrogation of her device was performed and showed that her thresholds had decreased to 1 V and it was likely that she had had a decrease in stimulation threshold over time due to healing and did not require a high pacing threshold any longer and was resulting in overstimulation of the hypoglossal nerve which was changing her anatomy  resulting in more apneas. Her parameters were changed with the stimulation range of 1.4 V to 1.8 V.  Her goal was to get to level 2 or 3 which would be 1.5 to 1.6 V.   She underwent Itamar sleep study 11/23/21 to make sure that her apneas were stable on current inspire settings.  This showed mild obstructive sleep apnea with an AHI 7.3/h   She was seen again 11.14.2023 due to recurrent snoring again even when she was on her side.  She had gained over 15 lbs since May.  She was on level 5 which is 1.8 volts.  She also had been more sleepy than usual.   She has been placed on Gabapentin that has caused weight gain. She also was just  dx with ILD and emphysema. The gabapentin makes her sleepy as well. Her voltage was increased but that resulted in significant protrusion of her tongue with angulation to the left.  She was kept at level 5 (1.8V).  She underwent Itamar HST which showed very mild OSA with an AHI of 9/hr and no central events.    Office visit today 06/07/2022 her device was interrogated.  She was at level 5 which was 1.8 V and testing at this voltage showed prominent left-sided tongue deviation. Suspected that this was distorting anatomy and causing her  to actually have more apneas and resulting in more snoring.  Her device was reprogrammed after testing her tongue motion at multiple levels.  1.4 V showed good tongue motion.  Her device was reprogrammed with a range of 1.2 to 1.6 V.  Her optimal level was  level 3 which is 1.4 V.  OV 07/2022 doing well.  Since then increase her level to 4 which is 1.5V. She is here today doing well.  She is sleeping on average 8.7 hours nightly and overall is 85% compliant in using her device at least 4 hours nightly.  She has been having problems with feeling sleepy during the day and napping.  She did not realize that she stopped her Prozac by accident a month ago and started having hallucinations.  She is now back on it. She has no tongue pain or sore throat. She is on level 4 (1.5V) with a range of 1.2 to 1.6V.[name] [name]    Past Medical History:  Diagnosis Date   Allergic rhinitis 01/23/2008   Angina pectoris 06/06/2016   Arthritis    Attention deficit hyperactivity disorder (ADHD) 02/07/2016   Back pain, lumbar 01/21/2009   Bell's palsy 02/06/2008   Bipolar disorder 12/13/2009   Chronic idiopathic constipation    Dizziness 04/16/2008   Edema, leg 01/21/2009   Epigastric pain    Essential hypertension 01/23/2008   Gastro-esophageal reflux disease without esophagitis 01/23/2008   Generalized anxiety disorder 01/23/2008   Headache 04/15/2007   Hidradenitis 10/04/2008   History of colonic polyps    Hyperlipidemia    Hypokalemia 07/23/2018   Insomnia 01/23/2008   Major depressive disorder 01/23/2008   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   Neck pain 05/06/2017   Obesity (BMI 30-39.9) 11/10/2015   OSA (obstructive sleep apnea) 04/15/2007   inspire implant; does not always remember to turn device on   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 Wynetta Emery   BREAST BIOPSY Right 1981   benign   BREAST EXCISIONAL BIOPSY Right 1981   benign   scar does not show   BREAST SURGERY Bilateral  12/11/2012   reductions per Dr. Ivin Booty in [facility]    CATARACT EXTRACTION, BILATERAL Bilateral last summer of 2018   at [facility]   COLONOSCOPY  03/07/2021   per Dr. Loreta Ave, adenomatous polyps, repeat in 7 yrs   DRUG INDUCED ENDOSCOPY N/A 01/30/2019   Procedure: DRUG INDUCED ENDOSCOPY;  Surgeon: Christia Reading, MD;  Location: [facility];  Service: ENT;  Laterality: N/A;   ESOPHAGOGASTRODUODENOSCOPY  04/23/2011   per Dr. Loreta Ave, normal    EYE SURGERY  06/18/2016   L eyelid    IMPLANTATION OF HYPOGLOSSAL NERVE STIMULATOR Right 03/25/2019   Procedure: IMPLANTATION OF HYPOGLOSSAL NERVE STIMULATOR;  Surgeon: Christia Reading, MD;  Location: [facility] OR;  Service: ENT;  Laterality: Right;   LEFT HEART CATH AND CORONARY ANGIOGRAPHY N/A 07/05/2022   Procedure: LEFT HEART CATH AND CORONARY ANGIOGRAPHY;  Surgeon: Swaziland, Peter M, MD;  Location: [facility] INVASIVE CV LAB;  Service: Cardiovascular;  Laterality: N/A;   LUMBAR DISC SURGERY  2003   Dr. Wynetta Emery   MULTIPLE TOOTH EXTRACTIONS     ORIF WRIST FRACTURE Right 08/29/2018   REDUCTION MAMMAPLASTY Bilateral 2013   SPLIT NIGHT STUDY  11/11/2015   UVULOPALATOPHARYNGOPLASTY     VAGINAL HYSTERECTOMY  05/1995   secondary to fibroids.     Current Meds  Medication Sig   acyclovir (ZOVIRAX) 400 MG tablet TAKE 1 TABLET BY MOUTH 4 TIMES A DAY AS NEEDED (Patient taking differently: Take 800 mg by mouth 2 (two) times daily as needed (for sores in nose).)   albuterol (PROVENTIL) (2.5 MG/3ML) 0.083% nebulizer solution Take 3 mLs (2.5 mg total) by nebulization every 6 (six) hours as needed for wheezing or shortness of breath.   albuterol (VENTOLIN HFA) 108 (90 Base) MCG/ACT inhaler Inhale 1-2 puffs into the lungs every 6 (six) hours as needed.   alprazolam (XANAX) 2 MG tablet Take 1 tablet (2 mg total) by mouth 3 (three) times daily as needed for anxiety.   Ascorbic Acid (VITAMIN C PO) Take by mouth.   aspirin EC 81 MG tablet Take 81 mg by mouth daily.  Swallow whole.   B Complex-C-Folic Acid (B-COMPLEX/VITAMIN C PO) Take 1 tablet by mouth daily.   Cholecalciferol (VITAMIN D3) 125 MCG (5000 UT) CAPS Take 5,000 Units by mouth once a week.   FLUoxetine (PROZAC) 40 MG capsule TAKE 1 CAPSULE (40 MG TOTAL) BY MOUTH DAILY.   Fluticasone-Umeclidin-Vilant (TRELEGY ELLIPTA) 100-62.5-25 MCG/ACT AEPB Inhale 1 puff into the lungs daily.   hydrocortisone 2.5 % cream PLACE 1 APPLICATION RECTALLY 2 TIMES DAILY.   Magnesium 500 MG CAPS Take 500 mg by mouth daily.   MAGNESIUM PO Take by mouth.   Multiple Vitamins-Minerals (MULTIVITAMIN WITH MINERALS) tablet Take 1 tablet by mouth daily.   omeprazole (PRILOSEC) 20 MG capsule TAKE 1 CAPSULE BY MOUTH 2 TIMES DAILY BEFORE A MEAL.   polyethylene glycol (MIRALAX / GLYCOLAX) 17 g packet Use one packet daily (Patient taking differently: Take 17 g by mouth 2 (two) times daily as needed for moderate constipation.)   potassium chloride (KLOR-CON) 10 MEQ tablet TAKE 1 TABLET (10 MEQ TOTAL) BY MOUTH DAILY.   rosuvastatin (CRESTOR) 40 MG tablet TAKE 1 TABLET (40 MG TOTAL) BY MOUTH DAILY.   triamterene-hydrochlorothiazide (DYAZIDE) 37.5-25 MG capsule Take 1 capsule by mouth every morning.     Allergies:   Sulfonamide derivatives, Flexeril [cyclobenzaprine], Hydromorphone, Morphine and codeine, and Sulfamethoxazole   Social History   Tobacco Use   Smoking status: Former    Current packs/day: 0.00    Types: Cigarettes    Start date: 12/1990    Quit date: 12/2020    Years since quitting: 2.2    Passive exposure: Past (occasional)   Smokeless tobacco: Never  Vaping Use   Vaping status: Former  Substance Use Topics   Alcohol use: Not Currently   Drug use: No     Family Hx: The patient's family history includes Alcohol abuse in an other family member; Arthritis in an other family member; Asthma in  her father; Coronary artery disease in her brother, sister, and another family member; Dementia in her mother;  Depression in an other family member; Diabetes in an other family member; Hyperlipidemia in her brother, mother, sister, sister, and another family member; Hypertension in her brother, sister, sister, and another family member; Sudden death in an other family member; Thyroid disease in her sister.  ROS:   Please see the history of present illness.     All other systems reviewed and are negative.   Prior Sleep studies:   The following studies were reviewed today:  none  Labs/Other Tests and Data Reviewed:     Recent Labs: 01/23/2023: ALT 18; BNP 4.8; BUN 13; Creatinine, Ser 0.86; Hemoglobin 11.3; Platelets 188; Potassium 4.0; Sodium 140; TSH 2.190   Wt Readings from Last 3 Encounters:  02/27/23 190 lb (86.2 kg)  01/22/23 187 lb (84.8 kg)  01/21/23 190 lb (86.2 kg)     Risk Assessment/Calculations:      Objective:    Vital Signs:  BP 120/68   Pulse 68   Ht 5\' 3"  (1.6 m)   Wt 190 lb (86.2 kg)   SpO2 97%   BMI 33.66 kg/m   GEN: Well nourished, well developed in no acute distress HEENT: Normal NECK: No JVD; No carotid bruits LYMPHATICS: No lymphadenopathy CARDIAC:RRR, no murmurs, rubs, gallops RESPIRATORY:  Clear to auscultation without rales, wheezing or rhonchi  ABDOMEN: Soft, non-tender, non-distended MUSCULOSKELETAL:  No edema; No deformity  SKIN: Warm and dry NEUROLOGIC:  Alert and oriented x 3 PSYCHIATRIC:  Normal affect  ASSESSMENT & PLAN:     OSA -intolerant to CPAP therapy -s/p hypoglossal nerve stimulator -As stated above her stimulation range was increased to 2.3-3.3V at office visit 09/2019 with repeat sleep study in November 2021 showing no residual sleep apnea -Itamar sleep study was done 08/08/2021 for follow-up which showed moderate obstructive sleep apnea with an AHI of 17.9/h -At last OV she was having a lot of difficulty with sore throat in the morning as well as feeling like her tongue is out of her mouth the hallway all night and has not been  sleeping well at all -Her thresholds had decreased to 1 V and it was felt that she had had a decrease in stimulation threshold over time due to healing and did not require a high pacing threshold any longef resulting in overstimulation of the hypoglossal nerve which is changing her anatomy and was resulting in more apneas. -Her parameters were changed with the stimulation range of 1.4 V to 1.8 V with a  goal  to get to level 2 or 3 which would be 1.5 to 1.6 V. -She is doing great on the current settings and is on level 5 with complete resolution of her mouth dryness and sore throat and has no daytime sleepiness -A Itamar home sleep study in June 2023 showed residual mild obstructive sleep apnea on level 5 at 1.8V with an AHI of 7.3/h but down from 17/hour at her baseline study -OV  05/01/2022 she had increased snoring on her side and had gained 15lbs since May and suspect that she is having more apneas>>increased voltage was tried in office but had significant protrusion with angulation to the left >> kept at  level 5 at 1.8V  -Repeat HST 05/13/2022 with AHI 9/hr -Office visit 06/07/2022 her device was interrogated.  She was at level 5 which was 1.8 V and testing at this voltage showed prominent left-sided tongue deviation.  Suspected  that this may be distorting anatomy and causing her  to actually have more apneas and resulting in more snoring.  Her device was reprogrammed after testing her tongue motion at multiple levels.  1.4 V showed good tongue motion.  Her device was reprogrammed with a range of 1.2 to 1.6 V.  Her optimal level will be level 3 which is 1.4 V. -OV  07/19/21 she was doing well and had lost 9lbs.  She was continued on her current setting of level 3 which is 1.4V. -OV today 02/27/2023:  doing well on level 4 (1.5V)>>no changes made -will followup in 6 months with Inspire rep   2.  Hypertension -BP controlled on exam today -continue prescription drug management with Dyazine 37.5-25mg   daily with PRN refills   Medication Adjustments/Labs and Tests Ordered: Current medicines are reviewed at length with the patient today.  Concerns regarding medicines are outlined above.   Tests Ordered: No orders of the defined types were placed in this encounter.   Medication Changes: No orders of the defined types were placed in this encounter.   Follow Up:  In Person in 4 week(s) with Inspire Rep  Signed, Armanda Magic, MD  02/27/2023 1:05 PM    [facility]

## 2023-02-27 NOTE — Patient Instructions (Signed)
Medication Instructions:  Your physician recommends that you continue on your current medications as directed. Please refer to the Current Medication list given to you today.  *If you need a refill on your cardiac medications before your next appointment, please call your pharmacy*   Lab Work: None.  If you have labs (blood work) drawn today and your tests are completely normal, you will receive your results only by: MyChart Message (if you have MyChart) OR A paper copy in the mail If you have any lab test that is abnormal or we need to change your treatment, we will call you to review the results.   Testing/Procedures: None.   Follow-Up:     Your next appointment will be Monday, 09/02/22 at 10:20 AM with Dr. Mayford Knife and [employer]arnest Bailey Rep.

## 2023-03-05 DIAGNOSIS — J9611 Chronic respiratory failure with hypoxia: Secondary | ICD-10-CM | POA: Diagnosis not present

## 2023-03-12 DIAGNOSIS — S8264XD Nondisplaced fracture of lateral malleolus of right fibula, subsequent encounter for closed fracture with routine healing: Secondary | ICD-10-CM | POA: Diagnosis not present

## 2023-04-04 DIAGNOSIS — J9611 Chronic respiratory failure with hypoxia: Secondary | ICD-10-CM | POA: Diagnosis not present

## 2023-04-25 ENCOUNTER — Telehealth (HOSPITAL_BASED_OUTPATIENT_CLINIC_OR_DEPARTMENT_OTHER): Payer: Self-pay | Admitting: Pulmonary Disease

## 2023-04-25 ENCOUNTER — Ambulatory Visit (HOSPITAL_BASED_OUTPATIENT_CLINIC_OR_DEPARTMENT_OTHER): Payer: PPO | Admitting: Pulmonary Disease

## 2023-04-25 ENCOUNTER — Other Ambulatory Visit (HOSPITAL_BASED_OUTPATIENT_CLINIC_OR_DEPARTMENT_OTHER): Payer: Self-pay

## 2023-04-25 ENCOUNTER — Other Ambulatory Visit: Payer: Self-pay | Admitting: *Deleted

## 2023-04-25 DIAGNOSIS — R059 Cough, unspecified: Secondary | ICD-10-CM

## 2023-04-25 DIAGNOSIS — J849 Interstitial pulmonary disease, unspecified: Secondary | ICD-10-CM

## 2023-04-25 LAB — PULMONARY FUNCTION TEST
DL/VA % pred: 72 %
DL/VA: 3.07 ml/min/mmHg/L
DLCO cor % pred: 63 %
DLCO cor: 11.64 ml/min/mmHg
DLCO unc % pred: 63 %
DLCO unc: 11.64 ml/min/mmHg
FEF 25-75 Post: 2.97 L/s
FEF 25-75 Pre: 2.79 L/s
FEF2575-%Change-Post: 6 %
FEF2575-%Pred-Post: 154 %
FEF2575-%Pred-Pre: 145 %
FEV1-%Change-Post: 1 %
FEV1-%Pred-Post: 98 %
FEV1-%Pred-Pre: 97 %
FEV1-Post: 2.15 L
FEV1-Pre: 2.11 L
FEV1FVC-%Change-Post: 0 %
FEV1FVC-%Pred-Pre: 112 %
FEV6-%Change-Post: 0 %
FEV6-%Pred-Post: 90 %
FEV6-%Pred-Pre: 89 %
FEV6-Post: 2.46 L
FEV6-Pre: 2.45 L
FEV6FVC-%Pred-Post: 104 %
FEV6FVC-%Pred-Pre: 104 %
FVC-%Change-Post: 1 %
FVC-%Pred-Post: 86 %
FVC-%Pred-Pre: 85 %
FVC-Post: 2.48 L
FVC-Pre: 2.45 L
Post FEV1/FVC ratio: 87 %
Post FEV6/FVC ratio: 100 %
Pre FEV1/FVC ratio: 86 %
Pre FEV6/FVC Ratio: 100 %
RV % pred: 61 %
RV: 1.25 L
TLC % pred: 87 %
TLC: 4.15 L

## 2023-04-25 MED ORDER — AZITHROMYCIN 250 MG PO TABS: ORAL_TABLET | ORAL | 0 refills | Status: DC

## 2023-04-25 MED ORDER — PREDNISONE 10 MG PO TABS: ORAL_TABLET | ORAL | 0 refills | Status: AC

## 2023-04-25 NOTE — Progress Notes (Signed)
Full PFT Performed Today  

## 2023-04-25 NOTE — Telephone Encounter (Signed)
Spouse states that patient has been in severe pain in her upper chest area. He is wodering if something could be prescribed for her pain. He is also concerned pt could have tuberclurosis. Patient did PFT on 11/7. He is not wanting to wait for 11/14 for the results. [facility] is the pharmacy. Marking as urgent to please advise on PFT results ASAP. Please advise.

## 2023-04-25 NOTE — Telephone Encounter (Signed)
Called and spoke with patient and Melvern Sample, provided results/recommendations per Dr. Vassie Loll.  They will get an OTC covid/flu test and let us know if the results are positive.  Verified pharmacy and meds sent.  Nothing further needed.

## 2023-04-25 NOTE — Telephone Encounter (Signed)
Called and spoke to patient's spouse, John(DPR). John stated that patient is experiencing bilateral chest discomfort that radiates into her ribs and down her back, nasal congestion,prod cough with white sputum, increased SOB with exertion, wheezing x1wk. John stated that patient is burning up but he has not measured her temp She occ vomits due to coughing.     She is using albuterol solution TID and trelegy once daily. She wears 2L at bedtime and PRN. Spo2 has dropped as low as 82% with exertion but recovered to 90% with rest. Patient currently at [facility] office doing PFT.   Dr. Vassie Loll, please advise. Thanks

## 2023-04-25 NOTE — Patient Instructions (Signed)
Full PFT Performed Today  

## 2023-04-26 NOTE — Telephone Encounter (Signed)
Is asking if a chest xray is possible. Mentioned TB again and states they have picked up the medicine

## 2023-04-26 NOTE — Telephone Encounter (Signed)
Pt and Husband requesting cxr

## 2023-04-30 ENCOUNTER — Ambulatory Visit (HOSPITAL_BASED_OUTPATIENT_CLINIC_OR_DEPARTMENT_OTHER): Payer: PPO

## 2023-04-30 DIAGNOSIS — R059 Cough, unspecified: Secondary | ICD-10-CM | POA: Diagnosis not present

## 2023-04-30 DIAGNOSIS — R079 Chest pain, unspecified: Secondary | ICD-10-CM | POA: Diagnosis not present

## 2023-04-30 DIAGNOSIS — R0602 Shortness of breath: Secondary | ICD-10-CM | POA: Diagnosis not present

## 2023-04-30 DIAGNOSIS — R918 Other nonspecific abnormal finding of lung field: Secondary | ICD-10-CM | POA: Diagnosis not present

## 2023-04-30 DIAGNOSIS — I700 Atherosclerosis of aorta: Secondary | ICD-10-CM | POA: Diagnosis not present

## 2023-04-30 NOTE — Telephone Encounter (Signed)
ATC John Mullan (DPR), LVM to return call and that I would call Ms. Binz as well.  Called and spoke with Ms. Sherral Hammers and John on her # and verified that she is feeling better since she finished the zpack, she is still on the prednisone.  They want the CXR to be done at [facility], advised I will put in the order and they can go and have it done when it is a good time for them.  They verified understanding.  Order placed.  Nothing further needed.

## 2023-04-30 NOTE — Addendum Note (Signed)
Addended by: Delrae Rend on: 04/30/2023 08:38 AM   Modules accepted: Orders

## 2023-05-01 ENCOUNTER — Telehealth: Payer: Self-pay | Admitting: Cardiology

## 2023-05-01 NOTE — Telephone Encounter (Signed)
Patient doesn't feel her Inspire device moves her tongue as it should.  Patient recently had an x-ray and they think it may have affected the device.  The device was still on when the x-ray was taken.  Patient doesn't feel her tongue move when she turns on the device. She feels she needs to come on for a diagnostic check on the device.

## 2023-05-01 NOTE — Telephone Encounter (Signed)
Call to patient to discuss concerns. Patient states she is being worked up for tuberculosis and has not been feeling well. She forgot to turn her Inspire device off before an Xray on 04/30/23 and now she feels like it is not working. She states that last night she tried to turn it on and she doesn't think it came on. She is requesting a diagnostic check with [employer] Bailey rep as soon as possible. Forwarded concerns to Dr. Mayford Knife and to [facility].

## 2023-05-03 NOTE — Telephone Encounter (Signed)
Call to Dr. Dionicio Stall office, [facility] regarding patient needing appt to have Inspire device looked at. Patient has an appt with Dr. Jenne Pane on 06/06/23.  Call to patient to review instructions sent by Inspire rep to reset remote to generator:   "Is the patient able to confirm that she has new batteries in her remote, press the Green button on her remote and then take it to her chest (right side) and hold the remote over her generator until she hears 2 beeps, and then see what color the light turns on the remote?  White is off and Chilton Si is on."  Patient was able to complete these steps. She states the green light came on, she heard a beep and she felt her tongue move. Patient stated she would try to operate the implant when her husband comes home as she is uncomfortable using it herself. She states she will call Dr. Dionicio Stall office to see if she can get in sooner than 06/06/23 if it is still not operating correctly.

## 2023-05-05 DIAGNOSIS — J9611 Chronic respiratory failure with hypoxia: Secondary | ICD-10-CM | POA: Diagnosis not present

## 2023-05-06 NOTE — Telephone Encounter (Signed)
Call to patient to confirm her Inspire device is still working correctly. Patient states "it has been working great".

## 2023-05-07 ENCOUNTER — Encounter (HOSPITAL_BASED_OUTPATIENT_CLINIC_OR_DEPARTMENT_OTHER): Payer: Self-pay | Admitting: Pulmonary Disease

## 2023-05-07 ENCOUNTER — Telehealth: Payer: Self-pay | Admitting: Family Medicine

## 2023-05-07 ENCOUNTER — Telehealth: Payer: Self-pay | Admitting: Cardiology

## 2023-05-07 ENCOUNTER — Ambulatory Visit (HOSPITAL_BASED_OUTPATIENT_CLINIC_OR_DEPARTMENT_OTHER): Payer: PPO | Admitting: Pulmonary Disease

## 2023-05-07 VITALS — BP 128/76 | HR 84 | Ht 65.0 in | Wt 195.6 lb

## 2023-05-07 DIAGNOSIS — J9611 Chronic respiratory failure with hypoxia: Secondary | ICD-10-CM | POA: Diagnosis not present

## 2023-05-07 DIAGNOSIS — M546 Pain in thoracic spine: Secondary | ICD-10-CM | POA: Diagnosis not present

## 2023-05-07 DIAGNOSIS — F314 Bipolar disorder, current episode depressed, severe, without psychotic features: Secondary | ICD-10-CM

## 2023-05-07 DIAGNOSIS — F900 Attention-deficit hyperactivity disorder, predominantly inattentive type: Secondary | ICD-10-CM

## 2023-05-07 DIAGNOSIS — J849 Interstitial pulmonary disease, unspecified: Secondary | ICD-10-CM

## 2023-05-07 DIAGNOSIS — G8929 Other chronic pain: Secondary | ICD-10-CM

## 2023-05-07 NOTE — Patient Instructions (Addendum)
X HRCT chest in late jan 2024  Decrease tylenol to 3g/day We discussed anti fibrotic medication

## 2023-05-07 NOTE — Assessment & Plan Note (Signed)
Continue using oxygen during exertion.  I offered her pulmonary rehab but due to her chronic dizziness, pain issues she wanted to hold off

## 2023-05-07 NOTE — Telephone Encounter (Signed)
Pt called and states she spoke to [facility] and they require a referral in order for pt to be seen by a psychiatrist. She is requesting for Dr. Clent Ridges to place referral.

## 2023-05-07 NOTE — Assessment & Plan Note (Signed)
Will obtain high-resolution CT chest to look for disease progression. PFTs show slight drop in DLCO from 13.7-11.6, FVC is only dropped by 5% which may be within normal variation I again discussed possible start of antifibrotic.  She is concerned that she has ongoing symptoms and would not be able to tolerate the side effect profile of this medication.  She also has high level of Tylenol use.  Will give her another 3 to 4 months to see if we can get a better handle on her other symptoms

## 2023-05-07 NOTE — Assessment & Plan Note (Signed)
I asked her to reduce Tylenol to about 3 g/day, she is already on a statin and we can have to consider starting antifibrotic in the future. I feel that a lot of her issues may be related to ongoing stressors and she would do well to see a psychiatric counselor

## 2023-05-07 NOTE — Telephone Encounter (Signed)
[name] [name] is returning call

## 2023-05-07 NOTE — Progress Notes (Signed)
Subjective:    Patient ID: [name] [name], female    DOB: [birth year], 67 y.o.   MRN: [id number]  HPI  67 yo exsmoker for follow-up of emphysema, ILD and pulmonary nodule OSA - -hypoglossal nerve stimulator implant by Dr. Jenne Pane on 03/25/2019.   -Currently set at level 3=1.4 V (Dr Mayford Knife 08/2022)   Appears to date back to 05/2016 on CT abdomen She smoked for about 15 pack years, a pack would last for 3 days until she quit in July 2022.  Serology in the remote past in 2008 was negative ANA, RA factor, ACE level was 46   PMH - ADHD, bipolar disorder, hypertension  09/2022 On ambulation she desaturated from 96 to 83% after 1 lap and was placed on 2 L pulse and was able to maintain oxygen saturation >> started on O2 -discussed antifibrotic's with her and side effect profile. She would like to avoid it at this point unless there is definite indication of disease progression   11/7 call - bilateral chest discomfort that radiates into her ribs and down her back, nasal congestion,prod cough with white sputum, increased SOB with exertion, wheezing x1wk She was given Z-Pak and prednisone, tested negative for COVID She wears 2L at bedtime and PRN. Spo2 has dropped as low as 82% with exertion but recovered to 90% with rest.   Numerous questions and issues today. -She has chronic dizziness and ambulates with a walker -She has ongoing chest pain radiating to her back in size and also lumbar back pain this has been ongoing ongoing for quite a few months she is taking Tylenol up to 4 g/day and has started taking Goody powder now.  She is not sure if this pain is coming from her lungs or her back -She reports under a lot of stress due to her daughter and grandson's issues.  She does not cope well with stress and has had no respect in the past.  We obtain chest x-ray on 11/7 which did not show any infiltrates. Reviewed PFTs today Reviewed rheumatology consultation DR Dimple Casey, labs were repeated, RNP  remains borderline but he does not feel that she has any kind of inflammation/MCTD     Significant tests/ events reviewed   Serology 06/2022 ENA RNP + 1.0  LHC 06/2022 LVEDP 25, non obs CAD   PFTs 05/2022 no airway obstruction, ratio 84, FEV1 100%, FVC 91%, TLC 99%, DLCO 13.73/74%   06/2022  HRCT chest>> probable UIP pattern, left upper lobe nodule unchanged   HRCT 01/2022 >> emphysema, probable UIP, dates back to 05/2016, unchanged left upper lobe nodule   CT chest 05/2021 showed emphysema, 13 mm left upper lobe nodule peripheral and bibasilar and peripheral reticular markings, 11 mm right paratracheal lymph node    CT chest with contrast 09/2021 -showed unchanged left upper lobe nodule and similar changes of ILD.   CT angiogram chest 11/2021 showed right paratracheal lymph node 1.3 cm, unchanged nodule and similar peripheral reticular opacities   04/2020 Inspire titration  07/2021 HST showed AHI of 18/hour on 2.7 V?  Overstimulation HST 11/2021 (on Inspire) mild, AHI 7/h ,low desat 86% on 1.8 V  Review of Systems neg for any significant sore throat, dysphagia, itching, sneezing, nasal congestion or excess/ purulent secretions, fever, chills, sweats, unintended wt loss, pleuritic or exertional cp, hempoptysis, orthopnea pnd or change in chronic leg swelling. Also denies presyncope, palpitations, heartburn, abdominal pain, nausea, vomiting, diarrhea or change in bowel or urinary habits, dysuria,hematuria, rash, arthralgias, visual  complaints, headache, numbness weakness or ataxia.     Objective:   Physical Exam  Gen. Pleasant, obese, in no distress ENT - no lesions, no post nasal drip Neck: No JVD, no thyromegaly, no carotid bruits Lungs: no use of accessory muscles, no dullness to percussion, bibasal dry crackles Cardiovascular: Rhythm regular, heart sounds  normal, no murmurs or gallops, no peripheral edema Musculoskeletal: No deformities, no cyanosis or clubbing , no tremors        Assessment & Plan:

## 2023-05-08 NOTE — Telephone Encounter (Signed)
I did the referral 

## 2023-06-02 ENCOUNTER — Other Ambulatory Visit: Payer: Self-pay

## 2023-06-02 ENCOUNTER — Emergency Department (HOSPITAL_BASED_OUTPATIENT_CLINIC_OR_DEPARTMENT_OTHER)
Admission: EM | Admit: 2023-06-02 | Discharge: 2023-06-02 | Disposition: A | Discharge status: Home / Self Care | Payer: PPO | Attending: Emergency Medicine | Admitting: Emergency Medicine

## 2023-06-02 ENCOUNTER — Encounter (HOSPITAL_BASED_OUTPATIENT_CLINIC_OR_DEPARTMENT_OTHER): Payer: Self-pay

## 2023-06-02 ENCOUNTER — Emergency Department (HOSPITAL_BASED_OUTPATIENT_CLINIC_OR_DEPARTMENT_OTHER): Payer: PPO

## 2023-06-02 ENCOUNTER — Emergency Department (HOSPITAL_BASED_OUTPATIENT_CLINIC_OR_DEPARTMENT_OTHER): Payer: PPO | Admitting: Radiology

## 2023-06-02 DIAGNOSIS — R55 Syncope and collapse: Secondary | ICD-10-CM | POA: Diagnosis not present

## 2023-06-02 DIAGNOSIS — Z043 Encounter for examination and observation following other accident: Secondary | ICD-10-CM | POA: Diagnosis not present

## 2023-06-02 DIAGNOSIS — R410 Disorientation, unspecified: Secondary | ICD-10-CM | POA: Diagnosis not present

## 2023-06-02 DIAGNOSIS — H532 Diplopia: Secondary | ICD-10-CM | POA: Diagnosis not present

## 2023-06-02 DIAGNOSIS — R0781 Pleurodynia: Secondary | ICD-10-CM | POA: Diagnosis not present

## 2023-06-02 DIAGNOSIS — R519 Headache, unspecified: Secondary | ICD-10-CM | POA: Insufficient documentation

## 2023-06-02 DIAGNOSIS — I700 Atherosclerosis of aorta: Secondary | ICD-10-CM | POA: Diagnosis not present

## 2023-06-02 DIAGNOSIS — R059 Cough, unspecified: Secondary | ICD-10-CM | POA: Insufficient documentation

## 2023-06-02 DIAGNOSIS — Z4789 Encounter for other orthopedic aftercare: Secondary | ICD-10-CM | POA: Diagnosis not present

## 2023-06-02 DIAGNOSIS — R296 Repeated falls: Secondary | ICD-10-CM | POA: Diagnosis not present

## 2023-06-02 DIAGNOSIS — W01198A Fall on same level from slipping, tripping and stumbling with subsequent striking against other object, initial encounter: Secondary | ICD-10-CM | POA: Diagnosis not present

## 2023-06-02 DIAGNOSIS — Z981 Arthrodesis status: Secondary | ICD-10-CM | POA: Diagnosis not present

## 2023-06-02 DIAGNOSIS — Z7982 Long term (current) use of aspirin: Secondary | ICD-10-CM | POA: Diagnosis not present

## 2023-06-02 DIAGNOSIS — R2689 Other abnormalities of gait and mobility: Secondary | ICD-10-CM

## 2023-06-02 DIAGNOSIS — I6523 Occlusion and stenosis of bilateral carotid arteries: Secondary | ICD-10-CM | POA: Diagnosis not present

## 2023-06-02 DIAGNOSIS — R42 Dizziness and giddiness: Secondary | ICD-10-CM | POA: Diagnosis not present

## 2023-06-02 DIAGNOSIS — I708 Atherosclerosis of other arteries: Secondary | ICD-10-CM | POA: Diagnosis not present

## 2023-06-02 DIAGNOSIS — W19XXXA Unspecified fall, initial encounter: Secondary | ICD-10-CM

## 2023-06-02 DIAGNOSIS — R918 Other nonspecific abnormal finding of lung field: Secondary | ICD-10-CM | POA: Diagnosis not present

## 2023-06-02 LAB — CBC
HCT: 39.7 % (ref 36.0–46.0)
Hemoglobin: 13.4 g/dL (ref 12.0–15.0)
MCH: 29.5 pg (ref 26.0–34.0)
MCHC: 33.8 g/dL (ref 30.0–36.0)
MCV: 87.4 fL (ref 80.0–100.0)
Platelets: 212 10*3/uL (ref 150–400)
RBC: 4.54 MIL/uL (ref 3.87–5.11)
RDW: 14.6 % (ref 11.5–15.5)
WBC: 7.9 10*3/uL (ref 4.0–10.5)
nRBC: 0 % (ref 0.0–0.2)

## 2023-06-02 LAB — BASIC METABOLIC PANEL
Anion gap: 10 (ref 5–15)
BUN: 16 mg/dL (ref 8–23)
CO2: 31 mmol/L (ref 22–32)
Calcium: 10.3 mg/dL (ref 8.9–10.3)
Chloride: 97 mmol/L — ABNORMAL LOW (ref 98–111)
Creatinine, Ser: 1.11 mg/dL — ABNORMAL HIGH (ref 0.44–1.00)
GFR, Estimated: 54 mL/min — ABNORMAL LOW (ref >60)
Glucose, Bld: 93 mg/dL (ref 70–99)
Potassium: 3.3 mmol/L — ABNORMAL LOW (ref 3.5–5.1)
Sodium: 138 mmol/L (ref 135–145)

## 2023-06-02 MED ORDER — ONDANSETRON HCL 4 MG/2ML IJ SOLN
4.0000 mg | Freq: Once | INTRAMUSCULAR | Status: AC
  Administered 2023-06-02: 4 mg via INTRAVENOUS
  Filled 2023-06-02: qty 2

## 2023-06-02 MED ORDER — IOHEXOL 350 MG/ML SOLN
75.0000 mL | Freq: Once | INTRAVENOUS | Status: AC | PRN
  Administered 2023-06-02: 75 mL via INTRAVENOUS

## 2023-06-02 MED ORDER — SODIUM CHLORIDE 0.9 % IV BOLUS
1000.0000 mL | Freq: Once | INTRAVENOUS | Status: AC
  Administered 2023-06-02: 1000 mL via INTRAVENOUS

## 2023-06-02 MED ORDER — ACETAMINOPHEN 325 MG PO TABS
650.0000 mg | ORAL_TABLET | Freq: Once | ORAL | Status: AC
  Administered 2023-06-02: 650 mg via ORAL
  Filled 2023-06-02: qty 2

## 2023-06-02 NOTE — ED Provider Triage Note (Signed)
Emergency Medicine Provider Triage Evaluation Note  [name] [name] , a 67 y.o. female  was evaluated in triage.  Pt complains of fall. 4 days ago past was standing in the kitchen and fell backwards, hitting her head. Momentary LOC. No blood thinners but is taking baby aspirin daily. She reports blurred vision when she feels dizzy. She states that the dizziness is positional. No N/V, weakness, or slurred speech.  Review of Systems  Positive: Intermittent dizziness & blurred vision Negative: Weakness   Physical Exam  BP (!) 145/88   Pulse 76   Temp 99.2 F (37.3 C)   Resp 14   SpO2 95%  Gen:   Awake, no distress   Resp:  Normal effort  MSK:   Moves extremities without difficulty  Other:    Medical Decision Making  Medically screening exam initiated at 2:27 PM.  Appropriate orders placed.  [name] was informed that the remainder of the evaluation will be completed by another provider, this initial triage assessment does not replace that evaluation, and the importance of remaining in the ED until their evaluation is complete.   Maxwell Marion, PA-C 06/02/23 1438

## 2023-06-02 NOTE — Discharge Instructions (Addendum)
1.  Follow-up with your neurologist and ear nose throat specialist to soon as possible. 2.  Try to minimize use of any sedating medications such as Xanax as they can increase your risk of fall.  Do not stop this medication abruptly because it can put you into withdrawal.  Discussed this with your doctor. 3.  You have an increased fall risk.  You should use a walker and be very careful with your transitions from sitting to standing or position changes with turning especially.

## 2023-06-02 NOTE — ED Provider Notes (Incomplete)
I provided a substantive portion of the care of this patient.  I personally made/approved the management plan for this patient and take responsibility for the patient management. {Remember to document shared critical care using "edcritical" dot phrase:1}    

## 2023-06-02 NOTE — ED Notes (Signed)
As Pt was talking to me while in the room Pt would have periods of some confusion. Pt would drift off back to sleep and would wake up talking with slurred speech and fall right back to sleep. Pt stated she is very dizzy and it is very hard for her to be able to get around. Pt stated she is tired of feeling this way and has seen Dr's and specialist for the same complaints. Pt stated no one has been able to tell her what is going on with her.

## 2023-06-02 NOTE — ED Provider Notes (Signed)
[facility] AT [facility] Provider Note   CSN: [id number] Arrival date & time: 06/02/23  1353     History  Chief Complaint  Patient presents with   [name] [name] is a 67 y.o. female with frequent falls and vertigo presents following a fall on December 4.  Patient was reported that she was standing in the kitchen and suddenly fell back and landed on the back of her head.  She lost consciousness.  The fall was not witnessed.  Her family members heard the fall and came up and woke the patient up.  Reports that patient was slightly confused following the fall.  There is some concern that she has had a seizure in the past but she is not on seizure medication currently.  Family does not suspect a seizure this time.  No reports of urinary incontinence following the fall.  No lateral tongue laceration.  Patient is followed with ENT to discuss possible tube placements.  She recently had a sinus infection was treated with azithromycin.  She does report some continued sinus tenderness today.  Since the fall family reports that she has been sleeping more.  Describes some right rib pain, I believe she had fallen against a chair.  And today started vomiting and reported some vision changes.  Described as "it takes a while for my eyes to adjust."  Denies any blurry vision or diplopia.  No abdominal pain or diarrhea.  No fevers.  She does have a cough at baseline given chronic respiratory issues.  She does endorse mild frontal headache that has been constant since the fall.  No extremity weakness or numbness.   Fall Associated symptoms include headaches.       Home Medications Prior to Admission medications   Medication Sig Start Date End Date Taking? Authorizing Provider  acyclovir (ZOVIRAX) 400 MG tablet TAKE 1 TABLET BY MOUTH 4 TIMES A DAY AS NEEDED Patient taking differently: Take 800 mg by mouth 2 (two) times daily as needed (for sores in nose). 03/20/22    Nelwyn Salisbury, MD  albuterol (PROVENTIL) (2.5 MG/3ML) 0.083% nebulizer solution Take 3 mLs (2.5 mg total) by nebulization every 6 (six) hours as needed for wheezing or shortness of breath. 06/25/22   [name], MD  albuterol (VENTOLIN HFA) 108 (90 Base) MCG/ACT inhaler Inhale 1-2 puffs into the lungs every 6 (six) hours as needed. 12/26/22   Parrett, Virgel Bouquet, NP  alprazolam Prudy Feeler) 2 MG tablet Take 1 tablet (2 mg total) by mouth 3 (three) times daily as needed for anxiety. 02/13/23   Nelwyn Salisbury, MD  Ascorbic Acid (VITAMIN C PO) Take by mouth.    [provider]  aspirin EC 81 MG tablet Take 81 mg by mouth daily. Swallow whole.    [provider]  Aspirin-Acetaminophen-Caffeine (GOODYS EXTRA STRENGTH PO) Take by mouth.    [provider]  azithromycin (ZITHROMAX) 250 MG tablet Take 2 tablets on the first day and then 1 tablet daily starting on day 2 until gone. 04/25/23   [name], MD  B Complex-C-Folic Acid (B-COMPLEX/VITAMIN C PO) Take 1 tablet by mouth daily.    [provider]  Cholecalciferol (VITAMIN D3) 125 MCG (5000 UT) CAPS Take 5,000 Units by mouth once a week.    [provider]  FLUoxetine (PROZAC) 40 MG capsule TAKE 1 CAPSULE (40 MG TOTAL) BY MOUTH DAILY. 02/06/23   Burchette, Elberta Fortis, MD  Fluticasone-Umeclidin-Vilant (  TRELEGY ELLIPTA) 100-62.5-25 MCG/ACT AEPB Inhale 1 puff into the lungs daily. 07/10/22   [name], MD  hydrocortisone 2.5 % cream PLACE 1 APPLICATION RECTALLY 2 TIMES DAILY.    [provider]  Magnesium 500 MG CAPS Take 500 mg by mouth daily.    [provider]  MAGNESIUM PO Take by mouth.    [provider]  Multiple Vitamins-Minerals (MULTIVITAMIN WITH MINERALS) tablet Take 1 tablet by mouth daily.    [provider]  omeprazole (PRILOSEC) 20 MG capsule TAKE 1 CAPSULE BY MOUTH 2 TIMES DAILY BEFORE A MEAL. 01/21/23   Nelwyn Salisbury, MD  polyethylene glycol (MIRALAX / GLYCOLAX)  17 g packet Use one packet daily Patient taking differently: Take 17 g by mouth 2 (two) times daily as needed for moderate constipation. 10/10/21   Nelwyn Salisbury, MD  potassium chloride (KLOR-CON) 10 MEQ tablet TAKE 1 TABLET (10 MEQ TOTAL) BY MOUTH DAILY. 01/21/23   Nelwyn Salisbury, MD  rosuvastatin (CRESTOR) 40 MG tablet TAKE 1 TABLET (40 MG TOTAL) BY MOUTH DAILY. 07/02/22   Nelwyn Salisbury, MD  triamterene-hydrochlorothiazide (DYAZIDE) 37.5-25 MG capsule Take 1 capsule by mouth every morning. 07/02/22   [provider]      Allergies    Sulfonamide derivatives, Flexeril [cyclobenzaprine], Hydromorphone, Morphine and codeine, and Sulfamethoxazole    Review of Systems   Review of Systems  Gastrointestinal:  Positive for vomiting.  Neurological:  Positive for headaches.    Physical Exam Updated Vital Signs BP 110/81 (BP Location: Right Arm)   Pulse 77   Temp 98.1 F (36.7 C) (Oral)   Resp 18   SpO2 95%  Physical Exam Vitals and nursing note reviewed.  Constitutional:      General: She is not in acute distress.    Appearance: She is well-developed. She is not toxic-appearing.  HENT:     Head: Normocephalic and atraumatic. No raccoon eyes or Battle's sign.     Comments: No hemotympanum, but does appear to be some effusion bilaterally, bilateral frontal and maxillary sinus tenderness Eyes:     Extraocular Movements: Extraocular movements intact.     Conjunctiva/sclera: Conjunctivae normal.     Pupils: Pupils are equal, round, and reactive to light.  Cardiovascular:     Rate and Rhythm: Normal rate and regular rhythm.     Heart sounds: No murmur heard. Pulmonary:     Effort: Pulmonary effort is normal. No respiratory distress.     Breath sounds: Normal breath sounds.  Abdominal:     Palpations: Abdomen is soft.     Tenderness: There is no abdominal tenderness.  Musculoskeletal:        General: No swelling.     Cervical back: Normal range of motion and neck supple. No  rigidity or tenderness.  Skin:    General: Skin is warm and dry.     Capillary Refill: Capillary refill takes less than 2 seconds.  Neurological:     General: No focal deficit present.     Mental Status: She is alert and oriented to person, place, and time. Mental status is at baseline.     Cranial Nerves: No cranial nerve deficit.     Sensory: No sensory deficit.     Motor: No weakness.     Coordination: Coordination normal.  Psychiatric:        Mood and Affect: Mood normal.     ED Results / Procedures / Treatments   Labs (all labs ordered  are listed, but only abnormal results are displayed) Labs Reviewed  BASIC METABOLIC PANEL - Abnormal; Notable for the following components:      Result Value   Potassium 3.3 (*)    Chloride 97 (*)    Creatinine, Ser 1.11 (*)    GFR, Estimated 54 (*)    All other components within normal limits  CBC  URINALYSIS, ROUTINE W REFLEX MICROSCOPIC    EKG None  Radiology DG Ribs Unilateral W/Chest Right Result Date: 06/02/2023 CLINICAL DATA:  Fall EXAM: RIGHT RIBS AND CHEST - 3+ VIEW COMPARISON:  04/30/2023 FINDINGS: No fracture or other bone lesions are seen involving the ribs. There is no evidence of pneumothorax or pleural effusion. Coarsened interstitial markings throughout both lungs with scattered reticulonodular opacities, more pronounced within the left lung. Heart size and mediastinal contours are within normal limits. Aortic atherosclerosis. IMPRESSION: 1. No evidence of right rib fracture. 2. Coarsened interstitial markings throughout both lungs with scattered reticulonodular opacities, more pronounced within the left lung. Findings may reflect atypical infection or pulmonary edema. Electronically Signed   By: Duanne Guess D.O.   On: 06/02/2023 16:00   CT Head Wo Contrast Result Date: 06/02/2023 CLINICAL DATA:  Head trauma, moderate-severe; fall. Diplopia and persistent headache. EXAM: CT HEAD WITHOUT CONTRAST CT CERVICAL SPINE  WITHOUT CONTRAST TECHNIQUE: Multidetector CT imaging of the head and cervical spine was performed following the standard protocol without intravenous contrast. Multiplanar CT image reconstructions of the cervical spine were also generated. RADIATION DOSE REDUCTION: This exam was performed according to the departmental dose-optimization program which includes automated exposure control, adjustment of the mA and/or kV according to patient size and/or use of iterative reconstruction technique. COMPARISON:  Head CT 06/29/2018. MRI brain 06/03/2022. CT cervical spine 08/09/2017. FINDINGS: CT HEAD FINDINGS Brain: No acute hemorrhage. Unchanged mild chronic small-vessel disease. Cortical gray-white differentiation is otherwise preserved. No hydrocephalus or extra-axial collection. No mass effect or midline shift. Vascular: No hyperdense vessel or unexpected calcification. Skull: No calvarial fracture or suspicious bone lesion. Skull base is unremarkable. Sinuses/Orbits: No acute finding. Other: None. CT CERVICAL SPINE FINDINGS Alignment: Normal. Skull base and vertebrae: Unchanged postoperative appearance from prior C5-6 ACDF. Hardware is intact. No associated lucency. Solid bony fusion across the intervening disc space. No acute fracture or suspicious bone lesion. Craniocervical junction is intact. Soft tissues and spinal canal: No prevertebral fluid or swelling. No visible canal hematoma. Disc levels: Adjacent segment degeneration at C6-7, where there is at least mild spinal canal stenosis and moderate bilateral neural foraminal narrowing. Upper chest: No acute findings. Other: Right-sided sublingual nerve stimulator. Atherosclerotic calcifications of the carotid bulbs. IMPRESSION: 1. No acute intracranial abnormality. 2. No acute fracture or traumatic listhesis of the cervical spine. 3. Unchanged postoperative appearance from prior C5-6 ACDF. Adjacent segment degeneration at C6-7, where there is at least mild spinal  canal stenosis and moderate bilateral neural foraminal narrowing. Electronically Signed   By: Orvan Falconer M.D.   On: 06/02/2023 15:39   CT Cervical Spine Wo Contrast Result Date: 06/02/2023 CLINICAL DATA:  Head trauma, moderate-severe; fall. Diplopia and persistent headache. EXAM: CT HEAD WITHOUT CONTRAST CT CERVICAL SPINE WITHOUT CONTRAST TECHNIQUE: Multidetector CT imaging of the head and cervical spine was performed following the standard protocol without intravenous contrast. Multiplanar CT image reconstructions of the cervical spine were also generated. RADIATION DOSE REDUCTION: This exam was performed according to the departmental dose-optimization program which includes automated exposure control, adjustment of the mA and/or kV according to patient size  and/or use of iterative reconstruction technique. COMPARISON:  Head CT 06/29/2018. MRI brain 06/03/2022. CT cervical spine 08/09/2017. FINDINGS: CT HEAD FINDINGS Brain: No acute hemorrhage. Unchanged mild chronic small-vessel disease. Cortical gray-white differentiation is otherwise preserved. No hydrocephalus or extra-axial collection. No mass effect or midline shift. Vascular: No hyperdense vessel or unexpected calcification. Skull: No calvarial fracture or suspicious bone lesion. Skull base is unremarkable. Sinuses/Orbits: No acute finding. Other: None. CT CERVICAL SPINE FINDINGS Alignment: Normal. Skull base and vertebrae: Unchanged postoperative appearance from prior C5-6 ACDF. Hardware is intact. No associated lucency. Solid bony fusion across the intervening disc space. No acute fracture or suspicious bone lesion. Craniocervical junction is intact. Soft tissues and spinal canal: No prevertebral fluid or swelling. No visible canal hematoma. Disc levels: Adjacent segment degeneration at C6-7, where there is at least mild spinal canal stenosis and moderate bilateral neural foraminal narrowing. Upper chest: No acute findings. Other: Right-sided  sublingual nerve stimulator. Atherosclerotic calcifications of the carotid bulbs. IMPRESSION: 1. No acute intracranial abnormality. 2. No acute fracture or traumatic listhesis of the cervical spine. 3. Unchanged postoperative appearance from prior C5-6 ACDF. Adjacent segment degeneration at C6-7, where there is at least mild spinal canal stenosis and moderate bilateral neural foraminal narrowing. Electronically Signed   By: Orvan Falconer M.D.   On: 06/02/2023 15:39    Procedures Procedures    Medications Ordered in ED Medications  sodium chloride 0.9 % bolus 1,000 mL (has no administration in time range)  ondansetron (ZOFRAN) injection 4 mg (has no administration in time range)    ED Course/ Medical Decision Making/ A&P                                 Medical Decision Making  This patient presents to the ED with chief complaint(s) of fall.  The complaint involves an extensive differential diagnosis and also carries with it a high risk of complications and morbidity.   pertinent past medical history as listed in HPI  The differential diagnosis includes  Intracranial hemorrhage, cervical fracture, rib fracture, pneumonia, UTI, sinus infection, vertigo, concussion The initial plan is to  Will obtain basic labs, CT head and neck, x-rays of right ribs Additional history obtained: Additional history obtained from family Records reviewed previous admission documents  Initial Assessment:   Patient is hemodynamically stable, afebrile, nontoxic-appearing presenting with 1 day onset of vomiting, vision changes, right rib pain, frontal headache following fall on 12/4.  Neuroexam is nonfocal, she is alert and oriented, acting at baseline per family.  Abdominal exam is nonfocal,  Independent ECG interpretation:  Pending    Independent labs interpretation:  The following labs were independently interpreted:  CBC unremarkable, BMP with very mild hypokalemia, slight bump in her  creatinine  Independent visualization and interpretation of imaging: I independently visualized the following imaging with scope of interpretation limited to determining acute life threatening conditions related to emergency care: CT head and neck, right ribs x-ray, which revealed no acute abnormality  Treatment and Reassessment: Patient given Zofran and liter bolus following for assessment.  Consultations obtained:   None at this time  Disposition:   Signout given to PA Ladona Ridgel, anticipate discharge home to her neurologist and ENT pending negative CT angio head and neck.  Social Determinants of Health:   none  This note was dictated with voice recognition software.  Despite best efforts at proofreading, errors may have occurred which can change the documentation  meaning.          Final Clinical Impression(s) / ED Diagnoses Final diagnoses:  Fall, initial encounter    Rx / DC Orders ED Discharge Orders     None         Fabienne Bruns 06/02/23 Izell Molino    Arby Barrette, MD 06/03/23 [phone]

## 2023-06-02 NOTE — ED Triage Notes (Signed)
She tells [place] that she "lost her balance and fell backward" 4 days ago. Here today with c/o diplopia and persistent h/a.

## 2023-06-04 DIAGNOSIS — J9611 Chronic respiratory failure with hypoxia: Secondary | ICD-10-CM | POA: Diagnosis not present

## 2023-06-17 ENCOUNTER — Other Ambulatory Visit: Payer: Self-pay | Admitting: Family Medicine

## 2023-06-17 ENCOUNTER — Telehealth: Payer: Self-pay

## 2023-06-17 NOTE — Progress Notes (Signed)
Transition Care Management Follow-up Telephone Call Date of discharge and from where: 06/02/2023 [facility] How have you been since you were released from the hospital? Patient stated she is feeling much better. No headaches. Any questions or concerns? No  Items Reviewed: Did the pt receive and understand the discharge instructions provided? Yes  Medications obtained and verified?  No medication prescribed this visit. Other? No  Any new allergies since your discharge? No  Dietary orders reviewed? Yes Do you have support at home? Yes   Follow up appointments reviewed:  PCP Hospital f/u appt confirmed? No  Scheduled to see  on  @ . Specialist Hospital f/u appt confirmed? No  Scheduled to see  on  @ . Are transportation arrangements needed? No  If their condition worsens, is the pt aware to call PCP or go to the Emergency Dept.? Yes Was the patient provided with contact information for the PCP's office or ED? Yes Was to pt encouraged to call back with questions or concerns? Yes   Yehudah Standing [facility]  [facility] [place], [facility] Guide Direct Dial: [phone]  Website: [email or web address]

## 2023-06-18 DIAGNOSIS — F3132 Bipolar disorder, current episode depressed, moderate: Secondary | ICD-10-CM | POA: Diagnosis not present

## 2023-06-18 DIAGNOSIS — F603 Borderline personality disorder: Secondary | ICD-10-CM | POA: Diagnosis not present

## 2023-07-02 ENCOUNTER — Ambulatory Visit (HOSPITAL_BASED_OUTPATIENT_CLINIC_OR_DEPARTMENT_OTHER): Payer: PPO | Admitting: Pulmonary Disease

## 2023-07-02 DIAGNOSIS — R42 Dizziness and giddiness: Secondary | ICD-10-CM | POA: Diagnosis not present

## 2023-07-19 DIAGNOSIS — F603 Borderline personality disorder: Secondary | ICD-10-CM | POA: Diagnosis not present

## 2023-07-19 DIAGNOSIS — F3132 Bipolar disorder, current episode depressed, moderate: Secondary | ICD-10-CM | POA: Diagnosis not present

## 2023-07-22 ENCOUNTER — Other Ambulatory Visit: Payer: Self-pay | Admitting: Family Medicine

## 2023-07-23 ENCOUNTER — Other Ambulatory Visit: Payer: Self-pay | Admitting: Family Medicine

## 2023-07-23 NOTE — Telephone Encounter (Signed)
 Copied from CRM [id number]. Topic: Clinical - Medication Refill >> Jul 23, 2023  1:36 PM [name] wrote: Most Recent Primary Care Visit:  Provider: [facility]  Department: [facility]  Visit Type: LAB  Date: 10/26/2022  Medication: PROZAC   Has the patient contacted their pharmacy? Yes (Agent: If no, request that the patient contact the pharmacy for the refill. If patient does not wish to contact the pharmacy document the reason why and proceed with request.) (Agent: If yes, when and what did the pharmacy advise?)  Is this the correct pharmacy for this prescription? Yes If no, delete pharmacy and type the correct one.  This is the patient's preferred pharmacy:  [facility] - [place], [place] - [address] [place] [place] [place] [address] Phone: [phone] Fax: [phone]   Has the prescription been filled recently? Yes  Is the patient out of the medication? Yes  Has the patient been seen for an appointment in the last year OR does the patient have an upcoming appointment? Yes  Can we respond through MyChart? Yes  Agent: Please be advised that Rx refills may take up to 3 business days. We ask that you follow-up with your pharmacy.

## 2023-07-23 NOTE — Telephone Encounter (Signed)
 Patient has 1 refill remaining, per chart. This RN made an outgoing call to the patient. Patient stated that she already called her pharmacy and does not have any refills left.   Copied from CRM [id number]. Topic: Clinical - Medication Refill >> Jul 23, 2023  1:36 PM [name] wrote: Most Recent Primary Care Visit:  Provider: [facility]  Department: [facility]  Visit Type: LAB  Date: 10/26/2022  Medication: PROZAC   Has the patient contacted their pharmacy? Yes (Agent: If no, request that the patient contact the pharmacy for the refill. If patient does not wish to contact the pharmacy document the reason why and proceed with request.) (Agent: If yes, when and what did the pharmacy advise?)  Is this the correct pharmacy for this prescription? Yes If no, delete pharmacy and type the correct one.  This is the patient's preferred pharmacy:  [facility] - [place], [place] - [address] [place] [address] Phone: [phone] Fax: [phone]   Has the prescription been filled recently? Yes  Is the patient out of the medication? Yes  Has the patient been seen for an appointment in the last year OR does the patient have an upcoming appointment? Yes  Can we respond through MyChart? Yes  Agent: Please be advised that Rx refills may take up to 3 business days. We ask that you follow-up with your pharmacy.

## 2023-07-23 NOTE — Telephone Encounter (Signed)
Last Fill: 02/06/23  Last OV: Unknown Next OV: 08/28/23 AWV  Routing to provider for review/authorization.

## 2023-08-12 ENCOUNTER — Other Ambulatory Visit (HOSPITAL_BASED_OUTPATIENT_CLINIC_OR_DEPARTMENT_OTHER): Payer: Self-pay | Admitting: Pulmonary Disease

## 2023-08-13 ENCOUNTER — Telehealth (HOSPITAL_BASED_OUTPATIENT_CLINIC_OR_DEPARTMENT_OTHER): Payer: Self-pay | Admitting: Pulmonary Disease

## 2023-08-13 ENCOUNTER — Other Ambulatory Visit: Payer: Self-pay | Admitting: Family Medicine

## 2023-08-13 NOTE — Telephone Encounter (Signed)
 Last Fill: 02/13/23 90 tabs/5 RF  Last OV: Unknown Next OV: 08/16/23  Routing to provider for review/authorization.

## 2023-08-13 NOTE — Telephone Encounter (Unsigned)
 Copied from CRM [id number]. Topic: Clinical - Medication Refill >> Aug 13, 2023  4:49 PM Drema Balzarine wrote: Most Recent Primary Care Visit:  Provider: [facility]  Department: [facility]  Visit Type: LAB  Date: 10/26/2022  Medication: alprazolam Prudy Feeler) 2 MG tablet   Has the patient contacted their pharmacy? Yes, pharmacy told patient she called too late  (Agent: If no, request that the patient contact the pharmacy for the refill. If patient does not wish to contact the pharmacy document the reason why and proceed with request.) (Agent: If yes, when and what did the pharmacy advise?)  Is this the correct pharmacy for this prescription? Yes If no, delete pharmacy and type the correct one.  This is the patient's preferred pharmacy:   [facility] - [place], [place] - [address] [place] [address] Phone: [phone] Fax: [phone]   Has the prescription been filled recently? Yes  Is the patient out of the medication? Yes  Has the patient been seen for an appointment in the last year OR does the patient have an upcoming appointment? Yes, I scheduled her an appointment for Friday, 2/28  Can we respond through MyChart? Yes  Agent: Please be advised that Rx refills may take up to 3 business days. We ask that you follow-up with your pharmacy.

## 2023-08-14 ENCOUNTER — Encounter (HOSPITAL_BASED_OUTPATIENT_CLINIC_OR_DEPARTMENT_OTHER): Payer: Self-pay | Admitting: Pulmonary Disease

## 2023-08-14 ENCOUNTER — Other Ambulatory Visit (HOSPITAL_BASED_OUTPATIENT_CLINIC_OR_DEPARTMENT_OTHER): Payer: Self-pay

## 2023-08-14 ENCOUNTER — Encounter: Payer: Self-pay | Admitting: Family Medicine

## 2023-08-14 ENCOUNTER — Other Ambulatory Visit: Payer: Self-pay | Admitting: Family Medicine

## 2023-08-14 MED ORDER — ALBUTEROL SULFATE HFA 108 (90 BASE) MCG/ACT IN AERS
1.0000 | INHALATION_SPRAY | Freq: Four times a day (QID) | RESPIRATORY_TRACT | 2 refills | Status: DC | PRN
Start: 2023-08-14 — End: 2024-03-12

## 2023-08-14 MED ORDER — TRELEGY ELLIPTA 100-62.5-25 MCG/ACT IN AEPB: 1.0000 | INHALATION_SPRAY | Freq: Every day | RESPIRATORY_TRACT | 5 refills | Status: AC

## 2023-08-14 MED ORDER — ALBUTEROL SULFATE (2.5 MG/3ML) 0.083% IN NEBU: 2.5000 mg | INHALATION_SOLUTION | Freq: Four times a day (QID) | RESPIRATORY_TRACT | 12 refills | Status: AC | PRN

## 2023-08-14 NOTE — Telephone Encounter (Signed)
 Patient has called the pharmacy multiple times. Breathing is becoming shallow. Patient requesting if we can just get Albuterol filled ASAP.  Please advise

## 2023-08-14 NOTE — Telephone Encounter (Signed)
 Rx's sent to pharmacy.

## 2023-08-14 NOTE — Telephone Encounter (Unsigned)
 Copied from CRM [id number]. Topic: Clinical - Medication Refill >> Aug 14, 2023  1:42 PM Almira Coaster wrote: Most Recent Primary Care Visit:  Provider: [facility]  Department: [facility]  Visit Type: LAB  Date: 10/26/2022  Medication: alprazolam Prudy Feeler) 2 MG tablet  Has the patient contacted their pharmacy? Yes, pharmacy advised that refills had expired (Agent: If no, request that the patient contact the pharmacy for the refill. If patient does not wish to contact the pharmacy document the reason why and proceed with request.) (Agent: If yes, when and what did the pharmacy advise?)  Is this the correct pharmacy for this prescription? Yes If no, delete pharmacy and type the correct one.  This is the patient's preferred pharmacy:  [facility] - [place], [place] - [address] [place] [address] Phone: [phone] Fax: [phone]   Has the prescription been filled recently? No  Is the patient out of the medication? Yes  Has the patient been seen for an appointment in the last year OR does the patient have an upcoming appointment? Yes  Can we respond through MyChart? No, patient prefers a phone call  Agent: Please be advised that Rx refills may take up to 3 business days. We ask that you follow-up with your pharmacy.

## 2023-08-15 MED ORDER — ALPRAZOLAM 2 MG PO TABS: 2.0000 mg | ORAL_TABLET | Freq: Three times a day (TID) | ORAL | 5 refills | Status: DC | PRN

## 2023-08-16 ENCOUNTER — Ambulatory Visit (INDEPENDENT_AMBULATORY_CARE_PROVIDER_SITE_OTHER): Payer: PPO | Admitting: Family Medicine

## 2023-08-16 ENCOUNTER — Encounter: Payer: Self-pay | Admitting: Family Medicine

## 2023-08-16 VITALS — BP 110/74 | HR 91 | Temp 98.3°F | Wt 190.0 lb

## 2023-08-16 DIAGNOSIS — G8929 Other chronic pain: Secondary | ICD-10-CM

## 2023-08-16 DIAGNOSIS — M5440 Lumbago with sciatica, unspecified side: Secondary | ICD-10-CM

## 2023-08-16 DIAGNOSIS — F1190 Opioid use, unspecified, uncomplicated: Secondary | ICD-10-CM

## 2023-08-16 MED ORDER — OXYCODONE HCL 10 MG PO TABS: 10.0000 mg | ORAL_TABLET | Freq: Four times a day (QID) | ORAL | 0 refills | Status: DC | PRN

## 2023-08-16 MED ORDER — FLUOXETINE HCL 10 MG PO CAPS: 10.0000 mg | ORAL_CAPSULE | Freq: Every day | ORAL | 0 refills | Status: DC

## 2023-08-16 MED ORDER — FLUOXETINE HCL 20 MG PO CAPS: 20.0000 mg | ORAL_CAPSULE | Freq: Every day | ORAL | 0 refills | Status: DC

## 2023-08-16 NOTE — Progress Notes (Signed)
   Subjective:    Patient ID: [name], female    DOB: 11/11/1955, 68 y.o.   MRN: [id number]  HPI Here for pain management. Her pain is fairly well controlled. She asks about getting of Prozac because she thinks this contributes to her weight gain.    Review of Systems  Constitutional: Negative.   Musculoskeletal:  Positive for back pain.       Objective:   Physical Exam Constitutional:      Appearance: Normal appearance. She is obese.  Cardiovascular:     Rate and Rhythm: Normal rate and regular rhythm.     Pulses: Normal pulses.     Heart sounds: Normal heart sounds.  Pulmonary:     Effort: Pulmonary effort is normal.     Breath sounds: Normal breath sounds.           Assessment & Plan:  Pain management. Indication for chronic opioid: low back pain Medication and dose: Oxycodone 10 mg  # pills per month: 120 Last UDS date: 08-16-23 Opioid Treatment Agreement signed (Y/N): 10-19-22 Opioid Treatment Agreement last reviewed with patient:  08-16-23 [facility] reviewed this encounter (include red flags): Yes Meds were refilled. She will taper off Prozac by taking 20 mg a day for 2 weeks, then taking 10 mg a day for 2 weeks, and then stopping it.  Gershon Crane, MD

## 2023-08-17 ENCOUNTER — Encounter: Payer: Self-pay | Admitting: Family Medicine

## 2023-08-18 LAB — DRUG MONITOR, PANEL 1, W/CONF, URINE
Alphahydroxyalprazolam: 249 ng/mL — ABNORMAL HIGH (ref <25)
Alphahydroxymidazolam: NEGATIVE ng/mL (ref <50)
Alphahydroxytriazolam: NEGATIVE ng/mL (ref <50)
Aminoclonazepam: NEGATIVE ng/mL (ref <25)
Amphetamines: NEGATIVE ng/mL (ref <500)
Barbiturates: NEGATIVE ng/mL (ref <300)
Benzodiazepines: POSITIVE ng/mL — AB (ref <100)
Cocaine Metabolite: NEGATIVE ng/mL (ref <150)
Creatinine: 21.9 mg/dL (ref >=20.0)
Hydroxyethylflurazepam: NEGATIVE ng/mL (ref <50)
Lorazepam: NEGATIVE ng/mL (ref <50)
Marijuana Metabolite: NEGATIVE ng/mL (ref <20)
Methadone Metabolite: NEGATIVE ng/mL (ref <100)
Nordiazepam: NEGATIVE ng/mL (ref <50)
Opiates: NEGATIVE ng/mL (ref <100)
Oxazepam: NEGATIVE ng/mL (ref <50)
Oxidant: NEGATIVE ug/mL (ref <200)
Oxycodone: NEGATIVE ng/mL (ref <100)
Phencyclidine: NEGATIVE ng/mL (ref <25)
Temazepam: NEGATIVE ng/mL (ref <50)
pH: 6.4 (ref 4.5–9.0)

## 2023-08-18 LAB — DM TEMPLATE

## 2023-08-19 ENCOUNTER — Encounter: Payer: Self-pay | Admitting: Family Medicine

## 2023-08-20 ENCOUNTER — Telehealth: Payer: Self-pay | Admitting: Family Medicine

## 2023-08-20 NOTE — Telephone Encounter (Signed)
 Please see note below  Copied from CRM [id number]. Topic: Clinical - Prescription Issue >> Aug 20, 2023  4:57 PM Florestine Avers wrote: Reason for CRM: Patient is calling because she states that she is still waiting for a prior authorization to be done for her oxycodone.

## 2023-08-21 ENCOUNTER — Telehealth: Payer: Self-pay

## 2023-08-21 ENCOUNTER — Other Ambulatory Visit (HOSPITAL_COMMUNITY): Payer: Self-pay

## 2023-08-21 NOTE — Telephone Encounter (Signed)
 FYI  This has already been approved

## 2023-08-21 NOTE — Telephone Encounter (Signed)
 Pharmacy Patient Advocate Encounter   Received notification from Pt Calls Messages that prior authorization for OXYCODONE 10MG  is required/requested.   Insurance verification completed.   The patient is insured through [facility] ADVANTAGE/RX ADVANCE .   Per test claim: Refill too soon. PA is not needed at this time. Medication was filled 08/21/23. Next eligible fill date is 08/27/23.

## 2023-08-21 NOTE — Telephone Encounter (Signed)
 Per test claim: Refill too soon. PA is not needed at this time. Medication was filled 08/21/23. Next eligible fill date is 08/27/23.

## 2023-08-21 NOTE — Telephone Encounter (Signed)
 Please start a PA for pt Oxycodone HCl 10 MG TABS

## 2023-08-22 NOTE — Telephone Encounter (Signed)
 Noted.

## 2023-08-23 ENCOUNTER — Telehealth: Payer: Self-pay | Admitting: Pulmonary Disease

## 2023-08-23 DIAGNOSIS — J9611 Chronic respiratory failure with hypoxia: Secondary | ICD-10-CM | POA: Diagnosis not present

## 2023-08-23 NOTE — Telephone Encounter (Signed)
 Patient's ex-husband, John, called (okay per DPR) and patient is needing oxygen again. Needs order sent to [facility]. States patient cannot wait until her appointment on 3/27 and its very hard for her to get transportation for an appointment. Please advise.

## 2023-08-23 NOTE — Telephone Encounter (Signed)
 Spoke with [name] [name] at [facility] and pt had them come get equipment in late Dec 2024, but since they still have authorization until June they are going to deliver oxygen today for patient. She has been notified by [employer]

## 2023-08-28 ENCOUNTER — Ambulatory Visit: Payer: PPO

## 2023-08-28 VITALS — Ht 62.0 in | Wt 190.0 lb

## 2023-08-28 DIAGNOSIS — Z1231 Encounter for screening mammogram for malignant neoplasm of breast: Secondary | ICD-10-CM

## 2023-08-28 DIAGNOSIS — Z13820 Encounter for screening for osteoporosis: Secondary | ICD-10-CM | POA: Diagnosis not present

## 2023-08-28 DIAGNOSIS — Z0000 Encounter for general adult medical examination without abnormal findings: Secondary | ICD-10-CM | POA: Diagnosis not present

## 2023-08-28 NOTE — Patient Instructions (Addendum)
 [name] [name] , Thank you for taking time to come for your Medicare Wellness Visit. I appreciate your ongoing commitment to your health goals. Please review the following plan we discussed and let me know if I can assist you in the future.   Referrals/Orders/Follow-Ups/Clinician Recommendations:   This is a list of the screening recommended for you and due dates:  Health Maintenance  Topic Date Due   Mammogram  03/26/2020   DEXA scan (bone density measurement)  06/14/2020   Flu Shot  01/17/2023   COVID-19 Vaccine (1 - 2024-25 season) Never done   Pneumonia Vaccine (3 of 3 - PPSV23 or PCV20) 03/08/2023   DTaP/Tdap/Td vaccine (2 - Td or Tdap) 04/21/2023   Medicare Annual Wellness Visit  08/27/2024   Colon Cancer Screening  05/20/2031   Hepatitis C Screening  Completed   Zoster (Shingles) Vaccine  Completed   HPV Vaccine  Aged Out   Opioid Pain Medicine Management Opioids are powerful medicines that are used to treat moderate to severe pain. When used for short periods of time, they can help you to: Sleep better. Do better in physical or occupational therapy. Feel better in the first few days after an injury. Recover from surgery. Opioids should be taken with the supervision of a trained health care provider. They should be taken for the shortest period of time possible. This is because opioids can be addictive, and the longer you take opioids, the greater your risk of addiction. This addiction can also be called opioid use disorder. What are the risks? Using opioid pain medicines for longer than 3 days increases your risk of side effects. Side effects include: Constipation. Nausea and vomiting. Breathing difficulties (respiratory depression). Drowsiness. Confusion. Opioid use disorder. Itching. Taking opioid pain medicine for a long period of time can affect your ability to do daily tasks. It also puts you at risk for: Motor vehicle crashes. Depression. Suicide. Heart  attack. Overdose, which can be life-threatening. What is a pain treatment plan? A pain treatment plan is an agreement between you and your health care provider. Pain is unique to each person, and treatments vary depending on your condition. To manage your pain, you and your health care provider need to work together. To help you do this: Discuss the goals of your treatment, including how much pain you might expect to have and how you will manage the pain. Review the risks and benefits of taking opioid medicines. Remember that a good treatment plan uses more than one approach and minimizes the chance of side effects. Be honest about the amount of medicines you take and about any drug or alcohol use. Get pain medicine prescriptions from only one health care provider. Pain can be managed with many types of alternative treatments. Ask your health care provider to refer you to one or more specialists who can help you manage pain through: Physical or occupational therapy. Counseling (cognitive behavioral therapy). Good nutrition. Biofeedback. Massage. Meditation. Non-opioid medicine. Following a gentle exercise program. How to use opioid pain medicine Taking medicine Take your pain medicine exactly as told by your health care provider. Take it only when you need it. If your pain gets less severe, you may take less than your prescribed dose if your health care provider approves. If you are not having pain, do nottake pain medicine unless your health care provider tells you to take it. If your pain is severe, do nottry to treat it yourself by taking more pills than instructed on your prescription.  Contact your health care provider for help. Write down the times when you take your pain medicine. It is easy to become confused while on pain medicine. Writing the time can help you avoid overdose. Take other over-the-counter or prescription medicines only as told by your health care provider. Keeping  yourself and others safe  While you are taking opioid pain medicine: Do not drive, use machinery, or power tools. Do not sign legal documents. Do not drink alcohol. Do not take sleeping pills. Do not supervise children by yourself. Do not do activities that require climbing or being in high places. Do not go to a lake, river, ocean, spa, or swimming pool. Do not share your pain medicine with anyone. Keep pain medicine in a locked cabinet or in a secure area where pets and children cannot reach it. Stopping your use of opioids If you have been taking opioid medicine for more than a few weeks, you may need to slowly decrease (taper) how much you take until you stop completely. Tapering your use of opioids can decrease your risk of symptoms of withdrawal, such as: Pain and cramping in the abdomen. Nausea. Sweating. Sleepiness. Restlessness. Uncontrollable shaking (tremors). Cravings for the medicine. Do not attempt to taper your use of opioids on your own. Talk with your health care provider about how to do this. Your health care provider may prescribe a step-down schedule based on how much medicine you are taking and how long you have been taking it. Getting rid of leftover pills Do not save any leftover pills. Get rid of leftover pills safely by: Taking the medicine to a prescription take-back program. This is usually offered by the county or law enforcement. Bringing them to a pharmacy that has a drug disposal container. Flushing them down the toilet. Check the label or package insert of your medicine to see whether this is safe to do. Throwing them out in the trash. Check the label or package insert of your medicine to see whether this is safe to do. If it is safe to throw it out, remove the medicine from the original container, put it into a sealable bag or container, and mix it with used coffee grounds, food scraps, dirt, or cat litter before putting it in the trash. Follow these  instructions at home: Activity Do exercises as told by your health care provider. Avoid activities that make your pain worse. Return to your normal activities as told by your health care provider. Ask your health care provider what activities are safe for you. General instructions You may need to take these actions to prevent or treat constipation: Drink enough fluid to keep your urine pale yellow. Take over-the-counter or prescription medicines. Eat foods that are high in fiber, such as beans, whole grains, and fresh fruits and vegetables. Limit foods that are high in fat and processed sugars, such as fried or sweet foods. Keep all follow-up visits. This is important. Where to find support If you have been taking opioids for a long time, you may benefit from receiving support for quitting from a local support group or counselor. Ask your health care provider for a referral to these resources in your area. Where to find more information Centers for Disease Control and Prevention (CDC): FootballExhibition.com.br [place] Food and Drug Administration (FDA): PumpkinSearch.com.ee Get help right away if: You may have taken too much of an opioid (overdosed). Common symptoms of an overdose: Your breathing is slower or more shallow than normal. You have a very slow  heartbeat (pulse). You have slurred speech. You have nausea and vomiting. Your pupils become very small. You have other potential symptoms: You are very confused. You faint or feel like you will faint. You have cold, clammy skin. You have blue lips or fingernails. You have thoughts of harming yourself or harming others. These symptoms may represent a serious problem that is an emergency. Do not wait to see if the symptoms will go away. Get medical help right away. Call your local emergency services (911 in the [place] Do not drive yourself to the hospital.  If you ever feel like you may hurt yourself or others, or have thoughts about taking your own life, get  help right away. Go to your nearest emergency department or: Call your local emergency services (911 in the [place] Call the [facility] ((941) 114-9424 in the [place] Call a suicide crisis helpline, such as the National Suicide Prevention Lifeline at 9793669872 or 988 in the [place] This is open 24 hours a day in the [place] If you're a Veteran: Call 988 and press 1. This is open 24 hours a day. Text the [employer] at 337 634 4957. Summary Opioid medicines can help you manage moderate to severe pain for a short period of time. A pain treatment plan is an agreement between you and your health care provider. Discuss the goals of your treatment, including how much pain you might expect to have and how you will manage the pain. If you think that you or someone else may have taken too much of an opioid, get medical help right away. This information is not intended to replace advice given to you by your health care provider. Make sure you discuss any questions you have with your health care provider. Document Revised: 03/11/2023 Document Reviewed: 09/14/2020 Elsevier Patient Education  2024 [employer]. Advanced directives: (Copy Requested) Please bring a copy of your health care power of attorney and living will to the office to be added to your chart at your convenience. You can mail to [facility] [address] [place], [place] [address] or email to [email or web address]  Next Medicare Annual Wellness Visit scheduled for next year: Yes

## 2023-08-28 NOTE — Progress Notes (Signed)
 Subjective:   [name] [name] is a 68 y.o. female who presents for Medicare Annual (Subsequent) preventive examination.  Visit Complete: Virtual I connected with  [name] on 08/28/23 by a audio enabled telemedicine application and verified that I am speaking with the correct person using two identifiers.  Patient Location: Home  Provider Location: Home Office  I discussed the limitations of evaluation and management by telemedicine. The patient expressed understanding and agreed to proceed.  Vital Signs: Because this visit was a virtual/telehealth visit, some criteria may be missing or patient reported. Any vitals not documented were not able to be obtained and vitals that have been documented are patient reported.    Cardiac Risk Factors include: advanced age (>75men, >28 women)     Objective:    Today's Vitals   08/28/23 0927  Weight: 190 lb (86.2 kg)  Height: 5\' 2"  (1.575 m)   Body mass index is 34.75 kg/m.     08/28/2023    9:42 AM 06/02/2023    2:27 PM 01/21/2023    7:40 AM 07/30/2022    3:39 PM 12/05/2021    4:53 PM 11/30/2021    9:41 AM 08/16/2021    8:55 AM  Advanced Directives  Does Patient Have a Medical Advance Directive? Yes Yes No;Yes Yes No No Yes  Type of Estate agent of [place];Living will Healthcare Power of [employer] Power of [employer] Power of [place];Living will   Healthcare Power of [place];Living will  Copy of Healthcare Power of Attorney in Chart? No - copy requested No - copy requested No - copy requested No - copy requested     Would patient like information on creating a medical advance directive?     No - Patient declined      Current Medications (verified) Outpatient Encounter Medications as of 08/28/2023  Medication Sig   acyclovir (ZOVIRAX) 400 MG tablet TAKE 1 TABLET BY MOUTH 4 TIMES A DAY AS NEEDED   albuterol (PROVENTIL) (2.5 MG/3ML) 0.083% nebulizer solution Take 3 mLs (2.5 mg  total) by nebulization every 6 (six) hours as needed for wheezing or shortness of breath.   albuterol (VENTOLIN HFA) 108 (90 Base) MCG/ACT inhaler Inhale 1-2 puffs into the lungs every 6 (six) hours as needed.   alprazolam (XANAX) 2 MG tablet Take 1 tablet (2 mg total) by mouth 3 (three) times daily as needed for anxiety.   Ascorbic Acid (VITAMIN C PO) Take by mouth.   aspirin EC 81 MG tablet Take 81 mg by mouth daily. Swallow whole.   Aspirin-Acetaminophen-Caffeine (GOODYS EXTRA STRENGTH PO) Take by mouth.   B Complex-C-Folic Acid (B-COMPLEX/VITAMIN C PO) Take 1 tablet by mouth daily.   Cholecalciferol (VITAMIN D3) 125 MCG (5000 UT) CAPS Take 5,000 Units by mouth once a week.   FLUoxetine (PROZAC) 10 MG capsule Take 1 capsule (10 mg total) by mouth daily.   FLUoxetine (PROZAC) 20 MG capsule Take 1 capsule (20 mg total) by mouth daily.   Fluticasone-Umeclidin-Vilant (TRELEGY ELLIPTA) 100-62.5-25 MCG/ACT AEPB Inhale 1 puff into the lungs daily.   hydrocortisone 2.5 % cream PLACE 1 APPLICATION RECTALLY 2 TIMES DAILY.   Magnesium 500 MG CAPS Take 500 mg by mouth daily.   MAGNESIUM PO Take by mouth.   Multiple Vitamins-Minerals (MULTIVITAMIN WITH MINERALS) tablet Take 1 tablet by mouth daily.   omeprazole (PRILOSEC) 20 MG capsule TAKE 1 CAPSULE BY MOUTH 2 TIMES DAILY BEFORE A MEAL.   Oxycodone HCl 10 MG TABS Take  1 tablet (10 mg total) by mouth every 6 (six) hours as needed (pain).   Oxycodone HCl 10 MG TABS Take 1 tablet (10 mg total) by mouth every 6 (six) hours as needed (pain).   Oxycodone HCl 10 MG TABS Take 1 tablet (10 mg total) by mouth every 6 (six) hours as needed (pain).   polyethylene glycol (MIRALAX / GLYCOLAX) 17 g packet Use one packet daily (Patient taking differently: Take 17 g by mouth 2 (two) times daily as needed for moderate constipation.)   potassium chloride (KLOR-CON) 10 MEQ tablet TAKE 1 TABLET (10 MEQ TOTAL) BY MOUTH DAILY.   rosuvastatin (CRESTOR) 40 MG tablet TAKE 1  TABLET BY MOUTH DAILY.   triamterene-hydrochlorothiazide (DYAZIDE) 37.5-25 MG capsule Take 1 capsule by mouth every morning.   No facility-administered encounter medications on file as of 08/28/2023.    Allergies (verified) Sulfonamide derivatives, Flexeril [cyclobenzaprine], Hydromorphone, Morphine and codeine, and Sulfamethoxazole   History: Past Medical History:  Diagnosis Date   Allergic rhinitis 01/23/2008   Angina pectoris 06/06/2016   Arthritis    Attention deficit hyperactivity disorder (ADHD) 02/07/2016   Back pain, lumbar 01/21/2009   Bell's palsy 02/06/2008   Bipolar disorder 12/13/2009   Chronic idiopathic constipation    Dizziness 04/16/2008   Edema, leg 01/21/2009   Epigastric pain    Essential hypertension 01/23/2008   Gastro-esophageal reflux disease without esophagitis 01/23/2008   Generalized anxiety disorder 01/23/2008   Headache 04/15/2007   Hidradenitis 10/04/2008   History of colonic polyps    Hyperlipidemia    Hypokalemia 07/23/2018   Insomnia 01/23/2008   Major depressive disorder 01/23/2008   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   Neck pain 05/06/2017   Obesity (BMI 30-39.9) 11/10/2015   OSA (obstructive sleep apnea) 04/15/2007   inspire implant; does not always remember to turn device on   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/2008   Qualifier: Diagnosis of  By: Clent Ridges MD, Jeannett Senior A    PUD (peptic ulcer disease) 06/06/2016   Unspecified urinary incontinence 01/23/2008   Wears dentures    Wears glasses    Past Surgical History:  Procedure Laterality Date   ABDOMINAL HYSTERECTOMY     ANTERIOR FUSION CERVICAL SPINE  2006   Dr. Wynetta Emery   BREAST BIOPSY Right 1981   benign   BREAST EXCISIONAL BIOPSY Right 1981   benign   scar does not show   BREAST SURGERY Bilateral 12/11/2012   reductions per Dr. Ivin Booty in [facility]    CATARACT EXTRACTION, BILATERAL Bilateral last summer of 2018   at [facility]   COLONOSCOPY  03/07/2021   per Dr. Loreta Ave, adenomatous polyps,  repeat in 7 yrs   DRUG INDUCED ENDOSCOPY N/A 01/30/2019   Procedure: DRUG INDUCED ENDOSCOPY;  Surgeon: Christia Reading, MD;  Location: [facility];  Service: ENT;  Laterality: N/A;   ESOPHAGOGASTRODUODENOSCOPY  04/23/2011   per Dr. Loreta Ave, normal    EYE SURGERY  06/18/2016   L eyelid    IMPLANTATION OF HYPOGLOSSAL NERVE STIMULATOR Right 03/25/2019   Procedure: IMPLANTATION OF HYPOGLOSSAL NERVE STIMULATOR;  Surgeon: Christia Reading, MD;  Location: [facility] OR;  Service: ENT;  Laterality: Right;   LEFT HEART CATH AND CORONARY ANGIOGRAPHY N/A 07/05/2022   Procedure: LEFT HEART CATH AND CORONARY ANGIOGRAPHY;  Surgeon: Swaziland, Peter M, MD;  Location: [facility] INVASIVE CV LAB;  Service: Cardiovascular;  Laterality: N/A;   LUMBAR DISC SURGERY  2003   Dr. Wynetta Emery   MULTIPLE TOOTH EXTRACTIONS     ORIF WRIST FRACTURE Right  08/29/2018   REDUCTION MAMMAPLASTY Bilateral 2013   SPLIT NIGHT STUDY  11/11/2015   UVULOPALATOPHARYNGOPLASTY     VAGINAL HYSTERECTOMY  05/1995   secondary to fibroids.   Family History  Problem Relation Age of Onset   Dementia Mother    Hyperlipidemia Mother    Asthma Father    Hypertension Sister    Hyperlipidemia Sister    Coronary artery disease Sister    Thyroid disease Sister    Hypertension Sister    Hyperlipidemia Sister    Hypertension Brother    Hyperlipidemia Brother    Coronary artery disease Brother    Alcohol abuse Other        fhx   Arthritis Other        fhx   Coronary artery disease Other        fhx   Depression Other        fhx   Diabetes Other        fhx   Hyperlipidemia Other        fhx   Hypertension Other        fhx   Sudden death Other        fhx   Social History   Socioeconomic History   Marital status: Married    Spouse name: Not on file   Number of children: 2   Years of education: 14   Highest education level: Associate degree: academic program  Occupational History   Occupation: Disabled  Tobacco Use   Smoking status: Former     Current packs/day: 0.00    Types: Cigarettes    Start date: 12/1990    Quit date: 12/2020    Years since quitting: 2.6    Passive exposure: Past (occasional)   Smokeless tobacco: Never  Vaping Use   Vaping status: Former  Substance and Sexual Activity   Alcohol use: Not Currently   Drug use: No   Sexual activity: Not Currently    Partners: Male    Birth control/protection: Surgical  Other Topics Concern   Not on file  Social History Narrative   Pt lives in [place], [place].   Right handed    Occasional use of caffeine   Lives with ex-husband         07/10/2018: Lives with ex-husband on one-level house. Divorced, according to husband, for financial benefit only    Has two children, one local and one in [place]. Has several grandchildren locally who can help her occasionally if needed.   Social Drivers of Corporate investment banker Strain: Low Risk  (08/28/2023)   Overall Financial Resource Strain (CARDIA)    Difficulty of Paying Living Expenses: Not hard at all  Food Insecurity: No Food Insecurity (08/28/2023)   Hunger Vital Sign    Worried About Running Out of Food in the Last Year: Never true    Ran Out of Food in the Last Year: Never true  Transportation Needs: No Transportation Needs (08/28/2023)   PRAPARE - Administrator, Civil Service (Medical): No    Lack of Transportation (Non-Medical): No  Physical Activity: Insufficiently Active (07/30/2022)   Exercise Vital Sign    Days of Exercise per Week: 3 days    Minutes of Exercise per Session: 30 min  Stress: No Stress Concern Present (08/28/2023)   [employer] of Occupational Health - Occupational Stress Questionnaire    Feeling of Stress : Not at all  Social Connections: Socially Integrated (08/28/2023)   Social Connection and  Isolation Panel [NHANES]    Frequency of Communication with Friends and Family: More than three times a week    Frequency of Social Gatherings with Friends and Family: More than three  times a week    Attends Religious Services: More than 4 times per year    Active Member of [name] or Organizations: Yes    Attends Engineer, structural: More than 4 times per year    Marital Status: Married    Tobacco Counseling Counseling given: Not Answered   Clinical Intake:  Pre-visit preparation completed: Yes  Pain : No/denies pain     BMI - recorded: 34.75 Nutritional Status: BMI > 30  Obese Nutritional Risks: None Diabetes: No  How often do you need to have someone help you when you read instructions, pamphlets, or other written materials from your doctor or pharmacy?: 1 - Never  Interpreter Needed?: No  Information entered by :: Theresa Mulligan LPN   Activities of Daily Living    08/28/2023    9:38 AM  In your present state of health, do you have any difficulty performing the following activities:  Hearing? 0  Vision? 0  Difficulty concentrating or making decisions? 0  Walking or climbing stairs? 1  Comment Uses a Estate manager/land agent or bathing? 0  Doing errands, shopping? 0  Preparing Food and eating ? N  Using the Toilet? N  In the past six months, have you accidently leaked urine? Y  Comment Wears Depends. Followed by Urologist  Do you have problems with loss of bowel control? N  Managing your Medications? N  Managing your Finances? N  Housekeeping or managing your Housekeeping? N    Patient Care Team: Nelwyn Salisbury, MD as PCP - General [name], MD as PCP - Cardiology (Cardiology) [name], MD as Consulting Physician (Cardiology) Verner Chol, [facility] (Inactive) as Pharmacist (Pharmacist) Lbcardiology, Rounding, MD as Rounding Team (Cardiology)  Indicate any recent Medical Services you may have received from other than [facility] providers in the past year (date may be approximate).     Assessment:   This is a routine wellness examination for Chrisie.  Hearing/Vision screen Hearing Screening - Comments:: Denies  hearing difficulties   Vision Screening - Comments:: Wears rx glasses - up to date with routine eye exams with  [facility]   Goals Addressed               This Visit's Progress     Increase physical activity (pt-stated)         Depression Screen    08/28/2023    9:37 AM 07/30/2022    3:31 PM 04/03/2022    1:48 PM 12/20/2021    9:43 AM 12/07/2021   12:05 PM 07/26/2021    3:36 PM 07/20/2020    3:24 PM  PHQ 2/9 Scores  PHQ - 2 Score 0 0 4 0 2 6 0  PHQ- 9 Score   22 1 11 18      Fall Risk    08/28/2023    9:40 AM 07/30/2022    3:37 PM 04/03/2022    1:48 PM 12/20/2021    9:44 AM 12/07/2021   12:01 PM  Fall Risk   Falls in the past year? 1 0 1 1 0  Number falls in past yr: 1 0 1 1 1   Injury with Fall? 0 0 1 1 0  Comment   Concussion/ minor bruising Bruises   Risk for fall  due to : History of fall(s);Impaired balance/gait No Fall Risks History of fall(s) No Fall Risks   Follow up Falls prevention discussed;Falls evaluation completed Falls prevention discussed Falls evaluation completed Falls evaluation completed     MEDICARE RISK AT HOME: Medicare Risk at Home Any stairs in or around the home?: No If so, are there any without handrails?: No Home free of loose throw rugs in walkways, pet beds, electrical cords, etc?: Yes Adequate lighting in your home to reduce risk of falls?: Yes Life alert?: No Use of a cane, walker or w/c?: Yes Grab bars in the bathroom?: Yes Shower chair or bench in shower?: Yes Elevated toilet seat or a handicapped toilet?: No  TIMED UP AND GO:  Was the test performed?  No    Cognitive Function:    03/30/2016    3:10 PM  MMSE - Mini Mental State Exam  Orientation to time 5  Orientation to Place 4  Registration 3  Attention/ Calculation 4  Recall 2  Language- name 2 objects 2  Language- repeat 1  Language- follow 3 step command 3  Language- read & follow direction 1  Write a sentence 1  Copy design 1  Total score 27         08/28/2023    9:42 AM 07/30/2022    3:39 PM 07/26/2021    4:03 PM 07/20/2020    3:33 PM 07/14/2019   11:23 AM  6CIT Screen  What Year? 0 points 0 points 0 points 0 points 0 points  What month? 0 points 0 points 0 points 0 points 0 points  What time? 0 points 0 points 0 points  0 points  Count back from 20 0 points 0 points 0 points 0 points 0 points  Months in reverse 0 points 0 points 0 points 2 points 0 points  Repeat phrase 0 points 0 points 0 points 4 points 0 points  Total Score 0 points 0 points 0 points  0 points    Immunizations Immunization History  Administered Date(s) Administered   Influenza Split 04/26/2011   Influenza Whole 03/23/2010   Influenza,inj,Quad PF,6+ Mos 03/20/2013, 03/02/2015, 03/26/2016, 03/25/2017, 05/08/2019, 03/17/2020   Influenza-Unspecified 05/26/2014, 03/03/2018   Pneumococcal Conjugate-13 04/19/2016   Pneumococcal Polysaccharide-23 03/07/2018   Tdap 04/20/2013   Zoster Recombinant(Shingrix) 02/18/2019   Zoster, Live 03/26/2016   Zoster, Unspecified 07/20/2023    TDAP status: Due, Education has been provided regarding the importance of this vaccine. Advised may receive this vaccine at local pharmacy or Health Dept. Aware to provide a copy of the vaccination record if obtained from local pharmacy or Health Dept. Verbalized acceptance and understanding.  Flu Vaccine status: Due, Education has been provided regarding the importance of this vaccine. Advised may receive this vaccine at local pharmacy or Health Dept. Aware to provide a copy of the vaccination record if obtained from local pharmacy or Health Dept. Verbalized acceptance and understanding.  Pneumococcal vaccine status: Due, Education has been provided regarding the importance of this vaccine. Advised may receive this vaccine at local pharmacy or Health Dept. Aware to provide a copy of the vaccination record if obtained from local pharmacy or Health Dept. Verbalized acceptance and  understanding.  Covid-19 vaccine status: Declined, Education has been provided regarding the importance of this vaccine but patient still declined. Advised may receive this vaccine at local pharmacy or Health Dept.or vaccine clinic. Aware to provide a copy of the vaccination record if obtained from local pharmacy or Health Dept. Verbalized acceptance  and understanding.  Qualifies for Shingles Vaccine? Yes   Zostavax completed Yes   Shingrix Completed?: Yes  Screening Tests Health Maintenance  Topic Date Due   MAMMOGRAM  03/26/2020   DEXA SCAN  06/14/2020   INFLUENZA VACCINE  01/17/2023   COVID-19 Vaccine (1 - 2024-25 season) Never done   Pneumonia Vaccine 47+ Years old (3 of 3 - PPSV23 or PCV20) 03/08/2023   DTaP/Tdap/Td (2 - Td or Tdap) 04/21/2023   Medicare Annual Wellness (AWV)  08/27/2024   Colonoscopy  05/20/2031   Hepatitis C Screening  Completed   Zoster Vaccines- Shingrix  Completed   HPV VACCINES  Aged Out    Health Maintenance  Health Maintenance Due  Topic Date Due   MAMMOGRAM  03/26/2020   DEXA SCAN  06/14/2020   INFLUENZA VACCINE  01/17/2023   COVID-19 Vaccine (1 - 2024-25 season) Never done   Pneumonia Vaccine 57+ Years old (3 of 3 - PPSV23 or PCV20) 03/08/2023   DTaP/Tdap/Td (2 - Td or Tdap) 04/21/2023    Colorectal cancer screening: Type of screening: Colonoscopy. Completed 05/19/21. Repeat every 10 years  Mammogram status: Ordered 08/28/23. Pt provided with contact info and advised to call to schedule appt.   Bone Density status: Ordered 08/28/23. Pt provided with contact info and advised to call to schedule appt.     Additional Screening:  Hepatitis C Screening: does qualify; Completed 08/08/15  Vision Screening: Recommended annual ophthalmology exams for early detection of glaucoma and other disorders of the eye. Is the patient up to date with their annual eye exam?  Yes  Who is the provider or what is the name of the office in which the patient  attends annual eye exams? [facility] If pt is not established with a provider, would they like to be referred to a provider to establish care? No .   Dental Screening: Recommended annual dental exams for proper oral hygiene    Community Resource Referral / Chronic Care Management:  CRR required this visit?  No   CCM required this visit?  No     Plan:     I have personally reviewed and noted the following in the patient's chart:   Medical and social history Use of alcohol, tobacco or illicit drugs  Current medications and supplements including opioid prescriptions. Patient is currently taking opioid prescriptions. Information provided to patient regarding non-opioid alternatives. Patient advised to discuss non-opioid treatment plan with their provider. Functional ability and status Nutritional status Physical activity Advanced directives List of other physicians Hospitalizations, surgeries, and ER visits in previous 12 months Vitals Screenings to include cognitive, depression, and falls Referrals and appointments  In addition, I have reviewed and discussed with patient certain preventive protocols, quality metrics, and best practice recommendations. A written personalized care plan for preventive services as well as general preventive health recommendations were provided to patient.     Tillie Rung, LPN   1/61/0960   After Visit Summary: (MyChart) Due to this being a telephonic visit, the after visit summary with patients personalized plan was offered to patient via MyChart   Nurse Notes: None

## 2023-09-02 ENCOUNTER — Ambulatory Visit: Payer: PPO | Admitting: Cardiology

## 2023-09-03 ENCOUNTER — Telehealth: Payer: Self-pay

## 2023-09-03 ENCOUNTER — Other Ambulatory Visit (HOSPITAL_COMMUNITY): Payer: Self-pay

## 2023-09-03 NOTE — Telephone Encounter (Signed)
 Per test claim: The current 30 day co-pay is, $5.00.  No PA needed at this time. This test claim was processed through [facility]- copay amounts may vary at other pharmacies due to pharmacy/plan contracts, or as the patient moves through the different stages of their insurance plan.

## 2023-09-03 NOTE — Telephone Encounter (Signed)
 Pharmacy Patient Advocate Encounter   Received notification from Patient Advice Request messages that prior authorization for Oxycodone 10 mg tablets is required/requested.   Insurance verification completed.   The patient is insured through [facility] ADVANTAGE/RX ADVANCE .   Per test claim: The current 30 day co-pay is, $5.00.  No PA needed at this time. This test claim was processed through [facility]- copay amounts may vary at other pharmacies due to pharmacy/plan contracts, or as the patient moves through the different stages of their insurance plan.

## 2023-09-05 ENCOUNTER — Other Ambulatory Visit: Payer: Self-pay | Admitting: Family Medicine

## 2023-09-05 NOTE — Telephone Encounter (Signed)
 Copied from CRM [id number]. Topic: Clinical - Medication Refill >> Sep 05, 2023  4:53 PM Eunice Blase wrote: Most Recent Primary Care Visit:  Provider: Tillie Rung  Department: [facility]  Visit Type: MEDICARE AWV, SEQUENTIAL  Date: 08/28/2023  Medication: Oxycodone HCl 10 MG TABS (please write script for 30 days) pt only has partial)  Has the patient contacted their pharmacy? Yes (Agent: If no, request that the patient contact the pharmacy for the refill. If patient does not wish to contact the pharmacy document the reason why and proceed with request.) (Agent: If yes, when and what did the pharmacy advise?)Pharmacy needs new script for 30 days  Is this the correct pharmacy for this prescription? Yes If no, delete pharmacy and type the correct one.  This is the patient's preferred pharmacy:  [facility] - [place], [place] - [address] [place] [address] Phone: [phone] Fax: [phone]   Has the prescription been filled recently? Yes  Is the patient out of the medication? Yes  Has the patient been seen for an appointment in the last year OR does the patient have an upcoming appointment? Yes  Can we respond through MyChart? Yes  Agent: Please be advised that Rx refills may take up to 3 business days. We ask that you follow-up with your pharmacy.

## 2023-09-06 ENCOUNTER — Other Ambulatory Visit: Payer: Self-pay | Admitting: Family Medicine

## 2023-09-06 ENCOUNTER — Telehealth: Payer: Self-pay

## 2023-09-06 NOTE — Telephone Encounter (Signed)
 Done

## 2023-09-06 NOTE — Telephone Encounter (Signed)
 Copied from CRM [id number]. Topic: Clinical - Prescription Issue >> Sep 06, 2023  1:20 PM Jon Gills C wrote: Reason for CRM: Patient called in regarding  Oxycodone HCl 10 MG TABS [Pharmacy Med Name: OXYCODONE HCL 10 MG TABS 10 Tablet] Stated that the pharmacy stated that Dr.Fry needs to release it in order for patient to receive medication is hoping to hear something before the end of the day today

## 2023-09-06 NOTE — Telephone Encounter (Signed)
 Spoke with pt pharmacy this afternoon, this issue has been resolved

## 2023-09-06 NOTE — Telephone Encounter (Signed)
 FYI Spoke with pt pharmacy state that pt only received 7 days supply (28 Tab)for the Oxycodone from quantity of 120 as prescribed by Dr Clent Ridges on 08/24/23. Pt has been completely out of meds, since there was another Rx dated for April. Pt pharmacy stated that they will change the date and fill the prescription on file since pt PCP is out of the office.

## 2023-09-06 NOTE — Telephone Encounter (Signed)
 Copied from CRM [id number]. Topic: General - Other >> Sep 06, 2023 10:10 AM Truddie Crumble wrote: Reason for [id number] from [facility] called stating the doctor on feb 28 issued the medication oxycodone for the patient. The first prescription of three fills the patient got 28 tablets and the pharmacy voided out the other ones due to the stop act. The patient only got a seven day supply and she is also a opoid naive patient so they had to limit her. The pharmacy need permission to fill the medication that is dated for 3/28 because the patient is out of medication CB [phone]

## 2023-09-06 NOTE — Telephone Encounter (Signed)
 Copied from CRM [id number]. Topic: Clinical - Prescription Issue >> Sep 05, 2023  4:59 PM Eunice Blase wrote: Reason for CRM: Received call from [facility] - [place], [place] - [address] regarding Oxycodone HCl 10 MG TABS. Casimiro Needle stated pt only received partial medication. Please call pharmacy.

## 2023-09-09 NOTE — Telephone Encounter (Signed)
 FYI This has already been sent please close pt chart

## 2023-09-09 NOTE — Telephone Encounter (Signed)
 Already done

## 2023-09-12 ENCOUNTER — Encounter (HOSPITAL_BASED_OUTPATIENT_CLINIC_OR_DEPARTMENT_OTHER): Payer: Self-pay | Admitting: Adult Health

## 2023-09-12 ENCOUNTER — Ambulatory Visit (HOSPITAL_BASED_OUTPATIENT_CLINIC_OR_DEPARTMENT_OTHER): Payer: PPO | Admitting: Adult Health

## 2023-09-12 VITALS — BP 122/80 | HR 93 | Ht 62.0 in | Wt 191.6 lb

## 2023-09-12 DIAGNOSIS — J849 Interstitial pulmonary disease, unspecified: Secondary | ICD-10-CM | POA: Diagnosis not present

## 2023-09-12 NOTE — Patient Instructions (Addendum)
 Continue on Trelegy 1 puff daily, rinse after use Albuterol inhaler or neb as needed Activity as tolerated.  Continue on Oxygen 2l/m with activity, goal is to keep O2 sats >88-90%. Continue on Inspire At bedtime with follow up with Dr. Mayford Knife as planned .  Set up HRCT chest in 3 months   Work on not smoking  Follow up Dr. Vassie Loll  in 4 months and As needed

## 2023-09-12 NOTE — Progress Notes (Signed)
 @Patient  ID: [name] [name], female    DOB: 14-Sep-1955, 68 y.o.   MRN: [id number]  Chief Complaint  Patient presents with   Follow-up   Discussed the use of AI scribe software for clinical note transcription with the patient, who gave verbal consent to proceed.  Referring provider: Nelwyn Salisbury, MD  HPI: 68 year old female active smoker followed for Emphysema, interstitial lung disease, pulmonary nodule and chronic respiratory failure (O2 started 09/2022) Patient has a history of sleep apnea, CPAP intolerant status post a hypoglossal nerve stimulator implant by Dr. Jenne Pane in October 2020 managed by cardiology-Dr. Mayford Knife (she is on level 3 1.4 V) Medical history significant for ADHD and bipolar disorder, chronic pain .   TEST/EVENTS :  Serology 06/2022 ENA RNP + 1.0  LHC 06/2022 LVEDP 25, non obs CAD   PFTs 05/2022 no airway obstruction, ratio 84, FEV1 100%, FVC 91%, TLC 99%, DLCO 13.73/74%   06/2022  HRCT chest>> probable UIP pattern, left upper lobe nodule unchanged   HRCT 01/2022 >> emphysema, probable UIP, dates back to 05/2016, unchanged left upper lobe nodule   CT chest 05/2021 showed emphysema, 13 mm left upper lobe nodule peripheral and bibasilar and peripheral reticular markings, 11 mm right paratracheal lymph node    CT chest with contrast 09/2021 -showed unchanged left upper lobe nodule and similar changes of ILD.   CT angiogram chest 11/2021 showed right paratracheal lymph node 1.3 cm, unchanged nodule and similar peripheral reticular opacities   04/2020 Inspire titration  07/2021 HST showed AHI of 18/hour on 2.7 V?  Overstimulation HST 11/2021 (on Inspire) mild, AHI 7/h ,low desat 86% on 1.8 V   09/12/2023 Follow up : COPD with Emphysema, ILD, Pulmonary Nodule  [name] [name] "[name] [name]" is a 68 year old female with COPD, emphysema, and chronic respiratory failure who presents for a three month follow-up.  She has chronic respiratory conditions, including COPD with  emphysema and interstitial lung disease. Her breathing has been stable, and she continues to use her Trelegy inhaler daily. Uses Albuterol neb most days.  Remains on Oxygen 2l/m with activity , does not have to use every day. We discussed getting HRCT chest on return visit.   She experiences significant issues with balance and dizziness, which have led to multiple falls  An ENTdiagnosed her with vertigo. Her ears constantly feel 'stopped up', and she experiences a sensation of 'clouds of cotton' in her head. These symptoms have led her to stop driving due to safety concerns.Family member is here with her today .   She is currently taking oxycodone for back pain, which she resumed after discontinuing another pain management option.   She also uses an Inspire device at night for her sleep apnea. Managed by Cardiology , Dr. Mayford Knife.   . She is an active smoker and has been working on quitting. Smoking cessation discussed.              Allergies  Allergen Reactions   Sulfonamide Derivatives Swelling    Facial swelling   Flexeril [Cyclobenzaprine] Other (See Comments)    Dizziness, made meniere's disease worse    Hydromorphone Itching   Morphine And Codeine Nausea And Vomiting   Sulfamethoxazole Other (See Comments)    Causes swelling     Immunization History  Administered Date(s) Administered   Influenza Split 04/26/2011   Influenza Whole 03/23/2010   Influenza,inj,Quad PF,6+ Mos 03/20/2013, 03/02/2015, 03/26/2016, 03/25/2017, 05/08/2019, 03/17/2020   Influenza-Unspecified 05/26/2014, 03/03/2018   Pneumococcal Conjugate-13 04/19/2016  Pneumococcal Polysaccharide-23 03/07/2018   Tdap 04/20/2013   Zoster Recombinant(Shingrix) 02/18/2019   Zoster, Live 03/26/2016   Zoster, Unspecified 07/20/2023    Past Medical History:  Diagnosis Date   Allergic rhinitis 01/23/2008   Angina pectoris 06/06/2016   Arthritis    Attention deficit hyperactivity disorder (ADHD) 02/07/2016   Back  pain, lumbar 01/21/2009   Bell's palsy 02/06/2008   Bipolar disorder 12/13/2009   Chronic idiopathic constipation    Dizziness 04/16/2008   Edema, leg 01/21/2009   Epigastric pain    Essential hypertension 01/23/2008   Gastro-esophageal reflux disease without esophagitis 01/23/2008   Generalized anxiety disorder 01/23/2008   Headache 04/15/2007   Hidradenitis 10/04/2008   History of colonic polyps    Hyperlipidemia    Hypokalemia 07/23/2018   Insomnia 01/23/2008   Major depressive disorder 01/23/2008   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   Neck pain 05/06/2017   Obesity (BMI 30-39.9) 11/10/2015   OSA (obstructive sleep apnea) 04/15/2007   inspire implant; does not always remember to turn device on   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/2008   Qualifier: Diagnosis of  By: Clent Ridges MD, Tera Mater    PUD (peptic ulcer disease) 06/06/2016   Unspecified urinary incontinence 01/23/2008   Wears dentures    Wears glasses     Tobacco History: Social History   Tobacco Use  Smoking Status Some Days   Current packs/day: 0.50   Average packs/day: 0.5 packs/day for 0.3 years (0.2 ttl pk-yrs)   Types: Cigarettes   Start date: 12/1990   Last attempt to quit: 12/2020   Passive exposure: Past (occasional)  Smokeless Tobacco Never   Ready to quit: Not Answered Counseling given: Not Answered   Outpatient Medications Prior to Visit  Medication Sig Dispense Refill   acyclovir (ZOVIRAX) 400 MG tablet TAKE 1 TABLET BY MOUTH 4 TIMES A DAY AS NEEDED 120 tablet 5   albuterol (PROVENTIL) (2.5 MG/3ML) 0.083% nebulizer solution Take 3 mLs (2.5 mg total) by nebulization every 6 (six) hours as needed for wheezing or shortness of breath. 75 mL 12   albuterol (VENTOLIN HFA) 108 (90 Base) MCG/ACT inhaler Inhale 1-2 puffs into the lungs every 6 (six) hours as needed. 8 g 2   alprazolam (XANAX) 2 MG tablet Take 1 tablet (2 mg total) by mouth 3 (three) times daily as needed for anxiety. 90 tablet 5   Ascorbic Acid (VITAMIN C PO)  Take by mouth.     aspirin EC 81 MG tablet Take 81 mg by mouth daily. Swallow whole.     Aspirin-Acetaminophen-Caffeine (GOODYS EXTRA STRENGTH PO) Take by mouth.     B Complex-C-Folic Acid (B-COMPLEX/VITAMIN C PO) Take 1 tablet by mouth daily.     Cholecalciferol (VITAMIN D3) 125 MCG (5000 UT) CAPS Take 5,000 Units by mouth once a week.     Fluticasone-Umeclidin-Vilant (TRELEGY ELLIPTA) 100-62.5-25 MCG/ACT AEPB Inhale 1 puff into the lungs daily. 60 each 5   hydrocortisone (ANUSOL-HC) 2.5 % rectal cream PLACE 1 APPLICATION RECTALLY 2 TIMES DAILY. 30 g 1   Magnesium 500 MG CAPS Take 500 mg by mouth daily.     MAGNESIUM PO Take by mouth.     Multiple Vitamins-Minerals (MULTIVITAMIN WITH MINERALS) tablet Take 1 tablet by mouth daily.     omeprazole (PRILOSEC) 20 MG capsule TAKE 1 CAPSULE BY MOUTH 2 TIMES DAILY BEFORE A MEAL. 180 capsule 3   Oxycodone HCl 10 MG TABS Take 1 tablet (10 mg total) by mouth every 6 (six) hours as needed (pain). 120  tablet 0   Oxycodone HCl 10 MG TABS Take 1 tablet (10 mg total) by mouth every 6 (six) hours as needed (pain). 120 tablet 0   Oxycodone HCl 10 MG TABS Take 1 tablet (10 mg total) by mouth every 6 (six) hours as needed (pain). 120 tablet 0   polyethylene glycol (MIRALAX / GLYCOLAX) 17 g packet Use one packet daily (Patient taking differently: Take 17 g by mouth 2 (two) times daily as needed for moderate constipation.) 90 each 3   potassium chloride (KLOR-CON) 10 MEQ tablet TAKE 1 TABLET (10 MEQ TOTAL) BY MOUTH DAILY. 90 tablet 3   rosuvastatin (CRESTOR) 40 MG tablet TAKE 1 TABLET BY MOUTH DAILY. 90 tablet 3   triamterene-hydrochlorothiazide (DYAZIDE) 37.5-25 MG capsule Take 1 capsule by mouth every morning.     FLUoxetine (PROZAC) 10 MG capsule Take 1 capsule (10 mg total) by mouth daily. (Patient not taking: Reported on 09/12/2023) 14 capsule 0   FLUoxetine (PROZAC) 20 MG capsule Take 1 capsule (20 mg total) by mouth daily. (Patient not taking: Reported on  09/12/2023) 14 capsule 0   No facility-administered medications prior to visit.     Review of Systems:   Constitutional:   No  weight loss, night sweats,  Fevers, chills,+ fatigue, or  lassitude.  HEENT:   No headaches,  Difficulty swallowing,  Tooth/dental problems, or  Sore throat,                No sneezing, itching, ear ache, nasal congestion, post nasal drip,   CV:  No chest pain,  Orthopnea, PND, swelling in lower extremities, anasarca, dizziness, palpitations, syncope.   GI  No heartburn, indigestion, abdominal pain, nausea, vomiting, diarrhea, change in bowel habits, loss of appetite, bloody stools.   Resp:   No chest wall deformity  Skin: no rash or lesions.  GU: no dysuria, change in color of urine, no urgency or frequency.  No flank pain, no hematuria   MS:  No joint pain or swelling.  No decreased range of motion.  No back pain.    Physical Exam  BP 122/80   Pulse 93   Ht 5\' 2"  (1.575 m)   Wt 191 lb 9.6 oz (86.9 kg)   SpO2 93%   BMI 35.04 kg/m   GEN: A/Ox3; pleasant , NAD, well nourished    HEENT:  [place]/AT,  NOSE-clear, THROAT-clear, no lesions, no postnasal drip or exudate noted.   NECK:  Supple w/ fair ROM; no JVD; normal carotid impulses w/o bruits; no thyromegaly or nodules palpated; no lymphadenopathy.    RESP  Clear  P & A; w/o, wheezes/ rales/ or rhonchi. no accessory muscle use, no dullness to percussion  CARD:  RRR, no m/r/g, no peripheral edema, pulses intact, no cyanosis or clubbing.  GI:   Soft & nt; nml bowel sounds; no organomegaly or masses detected.   Musco: Warm bil, no deformities or joint swelling noted.   Neuro: alert, no focal deficits noted.    Skin: Warm, no lesions or rashes    Lab Results:    BNP   ProBNP No results found for: "PROBNP"  Imaging: No results found.  Administration History     None          Latest Ref Rng & Units 04/25/2023    1:10 PM 06/08/2022    9:58 AM  PFT Results  FVC-Pre L 2.45   2.63   FVC-Predicted Pre % 85  91   FVC-Post L 2.48  2.70   FVC-Predicted Post % 86  93   Pre FEV1/FVC % % 86  84   Post FEV1/FCV % % 87  87   FEV1-Pre L 2.11  2.21   FEV1-Predicted Pre % 97  100   FEV1-Post L 2.15  2.34   DLCO uncorrected ml/min/mmHg 11.64  13.73   DLCO UNC% % 63  74   DLCO corrected ml/min/mmHg 11.64  13.73   DLCO COR %Predicted % 63  74   DLVA Predicted % 72  76   TLC L 4.15  4.72   TLC % Predicted % 87  99   RV % Predicted % 61  53     No results found for: "NITRICOXIDE"      Assessment & Plan:  Assessment and Plan    Chronic Obstructive Pulmonary Disease (COPD) with emphysema   COPD with emphysema is managed with a daily Trelegy inhaler and albuterol as needed. . Smoking cessation is encouraged Continue Trelegy inhaler daily, use albuterol as needed,     Interstitial Lung Disease  -changes on imaging.  Interstitial lung disease  The last pulmonary function test was stable,  Set up for HRCT chest on return office visit.   Chronic Respiratory Failure   Chronic respiratory failure continue with supplemental oxygen as needed. To keep O2 sats >88-90%.   Vertigo   She experiences vertigo, leading to balance issues and falls. Under the care of an ENT specialist, she has stopped driving due to symptoms and is seeking further evaluation and management. Continue follow-up with the ENT specialist for vertigo management.  Chronic Pain   She experiences chronic pain, primarily due to back pain, and is taking oxycodone for management. Use caution with sedating medications.        Rubye Oaks, NP 09/12/2023

## 2023-09-17 DIAGNOSIS — H40033 Anatomical narrow angle, bilateral: Secondary | ICD-10-CM | POA: Diagnosis not present

## 2023-09-17 DIAGNOSIS — H2513 Age-related nuclear cataract, bilateral: Secondary | ICD-10-CM | POA: Diagnosis not present

## 2023-09-23 DIAGNOSIS — J9611 Chronic respiratory failure with hypoxia: Secondary | ICD-10-CM | POA: Diagnosis not present

## 2023-09-30 DIAGNOSIS — F332 Major depressive disorder, recurrent severe without psychotic features: Secondary | ICD-10-CM | POA: Diagnosis not present

## 2023-10-02 DIAGNOSIS — F0781 Postconcussional syndrome: Secondary | ICD-10-CM | POA: Diagnosis not present

## 2023-10-21 ENCOUNTER — Telehealth: Payer: Self-pay | Admitting: Family Medicine

## 2023-10-21 DIAGNOSIS — F0781 Postconcussional syndrome: Secondary | ICD-10-CM

## 2023-10-21 NOTE — Telephone Encounter (Signed)
 Copied from CRM [id number]. Topic: Referral - Request for Referral >> Oct 21, 2023 11:40 AM Artemio Larry wrote: Did the patient discuss referral with their provider in the last year? Yes (If No - schedule appointment) (If Yes - send message)  Appointment offered? No  Type of order/referral and detailed reason for visit: Wants a referral to someone who can help with post concussion trauma syndrome/miosis   Preference of office, provider, location: Patient prefers a female is possible   If referral order, have you been seen by this specialty before? No (If Yes, this issue or another issue? When? Where?  Can we respond through MyChart? No, please call Autry Legions patient spouse directly at [phone] >> Oct 21, 2023  3:30 PM Magdalene School wrote: Patient's husband calling back to inform of where to send the referral to.  [facility] Address: [address], [place], [place] [address] Phone: [phone]

## 2023-10-21 NOTE — Telephone Encounter (Signed)
 Copied from CRM [id number]. Topic: Referral - Request for Referral >> Oct 21, 2023 11:40 AM Artemio Larry wrote: Did the patient discuss referral with their provider in the last year? Yes (If No - schedule appointment) (If Yes - send message)  Appointment offered? No  Type of order/referral and detailed reason for visit: Wants a referral to someone who can help with post concussion trauma syndrome/miosis   Preference of office, provider, location: Patient prefers a female is possible   If referral order, have you been seen by this specialty before? No (If Yes, this issue or another issue? When? Where?  Can we respond through MyChart? No, please call Autry Legions patient spouse directly at [phone]

## 2023-10-22 NOTE — Telephone Encounter (Signed)
I did the referral to Neurology  

## 2023-10-23 ENCOUNTER — Ambulatory Visit: Admitting: Cardiology

## 2023-10-23 DIAGNOSIS — J9611 Chronic respiratory failure with hypoxia: Secondary | ICD-10-CM | POA: Diagnosis not present

## 2023-10-25 ENCOUNTER — Ambulatory Visit: Admitting: Cardiology

## 2023-10-25 ENCOUNTER — Encounter: Payer: Self-pay | Admitting: Cardiology

## 2023-10-25 VITALS — BP 122/74 | HR 77 | Ht 62.0 in | Wt 191.4 lb

## 2023-10-25 NOTE — Patient Instructions (Signed)
 [name] [name]

## 2023-10-25 NOTE — Progress Notes (Unsigned)
 This encounter was created in error - please disregard.

## 2023-10-25 NOTE — Progress Notes (Unsigned)
  left-sided tongue deviation.  Suspected that this may be distorting anatomy and causing her  to actually have more apneas and resulting in more snoring.  Her device was reprogrammed after testing her tongue motion at multiple levels.  1.4 V showed good tongue motion.  Her device was reprogrammed with a range of 1.2 to 1.6 V.  Her optimal level will be level 3 which is 1.4 V. -OV  07/19/21 she was doing well and had lost 9lbs.  She was continued on her current setting of level 3 which is 1.4V. -OV 02/27/2023:  doing well on level 4 (1.5V)>>no changes made -OV today 805/02/2023: Patient is doing well currently on level 4 (1.5 V).  No changes were made to programming today   #Hypertension - BP is controlled on exam today - Continue prescription drug management with Dyazide 37.5-25 mg daily with as needed refills -I have personally reviewed and interpreted outside labs performed by patient's PCP which showed ***  #Nonobstructive CAD #Hyperlipidemia - Cath 07/07/2022 showing 20% proximal to mid LAD and 40% proximal RCA with elevated LVEDP consistent with diastolic dysfunction.  - 2D echo 07/07/2022 showed normal LV function with normal diastolic function - She has not had any anginal symptoms - Continue aspirin  81 mg daily, Crestor  40 mg daily with as needed refills -I have personally reviewed and interpreted outside labs performed by patient's PCP which showed ***   Medication Adjustments/Labs and Tests Ordered: Current medicines are reviewed at length with the patient today.  Concerns regarding medicines are outlined above.   Tests Ordered: No orders of the defined types were placed in this encounter.   Medication Changes: No orders of the defined types were placed in this encounter.   Follow Up:  In Person in 4 week(s) with Inspire Rep  Signed, Gaylyn Keas, MD  10/25/2023 2:51 PM    [facility]

## 2023-10-25 NOTE — Progress Notes (Deleted)
 Date:  10/25/2023   ID:  [name] [name], DOB 13-Dec-1955, MRN [id number] The patient was identified using 2 identifiers.  Patient Location: Home Provider Location: Home Office   PCP:  Donley Furth, MD   [facility] Providers Cardiologist:  Gaylyn Keas, MD     Evaluation Performed:  Follow-Up Visit  Chief Complaint:  OSA  History of Present Illness:    [name] [name] is a 68 y.o. female with a history of ADHD, bipolar disorder, hypertension, GERD, hyperlipidemia, obesity.  She has a history of nonobstructive CAD with cath 07/07/2022 showing 20% proximal to mid LAD and 40% proximal RCA with elevated LVEDP consistent with diastolic dysfunction.  2D echo 07/04/2022 showed EF 60 to 65% with normal diastolic function.    She has a hx of OSA on CPAP therapy.  Unfortunately she was intolerant to CPAP and requested ENT evaluation of  Inspire. Device.  She underwent hypoglossal nerve stimulator implant by Dr. Tellis Feathers on 03/25/2019.     She was seen back on 05/01/2019 for device activation.  She was then seen in the office on 10/06/2019 and was complaining of feeling very sleepy and felt that her device need to be reprogrammed.  She was having a lot of daytime sleepiness as well as snoring.  Nerve stimulation was performed and her amplitude was increased from 1.7 to 2.5 V.  Her control range was changed to 2.3 to 3.3 V.  A few weeks later she had an in lab study done 04/30/2020 showed which showed no significant obstructive sleep apnea using her inspire device.     I then saw her back 07/31/2021 and she was doing great with her inspire device and was on level 5 which was 2.7 V.  She was feeling rested when she get up in the morning and had no daytime sleepiness.  She had no tongue sensitivity and her husband said she was very lightly snoring.  At that time an Itamar sleep study was ordered which showed moderate obstructive sleep apnea with an AHI of 17.9/h and O2 sats as low as 81%.    She is now brought back into the office for further evaluation due to an increase in her AHI.  At last office visit she was had really struggling with her inspire device and has not been able to sleep at night.  She was having a severe sore throat when she would wake up up in the morning and felt like her tongue was all the way out of her mouth all night and very painful in the morning.   At that office visit  interrogation of her device was performed and showed that her thresholds had decreased to 1 V and it was likely that she had had a decrease in stimulation threshold over time due to healing and did not require a high pacing threshold any longer and was resulting in overstimulation of the hypoglossal nerve which was changing her anatomy  resulting in more apneas. Her parameters were changed with the stimulation range of 1.4 V to 1.8 V.  Her goal was to get to level 2 or 3 which would be 1.5 to 1.6 V.   She underwent Itamar sleep study 11/23/21 to make sure that her apneas were stable on current inspire settings.  This showed mild obstructive sleep apnea with an AHI 7.3/h   She was seen again 11.14.2023 due to recurrent snoring again even when she was on her side.  She  had gained over 15 lbs since May.  She was on level 5 which is 1.8 volts.  She also had been more sleepy than usual.   She has been placed on Gabapentin  that has caused weight gain. She also was just dx with ILD and emphysema. The gabapentin  makes her sleepy as well. Her voltage was increased but that resulted in significant protrusion of her tongue with angulation to the left.  She was kept at level 5 (1.8V).  She underwent Itamar HST which showed very mild OSA with an AHI of 9/hr and no central events.    Office visit today 06/07/2022 her device was interrogated.  She was at level 5 which was 1.8 V and testing at this voltage showed prominent left-sided tongue deviation. Suspected that this was distorting anatomy and causing her  to  actually have more apneas and resulting in more snoring.  Her device was reprogrammed after testing her tongue motion at multiple levels.  1.4 V showed good tongue motion.  Her device was reprogrammed with a range of 1.2 to 1.6 V.  Her optimal level was level 3 which is 1.4 V.  OV 07/2022 doing well.  At her last office visit she was having some problems with feeling sleepy during the day and having to nap during the day.  Part of that was likely related to her stopping her Prozac  by accident and started having hallucinations.  She has done fine since being back on Prozac .  At last office visit she was on level 4 which was 1.5 V and doing well and no changes were made.    Today she is back for follow-up.  She is sleeping on average ***hours per night.  She feels rested when she gets up in the morning.  She has not really had any significant daytime sleepiness.  Her current level is 4 (1.5 V).  She denies any tongue discomfort or sore throats on this level.  Past Medical History:  Diagnosis Date   Allergic rhinitis 01/23/2008   Angina pectoris 06/06/2016   Arthritis    Attention deficit hyperactivity disorder (ADHD) 02/07/2016   Back pain, lumbar 01/21/2009   Bell's palsy 02/06/2008   Bipolar disorder 12/13/2009   Chronic idiopathic constipation    Dizziness 04/16/2008   Edema, leg 01/21/2009   Epigastric pain    Essential hypertension 01/23/2008   Gastro-esophageal reflux disease without esophagitis 01/23/2008   Generalized anxiety disorder 01/23/2008   Headache 04/15/2007   Hidradenitis 10/04/2008   History of colonic polyps    Hyperlipidemia    Hypokalemia 07/23/2018   Insomnia 01/23/2008   Major depressive disorder 01/23/2008   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   Neck pain 05/06/2017   Obesity (BMI 30-39.9) 11/10/2015   OSA (obstructive sleep apnea) 04/15/2007   inspire implant; does not always remember to turn device on   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/2008   Qualifier: Diagnosis of  By: Alyne Babinski MD,  Lenis Quin    PUD (peptic ulcer disease) 06/06/2016   Unspecified urinary incontinence 01/23/2008   Wears dentures    Wears glasses    Past Surgical History:  Procedure Laterality Date   ABDOMINAL HYSTERECTOMY     ANTERIOR FUSION CERVICAL SPINE  2006   Dr. Lamon Pillow   BREAST BIOPSY Right 1981   benign   BREAST EXCISIONAL BIOPSY Right 1981   benign   scar does not show   BREAST SURGERY Bilateral 12/11/2012   reductions per Dr. Willadean Hark in [facility]    CATARACT EXTRACTION,  BILATERAL Bilateral last summer of 2018   at [facility]   COLONOSCOPY  03/07/2021   per Dr. Tova Fresh, adenomatous polyps, repeat in 7 yrs   DRUG INDUCED ENDOSCOPY N/A 01/30/2019   Procedure: DRUG INDUCED ENDOSCOPY;  Surgeon: Virgina Grills, MD;  Location: [facility];  Service: ENT;  Laterality: N/A;   ESOPHAGOGASTRODUODENOSCOPY  04/23/2011   per Dr. Tova Fresh, normal    EYE SURGERY  06/18/2016   L eyelid    IMPLANTATION OF HYPOGLOSSAL NERVE STIMULATOR Right 03/25/2019   Procedure: IMPLANTATION OF HYPOGLOSSAL NERVE STIMULATOR;  Surgeon: Virgina Grills, MD;  Location[facility] OR;  Service: ENT;  Laterality: Right;   LEFT HEART CATH AND CORONARY ANGIOGRAPHY N/A 07/05/2022   Procedure: LEFT HEART CATH AND CORONARY ANGIOGRAPHY;  Surgeon: Swaziland, Peter M, MD;  Location: [facility] INVASIVE CV LAB;  Service: Cardiovascular;  Laterality: N/A;   LUMBAR DISC SURGERY  2003   Dr. Lamon Pillow   MULTIPLE TOOTH EXTRACTIONS     ORIF WRIST FRACTURE Right 08/29/2018   REDUCTION MAMMAPLASTY Bilateral 2013   SPLIT NIGHT STUDY  11/11/2015   UVULOPALATOPHARYNGOPLASTY     VAGINAL HYSTERECTOMY  05/1995   secondary to fibroids.     No outpatient medications have been marked as taking for the 10/28/23 encounter (Appointment) with Jacqueline Matsu, MD.     Allergies:   Sulfonamide derivatives, Flexeril  [cyclobenzaprine ], Hydromorphone , Morphine  and codeine, and Sulfamethoxazole   Social History   Tobacco Use   Smoking status: Some Days     Current packs/day: 0.50    Average packs/day: 0.5 packs/day for 0.4 years (0.2 ttl pk-yrs)    Types: Cigarettes    Start date: 12/1990    Last attempt to quit: 12/2020    Passive exposure: Past (occasional)   Smokeless tobacco: Never  Vaping Use   Vaping status: Former  Substance Use Topics   Alcohol use: Not Currently   Drug use: No     Family Hx: The patient's family history includes Alcohol abuse in an other family member; Arthritis in an other family member; Asthma in her father; Coronary artery disease in her brother, sister, and another family member; Dementia in her mother; Depression in an other family member; Diabetes in an other family member; Hyperlipidemia in her brother, mother, sister, sister, and another family member; Hypertension in her brother, sister, sister, and another family member; Sudden death in an other family member; Thyroid  disease in her sister.  ROS:   Please see the history of present illness.     All other systems reviewed and are negative.   Prior Sleep studies:   The following studies were reviewed today:  none  Labs/Other Tests and Data Reviewed:     Recent Labs: 01/23/2023: ALT 18; BNP 4.8; TSH 2.190 06/02/2023: BUN 16; Creatinine, Ser 1.11; Hemoglobin 13.4; Platelets 212; Potassium 3.3; Sodium 138   Wt Readings from Last 3 Encounters:  10/25/23 191 lb 6.4 oz (86.8 kg)  09/12/23 191 lb 9.6 oz (86.9 kg)  08/28/23 190 lb (86.2 kg)     Risk Assessment/Calculations:      Objective:    Vital Signs:  There were no vitals taken for this visit.  GEN: Well nourished, well developed in no acute distress HEENT: Normal NECK: No JVD; No carotid bruits LYMPHATICS: No lymphadenopathy CARDIAC:RRR, no murmurs, rubs, gallops RESPIRATORY:  Clear to auscultation without rales, wheezing or rhonchi  ABDOMEN: Soft, non-tender, non-distended MUSCULOSKELETAL:  No edema; No deformity  SKIN: Warm and dry NEUROLOGIC:  Alert and oriented  x 3 PSYCHIATRIC:   Normal affect   ASSESSMENT & PLAN:    #OSA -intolerant to CPAP therapy -s/p hypoglossal nerve stimulator -As stated above her stimulation range was increased to 2.3-3.3V at office visit 09/2019 with repeat sleep study in November 2021 showing no residual sleep apnea -Itamar sleep study was done 08/08/2021 for follow-up which showed moderate obstructive sleep apnea with an AHI of 17.9/h -At last OV she was having a lot of difficulty with sore throat in the morning as well as feeling like her tongue is out of her mouth the hallway all night and has not been sleeping well at all -Her thresholds had decreased to 1 V and it was felt that she had had a decrease in stimulation threshold over time due to healing and did not require a high pacing threshold any longef resulting in overstimulation of the hypoglossal nerve which is changing her anatomy and was resulting in more apneas. -Her parameters were changed with the stimulation range of 1.4 V to 1.8 V with a  goal  to get to level 2 or 3 which would be 1.5 to 1.6 V. -She is doing great on the current settings and is on level 5 with complete resolution of her mouth dryness and sore throat and has no daytime sleepiness -A Itamar home sleep study in June 2023 showed residual mild obstructive sleep apnea on level 5 at 1.8V with an AHI of 7.3/h but down from 17/hour at her baseline study -OV  05/01/2022 she had increased snoring on her side and had gained 15lbs since May and suspect that she is having more apneas>>increased voltage was tried in office but had significant protrusion with angulation to the left >> kept at  level 5 at 1.8V  -Repeat HST 05/13/2022 with AHI 9/hr -Office visit 06/07/2022 her device was interrogated.  She was at level 5 which was 1.8 V and testing at this voltage showed prominent left-sided tongue deviation.  Suspected that this may be distorting anatomy and causing her  to actually have more apneas and resulting in more snoring.  Her  device was reprogrammed after testing her tongue motion at multiple levels.  1.4 V showed good tongue motion.  Her device was reprogrammed with a range of 1.2 to 1.6 V.  Her optimal level will be level 3 which is 1.4 V. -OV  07/19/21 she was doing well and had lost 9lbs.  She was continued on her current setting of level 3 which is 1.4V. -OV 02/27/2023:  doing well on level 4 (1.5V)>>no changes made -OV today 805/02/2023: Patient is doing well currently on level 4 (1.5 V).  No changes were made to programming today   #Hypertension - BP is controlled on exam today - Continue prescription drug management with Dyazide 37.5-25 mg daily with as needed refills -I have personally reviewed and interpreted outside labs performed by patient's PCP which showed ***  #Nonobstructive CAD #Hyperlipidemia - Cath 07/07/2022 showing 20% proximal to mid LAD and 40% proximal RCA with elevated LVEDP consistent with diastolic dysfunction.  - 2D echo 07/07/2022 showed normal LV function with normal diastolic function - She has not had any anginal symptoms - Continue aspirin  81 mg daily, Crestor  40 mg daily with as needed refills -I have personally reviewed and interpreted outside labs performed by patient's PCP which showed ***   Medication Adjustments/Labs and Tests Ordered: Current medicines are reviewed at length with the patient today.  Concerns regarding medicines are outlined above.   Tests  Ordered: No orders of the defined types were placed in this encounter.   Medication Changes: No orders of the defined types were placed in this encounter.   Follow Up:  In Person in 4 week(s) with Inspire Rep  Signed, Gaylyn Keas, MD  10/25/2023 2:50 PM    [facility]

## 2023-10-28 ENCOUNTER — Ambulatory Visit: Attending: Cardiology | Admitting: Cardiology

## 2023-10-28 ENCOUNTER — Encounter: Payer: Self-pay | Admitting: Cardiology

## 2023-10-28 VITALS — BP 137/84 | HR 84 | Ht 62.0 in | Wt 194.0 lb

## 2023-10-28 DIAGNOSIS — I25118 Atherosclerotic heart disease of native coronary artery with other forms of angina pectoris: Secondary | ICD-10-CM | POA: Diagnosis not present

## 2023-10-28 DIAGNOSIS — E785 Hyperlipidemia, unspecified: Secondary | ICD-10-CM | POA: Diagnosis not present

## 2023-10-28 DIAGNOSIS — I10 Essential (primary) hypertension: Secondary | ICD-10-CM | POA: Diagnosis not present

## 2023-10-28 DIAGNOSIS — G4733 Obstructive sleep apnea (adult) (pediatric): Secondary | ICD-10-CM

## 2023-10-28 DIAGNOSIS — Z79899 Other long term (current) drug therapy: Secondary | ICD-10-CM

## 2023-10-28 NOTE — Addendum Note (Signed)
 Addended by: Cherylyn Cos on: 10/28/2023 03:25 PM   Modules accepted: Orders

## 2023-10-28 NOTE — Patient Instructions (Signed)
 Medication Instructions:  Your physician recommends that you continue on your current medications as directed. Please refer to the Current Medication list given to you today.  *If you need a refill on your cardiac medications before your next appointment, please call your pharmacy*  Lab Work: Please complete a CMET and a FASTING lipid panel today in our lab before you leave.  If you have labs (blood work) drawn today and your tests are completely normal, you will receive your results only by: MyChart Message (if you have MyChart) OR A paper copy in the mail If you have any lab test that is abnormal or we need to change your treatment, we will call you to review the results.  Testing/Procedures: None.  Follow-Up: At [facility], you and your health needs are our priority.  As part of our continuing mission to provide you with exceptional heart care, our providers are all part of one team.  This team includes your primary Cardiologist (physician) and Advanced Practice Providers or APPs (Physician Assistants and Nurse Practitioners) who all work together to provide you with the care you need, when you need it.  Your next appointment will be a virtual MyChart appointment in:   4 week(s)  And it will be with Provider:   Dr. Gaylyn Keas, MD

## 2023-10-28 NOTE — Progress Notes (Signed)
 Date:  10/28/2023   ID:  [name] [name], DOB [birth year], MRN [id number] The patient was identified using 2 identifiers.  PCP:  Donley Furth, MD   [facility] Providers Cardiologist:  Gaylyn Keas, MD     Evaluation Performed:  Follow-Up Visit  Chief Complaint:  OSA  History of Present Illness:    [name] [name] is a 68 y.o. female with a history of ADHD, bipolar disorder, hypertension, GERD, hyperlipidemia, obesity.  She has a hx of OSA on CPAP therapy.  Unfortunately she was intolerant to CPAP and requested ENT evaluation of  Inspire. Device.  She underwent hypoglossal nerve stimulator implant by Dr. Tellis Feathers on 03/25/2019.     She was seen back on 05/01/2019 for device activation.  She was then seen in the office on 10/06/2019 and was complaining of feeling very sleepy and felt that her device need to be reprogrammed.  She was having a lot of daytime sleepiness as well as snoring.  Nerve stimulation was performed and her amplitude was increased from 1.7 to 2.5 V.  Her control range was changed to 2.3 to 3.3 V.  A few weeks later she had an in lab study done 04/30/2020 showed which showed no significant obstructive sleep apnea using her inspire device.     I then saw her back 07/31/2021 and she was doing great with her inspire device and was on level 5 which was 2.7 V.  She was feeling rested when she get up in the morning and had no daytime sleepiness.  She had no tongue sensitivity and her husband said she was very lightly snoring.  At that time an Itamar sleep study was ordered which showed moderate obstructive sleep apnea with an AHI of 17.9/h and O2 sats as low as 81%.   She is now brought back into the office for further evaluation due to an increase in her AHI.  At last office visit she was had really struggling with her inspire device and has not been able to sleep at night.  She was having a severe sore throat when she would wake up up in the morning and felt like her  tongue was all the way out of her mouth all night and very painful in the morning.   At that office visit  interrogation of her device was performed and showed that her thresholds had decreased to 1 V and it was likely that she had had a decrease in stimulation threshold over time due to healing and did not require a high pacing threshold any longer and was resulting in overstimulation of the hypoglossal nerve which was changing her anatomy  resulting in more apneas. Her parameters were changed with the stimulation range of 1.4 V to 1.8 V.  Her goal was to get to level 2 or 3 which would be 1.5 to 1.6 V.   She underwent Itamar sleep study 11/23/21 to make sure that her apneas were stable on current inspire settings.  This showed mild obstructive sleep apnea with an AHI 7.3/h   She was seen again 11.14.2023 due to recurrent snoring again even when she was on her side.  She had gained over 15 lbs since May.  She was on level 5 which is 1.8 volts.  She also had been more sleepy than usual.   She has been placed on Gabapentin  that has caused weight gain. She also was just dx with ILD and emphysema. The gabapentin  makes her  sleepy as well. Her voltage was increased but that resulted in significant protrusion of her tongue with angulation to the left.  She was kept at level 5 (1.8V).  She underwent Itamar HST which showed very mild OSA with an AHI of 9/hr and no central events.    Office visit today 06/07/2022 her device was interrogated.  She was at level 5 which was 1.8 V and testing at this voltage showed prominent left-sided tongue deviation. Suspected that this was distorting anatomy and causing her  to actually have more apneas and resulting in more snoring.  Her device was reprogrammed after testing her tongue motion at multiple levels.  1.4 V showed good tongue motion.  Her device was reprogrammed with a range of 1.2 to 1.6 V.  Her optimal level was level 3 which is 1.4 V.  OV 07/2022 doing well.     Office visit 03/08/2023 doing well and had device interrogation that showed left-sided tongue deviation.  Suspected that this may be distorting anatomy and causing her  to actually have more apneas and resulting in more snoring.  Her device was reprogrammed after testing her tongue motion at multiple levels.  1.4 V showed good tongue motion.  Her device was reprogrammed with a range of 1.2 to 1.6 V.  Her optimal level will be level 3 which is 1.4 V.  -OV  07/19/21 she was doing well and had lost 9lbs.  She was continued on her current setting of level 3 which is 1.4V.  -OV 02/27/2023:  Patient doing well and was on level 4 (1.5 V).  No changes were made to programming   -Office visit today 10/28/2023: Patient has been having difficulty with memory and suffered a fall and has post concussive syndrome and is very forgetful.  Because of this her sleep has been very erratic and she sometimes does not use the device and sometimes forgets to turn it off.   Currently on level 4 (1.5 V).     Past Medical History:  Diagnosis Date   Allergic rhinitis 01/23/2008   Angina pectoris 06/06/2016   Arthritis    Attention deficit hyperactivity disorder (ADHD) 02/07/2016   Back pain, lumbar 01/21/2009   Bell's palsy 02/06/2008   Bipolar disorder 12/13/2009   Chronic idiopathic constipation    Dizziness 04/16/2008   Edema, leg 01/21/2009   Epigastric pain    Essential hypertension 01/23/2008   Gastro-esophageal reflux disease without esophagitis 01/23/2008   Generalized anxiety disorder 01/23/2008   Headache 04/15/2007   Hidradenitis 10/04/2008   History of colonic polyps    Hyperlipidemia    Hypokalemia 07/23/2018   Insomnia 01/23/2008   Major depressive disorder 01/23/2008   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   Neck pain 05/06/2017   Obesity (BMI 30-39.9) 11/10/2015   OSA (obstructive sleep apnea) 04/15/2007   inspire implant; does not always remember to turn device on   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/2008    Qualifier: Diagnosis of  By: Alyne Babinski MD, Lenis Quin    PUD (peptic ulcer disease) 06/06/2016   Unspecified urinary incontinence 01/23/2008   Wears dentures    Wears glasses    Past Surgical History:  Procedure Laterality Date   ABDOMINAL HYSTERECTOMY     ANTERIOR FUSION CERVICAL SPINE  2006   Dr. Lamon Pillow   BREAST BIOPSY Right 1981   benign   BREAST EXCISIONAL BIOPSY Right 1981   benign   scar does not show   BREAST SURGERY Bilateral 12/11/2012   reductions per Dr. Willadean Hark  in [facility]    CATARACT EXTRACTION, BILATERAL Bilateral last summer of 2018   at [facility]   COLONOSCOPY  03/07/2021   per Dr. Tova Fresh, adenomatous polyps, repeat in 7 yrs   DRUG INDUCED ENDOSCOPY N/A 01/30/2019   Procedure: DRUG INDUCED ENDOSCOPY;  Surgeon: Virgina Grills, MD;  Location: [facility];  Service: ENT;  Laterality: N/A;   ESOPHAGOGASTRODUODENOSCOPY  04/23/2011   per Dr. Tova Fresh, normal    EYE SURGERY  06/18/2016   L eyelid    IMPLANTATION OF HYPOGLOSSAL NERVE STIMULATOR Right 03/25/2019   Procedure: IMPLANTATION OF HYPOGLOSSAL NERVE STIMULATOR;  Surgeon: Virgina Grills, MD;  Location: [facility] OR;  Service: ENT;  Laterality: Right;   LEFT HEART CATH AND CORONARY ANGIOGRAPHY N/A 07/05/2022   Procedure: LEFT HEART CATH AND CORONARY ANGIOGRAPHY;  Surgeon: Swaziland, Peter M, MD;  Location: [facility] INVASIVE CV LAB;  Service: Cardiovascular;  Laterality: N/A;   LUMBAR DISC SURGERY  2003   Dr. Lamon Pillow   MULTIPLE TOOTH EXTRACTIONS     ORIF WRIST FRACTURE Right 08/29/2018   REDUCTION MAMMAPLASTY Bilateral 2013   SPLIT NIGHT STUDY  11/11/2015   UVULOPALATOPHARYNGOPLASTY     VAGINAL HYSTERECTOMY  05/1995   secondary to fibroids.     Current Meds  Medication Sig   acyclovir  (ZOVIRAX ) 400 MG tablet TAKE 1 TABLET BY MOUTH 4 TIMES A DAY AS NEEDED   albuterol  (PROVENTIL ) (2.5 MG/3ML) 0.083% nebulizer solution Take 3 mLs (2.5 mg total) by nebulization every 6 (six) hours as needed for wheezing or shortness of  breath.   albuterol  (VENTOLIN  HFA) 108 (90 Base) MCG/ACT inhaler Inhale 1-2 puffs into the lungs every 6 (six) hours as needed.   alprazolam  (XANAX ) 2 MG tablet Take 1 tablet (2 mg total) by mouth 3 (three) times daily as needed for anxiety.   Ascorbic Acid (VITAMIN C PO) Take by mouth.   aspirin  EC 81 MG tablet Take 81 mg by mouth daily. Swallow whole.   Aspirin -Acetaminophen -Caffeine (GOODYS EXTRA STRENGTH PO) Take by mouth.   B Complex-C-Folic Acid (B-COMPLEX/VITAMIN C PO) Take 1 tablet by mouth daily.   Cholecalciferol (VITAMIN D3) 125 MCG (5000 UT) CAPS Take 5,000 Units by mouth once a week.   Fluticasone -Umeclidin-Vilant (TRELEGY ELLIPTA ) 100-62.5-25 MCG/ACT AEPB Inhale 1 puff into the lungs daily.   hydrocortisone  (ANUSOL -HC) 2.5 % rectal cream PLACE 1 APPLICATION RECTALLY 2 TIMES DAILY.   Magnesium 500 MG CAPS Take 500 mg by mouth daily.   MAGNESIUM PO Take by mouth.   Multiple Vitamins-Minerals (MULTIVITAMIN WITH MINERALS) tablet Take 1 tablet by mouth daily.   omeprazole  (PRILOSEC) 20 MG capsule TAKE 1 CAPSULE BY MOUTH 2 TIMES DAILY BEFORE A MEAL.   Oxycodone  HCl 10 MG TABS Take 1 tablet (10 mg total) by mouth every 6 (six) hours as needed (pain).   polyethylene glycol (MIRALAX  / GLYCOLAX ) 17 g packet Use one packet daily (Patient taking differently: Take 17 g by mouth 2 (two) times daily as needed for moderate constipation.)   potassium chloride  (KLOR-CON ) 10 MEQ tablet TAKE 1 TABLET (10 MEQ TOTAL) BY MOUTH DAILY.   rosuvastatin  (CRESTOR ) 40 MG tablet TAKE 1 TABLET BY MOUTH DAILY.   triamterene-hydrochlorothiazide (DYAZIDE) 37.5-25 MG capsule Take 1 capsule by mouth every morning.     Allergies:   Sulfonamide derivatives, Flexeril  [cyclobenzaprine ], Hydromorphone , Morphine  and codeine, and Sulfamethoxazole   Social History   Tobacco Use   Smoking status: Some Days    Current packs/day: 0.50    Average packs/day: 0.5  packs/day for 0.4 years (0.2 ttl pk-yrs)    Types: Cigarettes     Start date: 12/1990    Last attempt to quit: 12/2020    Passive exposure: Past (occasional)   Smokeless tobacco: Never  Vaping Use   Vaping status: Former  Substance Use Topics   Alcohol use: Not Currently   Drug use: No     Family Hx: The patient's family history includes Alcohol abuse in an other family member; Arthritis in an other family member; Asthma in her father; Coronary artery disease in her brother, sister, and another family member; Dementia in her mother; Depression in an other family member; Diabetes in an other family member; Hyperlipidemia in her brother, mother, sister, sister, and another family member; Hypertension in her brother, sister, sister, and another family member; Sudden death in an other family member; Thyroid  disease in her sister.  ROS:   Please see the history of present illness.     All other systems reviewed and are negative.   Prior Sleep studies:   The following studies were reviewed today:  none  Labs/Other Tests and Data Reviewed:     Recent Labs: 01/23/2023: ALT 18; BNP 4.8; TSH 2.190 06/02/2023: BUN 16; Creatinine, Ser 1.11; Hemoglobin 13.4; Platelets 212; Potassium 3.3; Sodium 138   Wt Readings from Last 3 Encounters:  10/28/23 194 lb (88 kg)  10/25/23 191 lb 6.4 oz (86.8 kg)  09/12/23 191 lb 9.6 oz (86.9 kg)     Risk Assessment/Calculations:      Objective:    Vital Signs:  BP 137/84   Pulse 84   Ht 5\' 2"  (1.575 m)   Wt 194 lb (88 kg)   SpO2 95%   BMI 35.48 kg/m   GEN: Well nourished, well developed in no acute distress HEENT: Normal NECK: No JVD; No carotid bruits LYMPHATICS: No lymphadenopathy CARDIAC:RRR, no murmurs, rubs, gallops RESPIRATORY:  Clear to auscultation without rales, wheezing or rhonchi  ABDOMEN: Soft, non-tender, non-distended MUSCULOSKELETAL:  No edema; No deformity  SKIN: Warm and dry NEUROLOGIC:  Alert and oriented x 3 PSYCHIATRIC:  Normal affect  ASSESSMENT & PLAN:    #OSA -intolerant to  CPAP therapy -s/p hypoglossal nerve stimulator -As stated above her stimulation range was increased to 2.3-3.3V at office visit 09/2019 with repeat sleep study in November 2021 showing no residual sleep apnea -Itamar sleep study was done 08/08/2021 for follow-up which showed moderate obstructive sleep apnea with an AHI of 17.9/h -At last OV she was having a lot of difficulty with sore throat in the morning as well as feeling like her tongue is out of her mouth the hallway all night and has not been sleeping well at all -Her thresholds had decreased to 1 V and it was felt that she had had a decrease in stimulation threshold over time due to healing and did not require a high pacing threshold any longef resulting in overstimulation of the hypoglossal nerve which is changing her anatomy and was resulting in more apneas. -Her parameters were changed with the stimulation range of 1.4 V to 1.8 V with a  goal  to get to level 2 or 3 which would be 1.5 to 1.6 V. -She is doing great on the current settings and is on level 5 with complete resolution of her mouth dryness and sore throat and has no daytime sleepiness -A Itamar home sleep study in June 2023 showed residual mild obstructive sleep apnea on level 5 at 1.8V with an AHI of  7.3/h but down from 17/hour at her baseline study -OV  05/01/2022 she had increased snoring on her side and had gained 15lbs since May and suspect that she is having more apneas>>increased voltage was tried in office but had significant protrusion with angulation to the left >> kept at  level 5 at 1.8V  -Repeat HST 05/13/2022 with AHI 9/hr -Office visit 06/07/2022 her device was interrogated.  She was at level 5 which was 1.8 V and testing at this voltage showed left-sided tongue deviation.  Suspected that this may be distorting anatomy and causing her  to actually have more apneas and resulting in more snoring.  Her device was reprogrammed after testing her tongue motion at multiple  levels.  1.4 V showed good tongue motion.  Her device was reprogrammed with a range of 1.2 to 1.6 V.  Her optimal level will be level 3 which is 1.4 V. -OV  07/19/21 she was doing well and had lost 9lbs.  She was continued on her current setting of level 3 which is 1.4V. -OV 02/27/2023:  doing well on level 4 (1.5V)>>no changes made -Office visit today 10/28/2023 she has not been using her device as much because she has been having problems with getting to sleep. She recently had a fall 4 months ago and now has post concussive syndrome and is very forgetful and her sleeping is very erratic. At other times she has been forgetting to turn the device off and would be on for 24 hours. Currently she is on level 4 (1.5V).   Device interrogation done today showing poor tongue protrusion at 1.5V Testing showed good tongue protrusion at 2.2V Her new range has been changed to 1.9 to 2.5V and will continue on Level 4 (2.2V) Encouraged her to try to use her device nightly -will plan 4 week virtual visit checkin and if doing well then will get another home sleep study   #Hypertension - BP controlled on exam today - Continue Dyazide 37.5/25 mg daily with as needed refills - Check BMET  #Nonobstructive CAD #Hyperlipidemia - Cath 07/07/2022 showing 20% proximal to mid LAD and 40% proximal RCA with elevated LVEDP consistent with diastolic dysfunction.  - 2D echo 07/07/2022 showed normal LV function with normal diastolic function - She has not had any symptoms since I saw her last continue aspirin  - continue ASA 81 mg daily and Crestor  40 mg daily with as needed refills - Check FLP and ALT   Medication Adjustments/Labs and Tests Ordered: Current medicines are reviewed at length with the patient today.  Concerns regarding medicines are outlined above.   Tests Ordered: No orders of the defined types were placed in this encounter.   Medication Changes: No orders of the defined types were placed in this  encounter.   Follow Up:  In Person in 4 week(s) with Inspire Rep  Signed, Gaylyn Keas, MD  10/25/2023 2:51 PM    [facility]   Medication Adjustments/Labs and Tests Ordered: Current medicines are reviewed at length with the patient today.  Concerns regarding medicines are outlined above.   Tests Ordered: No orders of the defined types were placed in this encounter.   Medication Changes: No orders of the defined types were placed in this encounter.   Follow Up:  In Person in 4 week(s) with Inspire Rep  Signed, Gaylyn Keas, MD  10/28/2023 3:13 PM    [facility]

## 2023-10-30 ENCOUNTER — Ambulatory Visit: Payer: Self-pay | Admitting: *Deleted

## 2023-10-30 DIAGNOSIS — Z79899 Other long term (current) drug therapy: Secondary | ICD-10-CM

## 2023-10-30 DIAGNOSIS — E785 Hyperlipidemia, unspecified: Secondary | ICD-10-CM

## 2023-10-30 LAB — COMPREHENSIVE METABOLIC PANEL WITH GFR
ALT: 16 IU/L (ref 0–32)
AST: 35 IU/L (ref 0–40)
Albumin: 4.6 g/dL (ref 3.9–4.9)
Alkaline Phosphatase: 70 IU/L (ref 44–121)
BUN/Creatinine Ratio: 15 (ref 12–28)
BUN: 13 mg/dL (ref 8–27)
Bilirubin Total: <0.2 mg/dL (ref 0.0–1.2)
CO2: 21 mmol/L (ref 20–29)
Calcium: 9.9 mg/dL (ref 8.7–10.3)
Chloride: 100 mmol/L (ref 96–106)
Creatinine, Ser: 0.84 mg/dL (ref 0.57–1.00)
Globulin, Total: 2.8 g/dL (ref 1.5–4.5)
Glucose: 83 mg/dL (ref 70–99)
Potassium: 4.1 mmol/L (ref 3.5–5.2)
Sodium: 143 mmol/L (ref 134–144)
Total Protein: 7.4 g/dL (ref 6.0–8.5)
eGFR: 76 mL/min/{1.73_m2} (ref >59)

## 2023-10-30 LAB — LIPID PANEL
Chol/HDL Ratio: 3.6 ratio (ref 0.0–4.4)
Cholesterol, Total: 169 mg/dL (ref 100–199)
HDL: 47 mg/dL (ref >39)
LDL Chol Calc (NIH): 95 mg/dL (ref 0–99)
Triglycerides: 157 mg/dL — ABNORMAL HIGH (ref 0–149)
VLDL Cholesterol Cal: 27 mg/dL (ref 5–40)

## 2023-10-31 MED ORDER — EZETIMIBE 10 MG PO TABS: 10.0000 mg | ORAL_TABLET | Freq: Every day | ORAL | 3 refills | Status: AC

## 2023-10-31 NOTE — Telephone Encounter (Signed)
 Call to patient to discuss LDL not at goal - Patient agrees to add Zetia  10mg  daily and repeat FLP and ALT In 8 weeks. Orders placed.

## 2023-10-31 NOTE — Telephone Encounter (Signed)
-----   Message from Gaylyn Keas sent at 10/30/2023  8:38 PM EDT ----- LDL not at goal - add Zetia  10mg  daily and repeat FLP and ALT In 8 weeks

## 2023-11-01 DIAGNOSIS — F332 Major depressive disorder, recurrent severe without psychotic features: Secondary | ICD-10-CM | POA: Diagnosis not present

## 2023-11-15 ENCOUNTER — Other Ambulatory Visit: Payer: Self-pay | Admitting: Family Medicine

## 2023-11-15 NOTE — Telephone Encounter (Signed)
 Copied from CRM [id number]. Topic: Clinical - Medication Refill >> Nov 15, 2023 10:16 AM Kita Perish H wrote: Medication: Oxycodone  HCl 10 MG TABS  Has the patient contacted their pharmacy? Yes, states controlled substance reach out to office (Agent: If no, request that the patient contact the pharmacy for the refill. If patient does not wish to contact the pharmacy document the reason why and proceed with request.) (Agent: If yes, when and what did the pharmacy advise?)  This is the patient's preferred pharmacy:  [facility] - [place], [place] - [address] [place] [address] Phone: [phone] Fax: [phone]  Is this the correct pharmacy for this prescription? Yes If no, delete pharmacy and type the correct one.   Has the prescription been filled recently? No  Is the patient out of the medication? Yes  Has the patient been seen for an appointment in the last year OR does the patient have an upcoming appointment? Yes  Can we respond through MyChart? No  Agent: Please be advised that Rx refills may take up to 3 business days. We ask that you follow-up with your pharmacy.

## 2023-11-15 NOTE — Telephone Encounter (Signed)
 Pt has a virtual visit with Dr Alyne Babinski on 11/19/23

## 2023-11-15 NOTE — Telephone Encounter (Signed)
 FYI

## 2023-11-19 ENCOUNTER — Telehealth (INDEPENDENT_AMBULATORY_CARE_PROVIDER_SITE_OTHER): Admitting: Family Medicine

## 2023-11-19 ENCOUNTER — Encounter: Payer: Self-pay | Admitting: Family Medicine

## 2023-11-19 DIAGNOSIS — M5442 Lumbago with sciatica, left side: Secondary | ICD-10-CM | POA: Diagnosis not present

## 2023-11-19 DIAGNOSIS — G8929 Other chronic pain: Secondary | ICD-10-CM | POA: Diagnosis not present

## 2023-11-19 DIAGNOSIS — M5441 Lumbago with sciatica, right side: Secondary | ICD-10-CM

## 2023-11-19 MED ORDER — OXYCODONE HCL 10 MG PO TABS: 10.0000 mg | ORAL_TABLET | Freq: Four times a day (QID) | ORAL | 0 refills | Status: DC | PRN

## 2023-11-19 NOTE — Progress Notes (Signed)
 Subjective:    Patient ID: [name] [name], female    DOB: 1956-04-02, 68 y.o.   MRN: [id number]  HPI Virtual Visit via Video Note  I connected with the patient on 11/19/23 at 11:00 AM EDT by a video enabled telemedicine application and verified that I am speaking with the correct person using two identifiers.  Location patient: home Location provider:work or home office Persons participating in the virtual visit: patient, provider  I discussed the limitations of evaluation and management by telemedicine and the availability of in person appointments. The patient expressed understanding and agreed to proceed.   HPI: Here for pain management. She is doing well.    ROS: See pertinent positives and negatives per HPI.  Past Medical History:  Diagnosis Date   Allergic rhinitis 01/23/2008   Angina pectoris 06/06/2016   Arthritis    Attention deficit hyperactivity disorder (ADHD) 02/07/2016   Back pain, lumbar 01/21/2009   Bell's palsy 02/06/2008   Bipolar disorder 12/13/2009   Chronic idiopathic constipation    Dizziness 04/16/2008   Edema, leg 01/21/2009   Epigastric pain    Essential hypertension 01/23/2008   Gastro-esophageal reflux disease without esophagitis 01/23/2008   Generalized anxiety disorder 01/23/2008   Headache 04/15/2007   Hidradenitis 10/04/2008   History of colonic polyps    Hyperlipidemia    Hypokalemia 07/23/2018   Insomnia 01/23/2008   Major depressive disorder 01/23/2008   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   Neck pain 05/06/2017   Obesity (BMI 30-39.9) 11/10/2015   OSA (obstructive sleep apnea) 04/15/2007   inspire implant; does not always remember to turn device on   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/2008   Qualifier: Diagnosis of  By: Alyne Babinski MD, Lenis Quin    PUD (peptic ulcer disease) 06/06/2016   Unspecified urinary incontinence 01/23/2008   Wears dentures    Wears glasses     Past Surgical History:  Procedure Laterality Date   ABDOMINAL HYSTERECTOMY      ANTERIOR FUSION CERVICAL SPINE  2006   Dr. Lamon Pillow   BREAST BIOPSY Right 1981   benign   BREAST EXCISIONAL BIOPSY Right 1981   benign   scar does not show   BREAST SURGERY Bilateral 12/11/2012   reductions per Dr. Willadean Hark in [facility]    CATARACT EXTRACTION, BILATERAL Bilateral last summer of 2018   at [facility]   COLONOSCOPY  03/07/2021   per Dr. Tova Fresh, adenomatous polyps, repeat in 7 yrs   DRUG INDUCED ENDOSCOPY N/A 01/30/2019   Procedure: DRUG INDUCED ENDOSCOPY;  Surgeon: Virgina Grills, MD;  Location: [facility];  Service: ENT;  Laterality: N/A;   ESOPHAGOGASTRODUODENOSCOPY  04/23/2011   per Dr. Tova Fresh, normal    EYE SURGERY  06/18/2016   L eyelid    IMPLANTATION OF HYPOGLOSSAL NERVE STIMULATOR Right 03/25/2019   Procedure: IMPLANTATION OF HYPOGLOSSAL NERVE STIMULATOR;  Surgeon: Virgina Grills, MD;  Location: [facility] OR;  Service: ENT;  Laterality: Right;   LEFT HEART CATH AND CORONARY ANGIOGRAPHY N/A 07/05/2022   Procedure: LEFT HEART CATH AND CORONARY ANGIOGRAPHY;  Surgeon: Swaziland, Peter M, MD;  Location: [facility] INVASIVE CV LAB;  Service: Cardiovascular;  Laterality: N/A;   LUMBAR DISC SURGERY  2003   Dr. Lamon Pillow   MULTIPLE TOOTH EXTRACTIONS     ORIF WRIST FRACTURE Right 08/29/2018   REDUCTION MAMMAPLASTY Bilateral 2013   SPLIT NIGHT STUDY  11/11/2015   UVULOPALATOPHARYNGOPLASTY     VAGINAL HYSTERECTOMY  05/1995   secondary to fibroids.    Family History  Problem Relation Age of Onset   Dementia Mother    Hyperlipidemia Mother    Asthma Father    Hypertension Sister    Hyperlipidemia Sister    Coronary artery disease Sister    Thyroid  disease Sister    Hypertension Sister    Hyperlipidemia Sister    Hypertension Brother    Hyperlipidemia Brother    Coronary artery disease Brother    Alcohol abuse Other        fhx   Arthritis Other        fhx   Coronary artery disease Other        fhx   Depression Other        fhx   Diabetes Other        fhx    Hyperlipidemia Other        fhx   Hypertension Other        fhx   Sudden death Other        fhx     Current Outpatient Medications:    acyclovir  (ZOVIRAX ) 400 MG tablet, TAKE 1 TABLET BY MOUTH 4 TIMES A DAY AS NEEDED, Disp: 120 tablet, Rfl: 5   albuterol  (PROVENTIL ) (2.5 MG/3ML) 0.083% nebulizer solution, Take 3 mLs (2.5 mg total) by nebulization every 6 (six) hours as needed for wheezing or shortness of breath., Disp: 75 mL, Rfl: 12   albuterol  (VENTOLIN  HFA) 108 (90 Base) MCG/ACT inhaler, Inhale 1-2 puffs into the lungs every 6 (six) hours as needed., Disp: 8 g, Rfl: 2   alprazolam  (XANAX ) 2 MG tablet, Take 1 tablet (2 mg total) by mouth 3 (three) times daily as needed for anxiety., Disp: 90 tablet, Rfl: 5   Ascorbic Acid (VITAMIN C PO), Take by mouth., Disp: , Rfl:    aspirin  EC 81 MG tablet, Take 81 mg by mouth daily. Swallow whole., Disp: , Rfl:    Aspirin -Acetaminophen -Caffeine (GOODYS EXTRA STRENGTH PO), Take by mouth., Disp: , Rfl:    B Complex-C-Folic Acid (B-COMPLEX/VITAMIN C PO), Take 1 tablet by mouth daily., Disp: , Rfl:    Cholecalciferol (VITAMIN D3) 125 MCG (5000 UT) CAPS, Take 5,000 Units by mouth once a week., Disp: , Rfl:    ezetimibe  (ZETIA ) 10 MG tablet, Take 1 tablet (10 mg total) by mouth daily., Disp: 90 tablet, Rfl: 3   Fluticasone -Umeclidin-Vilant (TRELEGY ELLIPTA ) 100-62.5-25 MCG/ACT AEPB, Inhale 1 puff into the lungs daily., Disp: 60 each, Rfl: 5   hydrocortisone  (ANUSOL -HC) 2.5 % rectal cream, PLACE 1 APPLICATION RECTALLY 2 TIMES DAILY., Disp: 30 g, Rfl: 1   Magnesium 500 MG CAPS, Take 500 mg by mouth daily., Disp: , Rfl:    MAGNESIUM PO, Take by mouth., Disp: , Rfl:    Multiple Vitamins-Minerals (MULTIVITAMIN WITH MINERALS) tablet, Take 1 tablet by mouth daily., Disp: , Rfl:    omeprazole  (PRILOSEC) 20 MG capsule, TAKE 1 CAPSULE BY MOUTH 2 TIMES DAILY BEFORE A MEAL., Disp: 180 capsule, Rfl: 3   Oxycodone  HCl 10 MG TABS, Take 1 tablet (10 mg total) by mouth  every 6 (six) hours as needed (pain)., Disp: 120 tablet, Rfl: 0   polyethylene glycol (MIRALAX  / GLYCOLAX ) 17 g packet, Use one packet daily (Patient taking differently: Take 17 g by mouth 2 (two) times daily as needed for moderate constipation.), Disp: 90 each, Rfl: 3   potassium chloride  (KLOR-CON ) 10 MEQ tablet, TAKE 1 TABLET (10 MEQ TOTAL) BY MOUTH DAILY., Disp: 90 tablet, Rfl: 3   rosuvastatin  (CRESTOR ) 40 MG tablet,  TAKE 1 TABLET BY MOUTH DAILY., Disp: 90 tablet, Rfl: 3   triamterene-hydrochlorothiazide (DYAZIDE) 37.5-25 MG capsule, Take 1 capsule by mouth every morning., Disp: , Rfl:   EXAM:  VITALS per patient if applicable:  GENERAL: alert, oriented, appears well and in no acute distress  HEENT: atraumatic, conjunttiva clear, no obvious abnormalities on inspection of external nose and ears  NECK: normal movements of the head and neck  LUNGS: on inspection no signs of respiratory distress, breathing rate appears normal, no obvious gross SOB, gasping or wheezing  CV: no obvious cyanosis  MS: moves all visible extremities without noticeable abnormality  PSYCH/NEURO: pleasant and cooperative, no obvious depression or anxiety, speech and thought processing grossly intact  ASSESSMENT AND PLAN: Pain management.  Indication for chronic opioid: low back pain Medication and dose: Oxycodone  10 mg  # pills per month: 120 Last UDS date: 08-16-23 Opioid Treatment Agreement signed (Y/N): 10-19-22 Opioid Treatment Agreement last reviewed with patient:  11-19-23 [facility] reviewed this encounter (include red flags): Yes Meds were refilled.  Corita Diego, MD  Discussed the following assessment and plan:  No diagnosis found.     I discussed the assessment and treatment plan with the patient. The patient was provided an opportunity to ask questions and all were answered. The patient agreed with the plan and demonstrated an understanding of the instructions.   The patient was advised to call  back or seek an in-person evaluation if the symptoms worsen or if the condition fails to improve as anticipated.      Review of Systems     Objective:   Physical Exam        Assessment & Plan:

## 2023-11-20 ENCOUNTER — Emergency Department (HOSPITAL_COMMUNITY)

## 2023-11-20 ENCOUNTER — Ambulatory Visit: Payer: Self-pay

## 2023-11-20 ENCOUNTER — Other Ambulatory Visit: Payer: Self-pay

## 2023-11-20 ENCOUNTER — Encounter (HOSPITAL_COMMUNITY): Payer: Self-pay

## 2023-11-20 ENCOUNTER — Emergency Department (HOSPITAL_COMMUNITY)
Admission: EM | Admit: 2023-11-20 | Discharge: 2023-11-22 | Disposition: A | Discharge status: Other Acute Inpatient Hospital | Attending: Emergency Medicine | Admitting: Emergency Medicine

## 2023-11-20 DIAGNOSIS — T402X2A Poisoning by other opioids, intentional self-harm, initial encounter: Secondary | ICD-10-CM | POA: Diagnosis not present

## 2023-11-20 DIAGNOSIS — R4182 Altered mental status, unspecified: Secondary | ICD-10-CM | POA: Insufficient documentation

## 2023-11-20 DIAGNOSIS — Z79899 Other long term (current) drug therapy: Secondary | ICD-10-CM | POA: Insufficient documentation

## 2023-11-20 DIAGNOSIS — I10 Essential (primary) hypertension: Secondary | ICD-10-CM | POA: Diagnosis not present

## 2023-11-20 DIAGNOSIS — W19XXXA Unspecified fall, initial encounter: Secondary | ICD-10-CM | POA: Insufficient documentation

## 2023-11-20 DIAGNOSIS — R410 Disorientation, unspecified: Secondary | ICD-10-CM | POA: Diagnosis not present

## 2023-11-20 DIAGNOSIS — X838XXA Intentional self-harm by other specified means, initial encounter: Secondary | ICD-10-CM | POA: Diagnosis not present

## 2023-11-20 DIAGNOSIS — Z7982 Long term (current) use of aspirin: Secondary | ICD-10-CM | POA: Insufficient documentation

## 2023-11-20 DIAGNOSIS — F29 Unspecified psychosis not due to a substance or known physiological condition: Secondary | ICD-10-CM | POA: Insufficient documentation

## 2023-11-20 DIAGNOSIS — T40602A Poisoning by unspecified narcotics, intentional self-harm, initial encounter: Secondary | ICD-10-CM

## 2023-11-20 DIAGNOSIS — F319 Bipolar disorder, unspecified: Secondary | ICD-10-CM | POA: Diagnosis present

## 2023-11-20 DIAGNOSIS — F329 Major depressive disorder, single episode, unspecified: Secondary | ICD-10-CM | POA: Diagnosis present

## 2023-11-20 DIAGNOSIS — R0689 Other abnormalities of breathing: Secondary | ICD-10-CM | POA: Diagnosis not present

## 2023-11-20 DIAGNOSIS — R0902 Hypoxemia: Secondary | ICD-10-CM | POA: Diagnosis not present

## 2023-11-20 DIAGNOSIS — F411 Generalized anxiety disorder: Secondary | ICD-10-CM | POA: Diagnosis not present

## 2023-11-20 DIAGNOSIS — R350 Frequency of micturition: Secondary | ICD-10-CM | POA: Diagnosis not present

## 2023-11-20 DIAGNOSIS — R918 Other nonspecific abnormal finding of lung field: Secondary | ICD-10-CM | POA: Diagnosis not present

## 2023-11-20 DIAGNOSIS — Z950 Presence of cardiac pacemaker: Secondary | ICD-10-CM | POA: Diagnosis not present

## 2023-11-20 DIAGNOSIS — R0681 Apnea, not elsewhere classified: Secondary | ICD-10-CM | POA: Diagnosis not present

## 2023-11-20 DIAGNOSIS — R45851 Suicidal ideations: Secondary | ICD-10-CM | POA: Diagnosis not present

## 2023-11-20 DIAGNOSIS — Z043 Encounter for examination and observation following other accident: Secondary | ICD-10-CM | POA: Diagnosis not present

## 2023-11-20 DIAGNOSIS — T50902A Poisoning by unspecified drugs, medicaments and biological substances, intentional self-harm, initial encounter: Secondary | ICD-10-CM | POA: Diagnosis present

## 2023-11-20 DIAGNOSIS — Y92009 Unspecified place in unspecified non-institutional (private) residence as the place of occurrence of the external cause: Secondary | ICD-10-CM | POA: Insufficient documentation

## 2023-11-20 DIAGNOSIS — F314 Bipolar disorder, current episode depressed, severe, without psychotic features: Secondary | ICD-10-CM | POA: Diagnosis not present

## 2023-11-20 LAB — CBC WITH DIFFERENTIAL/PLATELET
Abs Immature Granulocytes: 0.01 10*3/uL (ref 0.00–0.07)
Basophils Absolute: 0 10*3/uL (ref 0.0–0.1)
Basophils Relative: 1 %
Eosinophils Absolute: 0.3 10*3/uL (ref 0.0–0.5)
Eosinophils Relative: 5 %
HCT: 37.3 % (ref 36.0–46.0)
Hemoglobin: 11.1 g/dL — ABNORMAL LOW (ref 12.0–15.0)
Immature Granulocytes: 0 %
Lymphocytes Relative: 26 %
Lymphs Abs: 1.8 10*3/uL (ref 0.7–4.0)
MCH: 28.8 pg (ref 26.0–34.0)
MCHC: 29.8 g/dL — ABNORMAL LOW (ref 30.0–36.0)
MCV: 96.6 fL (ref 80.0–100.0)
Monocytes Absolute: 0.4 10*3/uL (ref 0.1–1.0)
Monocytes Relative: 6 %
Neutro Abs: 4.2 10*3/uL (ref 1.7–7.7)
Neutrophils Relative %: 62 %
Platelets: 182 10*3/uL (ref 150–400)
RBC: 3.86 MIL/uL — ABNORMAL LOW (ref 3.87–5.11)
RDW: 15.1 % (ref 11.5–15.5)
WBC: 6.7 10*3/uL (ref 4.0–10.5)
nRBC: 0 % (ref 0.0–0.2)

## 2023-11-20 LAB — COMPREHENSIVE METABOLIC PANEL WITH GFR
ALT: 15 U/L (ref 0–44)
AST: 33 U/L (ref 15–41)
Albumin: 3.6 g/dL (ref 3.5–5.0)
Alkaline Phosphatase: 53 U/L (ref 38–126)
Anion gap: 8 (ref 5–15)
BUN: 14 mg/dL (ref 8–23)
CO2: 26 mmol/L (ref 22–32)
Calcium: 8.5 mg/dL — ABNORMAL LOW (ref 8.9–10.3)
Chloride: 105 mmol/L (ref 98–111)
Creatinine, Ser: 0.74 mg/dL (ref 0.44–1.00)
GFR, Estimated: >60 mL/min (ref >60)
Glucose, Bld: 96 mg/dL (ref 70–99)
Potassium: 3.6 mmol/L (ref 3.5–5.1)
Sodium: 139 mmol/L (ref 135–145)
Total Bilirubin: 0.4 mg/dL (ref 0.0–1.2)
Total Protein: 7 g/dL (ref 6.5–8.1)

## 2023-11-20 LAB — CK: Total CK: 131 U/L (ref 38–234)

## 2023-11-20 LAB — URINALYSIS, W/ REFLEX TO CULTURE (INFECTION SUSPECTED)
Bilirubin Urine: NEGATIVE
Glucose, UA: NEGATIVE mg/dL
Hgb urine dipstick: NEGATIVE
Ketones, ur: NEGATIVE mg/dL
Leukocytes,Ua: NEGATIVE
Nitrite: POSITIVE — AB
Protein, ur: NEGATIVE mg/dL
Specific Gravity, Urine: 1.01 (ref 1.005–1.030)
pH: 5 (ref 5.0–8.0)

## 2023-11-20 LAB — BLOOD GAS, VENOUS
Acid-Base Excess: 5.3 mmol/L — ABNORMAL HIGH (ref 0.0–2.0)
Bicarbonate: 32.7 mmol/L — ABNORMAL HIGH (ref 20.0–28.0)
O2 Saturation: 33.3 %
Patient temperature: 37
pCO2, Ven: 62 mmHg — ABNORMAL HIGH (ref 44–60)
pH, Ven: 7.33 (ref 7.25–7.43)
pO2, Ven: <31 mmHg — CL (ref 32–45)

## 2023-11-20 LAB — RAPID URINE DRUG SCREEN, HOSP PERFORMED
Amphetamines: NOT DETECTED
Barbiturates: NOT DETECTED
Benzodiazepines: POSITIVE — AB
Cocaine: NOT DETECTED
Opiates: POSITIVE — AB
Tetrahydrocannabinol: NOT DETECTED

## 2023-11-20 LAB — ETHANOL: Alcohol, Ethyl (B): <15 mg/dL (ref <15)

## 2023-11-20 LAB — CBG MONITORING, ED: Glucose-Capillary: 86 mg/dL (ref 70–99)

## 2023-11-20 MED ORDER — EZETIMIBE 10 MG PO TABS
10.0000 mg | ORAL_TABLET | Freq: Every day | ORAL | Status: DC
  Administered 2023-11-21: 10 mg via ORAL
  Filled 2023-11-20: qty 1

## 2023-11-20 MED ORDER — POLYETHYLENE GLYCOL 3350 17 G PO PACK
17.0000 g | PACK | Freq: Two times a day (BID) | ORAL | Status: DC | PRN
Start: 2023-11-20 — End: 2023-11-22

## 2023-11-20 MED ORDER — ASPIRIN 81 MG PO TBEC
81.0000 mg | DELAYED_RELEASE_TABLET | Freq: Every day | ORAL | Status: DC
  Administered 2023-11-21: 81 mg via ORAL
  Filled 2023-11-20: qty 1

## 2023-11-20 MED ORDER — ONDANSETRON HCL 4 MG PO TABS: 4.0000 mg | ORAL_TABLET | Freq: Three times a day (TID) | ORAL | Status: DC | PRN

## 2023-11-20 MED ORDER — ALBUTEROL SULFATE HFA 108 (90 BASE) MCG/ACT IN AERS: 1.0000 | INHALATION_SPRAY | Freq: Four times a day (QID) | RESPIRATORY_TRACT | Status: DC | PRN

## 2023-11-20 MED ORDER — TRIAMTERENE-HCTZ 37.5-25 MG PO CAPS: 1.0000 | ORAL_CAPSULE | Freq: Every morning | ORAL | Status: DC

## 2023-11-20 MED ORDER — MAGNESIUM OXIDE -MG SUPPLEMENT 400 (240 MG) MG PO TABS
400.0000 mg | ORAL_TABLET | Freq: Every day | ORAL | Status: DC
  Administered 2023-11-21: 400 mg via ORAL
  Filled 2023-11-20: qty 1

## 2023-11-20 MED ORDER — BUDESON-GLYCOPYRROL-FORMOTEROL 160-9-4.8 MCG/ACT IN AERO
2.0000 | INHALATION_SPRAY | Freq: Two times a day (BID) | RESPIRATORY_TRACT | Status: DC
  Administered 2023-11-21: 2 via RESPIRATORY_TRACT
  Filled 2023-11-20: qty 5.9

## 2023-11-20 MED ORDER — ACETAMINOPHEN 325 MG PO TABS: 650.0000 mg | ORAL_TABLET | ORAL | Status: DC | PRN

## 2023-11-20 MED ORDER — PANTOPRAZOLE SODIUM 40 MG PO TBEC
40.0000 mg | DELAYED_RELEASE_TABLET | Freq: Every day | ORAL | Status: DC
  Administered 2023-11-21: 40 mg via ORAL
  Filled 2023-11-20: qty 1

## 2023-11-20 MED ORDER — ROSUVASTATIN CALCIUM 20 MG PO TABS
40.0000 mg | ORAL_TABLET | Freq: Every day | ORAL | Status: DC
  Administered 2023-11-21: 40 mg via ORAL
  Filled 2023-11-20: qty 2

## 2023-11-20 MED ORDER — NALOXONE HCL 0.4 MG/ML IJ SOLN
INTRAMUSCULAR | Status: AC
  Administered 2023-11-20: 0.4 mg via INTRAVENOUS
  Filled 2023-11-20: qty 1

## 2023-11-20 MED ORDER — VITAMIN D 25 MCG (1000 UNIT) PO TABS
5000.0000 [IU] | ORAL_TABLET | ORAL | Status: DC
  Administered 2023-11-21: 5000 [IU] via ORAL
  Filled 2023-11-20: qty 5

## 2023-11-20 MED ORDER — NALOXONE HCL 0.4 MG/ML IJ SOLN: 0.4000 mg | Freq: Once | INTRAMUSCULAR | Status: AC

## 2023-11-20 MED ORDER — ONDANSETRON HCL 4 MG/2ML IJ SOLN
4.0000 mg | Freq: Once | INTRAMUSCULAR | Status: AC
  Administered 2023-11-20: 4 mg via INTRAVENOUS
  Filled 2023-11-20: qty 2

## 2023-11-20 NOTE — BH Assessment (Addendum)
 Clinician messaged IRIS asking if there were any updates to when the pt will be assessed.   Clinician awaiting response.   Rosi Converse, MS, [facility], [facility] Triage Specialist [phone]

## 2023-11-20 NOTE — ED Triage Notes (Signed)
 Pt arrives via EMS from home. Daughter found pt laying in the kitchen floor today. Pt was last seen yesterday around 1800-1900 and was her normal self. Pt arrives to the ED very lethargic, but she is AxOx4. Pt reports increased urinary frequency. She is unsure when she took a dose of her oxycodone . EMS administered 1000mL of LR pta. When patient falls asleep during triage, her spo2 will drop in the low 80s. When this occurs, pt will respond to verbal stimuli and oxygen  will improve. She is currently on 3Lnc.   Daughter is at bedside, she reports she could not find her pain medication bottle at the patient's house.

## 2023-11-20 NOTE — ED Notes (Signed)
 CRITICAL VALUE STICKER  CRITICAL VALUE:VBG PO2 <31  RECEIVER (on-site recipient of call):I. Tyreka Henneke  DATE & TIME NOTIFIED: 11/20/23 1458  MESSENGER (representative from lab):Lab  MD NOTIFIED: Drury Geralds  TIME OF NOTIFICATION:1458  RESPONSE:

## 2023-11-20 NOTE — Telephone Encounter (Signed)
 I agree, she should call EMS

## 2023-11-20 NOTE — ED Notes (Signed)
 Daughter informed this RN that she asked the patient if she took too many pain meds. Daughter states pt response was, "Yes I took too many because I don't want to be here". This RN informed Dr. Drury Geralds

## 2023-11-20 NOTE — Telephone Encounter (Signed)
 Noted pt went to the ED per pt chart

## 2023-11-20 NOTE — ED Provider Notes (Signed)
 Patient seen after prior ED provider.  Patient's mentation is improving.  She is medically clear at this time for TTS/psychiatric evaluation.     Burnette Carte, MD 11/20/23 [phone]

## 2023-11-20 NOTE — ED Notes (Signed)
 Pt changed into scrubs. She gave her belongings to her daughter, clothes, 1 phone, 1 bracelet and 2 rings. Security to come wand patient

## 2023-11-20 NOTE — ED Notes (Addendum)
 Call to Inspire help line at [phone] spoke with [name] who instructed me to the operations manual page, [email or web address] . Where the instructions for pt's model 2500 inspire device were printed. [name] assure me that the device implant was designed to open the airway and does not have the ability to constrict the airway. Currently the device remains at the pt's bedside for her use. The downloaded manual was placed in [name] [name] chart.  [name]. Lazenby is currently refusing to wear her nasal cannula oxygen  after being informed that her room air saturation was 87%. She complaint that the cannula she arrived with was uncomfortable so a new softer one was provide, but she still refuse to wear it. She is very angry and accusing this Clinical research associate of mistreating her by refusing to feed her. Sprite and [place] sandwich was given of which she ate 100%.   [name] Marengo continues to have a flat angry affect with a very bitter attitude as evidence by her occasional use of profanity.

## 2023-11-20 NOTE — Progress Notes (Signed)
   11/20/23 2219  BiPAP/CPAP/SIPAP  BiPAP/CPAP/SIPAP Pt Type Adult (Pt is using her implanted inspire from home)

## 2023-11-20 NOTE — ED Notes (Signed)
 Respiratory tech and ED provider called about use of Inspire inplanted C-PAP device and also safety concerns whether pt could harm self with the device. Attempting to contact Inspire rep. At this time.

## 2023-11-20 NOTE — BH Assessment (Signed)
 Clinician spoke with IRIS to complete pt's TTS assessment. Clinician provided pt's name, MRN, location, age, room number and provider's name Secure message completed.    Iris coordinator to update secure chat when assessment time and provider are assigned.  Rosi Converse, MS, [facility], [facility] Triage Specialist [phone]

## 2023-11-20 NOTE — ED Provider Notes (Signed)
 [place] EMERGENCY DEPARTMENT AT [facility] Provider Note   CSN: [id number] Arrival date & time: 11/20/23  1400     History {Add pertinent medical, surgical, social history, OB history to HPI:1} Chief Complaint  Patient presents with   Altered Mental Status   Urinary Frequency    Tabytha Gradillas is a 68 y.o. female with PMH as listed below who presents with AMS.  Daughter found pt laying in the kitchen floor today. Pt was last seen yesterday around 1800-1900 and was her normal self. Pt arrives to the ED very lethargic, but she is oriented x 4. Pt reports increased urinary frequency. She is unsure when she took a dose of her oxycodone . EMS administered 1000 mL of LR pta. When patient falls asleep during triage, her spo2 will drop in the low 80s. When this occurs, pt will respond to verbal stimuli and oxygen  will improve. She is currently on 3Lnc. Patient wears oxygen  at night or when needed. Daughter is at bedside, she reports she could not find her pain medication bottle at the patient's house.  Patient states that she and her husband split up after 30 years of marriage and she was busy trying at the house "put back together."  She states she also fell in the shower yesterday and lost her balance and was hurting after that fall but cannot specify where.  She states she did not hit her head or lose consciousness in the fall.  Patient is very sleepy with slurred speech on my assessment and requires attention to stay awake.  Initially patient denies any intentional overdose but daughter states that later when care team leaves the room patient endorses unintentional overdose stating "I do not want to be here anymore."   Past Medical History:  Diagnosis Date   Allergic rhinitis 01/23/2008   Angina pectoris 06/06/2016   Arthritis    Attention deficit hyperactivity disorder (ADHD) 02/07/2016   Back pain, lumbar 01/21/2009   Bell's palsy 02/06/2008   Bipolar disorder 12/13/2009    Chronic idiopathic constipation    Dizziness 04/16/2008   Edema, leg 01/21/2009   Epigastric pain    Essential hypertension 01/23/2008   Gastro-esophageal reflux disease without esophagitis 01/23/2008   Generalized anxiety disorder 01/23/2008   Headache 04/15/2007   Hidradenitis 10/04/2008   History of colonic polyps    Hyperlipidemia    Hypokalemia 07/23/2018   Insomnia 01/23/2008   Major depressive disorder 01/23/2008   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   Neck pain 05/06/2017   Obesity (BMI 30-39.9) 11/10/2015   OSA (obstructive sleep apnea) 04/15/2007   inspire implant; does not always remember to turn device on   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/2008   Qualifier: Diagnosis of  By: Alyne Babinski MD, Lenis Quin    PUD (peptic ulcer disease) 06/06/2016   Unspecified urinary incontinence 01/23/2008   Wears dentures    Wears glasses        Home Medications Prior to Admission medications   Medication Sig Start Date End Date Taking? Authorizing Provider  acyclovir  (ZOVIRAX ) 400 MG tablet TAKE 1 TABLET BY MOUTH 4 TIMES A DAY AS NEEDED 06/17/23   Donley Furth, MD  albuterol  (PROVENTIL ) (2.5 MG/3ML) 0.083% nebulizer solution Take 3 mLs (2.5 mg total) by nebulization every 6 (six) hours as needed for wheezing or shortness of breath. 08/14/23   Lind Repine, MD  albuterol  (VENTOLIN  HFA) 108 (90 Base) MCG/ACT inhaler Inhale 1-2 puffs into the lungs every 6 (six) hours as needed. 08/14/23  Lind Repine, MD  alprazolam  (XANAX ) 2 MG tablet Take 1 tablet (2 mg total) by mouth 3 (three) times daily as needed for anxiety. 08/15/23   Donley Furth, MD  Ascorbic Acid (VITAMIN C PO) Take by mouth.    [provider]  aspirin  EC 81 MG tablet Take 81 mg by mouth daily. Swallow whole.    [provider]  Aspirin -Acetaminophen -Caffeine (GOODYS EXTRA STRENGTH PO) Take by mouth.    [provider]  B Complex-C-Folic Acid (B-COMPLEX/VITAMIN C PO) Take 1 tablet by mouth daily.    [provider]  Cholecalciferol  (VITAMIN D3) 125 MCG (5000 UT) CAPS Take 5,000 Units by mouth once a week.    [provider]  ezetimibe  (ZETIA ) 10 MG tablet Take 1 tablet (10 mg total) by mouth daily. 10/31/23 01/29/24  Jacqueline Matsu, MD  Fluticasone -Umeclidin-Vilant (TRELEGY ELLIPTA ) 100-62.5-25 MCG/ACT AEPB Inhale 1 puff into the lungs daily. 08/14/23   Lind Repine, MD  hydrocortisone  (ANUSOL -HC) 2.5 % rectal cream PLACE 1 APPLICATION RECTALLY 2 TIMES DAILY. 09/09/23   Donley Furth, MD  Magnesium  500 MG CAPS Take 500 mg by mouth daily.    [provider]  MAGNESIUM  PO Take by mouth.    [provider]  Multiple Vitamins-Minerals (MULTIVITAMIN WITH MINERALS) tablet Take 1 tablet by mouth daily.    [provider]  omeprazole  (PRILOSEC) 20 MG capsule TAKE 1 CAPSULE BY MOUTH 2 TIMES DAILY BEFORE A MEAL. 01/21/23   Donley Furth, MD  Oxycodone  HCl 10 MG TABS Take 1 tablet (10 mg total) by mouth every 6 (six) hours as needed (pain). 11/19/23   Donley Furth, MD  Oxycodone  HCl 10 MG TABS Take 1 tablet (10 mg total) by mouth every 6 (six) hours as needed (pain). 11/19/23   Donley Furth, MD  Oxycodone  HCl 10 MG TABS Take 1 tablet (10 mg total) by mouth every 6 (six) hours as needed (pain). 11/19/23   Donley Furth, MD  polyethylene glycol (MIRALAX  / GLYCOLAX ) 17 g packet Use one packet daily Patient taking differently: Take 17 g by mouth 2 (two) times daily as needed for moderate constipation. 10/10/21   Donley Furth, MD  potassium chloride  (KLOR-CON ) 10 MEQ tablet TAKE 1 TABLET (10 MEQ TOTAL) BY MOUTH DAILY. 01/21/23   Donley Furth, MD  rosuvastatin  (CRESTOR ) 40 MG tablet TAKE 1 TABLET BY MOUTH DAILY. 06/17/23   Donley Furth, MD  triamterene -hydrochlorothiazide (DYAZIDE) 37.5-25 MG capsule Take 1 capsule by mouth every morning. 07/02/22   [provider]      Allergies    Sulfonamide derivatives, Flexeril  [cyclobenzaprine ], Hydromorphone , Morphine  and codeine, and  Sulfamethoxazole    Review of Systems   Review of Systems A 10 point review of systems was performed and is negative unless otherwise reported in HPI.  Physical Exam Updated Vital Signs BP 132/79   Pulse 76   Temp 98.3 F (36.8 C)   Resp 18   SpO2 100%  Physical Exam General: Normal appearing {Desc; female/female:11659}, lying in bed.  HEENT: PERRLA, Sclera anicteric, MMM, trachea midline.  Cardiology: RRR, no murmurs/rubs/gallops. BL radial and DP pulses equal bilaterally.  Resp: Normal respiratory rate and effort. CTAB, no wheezes, rhonchi, crackles.  Abd: Soft, non-tender, non-distended. No rebound tenderness or guarding.  GU: Deferred. MSK: No peripheral edema or signs of trauma. Extremities without deformity or TTP. No cyanosis or clubbing. Skin: warm, dry. No rashes or lesions. Back: No  CVA tenderness Neuro: A&Ox4, CNs II-XII grossly intact. MAEs. Sensation grossly intact.  Psych: Normal mood and affect.   ED Results / Procedures / Treatments   Labs (all labs ordered are listed, but only abnormal results are displayed) Labs Reviewed  CBC WITH DIFFERENTIAL/PLATELET - Abnormal; Notable for the following components:      Result Value   RBC 3.86 (*)    Hemoglobin 11.1 (*)    MCHC 29.8 (*)    All other components within normal limits  COMPREHENSIVE METABOLIC PANEL WITH GFR - Abnormal; Notable for the following components:   Calcium  8.5 (*)    All other components within normal limits  URINALYSIS, W/ REFLEX TO CULTURE (INFECTION SUSPECTED) - Abnormal; Notable for the following components:   Nitrite POSITIVE (*)    Bacteria, UA MANY (*)    All other components within normal limits  RAPID URINE DRUG SCREEN, HOSP PERFORMED - Abnormal; Notable for the following components:   Opiates POSITIVE (*)    Benzodiazepines POSITIVE (*)    All other components within normal limits  BLOOD GAS, VENOUS - Abnormal; Notable for the following components:   pCO2, Ven 62 (*)    pO2, Ven <31  (*)    Bicarbonate 32.7 (*)    Acid-Base Excess 5.3 (*)    All other components within normal limits  SARS CORONAVIRUS 2 BY RT PCR  ETHANOL  CK  CBG MONITORING, ED    EKG EKG Interpretation Date/Time:  Wednesday November 20 2023 14:15:13 EDT Ventricular Rate:  82 PR Interval:  159 QRS Duration:  93 QT Interval:  413 QTC Calculation: 483 R Axis:   88  Text Interpretation: Sinus rhythm Borderline right axis deviation Confirmed by Annita Kindle [id number]) on 11/20/2023 2:18:41 PM  Radiology CXR: Prominence of the interstitial markings. Could correlate with chronic interstitial lung disease and superimposed congestive changes.   CTH, CT C-spine: 1. No acute intracranial pathology. 2. No fracture or static subluxation of the cervical spine. 3. Status post anterior cervical discectomy and fusion of C5-C6. Otherwise mild disc space height loss and osteophytosis of the remaining cervical levels.    Procedures Procedures  {Document cardiac monitor, telemetry assessment procedure when appropriate:1}  Medications Ordered in ED Medications  ondansetron  (ZOFRAN ) injection 4 mg (4 mg Intravenous Given 11/20/23 1442)  naloxone  (NARCAN ) injection 0.4 mg (0.4 mg Intravenous Given 11/20/23 1430)    ED Course/ Medical Decision Making/ A&P                          Medical Decision Making Amount and/or Complexity of Data Reviewed Labs: ordered. Decision-making details documented in ED Course. Radiology: ordered. Decision-making details documented in ED Course.  Risk Prescription drug management.    This patient presents to the ED for concern of ***, this involves an extensive number of treatment options, and is a complaint that carries with it a high risk of complications and morbidity.  I considered the following differential and admission for this acute, potentially life threatening condition.   MDM:    Patient experienced a fall in the shower yesterday and   Patient was treated  with 0.4 mg of IV Narcan  and does wake up some with improved respirations.  Do not want a give her additional Narcan , concerned that she may go into acute withdrawal.  She is breathing well on her own and is not bradypnea anymore, we will continue to monitor her.  Clinical Course as of 11/25/23 2135  Wed Nov 20, 2023  1515 pCO2, Ven(!): 62 Very mild hypercarbia with some degree of chronic compensation [HN]  1515 Alcohol, Ethyl (B): <15 neg [HN]  1516 Glucose-Capillary: 86 wnl [HN]  1516 DG Chest Portable 1 View Prominence of the interstitial markings. Could correlate with chronic interstitial lung disease and superimposed congestive changes.   [HN]  1516 Comprehensive metabolic panel(!) Unremarkable in the context of this patient's presentation  [HN]  1553 CT Head Wo Contrast 1. No acute intracranial pathology. 2. No fracture or static subluxation of the cervical spine. 3. Status post anterior cervical discectomy and fusion of C5-C6. Otherwise mild disc space height loss and osteophytosis of the remaining cervical levels.   [HN]    Clinical Course User Index [HN] Merdis Stalling, MD    Labs: I Ordered, and personally interpreted labs.  The pertinent results include:  ***  Imaging Studies ordered: I ordered imaging studies including *** I independently visualized and interpreted imaging. I agree with the radiologist interpretation  Additional history obtained from ***.  External records from outside source obtained and reviewed including ***  Cardiac Monitoring: The patient was maintained on a cardiac monitor.  I personally viewed and interpreted the cardiac monitored which showed an underlying rhythm of: ***  Reevaluation: After the interventions noted above, I reevaluated the patient and found that they have :{resolved/improved/worsened:23923::"improved"}  Social Determinants of Health: ***  Disposition:  ***  Co morbidities that complicate the patient  evaluation  Past Medical History:  Diagnosis Date   Allergic rhinitis 01/23/2008   Angina pectoris 06/06/2016   Arthritis    Attention deficit hyperactivity disorder (ADHD) 02/07/2016   Back pain, lumbar 01/21/2009   Bell's palsy 02/06/2008   Bipolar disorder 12/13/2009   Chronic idiopathic constipation    Dizziness 04/16/2008   Edema, leg 01/21/2009   Epigastric pain    Essential hypertension 01/23/2008   Gastro-esophageal reflux disease without esophagitis 01/23/2008   Generalized anxiety disorder 01/23/2008   Headache 04/15/2007   Hidradenitis 10/04/2008   History of colonic polyps    Hyperlipidemia    Hypokalemia 07/23/2018   Insomnia 01/23/2008   Major depressive disorder 01/23/2008   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   Neck pain 05/06/2017   Obesity (BMI 30-39.9) 11/10/2015   OSA (obstructive sleep apnea) 04/15/2007   inspire implant; does not always remember to turn device on   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/2008   Qualifier: Diagnosis of  By: Alyne Babinski MD, Lenis Quin    PUD (peptic ulcer disease) 06/06/2016   Unspecified urinary incontinence 01/23/2008   Wears dentures    Wears glasses      Medicines No orders of the defined types were placed in this encounter.   I have reviewed the patients home medicines and have made adjustments as needed  Problem List / ED Course: Problem List Items Addressed This Visit   None        {Document critical care time when appropriate:1} {Document review of labs and clinical decision tools ie heart score, Chads2Vasc2 etc:1}  {Document your independent review of radiology images, and any outside records:1} {Document your discussion with family members, caretakers, and with consultants:1} {Document social determinants of health affecting pt's care:1} {Document your decision making why or why not admission, treatments were needed:1}  This note was created using dictation software, which may contain spelling or grammatical errors.

## 2023-11-20 NOTE — Telephone Encounter (Signed)
 FYI Only or Action Required?: FYI only for provider  Patient was last seen in primary care on 11/19/2023 by Donley Furth, MD. Called Nurse Triage reporting Altered Mental Status. Symptoms began today. Interventions attempted: Nothing. Symptoms are: unchanged.  Triage Disposition: Call EMS 911 Now  Patient/caregiver understands and will follow disposition?:  Copied from CRM [id number]. Topic: Clinical - Red Word Triage >> Nov 20, 2023 12:27 PM [name] [name] wrote: [facility] that prompted transfer to Nurse Triage: Altered mental status. Daughter, [name] [name], called in stating that her mom is all out of sorts, mom stating that she woke up on floor and daughter stating her eyes do not look right, cannot focus. Reason for Disposition  [1] Difficult to awaken or acting confused ([name].g., disoriented, slurred speech) AND [2] present now AND [3] new-onset  Answer Assessment - Initial Assessment Questions 1. LEVEL OF CONSCIOUSNESS: "How is he (she, the patient) acting right now?" ([name].g., alert-oriented, confused, lethargic, stuporous, comatose)     Altered, daughter states she called the mother earlier and she seemed off, pt lives alone. Daughter came to pts house and found pt on the floor, pinpoint eyes. Has rx for narcotic pain med. Daughter states that pt is up and moving around but when asked to get dressed pt put snow pants on. Per daughter pt is acting confused at this time, advised that daughter call 911.    Information obtained from daughter/DPR [name] [name]  Protocols used: Confusion - Delirium-A-AH

## 2023-11-21 DIAGNOSIS — F314 Bipolar disorder, current episode depressed, severe, without psychotic features: Secondary | ICD-10-CM

## 2023-11-21 DIAGNOSIS — R45851 Suicidal ideations: Secondary | ICD-10-CM | POA: Diagnosis not present

## 2023-11-21 LAB — SARS CORONAVIRUS 2 BY RT PCR: SARS Coronavirus 2 by RT PCR: NEGATIVE

## 2023-11-21 MED ORDER — ALPRAZOLAM 0.5 MG PO TABS: 1.0000 mg | ORAL_TABLET | Freq: Two times a day (BID) | ORAL | Status: DC | PRN

## 2023-11-21 NOTE — Progress Notes (Addendum)
 Per [facility], patient was referred to the following facilities:  Service Provider Phone  [facility] [phone]  [facility] [phone]  [facility] [phone]  [facility] [phone]  [facility] [phone]  [facility] [phone]  [facility] [phone]  [facility] [phone]  [facility] [phone], [place] [phone]

## 2023-11-21 NOTE — Consult Note (Signed)
 [facility] Psychiatric Consult Initial  Patient Name: .Dillan Lunden  MRN: [id number]  DOB: [birth year]  Consult Order details:  Orders (From admission, onward)     Start     Ordered   11/20/23 1714  CONSULT TO CALL ACT TEAM       Ordering Provider: Merdis Stalling, MD  Provider:  (Not yet assigned)  Question:  Reason for Consult?  Answer:  Psych consult   11/20/23 1713             Mode of Visit: In person    Psychiatry Consult Evaluation  Service Date: November 21, 2023 LOS:  LOS: 0 days  Chief Complaint "lonely"  Primary Psychiatric Diagnoses  Bipolar disorder 2.   Generalized anxiety disorder 3.   Major depressive disorder  Assessment  Shivangi Santana Gosdin is a 68 y.o. female admitted: Presented to the ED on 11/20/2023  2:01 PM for overdose on medication, due to suicide attempt. She carries the psychiatric diagnoses of bipolar, anxiety, and depression and has a past medical history of interstitial lung disease, OSA, GERD.   Her current presentation of loneliness, sadness, anxiety, suicidal thoughts is most consistent with decompensating depression. She meets criteria for inpatient psychiatric admission based on overdose attempt.  Current outpatient psychotropic medications include Prozac , Xanax  and historically she has had a beneficial response to these medications. She was non compliant with Prozac  but compliant with Xanax  prior to admission as evidenced by patient report and daughter. On initial examination, patient pleasant and cooperative. Please see plan below for detailed recommendations.   Diagnoses:  Active Hospital problems: Principal Problem:   Bipolar disorder Active Problems:   GAD (generalized anxiety disorder)   Major depressive disorder    Plan   ## Psychiatric Medication Recommendations:  Recommend increasing patient's Xanax  to 1 mg twice daily as needed for anxiety to help with tapering Trazodone 50 mg as needed for sleep  ## Medical Decision  Making Capacity: Not specifically addressed in this encounter  ## Further Work-up:  -- No further workup needed at this time EKG or UDS -- most recent EKG on 11/20/23 had QtC of 483 -- Pertinent labwork reviewed earlier this admission includes: CBC, CMP, UDS, EKG, urinalysis   ## Disposition:-- We recommend inpatient psychiatric hospitalization. Patient has been involuntarily committed on 11/20/23.  We will recommend inpatient for psychiatric stabilization and medication management.   ## Behavioral / Environmental: -To minimize splitting of staff, assign one staff person to communicate all information from the team when feasible. or Utilize compassion and acknowledge the patient's experiences while setting clear and realistic expectations for care.    ## Safety and Observation Level:  - Based on my clinical evaluation, I estimate the patient to be at low risk of self harm in the current setting. - At this time, we recommend  routine. This decision is based on my review of the chart including patient's history and current presentation, interview of the patient, mental status examination, and consideration of suicide risk including evaluating suicidal ideation, plan, intent, suicidal or self-harm behaviors, risk factors, and protective factors. This judgment is based on our ability to directly address suicide risk, implement suicide prevention strategies, and develop a safety plan while the patient is in the clinical setting. Please contact our team if there is a concern that risk level has changed.  CSSR Risk Category:C-SSRS RISK CATEGORY: No Risk  Suicide Risk Assessment: Patient has following modifiable risk factors for suicide: active suicidal ideation, untreated depression, and social  isolation, which we are addressing by recommending inpatient psychiatric admission. Patient has following non-modifiable or demographic risk factors for suicide: separation or divorce and psychiatric  hospitalization Patient has the following protective factors against suicide: Access to outpatient mental health care and Supportive family  Thank you for this consult request. Recommendations have been communicated to the primary team.  We will recommend inpatient psychiatric admission and continue to follow patient at this time.   Chandra Come, PMHNP       History of Present Illness  Relevant Aspects of Hospital ED Course:  Admitted on 11/20/2023 for untreated depression and suicide attempt.  Patient Report:  Shondra Capps, 68 y.o., female patient seen face to face by this provider, consulted with Dr. Deborah Falling; and chart reviewed on 11/21/23.  On evaluation Aleane Wesenberg reports that she called one of her daughters, and asked her "how many pills would not take to overdose."She states she called this particular daughter because this daughter has had a past history of "being a wild child "using different drugs, and partying when she was younger, so she states that she felt this child with, any feels she would be to overdose.  This provider discussed with her, how that can be taken as wanting to kill yourself, and it is a cause of concern, especially for someone who loves you, patient nonchalantly states "she took it to the extreme, I did not mean it like that."  Patient states that she was not trying to end her life, states that she has thought about suicide due to her loneliness.  When this provider asked her if she took too many oxycodone 's, she evasively states "what is a lot to you may not be a lot to me."  She states that her doctor prescribed her 2 mg Xanax  for 4 times a day, she says sometimes she takes more due to her anxiety and pain, she has also been prescribed oxycodone  every 6 hours as needed for pain, she also states that she takes more, states she is not sure how much more she takes.  Patient does not appear to be disorganized when asking how she takes her medicine, and who  prescribes her medications.  She also states that she does have some stressful events happening in her life, stating she has a history of bipolar disorder and has recently been dealing with a temporary separation from her husband.  She states her "favorite daughter, Buddie Carina "just moved to [place] , which she states is breaking her heart.  She states he has another daughter here in [place]  [name], who she calls "the perfectionist"says she and her do not get along well, she just left everything of hers to [name], feels that [name] just wants her money.  She also states that she has a sister who is 3 years younger than she is, who she has not spoken to in about 4 years due to her disagreement, states she is also feeling sad about it, and missing her sister. Patient states that she has been admitted to an inpatient psychiatric facility, years ago, states when discussing what psychiatric diagnosis she has, she states "if there is a label out there, I got it."Says that she was prescribed Prozac  for depression, but weaned herself off of it, would not elaborate why, felt she did not need it.  Patient states he has a medical history of obstructive sleep apnea, interstitial lung disease, and degenerative rheumatoid arthritis, she states he also has a liver disease in which she  is about to die and about 3 to 4 years.  Denies using any drugs or alcohol states her appetite and sleep are good.  Endorses that she used to have an outpatient therapist and psychiatric provider at [facility], but says that her daughter [name] wanted her to go to a different therapist, says now she does not go to [facility] anymore.  She says she is trying to keep the peace with her daughter [name] so she allows her to make medical decisions for her to keep the peace.  Patient denies access to weapons.  Patient is retired.  Patient denies alcohol and substance use.   Patient assessed by nurse practitioner. Patient pleasant and cooperative during assessment. Patient  is sitting up in bed, with a slight lean to the right side, appears to be in moderate distress as she says her rheumatoid arthritis is bothering her in her hands. She is alert, oriented x 3, patient appears drowsy, also presenting with slurred speech, states she sleeps well but does not know why she is so drowsy, stating "probably because I took too much oxycodone ." She is cooperative and attentive. Her mood is sad with congruent affect.   Objectively there is no evidence of psychosis/mania or delusional thinking.  Patient does appear to be confused at times, and evasive when speaking about why she is here and her attempted overdose. She currently denies suicidal/self-harm/homicidal ideation, psychosis, and paranoia.  We will recommend inpatient for psychiatric stabilization and medication management.   Psych ROS:  Depression: Endorses Anxiety: Endorses Mania (lifetime and current): Denies Psychosis: (lifetime and current): Denies   Collateral information:  Contacted patient's daughter [name] with patient's permission, she stated that patient and her husband have separated, states she did not realize that patient was as sad and lonely as she was.  She states that she did try and visit her mother every other day, to help "regulate her medications "she states she did start seeing that her mother appeared more disoriented and confused, possibly due to taking more medication than prescribed.  She states yesterday she went to her house, and patient tells was cluttered and unclean, she states that is not like her mother at all.  She states that she has also seen empty wine coolers at her mother's house, does not know how many her mother drinks if she drinks at all.  She states her mother want to go see her neurologist about 3 years ago, due to her confusion, states there was no Alzheimer's or dementia or any neurologic impairment.  States her mother has been to behavioral health Hospital when it was called  charter, many years ago due to depression and suicidal thoughts.  She confirmed that patient then stop Prozac  recently due to weight gain and she did not feel like the medication was helping her.  She states that patient has a psychiatric diagnosis of depression, bipolar, anxiety.  States that her mother does not have any liver disease, but does have interstitial lung disease, OSA, GERD. She is in agreement that her mother needs to be admitted to an inpatient facility.    Review of Systems  Psychiatric/Behavioral:  Positive for depression, substance abuse and suicidal ideas.      Psychiatric and Social History  Psychiatric History:  Information collected from patient and daughter  Prev Dx/Sx: Depression, bipolar, anxiety Current Psych Provider: Denies, patient used to go to [facility] Meds (current): Xanax  Previous Med Trials: Yes Prozac  Therapy: Audrene Lease, therapist  Prior Psych Hospitalization: Yes Prior  Self Harm: Denies Prior Violence: Denies   Family Psych History: Yes Family Hx suicide: Denies  Social History:  Developmental Hx: Deferred Educational Hx: Graduated high school Occupational Hx: Retired Armed forces operational officer Hx: Denies Living Situation: Lives alone Spiritual Hx: Yes Access to weapons/lethal means: Denies   Substance History Alcohol: Yes Type of alcohol wine coolers Last Drink unknown Number of drinks per day denies History of alcohol withdrawal seizures Denies History of DT's Denies Tobacco: Denies Illicit drugs: Denies Prescription drug abuse: Yes Rehab hx: Denies  Exam Findings  Physical Exam:  Vital Signs:  Temp:  [98 F (36.7 C)-99.1 F (37.3 C)] 98.1 F (36.7 C) (06/05 0722) Pulse Rate:  [77-94] 94 (06/05 0722) Resp:  [7-20] 16 (06/05 0722) BP: (106-134)/(63-83) 134/63 (06/05 0722) SpO2:  [86 %-100 %] 92 % (06/05 0722) Blood pressure 134/63, pulse 94, temperature 98.1 F (36.7 C), temperature source Oral, resp. rate 16, SpO2 92%. There is no  height or weight on file to calculate BMI.  Physical Exam Vitals and nursing note reviewed. Exam conducted with a chaperone present.  Neurological:     Mental Status: She is alert.  Psychiatric:        Attention and Perception: Attention normal.        Mood and Affect: Mood is depressed. Affect is flat.        Speech: Speech is slurred.        Behavior: Behavior is cooperative.        Thought Content: Thought content includes suicidal ideation.        Cognition and Memory: Cognition is impaired.        Judgment: Judgment is inappropriate.     Mental Status Exam: General Appearance: Casual  Orientation:  Full (Time, Place, and Person)  Memory:  Immediate;   Fair Remote;   Fair  Concentration:  Concentration: Fair and Attention Span: Fair  Recall:  Fair  Attention  Fair  Eye Contact:  Fair  Speech:  Slurred  Language:  Fair  Volume:  Normal  Mood: sad  Affect:  Appropriate and Tearful  Thought Process:  Disorganized  Thought Content:  Illogical  Suicidal Thoughts:  Yes.  without intent/plan  Homicidal Thoughts:  No  Judgement:  Impaired  Insight:  Lacking  Psychomotor Activity:  Normal  Akathisia:  NA  Fund of Knowledge:  Fair      Assets:  Manufacturing systems engineer Desire for Improvement Financial Resources/Insurance Housing Social Support  Cognition:  Impaired,  Mild  ADL's:  Intact  AIMS (if indicated):        Other History   These have been pulled in through the EMR, reviewed, and updated if appropriate.  Family History:  The patient's family history includes Alcohol abuse in an other family member; Arthritis in an other family member; Asthma in her father; Coronary artery disease in her brother, sister, and another family member; Dementia in her mother; Depression in an other family member; Diabetes in an other family member; Hyperlipidemia in her brother, mother, sister, sister, and another family member; Hypertension in her brother, sister, sister, and another  family member; Sudden death in an other family member; Thyroid  disease in her sister.  Medical History: Past Medical History:  Diagnosis Date  . Allergic rhinitis 01/23/2008  . Angina pectoris 06/06/2016  . Arthritis   . Attention deficit hyperactivity disorder (ADHD) 02/07/2016  . Back pain, lumbar 01/21/2009  . Bell's palsy 02/06/2008  . Bipolar disorder 12/13/2009  . Chronic idiopathic constipation   .  Dizziness 04/16/2008  . Edema, leg 01/21/2009  . Epigastric pain   . Essential hypertension 01/23/2008  . Gastro-esophageal reflux disease without esophagitis 01/23/2008  . Generalized anxiety disorder 01/23/2008  . Headache 04/15/2007  . Hidradenitis 10/04/2008  . History of colonic polyps   . Hyperlipidemia   . Hypokalemia 07/23/2018  . Insomnia 01/23/2008  . Major depressive disorder 01/23/2008  . 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  . Neck pain 05/06/2017  . Obesity (BMI 30-39.9) 11/10/2015  . OSA (obstructive sleep apnea) 04/15/2007   inspire implant; does not always remember to turn device on  . 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 PUD (peptic ulcer disease) 06/06/2016  . Unspecified urinary incontinence 01/23/2008  . Wears dentures   . Wears glasses     Surgical History: Past Surgical History:  Procedure Laterality Date  . ABDOMINAL HYSTERECTOMY    . ANTERIOR FUSION CERVICAL SPINE  2006   Dr. Lamon Pillow  . BREAST BIOPSY Right 1981   benign  . BREAST EXCISIONAL BIOPSY Right 1981   benign   scar does not show  . BREAST SURGERY Bilateral 12/11/2012   reductions per Dr. Willadean Hark in [facility]   . CATARACT EXTRACTION, BILATERAL Bilateral last summer of 2018   at [facility]  . COLONOSCOPY  03/07/2021   per Dr. Tova Fresh, adenomatous polyps, repeat in 7 yrs  . DRUG INDUCED ENDOSCOPY N/A 01/30/2019   Procedure: DRUG INDUCED ENDOSCOPY;  Surgeon: Virgina Grills, MD;  Location: [facility];  Service: ENT;  Laterality:  N/A;  . ESOPHAGOGASTRODUODENOSCOPY  04/23/2011   per Dr. Tova Fresh, normal   . EYE SURGERY  06/18/2016   L eyelid   . IMPLANTATION OF HYPOGLOSSAL NERVE STIMULATOR Right 03/25/2019   Procedure: IMPLANTATION OF HYPOGLOSSAL NERVE STIMULATOR;  Surgeon: Virgina Grills, MD;  Location: [facility];  Service: ENT;  Laterality: Right;  . LEFT HEART CATH AND CORONARY ANGIOGRAPHY N/A 07/05/2022   Procedure: LEFT HEART CATH AND CORONARY ANGIOGRAPHY;  Surgeon: Swaziland, Peter M, MD;  Location: [facility] INVASIVE CV LAB;  Service: Cardiovascular;  Laterality: N/A;  . LUMBAR DISC SURGERY  2003   Dr. Lamon Pillow  . MULTIPLE TOOTH EXTRACTIONS    . ORIF WRIST FRACTURE Right 08/29/2018  . REDUCTION MAMMAPLASTY Bilateral 2013  . SPLIT NIGHT STUDY  11/11/2015  . UVULOPALATOPHARYNGOPLASTY    . VAGINAL HYSTERECTOMY  05/1995   secondary to fibroids.     Medications:   Current Facility-Administered Medications:  .  acetaminophen  (TYLENOL ) tablet 650 mg, 650 mg, Oral, Q4H PRN, Merdis Stalling, MD .  albuterol  (VENTOLIN  HFA) 108 (90 Base) MCG/ACT inhaler 1-2 puff, 1-2 puff, Inhalation, Q6H PRN, Burnette Carte, MD .  aspirin  EC tablet 81 mg, 81 mg, Oral, Daily, Burnette Carte, MD, 81 mg at 11/21/23 1107 .  budesonide-glycopyrrolate-formoterol (BREZTRI) 160-9-4.8 MCG/ACT inhaler 2 puff, 2 puff, Inhalation, BID, Messick, Herma Longest, MD .  cholecalciferol (VITAMIN D3) 25 MCG (1000 UNIT) tablet 5,000 Units, 5,000 Units, Oral, Weekly, Burnette Carte, MD, 5,000 Units at 11/21/23 1105 .  ezetimibe  (ZETIA ) tablet 10 mg, 10 mg, Oral, Daily, Burnette Carte, MD, 10 mg at 11/21/23 1108 .  magnesium oxide (MAG-OX) tablet 400 mg, 400 mg, Oral, Daily, Burnette Carte, MD, 400 mg at 11/21/23 1108 .  ondansetron  (ZOFRAN ) tablet 4 mg, 4 mg, Oral, Q8H PRN, Merdis Stalling, MD .  pantoprazole  (PROTONIX ) EC tablet 40 mg, 40 mg, Oral, Daily, Messick, Herma Longest,  MD, 40 mg at 11/21/23 1108 .  polyethylene glycol (MIRALAX  / GLYCOLAX ) packet 17 g, 17 g, Oral,  BID PRN, Burnette Carte, MD .  rosuvastatin  (CRESTOR ) tablet 40 mg, 40 mg, Oral, Daily, Messick, Herma Longest, MD, 40 mg at 11/21/23 1107  Current Outpatient Medications:  .  acyclovir  (ZOVIRAX ) 400 MG tablet, TAKE 1 TABLET BY MOUTH 4 TIMES A DAY AS NEEDED (Patient taking differently: Take 400 mg by mouth 2 (two) times daily.), Disp: 120 tablet, Rfl: 5 .  albuterol  (PROVENTIL ) (2.5 MG/3ML) 0.083% nebulizer solution, Take 3 mLs (2.5 mg total) by nebulization every 6 (six) hours as needed for wheezing or shortness of breath., Disp: 75 mL, Rfl: 12 .  albuterol  (VENTOLIN  HFA) 108 (90 Base) MCG/ACT inhaler, Inhale 1-2 puffs into the lungs every 6 (six) hours as needed., Disp: 8 g, Rfl: 2 .  alprazolam  (XANAX ) 2 MG tablet, Take 1 tablet (2 mg total) by mouth 3 (three) times daily as needed for anxiety., Disp: 90 tablet, Rfl: 5 .  Ascorbic Acid (VITAMIN C PO), Take 500 tablets by mouth daily., Disp: , Rfl:  .  aspirin  EC 81 MG tablet, Take 81 mg by mouth daily. Swallow whole., Disp: , Rfl:  .  Aspirin -Acetaminophen -Caffeine (GOODYS EXTRA STRENGTH PO), Take 1 packet by mouth daily as needed., Disp: , Rfl:  .  B Complex-C-Folic Acid (B-COMPLEX/VITAMIN C PO), Take 1 tablet by mouth daily., Disp: , Rfl:  .  Cholecalciferol (VITAMIN D3) 125 MCG (5000 UT) CAPS, Take 5,000 Units by mouth once a week., Disp: , Rfl:  .  ezetimibe  (ZETIA ) 10 MG tablet, Take 1 tablet (10 mg total) by mouth daily., Disp: 90 tablet, Rfl: 3 .  Fluticasone -Umeclidin-Vilant (TRELEGY ELLIPTA ) 100-62.5-25 MCG/ACT AEPB, Inhale 1 puff into the lungs daily. (Patient taking differently: Inhale 1 puff into the lungs daily as needed.), Disp: 60 each, Rfl: 5 .  Magnesium 500 MG CAPS, Take 500 mg by mouth daily., Disp: , Rfl:  .  Multiple Vitamins-Minerals (MULTIVITAMIN WITH MINERALS) tablet, Take 1 tablet by mouth every evening., Disp: , Rfl:  .  omeprazole  (PRILOSEC) 20 MG capsule, TAKE 1 CAPSULE BY MOUTH 2 TIMES DAILY BEFORE A MEAL., Disp: 180  capsule, Rfl: 3 .  Oxycodone  HCl 10 MG TABS, Take 1 tablet (10 mg total) by mouth every 6 (six) hours as needed (pain)., Disp: 120 tablet, Rfl: 0 .  polyethylene glycol (MIRALAX  / GLYCOLAX ) 17 g packet, Use one packet daily (Patient taking differently: Take 17 g by mouth 2 (two) times daily as needed for moderate constipation.), Disp: 90 each, Rfl: 3 .  potassium chloride  (KLOR-CON ) 10 MEQ tablet, TAKE 1 TABLET (10 MEQ TOTAL) BY MOUTH DAILY., Disp: 90 tablet, Rfl: 3 .  rosuvastatin  (CRESTOR ) 40 MG tablet, TAKE 1 TABLET BY MOUTH DAILY., Disp: 90 tablet, Rfl: 3 .  hydrocortisone  (ANUSOL -HC) 2.5 % rectal cream, PLACE 1 APPLICATION RECTALLY 2 TIMES DAILY. (Patient not taking: Reported on 11/21/2023), Disp: 30 g, Rfl: 1 .  Oxycodone  HCl 10 MG TABS, Take 1 tablet (10 mg total) by mouth every 6 (six) hours as needed (pain)., Disp: 120 tablet, Rfl: 0 .  Oxycodone  HCl 10 MG TABS, Take 1 tablet (10 mg total) by mouth every 6 (six) hours as needed (pain)., Disp: 120 tablet, Rfl: 0  Allergies: Allergies  Allergen Reactions  . Sulfonamide Derivatives Swelling    Facial swelling  . Flexeril  [Cyclobenzaprine ] Other (See Comments)    Dizziness, made meniere's disease worse   . Hydromorphone   Itching  . Morphine  And Codeine Nausea And Vomiting  . Sulfamethoxazole Other (See Comments)    Causes swelling     Khloei Spiker MOTLEY-MANGRUM, PMHNP

## 2023-11-21 NOTE — ED Notes (Signed)
 Patient resting in bed quietly, advised on placing oxygen  back on. O2 3L [place].

## 2023-11-21 NOTE — Progress Notes (Signed)
   11/21/23 2155  BiPAP/CPAP/SIPAP  BiPAP/CPAP/SIPAP Pt Type Adult  Reason BIPAP/CPAP not in use Non-compliant (Patient has the [facility] Implant)

## 2023-11-21 NOTE — BH Assessment (Signed)
 At 0417, clinician checked in with [facility] and asked for any updates on the assessment time.    Rosi Converse, MS, [facility], [facility] Triage Specialist [phone]

## 2023-11-21 NOTE — Progress Notes (Signed)
 Patient accepted to [facility] on 11/21/2023 pending covid results w/ Bed Assignment: Gero Unit  Pt meets inpatient criteria per:  Chandra Come, PMHNP  Attending Physician will be:  Wendee Halon, MD  Report can be called to:  [phone]  Pt can arrive any time after negative covid test results  Care Team Notified:  Chandra Come, PMHNP,  Jann Melody, RN   [place], [facility] [phone]

## 2023-11-21 NOTE — ED Notes (Signed)
 Sheriff dept called for transport message left on answer service.

## 2023-11-21 NOTE — ED Provider Notes (Signed)
 NP attempted to assess face to face- patient is difficult to arouse, will follow-up when patient is able to participate in assessment. - Recruitment consultant at bedside

## 2023-11-21 NOTE — ED Provider Notes (Signed)
 Emergency Medicine Observation Re-evaluation Note  [name] [name] is a 68 y.o. female, seen on rounds today.  Pt initially presented to the ED for complaints of Altered Mental Status, Urinary Frequency, and Drug Overdose Currently, the patient is calm and cooperative.  Reports mild headache  Physical Exam  BP 134/63 (BP Location: Right Arm)   Pulse 94   Temp 98.1 F (36.7 C) (Oral)   Resp 16   SpO2 92%  Physical Exam General: Awake. Alert. No acute distress Cardiac: Regular rate rhythm Lungs: Clear to auscultation bilaterally Psych: Calm and cooperative  ED Course / MDM  EKG:EKG Interpretation Date/Time:  Wednesday November 20 2023 14:15:13 EDT Ventricular Rate:  82 PR Interval:  159 QRS Duration:  93 QT Interval:  413 QTC Calculation: 483 R Axis:   88  Text Interpretation: Sinus rhythm Borderline right axis deviation Confirmed by Annita Kindle [phone]) on 11/20/2023 2:18:41 PM  I have reviewed the labs performed to date as well as medications administered while in observation.  Recent changes in the last 24 hours include Presentation to the emergency department after reported unintentional overdose with suicidal ideation.  She was medically cleared awaiting TTS evaluation today..  Plan  Current plan is for continued boarding in the ED awaiting TTS evaluation and disposition.    Sallyanne Creamer, DO 11/21/23 [phone]

## 2023-11-22 DIAGNOSIS — E785 Hyperlipidemia, unspecified: Secondary | ICD-10-CM | POA: Diagnosis not present

## 2023-11-22 DIAGNOSIS — F419 Anxiety disorder, unspecified: Secondary | ICD-10-CM | POA: Diagnosis not present

## 2023-11-22 DIAGNOSIS — F1320 Sedative, hypnotic or anxiolytic dependence, uncomplicated: Secondary | ICD-10-CM | POA: Diagnosis not present

## 2023-11-22 DIAGNOSIS — G8929 Other chronic pain: Secondary | ICD-10-CM | POA: Diagnosis not present

## 2023-11-22 DIAGNOSIS — F1590 Other stimulant use, unspecified, uncomplicated: Secondary | ICD-10-CM | POA: Diagnosis not present

## 2023-11-22 DIAGNOSIS — Z885 Allergy status to narcotic agent status: Secondary | ICD-10-CM | POA: Diagnosis not present

## 2023-11-22 DIAGNOSIS — R45851 Suicidal ideations: Secondary | ICD-10-CM | POA: Diagnosis not present

## 2023-11-22 DIAGNOSIS — F319 Bipolar disorder, unspecified: Secondary | ICD-10-CM | POA: Diagnosis not present

## 2023-11-22 DIAGNOSIS — F32A Depression, unspecified: Secondary | ICD-10-CM | POA: Diagnosis not present

## 2023-11-22 DIAGNOSIS — Z8659 Personal history of other mental and behavioral disorders: Secondary | ICD-10-CM | POA: Diagnosis not present

## 2023-11-22 DIAGNOSIS — F1120 Opioid dependence, uncomplicated: Secondary | ICD-10-CM | POA: Diagnosis not present

## 2023-11-22 DIAGNOSIS — K219 Gastro-esophageal reflux disease without esophagitis: Secondary | ICD-10-CM | POA: Diagnosis not present

## 2023-11-22 DIAGNOSIS — F13239 Sedative, hypnotic or anxiolytic dependence with withdrawal, unspecified: Secondary | ICD-10-CM | POA: Diagnosis not present

## 2023-11-22 DIAGNOSIS — Z888 Allergy status to other drugs, medicaments and biological substances status: Secondary | ICD-10-CM | POA: Diagnosis not present

## 2023-11-22 DIAGNOSIS — G4700 Insomnia, unspecified: Secondary | ICD-10-CM | POA: Diagnosis not present

## 2023-11-22 DIAGNOSIS — N390 Urinary tract infection, site not specified: Secondary | ICD-10-CM | POA: Diagnosis not present

## 2023-11-22 DIAGNOSIS — Z882 Allergy status to sulfonamides status: Secondary | ICD-10-CM | POA: Diagnosis not present

## 2023-11-22 DIAGNOSIS — F603 Borderline personality disorder: Secondary | ICD-10-CM | POA: Diagnosis not present

## 2023-11-22 DIAGNOSIS — F1190 Opioid use, unspecified, uncomplicated: Secondary | ICD-10-CM | POA: Diagnosis not present

## 2023-11-22 NOTE — ED Provider Notes (Signed)
 Emergency Medicine Observation Re-evaluation Note  [name] [name] is a 68 y.o. female, seen on rounds today.  Pt initially presented to the ED for complaints of Altered Mental Status, Urinary Frequency, and Drug Overdose Currently, the patient is resting  Physical Exam  BP 136/80 (BP Location: Left Arm)   Pulse 77   Temp 98.3 F (36.8 C) (Oral)   Resp 17   SpO2 100%  Physical Exam General: NAD Cardiac: RR Lungs: non labored Psych: Calm   ED Course / MDM  EKG:EKG Interpretation Date/Time:  Wednesday November 20 2023 14:15:13 EDT Ventricular Rate:  82 PR Interval:  159 QRS Duration:  93 QT Interval:  413 QTC Calculation: 483 R Axis:   88  Text Interpretation: Sinus rhythm Borderline right axis deviation Confirmed by Annita Kindle [phone]) on 11/20/2023 2:18:41 PM  I have reviewed the labs performed to date as well as medications administered while in observation.  Recent changes in the last 24 hours include accepted to [facility].  Plan  Current plan is for inpatient psych; accepted to [facility].    Rolinda Climes, DO 11/22/23 [phone]

## 2023-11-28 DIAGNOSIS — R4182 Altered mental status, unspecified: Secondary | ICD-10-CM | POA: Diagnosis not present

## 2023-12-04 ENCOUNTER — Ambulatory Visit: Admitting: Cardiology

## 2023-12-10 ENCOUNTER — Telehealth: Payer: Self-pay

## 2023-12-10 NOTE — Transitions of Care (Post Inpatient/ED Visit) (Signed)
 12/10/2023  Name: [name] [name] MRN: [id number] DOB: April 30, 1956  Today's TOC FU Call Status: Today's TOC FU Call Status:: Successful TOC FU Call Completed TOC FU Call Complete Date: 12/10/23 Patient's Name and Date of Birth confirmed.  Transition Care Management Follow-up Telephone Call Date of Discharge: 12/09/23 Discharge Facility: Other [facility]) Name of Other (Non-[facility]) Discharge Facility: [facility] Type of Discharge: Inpatient Admission Primary Inpatient Discharge Diagnosis:: overdose How have you been since you were released from the hospital?: Better (Reports doing much better.) Any questions or concerns?: No  Items Reviewed: Did you receive and understand the discharge instructions provided?: Yes Medications obtained,verified, and reconciled?: Yes (Medications Reviewed) Any new allergies since your discharge?: No Dietary orders reviewed?: No (reports gut healthy diet) Do you have support at home?: Yes People in Home [RPT]: child(ren), adult, grandchild(ren) Name of Support/Comfort Primary Source: [name] Johnson  Medications Reviewed Today: Medications Reviewed Today     Reviewed by Rumalda Alan PENNER, RN (Registered Nurse) on 12/10/23 at [id number]  Med List Status: <None>   Medication Order Taking? Sig Documenting Provider Last Dose Status Informant  acetaminophen  (TYLENOL ) 500 MG tablet [id number] Yes Take 500 mg by mouth every 6 (six) hours as needed. [provider]  Active   acyclovir  (ZOVIRAX ) 400 MG tablet [id number] Yes TAKE 1 TABLET BY MOUTH 4 TIMES A DAY AS NEEDED Johnny Garnette LABOR, MD  Active Self  albuterol  (PROVENTIL ) (2.5 MG/3ML) 0.083% nebulizer solution [id number] Yes Take 3 mLs (2.5 mg total) by nebulization every 6 (six) hours as needed for wheezing or shortness of breath. Jude Harden GAILS, MD  Active Self  albuterol  (VENTOLIN  HFA) 108 [phone] Base) MCG/ACT inhaler [id number] Yes Inhale 1-2 puffs into the lungs every 6 (six) hours  as needed. Jude Harden GAILS, MD  Active Self  alprazolam  (XANAX ) 2 MG tablet [id number]  Take 1 tablet (2 mg total) by mouth 3 (three) times daily as needed for anxiety.  Patient not taking: Reported on 12/10/2023   Johnny Garnette LABOR, MD  Active Self  ARIPiprazole (ABILIFY) 10 MG tablet [id number] Yes Take 10 mg by mouth daily. [provider]  Active   Ascorbic Acid (VITAMIN C PO) [id number] Yes Take 500 tablets by mouth daily. [provider]  Active Self  aspirin  EC 81 MG tablet [id number] Yes Take 81 mg by mouth daily. Swallow whole. [provider]  Active Self  Aspirin -Acetaminophen -Caffeine (GOODYS EXTRA STRENGTH PO) [id number] Yes Take 1 packet by mouth daily as needed. [provider]  Active Self  B Complex-C-Folic Acid (B-COMPLEX/VITAMIN C PO) [id number] Yes Take 1 tablet by mouth daily. [provider]  Active Self  Cholecalciferol  (VITAMIN D3) 125 MCG (5000 UT) CAPS [id number] Yes Take 5,000 Units by mouth once a week. [provider]  Active Self  ezetimibe  (ZETIA ) 10 MG tablet [id number] Yes Take 1 tablet (10 mg total) by mouth daily. Shlomo Wilbert SAUNDERS, MD  Active Self  Fluticasone -Umeclidin-Vilant (TRELEGY ELLIPTA ) 100-62.5-25 MCG/ACT AEPB [id number] Yes Inhale 1 puff into the lungs daily. Jude Harden GAILS, MD  Active Self  hydrocortisone  (ANUSOL -HC) 2.5 % rectal cream [id number] Yes PLACE 1 APPLICATION RECTALLY 2 TIMES DAILY. Johnny Garnette LABOR, MD  Active Self  Magnesium  500 MG CAPS [id number] Yes Take 500 mg by mouth daily. [provider]  Active Self           Med Note (COX, CANDICE A   Tue May 01, 2022 10:29 AM)    Multiple Vitamins-Minerals (MULTIVITAMIN  WITH MINERALS) tablet [id number] Yes Take 1 tablet by mouth every evening. [provider]  Active Self  omeprazole  (PRILOSEC) 20 MG capsule [id number] Yes TAKE 1 CAPSULE BY MOUTH 2 TIMES DAILY BEFORE A MEAL. Johnny Garnette LABOR, MD  Active Self  Oxycodone  HCl 10 MG TABS [id number]  Take  1 tablet (10 mg total) by mouth every 6 (six) hours as needed (pain).  Patient not taking: Reported on 12/10/2023   Johnny Garnette LABOR, MD  Active Self  Oxycodone  HCl 10 MG TABS [id number]  Take 1 tablet (10 mg total) by mouth every 6 (six) hours as needed (pain).  Patient not taking: Reported on 12/10/2023   Johnny Garnette LABOR, MD  Active Self           Med Note ALLEGRA, [facility] I   Thu Nov 21, 2023  1:55 AM) Duplicate  Oxycodone  HCl 10 MG TABS [id number]  Take 1 tablet (10 mg total) by mouth every 6 (six) hours as needed (pain).  Patient not taking: Reported on 12/10/2023   Johnny Garnette LABOR, MD  Active Self           Med Note ALLEGRA, [facility] I   Thu Nov 21, 2023  1:55 AM) Duplicate   polyethylene glycol (MIRALAX  / GLYCOLAX ) 17 g packet [id number] Yes Use one packet daily Johnny Garnette LABOR, MD  Active Self  potassium chloride  (KLOR-CON ) 10 MEQ tablet [id number] Yes TAKE 1 TABLET (10 MEQ TOTAL) BY MOUTH DAILY. Johnny Garnette LABOR, MD  Active Self  rosuvastatin  (CRESTOR ) 40 MG tablet [id number] Yes TAKE 1 TABLET BY MOUTH DAILY. Johnny Garnette LABOR, MD  Active Self            Home Care and Equipment/Supplies: Were Home Health Services Ordered?: No Any new equipment or medical supplies ordered?: No  Functional Questionnaire: Do you need assistance with bathing/showering or dressing?: No Do you need assistance with meal preparation?: No Do you need assistance with eating?: No Do you have difficulty maintaining continence: No Do you need assistance with getting out of bed/getting out of a chair/moving?: No Do you have difficulty managing or taking your medications?: Yes ([name] daughter)  Follow up appointments reviewed: PCP Follow-up appointment confirmed?: No (patient reports she will call today an make appointments.) MD Provider Line Number:[phone] Given: No Specialist Hospital Follow-up appointment confirmed?: No Reason Specialist Follow-Up Not Confirmed: Patient has Specialist Provider Number and  will Call for Appointment Do you need transportation to your follow-up appointment?: No Do you understand care options if your condition(s) worsen?: Yes-patient verbalized understanding  SDOH Interventions Today    Flowsheet Row Most Recent Value  SDOH Interventions   Food Insecurity Interventions Intervention Not Indicated  Housing Interventions Intervention Not Indicated  Utilities Interventions Intervention Not Indicated   Placed call to patient and reviewed discharge from [facility].  I am not able to visualize instructions from this outside hospital. Patient reports that she has her instructions. She was able to verbalize to me her medication changes.  States that her daughter, Greig has set up her pill box.  Patient reports that she is off Xanax  and oxycodone .  Reports that she is feeling better. Reports hallucinations for 2 weeks. Denies suicidal or homicidal thoughts at this time. Has an existing therapist and psychiatrists at [facility] in [place]. Reports she will make follow up appointments today. Reports that she can tell a difference being on Abilify.   Interventions: Reviewed importance of follow up with PCP and mental health support  team. Patient reports she will call today and make appointments. Reviewed caution about taking aspirin , good powders, and advil. Reviewed risk of bleeding. Reviewed need for emergency calls if feeling SI or HI.  Reviewed importance of taking all medications exactly as prescribed.  Reviewed fall prevention and use of cane or walker.   Reviewed and offered 30 day TOC program and patient declined. Provided my contact information for patient to call me if needed. She voiced understanding.   Alan Ee, RN, BSN, CEN [employer]- Transition of Care Team.  [facility] [phone]

## 2023-12-12 ENCOUNTER — Telehealth: Payer: Self-pay | Admitting: Cardiology

## 2023-12-12 DIAGNOSIS — I25118 Atherosclerotic heart disease of native coronary artery with other forms of angina pectoris: Secondary | ICD-10-CM

## 2023-12-12 DIAGNOSIS — G4733 Obstructive sleep apnea (adult) (pediatric): Secondary | ICD-10-CM

## 2023-12-12 DIAGNOSIS — I10 Essential (primary) hypertension: Secondary | ICD-10-CM

## 2023-12-12 NOTE — Telephone Encounter (Signed)
 New Message:     Patient wants to talk to Brad. She says she needs Brad help in getting her prescription. She said Brad is always so helpful when ever she needs something.

## 2023-12-13 ENCOUNTER — Ambulatory Visit: Admitting: Family Medicine

## 2023-12-13 ENCOUNTER — Telehealth: Payer: Self-pay

## 2023-12-13 ENCOUNTER — Encounter: Payer: Self-pay | Admitting: Family Medicine

## 2023-12-13 VITALS — BP 130/78 | HR 87 | Temp 98.3°F | Wt 190.0 lb

## 2023-12-13 DIAGNOSIS — G4700 Insomnia, unspecified: Secondary | ICD-10-CM

## 2023-12-13 DIAGNOSIS — R4182 Altered mental status, unspecified: Secondary | ICD-10-CM

## 2023-12-13 DIAGNOSIS — T40604A Poisoning by unspecified narcotics, undetermined, initial encounter: Secondary | ICD-10-CM | POA: Diagnosis not present

## 2023-12-13 DIAGNOSIS — T40601A Poisoning by unspecified narcotics, accidental (unintentional), initial encounter: Secondary | ICD-10-CM | POA: Insufficient documentation

## 2023-12-13 DIAGNOSIS — N390 Urinary tract infection, site not specified: Secondary | ICD-10-CM

## 2023-12-13 DIAGNOSIS — F314 Bipolar disorder, current episode depressed, severe, without psychotic features: Secondary | ICD-10-CM

## 2023-12-13 MED ORDER — DULOXETINE HCL 30 MG PO CPEP
30.0000 mg | ORAL_CAPSULE | Freq: Every day | ORAL | 5 refills | Status: DC
Start: 2023-12-13 — End: 2024-04-01

## 2023-12-13 MED ORDER — ALPRAZOLAM 1 MG PO TABS: 1.0000 mg | ORAL_TABLET | Freq: Every evening | ORAL | 2 refills | Status: DC | PRN

## 2023-12-13 NOTE — Telephone Encounter (Unsigned)
 Copied from CRM [id number]. Topic: Clinical - Prescription Issue >> Dec 13, 2023  2:07 PM Thersia BROCKS wrote: Reason for CRM: [facility] called in wanted to confirm that it was a decreased on the current that is 2 mg to 1 mg, Pharmacist stated they will discontinue the 2mg  and have the 1 mg on file now  Any questions or concerns please call back

## 2023-12-13 NOTE — Progress Notes (Addendum)
   Subjective:    Patient ID: [name] [name], female    DOB: [birth year], 68 y.o.   MRN: [id number]  HPI Here with her husband for a transitional care visit to follow up a hospital stay from 11-20-23 to 11-22-23 for a narcotic overdose. On the day of admission her daughter found her lying on the kitchen floor in an altered mental state. She was taken to the ED where it was determined that she had taken too many Oxycodone  tablets. She admits that she had been very depressed about her husband leaving her, but it was unclear whether the overdose was intentional or not. She was placed in IVC and treated. Her labs were remarkable for a Hgb of 11.1, a creatinine of 0.74, and a UA with bacteria in it. She was treated for the UTI, and now she has no urinary symptoms (I cannot see where any culture was done). She had CT scans of the head and cervical spine which were unremarkable. She was then transferred to a rehab facility named [facility] in [place]  where she stayed for 28 days. She was taken off Oxycodone  and Xanax , and she was started on Cymbalta  30 mg daily for bipolar disorder and Trazodone for sleep. She says the Cymbalta  is working very well, but she cannot sleep at all. She asks if she could go back on a small dose of Xanax  for sleep. Her appetite is good.    Review of Systems  Constitutional: Negative.   Respiratory: Negative.    Cardiovascular: Negative.   Gastrointestinal: Negative.   Genitourinary: Negative.   Neurological: Negative.   Psychiatric/Behavioral:  Positive for sleep disturbance. Negative for agitation, behavioral problems, confusion, dysphoric mood, hallucinations, self-injury and suicidal ideas. The patient is not nervous/anxious.        Objective:   Physical Exam Constitutional:      Appearance: Normal appearance.   Cardiovascular:     Rate and Rhythm: Normal rate and regular rhythm.     Pulses: Normal pulses.     Heart sounds: Normal heart sounds.   Pulmonary:     Effort: Pulmonary effort is normal.     Breath sounds: Normal breath sounds.   Neurological:     Mental Status: She is alert and oriented to person, place, and time.   Psychiatric:        Mood and Affect: Mood normal.        Behavior: Behavior normal.        Thought Content: Thought content normal.           Assessment & Plan:  She has recovered from an overdose of narcotic medication, and she has stopped using these completely. Her pain is now controlled using Tylenol . Her bipolar disorder is doing well on Duloxetine . For insomnia, she will try Xanax  1 mg at bedtime. She will avoid taking this at all in the daytime. We spent a total of ( 35  ) minutes reviewing records and discussing these issues.  Garnette Olmsted, MD

## 2023-12-16 NOTE — Telephone Encounter (Signed)
 John called stating patient needs prescription for a new remote to her inspire.

## 2023-12-16 NOTE — Telephone Encounter (Signed)
 Pt calling back to speak with Brad. Please advise

## 2023-12-17 ENCOUNTER — Telehealth: Payer: Self-pay | Admitting: *Deleted

## 2023-12-17 DIAGNOSIS — J9611 Chronic respiratory failure with hypoxia: Secondary | ICD-10-CM | POA: Diagnosis not present

## 2023-12-17 MED ORDER — ARIPIPRAZOLE 10 MG PO TABS: 10.0000 mg | ORAL_TABLET | Freq: Every day | ORAL | Status: DC

## 2023-12-17 NOTE — Telephone Encounter (Signed)
 Spoke with pt pharmacy advised that Dr Johnny changed the Rx to a lower dose

## 2023-12-17 NOTE — Telephone Encounter (Signed)
 Communication  Reason for CRM: Patient called in stating that she wanted to tell Dr. Johnny the name of the medication the generic is for cymbalta 30mg . The other medication that Dr, Johnny asked her about was Omperazole (generic for abilify) 10mg , and ezetimibe   for heartburn. Patient does not need a call back, and just wanted to provide the information.

## 2023-12-17 NOTE — Telephone Encounter (Signed)
 Noted

## 2023-12-17 NOTE — Telephone Encounter (Signed)
 Late Entry: Patient spouse called the office asking for an Rx for an inspire remote for his wife because after having a procedure at [facility] her remote got misplaced and damaged and she has been having a difficult time sleeping without it.  They were advised by [facility] to call her providers office and get a Rx sent to [facility] for a [phone] Sleep Remote -Model # [id number]. Message was sent to Dr Shlomo to write the Rx or call [phone] to get Health [email or web address] Patient can submitt for reimbursement to her insurance with these codes -[phone] and/or [phone].patient was grateful for the call.

## 2023-12-19 ENCOUNTER — Telehealth: Payer: Self-pay | Admitting: Family Medicine

## 2023-12-19 NOTE — Telephone Encounter (Signed)
 Copied from CRM [id number]. Topic: Clinical - Prescription Issue >> Dec 19, 2023  9:20 AM [name] [name] wrote: Reason for CRM: Patient called in stating that [name] [name] Alprazolam  rx is not working. She wanted to know if she could get an increase to 3mg  instead to help [name] [name] sleep. Patient is requesting call back at [phone].

## 2023-12-23 ENCOUNTER — Ambulatory Visit (HOSPITAL_BASED_OUTPATIENT_CLINIC_OR_DEPARTMENT_OTHER)
Admission: RE | Admit: 2023-12-23 | Discharge: 2023-12-23 | Disposition: A | Discharge status: Home / Self Care | Source: Ambulatory Visit | Attending: Adult Health | Admitting: Adult Health

## 2023-12-23 DIAGNOSIS — J439 Emphysema, unspecified: Secondary | ICD-10-CM | POA: Diagnosis not present

## 2023-12-23 DIAGNOSIS — J849 Interstitial pulmonary disease, unspecified: Secondary | ICD-10-CM | POA: Insufficient documentation

## 2023-12-23 DIAGNOSIS — K449 Diaphragmatic hernia without obstruction or gangrene: Secondary | ICD-10-CM | POA: Diagnosis not present

## 2023-12-23 DIAGNOSIS — I700 Atherosclerosis of aorta: Secondary | ICD-10-CM | POA: Diagnosis not present

## 2023-12-23 MED ORDER — ALPRAZOLAM 2 MG PO TABS: 2.0000 mg | ORAL_TABLET | Freq: Every evening | ORAL | 2 refills | Status: DC | PRN

## 2023-12-23 NOTE — Telephone Encounter (Signed)
 Contact pt to update. Pt reports she in a radiology office, request to call back. Will call pt back.

## 2023-12-23 NOTE — Telephone Encounter (Signed)
 Clifford thanks

## 2023-12-23 NOTE — Telephone Encounter (Signed)
 I sent in for 2 mg of Xanax  to take at bedtime, but I will not go any higher on the dosage

## 2023-12-23 NOTE — Addendum Note (Signed)
 Addended by: JOHNNY SENIOR A on: 12/23/2023 11:16 AM   Modules accepted: Orders

## 2023-12-25 ENCOUNTER — Encounter: Payer: Self-pay | Admitting: Family Medicine

## 2023-12-25 ENCOUNTER — Ambulatory Visit (INDEPENDENT_AMBULATORY_CARE_PROVIDER_SITE_OTHER): Admitting: Family Medicine

## 2023-12-25 VITALS — BP 142/82 | HR 90 | Temp 98.2°F | Ht 62.0 in | Wt 187.0 lb

## 2023-12-25 DIAGNOSIS — G629 Polyneuropathy, unspecified: Secondary | ICD-10-CM | POA: Diagnosis not present

## 2023-12-25 DIAGNOSIS — E039 Hypothyroidism, unspecified: Secondary | ICD-10-CM

## 2023-12-25 DIAGNOSIS — E538 Deficiency of other specified B group vitamins: Secondary | ICD-10-CM | POA: Diagnosis not present

## 2023-12-25 DIAGNOSIS — R739 Hyperglycemia, unspecified: Secondary | ICD-10-CM | POA: Diagnosis not present

## 2023-12-25 LAB — BASIC METABOLIC PANEL WITH GFR
BUN: 19 mg/dL (ref 6–23)
CO2: 29 meq/L (ref 19–32)
Calcium: 9.8 mg/dL (ref 8.4–10.5)
Chloride: 104 meq/L (ref 96–112)
Creatinine, Ser: 0.72 mg/dL (ref 0.40–1.20)
GFR: 85.85 mL/min (ref >60.00)
Glucose, Bld: 100 mg/dL — ABNORMAL HIGH (ref 70–99)
Potassium: 3.9 meq/L (ref 3.5–5.1)
Sodium: 140 meq/L (ref 135–145)

## 2023-12-25 LAB — TSH: TSH: 2.68 u[IU]/mL (ref 0.35–5.50)

## 2023-12-25 LAB — VITAMIN B12: Vitamin B-12: 316 pg/mL (ref 211–911)

## 2023-12-25 LAB — HEMOGLOBIN A1C: Hgb A1c MFr Bld: 6.7 % — ABNORMAL HIGH (ref 4.6–6.5)

## 2023-12-25 NOTE — Progress Notes (Signed)
   Subjective:    Patient ID: [name] [name], female    DOB: Jan 21, 1956, 68 y.o.   MRN: [id number]  HPI Here to discuss intermittent numbness or tingling or pain in the bottoms of both feet. This started about 3 months ago. The hands are not involved.    Review of Systems  Constitutional: Negative.   Respiratory: Negative.    Cardiovascular: Negative.   Neurological:  Positive for numbness.       Objective:   Physical Exam Constitutional:      Appearance: Normal appearance.  Cardiovascular:     Rate and Rhythm: Normal rate and regular rhythm.     Pulses: Normal pulses.     Heart sounds: Normal heart sounds.  Pulmonary:     Effort: Pulmonary effort is normal.     Breath sounds: Normal breath sounds.  Neurological:     Mental Status: She is alert and oriented to person, place, and time. Mental status is at baseline.           Assessment & Plan:  She has developed a neuropathy. We will check labs today to investigate this. She already drinks plenty of water  every day. Garnette Olmsted, MD

## 2023-12-26 ENCOUNTER — Ambulatory Visit: Payer: Self-pay | Admitting: Adult Health

## 2023-12-27 ENCOUNTER — Ambulatory Visit: Payer: Self-pay | Admitting: Family Medicine

## 2023-12-27 NOTE — Telephone Encounter (Signed)
 Spoke to pt. Pt reports she is aware and that she was seen in office on 12/25/2023. No further action is needed.

## 2023-12-27 NOTE — Progress Notes (Signed)
 Correction: already spoke to pt and went over lab result. Pt has no further questions.

## 2024-01-03 ENCOUNTER — Other Ambulatory Visit: Payer: Self-pay | Admitting: Family Medicine

## 2024-01-03 ENCOUNTER — Ambulatory Visit: Payer: Self-pay

## 2024-01-03 MED ORDER — IBUPROFEN 800 MG PO TABS: 800.0000 mg | ORAL_TABLET | Freq: Three times a day (TID) | ORAL | 5 refills | Status: AC | PRN

## 2024-01-03 NOTE — Telephone Encounter (Signed)
 Done

## 2024-01-03 NOTE — Telephone Encounter (Signed)
 Pt states that she is currently taking 4 of the OTC ibuprofen twice a day and is asking for a prescription for the 800mg  tabs so that she doesn't have to take so many pills. Pt also states that she is taking a Goodies powder with the ibuprofen.

## 2024-01-03 NOTE — Telephone Encounter (Signed)
 Pt notified & voiced understanding.

## 2024-01-03 NOTE — Telephone Encounter (Signed)
 FYI Only or Action Required?: Action required by provider: medication refill request and update on patient condition.  Patient was last seen in primary care on 12/25/2023 by [name] [name] LABOR, MD.  Called Nurse Triage reporting Generalized Body Aches.  Symptoms began several months ago.  Interventions attempted: OTC medications: ibuprofen and goodies powder.  Symptoms are: gradually worsening.  Triage Disposition: See PCP Within 2 Weeks  Patient/caregiver understands and will follow disposition?: Yes           Copied from CRM [id number]. Topic: Clinical - Red Word Triage >> Jan 03, 2024  9:07 AM [name] [name] wrote: Red Word that prompted transfer to Nurse Triage:  Patient is experiencing severe pain in her hands, back, toes, and fingers. Patient has Interstitial lung disease and had some questions about taking ibuprofen. A nurse told the patient told her that tylenol  would too hard on her liver Reason for Disposition  Body pains are a chronic symptom (recurrent or ongoing AND present > 4 weeks)  Answer Assessment - Initial Assessment Questions 1. ONSET: When did the muscle aches or body pains start?     ongoing 2. LOCATION: What part of your body is hurting? (e.g., entire body, arms, legs)      Entire body 3. SEVERITY: How bad is the pain? (Scale 1-10; or mild, moderate, severe)     8/10 4. CAUSE: What do you think is causing the pains?     Neuropathy  5. FEVER: Do you have a fever? If Yes, ask: What is your temperature, how was it measured, and  when did it start?      no 6. OTHER SYMPTOMS: Do you have any other symptoms? (e.g., chest pain, cold or flu symptoms, rash, weakness, weight loss)     no  Protocols used: Muscle Aches and Body Pain-A-AH

## 2024-01-10 ENCOUNTER — Other Ambulatory Visit: Payer: Self-pay | Admitting: Family Medicine

## 2024-01-10 MED ORDER — ARIPIPRAZOLE 10 MG PO TABS: 10.0000 mg | ORAL_TABLET | Freq: Every day | ORAL | 0 refills | Status: DC

## 2024-01-10 NOTE — Telephone Encounter (Signed)
 Copied from CRM [id number]. Topic: Clinical - Medication Refill >> Jan 10, 2024  9:16 AM [name] wrote: Medication: ARIPiprazole  (ABILIFY ) 10 MG tablet (It was prescribed to her when she was in the hospital and the pharmacy tried to reach out to the provider but no response)  Has the patient contacted their pharmacy? Yes (Agent: If no, request that the patient contact the pharmacy for the refill. If patient does not wish to contact the pharmacy document the reason why and proceed with request.) (Agent: If yes, when and what did the pharmacy advise?)  This is the patient's preferred pharmacy:  [facility] - [place], [place] - [address] [place] [address] Phone: [phone] Fax: [phone]  Is this the correct pharmacy for this prescription? Yes If no, delete pharmacy and type the correct one.   Has the prescription been filled recently? No  Is the patient out of the medication? Yes  Has the patient been seen for an appointment in the last year OR does the patient have an upcoming appointment? Yes  Can we respond through MyChart? Yes  Agent: Please be advised that Rx refills may take up to 3 business days. We ask that you follow-up with your pharmacy.

## 2024-01-17 DIAGNOSIS — J9611 Chronic respiratory failure with hypoxia: Secondary | ICD-10-CM | POA: Diagnosis not present

## 2024-02-03 ENCOUNTER — Other Ambulatory Visit: Payer: Self-pay | Admitting: Family Medicine

## 2024-02-17 DIAGNOSIS — J9611 Chronic respiratory failure with hypoxia: Secondary | ICD-10-CM | POA: Diagnosis not present

## 2024-02-19 ENCOUNTER — Ambulatory Visit (INDEPENDENT_AMBULATORY_CARE_PROVIDER_SITE_OTHER): Admitting: Family Medicine

## 2024-02-19 ENCOUNTER — Encounter: Payer: Self-pay | Admitting: Family Medicine

## 2024-02-19 VITALS — BP 118/64 | HR 95 | Temp 98.3°F | Wt 193.0 lb

## 2024-02-19 DIAGNOSIS — N3281 Overactive bladder: Secondary | ICD-10-CM | POA: Diagnosis not present

## 2024-02-19 MED ORDER — OXYBUTYNIN CHLORIDE ER 10 MG PO TB24: 10.0000 mg | ORAL_TABLET | Freq: Every day | ORAL | 3 refills | Status: DC

## 2024-02-19 NOTE — Progress Notes (Signed)
   Subjective:    Patient ID: [name] [name], female    DOB: [birth year], 68 y.o.   MRN: [id number]  HPI Here to discuss bladder issues that started about 6 months ago. She has urgency to urinate and sometimes cannot make it to the bathroom without leaking some urine. This also happens when she coughs or sneezes. She has been wearing protective undergarments    Review of Systems  Constitutional: Negative.   Respiratory: Negative.    Cardiovascular: Negative.   Genitourinary:  Positive for urgency. Negative for dysuria and frequency.       Objective:   Physical Exam Constitutional:      Appearance: Normal appearance.  Cardiovascular:     Rate and Rhythm: Normal rate and regular rhythm.     Pulses: Normal pulses.     Heart sounds: Normal heart sounds.  Pulmonary:     Effort: Pulmonary effort is normal.     Breath sounds: Normal breath sounds.  Neurological:     Mental Status: She is alert.           Assessment & Plan:  OAB, she will try Oxybutynin  XR 10 mg at bedtime. Report back in 3 weeks . Garnette Olmsted, MD

## 2024-03-03 ENCOUNTER — Telehealth: Payer: Self-pay

## 2024-03-03 NOTE — Progress Notes (Signed)
   03/03/2024  Patient ID: [name] [name], female   DOB: 31-Aug-1955, 68 y.o.   MRN: [id number]  Pharmacy Quality Measure Review  This patient is appearing on a report for being at risk of failing the adherence measure for cholesterol (statin) medications this calendar year.   Medication: Rosuvastatin  40mg  Last fill date: 10/28/23 for 90 day supply  Spoke with patient via phone. She plans to contact pharmacy to request a refill today.  Jon VEAR Lindau, PharmD Clinical Pharmacist [phone]

## 2024-03-05 ENCOUNTER — Ambulatory Visit (HOSPITAL_BASED_OUTPATIENT_CLINIC_OR_DEPARTMENT_OTHER): Admitting: Pulmonary Disease

## 2024-03-05 ENCOUNTER — Encounter (HOSPITAL_BASED_OUTPATIENT_CLINIC_OR_DEPARTMENT_OTHER): Payer: Self-pay | Admitting: Pulmonary Disease

## 2024-03-05 VITALS — BP 110/70 | HR 80 | Ht 62.0 in | Wt 195.5 lb

## 2024-03-05 DIAGNOSIS — J9611 Chronic respiratory failure with hypoxia: Secondary | ICD-10-CM

## 2024-03-05 DIAGNOSIS — G4700 Insomnia, unspecified: Secondary | ICD-10-CM | POA: Diagnosis not present

## 2024-03-05 DIAGNOSIS — J849 Interstitial pulmonary disease, unspecified: Secondary | ICD-10-CM | POA: Diagnosis not present

## 2024-03-05 DIAGNOSIS — R079 Chest pain, unspecified: Secondary | ICD-10-CM

## 2024-03-05 DIAGNOSIS — Z23 Encounter for immunization: Secondary | ICD-10-CM

## 2024-03-05 DIAGNOSIS — G4733 Obstructive sleep apnea (adult) (pediatric): Secondary | ICD-10-CM | POA: Diagnosis not present

## 2024-03-05 NOTE — Patient Instructions (Signed)
 X amb sat  X flu shot  We discussed medication called perfenidone

## 2024-03-05 NOTE — Progress Notes (Signed)
 Subjective:    Patient ID: [name] [name], female    DOB: 08-01-1955, 68 y.o.   MRN: [id number]   68 yo exsmoker for follow-up of emphysema, ILD and pulmonary nodule OSA - -hypoglossal nerve stimulator implant by Dr. Carlie on 03/25/2019.   -Currently set at level 3=1.4 V (Dr Shlomo 08/2022)   Appears to date back to 05/2016 on CT abdomen She smoked for about 15 pack years, a pack would last for 3 days until she quit in July 2022.  Serology in the remote past in 2008 was negative ANA, RA factor, ACE level was 46  04/2023  rheumatology consultation DR Jeannetta, labs were repeated, RNP remains borderline but he does not feel that she has any kind of inflammation/MCTD    PMH - ADHD, bipolar disorder, hypertension   09/2022 On ambulation she desaturated from 96 to 83% after 1 lap and was placed on 2 L pulse and was able to maintain oxygen  saturation >> started on O2 -discussed antifibrotic's with her and side effect profile. She would like to avoid it at this point unless there is definite indication of disease progression    11/7 call - bilateral chest discomfort that radiates into her ribs and down her back, nasal congestion,prod cough with white sputum, increased SOB with exertion, wheezing x1wk She was given Z-Pak and prednisone , tested negative for COVID She wears 2L at bedtime and PRN. Spo2 has dropped as low as 82% with exertion but recovered to 90% with rest.  Discussed the use of AI scribe software for clinical note transcription with the patient, who gave verbal consent to proceed.  History of Present Illness  Discussed the use of AI scribe software for clinical note transcription with the patient, who gave verbal consent to proceed.  History of Present Illness   [name] [name] is a 68 year old female with emphysema, interstitial lung disease (ILD), and obstructive sleep apnea (OSA) who presents for follow-up. She is accompanied by a family member who is involved in her  care.  She experiences persistent chest tightness, described as a corset-like sensation around the bra strap area, present for six months. Significant fatigue is noted, exacerbated by frequent hospital visits for her grandson.  She has emphysema and ILD with fibrosis, with previous CT scans indicating some lung scarring and minor progression. She is not on antifibrotic medication due to potential side effects.  She uses an oxygen  generator at home but lacks a portable oxygen  concentrator for outside use. She has an Inspire device for OSA but experiences difficulty sleeping, often falling asleep late and waking early, contributing to her fatigue.      O2 sat dropped to 88% on walking & recovered with 2 L [place]    Significant tests/ events reviewed   Serology 06/2022 ENA RNP + 1.0  LHC 06/2022 LVEDP 25, non obs CAD   PFTs 05/2022 no airway obstruction, ratio 84, FEV1 100%, FVC 91%, TLC 99%, DLCO 13.73/74%   12/2023 HRCT chest >> UIP pattern, stable 06/2022  HRCT chest>> probable UIP pattern, left upper lobe nodule unchanged   HRCT 01/2022 >> emphysema, probable UIP, dates back to 05/2016, unchanged left upper lobe nodule   CT chest 05/2021 showed emphysema, 13 mm left upper lobe nodule peripheral and bibasilar and peripheral reticular markings, 11 mm right paratracheal lymph node    CT chest with contrast 09/2021 -showed unchanged left upper lobe nodule and similar changes of ILD.   CT angiogram chest 11/2021 showed  right paratracheal lymph node 1.3 cm, unchanged nodule and similar peripheral reticular opacities   04/2020 Inspire titration  07/2021 HST showed AHI of 18/hour on 2.7 V?  Overstimulation HST 11/2021 (on Inspire) mild, AHI 7/h ,low desat 86% on 1.8 V  Review of Systems  neg for any significant sore throat, dysphagia, itching, sneezing, nasal congestion or excess/ purulent secretions, fever, chills, sweats, unintended wt loss, pleuritic or exertional cp, hempoptysis, orthopnea pnd  or change in chronic leg swelling. Also denies presyncope, palpitations, heartburn, abdominal pain, nausea, vomiting, diarrhea or change in bowel or urinary habits, dysuria,hematuria, rash, arthralgias, visual complaints, headache, numbness weakness or ataxia.      Objective:   Physical Exam  Gen. Pleasant, obese, in no distress ENT - no lesions, no post nasal drip Neck: No JVD, no thyromegaly, no carotid bruits Lungs: no use of accessory muscles, no dullness to percussion, decreased without rales or rhonchi  Cardiovascular: Rhythm regular, heart sounds  normal, no murmurs or gallops, no peripheral edema Musculoskeletal: No deformities, no cyanosis or clubbing , no tremors       Assessment & Plan:   Assessment and Plan Assessment & Plan  Assessment and Plan    Emphysema and Interstitial Lung Disease with Pulmonary Fibrosis Chronic condition with minor progression of pulmonary fibrosis over several years. Recent CT scans show no significant progression. Discussed potential treatment with antifibrotic medications, which have side effects such as diarrhea and abdominal pain. Alternative medication with photosensitivity as a side effect was considered. She prefers to avoid sunlight, making this option viable. Decision to hold off on medication due to potential burden and side effects. Uncertainty about future progression of fibrosis was discussed. - Order annual CT scan to monitor fibrosis progression - Provide reading material on antifibrotic medications - Reassess need for antifibrotic medication in the future - she has mental health issues & pill burden is high already & side effcet profile of ofev is not acceptable  CHronic resp failure with hypoxia - send Rx for POC 2 L to DME  Chest Tightness Chronic chest tightness for six months, described as feeling like wearing a corset. Persistent and possibly related to interstitial lung disease and loss of lung elasticity. - Perform walk  test to assess oxygen  levels and need for portable oxygen  concentrator - Coordinate with [employer] for portable oxygen  concentrator if qualified  Obstructive Sleep Apnea Status Post Inspire Device Status post Inspire device placement for obstructive sleep apnea. Device was tampered with during a recent hospitalization, requiring a new remote. Current device is functioning but requires fine-tuning. She reports variable sleep quality. - Schedule appointment to check and fine-tune Inspire device - Coordinate with Inspire technicians for device assessment  Insomnia Chronic insomnia exacerbated by recent stress and caregiving responsibilities. Reports difficulty falling asleep and poor sleep quality despite using Inspire device and oxygen  therapy.

## 2024-03-10 ENCOUNTER — Other Ambulatory Visit (HOSPITAL_BASED_OUTPATIENT_CLINIC_OR_DEPARTMENT_OTHER): Payer: Self-pay | Admitting: Pulmonary Disease

## 2024-03-18 DIAGNOSIS — J9611 Chronic respiratory failure with hypoxia: Secondary | ICD-10-CM | POA: Diagnosis not present

## 2024-03-24 ENCOUNTER — Other Ambulatory Visit: Payer: Self-pay | Admitting: Family Medicine

## 2024-03-25 ENCOUNTER — Other Ambulatory Visit: Payer: Self-pay | Admitting: Family Medicine

## 2024-03-25 NOTE — Telephone Encounter (Unsigned)
 Copied from CRM #[id number]. Topic: Clinical - Medication Refill >> Mar 25, 2024  2:41 PM [name] wrote: Medication: alprazolam  (XANAX ) 2 MG tablet  Has the patient contacted their pharmacy? Yes (Agent: If no, request that the patient contact the pharmacy for the refill. If patient does not wish to contact the pharmacy document the reason why and proceed with request.) (Agent: If yes, when and what did the pharmacy advise?)  This is the patient's preferred pharmacy:  [facility] - [place], [place] - [address] [place] [place] [address] Phone: [phone] Fax: [phone]  Is this the correct pharmacy for this prescription? Yes If no, delete pharmacy and type the correct one.   Has the prescription been filled recently? Yes  Is the patient out of the medication? Yes  Has the patient been seen for an appointment in the last year OR does the patient have an upcoming appointment? Yes  Can we respond through MyChart? Yes  Agent: Please be advised that Rx refills may take up to 3 business days. We ask that you follow-up with your pharmacy.

## 2024-03-26 ENCOUNTER — Telehealth: Payer: Self-pay | Admitting: Family Medicine

## 2024-03-26 NOTE — Telephone Encounter (Signed)
 Copied from CRM #[id number]. Topic: Clinical - Medication Refill >> Mar 25, 2024  2:41 PM [name] wrote: Medication: alprazolam  (XANAX ) 2 MG tablet  Has the patient contacted their pharmacy? Yes (Agent: If no, request that the patient contact the pharmacy for the refill. If patient does not wish to contact the pharmacy document the reason why and proceed with request.) (Agent: If yes, when and what did the pharmacy advise?)  This is the patient's preferred pharmacy:  [facility] - [place], [place] - [address] [place] [place] [address] Phone: [phone] Fax: [phone]  Is this the correct pharmacy for this prescription? Yes If no, delete pharmacy and type the correct one.   Has the prescription been filled recently? Yes  Is the patient out of the medication? Yes  Has the patient been seen for an appointment in the last year OR does the patient have an upcoming appointment? Yes  Can we respond through MyChart? Yes  Agent: Please be advised that Rx refills may take up to 3 business days. We ask that you follow-up with your pharmacy.

## 2024-03-26 NOTE — Telephone Encounter (Signed)
 Sent to pt pharmacy this morning

## 2024-04-01 ENCOUNTER — Ambulatory Visit: Admitting: Family Medicine

## 2024-04-01 ENCOUNTER — Encounter: Payer: Self-pay | Admitting: Family Medicine

## 2024-04-01 VITALS — BP 110/70 | HR 87 | Temp 98.2°F | Wt 198.0 lb

## 2024-04-01 DIAGNOSIS — E669 Obesity, unspecified: Secondary | ICD-10-CM | POA: Diagnosis not present

## 2024-04-01 DIAGNOSIS — F411 Generalized anxiety disorder: Secondary | ICD-10-CM

## 2024-04-01 DIAGNOSIS — E1165 Type 2 diabetes mellitus with hyperglycemia: Secondary | ICD-10-CM

## 2024-04-01 DIAGNOSIS — F314 Bipolar disorder, current episode depressed, severe, without psychotic features: Secondary | ICD-10-CM

## 2024-04-01 DIAGNOSIS — N3281 Overactive bladder: Secondary | ICD-10-CM | POA: Diagnosis not present

## 2024-04-01 LAB — POCT GLYCOSYLATED HEMOGLOBIN (HGB A1C): Hemoglobin A1C: 5.8 % — AB (ref 4.0–5.6)

## 2024-04-01 MED ORDER — DULOXETINE HCL 20 MG PO CPEP: 20.0000 mg | ORAL_CAPSULE | Freq: Every day | ORAL | 5 refills | Status: AC

## 2024-04-01 MED ORDER — OZEMPIC (0.25 OR 0.5 MG/DOSE) 2 MG/3ML ~~LOC~~ SOPN: 0.2500 mg | PEN_INJECTOR | SUBCUTANEOUS | 5 refills | Status: AC

## 2024-04-01 MED ORDER — SOLIFENACIN SUCCINATE 10 MG PO TABS: 10.0000 mg | ORAL_TABLET | Freq: Every day | ORAL | 5 refills | Status: AC

## 2024-04-01 MED ORDER — ALPRAZOLAM 2 MG PO TABS: 2.0000 mg | ORAL_TABLET | Freq: Three times a day (TID) | ORAL | 5 refills | Status: AC | PRN

## 2024-04-01 NOTE — Addendum Note (Signed)
 Addended by: LADONNA INOCENTE SAILOR on: 04/01/2024 05:13 PM   Modules accepted: Orders

## 2024-04-01 NOTE — Progress Notes (Signed)
   Subjective:    Patient ID: [name] [name], female    DOB: 1956/03/22, 68 y.o.   MRN: [id number]  HPI Here for several issues. First she thinks the Cymbalta  is too strong. Lately she says she feels no emotions, and she does not get sad or angry about things. She does sleep well. She says her anxiety has increased however, and she asks to increase the Xanax  so she can take it during the day again (like she did in the past). Third she asks about trying a GLP-1 product to help her lose weight. Her A1c in July was 6.7%, so she now has type 2 diabetes. Lastly she has tried Oxybutynin  for OAB, but this has not helped at all.    Review of Systems  Constitutional: Negative.   Respiratory: Negative.    Genitourinary:  Positive for frequency.  Psychiatric/Behavioral:  Positive for dysphoric mood. The patient is nervous/anxious.        Objective:   Physical Exam Constitutional:      Appearance: She is obese.  Cardiovascular:     Rate and Rhythm: Normal rate and regular rhythm.     Pulses: Normal pulses.     Heart sounds: Normal heart sounds.  Pulmonary:     Effort: Pulmonary effort is normal.     Breath sounds: Normal breath sounds.  Neurological:     Mental Status: She is alert.  Psychiatric:        Mood and Affect: Mood normal.        Behavior: Behavior normal.        Thought Content: Thought content normal.           Assessment & Plan:  For her Bipolar disorder, we will decrease the Cymbalta  to 20 mg daily. For the anxiety we wil increase the Xanax  to 2 mg TID as needed. For the obesity and type 2 diabetes, we will start her on Ozempic 0.25 mg weekly. Report back in 4 weeks. For the OAB, stop Oxybutynin  and try Solifenacin 10 mg daily.  Garnette Olmsted, MD

## 2024-04-03 ENCOUNTER — Other Ambulatory Visit (HOSPITAL_COMMUNITY): Payer: Self-pay

## 2024-04-03 ENCOUNTER — Telehealth: Payer: Self-pay

## 2024-04-03 NOTE — Telephone Encounter (Signed)
 Pharmacy Patient Advocate Encounter   Received notification from Onbase that prior authorization for Ozempic 2 is required/requested.   Insurance verification completed.   The patient is insured through [facility] ADVANTAGE/RX ADVANCE.   Per test claim: PA required; PA submitted to above mentioned insurance via Latent Key/confirmation #/EOC [id number] Status is pending

## 2024-04-03 NOTE — Telephone Encounter (Signed)
 Pharmacy Patient Advocate Encounter  Received notification from [facility] ADVANTAGE/RX ADVANCE that Prior Authorization for Ozempic 2 has been APPROVED from 04/03/24 to 04/03/25. Ran test claim, Copay is $47.00. This test claim was processed through [facility]- copay amounts may vary at other pharmacies due to pharmacy/plan contracts, or as the patient moves through the different stages of their insurance plan.   PA #/Case ID/Reference #: # [id number]

## 2024-04-16 DIAGNOSIS — F332 Major depressive disorder, recurrent severe without psychotic features: Secondary | ICD-10-CM | POA: Diagnosis not present

## 2024-04-20 ENCOUNTER — Ambulatory Visit (HOSPITAL_BASED_OUTPATIENT_CLINIC_OR_DEPARTMENT_OTHER): Payer: Self-pay | Admitting: Pulmonary Disease

## 2024-04-20 NOTE — Telephone Encounter (Signed)
 FYI Only or Action Required?: FYI only for provider: ED advised.  Patient is followed in Pulmonology for Dr. Jude, last seen on 03/05/2024 by [name] Harden GAILS, MD.  Called Nurse Triage reporting Chest Pain.  Symptoms began ongoing and worsening.  Interventions attempted: Nothing.  Symptoms are: gradually worsening.  E2C2 Pulmonary Triage - Initial Assessment Questions "Chief Complaint (e.g., cough, sob, wheezing, fever, chills, sweat or additional symptoms) *Go to specific symptom protocol after initial questions. ILD (interstitial lung disease) (HCC)   "How long have symptoms been present?" Ongoing for months and worsening  Have you tested for COVID or Flu? Note: If not, ask patient if a home test can be taken. If so, instruct patient to call back for positive results. No  MEDICINES:   "Have you used any OTC meds to help with symptoms?" No If yes, ask "What medications?" na  "Have you used your inhalers/maintenance medication?" No If yes, "What medications?" na  If inhaler, ask "How many puffs and how often?" Note: Review instructions on medication in the chart. na  OXYGEN : "Do you wear supplemental oxygen ?" Yes If yes, "How many liters are you supposed to use?" 3L [place]  "Do you monitor your oxygen  levels?" No If yes, What is your reading (oxygen  level) today? na  What is your usual oxygen  saturation reading?  (Note: Pulmonary O2 sats should be 90% or greater)     Triage Disposition: No disposition on file.  Patient/caregiver understands and will follow disposition?:     Copied from CRM [id number]. Topic: Clinical - Red Word Triage >> Apr 20, 2024  4:25 PM [name] wrote: Red Word that prompted transfer to Nurse Triage: Patient complains of lung pain, chest pain, and pain above and below the bra line. Patient would like medical advisement. Answer Assessment - Initial Assessment Questions 1. LOCATION: Where does it hurt?       Right and left chest pain - above  and below bra line on front and back of chest  2. RADIATION: Does the pain go anywhere else? (e.g., into neck, jaw, arms, back)     Right back feels like knife stabbing in it 3. ONSET: When did the chest pain begin? (Minutes, hours or days)      Ongoing  4. PATTERN: Does the pain come and go, or has it been constant since it started?  Does it get worse with exertion?      Constant  5. DURATION: How long does it last (e.g., seconds, minutes, hours)     na 6. SEVERITY: How bad is the pain?  (e.g., Scale 1-10; mild, moderate, or severe)     8/10 7. CARDIAC RISK FACTORS: Do you have any history of heart problems or risk factors for heart disease? (e.g., angina, prior heart attack; diabetes, high blood pressure, high cholesterol, smoker, or strong family history of heart disease)     na 8. PULMONARY RISK FACTORS: Do you have any history of lung disease?  (e.g., blood clots in lung, asthma, emphysema, birth control pills)     ues 9. CAUSE: What do you think is causing the chest pain?     Lung disease 10. OTHER SYMPTOMS: Do you have any other symptoms? (e.g., dizziness, nausea, vomiting, sweating, fever, difficulty breathing, cough)       SOB at times, nausea, mucous - yellowish-white, cough,  11. PREGNANCY: Is there any chance you are pregnant? When was your last menstrual period?       Na Been taking ibuprofen  and  Goody powders/daily.  Coughing leads to incontinence, lightheaded, dizzy, room spins,  Protocols used: Chest Pain-A-AH

## 2024-04-21 NOTE — Telephone Encounter (Signed)
 Pt sent to ED

## 2024-04-27 ENCOUNTER — Ambulatory Visit: Payer: Self-pay

## 2024-04-27 ENCOUNTER — Ambulatory Visit (HOSPITAL_BASED_OUTPATIENT_CLINIC_OR_DEPARTMENT_OTHER): Admitting: Pulmonary Disease

## 2024-04-27 NOTE — Telephone Encounter (Signed)
 FYI Only or Action Required?: Action required by provider: Requesting medication.  Patient was last seen in primary care on 04/01/2024 by Johnny Garnette LABOR, MD.  Called Nurse Triage reporting Facial Pain.  Symptoms began several days ago.  Interventions attempted: Rest, hydration, or home remedies.  Symptoms are: stable.  Triage Disposition: See PCP When Office is Open (Within 3 Days)  Patient/caregiver understands and will follow disposition?: Yes Reason for Disposition  [1] Using nasal washes and pain medicine > 24 hours AND [2] sinus pain (around cheekbone or eye) persists  Answer Assessment - Initial Assessment Questions States has this issue every year around this time. Has not taken anything OTC. Used Albuterol  Inhaler, has interstitial lung disease. Patient refusing appointment and wants something to be called in to [facility] on file. Please advise.   1. LOCATION: Where does it hurt?      Nose, eyes, headache  2. ONSET: When did the sinus pain start?  (e.g., hours, days)      3 days ago  3. NASAL CONGESTION: Is the nose blocked? If Yes, ask: Can you open it or must you breathe through your mouth?     Yes, and crusted  4. NASAL DISCHARGE: Do you have discharge from your nose? If so ask, What color?     Dries up before gets to blow nose  5. FEVER: Do you have a fever? If Yes, ask: What is it, how was it measured, and when did it start?      Unsure, but does not feel as thought there is a fever  8. OTHER SYMPTOMS: Do you have any other symptoms? (e.g., sore throat, cough, earache, difficulty breathing)     Chest feels like a concrete, so congested, fatigue  Protocols used: Sinus Pain or Congestion-A-AH  Copied from CRM #[id number]. Topic: Clinical - Red Word Triage >> Apr 27, 2024  1:08 PM Victoria A wrote: [facility] that prompted transfer to Nurse Triage: Patient thinks she has Sinus Infection that she is having pain above her eye and headaches and  congestion

## 2024-05-07 DIAGNOSIS — R053 Chronic cough: Secondary | ICD-10-CM | POA: Diagnosis not present

## 2024-05-07 DIAGNOSIS — R0981 Nasal congestion: Secondary | ICD-10-CM | POA: Diagnosis not present

## 2024-05-07 DIAGNOSIS — R82998 Other abnormal findings in urine: Secondary | ICD-10-CM | POA: Diagnosis not present

## 2024-05-09 ENCOUNTER — Other Ambulatory Visit: Payer: Self-pay | Admitting: Family Medicine

## 2024-05-21 DIAGNOSIS — R351 Nocturia: Secondary | ICD-10-CM | POA: Diagnosis not present

## 2024-05-21 DIAGNOSIS — N3946 Mixed incontinence: Secondary | ICD-10-CM | POA: Diagnosis not present

## 2024-05-21 DIAGNOSIS — R350 Frequency of micturition: Secondary | ICD-10-CM | POA: Diagnosis not present

## 2024-06-05 ENCOUNTER — Telehealth: Payer: Self-pay

## 2024-06-05 NOTE — Progress Notes (Signed)
" ° °  06/05/2024  Patient ID: [name] [name], female   DOB: November 28, 1955, 68 y.o.   MRN: [id number]  Pharmacy Quality Measure Review  This patient is appearing on a report for being at risk of failing the adherence measure for cholesterol (statin) medications this calendar year.   Medication: Rosuvastatin  40mg  Last fill date: 03/03/24 for 90 day supply  Spoke with patient via phone. She plans to contact pharmacy to request a refill today.  She also notes that she is undergoing follow up to determine if she has any bladder cancer with [facility]. She would like this forwarded to PCP attention so he is aware.  Jon VEAR Lindau, PharmD Clinical Pharmacist [phone]   "

## 2024-07-14 ENCOUNTER — Telehealth (HOSPITAL_BASED_OUTPATIENT_CLINIC_OR_DEPARTMENT_OTHER): Payer: Self-pay | Admitting: Pulmonary Disease

## 2024-07-14 NOTE — Telephone Encounter (Signed)
 Copied from CRM #[id number]. Topic: Appointments - Scheduling Inquiry for Clinic >> Jul 13, 2024 12:17 PM Ismael A wrote: Reason for CRM: patient wants to reschedule OV for 07/16/24 for a virtual appt instead as she is out of water  and due to weather  Dr Jude, patient is an Inspire patient. Can her appointment be virtual?

## 2024-07-16 ENCOUNTER — Ambulatory Visit (HOSPITAL_BASED_OUTPATIENT_CLINIC_OR_DEPARTMENT_OTHER): Admitting: Pulmonary Disease

## 2024-07-31 ENCOUNTER — Ambulatory Visit (HOSPITAL_BASED_OUTPATIENT_CLINIC_OR_DEPARTMENT_OTHER): Admitting: Pulmonary Disease

## 2024-09-02 ENCOUNTER — Ambulatory Visit
